# Patient Record
Sex: Male | Born: 1935
Health system: Southern US, Community
[De-identification: ages and names within clinical notes are randomized; demographics above are authoritative.]

## PROBLEM LIST (undated history)

## (undated) DIAGNOSIS — K589 Irritable bowel syndrome without diarrhea: Secondary | ICD-10-CM

## (undated) DIAGNOSIS — Z87898 Personal history of other specified conditions: Secondary | ICD-10-CM

## (undated) DIAGNOSIS — N320 Bladder-neck obstruction: Secondary | ICD-10-CM

## (undated) DIAGNOSIS — D649 Anemia, unspecified: Secondary | ICD-10-CM

## (undated) DIAGNOSIS — G629 Polyneuropathy, unspecified: Secondary | ICD-10-CM

## (undated) DIAGNOSIS — Z8719 Personal history of other diseases of the digestive system: Secondary | ICD-10-CM

## (undated) DIAGNOSIS — E538 Deficiency of other specified B group vitamins: Secondary | ICD-10-CM

## (undated) DIAGNOSIS — M8580 Other specified disorders of bone density and structure, unspecified site: Secondary | ICD-10-CM

## (undated) DIAGNOSIS — C801 Malignant (primary) neoplasm, unspecified: Secondary | ICD-10-CM

## (undated) DIAGNOSIS — J310 Chronic rhinitis: Secondary | ICD-10-CM

## (undated) DIAGNOSIS — F32A Depression, unspecified: Secondary | ICD-10-CM

## (undated) DIAGNOSIS — K219 Gastro-esophageal reflux disease without esophagitis: Secondary | ICD-10-CM

## (undated) DIAGNOSIS — I2510 Atherosclerotic heart disease of native coronary artery without angina pectoris: Secondary | ICD-10-CM

## (undated) DIAGNOSIS — E785 Hyperlipidemia, unspecified: Secondary | ICD-10-CM

## (undated) DIAGNOSIS — M1990 Unspecified osteoarthritis, unspecified site: Secondary | ICD-10-CM

## (undated) DIAGNOSIS — F329 Major depressive disorder, single episode, unspecified: Secondary | ICD-10-CM

## (undated) DIAGNOSIS — G2581 Restless legs syndrome: Secondary | ICD-10-CM

## (undated) HISTORY — DX: Atherosclerotic heart disease of native coronary artery without angina pectoris: I25.10

## (undated) HISTORY — PX: CHOLECYSTECTOMY: SHX55

## (undated) HISTORY — DX: Restless legs syndrome: G25.81

## (undated) HISTORY — DX: Polyneuropathy, unspecified: G62.9

## (undated) HISTORY — PX: CARPAL TUNNEL RELEASE: SHX101

## (undated) HISTORY — DX: Other specified disorders of bone density and structure, unspecified site: M85.80

## (undated) HISTORY — PX: TONSILLECTOMY: SUR1361

## (undated) HISTORY — DX: Irritable bowel syndrome, unspecified: K58.9

## (undated) HISTORY — DX: Chronic rhinitis: J31.0

## (undated) HISTORY — PX: APPENDECTOMY: SHX54

## (undated) HISTORY — DX: Deficiency of other specified B group vitamins: E53.8

---
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
## 2009-03-02 ENCOUNTER — Encounter: Payer: Self-pay | Admitting: Internal Medicine

## 2009-03-15 ENCOUNTER — Ambulatory Visit: Payer: Self-pay | Admitting: Internal Medicine

## 2009-03-15 ENCOUNTER — Inpatient Hospital Stay (HOSPITAL_COMMUNITY): Admission: EM | Admit: 2009-03-15 | Discharge: 2009-03-17 | Payer: Self-pay | Admitting: Emergency Medicine

## 2009-04-10 ENCOUNTER — Ambulatory Visit: Payer: Self-pay | Admitting: Internal Medicine

## 2009-04-10 DIAGNOSIS — E782 Mixed hyperlipidemia: Secondary | ICD-10-CM | POA: Insufficient documentation

## 2009-04-10 DIAGNOSIS — I10 Essential (primary) hypertension: Secondary | ICD-10-CM

## 2009-04-10 DIAGNOSIS — I2510 Atherosclerotic heart disease of native coronary artery without angina pectoris: Secondary | ICD-10-CM | POA: Insufficient documentation

## 2009-04-10 HISTORY — DX: Mixed hyperlipidemia: E78.2

## 2009-04-10 HISTORY — DX: Essential (primary) hypertension: I10

## 2009-04-27 ENCOUNTER — Ambulatory Visit: Payer: Self-pay | Admitting: Internal Medicine

## 2009-05-01 ENCOUNTER — Ambulatory Visit: Payer: Self-pay | Admitting: Internal Medicine

## 2009-05-25 LAB — CONVERTED CEMR LAB
HDL: 31.2 mg/dL — ABNORMAL LOW (ref >39.00)
LDL Cholesterol: 77 mg/dL (ref 0–99)

## 2009-07-20 ENCOUNTER — Encounter: Payer: Self-pay | Admitting: Internal Medicine

## 2009-09-21 ENCOUNTER — Encounter: Payer: Self-pay | Admitting: Internal Medicine

## 2009-10-12 ENCOUNTER — Ambulatory Visit: Payer: Self-pay | Admitting: Internal Medicine

## 2009-10-12 DIAGNOSIS — R42 Dizziness and giddiness: Secondary | ICD-10-CM | POA: Insufficient documentation

## 2010-02-02 ENCOUNTER — Encounter: Admission: RE | Admit: 2010-02-02 | Discharge: 2010-02-02 | Payer: Self-pay | Admitting: Neurology

## 2010-07-13 NOTE — Letter (Signed)
Summary: [facility] Office Note  [facility] Office Note   Imported By: Roderic Ovens 11/05/2009 15:19:46  _____________________________________________________________________  External Attachment:    Type:   Image     Comment:   External Document

## 2010-07-13 NOTE — Assessment & Plan Note (Signed)
Summary: Ryan Humphrey   Visit Type:  6 mos f/u Primary Provider:  Dr Felipa Furnace  CC:  shortness of breath.  History of Present Illness:  Patient is a 77 year old with a history of mild CAD by cath and dyslipidemia.  I recently saw him in the hospital.  He was admitted with chest pain, shoulder pain.  Cardiac catheterization was done that showed no critical lesions. Since he was seen he denies chest pain.  Does get short of breath when climbing stairs to attic.  Not at other times.  No organized exercise.  Busy with wife who has had a CVA and also has dementia. Note he had an episode of vertigo last night that was severe.  Had one other spell in past.  Seen by J. Love for this and other issues.  No other testing done.  Feels better now.    Problems Prior to Update: 1)  Hypertension, Benign  (ICD-401.1) 2)  Hyperlipidemia-mixed  (ICD-272.4) 3)  Coronary Atherosclerosis Native Coronary Artery  (ICD-414.01)  Current Medications (verified): 1)  Fosamax 70 Mg Tabs (Alendronate Sodium) .Marland Kitchen.. 1 Tab Q Weekly 2)  Aspirin 325 Mg  Tabs (Aspirin) .Marland Kitchen.. 1 Tab Once Daily 3)  Calcium Carbonate   Powd (Calcium Carbonate) .Marland Kitchen.. 1 Tab Two Times A Day 4)  Multivitamins   Tabs (Multiple Vitamin) .Marland Kitchen.. 1 Tab Once Daily 5)  Diclofenac Sodium 50 Mg Tbec (Diclofenac Sodium) .Marland Kitchen.. 1 Tab Two Times A Day 6)  Gabapentin 300 Mg Caps (Gabapentin) .... 2 Tablets Evrey Night 7)  Niaspan 1000 Mg Cr-Tabs (Niacin (Antihyperlipidemic)) .Marland Kitchen.. 1 Tab Once Daily 8)  Omeprazole 20 Mg Cpdr (Omeprazole) .Marland Kitchen.. 1 Atb Once Daily 9)  Oxybutynin Chloride 5 Mg Xr24h-Tab (Oxybutynin Chloride) .Marland Kitchen.. 1 Tab  Once Daily 10)  Simvastatin 20 Mg Tabs (Simvastatin) .Marland Kitchen.. 1 Tab Once Daily 11)  Venlafaxine Hcl 75 Mg Tabs (Venlafaxine Hcl) .... (Effexor) 1 Tab Two Times A Day 12)  Vitamin D 400 Unit  Tabs (Cholecalciferol) .... 2 Tabs Once Daily = 800iu Daily 13)  Nitrostat 0.4 Mg Subl (Nitroglycerin) .Marland Kitchen.. 1 Tablet Under Tongue At Onset of Chest Pain; You  May Repeat Every 5 Minutes For Up To 3 Doses.  Allergies: 1)  ! Pcn  Past History:  Past medical, surgical, family and social histories (including risk factors) reviewed, and no changes noted (except as noted below).  Past Medical History: CAD  Dyslipidemia Prostate cancer neuropathy vertigo  Past Surgical History: Reviewed history from 04/10/2009 and no changes required.   Cholecystectomy. 2005 Prostate surgery 1998 Carpal tunnel repair bilaterally 1991 Anal fissure surgery 1980 T & A  Family History: Reviewed history from 04/10/2009 and no changes required.  Mother died of old age 31.  Father died of angina, his   side of the family was significant for CAD.  The patient has 2 brothers who are alive and well  Social History: Reviewed history from 04/10/2009 and no changes required.  The patient is married, lives with his wife.  He is   retired.  Quit tobacco after 20 years of less than one pack per day   history.  Drinks occasionally.  No drug use.      Physical Exam  Additional Exam:  Patient is in NAD HEENT:  Normocephalic, atraumatic. EOMI, PERRLA.  Neck: JVP is normal. No thyromegaly. No bruits.  Lungs: clear to auscultation. No rales no wheezes.  Heart: Regular rate and rhythm. Normal S1, S2. No S3.   No significant murmurs. PMI  not displaced.  Abdomen:  Supple, nontender. Normal bowel sounds. No masses. No hepatomegaly.  Extremities:   Good distal pulses throughout. No lower extremity edema.  Musculoskeletal :moving all extremities.  Neuro:   alert and oriented x3.    Impression & Recommendations:  Problem # 1:  CORONARY ATHEROSCLEROSIS NATIVE CORONARY ARTERY (ICD-414.01) Doing well.  Mild disease by cath last fall.  Continue ASA and risk factor modification.  Encouraged him to stay active.  Problem # 2:  HYPERLIPIDEMIA-MIXED (ICD-272.4) Just had labs done about 4 wks ago at the [place].  will send results. His updated medication list for this problem  includes:    Niaspan 1000 Mg Cr-tabs (Niacin (antihyperlipidemic)) .Marland Kitchen... 1 tab once daily    Simvastatin 20 Mg Tabs (Simvastatin) .Marland Kitchen... 1 tab once daily  Problem # 3:  INTERMITTENT VERTIGO (ICD-780.4) will get records from J. Love.  Due to see him in August.  Patient Instructions: 1)  Will be available as needed.  Will get back withj you regarding lipids.  Appended Document: f41m/jss BP 140/ Pulse:  76.

## 2010-09-16 LAB — LIPID PANEL
Cholesterol: 148 mg/dL (ref 0–200)
HDL: 30 mg/dL — ABNORMAL LOW (ref >39)
LDL Cholesterol: 85 mg/dL (ref 0–99)

## 2010-09-16 LAB — CARDIAC PANEL(CRET KIN+CKTOT+MB+TROPI)
CK, MB: 1.3 ng/mL (ref 0.3–4.0)
Relative Index: INVALID (ref 0.0–2.5)
Total CK: 93 U/L (ref 7–232)
Troponin I: 0.01 ng/mL (ref 0.00–0.06)

## 2010-09-16 LAB — CBC
HCT: 38.5 % — ABNORMAL LOW (ref 39.0–52.0)
Hemoglobin: 13.1 g/dL (ref 13.0–17.0)
MCHC: 34 g/dL (ref 30.0–36.0)
Platelets: 142 10*3/uL — ABNORMAL LOW (ref 150–400)
RDW: 12.5 % (ref 11.5–15.5)
WBC: 6.2 10*3/uL (ref 4.0–10.5)

## 2010-09-16 LAB — BASIC METABOLIC PANEL
Calcium: 8.9 mg/dL (ref 8.4–10.5)
Chloride: 105 mEq/L (ref 96–112)
Creatinine, Ser: 1.06 mg/dL (ref 0.4–1.5)
GFR calc non Af Amer: >60 mL/min (ref >60)
Sodium: 141 mEq/L (ref 135–145)

## 2010-09-16 LAB — POCT I-STAT, CHEM 8
Calcium, Ion: 1.05 mmol/L — ABNORMAL LOW (ref 1.12–1.32)
Glucose, Bld: 82 mg/dL (ref 70–99)
Hemoglobin: 13.6 g/dL (ref 13.0–17.0)
Potassium: 4.3 mEq/L (ref 3.5–5.1)
Sodium: 140 mEq/L (ref 135–145)

## 2010-09-16 LAB — D-DIMER, QUANTITATIVE: D-Dimer, Quant: 0.31 ug/mL-FEU (ref 0.00–0.48)

## 2010-09-16 LAB — PROTIME-INR
INR: 1 (ref 0.00–1.49)
Prothrombin Time: 12.9 seconds (ref 11.6–15.2)

## 2010-09-16 LAB — CK TOTAL AND CKMB (NOT AT ARMC): Relative Index: 2 (ref 0.0–2.5)

## 2010-09-16 LAB — POCT CARDIAC MARKERS: Troponin i, poc: <0.05 ng/mL (ref 0.00–0.09)

## 2010-09-16 LAB — DIFFERENTIAL
Lymphs Abs: 1.1 10*3/uL (ref 0.7–4.0)
Monocytes Absolute: 0.8 10*3/uL (ref 0.1–1.0)
Neutro Abs: 4.1 10*3/uL (ref 1.7–7.7)

## 2010-09-16 LAB — TROPONIN I: Troponin I: 0.01 ng/mL (ref 0.00–0.06)

## 2010-09-17 ENCOUNTER — Emergency Department (HOSPITAL_COMMUNITY): Payer: Medicare Other

## 2010-09-17 ENCOUNTER — Emergency Department (HOSPITAL_COMMUNITY)
Admission: EM | Admit: 2010-09-17 | Discharge: 2010-09-17 | Disposition: A | Discharge status: Home / Self Care | Payer: Medicare Other | Attending: Emergency Medicine | Admitting: Emergency Medicine

## 2010-09-17 DIAGNOSIS — Z79899 Other long term (current) drug therapy: Secondary | ICD-10-CM | POA: Insufficient documentation

## 2010-09-17 DIAGNOSIS — Z7982 Long term (current) use of aspirin: Secondary | ICD-10-CM | POA: Insufficient documentation

## 2010-09-17 DIAGNOSIS — R42 Dizziness and giddiness: Secondary | ICD-10-CM | POA: Insufficient documentation

## 2010-09-17 DIAGNOSIS — Z8546 Personal history of malignant neoplasm of prostate: Secondary | ICD-10-CM | POA: Insufficient documentation

## 2010-09-17 LAB — BASIC METABOLIC PANEL WITH GFR
BUN: 22 mg/dL (ref 6–23)
CO2: 23 meq/L (ref 19–32)
Calcium: 8.8 mg/dL (ref 8.4–10.5)
Chloride: 105 meq/L (ref 96–112)
Creatinine, Ser: 1.02 mg/dL (ref 0.4–1.5)
GFR calc non Af Amer: >60 mL/min (ref >60)

## 2010-09-17 LAB — BASIC METABOLIC PANEL
GFR calc Af Amer: >60 mL/min (ref >60)
Glucose, Bld: 182 mg/dL — ABNORMAL HIGH (ref 70–99)
Potassium: 3.4 mEq/L — ABNORMAL LOW (ref 3.5–5.1)
Sodium: 136 mEq/L (ref 135–145)

## 2010-09-17 LAB — CBC
HCT: 35.5 % — ABNORMAL LOW (ref 39.0–52.0)
Hemoglobin: 12.2 g/dL — ABNORMAL LOW (ref 13.0–17.0)
MCH: 31.9 pg (ref 26.0–34.0)
MCHC: 34.4 g/dL (ref 30.0–36.0)
MCV: 92.9 fL (ref 78.0–100.0)
Platelets: 122 10*3/uL — ABNORMAL LOW (ref 150–400)
RBC: 3.82 MIL/uL — ABNORMAL LOW (ref 4.22–5.81)
RDW: 12.5 % (ref 11.5–15.5)
WBC: 7 K/uL (ref 4.0–10.5)

## 2010-09-17 LAB — URINALYSIS, ROUTINE W REFLEX MICROSCOPIC
Bilirubin Urine: NEGATIVE
Glucose, UA: NEGATIVE mg/dL
Hgb urine dipstick: NEGATIVE
Ketones, ur: 15 mg/dL — AB
Nitrite: NEGATIVE
Protein, ur: NEGATIVE mg/dL
Specific Gravity, Urine: 1.025 (ref 1.005–1.030)
Urobilinogen, UA: 1 mg/dL (ref 0.0–1.0)
pH: 6 (ref 5.0–8.0)

## 2010-09-17 LAB — DIFFERENTIAL
Basophils Absolute: 0 K/uL (ref 0.0–0.1)
Basophils Relative: 0 % (ref 0–1)
Eosinophils Absolute: 0 10*3/uL (ref 0.0–0.7)
Eosinophils Relative: 1 % (ref 0–5)
Lymphocytes Relative: 11 % — ABNORMAL LOW (ref 12–46)
Lymphs Abs: 0.8 10*3/uL (ref 0.7–4.0)
Monocytes Absolute: 0.3 10*3/uL (ref 0.1–1.0)
Monocytes Relative: 4 % (ref 3–12)
Neutro Abs: 5.9 K/uL (ref 1.7–7.7)
Neutrophils Relative %: 84 % — ABNORMAL HIGH (ref 43–77)

## 2010-09-17 LAB — POCT CARDIAC MARKERS
CKMB, poc: <1 ng/mL — ABNORMAL LOW (ref 1.0–8.0)
Myoglobin, poc: 55.7 ng/mL (ref 12–200)
Myoglobin, poc: 68 ng/mL (ref 12–200)
Troponin i, poc: <0.05 ng/mL (ref 0.00–0.09)

## 2011-03-28 DIAGNOSIS — N320 Bladder-neck obstruction: Secondary | ICD-10-CM

## 2011-03-28 DIAGNOSIS — C61 Malignant neoplasm of prostate: Secondary | ICD-10-CM | POA: Insufficient documentation

## 2011-03-28 HISTORY — DX: Bladder-neck obstruction: N32.0

## 2011-08-04 DIAGNOSIS — K589 Irritable bowel syndrome without diarrhea: Secondary | ICD-10-CM | POA: Diagnosis not present

## 2011-08-15 DIAGNOSIS — M48061 Spinal stenosis, lumbar region without neurogenic claudication: Secondary | ICD-10-CM | POA: Diagnosis not present

## 2011-08-15 DIAGNOSIS — M999 Biomechanical lesion, unspecified: Secondary | ICD-10-CM | POA: Diagnosis not present

## 2011-08-15 DIAGNOSIS — M47814 Spondylosis without myelopathy or radiculopathy, thoracic region: Secondary | ICD-10-CM | POA: Diagnosis not present

## 2011-08-15 DIAGNOSIS — M5430 Sciatica, unspecified side: Secondary | ICD-10-CM | POA: Diagnosis not present

## 2011-08-16 DIAGNOSIS — M47814 Spondylosis without myelopathy or radiculopathy, thoracic region: Secondary | ICD-10-CM | POA: Diagnosis not present

## 2011-08-16 DIAGNOSIS — M48061 Spinal stenosis, lumbar region without neurogenic claudication: Secondary | ICD-10-CM | POA: Diagnosis not present

## 2011-08-16 DIAGNOSIS — M999 Biomechanical lesion, unspecified: Secondary | ICD-10-CM | POA: Diagnosis not present

## 2011-08-16 DIAGNOSIS — M5430 Sciatica, unspecified side: Secondary | ICD-10-CM | POA: Diagnosis not present

## 2011-08-17 DIAGNOSIS — L719 Rosacea, unspecified: Secondary | ICD-10-CM | POA: Diagnosis not present

## 2011-08-17 DIAGNOSIS — H2510 Age-related nuclear cataract, unspecified eye: Secondary | ICD-10-CM | POA: Diagnosis not present

## 2011-08-22 DIAGNOSIS — M5430 Sciatica, unspecified side: Secondary | ICD-10-CM | POA: Diagnosis not present

## 2011-08-22 DIAGNOSIS — M48061 Spinal stenosis, lumbar region without neurogenic claudication: Secondary | ICD-10-CM | POA: Diagnosis not present

## 2011-08-22 DIAGNOSIS — M999 Biomechanical lesion, unspecified: Secondary | ICD-10-CM | POA: Diagnosis not present

## 2011-08-22 DIAGNOSIS — M47814 Spondylosis without myelopathy or radiculopathy, thoracic region: Secondary | ICD-10-CM | POA: Diagnosis not present

## 2011-08-24 DIAGNOSIS — M999 Biomechanical lesion, unspecified: Secondary | ICD-10-CM | POA: Diagnosis not present

## 2011-08-24 DIAGNOSIS — M48061 Spinal stenosis, lumbar region without neurogenic claudication: Secondary | ICD-10-CM | POA: Diagnosis not present

## 2011-08-24 DIAGNOSIS — M5430 Sciatica, unspecified side: Secondary | ICD-10-CM | POA: Diagnosis not present

## 2011-08-24 DIAGNOSIS — M47814 Spondylosis without myelopathy or radiculopathy, thoracic region: Secondary | ICD-10-CM | POA: Diagnosis not present

## 2011-08-26 DIAGNOSIS — M48061 Spinal stenosis, lumbar region without neurogenic claudication: Secondary | ICD-10-CM | POA: Diagnosis not present

## 2011-08-26 DIAGNOSIS — M47814 Spondylosis without myelopathy or radiculopathy, thoracic region: Secondary | ICD-10-CM | POA: Diagnosis not present

## 2011-08-26 DIAGNOSIS — M5430 Sciatica, unspecified side: Secondary | ICD-10-CM | POA: Diagnosis not present

## 2011-08-26 DIAGNOSIS — M999 Biomechanical lesion, unspecified: Secondary | ICD-10-CM | POA: Diagnosis not present

## 2011-08-29 DIAGNOSIS — M999 Biomechanical lesion, unspecified: Secondary | ICD-10-CM | POA: Diagnosis not present

## 2011-08-29 DIAGNOSIS — M47814 Spondylosis without myelopathy or radiculopathy, thoracic region: Secondary | ICD-10-CM | POA: Diagnosis not present

## 2011-08-29 DIAGNOSIS — M5430 Sciatica, unspecified side: Secondary | ICD-10-CM | POA: Diagnosis not present

## 2011-08-29 DIAGNOSIS — M48061 Spinal stenosis, lumbar region without neurogenic claudication: Secondary | ICD-10-CM | POA: Diagnosis not present

## 2011-08-31 DIAGNOSIS — M48061 Spinal stenosis, lumbar region without neurogenic claudication: Secondary | ICD-10-CM | POA: Diagnosis not present

## 2011-08-31 DIAGNOSIS — M47814 Spondylosis without myelopathy or radiculopathy, thoracic region: Secondary | ICD-10-CM | POA: Diagnosis not present

## 2011-08-31 DIAGNOSIS — M999 Biomechanical lesion, unspecified: Secondary | ICD-10-CM | POA: Diagnosis not present

## 2011-08-31 DIAGNOSIS — M5430 Sciatica, unspecified side: Secondary | ICD-10-CM | POA: Diagnosis not present

## 2011-09-02 DIAGNOSIS — M48061 Spinal stenosis, lumbar region without neurogenic claudication: Secondary | ICD-10-CM | POA: Diagnosis not present

## 2011-09-02 DIAGNOSIS — M5430 Sciatica, unspecified side: Secondary | ICD-10-CM | POA: Diagnosis not present

## 2011-09-02 DIAGNOSIS — M999 Biomechanical lesion, unspecified: Secondary | ICD-10-CM | POA: Diagnosis not present

## 2011-09-02 DIAGNOSIS — M47814 Spondylosis without myelopathy or radiculopathy, thoracic region: Secondary | ICD-10-CM | POA: Diagnosis not present

## 2011-09-05 DIAGNOSIS — M5430 Sciatica, unspecified side: Secondary | ICD-10-CM | POA: Diagnosis not present

## 2011-09-05 DIAGNOSIS — M47814 Spondylosis without myelopathy or radiculopathy, thoracic region: Secondary | ICD-10-CM | POA: Diagnosis not present

## 2011-09-05 DIAGNOSIS — M999 Biomechanical lesion, unspecified: Secondary | ICD-10-CM | POA: Diagnosis not present

## 2011-09-05 DIAGNOSIS — M48061 Spinal stenosis, lumbar region without neurogenic claudication: Secondary | ICD-10-CM | POA: Diagnosis not present

## 2011-09-07 DIAGNOSIS — M999 Biomechanical lesion, unspecified: Secondary | ICD-10-CM | POA: Diagnosis not present

## 2011-09-07 DIAGNOSIS — M48061 Spinal stenosis, lumbar region without neurogenic claudication: Secondary | ICD-10-CM | POA: Diagnosis not present

## 2011-09-07 DIAGNOSIS — M5430 Sciatica, unspecified side: Secondary | ICD-10-CM | POA: Diagnosis not present

## 2011-09-07 DIAGNOSIS — M47814 Spondylosis without myelopathy or radiculopathy, thoracic region: Secondary | ICD-10-CM | POA: Diagnosis not present

## 2011-09-12 DIAGNOSIS — M5430 Sciatica, unspecified side: Secondary | ICD-10-CM | POA: Diagnosis not present

## 2011-09-12 DIAGNOSIS — M47814 Spondylosis without myelopathy or radiculopathy, thoracic region: Secondary | ICD-10-CM | POA: Diagnosis not present

## 2011-09-12 DIAGNOSIS — M48061 Spinal stenosis, lumbar region without neurogenic claudication: Secondary | ICD-10-CM | POA: Diagnosis not present

## 2011-09-12 DIAGNOSIS — M999 Biomechanical lesion, unspecified: Secondary | ICD-10-CM | POA: Diagnosis not present

## 2011-09-14 DIAGNOSIS — M999 Biomechanical lesion, unspecified: Secondary | ICD-10-CM | POA: Diagnosis not present

## 2011-09-14 DIAGNOSIS — M48061 Spinal stenosis, lumbar region without neurogenic claudication: Secondary | ICD-10-CM | POA: Diagnosis not present

## 2011-09-14 DIAGNOSIS — M47814 Spondylosis without myelopathy or radiculopathy, thoracic region: Secondary | ICD-10-CM | POA: Diagnosis not present

## 2011-09-14 DIAGNOSIS — M5430 Sciatica, unspecified side: Secondary | ICD-10-CM | POA: Diagnosis not present

## 2011-09-19 DIAGNOSIS — M5430 Sciatica, unspecified side: Secondary | ICD-10-CM | POA: Diagnosis not present

## 2011-09-19 DIAGNOSIS — M48061 Spinal stenosis, lumbar region without neurogenic claudication: Secondary | ICD-10-CM | POA: Diagnosis not present

## 2011-09-19 DIAGNOSIS — M47814 Spondylosis without myelopathy or radiculopathy, thoracic region: Secondary | ICD-10-CM | POA: Diagnosis not present

## 2011-09-19 DIAGNOSIS — M999 Biomechanical lesion, unspecified: Secondary | ICD-10-CM | POA: Diagnosis not present

## 2011-09-26 DIAGNOSIS — N320 Bladder-neck obstruction: Secondary | ICD-10-CM | POA: Diagnosis not present

## 2011-09-26 DIAGNOSIS — C61 Malignant neoplasm of prostate: Secondary | ICD-10-CM | POA: Diagnosis not present

## 2011-10-03 DIAGNOSIS — M5430 Sciatica, unspecified side: Secondary | ICD-10-CM | POA: Diagnosis not present

## 2011-10-03 DIAGNOSIS — M999 Biomechanical lesion, unspecified: Secondary | ICD-10-CM | POA: Diagnosis not present

## 2011-10-03 DIAGNOSIS — M48061 Spinal stenosis, lumbar region without neurogenic claudication: Secondary | ICD-10-CM | POA: Diagnosis not present

## 2011-10-03 DIAGNOSIS — M47814 Spondylosis without myelopathy or radiculopathy, thoracic region: Secondary | ICD-10-CM | POA: Diagnosis not present

## 2011-12-23 DIAGNOSIS — M542 Cervicalgia: Secondary | ICD-10-CM | POA: Diagnosis not present

## 2011-12-23 DIAGNOSIS — G609 Hereditary and idiopathic neuropathy, unspecified: Secondary | ICD-10-CM | POA: Diagnosis not present

## 2012-01-05 DIAGNOSIS — Z79899 Other long term (current) drug therapy: Secondary | ICD-10-CM | POA: Diagnosis not present

## 2012-01-05 DIAGNOSIS — F39 Unspecified mood [affective] disorder: Secondary | ICD-10-CM | POA: Diagnosis not present

## 2012-01-05 DIAGNOSIS — Z0000 Encounter for general adult medical examination without abnormal findings: Secondary | ICD-10-CM | POA: Diagnosis not present

## 2012-01-05 DIAGNOSIS — I2510 Atherosclerotic heart disease of native coronary artery without angina pectoris: Secondary | ICD-10-CM | POA: Diagnosis not present

## 2012-02-07 DIAGNOSIS — D239 Other benign neoplasm of skin, unspecified: Secondary | ICD-10-CM | POA: Diagnosis not present

## 2012-02-07 DIAGNOSIS — L570 Actinic keratosis: Secondary | ICD-10-CM | POA: Diagnosis not present

## 2012-03-17 DIAGNOSIS — Z23 Encounter for immunization: Secondary | ICD-10-CM | POA: Diagnosis not present

## 2012-04-02 DIAGNOSIS — N320 Bladder-neck obstruction: Secondary | ICD-10-CM | POA: Diagnosis not present

## 2012-04-02 DIAGNOSIS — C61 Malignant neoplasm of prostate: Secondary | ICD-10-CM | POA: Diagnosis not present

## 2012-04-18 DIAGNOSIS — L719 Rosacea, unspecified: Secondary | ICD-10-CM | POA: Diagnosis not present

## 2012-06-25 DIAGNOSIS — M19049 Primary osteoarthritis, unspecified hand: Secondary | ICD-10-CM | POA: Diagnosis not present

## 2012-08-22 DIAGNOSIS — H2510 Age-related nuclear cataract, unspecified eye: Secondary | ICD-10-CM | POA: Diagnosis not present

## 2012-08-22 DIAGNOSIS — L719 Rosacea, unspecified: Secondary | ICD-10-CM | POA: Diagnosis not present

## 2012-10-01 DIAGNOSIS — N320 Bladder-neck obstruction: Secondary | ICD-10-CM | POA: Diagnosis not present

## 2012-10-01 DIAGNOSIS — C61 Malignant neoplasm of prostate: Secondary | ICD-10-CM | POA: Diagnosis not present

## 2012-10-04 ENCOUNTER — Encounter (HOSPITAL_COMMUNITY): Payer: Self-pay | Admitting: *Deleted

## 2012-10-04 ENCOUNTER — Other Ambulatory Visit: Payer: Self-pay

## 2012-10-04 ENCOUNTER — Emergency Department (HOSPITAL_COMMUNITY)
Admission: EM | Admit: 2012-10-04 | Discharge: 2012-10-04 | Disposition: A | Discharge status: Home / Self Care | Payer: Medicare Other | Attending: Emergency Medicine | Admitting: Emergency Medicine

## 2012-10-04 DIAGNOSIS — D649 Anemia, unspecified: Secondary | ICD-10-CM | POA: Diagnosis not present

## 2012-10-04 DIAGNOSIS — G629 Polyneuropathy, unspecified: Secondary | ICD-10-CM

## 2012-10-04 DIAGNOSIS — R209 Unspecified disturbances of skin sensation: Secondary | ICD-10-CM | POA: Diagnosis not present

## 2012-10-04 DIAGNOSIS — K922 Gastrointestinal hemorrhage, unspecified: Secondary | ICD-10-CM | POA: Diagnosis not present

## 2012-10-04 DIAGNOSIS — Z7982 Long term (current) use of aspirin: Secondary | ICD-10-CM | POA: Insufficient documentation

## 2012-10-04 DIAGNOSIS — M7989 Other specified soft tissue disorders: Secondary | ICD-10-CM | POA: Diagnosis not present

## 2012-10-04 DIAGNOSIS — E785 Hyperlipidemia, unspecified: Secondary | ICD-10-CM | POA: Diagnosis not present

## 2012-10-04 DIAGNOSIS — G589 Mononeuropathy, unspecified: Secondary | ICD-10-CM | POA: Insufficient documentation

## 2012-10-04 DIAGNOSIS — Z87891 Personal history of nicotine dependence: Secondary | ICD-10-CM | POA: Diagnosis not present

## 2012-10-04 DIAGNOSIS — M62838 Other muscle spasm: Secondary | ICD-10-CM | POA: Insufficient documentation

## 2012-10-04 DIAGNOSIS — Z79899 Other long term (current) drug therapy: Secondary | ICD-10-CM | POA: Diagnosis not present

## 2012-10-04 HISTORY — DX: Hyperlipidemia, unspecified: E78.5

## 2012-10-04 HISTORY — DX: Polyneuropathy, unspecified: G62.9

## 2012-10-04 LAB — POCT I-STAT, CHEM 8
Calcium, Ion: 1.17 mmol/L (ref 1.13–1.30)
Creatinine, Ser: 1.1 mg/dL (ref 0.50–1.35)
Glucose, Bld: 95 mg/dL (ref 70–99)
HCT: 32 % — ABNORMAL LOW (ref 39.0–52.0)
Hemoglobin: 10.9 g/dL — ABNORMAL LOW (ref 13.0–17.0)
Potassium: 4.8 mEq/L (ref 3.5–5.1)
TCO2: 27 mmol/L (ref 0–100)

## 2012-10-04 MED ORDER — DIAZEPAM 5 MG PO TABS: ORAL_TABLET | ORAL | Status: DC

## 2012-10-04 MED ORDER — DIAZEPAM 2 MG PO TABS
2.0000 mg | ORAL_TABLET | Freq: Once | ORAL | Status: AC
  Administered 2012-10-04: 2 mg via ORAL
  Filled 2012-10-04: qty 1

## 2012-10-04 NOTE — ED Provider Notes (Signed)
History     CSN: [id number]  Arrival date & time 10/04/12  0217   First MD Initiated Contact with Patient 10/04/12 [phone]      Chief Complaint  Patient presents with  . Leg Pain  . Tingling    bil leg    (Consider location/radiation/quality/duration/timing/severity/associated sxs/prior treatment) HPI Comments: Patient with history of bilateral neuropathy in feet presents with complaint of acute leg pain bilaterally with increased numbness and tingling. Patient states that the right leg had pain described as a tightness it is associated with "jerking of the leg". He states that it was in the entire leg. Pain is now much improved. Patient was able to ambulate during episode. No treatments prior to arrival. No lower back pain, urinary retention or bowel incontinence, recent fevers. Patient takes gabapentin for neuropathy. Patient reports episodes of diarrhea for the past week. Patient had chest pain in emergency department similar to previous pain from "hiatal hernia". Onset of symptoms acute. Course is improving. Nothing makes symptoms better or worse.  Patient is a 79 y.o. male presenting with leg pain. The history is provided by the patient and a relative.  Leg Pain   Past Medical History  Diagnosis Date  . Neuropathy   . Hyperlipidemia     History reviewed. No pertinent past surgical history.  No family history on file.  History  Substance Use Topics  . Smoking status: Former Games developer  . Smokeless tobacco: Not on file  . Alcohol Use: Yes      Review of Systems  All other systems reviewed and are negative.    Allergies  Penicillins  Home Medications   Current Outpatient Rx  Name  Route  Sig  Dispense  Refill  . aspirin 325 MG tablet   Oral   Take 325 mg by mouth daily.         . Boric Acid POWD   Both Eyes   Place 15 mLs into both eyes 3 (three) times a week. Mix 1 tablespoon of boric acid with a quart of water & rinse eye three times a week for granulated  eye lids.         . Calcium Carbonate-Vitamin D (CALTRATE 600+D PO)   Oral   Take 1 tablet by mouth 2 (two) times daily.         . diclofenac (VOLTAREN) 75 MG EC tablet   Oral   Take 75 mg by mouth 2 (two) times daily.         Marland Kitchen gabapentin (NEURONTIN) 300 MG capsule   Oral   Take 300 mg by mouth 2 (two) times daily.         . Multiple Vitamin (MULTIVITAMIN WITH MINERALS) TABS   Oral   Take 1 tablet by mouth daily.         . niacin (NIASPAN) 1000 MG CR tablet   Oral   Take 1,000 mg by mouth at bedtime.         Bertram Gala Glycol-Propyl Glycol (SYSTANE PRESERVATIVE FREE OP)   Ophthalmic   Apply 1 drop to eye 3 (three) times a week. For dry & granulated eyes.         Marland Kitchen sertraline (ZOLOFT) 50 MG tablet   Oral   Take 50 mg by mouth every morning.         . simvastatin (ZOCOR) 20 MG tablet   Oral   Take 20 mg by mouth every evening.         Marland Kitchen  VITAMIN D, ERGOCALCIFEROL, PO   Oral   Take 800 Units by mouth daily.           BP 138/71  Pulse 82  Temp(Src) 97.5 F (36.4 C) (Oral)  Resp 15  Wt 170 lb (77.111 kg)  BMI 23.72 kg/m2  SpO2 97%  Physical Exam  Nursing note and vitals reviewed. Constitutional: He appears well-developed and well-nourished.  HENT:  Head: Normocephalic and atraumatic.  Eyes: Conjunctivae are normal. Right eye exhibits no discharge. Left eye exhibits no discharge.  Neck: Normal range of motion. Neck supple.  Cardiovascular: Normal rate, regular rhythm and normal heart sounds.   Pulses:      Posterior tibial pulses are 2+ on the right side, and 2+ on the left side.  No calf pain or swelling.  Pulmonary/Chest: Effort normal and breath sounds normal.  Abdominal: Soft. There is no tenderness.  Musculoskeletal: He exhibits no edema and no tenderness.  Neurological: He is alert.  Skin: Skin is warm and dry.  Psychiatric: He has a normal mood and affect.    ED Course  Procedures (including critical care time)  Labs  Reviewed  POCT I-STAT, CHEM 8 - Abnormal; Notable for the following:    BUN 28 (*)    Hemoglobin 10.9 (*)    HCT 32.0 (*)    All other components within normal limits  OCCULT BLOOD, POC DEVICE - Abnormal; Notable for the following:    Fecal Occult Bld POSITIVE (*)    All other components within normal limits  MAGNESIUM   No results found.   1. Muscle spasm   2. Neuropathy   3. Anemia   4. GI bleeding     Patient seen and examined. Work-up initiated.   Vital signs reviewed and are as follows: Filed Vitals:   10/04/12 0520  BP: 138/71  Pulse: 82  Temp:   Resp: 15    Date: 10/04/2012  Rate: 58  Rhythm: normal sinus rhythm  QRS Axis: right  Intervals: normal  ST/T Wave abnormalities: normal  Conduction Disutrbances:right bundle branch block  Narrative Interpretation:   Old EKG Reviewed: unchanged from 09/2010   Patient d/w and seen by Dr. Rulon Abide. Patient had spasm while Dr. Rulon Abide in room. He ordered valium.   Given lower hgb, hemoccult performed with nurse chaperone (Joss).   Patient informed of results. Will ambulate after valium, d/c to home with Valium.   6:29 AM Hemoccult positive. Will need this followed by PCP/GI.   6:52 AM patient has ambulated well. Patient and daughter informed of all results. Instructed on precautions with use of Valium at home. Informed of fall risk.  Patient to followup with PCP regarding symptoms as well as positive Hemoccult.   MDM  Patient with muscle cramps/spasms in legs. Treated successfully in ED with Valium. Patient ambulatory, minimal side effects. Electrolytes normal.  GI bleeding. Mild anemia. This can be followed by PCP.  Chest pain likely related to reflux. This is resolved and was minimal in ED. Do not suspect ACS.       Renne Crigler, PA-C 10/04/12 [phone]

## 2012-10-04 NOTE — ED Notes (Signed)
Bonk, MD updated on pt status- ekg given for review

## 2012-10-04 NOTE — ED Notes (Signed)
Rx x 1 for valium given- pt has a ride- ambulatory without need for assist at d/c

## 2012-10-04 NOTE — ED Notes (Signed)
Bonk MD at bedside.

## 2012-10-04 NOTE — ED Notes (Signed)
EKG complete- pt states pain is now gone

## 2012-10-04 NOTE — ED Notes (Signed)
Pt has history of neuropathy  in legs, tonight increased tingling and pain in both legs

## 2012-10-04 NOTE — ED Provider Notes (Signed)
Medical screening examination/treatment/procedure(s) were conducted as a shared visit with non-physician practitioner(s) and myself.  I personally evaluated the patient during the encounter Jones Skene, M.D.  Ryan Humphrey is a 79 y.o. male history bilateral lower extremity neuropathy, presents tonight with descending numbness and tingling that has since resolved, this is moderate to severe, he did nothing for it just resolved spontaneously. Is also associated with tightness or a jerking of the lower extremities. Says this was similar to cramps in the past.  Patient had no weakness, no falls, no antecedent trauma. Patient is taking gabapentin for neuropathy. Patient has had some chronic diarrhea which stopped about a week ago. No nausea vomiting, patient had one episode of transient less than 1 minute aching chest pain in the center of his chest while in the emergency department this spontaneously resolved, he says he's had this pain before but it was related to hiatal hernia.  Patient denies any fevers or chills, changes in vision, earache, sore throat, neck pain or stiffness, chest pressure, palpitations, syncope, dyspnea, cough, wheezing,  abdominal pain, nausea, vomiting, melena, red bloody stools, frequency, dysuria, myalgias, back pain, recent trauma, rash, itching, skin lesions, easy bruising or bleeding, headache, weakness.   VITAL SIGNS:   Filed Vitals:   10/04/12 0659  BP: 141/75  Pulse: 89  Temp:   Resp: 18   CONSTITUTIONAL: Awake, oriented, appears non-toxic HENT: Atraumatic, normocephalic, oral mucosa pink and moist, airway patent. Nares patent without drainage. External ears normal. EYES: Conjunctiva clear, EOMI, PERRLA NECK: Trachea midline, non-tender, supple CARDIOVASCULAR: Normal heart rate, Normal rhythm, No murmurs, rubs, gallops PULMONARY/CHEST: Clear to auscultation, no rhonchi, wheezes, or rales. Symmetrical breath sounds. Non-tender. ABDOMINAL: Non-distended, soft,  non-tender - no rebound or guarding.  BS normal. NEUROLOGIC: WU:[id number] fields intact. PERRLA, EOMI.  Facial sensation equal to light touch bilaterally.  Good muscle bulk in the masseter muscle and good lateral movement of the jaw.  Facial expressions equal and good strength with smile/frown and puffed cheeks.  Hearing grossly intact to finger rub test.  Uvula, tongue are midline with no deviation. Symmetrical palate elevation.  Trapezius and SCM muscles are 5/5 strength bilaterally.   DTR: Brachioradialis, biceps, pectoralis, 3+ bilaterally. Patellar, Achilles tendon reflexes +. No clonus. Strength: 5/5 strength flexors and extensors in the upper and lower extremities.  Grip strength, finger adduction/abduction 5/5. Sensation: Sensation intact distally to light touch Cerebellar: No ataxia with walking or dysmetria with finger to nose, rapid alternating hand movements and heels to shin testing. Gait and Station: Normal gait.  Negative Romberg, no pronator drift EXTREMITIES: No clubbing, cyanosis, or edema SKIN: Warm, Dry, No erythema, No rash  MDM: Patient had a soleus cramp of the left lower extremity while I was examining him, this was stretched out. Will give patient 2 mg of Valium and re-ambulate. Patient has diminished reflexes in the lower extremities however given that he has some neuropathy this is consistent with a lower motor neuron problem likely his neuropathy, does not signify an upper motor neuron problem or stroke. Patient is ambulatory. Patient is back to baseline. Patient does have a neurologist whom he will followup with. Do not think he's got a cord compression syndrome at this time such as epidural abscess, slipped disc, cauda equina syndrome.   Jones Skene, MD 10/04/12 2154

## 2012-10-16 DIAGNOSIS — E538 Deficiency of other specified B group vitamins: Secondary | ICD-10-CM | POA: Diagnosis not present

## 2012-10-17 DIAGNOSIS — D510 Vitamin B12 deficiency anemia due to intrinsic factor deficiency: Secondary | ICD-10-CM | POA: Diagnosis not present

## 2012-10-18 DIAGNOSIS — D510 Vitamin B12 deficiency anemia due to intrinsic factor deficiency: Secondary | ICD-10-CM | POA: Diagnosis not present

## 2012-10-22 ENCOUNTER — Ambulatory Visit (INDEPENDENT_AMBULATORY_CARE_PROVIDER_SITE_OTHER): Payer: Medicare Other | Admitting: Diagnostic Neuroimaging

## 2012-10-22 ENCOUNTER — Encounter: Payer: Self-pay | Admitting: Diagnostic Neuroimaging

## 2012-10-22 VITALS — BP 125/70 | HR 94 | Temp 98.0°F | Ht 70.5 in | Wt 172.0 lb

## 2012-10-22 DIAGNOSIS — G589 Mononeuropathy, unspecified: Secondary | ICD-10-CM | POA: Diagnosis not present

## 2012-10-22 DIAGNOSIS — M79604 Pain in right leg: Secondary | ICD-10-CM

## 2012-10-22 DIAGNOSIS — M79605 Pain in left leg: Secondary | ICD-10-CM

## 2012-10-22 DIAGNOSIS — G629 Polyneuropathy, unspecified: Secondary | ICD-10-CM

## 2012-10-22 DIAGNOSIS — M48061 Spinal stenosis, lumbar region without neurogenic claudication: Secondary | ICD-10-CM | POA: Diagnosis not present

## 2012-10-22 DIAGNOSIS — M79609 Pain in unspecified limb: Secondary | ICD-10-CM | POA: Diagnosis not present

## 2012-10-22 DIAGNOSIS — E538 Deficiency of other specified B group vitamins: Secondary | ICD-10-CM | POA: Diagnosis not present

## 2012-10-22 NOTE — Progress Notes (Signed)
[facility]  PATIENT: Ryan Humphrey DOB: 10-Aug-1928  REFERRING CLINICIAN:  HISTORY FROM: patient REASON FOR VISIT: follow up   HISTORICAL  CHIEF COMPLAINT:  Chief Complaint  Patient presents with  . Follow-up    HISTORY OF PRESENT ILLNESS:   UPDATE 10/22/12: Since last visit patient is here for followup of after going to the emergency room with painful spasms and numbness in lower extremities. This happened one night at 1:00 in the morning while he was laying in bed. Patient stood up and walked around and symptoms slightly relief. When he laid back down symptoms returned.this happened on 10/04/12, patient went to [facility] Emergency room and evaluated. Symptoms improved with valium. Some residual lower extremity numbness.  PRIOR HPI (12/23/11, Dr. Sandria Manly): 79 year old right-handed white widowed male from [place], [place] with a five year history of nonradiating neck pain, mostly on the right side of his neck but also on the left side of his neck, not associated with numbness in his hands. He has felt a" grating feeling" around his head and neck and skull with excruciating pain at times going towards his ears. Aleve and acetaminophen were without benefit. Injections by Dr. Regino Schultze were of no benefit. Injections at the [facility] pain clinic were of no benefit. He has responded well to diclofenac 75 mg twice per day but because of potential long-term side effects I cut him to 50 mg twice per day. He developed a lumbar spine pain radiating into his left leg and underwent physical therapy for one month. This was excruciating pain  that goes down the back of  the leg to the foot. He has numbness of the toes of both feet. He has rare bladder incontinence and has had surgery for prostate cancer and external radiation and hormone therapy for 4 years.He has had diarrhea with occasional bowel incontinence.   His  diclofenac was increased to 50 mg t.i.d. with improvement in back pain He   has a history of lightheaded sensation and movement. He developes nausea and vomiting for one and a  half hours.He does not have  tinnitus or loss of hearing. He  underwent a cardiac catheterization  October 2011 and is  followed by Dr. Tenny Craw, cardiologist. He had vertigo, but none since last year. He has tinnitus and continued symptoms of irritable bowel syndrome. He has difficulty getting his toes warm during the wintertime. He does much better "warm weather" .He has cramps in his feet at night. He is using a new step machine 5 days per week for 30 minutes each session to exercise.     REVIEW OF SYSTEMS: Full 14 system review of systems performed and notable only for ringing in ears blood in stool diarrhea impotence Raynaud's cramps feeling cold anemia memory loss numbness dizziness tremor restless legs.  ALLERGIES: Allergies  Allergen Reactions  . Penicillins Other (See Comments)    Unknown reaction    HOME MEDICATIONS: Outpatient Prescriptions Prior to Visit  Medication Sig Dispense Refill  . aspirin 325 MG tablet Take 325 mg by mouth daily.      . Calcium Carbonate-Vitamin D (CALTRATE 600+D PO) Take 1 tablet by mouth 2 (two) times daily.      . diazepam (VALIUM) 5 MG tablet Take 1 tablet every 8 hours as needed for muscle spasm  10 tablet  0  . diclofenac (VOLTAREN) 75 MG EC tablet Take 75 mg by mouth 2 (two) times daily.      Marland Kitchen gabapentin (NEURONTIN) 300 MG  capsule Take 300 mg by mouth 2 (two) times daily.      . Multiple Vitamin (MULTIVITAMIN WITH MINERALS) TABS Take 1 tablet by mouth daily.      . niacin (NIASPAN) 1000 MG CR tablet Take 1,000 mg by mouth at bedtime.      Bertram Gala Glycol-Propyl Glycol (SYSTANE PRESERVATIVE FREE OP) Apply 1 drop to eye 3 (three) times a week. For dry & granulated eyes.      Marland Kitchen sertraline (ZOLOFT) 50 MG tablet Take 50 mg by mouth every morning.      . simvastatin (ZOCOR) 20 MG tablet Take 20 mg by mouth every evening.      Marland Kitchen VITAMIN D, ERGOCALCIFEROL,  PO Take 800 Units by mouth daily.      . Boric Acid POWD Place 15 mLs into both eyes 3 (three) times a week. Mix 1 tablespoon of boric acid with a quart of water & rinse eye three times a week for granulated eye lids.       No facility-administered medications prior to visit.    PAST MEDICAL HISTORY: Past Medical History  Diagnosis Date  . Neuropathy   . Hyperlipidemia     PAST SURGICAL HISTORY: Past Surgical History  Procedure Laterality Date  . Appendectomy    . Cholecystectomy    . Carpal tunnel release      both hands    FAMILY HISTORY: Family History  Problem Relation Age of Onset  . Memory loss Mother     SOCIAL HISTORY:  History   Social History  . Marital Status: Widowed    Spouse Name: N/A    Number of Children: 2  . Years of Education: College   Occupational History  . Retired    Social History Main Topics  . Smoking status: Former Smoker -- 0.50 packs/day for 30 years    Quit date: 06/14/1963  . Smokeless tobacco: Never Used  . Alcohol Use: Yes     Comment: 1-2 drinks nightly  . Drug Use: No  . Sexually Active: Not on file   Other Topics Concern  . Not on file   Social History Narrative   Pt lives at home alone. Plays the piano.   Caffeine Use: Rarely     PHYSICAL EXAM  Filed Vitals:   10/22/12 1522  BP: 125/70  Pulse: 94  Temp: 98 F (36.7 C)  TempSrc: Oral  Height: 5' 10.5" (1.791 m)  Weight: 172 lb (78.019 kg)   Body mass index is 24.32 kg/(m^2).  GENERAL EXAM: Patient is in no distress  CARDIOVASCULAR: Regular rate and rhythm, no murmurs, no carotid bruits  NEUROLOGIC: MENTAL STATUS: awake, alert, language fluent, comprehension intact, naming intact CRANIAL NERVE: pupils equal and reactive to light, visual fields full to confrontation, extraocular muscles intact, no nystagmus, facial sensation and strength symmetric, uvula midline, shoulder shrug symmetric, tongue midline. MOTOR: normal bulk and tone, full strength in  the BUE, BLE SENSORY: DECR PP IN FEET IN GRADIENT UP TO KNEES. ABSENT VIB IN RIGHT TOES. 2 SEC VIB IN LEFT TOES.  COORDINATION: finger-nose-finger, fine finger movements normal REFLEXES: BUE 2, RIGHT KNEE 2, LEFT KNEE 1, ANKLES 1. GAIT/STATION: narrow based gait; able to walk on toes, heels and tandem; romberg is negative   DIAGNOSTIC DATA (LABS, IMAGING, TESTING) - I reviewed patient records, labs, notes, testing and imaging myself where available.  Lab Results  Component Value Date   WBC 7.0 09/17/2010   HGB 10.9* 10/04/2012   HCT 32.0*  10/04/2012   MCV 92.9 09/17/2010   PLT 122* 09/17/2010      Component Value Date/Time   NA 142 10/04/2012 0516   K 4.8 10/04/2012 0516   CL 105 10/04/2012 0516   CO2 23 09/17/2010 1710   GLUCOSE 95 10/04/2012 0516   BUN 28* 10/04/2012 0516   CREATININE 1.10 10/04/2012 0516   CALCIUM 8.8 09/17/2010 1710   AST 17 05/01/2009 0851   GFRNONAA >60 09/17/2010 1710   GFRAA  Value: >60        The eGFR has been calculated using the MDRD equation. This calculation has not been validated in all clinical situations. eGFR's persistently <60 mL/min signify possible Chronic Kidney Disease. 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  10/28/10 MRI lumbar spine - mild to moderate spinal stenosis at L1-2, moderate to marked left-sided and moderate right-sided spinal stenosis at  L2-3, moderate to marked spinal stenosis and bilateral lateral recess stenosis at L3-4, mild spinal stenosis greater on the right and mild left foraminal narrowing at L4-5, broad-based disc osteophyte ridge and left mild indentation of the thecal sac at L5-S1 with mild extension to the left but no compression of the L5 nerve root.   ASSESSMENT AND PLAN  79 y.o. year old male  has a past medical history of Neuropathy and Hyperlipidemia. here with previous history of neck pain, lumbar spinal stenosis, subjective memory loss, history of prostate cancer.  He has vitamin B12 deficiency (180) recently diagnosed and now on treatment. He also has history of lumbar spinal stenosis. I suspect his recent lower extremity pain and spasm related to combination of these. I offered MRI lumbar spine testing but patient declined. If symptoms return or worsen I would order this test.  PLAN: 1. Continue diclofenac 2. Continue B12 replacement 3. If symptoms worsen I will check MRI lumbar spine and order physical therapy   Suanne Marker, MD 10/22/2012, 4:22 PM Certified in Neurology, Neurophysiology and Neuroimaging  [facility] [address] [place], [place] [address] [phone]

## 2012-10-22 NOTE — Patient Instructions (Signed)
Continue B12 replacement.  If leg symptoms worsen, then check MRI lumbar spine.

## 2012-10-24 DIAGNOSIS — D510 Vitamin B12 deficiency anemia due to intrinsic factor deficiency: Secondary | ICD-10-CM | POA: Diagnosis not present

## 2012-10-31 DIAGNOSIS — D510 Vitamin B12 deficiency anemia due to intrinsic factor deficiency: Secondary | ICD-10-CM | POA: Diagnosis not present

## 2012-11-07 DIAGNOSIS — D510 Vitamin B12 deficiency anemia due to intrinsic factor deficiency: Secondary | ICD-10-CM | POA: Diagnosis not present

## 2012-11-14 DIAGNOSIS — D510 Vitamin B12 deficiency anemia due to intrinsic factor deficiency: Secondary | ICD-10-CM | POA: Diagnosis not present

## 2012-11-15 DIAGNOSIS — R195 Other fecal abnormalities: Secondary | ICD-10-CM | POA: Diagnosis not present

## 2012-11-15 DIAGNOSIS — R197 Diarrhea, unspecified: Secondary | ICD-10-CM | POA: Diagnosis not present

## 2012-11-16 ENCOUNTER — Other Ambulatory Visit: Payer: Self-pay | Admitting: Gastroenterology

## 2012-11-26 DIAGNOSIS — Z87891 Personal history of nicotine dependence: Secondary | ICD-10-CM | POA: Diagnosis not present

## 2012-11-26 DIAGNOSIS — Z8719 Personal history of other diseases of the digestive system: Secondary | ICD-10-CM | POA: Diagnosis not present

## 2012-11-26 DIAGNOSIS — H2510 Age-related nuclear cataract, unspecified eye: Secondary | ICD-10-CM | POA: Diagnosis not present

## 2012-11-26 DIAGNOSIS — K921 Melena: Secondary | ICD-10-CM | POA: Diagnosis not present

## 2012-11-26 DIAGNOSIS — Z86010 Personal history of colonic polyps: Secondary | ICD-10-CM | POA: Diagnosis not present

## 2012-11-26 DIAGNOSIS — Z8546 Personal history of malignant neoplasm of prostate: Secondary | ICD-10-CM | POA: Diagnosis not present

## 2012-12-04 DIAGNOSIS — H2589 Other age-related cataract: Secondary | ICD-10-CM | POA: Diagnosis not present

## 2012-12-04 DIAGNOSIS — Z8546 Personal history of malignant neoplasm of prostate: Secondary | ICD-10-CM | POA: Diagnosis not present

## 2012-12-05 DIAGNOSIS — Z48810 Encounter for surgical aftercare following surgery on the sense organs: Secondary | ICD-10-CM | POA: Diagnosis not present

## 2012-12-05 DIAGNOSIS — Z961 Presence of intraocular lens: Secondary | ICD-10-CM | POA: Diagnosis not present

## 2012-12-05 DIAGNOSIS — Z9849 Cataract extraction status, unspecified eye: Secondary | ICD-10-CM | POA: Diagnosis not present

## 2012-12-06 ENCOUNTER — Encounter (HOSPITAL_COMMUNITY): Payer: Self-pay | Admitting: *Deleted

## 2012-12-06 DIAGNOSIS — Z87898 Personal history of other specified conditions: Secondary | ICD-10-CM

## 2012-12-06 DIAGNOSIS — G629 Polyneuropathy, unspecified: Secondary | ICD-10-CM

## 2012-12-06 DIAGNOSIS — C801 Malignant (primary) neoplasm, unspecified: Secondary | ICD-10-CM

## 2012-12-06 HISTORY — PX: OTHER SURGICAL HISTORY: SHX169

## 2012-12-06 HISTORY — DX: Personal history of other specified conditions: Z87.898

## 2012-12-06 HISTORY — PX: PROSTATE SURGERY: SHX751

## 2012-12-06 HISTORY — DX: Malignant (primary) neoplasm, unspecified: C80.1

## 2012-12-06 HISTORY — DX: Polyneuropathy, unspecified: G62.9

## 2012-12-07 ENCOUNTER — Encounter (HOSPITAL_COMMUNITY): Payer: Self-pay | Admitting: Pharmacy Technician

## 2012-12-17 DIAGNOSIS — D510 Vitamin B12 deficiency anemia due to intrinsic factor deficiency: Secondary | ICD-10-CM | POA: Diagnosis not present

## 2012-12-20 DIAGNOSIS — Z8546 Personal history of malignant neoplasm of prostate: Secondary | ICD-10-CM | POA: Diagnosis not present

## 2012-12-20 DIAGNOSIS — H2589 Other age-related cataract: Secondary | ICD-10-CM | POA: Diagnosis not present

## 2012-12-25 ENCOUNTER — Encounter (HOSPITAL_COMMUNITY): Payer: Self-pay | Admitting: Anesthesiology

## 2012-12-25 ENCOUNTER — Ambulatory Visit (HOSPITAL_COMMUNITY): Payer: Medicare Other | Admitting: Anesthesiology

## 2012-12-25 ENCOUNTER — Encounter (HOSPITAL_COMMUNITY): Payer: Self-pay | Admitting: *Deleted

## 2012-12-25 ENCOUNTER — Encounter (HOSPITAL_COMMUNITY)
Admission: RE | Disposition: A | Discharge status: Home / Self Care | Payer: Self-pay | Source: Ambulatory Visit | Attending: Gastroenterology

## 2012-12-25 ENCOUNTER — Ambulatory Visit (HOSPITAL_COMMUNITY)
Admission: RE | Admit: 2012-12-25 | Discharge: 2012-12-25 | Disposition: A | Discharge status: Home / Self Care | Payer: Medicare Other | Source: Ambulatory Visit | Attending: Gastroenterology | Admitting: Gastroenterology

## 2012-12-25 DIAGNOSIS — I2510 Atherosclerotic heart disease of native coronary artery without angina pectoris: Secondary | ICD-10-CM | POA: Insufficient documentation

## 2012-12-25 DIAGNOSIS — Z860100 Personal history of colon polyps, unspecified: Secondary | ICD-10-CM | POA: Insufficient documentation

## 2012-12-25 DIAGNOSIS — R197 Diarrhea, unspecified: Secondary | ICD-10-CM | POA: Insufficient documentation

## 2012-12-25 DIAGNOSIS — E7800 Pure hypercholesterolemia, unspecified: Secondary | ICD-10-CM | POA: Insufficient documentation

## 2012-12-25 DIAGNOSIS — Z8546 Personal history of malignant neoplasm of prostate: Secondary | ICD-10-CM | POA: Diagnosis not present

## 2012-12-25 DIAGNOSIS — Z79899 Other long term (current) drug therapy: Secondary | ICD-10-CM | POA: Diagnosis not present

## 2012-12-25 DIAGNOSIS — R195 Other fecal abnormalities: Secondary | ICD-10-CM | POA: Diagnosis not present

## 2012-12-25 DIAGNOSIS — D126 Benign neoplasm of colon, unspecified: Secondary | ICD-10-CM | POA: Diagnosis not present

## 2012-12-25 DIAGNOSIS — K219 Gastro-esophageal reflux disease without esophagitis: Secondary | ICD-10-CM | POA: Diagnosis not present

## 2012-12-25 DIAGNOSIS — Z7982 Long term (current) use of aspirin: Secondary | ICD-10-CM | POA: Insufficient documentation

## 2012-12-25 HISTORY — DX: Gastro-esophageal reflux disease without esophagitis: K21.9

## 2012-12-25 HISTORY — DX: Personal history of other specified conditions: Z87.898

## 2012-12-25 HISTORY — DX: Anemia, unspecified: D64.9

## 2012-12-25 HISTORY — DX: Unspecified osteoarthritis, unspecified site: M19.90

## 2012-12-25 HISTORY — DX: Major depressive disorder, single episode, unspecified: F32.9

## 2012-12-25 HISTORY — DX: Malignant (primary) neoplasm, unspecified: C80.1

## 2012-12-25 HISTORY — DX: Personal history of other diseases of the digestive system: Z87.19

## 2012-12-25 HISTORY — DX: Depression, unspecified: F32.A

## 2012-12-25 HISTORY — PX: ESOPHAGOGASTRODUODENOSCOPY (EGD) WITH PROPOFOL: SHX5813

## 2012-12-25 HISTORY — PX: COLONOSCOPY WITH PROPOFOL: SHX5780

## 2012-12-25 SURGERY — ESOPHAGOGASTRODUODENOSCOPY (EGD) WITH PROPOFOL
Anesthesia: Monitor Anesthesia Care

## 2012-12-25 MED ORDER — MIDAZOLAM HCL 5 MG/5ML IJ SOLN
INTRAMUSCULAR | Status: DC | PRN
  Administered 2012-12-25: 2 mg via INTRAVENOUS

## 2012-12-25 MED ORDER — LACTATED RINGERS IV SOLN
INTRAVENOUS | Status: DC
  Administered 2012-12-25: 1000 mL via INTRAVENOUS

## 2012-12-25 MED ORDER — PROPOFOL INFUSION 10 MG/ML OPTIME
INTRAVENOUS | Status: DC | PRN
  Administered 2012-12-25: 70 ug/kg/min via INTRAVENOUS

## 2012-12-25 MED ORDER — BUTAMBEN-TETRACAINE-BENZOCAINE 2-2-14 % EX AERO
INHALATION_SPRAY | CUTANEOUS | Status: DC | PRN
  Administered 2012-12-25: 1 via TOPICAL

## 2012-12-25 MED ORDER — SODIUM CHLORIDE 0.9 % IV SOLN
INTRAVENOUS | Status: DC
Start: 2012-12-25 — End: 2012-12-25

## 2012-12-25 SURGICAL SUPPLY — 24 items

## 2012-12-25 NOTE — Anesthesia Postprocedure Evaluation (Signed)
Anesthesia Post Note  Patient: Ryan Humphrey  Procedure(s) Performed: Procedure(s) (LRB): ESOPHAGOGASTRODUODENOSCOPY (EGD) WITH PROPOFOL (N/A) COLONOSCOPY WITH PROPOFOL (N/A)  Anesthesia type: MAC  Patient location: PACU  Post pain: Pain level controlled  Post assessment: Post-op Vital signs reviewed  Last Vitals:  Filed Vitals:   12/25/12 1500  BP: 160/79  Temp:   Resp: 18    Post vital signs: Reviewed  Level of consciousness: sedated  Complications: No apparent anesthesia complications

## 2012-12-25 NOTE — Op Note (Signed)
Problem: Hemoccult-positive stool and chronic diarrhea  Endoscopist: Danise Edge  Premedication: Propofol administered by anesthesia  Procedure: Diagnostic esophagogastroduodenoscopy The patient was placed in the left lateral decubitus position. The Pentax gastroscope was passed through the posterior hypopharynx into the proximal esophagus without difficulty. The hypopharynx, larynx, cords appeared normal.  Esophagoscopy: The proximal, mid, and lower segments of the esophageal mucosa appear normal. The squamocolumnar junction is regular and noted at 40 cm from the incisor teeth.  Gastroscopy: Retroflex view of the gastric cardia and fundus was normal. The gastric body, antrum, and pylorus appeared normal.  Duodenoscopy: The duodenal bulb, second portion of duodenum, and third portion of duodenum appeared normal.  Assessment: Normal esophagogastroduodenoscopy.  Procedure: Diagnostic colonoscopy with random colon biopsies to rule out microscopic colitis. Anal inspection and digital rectal exam were normal. The Pentax pediatric colonoscope was introduced into the rectum and advanced to the cecum. A normal-appearing ileocecal valve and appendiceal orifice were identified. Colonic preparation for the exam today was fair at best.  Rectum. Normal. Retroflex view of the distal rectum normal.  Sigmoid colon and descending colon. Normal.  Splenic flexure. Normal.  Transverse colon. Normal.  Hepatic flexure. Normal.  Ascending colon. Normal.  Cecum and ileocecal valve. Normal.  Biopsies: Random biopsies were performed from the right colon and left colon to rule out microscopic colitis.  Assessment: Normal proctocolonoscopy to the cecum. Colonic preparation for the exam today was fair at best. Random colon biopsies to rule out microscopic colitis pending.

## 2012-12-25 NOTE — Transfer of Care (Signed)
Immediate Anesthesia Transfer of Care Note  Patient: Ryan Humphrey  Procedure(s) Performed: Procedure(s): ESOPHAGOGASTRODUODENOSCOPY (EGD) WITH PROPOFOL (N/A) COLONOSCOPY WITH PROPOFOL (N/A)  Patient Location: PACU  Anesthesia Type:MAC  Level of Consciousness: sedated  Airway & Oxygen Therapy: Patient Spontanous Breathing and Patient connected to nasal cannula oxygen  Post-op Assessment: Report given to PACU RN and Post -op Vital signs reviewed and stable  Post vital signs: Reviewed and stable  Complications: No apparent anesthesia complications

## 2012-12-25 NOTE — H&P (Signed)
  Problem: Hemoccult-positive stool (on aspirin plus diclofenac) and chronic diarrhea.  History: The patient is an 79 year old male born 15-Mar-1929. He underwent a normal surveillance colonoscopy in 2008. Colon polyps were removed colonoscopically in 2004.  The patient is experiencing intermittent, nonbloody diarrhea. He submitted stool for Hemoccult testing and his stool was heme positive. He chronically takes aspirin plus diclofenac.  The patient is scheduled to undergo a diagnostic esophagogastroduodenoscopy and colonoscopy with random colon biopsies to rule out microscopic colitis and gastrointestinal bleeding.  Chronic medications: Calcium. Vitamin D. Omeprazole. Aspirin. Niaspan. Gabapentin. Simvastatin. Diclofenac. Alendronate. Diltiazem cream. Bentyl. Sertraline. Lomotil.  Past medical history: Prostate cancer surgery in 1998. Prostate cancer radiation in 2000. Restless leg syndrome. Hypercholesterolemia. Peripheral neuropathy. Coronary artery disease. Osteopenia. Vertigo. Cholecystectomy. Radical prostatectomy. Carpal tunnel release surgery. Tonsillectomy. Appendectomy.  Medication allergies: Penicillin  Habits: The patient quit smoking cigarettes in 1970. He consumes alcohol in moderation.  Exam: The patient is alert and lying comfortably on the endoscopy stretcher. Cardiac exam reveals a regular rhythm. Lungs are clear to auscultation. Abdomen is soft and nontender to palpation.  Plan: Proceed with diagnostic esophagogastroduodenoscopy and colonoscopy with random colon biopsies to evaluate Hemoccult-positive stool and chronic diarrhea.

## 2012-12-25 NOTE — Anesthesia Preprocedure Evaluation (Addendum)
Anesthesia Evaluation  Patient identified by MRN, date of birth, ID band Patient awake    Reviewed: Allergy & Precautions, H&P , NPO status , Patient's Chart, lab work & pertinent test results  Airway Mallampati: II TM Distance: >3 FB Neck ROM: Full    Dental  (+) Teeth Intact and Dental Advisory Given   Pulmonary neg pulmonary ROS, former smoker,  breath sounds clear to auscultation  Pulmonary exam normal       Cardiovascular + CAD Rhythm:Regular Rate:Normal     Neuro/Psych  Neuromuscular disease negative psych ROS   GI/Hepatic Neg liver ROS, hiatal hernia, GERD-  Medicated,  Endo/Other  negative endocrine ROS  Renal/GU negative Renal ROS  negative genitourinary   Musculoskeletal negative musculoskeletal ROS (+)   Abdominal   Peds negative pediatric ROS (+)  Hematology negative hematology ROS (+)   Anesthesia Other Findings   Reproductive/Obstetrics                          Anesthesia Physical Anesthesia Plan  ASA: II  Anesthesia Plan: MAC   Post-op Pain Management:    Induction: Intravenous  Airway Management Planned: Nasal Cannula  Additional Equipment:   Intra-op Plan:   Post-operative Plan:   Informed Consent: I have reviewed the patients History and Physical, chart, labs and discussed the procedure including the risks, benefits and alternatives for the proposed anesthesia with the patient or authorized representative who has indicated his/her understanding and acceptance.   Dental advisory given  Plan Discussed with: CRNA  Anesthesia Plan Comments:         Anesthesia Quick Evaluation

## 2012-12-26 ENCOUNTER — Encounter (HOSPITAL_COMMUNITY): Payer: Self-pay | Admitting: Gastroenterology

## 2012-12-31 DIAGNOSIS — R972 Elevated prostate specific antigen [PSA]: Secondary | ICD-10-CM | POA: Diagnosis not present

## 2013-01-09 DIAGNOSIS — Z79899 Other long term (current) drug therapy: Secondary | ICD-10-CM | POA: Diagnosis not present

## 2013-01-09 DIAGNOSIS — F39 Unspecified mood [affective] disorder: Secondary | ICD-10-CM | POA: Diagnosis not present

## 2013-01-09 DIAGNOSIS — C61 Malignant neoplasm of prostate: Secondary | ICD-10-CM | POA: Diagnosis not present

## 2013-01-09 DIAGNOSIS — I2510 Atherosclerotic heart disease of native coronary artery without angina pectoris: Secondary | ICD-10-CM | POA: Diagnosis not present

## 2013-01-09 DIAGNOSIS — G609 Hereditary and idiopathic neuropathy, unspecified: Secondary | ICD-10-CM | POA: Diagnosis not present

## 2013-01-09 DIAGNOSIS — Z0000 Encounter for general adult medical examination without abnormal findings: Secondary | ICD-10-CM | POA: Diagnosis not present

## 2013-01-18 DIAGNOSIS — D510 Vitamin B12 deficiency anemia due to intrinsic factor deficiency: Secondary | ICD-10-CM | POA: Diagnosis not present

## 2013-02-05 DIAGNOSIS — C4441 Basal cell carcinoma of skin of scalp and neck: Secondary | ICD-10-CM | POA: Diagnosis not present

## 2013-02-05 DIAGNOSIS — D485 Neoplasm of uncertain behavior of skin: Secondary | ICD-10-CM | POA: Diagnosis not present

## 2013-02-05 DIAGNOSIS — L821 Other seborrheic keratosis: Secondary | ICD-10-CM | POA: Diagnosis not present

## 2013-02-05 DIAGNOSIS — L723 Sebaceous cyst: Secondary | ICD-10-CM | POA: Diagnosis not present

## 2013-02-05 DIAGNOSIS — D234 Other benign neoplasm of skin of scalp and neck: Secondary | ICD-10-CM | POA: Diagnosis not present

## 2013-02-05 DIAGNOSIS — D239 Other benign neoplasm of skin, unspecified: Secondary | ICD-10-CM | POA: Diagnosis not present

## 2013-02-19 DIAGNOSIS — D510 Vitamin B12 deficiency anemia due to intrinsic factor deficiency: Secondary | ICD-10-CM | POA: Diagnosis not present

## 2013-02-21 ENCOUNTER — Other Ambulatory Visit: Payer: Self-pay | Admitting: Dermatology

## 2013-02-21 DIAGNOSIS — D485 Neoplasm of uncertain behavior of skin: Secondary | ICD-10-CM | POA: Diagnosis not present

## 2013-03-13 ENCOUNTER — Other Ambulatory Visit: Payer: Self-pay

## 2013-03-13 MED ORDER — DICLOFENAC SODIUM 75 MG PO TBEC: 75.0000 mg | DELAYED_RELEASE_TABLET | Freq: Two times a day (BID) | ORAL | Status: DC

## 2013-03-22 DIAGNOSIS — D510 Vitamin B12 deficiency anemia due to intrinsic factor deficiency: Secondary | ICD-10-CM | POA: Diagnosis not present

## 2013-04-06 DIAGNOSIS — Z23 Encounter for immunization: Secondary | ICD-10-CM | POA: Diagnosis not present

## 2013-04-23 DIAGNOSIS — D510 Vitamin B12 deficiency anemia due to intrinsic factor deficiency: Secondary | ICD-10-CM | POA: Diagnosis not present

## 2013-05-06 DIAGNOSIS — C61 Malignant neoplasm of prostate: Secondary | ICD-10-CM | POA: Diagnosis not present

## 2013-05-06 DIAGNOSIS — N320 Bladder-neck obstruction: Secondary | ICD-10-CM | POA: Diagnosis not present

## 2013-05-06 DIAGNOSIS — N393 Stress incontinence (female) (male): Secondary | ICD-10-CM | POA: Diagnosis not present

## 2013-05-06 DIAGNOSIS — Z961 Presence of intraocular lens: Secondary | ICD-10-CM | POA: Diagnosis not present

## 2013-05-06 DIAGNOSIS — Z87891 Personal history of nicotine dependence: Secondary | ICD-10-CM | POA: Diagnosis not present

## 2013-05-06 DIAGNOSIS — L719 Rosacea, unspecified: Secondary | ICD-10-CM | POA: Diagnosis not present

## 2013-05-24 DIAGNOSIS — D510 Vitamin B12 deficiency anemia due to intrinsic factor deficiency: Secondary | ICD-10-CM | POA: Diagnosis not present

## 2013-06-25 DIAGNOSIS — D510 Vitamin B12 deficiency anemia due to intrinsic factor deficiency: Secondary | ICD-10-CM | POA: Diagnosis not present

## 2013-07-29 DIAGNOSIS — D510 Vitamin B12 deficiency anemia due to intrinsic factor deficiency: Secondary | ICD-10-CM | POA: Diagnosis not present

## 2013-08-27 DIAGNOSIS — D510 Vitamin B12 deficiency anemia due to intrinsic factor deficiency: Secondary | ICD-10-CM | POA: Diagnosis not present

## 2013-09-02 DIAGNOSIS — R197 Diarrhea, unspecified: Secondary | ICD-10-CM | POA: Diagnosis not present

## 2013-09-04 DIAGNOSIS — R197 Diarrhea, unspecified: Secondary | ICD-10-CM | POA: Diagnosis not present

## 2013-09-30 DIAGNOSIS — D510 Vitamin B12 deficiency anemia due to intrinsic factor deficiency: Secondary | ICD-10-CM | POA: Diagnosis not present

## 2013-10-11 DIAGNOSIS — M25549 Pain in joints of unspecified hand: Secondary | ICD-10-CM | POA: Diagnosis not present

## 2013-10-11 DIAGNOSIS — M25569 Pain in unspecified knee: Secondary | ICD-10-CM | POA: Diagnosis not present

## 2013-10-22 ENCOUNTER — Encounter: Payer: Self-pay | Admitting: Diagnostic Neuroimaging

## 2013-10-22 ENCOUNTER — Encounter (INDEPENDENT_AMBULATORY_CARE_PROVIDER_SITE_OTHER): Payer: Self-pay

## 2013-10-22 ENCOUNTER — Ambulatory Visit (INDEPENDENT_AMBULATORY_CARE_PROVIDER_SITE_OTHER): Payer: Medicare Other | Admitting: Diagnostic Neuroimaging

## 2013-10-22 VITALS — BP 131/63 | HR 90 | Ht 71.0 in | Wt 177.5 lb

## 2013-10-22 DIAGNOSIS — G589 Mononeuropathy, unspecified: Secondary | ICD-10-CM

## 2013-10-22 DIAGNOSIS — G629 Polyneuropathy, unspecified: Secondary | ICD-10-CM

## 2013-10-22 DIAGNOSIS — E538 Deficiency of other specified B group vitamins: Secondary | ICD-10-CM | POA: Diagnosis not present

## 2013-10-22 DIAGNOSIS — M48061 Spinal stenosis, lumbar region without neurogenic claudication: Secondary | ICD-10-CM

## 2013-10-22 DIAGNOSIS — M79609 Pain in unspecified limb: Secondary | ICD-10-CM | POA: Diagnosis not present

## 2013-10-22 DIAGNOSIS — M79605 Pain in left leg: Principal | ICD-10-CM

## 2013-10-22 DIAGNOSIS — M79604 Pain in right leg: Secondary | ICD-10-CM

## 2013-10-22 NOTE — Progress Notes (Signed)
[facility]  PATIENT: Ryan Humphrey DOB: 1928-08-11  REFERRING CLINICIAN:  HISTORY FROM: patient REASON FOR VISIT: follow up   HISTORICAL  CHIEF COMPLAINT:  Chief Complaint  Patient presents with  . Follow-up    bilateral leg pain    HISTORY OF PRESENT ILLNESS:   UPDATE 10/22/13: Since last visit, pain is stable. Still with cramps in legs. Still with burning in feet. On gabapentin 368m TID. Not interested in MRI studies, additional testing, or additional pain meds.   UPDATE 10/22/12: Since last visit patient is here for followup of after going to the emergency room with painful spasms and numbness in lower extremities. This happened one night at 1:00 in the morning while he was laying in bed. Patient stood up and walked around and symptoms slightly relief. When he laid back down symptoms returned.this happened [facility] 10[facility]04/12, [facility]. Symptoms improved with valium. Some residual lower extremity numbness.  PRIOR HPI (12/23/11, Dr. LErling Cruz: 80year [place] right[place] of nonradiating neck pain, mostly on the right side of his neck but also on the left side of his neck, not associated with numbness in his hands. He has felt a" grating feeling" around his head and neck and skull with excruciating pain at times going towards his ears. Aleve and acetaminophen were without benefit. Injections by Dr. WMina Marblewere of no benefit. [facility]. He has responded well to diclofenac 75 mg twice per day but because of potential long-term side effects I cut him to 50 mg twice per day. He developed a lumbar spine pain radiating into his left leg and underwent physical therapy for one month. This was excruciating pain  that goes down the back of  the leg to the foot. He has numbness of the toes of both feet. He has rare bladder  incontinence and has had surgery for prostate cancer and external radiation and hormone therapy for 4 years.He has had diarrhea with occasional bowel incontinence.  His diclofenac was increased to 50 mg t.i.d. with improvement in back pain He  has a history of lightheaded sensation and movement. He developes nausea and vomiting for one and a  half hours.He does not have  tinnitus or loss of hearing. He  underwent a cardiac catheterization  October 2011 and is  followed by Dr. RHarrington Challenger cardiologist. He had vertigo, but none since last year. He has tinnitus and continued symptoms of irritable bowel syndrome. He has difficulty getting his toes warm during the wintertime. He does much better "warm weather" .He has cramps in his feet at night. He is using a new step machine 5 days per week for 30 minutes each session to exercise.    REVIEW OF SYSTEMS: Full 14 system review of systems performed and notable only for memory loss numbness ringing in ears muscle cramps diarrhea walking diff.   ALLERGIES: Allergies  Allergen Reactions  . Penicillins Other (See Comments)    "rash"    HOME MEDICATIONS: Outpatient Prescriptions Prior to Visit  Medication Sig Dispense Refill  . alendronate (FOSAMAX) 70 MG tablet Take 70 mg by mouth every Saturday. Take with a full glass of water on an empty stomach.      .Marland Kitchenaspirin 325 MG tablet Take 325 mg by mouth daily.      . Calcium Carbonate-Vitamin D (CALTRATE 600+D PO) Take 1 tablet by  mouth 2 (two) times daily.      . Cholecalciferol (VITAMIN D) 2000 UNITS tablet Take 2,000 Units by mouth daily.      . diphenoxylate-atropine (LOMOTIL) 2.5-0.025 MG per tablet Take 1 tablet by mouth 4 (four) times daily as needed for diarrhea or loose stools.      . gabapentin (NEURONTIN) 300 MG capsule Take 300 mg by mouth 3 (three) times daily.       . simvastatin (ZOCOR) 20 MG tablet Take 20 mg by mouth every evening.      . vitamin C (ASCORBIC ACID) 500 MG tablet Take 500 mg by mouth  2 (two) times daily.      . diclofenac (VOLTAREN) 75 MG EC tablet Take 1 tablet (75 mg total) by mouth 2 (two) times daily.  180 tablet  3  . niacin (NIASPAN) 1000 MG CR tablet Take 1,000 mg by mouth at bedtime.      Marland Kitchen omeprazole (PRILOSEC) 20 MG capsule Take 20 mg by mouth daily.      Vladimir Faster Glycol-Propyl Glycol (SYSTANE PRESERVATIVE FREE OP) Apply 1 drop to eye daily as needed (for dry eyes). For dry & granulated eyes.       No facility-administered medications prior to visit.    PAST MEDICAL HISTORY: Past Medical History  Diagnosis Date  . Neuropathy   . Hyperlipidemia   . Arthritis     arthritis,osteopenia,"spinal stenosis"  . GERD (gastroesophageal reflux disease)   . H/O hiatal hernia   . H/O vertigo 12-06-12    none recent  . Anemia   . Depression   . Cancer 12-06-12    Prostate cancer'98  . Neuromuscular disorder 12-06-12    peripheral neuropathy    PAST SURGICAL HISTORY: Past Surgical History  Procedure Laterality Date  . Appendectomy    . Cholecystectomy    . Carpal tunnel release      both hands  . Prostate surgery  12-06-12  . Cataract surgery Left 12-06-12    recent surgery  . Esophagogastroduodenoscopy (egd) with propofol N/A 12/25/2012    Procedure: ESOPHAGOGASTRODUODENOSCOPY (EGD) WITH PROPOFOL;  Surgeon: Garlan Fair, MD;  Location: [facility];  Service: Endoscopy;  Laterality: N/A;  . Colonoscopy with propofol N/A 12/25/2012    Procedure: COLONOSCOPY WITH PROPOFOL;  Surgeon: Garlan Fair, MD;  Location: [facility];  Service: Endoscopy;  Laterality: N/A;    FAMILY HISTORY: Family History  Problem Relation Age of Onset  . Memory loss Mother     SOCIAL HISTORY:  History   Social History  . Marital Status: Widowed    Spouse Name: N/A    Number of Children: 2  . Years of Education: College   Occupational History  . Retired    Social History Main Topics  . Smoking status: Former Smoker -- 0.50 packs/day for 30 years    Quit date:  06/14/1963  . Smokeless tobacco: Never Used  . Alcohol Use: Yes     Comment: 1-2 drinks nightly  . Drug Use: No  . Sexual Activity: Not Currently   Other Topics Concern  . Not on file   Social History Narrative   Pt lives at home alone. Plays the piano.   Caffeine Use: Rarely     PHYSICAL EXAM  Filed Vitals:   10/22/13 1418  BP: 131/63  Pulse: 90  Height: '5\' 11"'  (1.803 m)  Weight: 177 lb 8 oz (80.513 kg)   Body mass index is 24.77 kg/(m^2).  GENERAL EXAM: Patient  is in no distress  CARDIOVASCULAR: Regular rate and rhythm, no murmurs, no carotid bruits  NEUROLOGIC: MENTAL STATUS: awake, alert, language fluent, comprehension intact, naming intact CRANIAL NERVE: pupils equal and reactive to light, visual fields full to confrontation, extraocular muscles intact, no nystagmus, facial sensation and strength symmetric, uvula midline, shoulder shrug symmetric, tongue midline. MOTOR: normal bulk and tone, full strength in the BUE, BLE SENSORY: DECR PP IN FEET IN GRADIENT UP TO KNEES. ABSENT VIB IN RIGHT TOES. 2 SEC VIB IN LEFT TOES.  COORDINATION: finger-nose-finger, fine finger movements normal REFLEXES: BUE 2, RIGHT KNEE 2, LEFT KNEE 1, ANKLES 1. GAIT/STATION: narrow based gait; LIMPING ON RIGHT LEG. SLIGHTLY UNSTEADY. ROMBERG POSITIVE.    DIAGNOSTIC DATA (LABS, IMAGING, TESTING) - I reviewed patient records, labs, notes, testing and imaging myself where available.  Lab Results  Component Value Date   WBC 7.0 09/17/2010   HGB 10.9* 10/04/2012   HCT 32.0* 10/04/2012   MCV 92.9 09/17/2010   PLT 122* 09/17/2010      Component Value Date/Time   NA 142 10/04/2012 0516   K 4.8 10/04/2012 0516   CL 105 10/04/2012 0516   CO2 23 09/17/2010 1710   GLUCOSE 95 10/04/2012 0516   BUN 28* 10/04/2012 0516   CREATININE 1.10 10/04/2012 0516   CALCIUM 8.8 09/17/2010 1710   AST 17 05/01/2009 0851   GFRNONAA >60 09/17/2010 1710   GFRAA  Value: >60        The eGFR has been calculated using the MDRD  equation. This calculation has not been validated in all clinical situations. eGFR's persistently <60 mL/min signify possible Chronic Kidney Disease. 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  10/28/10 MRI lumbar spine - mild to moderate spinal stenosis at L1-2, moderate to marked left-sided and moderate right-sided spinal stenosis at L2-3, moderate to marked spinal stenosis and bilateral lateral recess stenosis at L3-4, mild spinal stenosis greater on the right and mild left foraminal narrowing at L4-5, broad-based disc osteophyte ridge and left mild indentation of the thecal sac at L5-S1 with mild extension to the left but no compression of the L5 nerve root.   ASSESSMENT AND PLAN  80 y.o. year old male  has a past medical history of Neuropathy; Hyperlipidemia; Arthritis; GERD (gastroesophageal reflux disease); H/O hiatal hernia; H/O vertigo (12-06-12); Anemia; Depression; Cancer (12-06-12); and Neuromuscular disorder (12-06-12). here with previous history of neck pain, lumbar spinal stenosis, subjective memory loss, history of prostate cancer.  He has vitamin B12 deficiency (previously 180) now on treatment. He also has history of lumbar spinal stenosis. I suspect his lower extremity pain and spasm related to combination of these. I offered MRI lumbar spine testing but patient declined, as he is  not interested in surgery or interventional pain mgmt techniques at this time.   PLAN: 1. Continue current medications  2. Consider pain mgmt clinic if symptoms worsen.  Return if symptoms worsen or fail to improve, for return to PCP.     Penni Bombard, MD 3/40/3709, 6:43 PM Certified in Neurology, Neurophysiology and Neuroimaging  [facility] [address], [place], [place] [address] [phone]

## 2013-10-31 DIAGNOSIS — D510 Vitamin B12 deficiency anemia due to intrinsic factor deficiency: Secondary | ICD-10-CM | POA: Diagnosis not present

## 2013-11-11 DIAGNOSIS — C61 Malignant neoplasm of prostate: Secondary | ICD-10-CM | POA: Diagnosis not present

## 2013-11-11 DIAGNOSIS — N320 Bladder-neck obstruction: Secondary | ICD-10-CM | POA: Diagnosis not present

## 2013-12-02 DIAGNOSIS — D510 Vitamin B12 deficiency anemia due to intrinsic factor deficiency: Secondary | ICD-10-CM | POA: Diagnosis not present

## 2013-12-27 DIAGNOSIS — M19049 Primary osteoarthritis, unspecified hand: Secondary | ICD-10-CM | POA: Diagnosis not present

## 2014-01-01 DIAGNOSIS — D510 Vitamin B12 deficiency anemia due to intrinsic factor deficiency: Secondary | ICD-10-CM | POA: Diagnosis not present

## 2014-01-03 DIAGNOSIS — Z961 Presence of intraocular lens: Secondary | ICD-10-CM | POA: Diagnosis not present

## 2014-01-14 DIAGNOSIS — C61 Malignant neoplasm of prostate: Secondary | ICD-10-CM | POA: Diagnosis not present

## 2014-01-14 DIAGNOSIS — Z1331 Encounter for screening for depression: Secondary | ICD-10-CM | POA: Diagnosis not present

## 2014-01-14 DIAGNOSIS — K219 Gastro-esophageal reflux disease without esophagitis: Secondary | ICD-10-CM | POA: Diagnosis not present

## 2014-01-14 DIAGNOSIS — F39 Unspecified mood [affective] disorder: Secondary | ICD-10-CM | POA: Diagnosis not present

## 2014-01-14 DIAGNOSIS — Z79899 Other long term (current) drug therapy: Secondary | ICD-10-CM | POA: Diagnosis not present

## 2014-01-14 DIAGNOSIS — Z0000 Encounter for general adult medical examination without abnormal findings: Secondary | ICD-10-CM | POA: Diagnosis not present

## 2014-01-14 DIAGNOSIS — I2510 Atherosclerotic heart disease of native coronary artery without angina pectoris: Secondary | ICD-10-CM | POA: Diagnosis not present

## 2014-01-14 DIAGNOSIS — G609 Hereditary and idiopathic neuropathy, unspecified: Secondary | ICD-10-CM | POA: Diagnosis not present

## 2014-01-14 DIAGNOSIS — Z23 Encounter for immunization: Secondary | ICD-10-CM | POA: Diagnosis not present

## 2014-01-27 DIAGNOSIS — M1710 Unilateral primary osteoarthritis, unspecified knee: Secondary | ICD-10-CM | POA: Diagnosis not present

## 2014-02-04 DIAGNOSIS — D1801 Hemangioma of skin and subcutaneous tissue: Secondary | ICD-10-CM | POA: Diagnosis not present

## 2014-02-04 DIAGNOSIS — D2370 Other benign neoplasm of skin of unspecified lower limb, including hip: Secondary | ICD-10-CM | POA: Diagnosis not present

## 2014-02-04 DIAGNOSIS — D239 Other benign neoplasm of skin, unspecified: Secondary | ICD-10-CM | POA: Diagnosis not present

## 2014-02-04 DIAGNOSIS — Z85828 Personal history of other malignant neoplasm of skin: Secondary | ICD-10-CM | POA: Diagnosis not present

## 2014-02-04 DIAGNOSIS — L988 Other specified disorders of the skin and subcutaneous tissue: Secondary | ICD-10-CM | POA: Diagnosis not present

## 2014-02-05 DIAGNOSIS — D510 Vitamin B12 deficiency anemia due to intrinsic factor deficiency: Secondary | ICD-10-CM | POA: Diagnosis not present

## 2014-02-10 DIAGNOSIS — C61 Malignant neoplasm of prostate: Secondary | ICD-10-CM | POA: Diagnosis not present

## 2014-02-25 ENCOUNTER — Encounter: Payer: Self-pay | Admitting: Neurology

## 2014-02-25 ENCOUNTER — Ambulatory Visit (INDEPENDENT_AMBULATORY_CARE_PROVIDER_SITE_OTHER): Payer: Medicare Other | Admitting: Neurology

## 2014-02-25 VITALS — BP 124/68 | HR 93 | Ht 70.0 in | Wt 179.4 lb

## 2014-02-25 DIAGNOSIS — R269 Unspecified abnormalities of gait and mobility: Secondary | ICD-10-CM | POA: Diagnosis not present

## 2014-02-25 DIAGNOSIS — G589 Mononeuropathy, unspecified: Secondary | ICD-10-CM | POA: Diagnosis not present

## 2014-02-25 DIAGNOSIS — M48061 Spinal stenosis, lumbar region without neurogenic claudication: Secondary | ICD-10-CM | POA: Diagnosis not present

## 2014-02-25 DIAGNOSIS — R209 Unspecified disturbances of skin sensation: Secondary | ICD-10-CM | POA: Diagnosis not present

## 2014-02-25 DIAGNOSIS — G629 Polyneuropathy, unspecified: Secondary | ICD-10-CM

## 2014-02-25 NOTE — Progress Notes (Signed)
Note faxed.

## 2014-02-25 NOTE — Patient Instructions (Addendum)
1. Check blood work 2. Continue gabapentin 300mg  three times daily 3. Start physical therapy for gait and balance training  4. Strongly recommend using cane  5. Return to clinic 48-month  [facility]  Preventing Falls in the Home   Falls are common, often dreaded events in the lives of older people. Aside from the obvious injuries and even death that may result, falls can cause wide-ranging consequences including loss of independence, mental decline, decreased activity, and mobility. Younger people are also at risk of falling, especially those with chronic illnesses and fatigue.  Ways to reduce the risk for falling:  * Examine diet and medications. Warm foods and alcohol dilate blood vessels, which can lead to dizziness when standing. Sleep aids, antidepressants, and pain medications can also increase the likelihood of a fall.  * Get a vison exam. Poor vision, cataracts, and glaucoma increase the chances of falling.  * Check foot gear. Shoes should fit snugly and have a sturdy, nonskid sole and broad, low heel.  * Participate in a physician-approved exercise program to build and maintain muscle strength and improve balance and coordination.  * Increase vitamin D intake. Vitamin D improves muscle strength and increases the amount of calcium the body is able to absorb and deposit in bones.  How to prevent falls from common hazards:  * Floors - Remove all loose wires, cords, and throw rugs. Minimize clutter. Make sure rugs are anchored and smooth. Keep furniture in its usual place.  * Chairs - Use chairs with straight backs, armrests, and firm seats. Add firm cushions to existing pieces to add height.  * Bathroom - Install grab bars and non-skid tape in the tub or shower. Use a bathtub transfer bench or a shower chair with a back support. Use an elevated toilet seat and/or safety rails to assist standing from a low surface. Do not use towel racks or bathroom tissue holders to help you stand.  *  Lighting - Make sure halls, stairways, and entrances are well-lit. Install a night light in your bathroom or hallway. Make sure there is a light switch at the top and bottom of the staircase. Turn lights on if you get up in the middle of the night. Make sure lamps or light switches are within reach of the bed if you have to get up during the night.  * Kitchen - Install non-skid rubber mats near the sink and stove. Clean spills immediately. Store frequently used utensils, pots, and pans between waist and eye level. This helps prevent reaching and bending. Sit when getting things out of the lower cupboards.  * Living room / [place] furniture with wide spaces in between, giving enough room to move around. Establish a route through the living room that gives you something to hold onto as you walk.  * Stairs - Make sure treads, rails, and rugs are secure. Install a rail on both sides of the stairs. If stairs are a threat, it might be helpful to arrange most of your activities on the lower level to reduce the number of times you must climb the stairs.  * Entrances and doorways - Install metal handles on the walls adjacent to the doorknobs of all doors to make it more secure as you travel through the doorway.  Tips for maintaining balance:  * Keep at least one hand free at all times Try using a backpack or fanny pack to hold things rather than carrying them in your hands. Never carry objects  in both hands when walking as this interferes with keeping your balance.  * Attempt to swing both arms from front to back while walking. This might require a conscious effort if Parkinson's disease has diminished your movement. It will, however, help you to maintain balance and posture, and reduce fatigue.  * Consciously lift your feet off the ground when walking. Shuffling and dragging of the feet is a common culprit in losing your balance.  * When trying to navigate turns, use a "U" technique of facing forward and  making a wide turn, rather than pivoting sharply.  * Try to stand with your feet shoulder-length apart. When your feet are close together for any length of time, you increase your risk of losing your balance and falling.  * Do one thing at a time. Do not try to walk and accomplish another task, such as reading or looking around. The decrease in your automatic reflexes complicates motor function, so the less distraction, the better.  * Do not wear rubber or gripping soled shoes, they might "catch" on the floor and cause tripping.  * Move slowly when changing positions. Use deliberate, concentrated movements and, if needed, use a grab bar or walking aid. Count fifteen (15) seconds after standing to begin walking.  * If balance is a continuous problem, you might want to consider a walking aid such as a cane, walking stick, or walker. Once you have mastered walking with help, you may be ready to try it again on your own.  This information is provided by [facility] and is not intended to replace the medical advice of your physician or other health care providers. Please consult your physician or other health care providers for advice regarding your specific medical condition.

## 2014-02-25 NOTE — Progress Notes (Signed)
[facility] Clinic Note - Initial Visit   Date: 02/25/2014  [name] MRN: [id number] DOB: 1929-01-24   Dear Dr. Maxwell Caul:  Thank you for your kind referral of Ryan Humphrey for consultation of peripheral neuropathy. Although his history is well known to you, please allow [name] to reiterate it for the purpose of our medical record. The patient was accompanied to the clinic by self.    History of Present Illness: Ryan Humphrey is a 80 y.o. right-handed Caucasian male with history of hyperlipidemia, arthritis, GERD, depression, prostate cancer, vitamin B12 deficiency, lumbar spine stenosis, and coronary artery disease presenting for evaluation of gait imbalance.    The patient had been seeing Dr. Erling Cruz since 2013 for right-sided nonradiating neck pain. He had previous injections at [facility] pain clinic and by Dr. Mina Marble with no benefit. Most improvement was seen with diclofenac 75 mg twice daily, however due to side effects it was reduced to 50 mg twice daily.  His neck pain slowly improved.  There was also note of low back pain with radiation to the left lower extremity with associated numbness of the feet. At some point she was diagnosed with impaired fasting glucose. He was started gabapentin 300mg  TID and completed PT.  He no longer has any pain, but continues to have problems with balance. His care was then transitioned to Dr. Lonni Fix and patient was last seen in May 2015 for bilateral leg cramps and burning paresthesias. He was taking gabapentin 300 mg 3 times daily.  At that time, MRI studies as well as additional testing was discussed, however patient declined.  Over the past several months, he has noticed increased problems with balance and finds himself using the furniture for assistance, especially at night time.  He also feels as if his legs are weak. He has mild tingling of the toes at night.  He has not had any falls, but does endorse stumbling at times.  He walks independently.  No similar symptoms of the hands.   Past Medical History  Diagnosis Date  . Neuropathy   . Hyperlipidemia   . Arthritis     arthritis,osteopenia,"spinal stenosis"  . GERD (gastroesophageal reflux disease)   . H/O hiatal hernia   . H/O vertigo 12-06-12    none recent  . Anemia   . Depression   . Cancer 12-06-12    Prostate cancer'98  . Peripheral neuropathy 12-06-12    peripheral neuropathy    Past Surgical History  Procedure Laterality Date  . Appendectomy    . Cholecystectomy    . Carpal tunnel release      both hands  . Prostate surgery  12-06-12  . Cataract surgery Left 12-06-12    recent surgery  . Esophagogastroduodenoscopy (egd) with propofol N/A 12/25/2012    Procedure: ESOPHAGOGASTRODUODENOSCOPY (EGD) WITH PROPOFOL;  Surgeon: Garlan Fair, MD;  Location: [facility];  Service: Endoscopy;  Laterality: N/A;  . Colonoscopy with propofol N/A 12/25/2012    Procedure: COLONOSCOPY WITH PROPOFOL;  Surgeon: Garlan Fair, MD;  Location: [facility];  Service: Endoscopy;  Laterality: N/A;     Medications:  Current Outpatient Prescriptions on File Prior to Visit  Medication Sig Dispense Refill  . alendronate (FOSAMAX) 70 MG tablet Take 70 mg by mouth every Saturday. Take with a full glass of water on an empty stomach.      Marland Kitchen aspirin 325 MG tablet Take 325 mg by mouth daily.      . Calcium Carbonate-Vitamin  D (CALTRATE 600+D PO) Take 1 tablet by mouth 2 (two) times daily.      . Cholecalciferol (VITAMIN D) 2000 UNITS tablet Take 2,000 Units by mouth daily.      . diphenoxylate-atropine (LOMOTIL) 2.5-0.025 MG per tablet Take 1 tablet by mouth 4 (four) times daily as needed for diarrhea or loose stools.      . gabapentin (NEURONTIN) 300 MG capsule Take 300 mg by mouth 3 (three) times daily.       . simvastatin (ZOCOR) 20 MG tablet Take 20 mg by mouth every evening.       No current facility-administered medications on file prior to visit.     Allergies:  Allergies  Allergen Reactions  . Penicillins Other (See Comments)    "rash"    Family History: Family History  Problem Relation Age of Onset  . CAD Father     Deceased, 49  . Dementia Mother     Deceased, 54    Social History: History   Social History  . Marital Status: Widowed    Spouse Name: N/A    Number of Children: 2  . Years of Education: College   Occupational History  . Retired    Social History Main Topics  . Smoking status: Former Smoker -- 0.50 packs/day for 30 years    Quit date: 06/14/1963  . Smokeless tobacco: Never Used  . Alcohol Use: Yes     Comment: 1-2 drinks nightly  . Drug Use: No  . Sexual Activity: Not Currently   Other Topics Concern  . Not on file   Social History Narrative   Pt lives at home alone, widowed in 2011, married for 55 years. They have two grown daugthers.   Plays the piano.   Caffeine Use: Rarely    Review of Systems:  CONSTITUTIONAL: No fevers, chills, night sweats, or weight loss.   EYES: No visual changes or eye pain ENT: No hearing changes.  No history of nose bleeds.   RESPIRATORY: No cough, wheezing and shortness of breath.   CARDIOVASCULAR: Negative for chest pain, and palpitations.   GI: Negative for abdominal discomfort, blood in stools or black stools.  No recent change in bowel habits.   GU:  No history of incontinence.   MUSCLOSKELETAL: No history of joint pain or swelling.  No myalgias.   SKIN: Negative for lesions, rash, and itching.   HEMATOLOGY/ONCOLOGY: Negative for prolonged bleeding, bruising easily, and swollen nodes.  +history of cancer.   ENDOCRINE: Negative for cold or heat intolerance, polydipsia or goiter.   PSYCH:  No depression or anxiety symptoms.   NEURO: As Above.   Vital Signs:  BP 124/68  Pulse 93  Ht 5\' 10"  (1.778 m)  Wt 179 lb 6 oz (81.364 kg)  BMI 25.74 kg/m2  SpO2 97%   General Medical Exam:   General:  Well appearing, comfortable.   Eyes/ENT: see cranial  nerve examination.   Neck: No masses appreciated.  Full range of motion without tenderness.  No carotid bruits. Respiratory:  Clear to auscultation, good air entry bilaterally.   Cardiac:  Regular rate and rhythm, no murmur.    Extremities:  No deformities, edema, or skin discoloration. Good capillary refill.   Skin:  Skin color, texture, turgor normal. No rashes or lesions.  Neurological Exam: MENTAL STATUS including orientation to time, place, person, recent and remote memory, attention span and concentration, language, and fund of knowledge is normal.  Speech is not dysarthric.  CRANIAL NERVES:  II:  No visual field defects.  Unremarkable fundi.   III-IV-VI: Pupils equal round and reactive to light.  Normal conjugate, extra-ocular eye movements in all directions of gaze.  No nystagmus.  No ptosis.   V:  Normal facial sensation.     VII:  Normal facial symmetry and movements.   VIII:  Normal hearing and vestibular function.   IX-X:  Normal palatal movement.   XI:  Normal shoulder shrug and head rotation.   XII:  Normal tongue strength and range of motion, no deviation or fasciculation.  MOTOR:  No atrophy, fasciculations or abnormal movements.  No pronator drift.  Tone is normal.    Right Upper Extremity:    Left Upper Extremity:    Deltoid  5/5   Deltoid  5/5   Biceps  5/5   Biceps  5/5   Triceps  5/5   Triceps  5/5   Wrist extensors  5/5   Wrist extensors  5/5   Wrist flexors  5/5   Wrist flexors  5/5   Finger extensors  5/5   Finger extensors  5/5   Finger flexors  5/5   Finger flexors  5/5   Dorsal interossei  5/5   Dorsal interossei  5/5   Abductor pollicis  5/5   Abductor pollicis  5/5   Tone (Ashworth scale)  0  Tone (Ashworth scale)  0   Right Lower Extremity:    Left Lower Extremity:    Hip flexors  5/5   Hip flexors  5/5   Hip extensors  5/5   Hip extensors  5/5   Knee flexors  5/5   Knee flexors  5/5   Knee extensors  5/5   Knee extensors  5/5   Dorsiflexors  5/5    Dorsiflexors  5/5   Plantarflexors  5/5   Plantarflexors  5/5   Toe extensors  5/5   Toe extensors  5/5   Toe flexors  5/5   Toe flexors  5/5   Tone (Ashworth scale)  0  Tone (Ashworth scale)  0   MSRs:  Right                                                                 Left brachioradialis 2+  brachioradialis 2+  biceps 3+  biceps 3+  triceps 2+  triceps 2+  patellar 3+  patellar 1+  ankle jerk 2+  ankle jerk 2+  Hoffman no  Hoffman no  plantar response down  plantar response down  Crossed adductors bilaterally 2-3 ankle clonus on the right  SENSORY:  Reduced vibration, temperature, and pin prick at ankles bilaterally (R >>L).  Proprioception intact. Romberg's sign mildly positive.   COORDINATION/GAIT: Normal finger-to- nose-finger and heel-to-shin.  Intact rapid alternating movements bilaterally.  Able to rise from a chair without using arms.  Gait wide-based and stable.  Unable to perform tandem or stressed gait.   Data: Labs 10/04/2012: Magnesium 2.5 sodium 142, potassium 4.8, creatinine 1.1  04/13/07 MRI cervical spine - congenital fusion at C6-7 with residual uncovertebral disease on the left causing mild to moderate left foraminal stenosis, advanced spondylosis at C3-C4 through C5-C6 with mild foraminal narrowing at each of those levels worse on the right with a disc  contacting and deforming the cord at each of those levels.   10/28/10 MRI lumbar spine - mild to moderate spinal stenosis at L1-2, moderate to marked left-sided and moderate right-sided spinal stenosis at L2-3, moderate to marked spinal stenosis and bilateral lateral recess stenosis at L3-4, mild spinal stenosis greater on the right and mild left foraminal narrowing at L4-5, broad-based disc osteophyte ridge and left mild indentation of the thecal sac at L5-S1 with mild extension to the left but no compression of the L5 nerve root.  IMPRESSION: Mr. Sloane is a delightful 80 year-old gentleman presenting for  evaluation of numbness of his feet and gait instability.  Although he does have evidence of distal large fiber sensory loss involving the feet, consistent with peripheral neuropathy, he also has evidence myelopathic features due to know cervical and lumbar stenosis.  There is slight asymmetry especially with reduced left patella jerk, which is due to foraminal stenosis at this level.    I had extensive discussion with the patient regarding the pathogenesis, etiology, management, and natural course of neuropathy. Neuropathy tends to be slowly progressive, especially if a treatable etiology is not identified.  I would like to test for treatable causes of neuropathy. I discussed that in the vast majority of cases, despite checking for reversible causes, we are unable to find the underlying etiology and management is symptomatic, which is most likely the case here.  His primary complaint is gait unsteadiness, so I will refer him to PT for gait training.  Fortunately, pain is well controlled on gabapentin 300mg  TID.   PLAN/RECOMMENDATIONS:  1. Check TSH, vitamin B12, copper, ceruloplasmin, folate, vitamin E, vitamin B1, and vitamin B6 2. Continue gabapentin 300mg  three times daily 3. Start physical therapy for gait and balance training  4. Strongly recommend using cane  5. Fall precautions discussed 6. Return to clinic in 2 months.   The duration of this appointment visit was 45 minutes of face-to-face time with the patient.  Greater than 50% of this time was spent in counseling, explanation of diagnosis, planning of further management, and coordination of care.   Thank you for allowing me to participate in patient's care.  If I can answer any additional questions, I would be pleased to do so.    Sincerely,    Donika K. Posey Pronto, DO

## 2014-02-26 ENCOUNTER — Encounter: Payer: Self-pay | Admitting: *Deleted

## 2014-02-26 LAB — VITAMIN B12: Vitamin B-12: 473 pg/mL (ref 211–911)

## 2014-02-26 LAB — TSH: TSH: 2.638 u[IU]/mL (ref 0.350–4.500)

## 2014-03-03 DIAGNOSIS — R269 Unspecified abnormalities of gait and mobility: Secondary | ICD-10-CM | POA: Diagnosis not present

## 2014-03-06 DIAGNOSIS — R269 Unspecified abnormalities of gait and mobility: Secondary | ICD-10-CM | POA: Diagnosis not present

## 2014-03-10 DIAGNOSIS — D510 Vitamin B12 deficiency anemia due to intrinsic factor deficiency: Secondary | ICD-10-CM | POA: Diagnosis not present

## 2014-03-10 DIAGNOSIS — R269 Unspecified abnormalities of gait and mobility: Secondary | ICD-10-CM | POA: Diagnosis not present

## 2014-03-13 DIAGNOSIS — M6281 Muscle weakness (generalized): Secondary | ICD-10-CM | POA: Diagnosis not present

## 2014-03-13 DIAGNOSIS — R262 Difficulty in walking, not elsewhere classified: Secondary | ICD-10-CM | POA: Diagnosis not present

## 2014-03-20 DIAGNOSIS — Z23 Encounter for immunization: Secondary | ICD-10-CM | POA: Diagnosis not present

## 2014-04-10 DIAGNOSIS — D510 Vitamin B12 deficiency anemia due to intrinsic factor deficiency: Secondary | ICD-10-CM | POA: Diagnosis not present

## 2014-04-29 ENCOUNTER — Ambulatory Visit (INDEPENDENT_AMBULATORY_CARE_PROVIDER_SITE_OTHER): Payer: Medicare Other | Admitting: Neurology

## 2014-04-29 ENCOUNTER — Encounter: Payer: Self-pay | Admitting: Neurology

## 2014-04-29 VITALS — BP 120/70 | HR 94 | Ht 71.0 in | Wt 183.2 lb

## 2014-04-29 DIAGNOSIS — G609 Hereditary and idiopathic neuropathy, unspecified: Secondary | ICD-10-CM

## 2014-04-29 DIAGNOSIS — G3184 Mild cognitive impairment, so stated: Secondary | ICD-10-CM | POA: Diagnosis not present

## 2014-04-29 DIAGNOSIS — M4806 Spinal stenosis, lumbar region: Secondary | ICD-10-CM

## 2014-04-29 DIAGNOSIS — M48061 Spinal stenosis, lumbar region without neurogenic claudication: Secondary | ICD-10-CM

## 2014-04-29 DIAGNOSIS — R269 Unspecified abnormalities of gait and mobility: Secondary | ICD-10-CM

## 2014-04-29 DIAGNOSIS — G250 Essential tremor: Secondary | ICD-10-CM

## 2014-04-29 MED ORDER — PROPRANOLOL HCL 20 MG PO TABS: 20.0000 mg | ORAL_TABLET | Freq: Two times a day (BID) | ORAL | Status: DC

## 2014-04-29 MED ORDER — DONEPEZIL HCL 5 MG PO TABS: ORAL_TABLET | ORAL | Status: DC

## 2014-04-29 NOTE — Patient Instructions (Addendum)
1.  Continue gabapentin 300mg  three times daily, if you have severe pain on occasion, you can take an extra tablet 2.  Start aricept 5mg  daily for one month, then increase to 10mg  daily (call my office when you are taking 10mg , since it comes are 10mg  tablets) 3.  Start propranolol 20mg  twice daily for your tremors 4.  Return to clinic 78-months

## 2014-04-29 NOTE — Progress Notes (Signed)
Follow-up Visit   Date: 04/29/2014  ABDULAHAD MEDEROS MRN: [id number] DOB: [birth year]   Interim History: Ryan Humphrey is a 80 y.o. right-handed Caucasian male with hyperlipidemia, arthritis, GERD, depression, prostate cancer, vitamin B12 deficiency, lumbar spine stenosis, and coronary artery disease returning to the clinic for follow-up of gait imbalance, new memory changes, and tremors.    History of present illness: The patient had been seeing Dr. Erling Cruz since 2013 for right-sided nonradiating neck pain. He had previous injections at [facility] pain clinic and by Dr. Mina Marble with no benefit. Most improvement was seen with diclofenac 75 mg twice daily, however due to side effects it was reduced to 50 mg twice daily. His neck pain slowly improved. There was also note of low back pain with radiation to the left lower extremity with associated numbness of the feet. At some point she was diagnosed with impaired fasting glucose. He was started gabapentin 300mg  TID and completed PT. He no longer has any pain, but continues to have problems with balance. His care was then transitioned to Dr. Lonni Fix and patient was last seen in May 2015 for bilateral leg cramps and burning paresthesias. He was taking gabapentin 300 mg 3 times daily. At that time, MRI studies as well as additional testing was discussed, however patient declined.  Over the past several months, he has noticed increased problems with balance and finds himself using the furniture for assistance, especially at night time. He also feels as if his legs are weak. He has mild tingling of the toes at night. He has not had any falls, but does endorse stumbling at times. He walks independently. No similar symptoms of the hands.  UPDATE 04/29/2014:  He completed therapy and report no significant change, but was taught how to use the cane better.  He had on night of severe burning pain, but otherwise symptoms are relatively stable.  Denies any  falls.  He reports to having short-term memory problems and has to try harder to recall things, especially peoples names.  He also reports to have illegible hand writing and a tremor when he is trying to do fine motor tasks.  He is still living alone and taking care of his finances as well as driving without any difficulty.   Medications:  Current Outpatient Prescriptions on File Prior to Visit  Medication Sig Dispense Refill  . alendronate (FOSAMAX) 70 MG tablet Take 70 mg by mouth every Saturday. Take with a full glass of water on an empty stomach.    . Calcium Carbonate-Vitamin D (CALTRATE 600+D PO) Take 1 tablet by mouth 2 (two) times daily.    . Cholecalciferol (VITAMIN D) 2000 UNITS tablet Take 2,000 Units by mouth daily.    . diphenoxylate-atropine (LOMOTIL) 2.5-0.025 MG per tablet Take 1 tablet by mouth 4 (four) times daily as needed for diarrhea or loose stools.    . gabapentin (NEURONTIN) 300 MG capsule Take 300 mg by mouth 3 (three) times daily.     . simvastatin (ZOCOR) 20 MG tablet Take 20 mg by mouth every evening.     No current facility-administered medications on file prior to visit.    Allergies:  Allergies  Allergen Reactions  . Penicillins Other (See Comments)    "rash"     Review of Systems:  CONSTITUTIONAL: No fevers, chills, night sweats, or weight loss.   EYES: No visual changes or eye pain ENT: No hearing changes.  No history of nose bleeds.   RESPIRATORY: No cough,  wheezing and shortness of breath.   CARDIOVASCULAR: Negative for chest pain, and palpitations.   GI: Negative for abdominal discomfort, blood in stools or black stools.  No recent change in bowel habits.   GU:  No history of incontinence.   MUSCLOSKELETAL: No history of joint pain or swelling.  No myalgias.   SKIN: Negative for lesions, rash, and itching.   ENDOCRINE: Negative for cold or heat intolerance, polydipsia or goiter.   PSYCH:  No depression or anxiety symptoms.   NEURO: As  Above.   Vital Signs:  BP 120/70 mmHg  Pulse 94  Ht 5\' 11"  (1.803 m)  Wt 183 lb 4 oz (83.122 kg)  BMI 25.57 kg/m2  SpO2 96%  Neurological Exam: MENTAL STATUS including orientation to time, place, person, recent and remote memory, attention span and concentration, language, and fund of knowledge is relatively intact.  Speech is not dysarthric.  Montreal Cognitive Assessment  04/29/2014  Visuospatial/ Executive (0/5) 5  Naming (0/3) 2  Attention: Read list of digits (0/2) 2  Attention: Read list of letters (0/1) 1  Attention: Serial 7 subtraction starting at 100 (0/3) 3  Language: Repeat phrase (0/2) 2  Language : Fluency (0/1) 1  Abstraction (0/2) 2  Delayed Recall (0/5) 3  Orientation (0/6) 6  Total 27  Adjusted Score (based on education) 27    CRANIAL NERVES:  Pupils equal round and reactive to light.  Normal conjugate, extra-ocular eye movements in all directions of gaze.  No ptosis.  Face is symmetric. Palate elevates symmetrically.  Tongue is midline.  MOTOR:  Motor strength is 5/5 in all extremities. No pronator drift.  Tone is normal. Mild postural right hand tremor, more noticeable with finger to nose testing and when writing.   SENSORY:  Reduced vibration distal to ankles bilaterally.  COORDINATION/GAIT:  Normal finger-to- nose-finger and heel-to-shin.  Intact rapid alternating movements bilaterally.  Gait is assisted with cane and appears mildly antalgic (right knee pain). Turns within 3 steps.  Data: Labs 10/04/2012: Magnesium 2.5 sodium 142, potassium 4.8, creatinine 1.1 Labs 04/29/2014:  Vitamin B12 473, TSH 2.6  04/13/07 MRI cervical spine - congenital fusion at C6-7 with residual uncovertebral disease on the left causing mild to moderate left foraminal stenosis, advanced spondylosis at C3-C4 through C5-C6 with mild foraminal narrowing at each of those levels worse on the right with a disc contacting and deforming the cord at each of those levels.   10/28/10 MRI  lumbar spine - mild to moderate spinal stenosis at L1-2, moderate to marked left-sided and moderate right-sided spinal stenosis at L2-3, moderate to marked spinal stenosis and bilateral lateral recess stenosis at L3-4, mild spinal stenosis greater on the right and mild left foraminal narrowing at L4-5, broad-based disc osteophyte ridge and left mild indentation of the thecal sac at L5-S1 with mild extension to the left but no compression of the L5 nerve root.  IMPRESSION/PLAN: 1.  Mild cognitive impairment - new problem He scored remarkably well on his MoCA (27/30) and deficits do not seem to interfere with daily activities Start aricept  5mg  daily x 1 month, then increase to 10mg  daily  2.  Benign essential tremors - new problem Start propranolol 20mg  BID, uptitrate as needed  3.  Multifactorial gait instability from idiopathic peripheral neuropathy and known cervical and lumbar stenosis with myelopathy Clinically stable Fall precautions recommended Continue gabapentin 300mg  TID, ok to take extra dose for episodes of severe pain  4.  Return to clinic 61-months  The duration of this appointment visit was 40 minutes of face-to-face time with the patient.  Greater than 50% of this time was spent in counseling, explanation of diagnosis, planning of further management, and coordination of care.   Thank you for allowing me to participate in patient's care.  If I can answer any additional questions, I would be pleased to do so.    Sincerely,    Donika K. Posey Pronto, DO

## 2014-04-29 NOTE — Progress Notes (Signed)
Note faxed.

## 2014-05-05 DIAGNOSIS — M170 Bilateral primary osteoarthritis of knee: Secondary | ICD-10-CM | POA: Diagnosis not present

## 2014-05-05 DIAGNOSIS — M545 Low back pain: Secondary | ICD-10-CM | POA: Diagnosis not present

## 2014-05-12 DIAGNOSIS — D510 Vitamin B12 deficiency anemia due to intrinsic factor deficiency: Secondary | ICD-10-CM | POA: Diagnosis not present

## 2014-05-19 DIAGNOSIS — Z923 Personal history of irradiation: Secondary | ICD-10-CM | POA: Diagnosis not present

## 2014-05-19 DIAGNOSIS — Z961 Presence of intraocular lens: Secondary | ICD-10-CM | POA: Diagnosis not present

## 2014-05-19 DIAGNOSIS — N393 Stress incontinence (female) (male): Secondary | ICD-10-CM | POA: Diagnosis not present

## 2014-05-19 DIAGNOSIS — N320 Bladder-neck obstruction: Secondary | ICD-10-CM | POA: Diagnosis not present

## 2014-05-19 DIAGNOSIS — C61 Malignant neoplasm of prostate: Secondary | ICD-10-CM | POA: Diagnosis not present

## 2014-05-19 DIAGNOSIS — Z87891 Personal history of nicotine dependence: Secondary | ICD-10-CM | POA: Diagnosis not present

## 2014-05-19 DIAGNOSIS — H10509 Unspecified blepharoconjunctivitis, unspecified eye: Secondary | ICD-10-CM | POA: Diagnosis not present

## 2014-05-20 ENCOUNTER — Encounter (HOSPITAL_COMMUNITY): Payer: Self-pay | Admitting: Emergency Medicine

## 2014-05-20 ENCOUNTER — Emergency Department (HOSPITAL_COMMUNITY)
Admission: EM | Admit: 2014-05-20 | Discharge: 2014-05-20 | Disposition: A | Discharge status: Home / Self Care | Payer: Medicare Other | Attending: Emergency Medicine | Admitting: Emergency Medicine

## 2014-05-20 ENCOUNTER — Emergency Department (HOSPITAL_COMMUNITY): Payer: Medicare Other

## 2014-05-20 DIAGNOSIS — Z8739 Personal history of other diseases of the musculoskeletal system and connective tissue: Secondary | ICD-10-CM | POA: Insufficient documentation

## 2014-05-20 DIAGNOSIS — R002 Palpitations: Secondary | ICD-10-CM | POA: Insufficient documentation

## 2014-05-20 DIAGNOSIS — E785 Hyperlipidemia, unspecified: Secondary | ICD-10-CM | POA: Insufficient documentation

## 2014-05-20 DIAGNOSIS — Z79899 Other long term (current) drug therapy: Secondary | ICD-10-CM | POA: Diagnosis not present

## 2014-05-20 DIAGNOSIS — K219 Gastro-esophageal reflux disease without esophagitis: Secondary | ICD-10-CM | POA: Insufficient documentation

## 2014-05-20 DIAGNOSIS — Z8659 Personal history of other mental and behavioral disorders: Secondary | ICD-10-CM | POA: Diagnosis not present

## 2014-05-20 DIAGNOSIS — G629 Polyneuropathy, unspecified: Secondary | ICD-10-CM | POA: Diagnosis not present

## 2014-05-20 DIAGNOSIS — Z8546 Personal history of malignant neoplasm of prostate: Secondary | ICD-10-CM | POA: Diagnosis not present

## 2014-05-20 DIAGNOSIS — R079 Chest pain, unspecified: Secondary | ICD-10-CM | POA: Diagnosis not present

## 2014-05-20 DIAGNOSIS — Z880 Allergy status to penicillin: Secondary | ICD-10-CM | POA: Insufficient documentation

## 2014-05-20 DIAGNOSIS — R072 Precordial pain: Secondary | ICD-10-CM | POA: Insufficient documentation

## 2014-05-20 DIAGNOSIS — Z87891 Personal history of nicotine dependence: Secondary | ICD-10-CM | POA: Diagnosis not present

## 2014-05-20 DIAGNOSIS — Z862 Personal history of diseases of the blood and blood-forming organs and certain disorders involving the immune mechanism: Secondary | ICD-10-CM | POA: Insufficient documentation

## 2014-05-20 LAB — CBC
HCT: 36.7 % — ABNORMAL LOW (ref 39.0–52.0)
HEMOGLOBIN: 12.3 g/dL — AB (ref 13.0–17.0)
MCH: 32 pg (ref 26.0–34.0)
MCHC: 33.5 g/dL (ref 30.0–36.0)
MCV: 95.6 fL (ref 78.0–100.0)
PLATELETS: 112 10*3/uL — AB (ref 150–400)
RBC: 3.84 MIL/uL — AB (ref 4.22–5.81)
RDW: 12.7 % (ref 11.5–15.5)
WBC: 7.2 10*3/uL (ref 4.0–10.5)

## 2014-05-20 LAB — BASIC METABOLIC PANEL
Anion gap: 11 (ref 5–15)
BUN: 20 mg/dL (ref 6–23)
CHLORIDE: 107 meq/L (ref 96–112)
CO2: 27 mEq/L (ref 19–32)
Calcium: 8.8 mg/dL (ref 8.4–10.5)
Creatinine, Ser: 1.33 mg/dL (ref 0.50–1.35)
GFR, EST AFRICAN AMERICAN: 55 mL/min — AB (ref >90)
GFR, EST NON AFRICAN AMERICAN: 48 mL/min — AB (ref >90)
Glucose, Bld: 113 mg/dL — ABNORMAL HIGH (ref 70–99)
Potassium: 4.9 mEq/L (ref 3.7–5.3)
SODIUM: 145 meq/L (ref 137–147)

## 2014-05-20 LAB — I-STAT TROPONIN, ED
TROPONIN I, POC: 0 ng/mL (ref 0.00–0.08)
TROPONIN I, POC: 0 ng/mL (ref 0.00–0.08)

## 2014-05-20 MED ORDER — ACETAMINOPHEN 325 MG PO TABS: 650.0000 mg | ORAL_TABLET | Freq: Once | ORAL | Status: DC

## 2014-05-20 MED ORDER — NITROGLYCERIN 0.4 MG SL SUBL: 0.4000 mg | SUBLINGUAL_TABLET | SUBLINGUAL | Status: DC | PRN

## 2014-05-20 NOTE — ED Notes (Signed)
PER EMS, PT REPORTS EPISODE OF LIGHTHEADEDNESS, DROVE SELF TO FIRESTATION, AND WAS TRANSPORTED TO ED.  PT REPORTS TAKING 1 HOME NITRO WITH NO CHANGE, REPORTED 2/10 CHEST PAIN FOR EMS, GIVEN 324 ASA AND 1 NITRO VIA EMS.  PT ARRIVES AWAKE, ALERT, ORIENTED, NO COMPLAINTS AT THIS TIME

## 2014-05-20 NOTE — Discharge Instructions (Signed)

## 2014-05-20 NOTE — ED Provider Notes (Signed)
CSN: [id number]     Arrival date & time 05/20/14  1447 History   First MD Initiated Contact with Patient 05/20/14 1505     Chief Complaint  Patient presents with  . Chest Pain     (Consider location/radiation/quality/duration/timing/severity/associated sxs/prior Treatment) HPI the patient is an 80 year old male with history of hyperlipidemia, hypertension, and coronary artery disease who presents complaining of chest pain. He said he had a gradual onset of left-sided chest pressure, that he said felt like palpitations, approximately an hour prior to his arrival. He said it was 2 out of 10 in severity at its worst. He went to a fire station to have his blood pressure checked, then EMS was called. He received aspirin and one dose of nitroglycerin with EMS which completely resolved his pain. He is pain-free now. He denies any diaphoresis or shortness of breath. He has had no cough or fever recently.    Past Medical History  Diagnosis Date  . Neuropathy   . Hyperlipidemia   . Arthritis     arthritis,osteopenia,"spinal stenosis"  . GERD (gastroesophageal reflux disease)   . H/O hiatal hernia   . H/O vertigo 12-06-12    none recent  . Anemia   . Depression   . Cancer 12-06-12    Prostate cancer'98  . Peripheral neuropathy 12-06-12    peripheral neuropathy   Past Surgical History  Procedure Laterality Date  . Appendectomy    . Cholecystectomy    . Carpal tunnel release      both hands  . Prostate surgery  12-06-12  . Cataract surgery Left 12-06-12    recent surgery  . Esophagogastroduodenoscopy (egd) with propofol N/A 12/25/2012    Procedure: ESOPHAGOGASTRODUODENOSCOPY (EGD) WITH PROPOFOL;  Surgeon: Garlan Fair, MD;  Location: [facility];  Service: Endoscopy;  Laterality: N/A;  . Colonoscopy with propofol N/A 12/25/2012    Procedure: COLONOSCOPY WITH PROPOFOL;  Surgeon: Garlan Fair, MD;  Location: [facility];  Service: Endoscopy;  Laterality: N/A;   Family History   Problem Relation Age of Onset  . CAD Father     Deceased, 55  . Dementia Mother     Deceased, 59   History  Substance Use Topics  . Smoking status: Former Smoker -- 0.50 packs/day for 30 years    Quit date: 06/14/1963  . Smokeless tobacco: Never Used  . Alcohol Use: Yes     Comment: 1-2 drinks nightly    Review of Systems  Constitutional: Negative for fever and fatigue.  Respiratory: Negative for shortness of breath.   Cardiovascular: Positive for chest pain and palpitations. Negative for leg swelling.  Gastrointestinal: Negative for abdominal pain and abdominal distention.  Musculoskeletal: Negative for back pain and arthralgias.  All other systems reviewed and are negative.     Allergies  Penicillins  Home Medications   Prior to Admission medications   Medication Sig Start Date End Date Taking? Authorizing Provider  alendronate (FOSAMAX) 70 MG tablet Take 70 mg by mouth every Saturday. Take with a full glass of water on an empty stomach.   Yes Historical Provider, MD  Calcium Carbonate-Vitamin D (CALTRATE 600+D PO) Take 1 tablet by mouth 2 (two) times daily.   Yes Historical Provider, MD  Cholecalciferol (VITAMIN D) 2000 UNITS tablet Take 2,000 Units by mouth daily.   Yes Historical Provider, MD  cyanocobalamin (,VITAMIN B-12,) 1000 MCG/ML injection Inject 1,000 mcg into the muscle every 30 (thirty) days. Last dose was 05-12-14   Yes Historical Provider, MD  diphenoxylate-atropine (LOMOTIL) 2.5-0.025 MG per tablet Take 1 tablet by mouth 4 (four) times daily as needed for diarrhea or loose stools.   Yes Historical Provider, MD  donepezil (ARICEPT) 5 MG tablet Take one tablet at bedtime x one month, then take two tablets at bedtime. 04/29/14  Yes Donika K Patel, DO  gabapentin (NEURONTIN) 300 MG capsule Take 300 mg by mouth 3 (three) times daily.    Yes Historical Provider, MD  propranolol (INDERAL) 20 MG tablet Take 1 tablet (20 mg total) by mouth 2 (two) times daily.  04/29/14  Yes Donika K Patel, DO  simvastatin (ZOCOR) 20 MG tablet Take 20 mg by mouth every evening.   Yes Historical Provider, MD  Cyanocobalamin (VITAMIN B-12 IJ) Inject as directed.    Historical Provider, MD  FLUZONE HIGH-DOSE 0.5 ML SUSY  03/20/14   Historical Provider, MD   BP 117/52 mmHg  Pulse 77  Resp 17  SpO2 97% Physical Exam  Constitutional: He is oriented to person, place, and time. He appears well-developed and well-nourished. No distress.  HENT:  Head: Normocephalic and atraumatic.  Eyes: Pupils are equal, round, and reactive to light.  Neck: Normal range of motion.  Cardiovascular: Normal rate, regular rhythm and normal heart sounds.   No murmur heard. Pulmonary/Chest: Effort normal and breath sounds normal. No respiratory distress. He has no wheezes.  Abdominal: Soft. There is no tenderness.  Musculoskeletal: Normal range of motion. He exhibits no edema.  Neurological: He is alert and oriented to person, place, and time.  Skin: Skin is warm and dry.  Psychiatric: He has a normal mood and affect.  Nursing note and vitals reviewed.   ED Course  Procedures (including critical care time) Labs Review Labs Reviewed  BASIC METABOLIC PANEL - Abnormal; Notable for the following:    Glucose, Bld 113 (*)    GFR calc non Af Amer 48 (*)    GFR calc Af Amer 55 (*)    All other components within normal limits  CBC - Abnormal; Notable for the following:    RBC 3.84 (*)    Hemoglobin 12.3 (*)    HCT 36.7 (*)    Platelets 112 (*)    All other components within normal limits  I-STAT TROPOININ, ED  I-STAT TROPOININ, ED    Imaging Review Dg Chest 2 View  05/20/2014   CLINICAL DATA:  Chronic chest pain for few days, history of small bowel obstruction  EXAM: CHEST  2 VIEW  COMPARISON:  03/15/2009  FINDINGS: There is bilateral mild chronic interstitial thickening. There is no focal parenchymal opacity, pleural effusion, or pneumothorax. The heart and mediastinal contours are  unremarkable.  The osseous structures are unremarkable.  IMPRESSION: No active cardiopulmonary disease.   Electronically Signed   By: Kathreen Devoid   On: 05/20/2014 17:01     EKG Interpretation   Date/Time:  Tuesday May 20 2014 15:05:36 EST Ventricular Rate:  77 PR Interval:  154 QRS Duration: 135 QT Interval:  401 QTC Calculation: 454 R Axis:   9 Text Interpretation:  Sinus rhythm Right bundle branch block similar to  previous Confirmed by ZAVITZ  MD, JOSHUA ([id number]) on 05/20/2014 5:18:37 PM      MDM   Final diagnoses:  Precordial pain   80 year old male presents with left-sided chest pain. Patient is pain-free on arrival to the emergency department, EKG reviewed, sinus rhythm with right bundle branch block, no CVA change from previous, no signs of ischemia. Pain is not reproducible  to palpation, no pleuritic component suggestive of pulmonary embolism. Labs reviewed and unremarkable. HEART score 3, 2 negative troponins, patient observed with no recurrence of pain. Discussed discharge versus observation with the patient who through sure decision-making felt outpatient follow-up was a reasonable option, and I agree. Patient does have a remote history of angina which was treated with nitroglycerin, but is not needed several years. This may be a recurrence of angina. We'll have him follow-up with his cardiologist, Dr. Harrington Challenger, within the week.    Leata Mouse, MD 05/21/14 [id number]  Mariea Clonts, MD 05/21/14 [phone]

## 2014-05-23 DIAGNOSIS — M47816 Spondylosis without myelopathy or radiculopathy, lumbar region: Secondary | ICD-10-CM | POA: Diagnosis not present

## 2014-05-26 DIAGNOSIS — M170 Bilateral primary osteoarthritis of knee: Secondary | ICD-10-CM | POA: Diagnosis not present

## 2014-05-30 DIAGNOSIS — M545 Low back pain: Secondary | ICD-10-CM | POA: Diagnosis not present

## 2014-06-12 DIAGNOSIS — D510 Vitamin B12 deficiency anemia due to intrinsic factor deficiency: Secondary | ICD-10-CM | POA: Diagnosis not present

## 2014-06-23 ENCOUNTER — Encounter (HOSPITAL_COMMUNITY): Payer: Self-pay | Admitting: Family Medicine

## 2014-06-23 ENCOUNTER — Emergency Department (HOSPITAL_COMMUNITY)
Admission: EM | Admit: 2014-06-23 | Discharge: 2014-06-23 | Disposition: A | Discharge status: Home / Self Care | Payer: Medicare Other | Attending: Emergency Medicine | Admitting: Emergency Medicine

## 2014-06-23 DIAGNOSIS — Z9089 Acquired absence of other organs: Secondary | ICD-10-CM | POA: Insufficient documentation

## 2014-06-23 DIAGNOSIS — Z880 Allergy status to penicillin: Secondary | ICD-10-CM | POA: Insufficient documentation

## 2014-06-23 DIAGNOSIS — Z87891 Personal history of nicotine dependence: Secondary | ICD-10-CM | POA: Insufficient documentation

## 2014-06-23 DIAGNOSIS — Z8719 Personal history of other diseases of the digestive system: Secondary | ICD-10-CM | POA: Diagnosis not present

## 2014-06-23 DIAGNOSIS — D649 Anemia, unspecified: Secondary | ICD-10-CM | POA: Diagnosis not present

## 2014-06-23 DIAGNOSIS — Z8546 Personal history of malignant neoplasm of prostate: Secondary | ICD-10-CM | POA: Diagnosis not present

## 2014-06-23 DIAGNOSIS — R197 Diarrhea, unspecified: Secondary | ICD-10-CM | POA: Diagnosis not present

## 2014-06-23 DIAGNOSIS — Z8659 Personal history of other mental and behavioral disorders: Secondary | ICD-10-CM | POA: Diagnosis not present

## 2014-06-23 DIAGNOSIS — Z79899 Other long term (current) drug therapy: Secondary | ICD-10-CM | POA: Diagnosis not present

## 2014-06-23 DIAGNOSIS — M1990 Unspecified osteoarthritis, unspecified site: Secondary | ICD-10-CM | POA: Insufficient documentation

## 2014-06-23 DIAGNOSIS — R109 Unspecified abdominal pain: Secondary | ICD-10-CM | POA: Diagnosis present

## 2014-06-23 DIAGNOSIS — Z8669 Personal history of other diseases of the nervous system and sense organs: Secondary | ICD-10-CM | POA: Insufficient documentation

## 2014-06-23 DIAGNOSIS — K529 Noninfective gastroenteritis and colitis, unspecified: Secondary | ICD-10-CM

## 2014-06-23 DIAGNOSIS — E785 Hyperlipidemia, unspecified: Secondary | ICD-10-CM | POA: Insufficient documentation

## 2014-06-23 LAB — CBC WITH DIFFERENTIAL/PLATELET
BASOS PCT: 0 % (ref 0–1)
Basophils Absolute: 0 10*3/uL (ref 0.0–0.1)
EOS ABS: 0.1 10*3/uL (ref 0.0–0.7)
EOS PCT: 2 % (ref 0–5)
HCT: 38.4 % — ABNORMAL LOW (ref 39.0–52.0)
Hemoglobin: 13.3 g/dL (ref 13.0–17.0)
LYMPHS ABS: 0.7 10*3/uL (ref 0.7–4.0)
Lymphocytes Relative: 13 % (ref 12–46)
MCH: 32 pg (ref 26.0–34.0)
MCHC: 34.6 g/dL (ref 30.0–36.0)
MCV: 92.5 fL (ref 78.0–100.0)
Monocytes Absolute: 0.5 10*3/uL (ref 0.1–1.0)
Monocytes Relative: 9 % (ref 3–12)
NEUTROS PCT: 76 % (ref 43–77)
Neutro Abs: 4.2 10*3/uL (ref 1.7–7.7)
Platelets: 112 10*3/uL — ABNORMAL LOW (ref 150–400)
RBC: 4.15 MIL/uL — ABNORMAL LOW (ref 4.22–5.81)
RDW: 12.6 % (ref 11.5–15.5)
WBC: 5.5 10*3/uL (ref 4.0–10.5)

## 2014-06-23 LAB — COMPREHENSIVE METABOLIC PANEL
ALT: 18 U/L (ref 0–53)
AST: 20 U/L (ref 0–37)
Albumin: 3.9 g/dL (ref 3.5–5.2)
Alkaline Phosphatase: 65 U/L (ref 39–117)
Anion gap: 5 (ref 5–15)
BUN: 13 mg/dL (ref 6–23)
CALCIUM: 8.7 mg/dL (ref 8.4–10.5)
CO2: 27 mmol/L (ref 19–32)
Chloride: 108 mEq/L (ref 96–112)
Creatinine, Ser: 1.22 mg/dL (ref 0.50–1.35)
GFR calc non Af Amer: 53 mL/min — ABNORMAL LOW (ref >90)
GFR, EST AFRICAN AMERICAN: 61 mL/min — AB (ref >90)
Glucose, Bld: 105 mg/dL — ABNORMAL HIGH (ref 70–99)
Potassium: 3.6 mmol/L (ref 3.5–5.1)
SODIUM: 140 mmol/L (ref 135–145)
TOTAL PROTEIN: 6.1 g/dL (ref 6.0–8.3)
Total Bilirubin: 0.9 mg/dL (ref 0.3–1.2)

## 2014-06-23 MED ORDER — DICYCLOMINE HCL 20 MG PO TABS: 20.0000 mg | ORAL_TABLET | Freq: Three times a day (TID) | ORAL | Status: DC

## 2014-06-23 MED ORDER — SODIUM CHLORIDE 0.9 % IV BOLUS (SEPSIS)
1000.0000 mL | Freq: Once | INTRAVENOUS | Status: AC
  Administered 2014-06-23: 1000 mL via INTRAVENOUS

## 2014-06-23 NOTE — Discharge Instructions (Signed)
Chronic Diarrhea  Diarrhea is frequent loose and watery bowel movements. It can cause you to feel weak and dehydrated. Dehydration can cause you to become tired and thirsty and to have a dry mouth, decreased urination, and dark yellow urine. Diarrhea is a sign of another problem, most often an infection that will not last long. In most cases, diarrhea lasts 2-3 days. Diarrhea that lasts longer than 4 weeks is called long-lasting (chronic) diarrhea. It is important to treat your diarrhea as directed by your health care provider to lessen or prevent future episodes of diarrhea.   CAUSES   There are many causes of chronic diarrhea. The following are some possible causes:   · Gastrointestinal infections caused by viruses, bacteria, or parasites.    · Food poisoning or food allergies.    · Certain medicines, such as antibiotics, chemotherapy, and laxatives.    · Artificial sweeteners and fructose.    · Digestive disorders, such as celiac disease and inflammatory bowel diseases.    · Irritable bowel syndrome.  · Some disorders of the pancreas.  · Disorders of the thyroid.  · Reduced blood flow to the intestines.  · Cancer.  Sometimes the cause of chronic diarrhea is unknown.  RISK FACTORS  · Having a severely weakened immune system, such as from HIV or AIDS.    · Taking certain types of cancer-fighting drugs (such as with chemotherapy) or other medicines.    · Having had a recent organ transplant.    · Having a portion of the stomach or small bowel removed.    · Traveling to countries where food and water supplies are often contaminated.    SYMPTOMS   In addition to frequent, loose stools, diarrhea may cause:   · Cramping.    · Abdominal pain.    · Nausea.    · Fever.  · Fatigue.  · Urgent need to use the bathroom.  · Loss of bowel control.  DIAGNOSIS   Your health care provider must take a careful history and perform a physical exam. Tests given are based on your symptoms and history. Tests may include:   · Blood or  stool tests. Three or more stool samples may be examined. Stool cultures may be used to test for bacteria or parasites.    · X-rays.    · A procedure in which a thin tube is inserted into the mouth or rectum (endoscopy). This allows the health care provider to look inside the intestine.    TREATMENT   · Treatment is aimed at correcting the cause of the diarrhea when possible.  · Diarrhea caused by an infection can often be treated with antibiotic medicines.  · Diarrhea not caused by an infection may require you to take long-term medicine or have surgery. Specific treatment should be discussed with your health care provider.  · If the cause cannot be determined, treatment aims to relieve symptoms and prevent dehydration. Serious health problems can occur if you do not maintain proper fluid levels. Treatment may include:  ¨ Taking an oral rehydration solution (ORS).  ¨ Not drinking beverages that contain caffeine (such as tea, coffee, and soft drinks).  ¨ Not drinking alcohol.  ¨ Maintaining well-balanced nutrition to help you recover faster.  HOME CARE INSTRUCTIONS   · Drink enough fluids to keep urine clear or pale yellow. Drink 1 cup (8 oz) of fluid for each diarrhea episode. Avoid fluids that contain simple sugars, fruit juices, whole milk products, and sodas. Hydrate with an ORS. You may purchase the ORS or prepare it at home by mixing the   following ingredients together:  ¨  - tsp (1.7-3  mL) table salt.  ¨ ¾ tsp (3 ¾ mL) baking soda.  ¨  tsp (1.7 mL) salt substitute containing potassium chloride.  ¨ 1 tbsp (20 mL) sugar.  ¨ 4.2 c (1 L) of water.    · Certain foods and beverages may increase the speed at which food moves through the gastrointestinal (GI) tract. These foods and beverages should be avoided. They include:  ¨ Caffeinated and alcoholic beverages.  ¨ High-fiber foods, such as raw fruits and vegetables, nuts, seeds, and whole grain breads and cereals.  ¨ Foods and beverages sweetened with sugar  alcohols, such as xylitol, sorbitol, and mannitol.    · Some foods may be well tolerated and may help thicken stool. These include:  ¨ Starchy foods, such as rice, toast, pasta, low-sugar cereal, oatmeal, grits, baked potatoes, crackers, and bagels.  ¨ Bananas.  ¨ Applesauce.  · Add probiotic-rich foods to help increase healthy bacteria in the GI tract. These include yogurt and fermented milk products.  · Wash your hands well after each diarrhea episode.  · Only take over-the-counter or prescription medicines as directed by your health care provider.  · Take a warm bath to relieve any burning or pain from frequent diarrhea episodes.  SEEK MEDICAL CARE IF:   · You are not urinating as often.  · Your urine is a dark color.  · You become very tired or dizzy.  · You have severe pain in the abdomen or rectum.  · Your have blood or pus in your stools.  · Your stools look black and tarry.  SEEK IMMEDIATE MEDICAL CARE IF:   · You are unable to keep fluids down.  · You have persistent vomiting.  · You have blood in your stool.  · Your stools are black and tarry.  · You do not urinate in 6-8 hours, or there is only a small amount of very dark urine.  · You have abdominal pain that increases or localizes.  · You have weakness, dizziness, confusion, or lightheadedness.  · You have a severe headache.  · Your diarrhea gets worse or does not get better.  · You have a fever or persistent symptoms for more than 2-3 days.  · You have a fever and your symptoms suddenly get worse.  MAKE SURE YOU:   · Understand these instructions.  · Will watch your condition.  · Will get help right away if you are not doing well or get worse.  Document Released: 08/20/2003 Document Revised: 06/04/2013 Document Reviewed: 11/22/2012  [employer]® Patient Information ©2015 [employer]. This information is not intended to replace advice given to you by your health care provider. Make sure you discuss any questions you have with your health care  provider.

## 2014-06-23 NOTE — ED Provider Notes (Signed)
CSN: [id number]     Arrival date & time 06/23/14  0102 History   First MD Initiated Contact with Patient 06/23/14 [phone]     Chief Complaint  Patient presents with  . Abdominal Pain  . Diarrhea      HPI Patient presents with bouts of explosive diarrhea.  This is a problem that's been going on for several years but seems to be gotten worse over the last week or 2.  Patient felt bad this morning.  Patient has an appointment see his gastroenterologist on Thursday.  He thought he might be a little dehydrated.  Patient denies any other significant abdominal complaints. Past Medical History  Diagnosis Date  . Neuropathy   . Hyperlipidemia   . Arthritis     arthritis,osteopenia,"spinal stenosis"  . GERD (gastroesophageal reflux disease)   . H/O hiatal hernia   . H/O vertigo 12-06-12    none recent  . Anemia   . Depression   . Cancer 12-06-12    Prostate cancer'98  . Peripheral neuropathy 12-06-12    peripheral neuropathy   Past Surgical History  Procedure Laterality Date  . Appendectomy    . Cholecystectomy    . Carpal tunnel release      both hands  . Prostate surgery  12-06-12  . Cataract surgery Left 12-06-12    recent surgery  . Esophagogastroduodenoscopy (egd) with propofol N/A 12/25/2012    Procedure: ESOPHAGOGASTRODUODENOSCOPY (EGD) WITH PROPOFOL;  Surgeon: Garlan Fair, MD;  Location: [facility];  Service: Endoscopy;  Laterality: N/A;  . Colonoscopy with propofol N/A 12/25/2012    Procedure: COLONOSCOPY WITH PROPOFOL;  Surgeon: Garlan Fair, MD;  Location: [facility];  Service: Endoscopy;  Laterality: N/A;   Family History  Problem Relation Age of Onset  . CAD Father     Deceased, 11  . Dementia Mother     Deceased, 46   History  Substance Use Topics  . Smoking status: Former Smoker -- 0.50 packs/day for 30 years    Quit date: 06/14/1963  . Smokeless tobacco: Never Used  . Alcohol Use: Yes     Comment: 1-2 drinks nightly    Review of Systems   Constitutional: Negative for fever, chills and unexpected weight change.  Gastrointestinal: Negative for vomiting, abdominal pain, diarrhea, blood in stool and abdominal distention.  All other systems reviewed and are negative.     Allergies  Penicillins  Home Medications   Prior to Admission medications   Medication Sig Start Date End Date Taking? Authorizing Provider  Calcium Carbonate-Vitamin D (CALTRATE 600+D PO) Take 1 tablet by mouth 2 (two) times daily.   Yes Historical Provider, MD  cyanocobalamin (,VITAMIN B-12,) 1000 MCG/ML injection Inject 1,000 mcg into the muscle every 30 (thirty) days. Last dose was 06-11-14   Yes Historical Provider, MD  diphenoxylate-atropine (LOMOTIL) 2.5-0.025 MG per tablet Take 1 tablet by mouth 4 (four) times daily as needed for diarrhea or loose stools.   Yes Historical Provider, MD  donepezil (ARICEPT) 5 MG tablet Take one tablet at bedtime x one month, then take two tablets at bedtime. Patient taking differently: Take 10 mg by mouth at bedtime.  04/29/14  Yes Donika Keith Rake, DO  FLUZONE HIGH-DOSE 0.5 ML SUSY  03/20/14  Yes Historical Provider, MD  gabapentin (NEURONTIN) 300 MG capsule Take 300 mg by mouth 3 (three) times daily.    Yes Historical Provider, MD  propranolol (INDERAL) 20 MG tablet Take 1 tablet (20 mg total) by mouth 2 (  two) times daily. 04/29/14  Yes Donika K Patel, DO  simvastatin (ZOCOR) 20 MG tablet Take 20 mg by mouth every evening.   Yes Historical Provider, MD  alendronate (FOSAMAX) 70 MG tablet Take 70 mg by mouth every Saturday. Take with a full glass of water on an empty stomach.    Historical Provider, MD  Cholecalciferol (VITAMIN D) 2000 UNITS tablet Take 2,000 Units by mouth daily.    Historical Provider, MD  Cyanocobalamin (VITAMIN B-12 IJ) Inject as directed.    Historical Provider, MD  dicyclomine (BENTYL) 20 MG tablet Take 1 tablet (20 mg total) by mouth 3 (three) times daily before meals. 06/23/14   Dot Lanes, MD    BP 144/68 mmHg  Pulse 85  Temp(Src) 97.9 F (36.6 C) (Oral)  Resp 18  SpO2 98% Physical Exam Physical Exam  Nursing note and vitals reviewed. Constitutional: He is oriented to person, place, and time. He appears well-developed and well-nourished. No distress.  HENT:  Head: Normocephalic and atraumatic.  Eyes: Pupils are equal, round, and reactive to light.  Neck: Normal range of motion.  Cardiovascular: Normal rate and intact distal pulses.   Pulmonary/Chest: No respiratory distress.  Abdominal: Normal appearance. He exhibits no distension.  hyperactive bowel sounds.  Soft to palpation with no rebound or guarding tenderness.  No signs of peritoneal irritation. Musculoskeletal: Normal range of motion.  Neurological: He is alert and oriented to person, place, and time. No cranial nerve deficit.  Skin: Skin is warm and dry. No rash noted.  Psychiatric: He has a normal mood and affect. His behavior is normal.   ED Course  Procedures (including critical care time) Labs Review Labs Reviewed  COMPREHENSIVE METABOLIC PANEL - Abnormal; Notable for the following:    Glucose, Bld 105 (*)    GFR calc non Af Amer 53 (*)    GFR calc Af Amer 61 (*)    All other components within normal limits  CBC WITH DIFFERENTIAL - Abnormal; Notable for the following:    RBC 4.15 (*)    HCT 38.4 (*)    Platelets 112 (*)    All other components within normal limits    After treatment in the ED the patient feels back to baseline and wants to go home.   MDM   Final diagnoses:  Chronic diarrhea        Dot Lanes, MD 06/23/14 1055

## 2014-06-23 NOTE — ED Notes (Signed)
Pt here for diarrhea x months. sts ongoing for 2 years. sts he feels dehydrated.

## 2014-06-24 DIAGNOSIS — M47816 Spondylosis without myelopathy or radiculopathy, lumbar region: Secondary | ICD-10-CM | POA: Diagnosis not present

## 2014-06-26 DIAGNOSIS — R197 Diarrhea, unspecified: Secondary | ICD-10-CM | POA: Diagnosis not present

## 2014-06-27 ENCOUNTER — Ambulatory Visit (INDEPENDENT_AMBULATORY_CARE_PROVIDER_SITE_OTHER): Payer: Medicare Other | Admitting: Internal Medicine

## 2014-06-27 ENCOUNTER — Encounter: Payer: Self-pay | Admitting: Internal Medicine

## 2014-06-27 VITALS — BP 136/70 | HR 64 | Ht 71.0 in | Wt 184.1 lb

## 2014-06-27 DIAGNOSIS — R079 Chest pain, unspecified: Secondary | ICD-10-CM | POA: Diagnosis not present

## 2014-06-27 DIAGNOSIS — N393 Stress incontinence (female) (male): Secondary | ICD-10-CM | POA: Diagnosis not present

## 2014-06-27 DIAGNOSIS — Z8546 Personal history of malignant neoplasm of prostate: Secondary | ICD-10-CM | POA: Diagnosis not present

## 2014-06-27 DIAGNOSIS — N320 Bladder-neck obstruction: Secondary | ICD-10-CM | POA: Diagnosis not present

## 2014-06-27 NOTE — Progress Notes (Signed)
Cardiology Office Note   Date:  06/27/2014   ID:  Ryan Humphrey, DOB 02/26/1929, MRN [id number]  PCP:  Dorian Heckle, MD  Cardiologist:   Dorris Carnes, MD   Chief Complaint  Patient presents with  . OTHER    NP      History of Present Illness: Ryan Humphrey is a 81 y.o. male who presents for ev The patient has  a history of mild CAD by cath and dyslipidemia. I last saw him in clinic in 2011 He was seen in ER on 12/8 for CP  Not felt tom be cardiac.   Since thin he has had occasional episdos of chest pressure.  Breatig at times shallow  Denies PND  No wheezing. No palpitations Is having significant problems with IBS.      Past Medical History  Diagnosis Date  . Neuropathy   . Hyperlipidemia   . Arthritis     arthritis,osteopenia,"spinal stenosis"  . GERD (gastroesophageal reflux disease)   . H/O hiatal hernia   . H/O vertigo 12-06-12    none recent  . Anemia   . Depression   . Cancer 12-06-12    Prostate cancer'98  . Peripheral neuropathy 12-06-12    peripheral neuropathy    Past Surgical History  Procedure Laterality Date  . Appendectomy    . Cholecystectomy    . Carpal tunnel release      both hands  . Prostate surgery  12-06-12  . Cataract surgery Left 12-06-12    recent surgery  . Esophagogastroduodenoscopy (egd) with propofol N/A 12/25/2012    Procedure: ESOPHAGOGASTRODUODENOSCOPY (EGD) WITH PROPOFOL;  Surgeon: Garlan Fair, MD;  Location: [facility];  Service: Endoscopy;  Laterality: N/A;  . Colonoscopy with propofol N/A 12/25/2012    Procedure: COLONOSCOPY WITH PROPOFOL;  Surgeon: Garlan Fair, MD;  Location: [facility];  Service: Endoscopy;  Laterality: N/A;     Current Outpatient Prescriptions  Medication Sig Dispense Refill  . alendronate (FOSAMAX) 70 MG tablet Take 70 mg by mouth every Saturday. Take with a full glass of water on an empty stomach.    . Calcium Carbonate-Vitamin D (CALTRATE 600+D PO) Take 1 tablet by mouth 2 (two)  times daily.    . Cholecalciferol (VITAMIN D) 2000 UNITS tablet Take 2,000 Units by mouth daily.    . cyanocobalamin (,VITAMIN B-12,) 1000 MCG/ML injection Inject 1,000 mcg into the muscle every 30 (thirty) days. Last dose was 06-11-14    . Cyanocobalamin (VITAMIN B-12 IJ) Inject as directed.    . dicyclomine (BENTYL) 20 MG tablet Take 1 tablet (20 mg total) by mouth 3 (three) times daily before meals. 20 tablet 0  . diphenoxylate-atropine (LOMOTIL) 2.5-0.025 MG per tablet Take 1 tablet by mouth 4 (four) times daily as needed for diarrhea or loose stools.    . donepezil (ARICEPT) 5 MG tablet Take one tablet at bedtime x one month, then take two tablets at bedtime. (Patient taking differently: Take 10 mg by mouth at bedtime. ) 60 tablet 3  . FLUZONE HIGH-DOSE 0.5 ML SUSY   0  . gabapentin (NEURONTIN) 300 MG capsule Take 300 mg by mouth 3 (three) times daily.     . propranolol (INDERAL) 20 MG tablet Take 1 tablet (20 mg total) by mouth 2 (two) times daily. 60 tablet 3  . simvastatin (ZOCOR) 20 MG tablet Take 20 mg by mouth every evening.     No current facility-administered medications for this visit.    Allergies:  Penicillins    Social History:  The patient  reports that he quit smoking about 51 years ago. He has never used smokeless tobacco. He reports that he drinks alcohol. He reports that he does not use illicit drugs.   Family History:  The patient's family history includes CAD in his father; Dementia in his mother.    ROS: .   All other systems are reviewed, neg to the above problem except as noted above     PHYSICAL EXAM: VS:  BP 136/70 mmHg  Pulse 64  Ht 5\' 11"  (1.803 m)  Wt 184 lb 1.9 oz (83.516 kg)  BMI 25.69 kg/m2 , BMI Body mass index is 25.69 kg/(m^2). GEN: Well nourished, well developed, in no acute distress HEENT: normal Neck: no JVD, carotid bruits, or masses Cardiac: RRR; no murmurs, rubs, or gallops,no edema  Respiratory:  clear to auscultation bilaterally,  normal work of breathing GI: soft, nontender, nondistended, + BS MS: no deformity or atrophy Skin: warm and dry, no rash Neuro:  Strength and sensation are intact Psych: euthymic mood, full affect   EKG: 05/20/14  SR  RBBB  Recent Labs: 02/25/2014: TSH 2.638 06/23/2014: ALT 18; BUN 13; Creatinine 1.22; Hemoglobin 13.3; Platelets 112*; Potassium 3.6; Sodium 140    Lipid Panel    Component Value Date/Time   CHOL 129 05/01/2009 0851   TRIG 104.0 05/01/2009 0851   HDL 31.20* 05/01/2009 0851   CHOLHDL 4 05/01/2009 0851   VLDL 20.8 05/01/2009 0851   LDLCALC 77 05/01/2009 0851      Wt Readings from Last 3 Encounters:  06/27/14 184 lb 1.9 oz (83.516 kg)  04/29/14 183 lb 4 oz (83.122 kg)  02/25/14 179 lb 6 oz (81.364 kg)     ASSESSMENT AND PLAN:  1. CP  Concerning esp with SOB  May be related to IBS but I would recomm a lexiscan myoview to r/o inducible ischemia.  2x  CAD  As above  3.  HL  Check lipids   F/U next winter        Signed, Dorris Carnes, MD  06/27/2014 10:34 AM    [facility] [place], [place], [place]  [address] Phone: [phone]; Fax: [phone]

## 2014-06-27 NOTE — Patient Instructions (Signed)
**Note De-Identified  Obfuscation** Your physician recommends that you continue on your current medications as directed. Please refer to the Current Medication list given to you today.  Your physician has requested that you have a lexiscan myoview. For further information please visit [email or web address]. Please follow instruction sheet, as given.  Your physician wants you to follow-up in: 10 months. You will receive a reminder letter in the mail two months in advance. If you don't receive a letter, please call our office to schedule the follow-up appointment.

## 2014-06-30 DIAGNOSIS — R197 Diarrhea, unspecified: Secondary | ICD-10-CM | POA: Diagnosis not present

## 2014-06-30 DIAGNOSIS — M1711 Unilateral primary osteoarthritis, right knee: Secondary | ICD-10-CM | POA: Diagnosis not present

## 2014-07-02 ENCOUNTER — Ambulatory Visit (HOSPITAL_COMMUNITY): Payer: Medicare Other | Attending: Cardiovascular Disease | Admitting: Radiology

## 2014-07-02 DIAGNOSIS — R0602 Shortness of breath: Secondary | ICD-10-CM | POA: Insufficient documentation

## 2014-07-02 DIAGNOSIS — R079 Chest pain, unspecified: Secondary | ICD-10-CM

## 2014-07-02 DIAGNOSIS — I2510 Atherosclerotic heart disease of native coronary artery without angina pectoris: Secondary | ICD-10-CM | POA: Insufficient documentation

## 2014-07-02 MED ORDER — TECHNETIUM TC 99M SESTAMIBI GENERIC - CARDIOLITE
10.0000 | Freq: Once | INTRAVENOUS | Status: AC | PRN
  Administered 2014-07-02: 10 via INTRAVENOUS

## 2014-07-02 MED ORDER — REGADENOSON 0.4 MG/5ML IV SOLN
0.4000 mg | Freq: Once | INTRAVENOUS | Status: AC
  Administered 2014-07-02: 0.4 mg via INTRAVENOUS

## 2014-07-02 MED ORDER — TECHNETIUM TC 99M SESTAMIBI GENERIC - CARDIOLITE
30.0000 | Freq: Once | INTRAVENOUS | Status: AC | PRN
  Administered 2014-07-02: 30 via INTRAVENOUS

## 2014-07-02 NOTE — Progress Notes (Signed)
[facility] SITE 3 NUCLEAR MED [address] [place], [place] [address] [phone]    Cardiology Nuclear Med Study  Ryan Humphrey is a 81 y.o. male     MRN : [id number]     DOB: 1928-12-30  Procedure Date: 07/02/2014  Nuclear Med Background Indication for Stress Test:  Evaluation for Ischemia, Follow up CAD History:  CAD, n/a Cardiac Risk Factors: N/A  Symptoms:  Chest Pain and SOB   Nuclear Pre-Procedure Caffeine/Decaff Intake:  None NPO After: 8:00pm   Lungs:  clear O2 Sat: 96% on room air. IV 0.9% NS with Angio Cath:  22g  IV Site: R Hand  IV Started by:  Crissie Figures, RN  Chest Size (in):  46 Cup Size: n/a  Height: 5\' 11"  (1.803 m)  Weight:  182 lb (82.555 kg)  BMI:  Body mass index is 25.4 kg/(m^2). Tech Comments:  N/A    Nuclear Med Study 1 or 2 day study: 1 day  Stress Test Type:  Lexiscan  Reading MD: N/A  Order Authorizing Provider:  Dorris Carnes, MD  Resting Radionuclide: Technetium 66m Sestamibi  Resting Radionuclide Dose: 11.0 mCi   Stress Radionuclide:  Technetium 49m Sestamibi  Stress Radionuclide Dose: 33.0 mCi           Stress Protocol Rest HR: 61 Stress HR: 80  Rest BP: 114/66 Stress BP: 119/65  Exercise Time (min): n/a METS: n/a   Predicted Max HR: 137 bpm % Max HR: 58.39 bpm Rate Pressure Product: 9520   Dose of Adenosine (mg):  n/a Dose of Lexiscan: 0.4 mg  Dose of Atropine (mg): n/a Dose of Dobutamine: n/a mcg/kg/min (at max HR)  Stress Test Technologist: Perrin Maltese, EMT-P  Nuclear Technologist:  Earl Many, CNMT     Rest Procedure:  Myocardial perfusion imaging was performed at rest 45 minutes following the intravenous administration of Technetium 54m Sestamibi. Rest ECG: NSR-RBBB  Stress Procedure:  The patient received IV Lexiscan 0.4 mg over 15-seconds.  Technetium 82m Sestamibi injected at 30-seconds. This patient had sob and was dizzy with the Lexiscan injection. Quantitative spect images were obtained after a 45  minute delay. Stress ECG: No significant change from baseline ECG  QPS Raw Data Images:  Normal; no motion artifact; normal heart/lung ratio. Stress Images:  medium-sized, mild basal to mid inferior perfusion defect. Rest Images:  Small, mild basal inferior perfusion defect. Subtraction (SDS):  Partially reversible medium-sized, mild basal to mid inferior perfusion defect. Transient Ischemic Dilatation (Normal <1.22):  1.00 Lung/Heart Ratio (Normal <0.45):  0.34  Quantitative Gated Spect Images QGS EDV:  81 ml QGS ESV:  31 ml  Impression Exercise Capacity:  Lexiscan with no exercise. BP Response:  Normal blood pressure response. Clinical Symptoms:  Dyspnea, dizziness ECG Impression:  No significant ST segment change suggestive of ischemia. Comparison with Prior Nuclear Study: No images to compare  Overall Impression:  Low risk stress nuclear study with a partially reversible, medium-sized but mild basal to mid inferior perfusion defect.  This may be diaphragmatic attenuation but cannot rule out ischemia given some reversibility. .  LV Ejection Fraction: 62%.  LV Wall Motion:  NL LV Function; NL Wall Motion   [name] 07/02/2014

## 2014-07-07 DIAGNOSIS — M1711 Unilateral primary osteoarthritis, right knee: Secondary | ICD-10-CM | POA: Diagnosis not present

## 2014-07-14 DIAGNOSIS — D510 Vitamin B12 deficiency anemia due to intrinsic factor deficiency: Secondary | ICD-10-CM | POA: Diagnosis not present

## 2014-07-14 DIAGNOSIS — M1711 Unilateral primary osteoarthritis, right knee: Secondary | ICD-10-CM | POA: Diagnosis not present

## 2014-07-16 ENCOUNTER — Telehealth: Payer: Self-pay | Admitting: Gastroenterology

## 2014-07-21 ENCOUNTER — Telehealth: Payer: Self-pay | Admitting: Gastroenterology

## 2014-07-21 DIAGNOSIS — M47816 Spondylosis without myelopathy or radiculopathy, lumbar region: Secondary | ICD-10-CM | POA: Diagnosis not present

## 2014-07-21 DIAGNOSIS — M1711 Unilateral primary osteoarthritis, right knee: Secondary | ICD-10-CM | POA: Diagnosis not present

## 2014-07-21 NOTE — Telephone Encounter (Signed)
Rec'd from [place] @ [facility] forward 88 pages to [place]

## 2014-07-29 ENCOUNTER — Ambulatory Visit (INDEPENDENT_AMBULATORY_CARE_PROVIDER_SITE_OTHER): Payer: Medicare Other | Admitting: Neurology

## 2014-07-29 ENCOUNTER — Encounter: Payer: Self-pay | Admitting: Neurology

## 2014-07-29 VITALS — BP 120/68 | HR 73 | Ht 71.0 in | Wt 182.4 lb

## 2014-07-29 DIAGNOSIS — G609 Hereditary and idiopathic neuropathy, unspecified: Secondary | ICD-10-CM

## 2014-07-29 DIAGNOSIS — M4806 Spinal stenosis, lumbar region: Secondary | ICD-10-CM | POA: Diagnosis not present

## 2014-07-29 DIAGNOSIS — G3184 Mild cognitive impairment, so stated: Secondary | ICD-10-CM | POA: Diagnosis not present

## 2014-07-29 DIAGNOSIS — M48061 Spinal stenosis, lumbar region without neurogenic claudication: Secondary | ICD-10-CM

## 2014-07-29 DIAGNOSIS — G250 Essential tremor: Secondary | ICD-10-CM

## 2014-07-29 NOTE — Progress Notes (Signed)
Follow-up Visit   Date: 07/29/2014  Ryan Humphrey MRN: [id number] DOB: [birth year]   Interim History: Ryan Humphrey is a 81 y.o. right-handed Caucasian male with hyperlipidemia, arthritis, GERD, depression, prostate cancer, vitamin B12 deficiency, lumbar spine stenosis, and coronary artery disease returning to the clinic for follow-up of gait imbalance, new memory changes, and tremors.    History of present illness: The patient had been seeing Dr. Erling Cruz since 2013 for right-sided nonradiating neck pain. He had previous injections at [facility] pain clinic and by Dr. Mina Marble with no benefit. Most improvement was seen with diclofenac 75 mg twice daily, however due to side effects it was reduced to 50 mg twice daily. His neck pain slowly improved. There was also note of low back pain with radiation to the left lower extremity with associated numbness of the feet. At some point she was diagnosed with impaired fasting glucose. He was started gabapentin 300mg  TID and completed PT. He no longer has any pain, but continues to have problems with balance. His care was then transitioned to Dr. Lonni Fix and patient was last seen in May 2015 for bilateral leg cramps and burning paresthesias. He was taking gabapentin 300 mg 3 times daily. At that time, MRI studies as well as additional testing was discussed, however patient declined.  Over the past several months, he has noticed increased problems with balance and finds himself using the furniture for assistance, especially at night time. He also feels as if his legs are weak. He has mild tingling of the toes at night. He has not had any falls, but does endorse stumbling at times. He walks independently. No similar symptoms of the hands.  UPDATE 04/29/2014:  He completed therapy and report no significant change, but was taught how to use the cane better.  He had on night of severe burning pain, but otherwise symptoms are relatively stable.  Denies any  falls.  He reports to having short-term memory problems and has to try harder to recall things, especially peoples names.  He also reports to have illegible hand writing and a tremor when he is trying to do fine motor tasks.  He is still living alone and taking care of his finances as well as driving without any difficulty.  UPDATE 07/29/2014:  He feels that his balance is stable without any worsening. His tremors have improved since start propranolol 20mg  daily. He has noticed vivid dreams since start Aricept, but they are not bothersome to stop it.  He continues to use his cane and has not had any falls. No new problems with driving or managing finances. He does not cook and eats most of his meals out.  He lives alone and his daughter visits daily.     Medications:  Current Outpatient Prescriptions on File Prior to Visit  Medication Sig Dispense Refill  . alendronate (FOSAMAX) 70 MG tablet Take 70 mg by mouth every Saturday. Take with a full glass of water on an empty stomach.    . Calcium Carbonate-Vitamin D (CALTRATE 600+D PO) Take 1 tablet by mouth 2 (two) times daily.    . Cholecalciferol (VITAMIN D) 2000 UNITS tablet Take 2,000 Units by mouth daily.    . cyanocobalamin (,VITAMIN B-12,) 1000 MCG/ML injection Inject 1,000 mcg into the muscle every 30 (thirty) days. Last dose was 06-11-14    . dicyclomine (BENTYL) 20 MG tablet Take 1 tablet (20 mg total) by mouth 3 (three) times daily before meals. 20 tablet 0  .  diphenoxylate-atropine (LOMOTIL) 2.5-0.025 MG per tablet Take 1 tablet by mouth 4 (four) times daily as needed for diarrhea or loose stools.    . donepezil (ARICEPT) 5 MG tablet Take one tablet at bedtime x one month, then take two tablets at bedtime. (Patient taking differently: Take 10 mg by mouth at bedtime. ) 60 tablet 3  . FLUZONE HIGH-DOSE 0.5 ML SUSY   0  . gabapentin (NEURONTIN) 300 MG capsule Take 300 mg by mouth. 1 tab in the morning and afternoon and 2 tablets at bedtime.    .  propranolol (INDERAL) 20 MG tablet Take 1 tablet (20 mg total) by mouth 2 (two) times daily. 60 tablet 3  . simvastatin (ZOCOR) 20 MG tablet Take 20 mg by mouth every evening.     No current facility-administered medications on file prior to visit.    Allergies:  Allergies  Allergen Reactions  . Penicillins Other (See Comments)    "rash"     Review of Systems:  CONSTITUTIONAL: No fevers, chills, night sweats, or weight loss.   EYES: No visual changes or eye pain ENT: No hearing changes.  No history of nose bleeds.   RESPIRATORY: No cough, wheezing and shortness of breath.   CARDIOVASCULAR: Negative for chest pain, and palpitations.   GI: Negative for abdominal discomfort, blood in stools or black stools.  No recent change in bowel habits.   GU:  No history of incontinence.   MUSCLOSKELETAL: No history of joint pain or swelling.  No myalgias.   SKIN: Negative for lesions, rash, and itching.   ENDOCRINE: Negative for cold or heat intolerance, polydipsia or goiter.   PSYCH:  No depression or anxiety symptoms.   NEURO: As Above.   Vital Signs:  BP 120/68 mmHg  Pulse 73  Ht 5\' 11"  (1.803 m)  Wt 182 lb 6 oz (82.725 kg)  BMI 25.45 kg/m2  SpO2 97%  Neurological Exam: MENTAL STATUS including orientation to time, place, person, recent and remote memory, attention span and concentration, language, and fund of knowledge is relatively intact.  Speech is not dysarthric.  Montreal Cognitive Assessment  04/29/2014  Visuospatial/ Executive (0/5) 5  Naming (0/3) 2  Attention: Read list of digits (0/2) 2  Attention: Read list of letters (0/1) 1  Attention: Serial 7 subtraction starting at 100 (0/3) 3  Language: Repeat phrase (0/2) 2  Language : Fluency (0/1) 1  Abstraction (0/2) 2  Delayed Recall (0/5) 3  Orientation (0/6) 6  Total 27  Adjusted Score (based on education) 27    CRANIAL NERVES:  Pupils equal round and reactive to light.  Normal conjugate, extra-ocular eye movements  in all directions of gaze.  No ptosis.  Face is symmetric. Palate elevates symmetrically.  Tongue is midline.  MOTOR:  Motor strength is 5/5 in all extremities. Tone is normal. Subtle postural right hand tremor, more noticeable with finger to nose testing  SENSORY:  Reduced vibration distal to ankles bilaterally.  COORDINATION/GAIT:   Intact rapid alternating movements bilaterally.  Gait is assisted and appears stable  Data: Labs 10/04/2012: Magnesium 2.5 sodium 142, potassium 4.8, creatinine 1.1 Labs 04/29/2014:  Vitamin B12 473, TSH 2.6  04/13/07 MRI cervical spine - congenital fusion at C6-7 with residual uncovertebral disease on the left causing mild to moderate left foraminal stenosis, advanced spondylosis at C3-C4 through C5-C6 with mild foraminal narrowing at each of those levels worse on the right with a disc contacting and deforming the cord at each of those levels.  10/28/10 MRI lumbar spine - mild to moderate spinal stenosis at L1-2, moderate to marked left-sided and moderate right-sided spinal stenosis at L2-3, moderate to marked spinal stenosis and bilateral lateral recess stenosis at L3-4, mild spinal stenosis greater on the right and mild left foraminal narrowing at L4-5, broad-based disc osteophyte ridge and left mild indentation of the thecal sac at L5-S1 with mild extension to the left but no compression of the L5 nerve root.  IMPRESSION/PLAN: 1.  Mild cognitive impairment, stable.  Continue Aricept 10mg  daily  2.  Benign essential tremors, stable Continue propranolol 20mg  BID  3.  Multifactorial gait instability from idiopathic peripheral neuropathy and known cervical and lumbar stenosis with myelopathy Clinically stable Increase to 300-300-600mg  for paresthesias  4.  Home safety issues discussed Information provided on [place] [employer] parking placard given  4.  Return to clinic in 1 year or sooner as needed   The duration of this appointment visit was  25 minutes of face-to-face time with the patient.  Greater than 50% of this time was spent in counseling, explanation of diagnosis, planning of further management, and coordination of care.   Thank you for allowing me to participate in patient's care.  If I can answer any additional questions, I would be pleased to do so.    Sincerely,    Zephaniah Enyeart K. Posey Pronto, DO

## 2014-07-29 NOTE — Patient Instructions (Addendum)
1.  Handicap placard given 2.  Increase gabapentin to 300mg  in the morning and afternoon and 600mg  (2 pills) at bedtime. 3.  Return to clinic in 1 year   [place] that can locate patients outside the home:  http://mobilealertsystems.com/ 631-703-2065 http://www.lifelinesys.com/content/lifeline-products/get-life-gosafe  952-162-5041 www.verizonwireless.com/sure/ (515) 543-2181  Medial Alert systems that link to smart phones:  http://www.lifelinesys.com/content/lifeline-products/response-app https://www.stanton.info/ RecycleRoad.pl.aspx  [employer] GPS Tracker:  VoiceZap.is 8101799979

## 2014-07-30 DIAGNOSIS — M1711 Unilateral primary osteoarthritis, right knee: Secondary | ICD-10-CM | POA: Diagnosis not present

## 2014-08-13 DIAGNOSIS — D510 Vitamin B12 deficiency anemia due to intrinsic factor deficiency: Secondary | ICD-10-CM | POA: Diagnosis not present

## 2014-08-29 NOTE — Telephone Encounter (Signed)
appt set

## 2014-09-01 DIAGNOSIS — M19041 Primary osteoarthritis, right hand: Secondary | ICD-10-CM | POA: Diagnosis not present

## 2014-09-01 DIAGNOSIS — M1711 Unilateral primary osteoarthritis, right knee: Secondary | ICD-10-CM | POA: Diagnosis not present

## 2014-09-03 ENCOUNTER — Encounter: Payer: Self-pay | Admitting: Gastroenterology

## 2014-09-03 ENCOUNTER — Ambulatory Visit (INDEPENDENT_AMBULATORY_CARE_PROVIDER_SITE_OTHER): Payer: Medicare Other | Admitting: Gastroenterology

## 2014-09-03 ENCOUNTER — Other Ambulatory Visit (INDEPENDENT_AMBULATORY_CARE_PROVIDER_SITE_OTHER): Payer: Medicare Other

## 2014-09-03 ENCOUNTER — Ambulatory Visit: Payer: Medicare Other | Admitting: Gastroenterology

## 2014-09-03 VITALS — BP 124/62 | HR 88 | Ht 71.0 in | Wt 185.1 lb

## 2014-09-03 DIAGNOSIS — R152 Fecal urgency: Secondary | ICD-10-CM | POA: Diagnosis not present

## 2014-09-03 DIAGNOSIS — R197 Diarrhea, unspecified: Secondary | ICD-10-CM | POA: Diagnosis not present

## 2014-09-03 LAB — SEDIMENTATION RATE: SED RATE: 21 mm/h (ref 0–22)

## 2014-09-03 LAB — IGA: IgA: 147 mg/dL (ref 68–378)

## 2014-09-03 LAB — TSH: TSH: 0.94 u[IU]/mL (ref 0.35–4.50)

## 2014-09-03 LAB — C-REACTIVE PROTEIN: CRP: 0.1 mg/dL — ABNORMAL LOW (ref 0.5–20.0)

## 2014-09-03 NOTE — Progress Notes (Signed)
History of Present Illness: This is an 81 year old male referred by Dorian Heckle, MD for the evaluation of diarrhea. I reviewed extensive records that were over 1 inch thick of papers related to his GI and primary care follow-up and evaluations. The patient relates a 2 year history of frequent urgent watery nonbloody diarrhea. He describes it occasionally as explosive. Occasionally he has fecal incontinence. In between these episodes he has normal bowel movements. He has been followed by Dr. Howell Rucks and has undergone an extensive GI evaluation for his diarrhea without a clear etiology uncovered. His working diagnosis is IBS-D. He underwent upper endoscopy in July 2014 that was normal. Colonoscopy in July 2014 was also normal with normal random biopsies. Stool cultures, lactoferrin and, fecal fat studies performed in January 2016 were negative or normal. He has tried a gluten-free diet without change in symptoms. Zoloft was temporarily discontinued and his diarrhea did not improve his dose of gabapentin was lowered and his diarrhea did not improve. At this point the patient feels quite limited unable to spend long periods of time outside the house for fear of urgency and incontinence. He only takes on a small amount of milk products but has not tried it total lactose-free diet. Denies weight loss, abdominal pain, constipation, diarrhea, change in stool caliber, melena, hematochezia, nausea, vomiting, dysphagia, reflux symptoms, chest pain.   Allergies  Allergen Reactions  . Penicillins Other (See Comments)    "rash"   Outpatient Prescriptions Prior to Visit  Medication Sig Dispense Refill  . alendronate (FOSAMAX) 70 MG tablet Take 70 mg by mouth every Saturday. Take with a full glass of water on an empty stomach.    . Calcium Carbonate-Vitamin D (CALTRATE 600+D PO) Take 1 tablet by mouth 2 (two) times daily.    . Cholecalciferol (VITAMIN D) 2000 UNITS tablet Take 2,000 Units by mouth  daily.    . cyanocobalamin (,VITAMIN B-12,) 1000 MCG/ML injection Inject 1,000 mcg into the muscle every 30 (thirty) days. Last dose was 06-11-14    . diphenoxylate-atropine (LOMOTIL) 2.5-0.025 MG per tablet Take 1 tablet by mouth 4 (four) times daily as needed for diarrhea or loose stools.    . donepezil (ARICEPT) 5 MG tablet Take one tablet at bedtime x one month, then take two tablets at bedtime. (Patient taking differently: Take 10 mg by mouth at bedtime. ) 60 tablet 3  . FLUZONE HIGH-DOSE 0.5 ML SUSY   0  . gabapentin (NEURONTIN) 300 MG capsule Take 300 mg by mouth 4 (four) times daily. 1 tab in the morning and afternoon and 2 tablets at bedtime.    . propranolol (INDERAL) 20 MG tablet Take 1 tablet (20 mg total) by mouth 2 (two) times daily. 60 tablet 3  . simvastatin (ZOCOR) 20 MG tablet Take 20 mg by mouth every evening.    . dicyclomine (BENTYL) 20 MG tablet Take 1 tablet (20 mg total) by mouth 3 (three) times daily before meals. 20 tablet 0   No facility-administered medications prior to visit.   Past Medical History  Diagnosis Date  . Neuropathy   . Hyperlipidemia   . Arthritis     arthritis,osteopenia,"spinal stenosis"  . GERD (gastroesophageal reflux disease)   . H/O hiatal hernia   . H/O vertigo 12-06-12    none recent  . Anemia   . Depression   . Cancer 12-06-12    Prostate cancer'98  . Peripheral neuropathy 12-06-12    peripheral neuropathy  . RLS (  restless legs syndrome)   . Rhinitis   . CAD (coronary artery disease)   . Osteopenia   . Vitamin B12 deficiency   . IBS (irritable bowel syndrome)    Past Surgical History  Procedure Laterality Date  . Appendectomy    . Cholecystectomy    . Carpal tunnel release      both hands  . Prostate surgery  12-06-12  . Cataract surgery Left 12-06-12    recent surgery  . Esophagogastroduodenoscopy (egd) with propofol N/A 12/25/2012    Procedure: ESOPHAGOGASTRODUODENOSCOPY (EGD) WITH PROPOFOL;  Surgeon: Garlan Fair, MD;   Location: [facility] ENDOSCOPY;  Service: Endoscopy;  Laterality: N/A;  . Colonoscopy with propofol N/A 12/25/2012    Procedure: COLONOSCOPY WITH PROPOFOL;  Surgeon: Garlan Fair, MD;  Location: [facility] ENDOSCOPY;  Service: Endoscopy;  Laterality: N/A;  . Tonsillectomy     History   Social History  . Marital Status: Widowed    Spouse Name: N/A  . Number of Children: 2  . Years of Education: College   Occupational History  . Retired    Social History Main Topics  . Smoking status: Former Smoker -- 0.50 packs/day for 30 years    Quit date: 06/14/1963  . Smokeless tobacco: Never Used  . Alcohol Use: No  . Drug Use: No  . Sexual Activity: Not Currently   Other Topics Concern  . None   Social History Narrative   Pt lives at home alone, widowed in 2011, married for 55 years. They have two grown daugthers.   Plays the piano.   Caffeine Use: Rarely   Family History  Problem Relation Age of Onset  . CAD Father     Deceased, 1  . Dementia Mother     Deceased, 90  . Colon cancer Neg Hx   . Colon polyps Neg Hx   . Kidney disease Neg Hx   . Diabetes Neg Hx   . Esophageal cancer Neg Hx   . Gallbladder disease Neg Hx       Review of Systems: Pertinent positive and negative review of systems were noted in the above HPI section. All other review of systems were otherwise negative.   Physical Exam: General: Well developed, well nourished, no acute distress Head: Normocephalic and atraumatic Eyes:  sclerae anicteric, EOMI Ears: Normal auditory acuity Mouth: No deformity or lesions Neck: Supple, no masses or thyromegaly Lungs: Clear throughout to auscultation Heart: Regular rate and rhythm; no murmurs, rubs or bruits Abdomen: Soft, non tender and non distended. No masses, hepatosplenomegaly or hernias noted. Normal Bowel sounds Musculoskeletal: Symmetrical with no gross deformities  Skin: No lesions on visible extremities Pulses:  Normal pulses noted Extremities: No clubbing,  cyanosis, edema or deformities noted Neurological: Alert oriented x 4, grossly nonfocal Cervical Nodes:  No significant cervical adenopathy Inguinal Nodes: No significant inguinal adenopathy Psychological:  Alert and cooperative. Normal mood and affect  Assessment and Recommendations:  1. Diarrhea, intermittent, urgent. Occasional fecal incontinence. Possible IBS-D. Rule out bacterial overgrowth, celiac disease, pancreatic insufficiency and other disorders. Stool for fecal elastase and several blood tests ordered. Complete lactose-free diet for 2 weeks. If his symptoms are not improved begin a FODMAP diet for 2 weeks. Consider a course of Xifaxan for IBS-D or SIBO if above dietary measures are not successful and no clear etiology is uncovered with above testing. Use Lomotil 1-2 every 4 hours as needed. Also use Lomotil 1-2 prophylactically when he needs to spend time outside the home when bathroom  access may be challenging. REV in 4-6 weeks. Over 60 minutes was spent with the patient reviewing records and coordinating care. Greater than 50% of time was spent counseling and coordinating care.   cc: Dorian Heckle, MD [address], [place] [id number]

## 2014-09-03 NOTE — Patient Instructions (Signed)
Your physician has requested that you go to the basement for lab work before leaving today. If negative results then will send in the medication Xifaxan to your pharmacy.   Stay on a Lactose free diet( No milk, milk products ex: ice cream, cheese) x 2 weeks. If no improvement in your symptoms then start Fodmap diet.   Your follow up appointment with Dr. Fuller Plan is on 10/10/14 at 9:15am.   Thank you for choosing me and [facility].  Pricilla Riffle. Dagoberto Ligas., MD., Marval Regal  cc: Dorian Heckle, MD

## 2014-09-04 ENCOUNTER — Other Ambulatory Visit: Payer: Medicare Other

## 2014-09-04 DIAGNOSIS — R197 Diarrhea, unspecified: Secondary | ICD-10-CM

## 2014-09-04 DIAGNOSIS — R152 Fecal urgency: Secondary | ICD-10-CM | POA: Diagnosis not present

## 2014-09-04 LAB — TISSUE TRANSGLUTAMINASE, IGA: TISSUE TRANSGLUTAMINASE AB, IGA: 1 U/mL (ref <4)

## 2014-09-05 LAB — GASTROINTESTINAL PATHOGEN PANEL PCR
C. DIFFICILE TOX A/B, PCR: NEGATIVE
CRYPTOSPORIDIUM, PCR: NEGATIVE
Campylobacter, PCR: NEGATIVE
E COLI (ETEC) LT/ST, PCR: NEGATIVE
E COLI (STEC) STX1/STX2, PCR: NEGATIVE
E COLI 0157, PCR: NEGATIVE
Giardia lamblia, PCR: NEGATIVE
Norovirus, PCR: NEGATIVE
Rotavirus A, PCR: NEGATIVE
SHIGELLA, PCR: NEGATIVE
Salmonella, PCR: NEGATIVE

## 2014-09-16 DIAGNOSIS — D510 Vitamin B12 deficiency anemia due to intrinsic factor deficiency: Secondary | ICD-10-CM | POA: Diagnosis not present

## 2014-09-16 LAB — PANCREATIC ELASTASE, FECAL: Pancreatic Elastase-1, Stool: >500 mcg/g

## 2014-10-10 ENCOUNTER — Encounter: Payer: Self-pay | Admitting: Gastroenterology

## 2014-10-10 ENCOUNTER — Ambulatory Visit (INDEPENDENT_AMBULATORY_CARE_PROVIDER_SITE_OTHER): Payer: Medicare Other | Admitting: Gastroenterology

## 2014-10-10 VITALS — BP 122/62 | HR 84 | Ht 71.0 in | Wt 178.1 lb

## 2014-10-10 DIAGNOSIS — K589 Irritable bowel syndrome without diarrhea: Secondary | ICD-10-CM

## 2014-10-10 DIAGNOSIS — E739 Lactose intolerance, unspecified: Secondary | ICD-10-CM

## 2014-10-10 NOTE — Patient Instructions (Signed)
Follow up with Dr. Fuller Plan as needed.  Thank you for choosing me and [facility].  Pricilla Riffle. Dagoberto Ligas., MD., Marval Regal  cc: Dorian Heckle, MD

## 2014-10-10 NOTE — Progress Notes (Signed)
    History of Present Illness: This is an 81 year old male with chronic diarrhea, presumed IBS-D. Celiac testing negative, TSH normal, GI pathogen panel negative, ESR and CRP negative. He did not try Xifaxan. His diarrhea has resolved on FODMAP and lactose free diets. He feels better than he has in 2 years. He frequently has formed stools. On one occasion when he was not able to closely follow the follow the FODMAP diet he did have diarrhea with incontinence.  Current Medications, Allergies, Past Medical History, Past Surgical History, Family History and Social History were reviewed in [employer] record.  Physical Exam: General: Well developed , well nourished, no acute distress Head: Normocephalic and atraumatic Eyes:  sclerae anicteric, EOMI Ears: Normal auditory acuity Mouth: No deformity or lesions Lungs: Clear throughout to auscultation Heart: Regular rate and rhythm; no murmurs, rubs or bruits Abdomen: Soft, non tender and non distended. No masses, hepatosplenomegaly or hernias noted. Normal Bowel sounds Musculoskeletal: Symmetrical with no gross deformities  Pulses:  Normal pulses noted Extremities: No clubbing, cyanosis, edema or deformities noted Neurological: Alert oriented x 4, grossly nonfocal Psychological:  Alert and cooperative. Normal mood and affect  Assessment and Recommendations:  1. IBS-D. Conrtinue lactose free and FODMAP diets long term. Lomotil or Imodium as needed. Follow up with PCP. Follow up with GI as needed.

## 2014-10-20 DIAGNOSIS — D510 Vitamin B12 deficiency anemia due to intrinsic factor deficiency: Secondary | ICD-10-CM | POA: Diagnosis not present

## 2014-11-07 ENCOUNTER — Other Ambulatory Visit: Payer: Self-pay | Admitting: Orthopaedic Surgery

## 2014-11-12 ENCOUNTER — Other Ambulatory Visit (HOSPITAL_COMMUNITY): Payer: Self-pay | Admitting: *Deleted

## 2014-11-12 NOTE — Pre-Procedure Instructions (Addendum)
    Ryan Humphrey  11/12/2014      Your procedure is scheduled on Tuesday, November 25, 2014 at 7:30 AM.   Report to [facility] Entrance "A" Admitting Office at 5:30 AM.   Call this number if you have problems the morning of surgery: [phone]               Any questions prior to day of surgery, please call [phone] between 8 & 4 PM.   Remember:  Do not eat food or drink liquids after midnight Monday, 11/24/14.  Take these medicines the morning of surgery with A SIP OF WATER:propranolol (INDERAL),  sertraline (ZOLOFT)   STOP HERBAL SUPPLEMENTS, ASPIRIN, NSAIDS (ADVIL, ALEVE, IBUPORFEN) ONE WEEK  PRIOR TO SURGERY   Do not wear jewelry.  Do not wear lotions, powders, or cologne.  You may wear deodorant.  Men may shave face and neck.  Do not bring valuables to the hospital.  [facility] is not responsible for any belongings or valuables.  Contacts, dentures or bridgework may not be worn into surgery.  Leave your suitcase in the car.  After surgery it may be brought to your room.  For patients admitted to the hospital, discharge time will be determined by your treatment team.  Special instructions:  See "Preparing for Surgery" Instruction sheet.   Please read over the following fact sheets that you were given. Pain Booklet, Coughing and Deep Breathing, Blood Transfusion Information, MRSA Information and Surgical Site Infection Prevention

## 2014-11-13 ENCOUNTER — Encounter (HOSPITAL_COMMUNITY)
Admission: RE | Admit: 2014-11-13 | Discharge: 2014-11-13 | Disposition: A | Discharge status: Home / Self Care | Payer: Medicare Other | Source: Ambulatory Visit | Attending: Orthopaedic Surgery | Admitting: Orthopaedic Surgery

## 2014-11-13 DIAGNOSIS — Z0183 Encounter for blood typing: Secondary | ICD-10-CM | POA: Diagnosis not present

## 2014-11-13 DIAGNOSIS — M19071 Primary osteoarthritis, right ankle and foot: Secondary | ICD-10-CM | POA: Diagnosis not present

## 2014-11-13 DIAGNOSIS — Z01818 Encounter for other preprocedural examination: Secondary | ICD-10-CM

## 2014-11-13 DIAGNOSIS — Z01812 Encounter for preprocedural laboratory examination: Secondary | ICD-10-CM | POA: Diagnosis not present

## 2014-11-13 LAB — SURGICAL PCR SCREEN
MRSA, PCR: NEGATIVE
Staphylococcus aureus: NEGATIVE

## 2014-11-13 LAB — BASIC METABOLIC PANEL
ANION GAP: 7 (ref 5–15)
BUN: 19 mg/dL (ref 6–20)
CHLORIDE: 109 mmol/L (ref 101–111)
CO2: 28 mmol/L (ref 22–32)
CREATININE: 1.33 mg/dL — AB (ref 0.61–1.24)
Calcium: 8.8 mg/dL — ABNORMAL LOW (ref 8.9–10.3)
GFR calc Af Amer: 55 mL/min — ABNORMAL LOW (ref >60)
GFR calc non Af Amer: 48 mL/min — ABNORMAL LOW (ref >60)
Glucose, Bld: 96 mg/dL (ref 65–99)
Potassium: 4.5 mmol/L (ref 3.5–5.1)
Sodium: 144 mmol/L (ref 135–145)

## 2014-11-13 LAB — URINALYSIS, ROUTINE W REFLEX MICROSCOPIC
Bilirubin Urine: NEGATIVE
Glucose, UA: NEGATIVE mg/dL
Hgb urine dipstick: NEGATIVE
Ketones, ur: NEGATIVE mg/dL
Leukocytes, UA: NEGATIVE
Nitrite: NEGATIVE
Protein, ur: NEGATIVE mg/dL
Specific Gravity, Urine: 1.023 (ref 1.005–1.030)
Urobilinogen, UA: 0.2 mg/dL (ref 0.0–1.0)
pH: 6.5 (ref 5.0–8.0)

## 2014-11-13 LAB — CBC WITH DIFFERENTIAL/PLATELET
BASOS ABS: 0 10*3/uL (ref 0.0–0.1)
Basophils Relative: 0 % (ref 0–1)
Eosinophils Absolute: 0.1 10*3/uL (ref 0.0–0.7)
Eosinophils Relative: 2 % (ref 0–5)
HEMATOCRIT: 38.1 % — AB (ref 39.0–52.0)
Hemoglobin: 12.5 g/dL — ABNORMAL LOW (ref 13.0–17.0)
Lymphs Abs: 0.8 10*3/uL (ref 0.7–4.0)
MCH: 31.2 pg (ref 26.0–34.0)
MCHC: 32.8 g/dL (ref 30.0–36.0)
MCV: 95 fL (ref 78.0–100.0)
Monocytes Absolute: 0.7 10*3/uL (ref 0.1–1.0)
NEUTROS ABS: 5 10*3/uL (ref 1.7–7.7)
PLATELETS: 111 10*3/uL — AB (ref 150–400)
RBC: 4.01 MIL/uL — AB (ref 4.22–5.81)
RDW: 13.2 % (ref 11.5–15.5)
WBC: 6.6 10*3/uL (ref 4.0–10.5)

## 2014-11-13 LAB — ABO/RH: ABO/RH(D): O POS

## 2014-11-13 LAB — APTT: aPTT: 26 seconds (ref 24–37)

## 2014-11-13 LAB — TYPE AND SCREEN
ABO/RH(D): O POS
ANTIBODY SCREEN: NEGATIVE

## 2014-11-13 LAB — PROTIME-INR
INR: 1.07 (ref 0.00–1.49)
Prothrombin Time: 14.1 seconds (ref 11.6–15.2)

## 2014-11-13 MED ORDER — CHLORHEXIDINE GLUCONATE 4 % EX LIQD: 60.0000 mL | Freq: Once | CUTANEOUS | Status: DC

## 2014-11-17 DIAGNOSIS — Z8546 Personal history of malignant neoplasm of prostate: Secondary | ICD-10-CM | POA: Diagnosis not present

## 2014-11-17 DIAGNOSIS — C61 Malignant neoplasm of prostate: Secondary | ICD-10-CM | POA: Diagnosis not present

## 2014-11-17 DIAGNOSIS — N320 Bladder-neck obstruction: Secondary | ICD-10-CM | POA: Diagnosis not present

## 2014-11-17 DIAGNOSIS — N528 Other male erectile dysfunction: Secondary | ICD-10-CM | POA: Diagnosis not present

## 2014-11-17 DIAGNOSIS — N393 Stress incontinence (female) (male): Secondary | ICD-10-CM | POA: Diagnosis not present

## 2014-11-19 DIAGNOSIS — M1711 Unilateral primary osteoarthritis, right knee: Secondary | ICD-10-CM | POA: Diagnosis not present

## 2014-11-19 DIAGNOSIS — M25561 Pain in right knee: Secondary | ICD-10-CM | POA: Diagnosis not present

## 2014-11-19 NOTE — H&P (Signed)
TOTAL KNEE ADMISSION H&P  Patient is being admitted for right total knee arthroplasty.  Subjective:  Chief Complaint:right knee pain.  HPI: Ryan Humphrey, 81 y.o. male, has a history of pain and functional disability in the right knee due to arthritis and has failed non-surgical conservative treatments for greater than 12 weeks to includeNSAID's and/or analgesics, corticosteriod injections, viscosupplementation injections, flexibility and strengthening excercises, use of assistive devices, weight reduction as appropriate and activity modification.  Onset of symptoms was gradual, starting 5 years ago with gradually worsening course since that time. The patient noted no past surgery on the right knee(s).  Patient currently rates pain in the right knee(s) at 10 out of 10 with activity. Patient has night pain, worsening of pain with activity and weight bearing, pain that interferes with activities of daily living, crepitus and joint swelling.  Patient has evidence of subchondral cysts, subchondral sclerosis, periarticular osteophytes and joint space narrowing by imaging studies. There is no active infection.  Patient Active Problem List   Diagnosis Date Noted  . INTERMITTENT VERTIGO 10/12/2009  . HYPERLIPIDEMIA-MIXED 04/10/2009  . HYPERTENSION, BENIGN 04/10/2009  . CORONARY ATHEROSCLEROSIS NATIVE CORONARY ARTERY 04/10/2009   Past Medical History  Diagnosis Date  . Neuropathy   . Hyperlipidemia   . Arthritis     arthritis,osteopenia,"spinal stenosis"  . GERD (gastroesophageal reflux disease)   . H/O hiatal hernia   . H/O vertigo 12-06-12    none recent  . Anemia   . Depression   . Cancer 12-06-12    Prostate cancer'98  . Peripheral neuropathy 12-06-12    peripheral neuropathy  . RLS (restless legs syndrome)   . Rhinitis   . CAD (coronary artery disease)   . Osteopenia   . Vitamin B12 deficiency   . IBS (irritable bowel syndrome)     Past Surgical History  Procedure Laterality Date   . Appendectomy    . Cholecystectomy    . Carpal tunnel release      both hands  . Prostate surgery  12-06-12  . Cataract surgery Left 12-06-12    recent surgery  . Esophagogastroduodenoscopy (egd) with propofol N/A 12/25/2012    Procedure: ESOPHAGOGASTRODUODENOSCOPY (EGD) WITH PROPOFOL;  Surgeon: Garlan Fair, MD;  Location: [facility];  Service: Endoscopy;  Laterality: N/A;  . Colonoscopy with propofol N/A 12/25/2012    Procedure: COLONOSCOPY WITH PROPOFOL;  Surgeon: Garlan Fair, MD;  Location: [facility];  Service: Endoscopy;  Laterality: N/A;  . Tonsillectomy      No prescriptions prior to admission   Allergies  Allergen Reactions  . Penicillins Other (See Comments)    "rash"    History  Substance Use Topics  . Smoking status: Former Smoker -- 0.50 packs/day for 30 years    Quit date: 06/14/1963  . Smokeless tobacco: Never Used  . Alcohol Use: No    Family History  Problem Relation Age of Onset  . CAD Father     Deceased, 40  . Dementia Mother     Deceased, 83  . Colon cancer Neg Hx   . Colon polyps Neg Hx   . Kidney disease Neg Hx   . Diabetes Neg Hx   . Esophageal cancer Neg Hx   . Gallbladder disease Neg Hx      Review of Systems  Musculoskeletal: Positive for joint pain.       Right knee  All other systems reviewed and are negative.   Objective:  Physical Exam  Constitutional: He is oriented  to person, place, and time. He appears well-developed and well-nourished.  HENT:  Head: Normocephalic and atraumatic.  Eyes: Pupils are equal, round, and reactive to light.  Neck: Normal range of motion.  Cardiovascular: Normal rate and regular rhythm.   Respiratory: Effort normal.  GI: Soft.  Musculoskeletal:  Right knee motion 0-1 20.  Significant crepitation at 1+ calf soft and nontender.  Stable ligaments.  Significant pain with palpation medial joint line and to a lesser degree lateral joint line.  Hip motion is excellent  Neurological: He is  alert and oriented to person, place, and time.  Skin: Skin is warm.  Psychiatric: He has a normal mood and affect. His behavior is normal. Judgment and thought content normal.    Vital signs in last 24 hours:    Labs:   Estimated body mass index is 24.85 kg/(m^2) as calculated from the following:   Height as of 09/03/14: 5\' 11"  (1.803 m).   Weight as of 10/10/14: 80.797 kg (178 lb 2 oz).   Imaging Review Plain radiographs demonstrate severe degenerative joint disease of the right knee(s). The overall alignment isneutral. The bone quality appears to be good for age and reported activity level.  Assessment/Plan:  End stage primary arthritis, right knee   The patient history, physical examination, clinical judgment of the provider and imaging studies are consistent with end stage degenerative joint disease of the right knee(s) and total knee arthroplasty is deemed medically necessary. The treatment options including medical management, injection therapy arthroscopy and arthroplasty were discussed at length. The risks and benefits of total knee arthroplasty were presented and reviewed. The risks due to aseptic loosening, infection, stiffness, patella tracking problems, thromboembolic complications and other imponderables were discussed. The patient acknowledged the explanation, agreed to proceed with the plan and consent was signed. Patient is being admitted for inpatient treatment for surgery, pain control, PT, OT, prophylactic antibiotics, VTE prophylaxis, progressive ambulation and ADL's and discharge planning. The patient is planning to be discharged home with home health services

## 2014-11-25 ENCOUNTER — Encounter (HOSPITAL_COMMUNITY)
Admission: RE | Disposition: A | Discharge status: Skilled Nursing Facility | Payer: Self-pay | Source: Ambulatory Visit | Attending: Orthopaedic Surgery

## 2014-11-25 ENCOUNTER — Inpatient Hospital Stay (HOSPITAL_COMMUNITY): Payer: Medicare Other | Admitting: Anesthesiology

## 2014-11-25 ENCOUNTER — Inpatient Hospital Stay (HOSPITAL_COMMUNITY)
Admission: RE | Admit: 2014-11-25 | Discharge: 2014-11-27 | DRG: 470 | Disposition: A | Discharge status: Skilled Nursing Facility | Payer: Medicare Other | Source: Ambulatory Visit | Attending: Orthopaedic Surgery | Admitting: Orthopaedic Surgery

## 2014-11-25 ENCOUNTER — Encounter (HOSPITAL_COMMUNITY): Payer: Self-pay | Admitting: Surgery

## 2014-11-25 DIAGNOSIS — R52 Pain, unspecified: Secondary | ICD-10-CM

## 2014-11-25 DIAGNOSIS — G2581 Restless legs syndrome: Secondary | ICD-10-CM | POA: Diagnosis present

## 2014-11-25 DIAGNOSIS — Z880 Allergy status to penicillin: Secondary | ICD-10-CM

## 2014-11-25 DIAGNOSIS — Z8546 Personal history of malignant neoplasm of prostate: Secondary | ICD-10-CM

## 2014-11-25 DIAGNOSIS — E782 Mixed hyperlipidemia: Secondary | ICD-10-CM | POA: Diagnosis present

## 2014-11-25 DIAGNOSIS — Z9842 Cataract extraction status, left eye: Secondary | ICD-10-CM

## 2014-11-25 DIAGNOSIS — K219 Gastro-esophageal reflux disease without esophagitis: Secondary | ICD-10-CM | POA: Diagnosis present

## 2014-11-25 DIAGNOSIS — M8580 Other specified disorders of bone density and structure, unspecified site: Secondary | ICD-10-CM | POA: Diagnosis present

## 2014-11-25 DIAGNOSIS — S8991XA Unspecified injury of right lower leg, initial encounter: Secondary | ICD-10-CM | POA: Diagnosis not present

## 2014-11-25 DIAGNOSIS — K589 Irritable bowel syndrome without diarrhea: Secondary | ICD-10-CM | POA: Diagnosis present

## 2014-11-25 DIAGNOSIS — G629 Polyneuropathy, unspecified: Secondary | ICD-10-CM | POA: Diagnosis present

## 2014-11-25 DIAGNOSIS — M1711 Unilateral primary osteoarthritis, right knee: Secondary | ICD-10-CM | POA: Diagnosis present

## 2014-11-25 DIAGNOSIS — G8918 Other acute postprocedural pain: Secondary | ICD-10-CM | POA: Diagnosis not present

## 2014-11-25 DIAGNOSIS — I2510 Atherosclerotic heart disease of native coronary artery without angina pectoris: Secondary | ICD-10-CM | POA: Diagnosis present

## 2014-11-25 DIAGNOSIS — Z9049 Acquired absence of other specified parts of digestive tract: Secondary | ICD-10-CM | POA: Diagnosis present

## 2014-11-25 DIAGNOSIS — M25561 Pain in right knee: Secondary | ICD-10-CM | POA: Diagnosis present

## 2014-11-25 DIAGNOSIS — F329 Major depressive disorder, single episode, unspecified: Secondary | ICD-10-CM | POA: Diagnosis present

## 2014-11-25 DIAGNOSIS — Z87891 Personal history of nicotine dependence: Secondary | ICD-10-CM

## 2014-11-25 DIAGNOSIS — S299XXA Unspecified injury of thorax, initial encounter: Secondary | ICD-10-CM | POA: Diagnosis not present

## 2014-11-25 DIAGNOSIS — M6281 Muscle weakness (generalized): Secondary | ICD-10-CM | POA: Diagnosis not present

## 2014-11-25 DIAGNOSIS — Z471 Aftercare following joint replacement surgery: Secondary | ICD-10-CM | POA: Diagnosis not present

## 2014-11-25 DIAGNOSIS — M4800 Spinal stenosis, site unspecified: Secondary | ICD-10-CM | POA: Diagnosis present

## 2014-11-25 DIAGNOSIS — R0781 Pleurodynia: Secondary | ICD-10-CM | POA: Diagnosis not present

## 2014-11-25 DIAGNOSIS — Z96651 Presence of right artificial knee joint: Secondary | ICD-10-CM | POA: Diagnosis not present

## 2014-11-25 DIAGNOSIS — M179 Osteoarthritis of knee, unspecified: Secondary | ICD-10-CM | POA: Diagnosis not present

## 2014-11-25 DIAGNOSIS — I10 Essential (primary) hypertension: Secondary | ICD-10-CM | POA: Diagnosis present

## 2014-11-25 DIAGNOSIS — R262 Difficulty in walking, not elsewhere classified: Secondary | ICD-10-CM | POA: Diagnosis not present

## 2014-11-25 DIAGNOSIS — M810 Age-related osteoporosis without current pathological fracture: Secondary | ICD-10-CM | POA: Diagnosis not present

## 2014-11-25 DIAGNOSIS — R278 Other lack of coordination: Secondary | ICD-10-CM | POA: Diagnosis not present

## 2014-11-25 DIAGNOSIS — W19XXXA Unspecified fall, initial encounter: Secondary | ICD-10-CM

## 2014-11-25 HISTORY — PX: TOTAL KNEE ARTHROPLASTY: SHX125

## 2014-11-25 SURGERY — ARTHROPLASTY, KNEE, TOTAL
Anesthesia: Regional | Site: Knee | Laterality: Right

## 2014-11-25 MED ORDER — METOCLOPRAMIDE HCL 5 MG PO TABS
5.0000 mg | ORAL_TABLET | Freq: Three times a day (TID) | ORAL | Status: DC | PRN
  Filled 2014-11-25: qty 2

## 2014-11-25 MED ORDER — SODIUM CHLORIDE 0.9 % IR SOLN
Status: DC | PRN
  Administered 2014-11-25 (×2): 1000 mL

## 2014-11-25 MED ORDER — HYDROMORPHONE HCL 1 MG/ML IJ SOLN
0.5000 mg | INTRAMUSCULAR | Status: DC | PRN
  Administered 2014-11-25: 1 mg via INTRAVENOUS
  Filled 2014-11-25: qty 1

## 2014-11-25 MED ORDER — EPHEDRINE SULFATE 50 MG/ML IJ SOLN
INTRAMUSCULAR | Status: AC
  Filled 2014-11-25: qty 1

## 2014-11-25 MED ORDER — PHENYLEPHRINE HCL 10 MG/ML IJ SOLN
INTRAMUSCULAR | Status: DC | PRN
  Administered 2014-11-25: 80 ug via INTRAVENOUS
  Administered 2014-11-25: 100 ug via INTRAVENOUS

## 2014-11-25 MED ORDER — LACTATED RINGERS IV SOLN
INTRAVENOUS | Status: DC
  Administered 2014-11-25 – 2014-11-26 (×2): via INTRAVENOUS

## 2014-11-25 MED ORDER — METOCLOPRAMIDE HCL 5 MG/ML IJ SOLN
5.0000 mg | Freq: Three times a day (TID) | INTRAMUSCULAR | Status: DC | PRN
  Filled 2014-11-25: qty 2

## 2014-11-25 MED ORDER — ALUM & MAG HYDROXIDE-SIMETH 200-200-20 MG/5ML PO SUSP: 30.0000 mL | ORAL | Status: DC | PRN

## 2014-11-25 MED ORDER — HYDROCODONE-ACETAMINOPHEN 5-325 MG PO TABS
1.0000 | ORAL_TABLET | ORAL | Status: DC | PRN
  Administered 2014-11-25 (×2): 2 via ORAL
  Administered 2014-11-26: 1 via ORAL
  Administered 2014-11-26: 2 via ORAL
  Administered 2014-11-26: 1 via ORAL
  Administered 2014-11-26: 2 via ORAL
  Administered 2014-11-27: 1 via ORAL
  Filled 2014-11-25 (×2): qty 1
  Filled 2014-11-25: qty 2
  Filled 2014-11-25: qty 1
  Filled 2014-11-25 (×3): qty 2

## 2014-11-25 MED ORDER — FENTANYL CITRATE (PF) 250 MCG/5ML IJ SOLN
INTRAMUSCULAR | Status: AC
  Filled 2014-11-25: qty 5

## 2014-11-25 MED ORDER — DIPHENOXYLATE-ATROPINE 2.5-0.025 MG PO TABS: 1.0000 | ORAL_TABLET | Freq: Four times a day (QID) | ORAL | Status: DC | PRN

## 2014-11-25 MED ORDER — OXYCODONE HCL 5 MG PO TABS: 5.0000 mg | ORAL_TABLET | Freq: Once | ORAL | Status: DC | PRN

## 2014-11-25 MED ORDER — BUPIVACAINE LIPOSOME 1.3 % IJ SUSP
INTRAMUSCULAR | Status: DC | PRN
  Administered 2014-11-25: 20 mL

## 2014-11-25 MED ORDER — BUPIVACAINE LIPOSOME 1.3 % IJ SUSP
20.0000 mL | INTRAMUSCULAR | Status: DC
  Filled 2014-11-25: qty 20

## 2014-11-25 MED ORDER — BUPIVACAINE-EPINEPHRINE (PF) 0.25% -1:200000 IJ SOLN
INTRAMUSCULAR | Status: DC | PRN
  Administered 2014-11-25: 20 mL

## 2014-11-25 MED ORDER — BUPIVACAINE-EPINEPHRINE (PF) 0.5% -1:200000 IJ SOLN
INTRAMUSCULAR | Status: DC | PRN
  Administered 2014-11-25: 25 mL via PERINEURAL

## 2014-11-25 MED ORDER — VANCOMYCIN HCL IN DEXTROSE 1-5 GM/200ML-% IV SOLN
1000.0000 mg | INTRAVENOUS | Status: AC
  Administered 2014-11-25: 1000 mg via INTRAVENOUS
  Filled 2014-11-25: qty 200

## 2014-11-25 MED ORDER — MENTHOL 3 MG MT LOZG: 1.0000 | LOZENGE | OROMUCOSAL | Status: DC | PRN

## 2014-11-25 MED ORDER — LACTATED RINGERS IV SOLN
INTRAVENOUS | Status: DC
  Administered 2014-11-25 (×2): via INTRAVENOUS

## 2014-11-25 MED ORDER — LIDOCAINE HCL (CARDIAC) 20 MG/ML IV SOLN
INTRAVENOUS | Status: AC
  Filled 2014-11-25: qty 10

## 2014-11-25 MED ORDER — PHENOL 1.4 % MT LIQD: 1.0000 | OROMUCOSAL | Status: DC | PRN

## 2014-11-25 MED ORDER — SERTRALINE HCL 100 MG PO TABS
100.0000 mg | ORAL_TABLET | Freq: Every day | ORAL | Status: DC
  Administered 2014-11-25 – 2014-11-27 (×3): 100 mg via ORAL
  Filled 2014-11-25 (×3): qty 1

## 2014-11-25 MED ORDER — HYDROMORPHONE HCL 1 MG/ML IJ SOLN: 0.2500 mg | INTRAMUSCULAR | Status: DC | PRN

## 2014-11-25 MED ORDER — ALENDRONATE SODIUM 70 MG PO TABS: 70.0000 mg | ORAL_TABLET | ORAL | Status: DC

## 2014-11-25 MED ORDER — DOCUSATE SODIUM 100 MG PO CAPS
100.0000 mg | ORAL_CAPSULE | Freq: Two times a day (BID) | ORAL | Status: DC
  Administered 2014-11-25 – 2014-11-27 (×5): 100 mg via ORAL
  Filled 2014-11-25 (×5): qty 1

## 2014-11-25 MED ORDER — DIPHENHYDRAMINE HCL 12.5 MG/5ML PO ELIX: 12.5000 mg | ORAL_SOLUTION | ORAL | Status: DC | PRN

## 2014-11-25 MED ORDER — SIMVASTATIN 20 MG PO TABS
20.0000 mg | ORAL_TABLET | Freq: Every evening | ORAL | Status: DC
  Administered 2014-11-25 – 2014-11-26 (×2): 20 mg via ORAL
  Filled 2014-11-25 (×3): qty 1

## 2014-11-25 MED ORDER — BUPIVACAINE-EPINEPHRINE (PF) 0.25% -1:200000 IJ SOLN
INTRAMUSCULAR | Status: AC
  Filled 2014-11-25: qty 30

## 2014-11-25 MED ORDER — FENTANYL CITRATE (PF) 250 MCG/5ML IJ SOLN
INTRAMUSCULAR | Status: DC | PRN
  Administered 2014-11-25 (×5): 50 ug via INTRAVENOUS

## 2014-11-25 MED ORDER — TRANEXAMIC ACID 1000 MG/10ML IV SOLN
2000.0000 mg | Freq: Once | INTRAVENOUS | Status: DC
  Filled 2014-11-25: qty 20

## 2014-11-25 MED ORDER — PROPRANOLOL HCL 20 MG PO TABS
20.0000 mg | ORAL_TABLET | Freq: Two times a day (BID) | ORAL | Status: DC
  Administered 2014-11-25 – 2014-11-27 (×5): 20 mg via ORAL
  Filled 2014-11-25 (×8): qty 1

## 2014-11-25 MED ORDER — METHOCARBAMOL 500 MG PO TABS
500.0000 mg | ORAL_TABLET | Freq: Four times a day (QID) | ORAL | Status: DC | PRN
  Administered 2014-11-25 – 2014-11-26 (×2): 500 mg via ORAL
  Filled 2014-11-25 (×2): qty 1

## 2014-11-25 MED ORDER — MEPERIDINE HCL 25 MG/ML IJ SOLN: 6.2500 mg | INTRAMUSCULAR | Status: DC | PRN

## 2014-11-25 MED ORDER — ACETAMINOPHEN 650 MG RE SUPP: 650.0000 mg | Freq: Four times a day (QID) | RECTAL | Status: DC | PRN

## 2014-11-25 MED ORDER — BISACODYL 5 MG PO TBEC: 5.0000 mg | DELAYED_RELEASE_TABLET | Freq: Every day | ORAL | Status: DC | PRN

## 2014-11-25 MED ORDER — PROPOFOL 10 MG/ML IV BOLUS
INTRAVENOUS | Status: AC
  Filled 2014-11-25: qty 20

## 2014-11-25 MED ORDER — TRANEXAMIC ACID 1000 MG/10ML IV SOLN
2000.0000 mg | INTRAVENOUS | Status: DC | PRN
  Administered 2014-11-25: 2000 mg via INTRAVENOUS

## 2014-11-25 MED ORDER — METHOCARBAMOL 1000 MG/10ML IJ SOLN
500.0000 mg | Freq: Four times a day (QID) | INTRAVENOUS | Status: DC | PRN
  Filled 2014-11-25: qty 5

## 2014-11-25 MED ORDER — GABAPENTIN 300 MG PO CAPS
300.0000 mg | ORAL_CAPSULE | Freq: Four times a day (QID) | ORAL | Status: DC
  Administered 2014-11-25 – 2014-11-27 (×9): 300 mg via ORAL
  Filled 2014-11-25 (×9): qty 1

## 2014-11-25 MED ORDER — ONDANSETRON HCL 4 MG PO TABS
4.0000 mg | ORAL_TABLET | Freq: Four times a day (QID) | ORAL | Status: DC | PRN
  Filled 2014-11-25: qty 1

## 2014-11-25 MED ORDER — LIDOCAINE HCL (CARDIAC) 20 MG/ML IV SOLN
INTRAVENOUS | Status: DC | PRN
  Administered 2014-11-25: 20 mg via INTRAVENOUS

## 2014-11-25 MED ORDER — ACETAMINOPHEN 325 MG PO TABS
650.0000 mg | ORAL_TABLET | Freq: Four times a day (QID) | ORAL | Status: DC | PRN
Start: 2014-11-25 — End: 2014-11-27

## 2014-11-25 MED ORDER — ASPIRIN EC 325 MG PO TBEC
325.0000 mg | DELAYED_RELEASE_TABLET | Freq: Two times a day (BID) | ORAL | Status: DC
  Administered 2014-11-26 – 2014-11-27 (×3): 325 mg via ORAL
  Filled 2014-11-25 (×3): qty 1

## 2014-11-25 MED ORDER — SODIUM CHLORIDE 0.9 % IJ SOLN
INTRAMUSCULAR | Status: AC
  Filled 2014-11-25: qty 10

## 2014-11-25 MED ORDER — MIDAZOLAM HCL 2 MG/2ML IJ SOLN
INTRAMUSCULAR | Status: AC
  Filled 2014-11-25: qty 2

## 2014-11-25 MED ORDER — VANCOMYCIN HCL IN DEXTROSE 1-5 GM/200ML-% IV SOLN
1000.0000 mg | Freq: Two times a day (BID) | INTRAVENOUS | Status: AC
  Administered 2014-11-25: 1000 mg via INTRAVENOUS
  Filled 2014-11-25: qty 200

## 2014-11-25 MED ORDER — ONDANSETRON HCL 4 MG/2ML IJ SOLN
4.0000 mg | Freq: Four times a day (QID) | INTRAMUSCULAR | Status: DC | PRN
  Filled 2014-11-25: qty 2

## 2014-11-25 MED ORDER — PROPOFOL 10 MG/ML IV BOLUS
INTRAVENOUS | Status: DC | PRN
  Administered 2014-11-25 (×2): 20 mg via INTRAVENOUS

## 2014-11-25 MED ORDER — PROPOFOL INFUSION 10 MG/ML OPTIME
INTRAVENOUS | Status: DC | PRN
  Administered 2014-11-25: 50 ug/kg/min via INTRAVENOUS

## 2014-11-25 MED ORDER — OXYCODONE HCL 5 MG/5ML PO SOLN: 5.0000 mg | Freq: Once | ORAL | Status: DC | PRN

## 2014-11-25 SURGICAL SUPPLY — 72 items
BAG DECANTER FOR FLEXI CONT (MISCELLANEOUS) ×3 IMPLANT
BANDAGE ELASTIC 4 VELCRO ST LF (GAUZE/BANDAGES/DRESSINGS) IMPLANT
BANDAGE ESMARK 6X9 LF (GAUZE/BANDAGES/DRESSINGS) ×1 IMPLANT
BENZOIN TINCTURE PRP APPL 2/3 (GAUZE/BANDAGES/DRESSINGS) ×3 IMPLANT
BLADE SAGITTAL 25.0X1.19X90 (BLADE) ×2 IMPLANT
BLADE SAGITTAL 25.0X1.19X90MM (BLADE) ×1
BLADE SAW SGTL 13.0X1.19X90.0M (BLADE) IMPLANT
BLADE SURG ROTATE 9660 (MISCELLANEOUS) IMPLANT
BNDG ELASTIC 6X10 VLCR STRL LF (GAUZE/BANDAGES/DRESSINGS) ×3 IMPLANT
BNDG ESMARK 6X9 LF (GAUZE/BANDAGES/DRESSINGS) ×3
BNDG GAUZE ELAST 4 BULKY (GAUZE/BANDAGES/DRESSINGS) ×3 IMPLANT
BOWL SMART MIX CTS (DISPOSABLE) ×3 IMPLANT
CAP KNEE TOTAL 3 SIGMA ×3 IMPLANT
CEMENT HV SMART SET (Cement) ×6 IMPLANT
CLOSURE WOUND 1/2 X4 (GAUZE/BANDAGES/DRESSINGS) ×1
COVER SURGICAL LIGHT HANDLE (MISCELLANEOUS) ×3 IMPLANT
CUFF TOURNIQUET SINGLE 34IN LL (TOURNIQUET CUFF) ×3 IMPLANT
CUFF TOURNIQUET SINGLE 44IN (TOURNIQUET CUFF) IMPLANT
DRAPE EXTREMITY T 121X128X90 (DRAPE) ×3 IMPLANT
DRAPE IMP U-DRAPE 54X76 (DRAPES) ×3 IMPLANT
DRAPE PROXIMA HALF (DRAPES) ×3 IMPLANT
DRAPE U-SHAPE 47X51 STRL (DRAPES) ×3 IMPLANT
DRSG ADAPTIC 3X8 NADH LF (GAUZE/BANDAGES/DRESSINGS) ×3 IMPLANT
DRSG PAD ABDOMINAL 8X10 ST (GAUZE/BANDAGES/DRESSINGS) ×3 IMPLANT
DURAPREP 26ML APPLICATOR (WOUND CARE) ×3 IMPLANT
ELECT REM PT RETURN 9FT ADLT (ELECTROSURGICAL) ×3
ELECTRODE REM PT RTRN 9FT ADLT (ELECTROSURGICAL) ×1 IMPLANT
GAUZE SPONGE 4X4 12PLY STRL (GAUZE/BANDAGES/DRESSINGS) IMPLANT
GLOVE BIO SURGEON STRL SZ7.5 (GLOVE) ×3 IMPLANT
GLOVE BIO SURGEON STRL SZ8 (GLOVE) ×6 IMPLANT
GLOVE BIOGEL PI IND STRL 6.5 (GLOVE) ×3 IMPLANT
GLOVE BIOGEL PI IND STRL 7.0 (GLOVE) ×1 IMPLANT
GLOVE BIOGEL PI IND STRL 8 (GLOVE) ×2 IMPLANT
GLOVE BIOGEL PI INDICATOR 6.5 (GLOVE) ×6
GLOVE BIOGEL PI INDICATOR 7.0 (GLOVE) ×2
GLOVE BIOGEL PI INDICATOR 8 (GLOVE) ×4
GLOVE SURG SS PI 6.5 STRL IVOR (GLOVE) ×3 IMPLANT
GOWN STRL REUS W/ TWL LRG LVL3 (GOWN DISPOSABLE) ×2 IMPLANT
GOWN STRL REUS W/ TWL XL LVL3 (GOWN DISPOSABLE) ×2 IMPLANT
GOWN STRL REUS W/TWL LRG LVL3 (GOWN DISPOSABLE) ×4
GOWN STRL REUS W/TWL XL LVL3 (GOWN DISPOSABLE) ×4
HANDPIECE INTERPULSE COAX TIP (DISPOSABLE) ×2
HOOD PEEL AWAY FACE SHEILD DIS (HOOD) ×6 IMPLANT
IMMOBILIZER KNEE 20 (SOFTGOODS) IMPLANT
IMMOBILIZER KNEE 22 UNIV (SOFTGOODS) ×3 IMPLANT
IMMOBILIZER KNEE 24 THIGH 36 (MISCELLANEOUS) IMPLANT
IMMOBILIZER KNEE 24 UNIV (MISCELLANEOUS)
KIT BASIN OR (CUSTOM PROCEDURE TRAY) ×3 IMPLANT
KIT ROOM TURNOVER OR (KITS) ×3 IMPLANT
MANIFOLD NEPTUNE II (INSTRUMENTS) ×3 IMPLANT
NEEDLE 22X1 1/2 (OR ONLY) (NEEDLE) ×3 IMPLANT
NEEDLE HYPO 21X1 ECLIPSE (NEEDLE) ×3 IMPLANT
NS IRRIG 1000ML POUR BTL (IV SOLUTION) ×3 IMPLANT
PACK TOTAL JOINT (CUSTOM PROCEDURE TRAY) ×3 IMPLANT
PACK UNIVERSAL I (CUSTOM PROCEDURE TRAY) ×3 IMPLANT
PAD ABD 8X10 STRL (GAUZE/BANDAGES/DRESSINGS) ×3 IMPLANT
PAD ARMBOARD 7.5X6 YLW CONV (MISCELLANEOUS) ×6 IMPLANT
SET HNDPC FAN SPRY TIP SCT (DISPOSABLE) ×1 IMPLANT
STAPLER VISISTAT 35W (STAPLE) IMPLANT
STRIP CLOSURE SKIN 1/2X4 (GAUZE/BANDAGES/DRESSINGS) ×2 IMPLANT
SUCTION FRAZIER TIP 10 FR DISP (SUCTIONS) ×3 IMPLANT
SUT MNCRL AB 3-0 PS2 18 (SUTURE) ×3 IMPLANT
SUT VIC AB 0 CT1 27 (SUTURE) ×4
SUT VIC AB 0 CT1 27XBRD ANBCTR (SUTURE) ×2 IMPLANT
SUT VIC AB 2-0 CT1 27 (SUTURE) ×4
SUT VIC AB 2-0 CT1 TAPERPNT 27 (SUTURE) ×2 IMPLANT
SUT VLOC 180 0 24IN GS25 (SUTURE) ×3 IMPLANT
SYR 50ML LL SCALE MARK (SYRINGE) ×3 IMPLANT
TOWEL OR 17X24 6PK STRL BLUE (TOWEL DISPOSABLE) ×3 IMPLANT
TOWEL OR 17X26 10 PK STRL BLUE (TOWEL DISPOSABLE) ×3 IMPLANT
TRAY FOLEY CATH 14FR (SET/KITS/TRAYS/PACK) IMPLANT
WATER STERILE IRR 1000ML POUR (IV SOLUTION) ×3 IMPLANT

## 2014-11-25 NOTE — Anesthesia Postprocedure Evaluation (Signed)
  Anesthesia Post-op Note  Patient: Ryan Humphrey  Procedure(s) Performed: Procedure(s): RIGHT TOTAL KNEE ARTHROPLASTY (Right)  Patient Location: PACU  Anesthesia Type: Spinal, Regional   Level of Consciousness: awake, alert  and oriented  Airway and Oxygen Therapy: Patient Spontanous Breathing  Post-op Pain: none  Post-op Assessment: Post-op Vital signs reviewed  Post-op Vital Signs: Reviewed  Last Vitals:  Filed Vitals:   11/25/14 0947  BP: 124/68  Pulse: 83  Temp:   Resp: 22    Complications: No apparent anesthesia complications

## 2014-11-25 NOTE — Progress Notes (Signed)
Orthopedic Tech Progress Note Patient Details:  Ryan Humphrey 27-Nov-1928 [id number] CPM applied to RLE with appropriate settings. OHF applied to bed.  CPM Right Knee CPM Right Knee: On Right Knee Flexion (Degrees): 90 Right Knee Extension (Degrees): 0   Asia R Thompson 11/25/2014, 10:59 AM

## 2014-11-25 NOTE — Care Management (Signed)
Utilization review completed by Haston Casebolt N. Flavio Lindroth, RN BSN 

## 2014-11-25 NOTE — Op Note (Signed)
PREOP DIAGNOSIS: DJD RIGHT KNEE POSTOP DIAGNOSIS: same PROCEDURE: RIGHT TKR ANESTHESIA: Spinal and block ATTENDING SURGEON: Season Astacio G ASSISTANT: Loni Dolly PA  INDICATIONS FOR PROCEDURE: Ryan Humphrey is a 81 y.o. male who has struggled for a long time with pain due to degenerative arthritis of the right knee.  The patient has failed many conservative non-operative measures and at this point has pain which limits the ability to sleep and walk.  The patient is offered total knee replacement.  Informed operative consent was obtained after discussion of possible risks of anesthesia, infection, neurovascular injury, DVT, and death.  The importance of the post-operative rehabilitation protocol to optimize result was stressed extensively with the patient.  SUMMARY OF FINDINGS AND PROCEDURE:  Ryan Humphrey was taken to the operative suite where under the above anesthesia a right knee replacement was performed.  There were advanced degenerative changes and the bone quality was excellent.  We used the DePuy LCS system and placed size large femur, 6 tibia, 38 mm all polyethylene patella, and a size 10 mm spacer.  Loni Dolly PA-C assisted throughout and was invaluable to the completion of the case in that he helped retract and maintain exposure while I placed components.  He also helped close thereby minimizing OR time.  The patient was admitted for appropriate post-op care to include perioperative antibiotics and mechanical and pharmacologic measures for DVT prophylaxis.  DESCRIPTION OF PROCEDURE:  Ryan Humphrey was taken to the operative suite where the above anesthesia was applied.  The patient was positioned supine and prepped and draped in normal sterile fashion.  An appropriate time out was performed.  After the administration of vancomycin pre-op antibiotic the leg was elevated and exsanguinated and a tourniquet inflated. A standard longitudinal incision was made on the anterior knee.  Dissection  was carried down to the extensor mechanism.  All appropriate anti-infective measures were used including the pre-operative antibiotic, betadine impregnated drape, and closed hooded exhaust systems for each member of the surgical team.  A medial parapatellar incision was made in the extensor mechanism and the knee cap flipped and the knee flexed.  Some residual meniscal tissues were removed along with any remaining ACL/PCL tissue.  A guide was placed on the tibia and a flat cut was made on it's superior surface.  An intramedullary guide was placed in the femur and was utilized to make anterior and posterior cuts creating an appropriate flexion gap.  A second intramedullary guide was placed in the femur to make a distal cut properly balancing the knee with an extension gap equal to the flexion gap.  The three bones sized to the above mentioned sizes and the appropriate guides were placed and utilized.  A trial reduction was done and the knee easily came to full extension and the patella tracked well on flexion.  The trial components were removed and all bones were cleaned with pulsatile lavage and then dried thoroughly.  Cement was mixed and was pressurized onto the bones followed by placement of the aforementioned components.  Excess cement was trimmed and pressure was held on the components until the cement had hardened.  The tourniquet was deflated and a small amount of bleeding was controlled with cautery and pressure.  The knee was irrigated thoroughly.  The extensor mechanism was re-approximated with V-loc suture in running fashion.  The knee was flexed and the repair was solid.  The subcutaneous tissues were re-approximated with #0 and #2-0 vicryl and the skin closed with a  subcuticular stitch and steristrips.  A sterile dressing was applied.  Intraoperative fluids, EBL, and tourniquet time can be obtained from anesthesia records.  DISPOSITION:  The patient was taken to recovery room in stable condition and  admitted for appropriate post-op care to include peri-operative antibiotic and DVT prophylaxis with mechanical and pharmacologic measures.  Reuel Lamadrid G 11/25/2014, 9:15 AM

## 2014-11-25 NOTE — Addendum Note (Signed)
Addendum  created 11/25/14 1112 by Lorrene Reid, MD   Modules edited: Anesthesia Blocks and Procedures, Clinical Notes   Clinical Notes:  File: [id number]; File: [id number]

## 2014-11-25 NOTE — Interval H&P Note (Signed)
OK for surgery PD 

## 2014-11-25 NOTE — Anesthesia Procedure Notes (Addendum)
Spinal Patient location during procedure: OR Start time: 11/25/2014 7:34 AM End time: 11/25/2014 7:38 AM Staffing Performed by: anesthesiologist  Preanesthetic Checklist Completed: patient identified, site marked, surgical consent, pre-op evaluation, timeout performed, IV checked, risks and benefits discussed and monitors and equipment checked Spinal Block Patient position: sitting Prep: DuraPrep Patient monitoring: cardiac monitor, continuous pulse ox and blood pressure Approach: midline Location: L3-4 Injection technique: single-shot Needle Needle type: Pencan  Needle gauge: 25 G Needle length: 5 cm Assessment Sensory level: T10 Additional Notes Pt tolerated well   Anesthesia Regional Block:  Adductor canal block  Pre-Anesthetic Checklist: ,, timeout performed, Correct Patient, Correct Site, Correct Laterality, Correct Procedure, Correct Position, site marked, Risks and benefits discussed,  Surgical consent,  Pre-op evaluation,  At surgeon's request and post-op pain management  Laterality: Right and Lower  Prep: chloraprep       Needles:  Injection technique: Single-shot  Needle Type: Echogenic Needle     Needle Length: 9cm 9 cm Needle Gauge: 21 and 21 G    Additional Needles:  Procedures: ultrasound guided (picture in chart) Adductor canal block Narrative:  Start time: 11/25/2014 7:10 AM End time: 11/25/2014 7:15 AM Injection made incrementally with aspirations every 5 mL.  Performed by: Personally  Anesthesiologist: Jylan Loeza   Spinal Patient location during procedure: OR Start time: 11/25/2014 7:34 AM End time: 11/25/2014 7:39 AM Staffing Anesthesiologist: Abdurahman Rugg Performed by: anesthesiologist  Preanesthetic Checklist Completed: patient identified, site marked, surgical consent, pre-op evaluation, timeout performed, IV checked, risks and benefits discussed and monitors and equipment checked Spinal Block Patient position: sitting Prep:  DuraPrep Patient monitoring: cardiac monitor, continuous pulse ox and blood pressure Approach: midline Location: L3-4 Injection technique: single-shot Needle Needle type: Pencan  Needle gauge: 25 G Needle length: 5 cm Needle insertion depth: 3 cm Assessment Sensory level: T10 Additional Notes CSF was obtained in atraumatic fashion with good return in 4 quadrants. Spinal Marcaine, 1.8 ml of a 0.75% solution was given.  In the supine position, then, the pt obtained an appropriate level for the surgery.  Pt tolerated the procedure well.    [name] image for Right Adductor Canal block.

## 2014-11-25 NOTE — Evaluation (Signed)
Physical Therapy Evaluation Patient Details Name: Ryan Humphrey MRN: [id number] DOB: Sep 28, 1928 Today's Date: 11/25/2014   History of Present Illness  Pt is a 81 y/o M s/p R TKA.  Pt's PMH includes vertigo, HTN, neuropathy, anemia, depression, restless leg syndrome, osteopenia.  Clinical Impression  Pt is s/p R TKA resulting in the deficits listed below (see PT Problem List). Pt limited by lethargic state this session and required multiple reminders to keep his eyes open during exercises and ambulation.  Pt ambulated 20 ft in room w/ close chair follow and min assist to maintain balance.  Unless pt's functional independence improves prior to d/c pt will need to go to SNF.  Pt only has 1 person assist available at a time at home and requires +2 assist currently.  Pt will benefit from skilled PT to increase their independence and safety with mobility to allow discharge to the venue listed below.     Follow Up Recommendations SNF;Supervision for mobility/OOB    Equipment Recommendations  3in1 (PT)    Recommendations for Other Services       Precautions / Restrictions Precautions Precautions: Fall;Knee Precaution Booklet Issued: Yes (comment) Precaution Comments: Reviewed no pillow under knee Required Braces or Orthoses: Knee Immobilizer - Right Knee Immobilizer - Right: Other (comment) (not specified in order) Restrictions Weight Bearing Restrictions: Yes RLE Weight Bearing: Weight bearing as tolerated      Mobility  Bed Mobility Overal bed mobility: Needs Assistance;+2 for physical assistance Bed Mobility: Supine to Sit     Supine to sit: Min assist;+2 for physical assistance     General bed mobility comments: VCs for proper technique and min assist w/ RLE and trunk posteriorly.  Increased time.  Transfers Overall transfer level: Needs assistance Equipment used: Rolling walker (2 wheeled) Transfers: Sit to/from Stand Sit to Stand: From elevated surface;+2 physical  assistance;Min assist         General transfer comment: Min assist +2 to power up to standing.  Cues for hand placement.  Ambulation/Gait Ambulation/Gait assistance: Min assist;+2 safety/equipment Ambulation Distance (Feet): 20 Feet Assistive device: Rolling walker (2 wheeled) Gait Pattern/deviations: Step-to pattern;Decreased stride length;Decreased weight shift to right;Shuffle;Antalgic;Trunk flexed   Gait velocity interpretation: Below normal speed for age/gender General Gait Details: Min assist to maintain balance during ambulation.  Cues to ask pt to keep his eyes open.    Stairs            Wheelchair Mobility    Modified Rankin (Stroke Patients Only)       Balance Overall balance assessment: Needs assistance Sitting-balance support: Bilateral upper extremity supported;Feet supported Sitting balance-Leahy Scale: Poor     Standing balance support: Bilateral upper extremity supported;During functional activity Standing balance-Leahy Scale: Poor                               Pertinent Vitals/Pain Pain Assessment: 0-10 Pain Score: 10-Worst pain ever ("100/10") Pain Location: R knee Pain Descriptors / Indicators: Pounding;Guarding;Grimacing;Moaning Pain Intervention(s): Limited activity within patient's tolerance;Monitored during session;Repositioned    Home Living Family/patient expects to be discharged to:: Private residence Living Arrangements: Alone Available Help at Discharge: Family;Available 24 hours/day (daughter and grandson) Type of Home: House (Townhouse) Home Access: Level entry     Home Layout: One level Home Equipment: [place] - single point;Walker - 2 wheels      Prior Function Level of Independence: Independent with assistive device(s)  Comments: Was using SPC "when he remembered it".  Daughter said he used it when not at home.     Hand Dominance        Extremity/Trunk Assessment               Lower  Extremity Assessment: RLE deficits/detail RLE Deficits / Details: weakness and limited ROM as expected s/p R TKA    Cervical / Trunk Assessment: Kyphotic  Communication   Communication: No difficulties  Cognition Arousal/Alertness: Lethargic;Suspect due to medications Behavior During Therapy: Flat affect Overall Cognitive Status: Within Functional Limits for tasks assessed                      General Comments Unless pt's functional independence improves prior to d/c pt will need to go to SNF.  Pt only has 1 person assist available at a time and requires +2 for safety currently.    Exercises Total Joint Exercises Ankle Circles/Pumps: AROM;Both;10 reps;Supine Quad Sets: AROM;Both;5 reps;Supine Heel Slides: AAROM;Right;5 reps;Supine      Assessment/Plan    PT Assessment Patient needs continued PT services  PT Diagnosis Difficulty walking;Abnormality of gait;Acute pain;Generalized weakness   PT Problem List Decreased strength;Decreased range of motion;Decreased activity tolerance;Decreased balance;Decreased mobility;Decreased coordination;Decreased knowledge of use of DME;Decreased safety awareness;Decreased knowledge of precautions;Decreased skin integrity;Pain;Impaired sensation  PT Treatment Interventions DME instruction;Gait training;Stair training;Therapeutic activities;Functional mobility training;Therapeutic exercise;Balance training;Neuromuscular re-education;Patient/family education;Modalities   PT Goals (Current goals can be found in the Care Plan section) Acute Rehab PT Goals Patient Stated Goal: to feel better PT Goal Formulation: With patient/family Time For Goal Achievement: 12/02/14 Potential to Achieve Goals: Good    Frequency 7X/week   Barriers to discharge        Co-evaluation               End of Session Equipment Utilized During Treatment: Gait belt;Right knee immobilizer Activity Tolerance: Patient limited by lethargy Patient left: in  chair;with call bell/phone within reach;with family/visitor present Nurse Communication: Mobility status;Precautions;Weight bearing status         Time: 6945-0388 PT Time Calculation (min) (ACUTE ONLY): 34 min   Charges:   PT Evaluation $Initial PT Evaluation Tier I: 1 Procedure PT Treatments $Gait Training: 8-22 mins   PT G CodesJoslyn Hy PT, DPT [phone] Pager: [phone] 11/25/2014, 3:35 PM

## 2014-11-25 NOTE — Progress Notes (Signed)
Nurse informed Dr. Al Corpus that patient did not take Propranolol this morning. Dr. Al Corpus stated that Nurse did not need to give propranolol before surgery because patient takes it for tremors and not due to cardiac reasons.

## 2014-11-25 NOTE — Progress Notes (Signed)
PHARMACIST - PHYSICIAN COMMUNICATION  CONCERNING: P&T Medication Policy Regarding Oral Bisphosphonates  RECOMMENDATION: Your order for alendronate (Fosamax), ibandronate (Boniva), or risedronate (Actonel) has been discontinued at this time.  If the patient's post-hospital medical condition warrants safe use of this class of drugs, please resume the pre-hospital regimen upon discharge.  DESCRIPTION:  Alendronate (Fosamax), ibandronate (Boniva), and risedronate (Actonel) can cause severe esophageal erosions in patients who are unable to remain upright at least 30 minutes after taking this medication.   Since brief interruptions in therapy are thought to have minimal impact on bone mineral density, the [facility] has established that bisphosphonate orders should be routinely discontinued during hospitalization.   To override this safety policy and permit administration of Boniva, Fosamax, or Actonel in the hospital, prescribers must write "DO NOT HOLD" in the comments section when placing the order for this class of medications.   Salome Arnt, PharmD, BCPS Pager # [phone] 11/25/2014 11:21 AM

## 2014-11-25 NOTE — Anesthesia Preprocedure Evaluation (Addendum)
Anesthesia Evaluation  Patient identified by MRN, date of birth, ID band Patient awake    Reviewed: Allergy & Precautions, NPO status , Patient's Chart, lab work & pertinent test results  Airway Mallampati: I  TM Distance: >3 FB Neck ROM: Full    Dental  (+) Teeth Intact, Dental Advisory Given   Pulmonary former smoker,  breath sounds clear to auscultation        Cardiovascular + CAD Rhythm:Regular Rate:Normal     Neuro/Psych    GI/Hepatic hiatal hernia, GERD-  Controlled and Medicated,  Endo/Other    Renal/GU      Musculoskeletal   Abdominal   Peds  Hematology   Anesthesia Other Findings Non obstructive CAD  Reproductive/Obstetrics                            Anesthesia Physical Anesthesia Plan  ASA: III  Anesthesia Plan: Spinal and Regional   Post-op Pain Management:    Induction: Intravenous  Airway Management Planned: Simple Face Mask  Additional Equipment:   Intra-op Plan:   Post-operative Plan:   Informed Consent: I have reviewed the patients History and Physical, chart, labs and discussed the procedure including the risks, benefits and alternatives for the proposed anesthesia with the patient or authorized representative who has indicated his/her understanding and acceptance.   Dental advisory given  Plan Discussed with: CRNA, Anesthesiologist and Surgeon  Anesthesia Plan Comments:         Anesthesia Quick Evaluation

## 2014-11-25 NOTE — Transfer of Care (Signed)
Immediate Anesthesia Transfer of Care Note  Patient: Ryan Humphrey  Procedure(s) Performed: Procedure(s): RIGHT TOTAL KNEE ARTHROPLASTY (Right)  Patient Location: PACU  Anesthesia Type:Regional and Spinal  Level of Consciousness: awake, alert  and oriented  Airway & Oxygen Therapy: Patient Spontanous Breathing and Patient connected to nasal cannula oxygen  Post-op Assessment: Report given to RN and Post -op Vital signs reviewed and stable  Post vital signs: Reviewed and stable  Last Vitals:  Filed Vitals:   11/25/14 0635  BP: 168/74  Pulse: 72  Temp: 36.6 C  Resp: 20    Complications: No apparent anesthesia complications

## 2014-11-26 ENCOUNTER — Encounter (HOSPITAL_COMMUNITY): Payer: Self-pay | Admitting: Orthopaedic Surgery

## 2014-11-26 LAB — CBC
HEMATOCRIT: 29.4 % — AB (ref 39.0–52.0)
HEMOGLOBIN: 9.8 g/dL — AB (ref 13.0–17.0)
MCH: 31.6 pg (ref 26.0–34.0)
MCHC: 33.3 g/dL (ref 30.0–36.0)
MCV: 94.8 fL (ref 78.0–100.0)
Platelets: 117 10*3/uL — ABNORMAL LOW (ref 150–400)
RBC: 3.1 MIL/uL — ABNORMAL LOW (ref 4.22–5.81)
RDW: 13.7 % (ref 11.5–15.5)
WBC: 9.5 10*3/uL (ref 4.0–10.5)

## 2014-11-26 LAB — BASIC METABOLIC PANEL
ANION GAP: 6 (ref 5–15)
BUN: 17 mg/dL (ref 6–20)
CHLORIDE: 104 mmol/L (ref 101–111)
CO2: 28 mmol/L (ref 22–32)
Calcium: 8 mg/dL — ABNORMAL LOW (ref 8.9–10.3)
Creatinine, Ser: 1.4 mg/dL — ABNORMAL HIGH (ref 0.61–1.24)
GFR calc Af Amer: 52 mL/min — ABNORMAL LOW (ref >60)
GFR calc non Af Amer: 45 mL/min — ABNORMAL LOW (ref >60)
Glucose, Bld: 146 mg/dL — ABNORMAL HIGH (ref 65–99)
POTASSIUM: 4.2 mmol/L (ref 3.5–5.1)
Sodium: 138 mmol/L (ref 135–145)

## 2014-11-26 NOTE — Progress Notes (Signed)
Physical Therapy Treatment Patient Details Name: Ryan Humphrey MRN: [id number] DOB: [birth year] Today's Date: 11/26/2014    History of Present Illness Pt is a 81 y/o M s/p R TKA.  Pt's PMH includes vertigo, HTN, neuropathy, anemia, depression, restless leg syndrome, osteopenia.    PT Comments    Pt making modest progress w/ PT and ambulated 60 ft this session w/ close chair follow.  Pt continues to require +2 assist for mobility and will need d/c to SNF.  Discussed this plan w/ pt and pt's daughter who both agree w/ pt's d/c plan for SNF.  Pt will benefit from continued skilled PT services to increase functional independence and safety.   Follow Up Recommendations  Supervision for mobility/OOB;SNF     Equipment Recommendations  3in1 (PT)    Recommendations for Other Services       Precautions / Restrictions Precautions Precautions: Fall;Knee Precaution Comments: reviewed  Required Braces or Orthoses: Knee Immobilizer - Right Knee Immobilizer - Right: Other (comment) (not specified in order) Restrictions Weight Bearing Restrictions: Yes RLE Weight Bearing: Weight bearing as tolerated    Mobility  Bed Mobility               General bed mobility comments: in recliner  Transfers Overall transfer level: Needs assistance Equipment used: Rolling walker (2 wheeled) Transfers: Sit to/from Stand Sit to Stand: Mod assist         General transfer comment: Mod assist for balance and VCs and TCs for hand placement during sit<>stand  Ambulation/Gait Ambulation/Gait assistance: Min guard;+2 safety/equipment Ambulation Distance (Feet): 60 Feet Assistive device: Rolling walker (2 wheeled) Gait Pattern/deviations: Step-to pattern;Antalgic;Trunk flexed;Decreased stride length   Gait velocity interpretation: Below normal speed for age/gender General Gait Details: Cues to stand upright and to relax shoulders which pt was able to demonstrate but not maintain.  Pt w/ severe  FHP.     Stairs            Wheelchair Mobility    Modified Rankin (Stroke Patients Only)       Balance Overall balance assessment: Needs assistance Sitting-balance support: Bilateral upper extremity supported;Feet supported Sitting balance-Leahy Scale: Fair Sitting balance - Comments: BUE while sitting   Standing balance support: Bilateral upper extremity supported;During functional activity Standing balance-Leahy Scale: Poor Standing balance comment: rw and initial therapist assist for static standing balance                    Cognition Arousal/Alertness: Awake/alert Behavior During Therapy: WFL for tasks assessed/performed Overall Cognitive Status: History of cognitive impairments - at baseline (Pt's daughter reports baseline memory deficits)       Memory: Decreased short-term memory              Exercises Total Joint Exercises Quad Sets: AROM;5 reps;Seated;Both Short Arc Quad: AROM;Right;5 reps;Seated Long Arc Quad: AROM;Right;5 reps;Seated Knee Flexion: AROM;Right;5 reps;Seated Goniometric ROM: 16-88    General Comments General comments (skin integrity, edema, etc.): Spoke w/ pt and pt's daughter about d/c plan to SNF who both agreed on this d/c plan.      Pertinent Vitals/Pain Pain Assessment: Faces Pain Score: 10-Worst pain ever Faces Pain Scale: Hurts even more Pain Location: R knee Pain Descriptors / Indicators: Grimacing;Guarding;Moaning Pain Intervention(s): Limited activity within patient's tolerance;Monitored during session;Repositioned    Home Living Family/patient expects to be discharged to:: Skilled nursing facility Living Arrangements: Alone Available Help at Discharge: Family;Available 24 hours/day  Additional Comments: Pt lives alone and is currently +2 for functional mobility.    Prior Function Level of Independence: Independent with assistive device(s)      Comments: Was using SPC "when he remembered  it".  Daughter said he used it when not at home.   PT Goals (current goals can now be found in the care plan section) Acute Rehab PT Goals Patient Stated Goal: to feel better PT Goal Formulation: With patient/family Time For Goal Achievement: 12/02/14 Potential to Achieve Goals: Good Progress towards PT goals: Progressing toward goals    Frequency  7X/week    PT Plan Current plan remains appropriate    Co-evaluation             End of Session Equipment Utilized During Treatment: Gait belt;Right knee immobilizer Activity Tolerance: Patient limited by fatigue Patient left: in chair;with call bell/phone within reach;with family/visitor present     Time: 4235-3614 PT Time Calculation (min) (ACUTE ONLY): 34 min  Charges:  $Gait Training: 8-22 mins $Therapeutic Exercise: 8-22 mins                    G Codes:      Ryan Humphrey PT, DPT [phone] Pager: [phone] 11/26/2014, 3:32 PM

## 2014-11-26 NOTE — Progress Notes (Signed)
Subjective: 1 Day Post-Op Procedure(s) (LRB): RIGHT TOTAL KNEE ARTHROPLASTY (Right)   Patient resting comfortably in bed eating breakfast this morning. He states that he feels well and has no complaints. He has urinated himself twice but other than that he is doing great. He states that he has a history of prostate cancer and that he has had problems with controlling his urination in the past.  Activity level:  WBAT Diet tolerance:  ok Voiding:  ok Patient reports pain as mild.    Objective: Vital signs in last 24 hours: Temp:  [97 F (36.1 C)-98.2 F (36.8 C)] 97.9 F (36.6 C) (06/15 0540) Pulse Rate:  [64-86] 82 (06/15 0540) Resp:  [6-22] 14 (06/15 0540) BP: (100-143)/(54-76) 116/64 mmHg (06/15 0540) SpO2:  [92 %-100 %] 92 % (06/15 0540)  Labs:  Recent Labs  11/26/14 0554  HGB 9.8*    Recent Labs  11/26/14 0554  WBC 9.5  RBC 3.10*  HCT 29.4*  PLT 117*    Recent Labs  11/26/14 0554  NA 138  K 4.2  CL 104  CO2 28  BUN 17  CREATININE 1.40*  GLUCOSE 146*  CALCIUM 8.0*   No results for input(s): LABPT, INR in the last 72 hours.  Physical Exam:  Neurologically intact ABD soft Neurovascular intact Sensation intact distally Intact pulses distally Dorsiflexion/Plantar flexion intact Incision: dressing C/D/I and no drainage No cellulitis present Compartment soft  Assessment/Plan:  1 Day Post-Op Procedure(s) (LRB): RIGHT TOTAL KNEE ARTHROPLASTY (Right) Advance diet Up with therapy D/C IV fluids Plan for discharge tomorrow Discharge home with home health if doing well and cleared by PT. Continue on ASA 325mg  BID x 2 weeks post op. Follow up in office 2 weeks post op. Will change dressing to aquacel tomorrow prior to discharge.   Ryan Humphrey, [place]ance Sachs 11/26/2014, 7:46 AM

## 2014-11-26 NOTE — Clinical Social Work Note (Signed)
CSW received consult for possible SNF placement at time of discharge. Per MD office RNCM, patient is to discharge home with home health once medically clear.  CSW signing off. Please re consult if appropriate.  Lubertha Sayres, [place] Clinical Social Work Department Orthopedics [phone]) and Surgical [phone])

## 2014-11-26 NOTE — Plan of Care (Signed)
Problem: Consults Goal: Diagnosis- Total Joint Replacement Primary Total Knee Right     

## 2014-11-26 NOTE — Progress Notes (Addendum)
Physical Therapy Treatment Patient Details Name: Ryan Humphrey MRN: [id number] DOB: January 21, 1929 Today's Date: 11/26/2014    History of Present Illness Pt is a 81 y/o M s/p R TKA.  Pt's PMH includes vertigo, HTN, neuropathy, anemia, depression, restless leg syndrome, osteopenia.    PT Comments    Pt w/ improved mobility this session and demonstrated ability to ambulate 45 ft this session.  Pt will benefit from continued skilled PT services to increase functional independence and safety.  Pt requiring +2 assist and at this time still appropriate for d/c to SNF.   Follow Up Recommendations  Supervision for mobility/OOB;SNF     Equipment Recommendations  3in1 (PT)    Recommendations for Other Services       Precautions / Restrictions Precautions Precautions: Fall;Knee Precaution Comments: reviewed  Required Braces or Orthoses: Knee Immobilizer - Right Knee Immobilizer - Right: Other (comment) (not specified in order) Restrictions Weight Bearing Restrictions: Yes RLE Weight Bearing: Weight bearing as tolerated    Mobility  Bed Mobility               General bed mobility comments: in recliner  Transfers Overall transfer level: Needs assistance Equipment used: Rolling walker (2 wheeled) Transfers: Sit to/from Stand Sit to Stand: Mod assist         General transfer comment: LOB upon standing requiring mod assist to stabilize.  Cues for hand placement and to bring RLE under his body upon standing.  Ambulation/Gait Ambulation/Gait assistance: Min guard;+2 safety/equipment Ambulation Distance (Feet): 45 Feet Assistive device: Rolling walker (2 wheeled) Gait Pattern/deviations: Step-to pattern;Decreased stance time - right;Antalgic;Trunk flexed;Decreased weight shift to right   Gait velocity interpretation: Below normal speed for age/gender General Gait Details: Cues to stand upright and to relax shoulders which pt was able to demonstrate but not maintain.  Pt w/  severe FHP.     Stairs            Wheelchair Mobility    Modified Rankin (Stroke Patients Only)       Balance Overall balance assessment: Needs assistance Sitting-balance support: Bilateral upper extremity supported;Feet supported Sitting balance-Leahy Scale: Poor     Standing balance support: Bilateral upper extremity supported;During functional activity Standing balance-Leahy Scale: Poor                      Cognition Arousal/Alertness: Awake/alert Behavior During Therapy: WFL for tasks assessed/performed Overall Cognitive Status: No family/caregiver present to determine baseline cognitive functioning       Memory: Decreased short-term memory              Exercises Total Joint Exercises Quad Sets: AROM;5 reps;Seated;Both Short Arc Quad: AROM;Right;5 reps;Seated    General Comments        Pertinent Vitals/Pain Pain Assessment: 0-10 Pain Score: 10-Worst pain ever ("30/10") Pain Location: R knee Pain Descriptors / Indicators: Pounding;Aching;Grimacing Pain Intervention(s): Limited activity within patient's tolerance;Monitored during session;Repositioned    Home Living Family/patient expects to be discharged to:: Skilled nursing facility Living Arrangements: Alone Available Help at Discharge: Family;Available 24 hours/day                Prior Function            PT Goals (current goals can now be found in the care plan section) Acute Rehab PT Goals Patient Stated Goal: to feel better PT Goal Formulation: With patient Time For Goal Achievement: 12/02/14 Potential to Achieve Goals: Good Progress towards PT goals: Progressing toward goals  Frequency  7X/week    PT Plan Current plan remains appropriate    Co-evaluation             End of Session Equipment Utilized During Treatment: Gait belt;Right knee immobilizer Activity Tolerance: Patient limited by fatigue Patient left: in chair;with call bell/phone within  reach;Other (comment) (w/ OT in room)     Time: 1624-4695 PT Time Calculation (min) (ACUTE ONLY): 18 min  Charges:  $Gait Training: 8-22 mins                    G Codes:      Joslyn Hy PT, [place] [phone] Pager: [phone] 11/26/2014, 12:09 PM

## 2014-11-26 NOTE — Progress Notes (Signed)
Orthopedic Tech Progress Note Patient Details:  Ryan Humphrey April 16, 1929 [id number] On cpm at 7:15 pm Patient ID: Ryan Humphrey, male   DOB: 18-Dec-1928, 81 y.o.   MRN: [id number]   Braulio Bosch 11/26/2014, 7:16 PM

## 2014-11-26 NOTE — Progress Notes (Signed)
Occupational Therapy Evaluation Patient Details Name: Ryan Humphrey MRN: [id number] DOB: 1928/12/18 Today's Date: 11/26/2014    History of Present Illness Pt is a 81 y/o M s/p R TKA.  Pt's PMH includes vertigo, HTN, neuropathy, anemia, depression, restless leg syndrome, osteopenia.   Clinical Impression   Pt admitted with the above diagnoses and presents with below problem list. Pt will benefit from continued OT to address the below listed deficits and maximize independence with BADLs prior to d/c to venue below. PTA pt was mod I with ADLs. Pt is currently mod A +2 for safety/equipment for functional mobility. Pt had one LOB ambulating to bathroom requiring rw and therapist assist to control balance. Recommend SNF at d/c to help facilitate pt's return to a level of assist that family can manage at home. OT to continue to follow acutely.      Follow Up Recommendations  SNF    Equipment Recommendations  Other (comment) (defer to next venue, will likely need 3n1)    Recommendations for Other Services       Precautions / Restrictions Precautions Precautions: Fall;Knee Precaution Comments: reviewed  Required Braces or Orthoses: Knee Immobilizer - Right Knee Immobilizer - Right: Other (comment) (not specified in order) Restrictions Weight Bearing Restrictions: Yes RLE Weight Bearing: Weight bearing as tolerated      Mobility Bed Mobility               General bed mobility comments: in recliner  Transfers Overall transfer level: Needs assistance Equipment used: Rolling walker (2 wheeled) Transfers: Sit to/from Stand Sit to Stand: Mod assist         General transfer comment: From recliner and 3n1. Assist for balance during transitional movements. Cues for technique with rw.     Balance Overall balance assessment: Needs assistance Sitting-balance support: Bilateral upper extremity supported;Feet supported Sitting balance-Leahy Scale: Poor Sitting balance -  Comments: BUE while sitting   Standing balance support: Bilateral upper extremity supported;During functional activity Standing balance-Leahy Scale: Poor Standing balance comment: rw and initial therapist assist for static standing balance                            ADL Overall ADL's : Needs assistance/impaired Eating/Feeding: Set up;Sitting   Grooming: Set up;Sitting   Upper Body Bathing: Set up;Sitting   Lower Body Bathing: +2 for safety/equipment;Sit to/from stand;Moderate assistance   Upper Body Dressing : Set up;Sitting   Lower Body Dressing: Moderate assistance;+2 for safety/equipment;Sit to/from stand   Toilet Transfer: +2 for safety/equipment;Moderate assistance;Ambulation;BSC;RW   Toileting- Clothing Manipulation and Hygiene: Moderate assistance;+2 for safety/equipment;Sit to/from stand   Tub/ Banker: Moderate assistance;+2 for safety/equipment;3 in 1;Rolling walker;Ambulation   Functional mobility during ADLs: Moderate assistance;Rolling walker General ADL Comments: Pt ambulated to bathroom with close chair follow at mod A level due to one LOB posteriorly requiring rw and therapist assist to regain balance. Pt with decreased dynamic standing balance impacting level of assist with LB ADLs, functional mobility, and transfers.      Vision     Perception     Praxis      Pertinent Vitals/Pain Pain Assessment: 0-10 Pain Score: 10-Worst pain ever Pain Location: Rt knee Pain Descriptors / Indicators: Aching;Grimacing;Pounding Pain Intervention(s): Limited activity within patient's tolerance;Monitored during session;Repositioned     Hand Dominance     Extremity/Trunk Assessment Upper Extremity Assessment Upper Extremity Assessment: Overall WFL for tasks assessed   Lower Extremity Assessment Lower Extremity  Assessment: Defer to PT evaluation   Cervical / Trunk Assessment Cervical / Trunk Assessment: Kyphotic   Communication  Communication Communication: No difficulties   Cognition Arousal/Alertness: Awake/alert Behavior During Therapy: WFL for tasks assessed/performed Overall Cognitive Status: No family/caregiver present to determine baseline cognitive functioning       Memory: Decreased short-term memory             General Comments       Exercises     Shoulder Instructions      Home Living Family/patient expects to be discharged to:: Skilled nursing facility Living Arrangements: Alone Available Help at Discharge: Family;Available 24 hours/day                             Additional Comments: Pt lives alone and is currently +2 for functional mobility.      Prior Functioning/Environment Level of Independence: Independent with assistive device(s)        Comments: Was using SPC "when he remembered it".  Daughter said he used it when not at home.    OT Diagnosis: Acute pain   OT Problem List: Impaired balance (sitting and/or standing);Decreased knowledge of use of DME or AE;Decreased knowledge of precautions;Pain   OT Treatment/Interventions: Self-care/ADL training;Energy conservation;DME and/or AE instruction;Therapeutic activities;Patient/family education;Balance training    OT Goals(Current goals can be found in the care plan section) Acute Rehab OT Goals Patient Stated Goal: to feel better OT Goal Formulation: With patient Time For Goal Achievement: 12/03/14 Potential to Achieve Goals: Good ADL Goals Pt Will Perform Lower Body Bathing: with min guard assist;with adaptive equipment;sit to/from stand Pt Will Perform Lower Body Dressing: with min guard assist;with adaptive equipment;sit to/from stand Pt Will Transfer to Toilet: with min assist;ambulating (3n1 over toilet, +1 assist) Pt Will Perform Toileting - Clothing Manipulation and hygiene: with min assist;sit to/from stand Pt Will Perform Tub/Shower Transfer: with min assist;ambulating;3 in 1;rolling walker  OT  Frequency: Min 2X/week   Barriers to D/C: Decreased caregiver support  currently at +2 assist level for functional mobility       Co-evaluation              End of Session Equipment Utilized During Treatment: Gait belt;Rolling walker;Right knee immobilizer CPM Right Knee Additional Comments: rolled towel under rt heel  Activity Tolerance: Patient limited by pain;Patient tolerated treatment well Patient left: in chair;with call bell/phone within reach;with nursing/sitter in room   Time: 1141-1202 OT Time Calculation (min): 21 min Charges:  OT General Charges $OT Visit: 1 Procedure OT Evaluation $Initial OT Evaluation Tier I: 1 Procedure G-Codes:    Hortencia Pilar 17-Dec-2014, 12:23 PM

## 2014-11-27 ENCOUNTER — Inpatient Hospital Stay (HOSPITAL_COMMUNITY): Payer: Medicare Other

## 2014-11-27 DIAGNOSIS — R278 Other lack of coordination: Secondary | ICD-10-CM | POA: Diagnosis not present

## 2014-11-27 DIAGNOSIS — D62 Acute posthemorrhagic anemia: Secondary | ICD-10-CM | POA: Diagnosis not present

## 2014-11-27 DIAGNOSIS — S299XXA Unspecified injury of thorax, initial encounter: Secondary | ICD-10-CM | POA: Diagnosis not present

## 2014-11-27 DIAGNOSIS — R0781 Pleurodynia: Secondary | ICD-10-CM | POA: Diagnosis not present

## 2014-11-27 DIAGNOSIS — I10 Essential (primary) hypertension: Secondary | ICD-10-CM | POA: Diagnosis not present

## 2014-11-27 DIAGNOSIS — R441 Visual hallucinations: Secondary | ICD-10-CM | POA: Diagnosis not present

## 2014-11-27 DIAGNOSIS — S80821S Blister (nonthermal), right lower leg, sequela: Secondary | ICD-10-CM | POA: Diagnosis not present

## 2014-11-27 DIAGNOSIS — R262 Difficulty in walking, not elsewhere classified: Secondary | ICD-10-CM | POA: Diagnosis not present

## 2014-11-27 DIAGNOSIS — S8991XA Unspecified injury of right lower leg, initial encounter: Secondary | ICD-10-CM | POA: Diagnosis not present

## 2014-11-27 DIAGNOSIS — D519 Vitamin B12 deficiency anemia, unspecified: Secondary | ICD-10-CM | POA: Diagnosis not present

## 2014-11-27 DIAGNOSIS — M810 Age-related osteoporosis without current pathological fracture: Secondary | ICD-10-CM | POA: Diagnosis not present

## 2014-11-27 DIAGNOSIS — F329 Major depressive disorder, single episode, unspecified: Secondary | ICD-10-CM | POA: Diagnosis not present

## 2014-11-27 DIAGNOSIS — N179 Acute kidney failure, unspecified: Secondary | ICD-10-CM | POA: Diagnosis not present

## 2014-11-27 DIAGNOSIS — G629 Polyneuropathy, unspecified: Secondary | ICD-10-CM | POA: Diagnosis not present

## 2014-11-27 DIAGNOSIS — M6281 Muscle weakness (generalized): Secondary | ICD-10-CM | POA: Diagnosis not present

## 2014-11-27 DIAGNOSIS — M1711 Unilateral primary osteoarthritis, right knee: Secondary | ICD-10-CM | POA: Diagnosis not present

## 2014-11-27 DIAGNOSIS — N3000 Acute cystitis without hematuria: Secondary | ICD-10-CM | POA: Diagnosis not present

## 2014-11-27 DIAGNOSIS — I2510 Atherosclerotic heart disease of native coronary artery without angina pectoris: Secondary | ICD-10-CM | POA: Diagnosis not present

## 2014-11-27 DIAGNOSIS — Z96651 Presence of right artificial knee joint: Secondary | ICD-10-CM | POA: Diagnosis not present

## 2014-11-27 DIAGNOSIS — M25561 Pain in right knee: Secondary | ICD-10-CM | POA: Diagnosis not present

## 2014-11-27 DIAGNOSIS — E785 Hyperlipidemia, unspecified: Secondary | ICD-10-CM | POA: Diagnosis not present

## 2014-11-27 DIAGNOSIS — Z471 Aftercare following joint replacement surgery: Secondary | ICD-10-CM | POA: Diagnosis not present

## 2014-11-27 DIAGNOSIS — K5900 Constipation, unspecified: Secondary | ICD-10-CM | POA: Diagnosis not present

## 2014-11-27 LAB — CBC
HEMATOCRIT: 24.1 % — AB (ref 39.0–52.0)
HEMOGLOBIN: 8.1 g/dL — AB (ref 13.0–17.0)
MCH: 31.9 pg (ref 26.0–34.0)
MCHC: 33.6 g/dL (ref 30.0–36.0)
MCV: 94.9 fL (ref 78.0–100.0)
PLATELETS: 95 10*3/uL — AB (ref 150–400)
RBC: 2.54 MIL/uL — AB (ref 4.22–5.81)
RDW: 13.5 % (ref 11.5–15.5)
WBC: 8.6 10*3/uL (ref 4.0–10.5)

## 2014-11-27 LAB — URINALYSIS, ROUTINE W REFLEX MICROSCOPIC
Bilirubin Urine: NEGATIVE
Glucose, UA: NEGATIVE mg/dL
Hgb urine dipstick: NEGATIVE
Ketones, ur: NEGATIVE mg/dL
Leukocytes, UA: NEGATIVE
Nitrite: NEGATIVE
PROTEIN: NEGATIVE mg/dL
Specific Gravity, Urine: 1.014 (ref 1.005–1.030)
UROBILINOGEN UA: 1 mg/dL (ref 0.0–1.0)
pH: 5.5 (ref 5.0–8.0)

## 2014-11-27 MED ORDER — METHOCARBAMOL 500 MG PO TABS: 500.0000 mg | ORAL_TABLET | Freq: Four times a day (QID) | ORAL | Status: DC | PRN

## 2014-11-27 MED ORDER — HYDROCODONE-ACETAMINOPHEN 5-325 MG PO TABS: 1.0000 | ORAL_TABLET | ORAL | Status: DC | PRN

## 2014-11-27 MED ORDER — ASPIRIN 325 MG PO TBEC: 325.0000 mg | DELAYED_RELEASE_TABLET | Freq: Two times a day (BID) | ORAL | Status: DC

## 2014-11-27 NOTE — Clinical Social Work Note (Signed)
Clinical Social Work Assessment  Patient Details  Name: Ryan Humphrey MRN: [id number] Date of Birth: 1928-10-14  Date of referral:  11/27/14               Reason for consult:  Facility Placement, Discharge Planning                Permission sought to share information with:  Facility Art therapist granted to share information::  Yes, Verbal Permission Granted  Name::     n/a  Agency::  [facility]  Relationship::  n/a  Contact Information:  [phone]  Housing/Transportation Living arrangements for the past 2 months:  [place] of Information:  Patient Patient Interpreter Needed:  None Criminal Activity/Legal Involvement Pertinent to Current Situation/Hospitalization:  No - Comment as needed Significant Relationships:  Adult Children Lives with:  Self Do you feel safe going back to the place where you live?  No (High fall risk.) Need for family participation in patient care:  Yes (Comment) (Patient's daughter active in patient's care.)  Care giving concerns:  Patient nor patient's daughter expressed concerns at this time.   Social Worker assessment / plan:  Bundle patient. CSW received consult from MD office RNCM, patient to be [facility] once medically stable for discharge. CSW met with patient and patient's daughter to confirm discharge disposition. Patient to be discharged via EMS.  Employment status:  Retired Forensic scientist:  Medicare PT Recommendations:  [place] / Referral to community resources:  [place]  Patient/Family's Response to care:  Patient understanding and agreeable to CSW plan of care.  Patient/Family's Understanding of and Emotional Response to Diagnosis, Current Treatment, and Prognosis:  Patient understanding and agreeable to CSW plan of care.  Emotional Assessment Appearance:  Appears younger than stated age Attitude/Demeanor/Rapport:  Other  (Pleasant.) Affect (typically observed):  Pleasant, Appropriate, Accepting Orientation:  Oriented to Self, Oriented to Place, Oriented to  Time, Oriented to Situation Alcohol / Substance use:  Not Applicable Psych involvement (Current and /or in the community):  No (Comment) (Not appropriate on this admission.)  Discharge Needs  Concerns to be addressed:  No discharge needs identified Readmission within the last 30 days:  No Current discharge risk:  None Barriers to Discharge:  No Barriers Identified   Caroline Sauger, LCSW 11/27/2014, 2:12 PM [phone]

## 2014-11-27 NOTE — Progress Notes (Signed)
Report called to [facility] 

## 2014-11-27 NOTE — Progress Notes (Signed)
Physical Therapy Treatment Patient Details Name: Ryan Humphrey MRN: [id number] DOB: May 27, 1929 Today's Date: 11/27/2014    History of Present Illness Pt is a 81 y/o M s/p R TKA.  Pt's PMH includes vertigo, HTN, neuropathy, anemia, depression, restless leg syndrome, osteopenia.    PT Comments    Pt limited by pain this session w/ concern for possible R rib fx 2/2 fall last night when pt got OOB w/o assist.  Required +2 assist for bed mobility and performed exercises sitting EOB this session.  Pt will benefit from continued skilled PT services to increase functional independence and safety.   Follow Up Recommendations  Supervision for mobility/OOB;SNF      Equipment Recommendations  3in1 (PT)    Recommendations for Other Services       Precautions / Restrictions Precautions Precautions: Fall;Knee Precaution Comments: reviewed  Required Braces or Orthoses: Knee Immobilizer - Right Knee Immobilizer - Right: Other (comment) (not specified in order) Restrictions Weight Bearing Restrictions: Yes RLE Weight Bearing: Weight bearing as tolerated    Mobility  Bed Mobility Overal bed mobility: Needs Assistance;+2 for physical assistance Bed Mobility: Supine to Sit;Sit to Supine     Supine to sit: Min assist;+2 for physical assistance Sit to supine: Min assist;+2 for physical assistance   General bed mobility comments: +2 in assist during supine<>sit w/ trunk posteriorly and BLEs  Transfers                 General transfer comment: Did not attempt sit<>stand or ambulation this session 2/2 concern for possible rib fx  Ambulation/Gait                 Stairs            Wheelchair Mobility    Modified Rankin (Stroke Patients Only)       Balance Overall balance assessment: Needs assistance Sitting-balance support: Bilateral upper extremity supported;Feet supported Sitting balance-Leahy Scale: Fair                              Cognition  Arousal/Alertness: Awake/alert Behavior During Therapy: WFL for tasks assessed/performed Overall Cognitive Status: History of cognitive impairments - at baseline       Memory: Decreased short-term memory              Exercises Total Joint Exercises Ankle Circles/Pumps: AROM;Both;10 reps;Supine Long Arc Quad: AROM;Right;5 reps;Seated Knee Flexion: AROM;Right;5 reps;Seated Goniometric ROM: 89 deg R knee flexion    General Comments General comments (skin integrity, edema, etc.): Pt had fall last night in room after getting OOB w/o assist.      Pertinent Vitals/Pain Pain Assessment: Faces Faces Pain Scale: Hurts even more Pain Location: RLQ and R knee Pain Descriptors / Indicators: Aching;Sharp Pain Intervention(s): Limited activity within patient's tolerance;Monitored during session;Repositioned    Home Living                      Prior Function            PT Goals (current goals can now be found in the care plan section) Acute Rehab PT Goals Patient Stated Goal: to feel better PT Goal Formulation: With patient/family Time For Goal Achievement: 12/02/14 Potential to Achieve Goals: Good Progress towards PT goals: Progressing toward goals    Frequency  7X/week    PT Plan Current plan remains appropriate    Co-evaluation  End of Session   Activity Tolerance: Patient limited by pain Patient left: in bed;with call bell/phone within reach;with family/visitor present;with bed alarm set     Time: 1426-1440 PT Time Calculation (min) (ACUTE ONLY): 14 min  Charges:  $Therapeutic Exercise: 8-22 mins                    G Codes:      Joslyn Hy PT, [place] [phone] Pager: [phone] 11/27/2014, 4:49 PM

## 2014-11-27 NOTE — Discharge Summary (Signed)
Patient ID: Ryan Humphrey MRN: [id number] DOB/AGE: 1929/04/18 81 y.o.  Admit date: 11/25/2014 Discharge date: 11/27/2014  Admission Diagnoses:  Principal Problem:   Primary osteoarthritis of right knee   Discharge Diagnoses:  Same  Past Medical History  Diagnosis Date  . Neuropathy   . Hyperlipidemia   . Arthritis     arthritis,osteopenia,"spinal stenosis"  . GERD (gastroesophageal reflux disease)   . H/O hiatal hernia   . H/O vertigo 12-06-12    none recent  . Anemia   . Depression   . Cancer 12-06-12    Prostate cancer'98  . Peripheral neuropathy 12-06-12    peripheral neuropathy  . RLS (restless legs syndrome)   . Rhinitis   . CAD (coronary artery disease)   . Osteopenia   . Vitamin B12 deficiency   . IBS (irritable bowel syndrome)     Surgeries: Procedure(s): RIGHT TOTAL KNEE ARTHROPLASTY on 11/25/2014   Consultants:    Discharged Condition: Improved  Hospital Course: Ryan Humphrey is an 80 y.o. male who was admitted 11/25/2014 for operative treatment ofPrimary osteoarthritis of right knee. Patient has severe unremitting pain that affects sleep, daily activities, and work/hobbies. After pre-op clearance the patient was taken to the operating room on 11/25/2014 and underwent  Procedure(s): RIGHT TOTAL KNEE ARTHROPLASTY.    Patient was given perioperative antibiotics: Anti-infectives    Start     Dose/Rate Route Frequency Ordered Stop   11/25/14 1930  vancomycin (VANCOCIN) IVPB 1000 mg/200 mL premix     1,000 mg 200 mL/hr over 60 Minutes Intravenous Every 12 hours 11/25/14 1118 11/25/14 2141   11/25/14 0623  vancomycin (VANCOCIN) IVPB 1000 mg/200 mL premix     1,000 mg 200 mL/hr over 60 Minutes Intravenous On call to O.R. 11/25/14 4098 11/25/14 1191       Patient was given sequential compression devices, early ambulation, and chemoprophylaxis to prevent DVT.  Patient benefited maximally from hospital stay and there were no complications.    Recent vital  signs: Patient Vitals for the past 24 hrs:  BP Temp Temp src Pulse Resp SpO2 Height Weight  11/27/14 0448 (!) 97/39 mmHg 97.7 F (36.5 C) - 73 18 99 % - -  11/27/14 0245 - - - - - 100 % - -  11/27/14 0242 92/65 mmHg 98 F (36.7 C) - 100 20 (!) 83 % - -  11/26/14 2326 - - - - - - 5\' 11"  (1.803 m) 81.647 kg (180 lb)  11/26/14 1959 (!) 110/47 mmHg 99.1 F (37.3 C) - 85 18 95 % - -  11/26/14 1300 (!) 117/48 mmHg 98.1 F (36.7 C) Oral 82 18 99 % - -  11/26/14 1100 (!) 131/51 mmHg - - 82 - 100 % - -     Recent laboratory studies:  Recent Labs  11/26/14 0554 11/27/14 0633  WBC 9.5 8.6  HGB 9.8* 8.1*  HCT 29.4* 24.1*  PLT 117* 95*  NA 138  --   K 4.2  --   CL 104  --   CO2 28  --   BUN 17  --   CREATININE 1.40*  --   GLUCOSE 146*  --   CALCIUM 8.0*  --      Discharge Medications:     Medication List    TAKE these medications        alendronate 70 MG tablet  Commonly known as:  FOSAMAX  Take 70 mg by mouth every Saturday. Take with a full glass  of water on an empty stomach.     aspirin 325 MG EC tablet  Take 1 tablet (325 mg total) by mouth 2 (two) times daily after a meal.     CALTRATE 600+D PO  Take 1 tablet by mouth 2 (two) times daily.     cyanocobalamin 1000 MCG/ML injection  Commonly known as:  (VITAMIN B-12)  Inject 1,000 mcg into the muscle every 30 (thirty) days.     diphenoxylate-atropine 2.5-0.025 MG per tablet  Commonly known as:  LOMOTIL  Take 1 tablet by mouth 4 (four) times daily as needed for diarrhea or loose stools.     gabapentin 300 MG capsule  Commonly known as:  NEURONTIN  Take 300 mg by mouth 4 (four) times daily.     HYDROcodone-acetaminophen 5-325 MG per tablet  Commonly known as:  NORCO/VICODIN  Take 1-2 tablets by mouth every 4 (four) hours as needed (breakthrough pain).     methocarbamol 500 MG tablet  Commonly known as:  ROBAXIN  Take 1 tablet (500 mg total) by mouth every 6 (six) hours as needed for muscle spasms.      propranolol 20 MG tablet  Commonly known as:  INDERAL  Take 1 tablet (20 mg total) by mouth 2 (two) times daily.     sertraline 100 MG tablet  Commonly known as:  ZOLOFT  Take 100 mg by mouth daily.     simvastatin 20 MG tablet  Commonly known as:  ZOCOR  Take 20 mg by mouth every evening.     Vitamin D 2000 UNITS tablet  Take 2,000 Units by mouth daily.     VITAMIN E PO  Take 1 tablet by mouth daily.        Diagnostic Studies: Dg Chest 2 View  11/13/2014   CLINICAL DATA:  81 year old male preoperative study for knee surgery. Initial encounter.  EXAM: CHEST  2 VIEW  COMPARISON:  05/20/2014 and earlier.  FINDINGS: Improved lung volumes compared to 2015. Stable cardiac and mediastinal contours with mild chronic tortuosity of the descending thoracic aorta. Stable mild increased interstitial markings since 2010. No pneumothorax, pulmonary edema, pleural effusion or acute pulmonary opacity. Chronic degenerative endplate changes in the thoracic spine. No acute osseous abnormality identified.  IMPRESSION: No acute cardiopulmonary abnormality.   Electronically Signed   By: Genevie Ann M.D.   On: 11/13/2014 15:19   Dg Knee Right Port  11/27/2014   CLINICAL DATA:  Right knee replacement, fell last night  EXAM: PORTABLE RIGHT KNEE - 1-2 VIEW  COMPARISON:  None.  FINDINGS: The femoral and tibial components of the right total knee replacement appear to be in good position and alignment. No acute fracture is seen. No definite joint fluid is noted.  IMPRESSION: No acute abnormality. Right total knee replacement components in good position.   Electronically Signed   By: Ivar Drape M.D.   On: 11/27/2014 08:21    Disposition: 01-Home or Self Care      Discharge Instructions    Call MD / Call 911    Complete by:  As directed   If you experience chest pain or shortness of breath, CALL 911 and be transported to the hospital emergency room.  If you develope a fever above 101 F, pus (white drainage) or  increased drainage or redness at the wound, or calf pain, call your surgeon's office.     Constipation Prevention    Complete by:  As directed   Drink plenty of fluids.  Prune juice may be helpful.  You may use a stool softener, such as Colace (over the counter) 100 mg twice a day.  Use MiraLax (over the counter) for constipation as needed.     Diet - low sodium heart healthy    Complete by:  As directed      Discharge instructions    Complete by:  As directed   INSTRUCTIONS AFTER JOINT REPLACEMENT   Remove items at home which could result in a fall. This includes throw rugs or furniture in walking pathways ICE to the affected joint every three hours while awake for 30 minutes at a time, for at least the first 3-5 days, and then as needed for pain and swelling.  Continue to use ice for pain and swelling. You may notice swelling that will progress down to the foot and ankle.  This is normal after surgery.  Elevate your leg when you are not up walking on it.   Continue to use the breathing machine you got in the hospital (incentive spirometer) which will help keep your temperature down.  It is common for your temperature to cycle up and down following surgery, especially at night when you are not up moving around and exerting yourself.  The breathing machine keeps your lungs expanded and your temperature down.   DIET:  As you were doing prior to hospitalization, we recommend a well-balanced diet.  DRESSING / WOUND CARE / SHOWERING  You may shower 3 days after surgery, but keep the wounds dry during showering.  You may use an occlusive plastic wrap (Press'n Seal for example), NO SOAKING/SUBMERGING IN THE BATHTUB.  If the bandage gets wet, change with a clean dry gauze.  If the incision gets wet, pat the wound dry with a clean towel.  ACTIVITY  Increase activity slowly as tolerated, but follow the weight bearing instructions below.   No driving for 6 weeks or until further direction given by your  physician.  You cannot drive while taking narcotics.  No lifting or carrying greater than 10 lbs. until further directed by your surgeon. Avoid periods of inactivity such as sitting longer than an hour when not asleep. This helps prevent blood clots.  You may return to work once you are authorized by your doctor.     WEIGHT BEARING   Weight bearing as tolerated with assist device (walker, cane, etc) as directed, use it as long as suggested by your surgeon or therapist, typically at least 4-6 weeks.   EXERCISES  Results after joint replacement surgery are often greatly improved when you follow the exercise, range of motion and muscle strengthening exercises prescribed by your doctor. Safety measures are also important to protect the joint from further injury. Any time any of these exercises cause you to have increased pain or swelling, decrease what you are doing until you are comfortable again and then slowly increase them. If you have problems or questions, call your caregiver or physical therapist for advice.   Rehabilitation is important following a joint replacement. After just a few days of immobilization, the muscles of the leg can become weakened and shrink (atrophy).  These exercises are designed to build up the tone and strength of the thigh and leg muscles and to improve motion. Often times heat used for twenty to thirty minutes before working out will loosen up your tissues and help with improving the range of motion but do not use heat for the first two weeks following surgery (sometimes heat can  increase post-operative swelling).   These exercises can be done on a training (exercise) mat, on the floor, on a table or on a bed. Use whatever works the best and is most comfortable for you.    Use music or television while you are exercising so that the exercises are a pleasant break in your day. This will make your life better with the exercises acting as a break in your routine that you  can look forward to.   Perform all exercises about fifteen times, three times per day or as directed.  You should exercise both the operative leg and the other leg as well.   Exercises include:   Quad Sets - Tighten up the muscle on the front of the thigh (Quad) and hold for 5-10 seconds.   Straight Leg Raises - With your knee straight (if you were given a brace, keep it on), lift the leg to 60 degrees, hold for 3 seconds, and slowly lower the leg.  Perform this exercise against resistance later as your leg gets stronger.  Leg Slides: Lying on your back, slowly slide your foot toward your buttocks, bending your knee up off the floor (only go as far as is comfortable). Then slowly slide your foot back down until your leg is flat on the floor again.  Angel Wings: Lying on your back spread your legs to the side as far apart as you can without causing discomfort.  Hamstring Strength:  Lying on your back, push your heel against the floor with your leg straight by tightening up the muscles of your buttocks.  Repeat, but this time bend your knee to a comfortable angle, and push your heel against the floor.  You may put a pillow under the heel to make it more comfortable if necessary.   A rehabilitation program following joint replacement surgery can speed recovery and prevent re-injury in the future due to weakened muscles. Contact your doctor or a physical therapist for more information on knee rehabilitation.    CONSTIPATION  Constipation is defined medically as fewer than three stools per week and severe constipation as less than one stool per week.  Even if you have a regular bowel pattern at home, your normal regimen is likely to be disrupted due to multiple reasons following surgery.  Combination of anesthesia, postoperative narcotics, change in appetite and fluid intake all can affect your bowels.   YOU MUST use at least one of the following options; they are listed in order of increasing strength  to get the job done.  They are all available over the counter, and you may need to use some, POSSIBLY even all of these options:    Drink plenty of fluids (prune juice may be helpful) and high fiber foods Colace 100 mg by mouth twice a day  Senokot for constipation as directed and as needed Dulcolax (bisacodyl), take with full glass of water  Miralax (polyethylene glycol) once or twice a day as needed.  If you have tried all these things and are unable to have a bowel movement in the first 3-4 days after surgery call either your surgeon or your primary doctor.    If you experience loose stools or diarrhea, hold the medications until you stool forms back up.  If your symptoms do not get better within 1 week or if they get worse, check with your doctor.  If you experience "the worst abdominal pain ever" or develop nausea or vomiting, please contact the office immediately for  further recommendations for treatment.   ITCHING:  If you experience itching with your medications, try taking only a single pain pill, or even half a pain pill at a time.  You can also use Benadryl over the counter for itching or also to help with sleep.   TED HOSE STOCKINGS:  Use stockings on both legs until for at least 2 weeks or as directed by physician office. They may be removed at night for sleeping.  MEDICATIONS:  See your medication summary on the "After Visit Summary" that nursing will review with you.  You may have some home medications which will be placed on hold until you complete the course of blood thinner medication.  It is important for you to complete the blood thinner medication as prescribed.  PRECAUTIONS:  If you experience chest pain or shortness of breath - call 911 immediately for transfer to the hospital emergency department.   If you develop a fever greater that 101 F, purulent drainage from wound, increased redness or drainage from wound, foul odor from the wound/dressing, or calf pain - CONTACT  YOUR SURGEON.                                                   FOLLOW-UP APPOINTMENTS:  If you do not already have a post-op appointment, please call the office for an appointment to be seen by your surgeon.  Guidelines for how soon to be seen are listed in your "After Visit Summary", but are typically between 1-4 weeks after surgery.  OTHER INSTRUCTIONS:   Knee Replacement:  Do not place pillow under knee, focus on keeping the knee straight while resting. CPM instructions: 0-90 degrees, 2 hours in the morning, 2 hours in the afternoon, and 2 hours in the evening. Place foam block, curve side up under heel at all times except when in CPM or when walking.  DO NOT modify, tear, cut, or change the foam block in any way.  MAKE SURE YOU:  Understand these instructions.  Get help right away if you are not doing well or get worse.    Thank you for letting us be a part of your medical care team.  It is a privilege we respect greatly.  We hope these instructions will help you stay on track for a fast and full recovery!     Increase activity slowly as tolerated    Complete by:  As directed            Follow-up Information    Follow up with Hessie Dibble, MD. Schedule an appointment as soon as possible for a visit in 2 weeks.   Specialty:  Orthopedic Surgery   Contact information:   [place] [address] [phone]        Signed: Rich Fuchs 11/27/2014, 9:50 AM

## 2014-11-27 NOTE — Progress Notes (Signed)
   11/27/14 0242  What Happened  Was fall witnessed? No  Was patient injured? Yes  Patient found on floor  Found by Staff-comment Leia Alf RN)  Stated prior activity bathroom-unassisted  Follow Up  MD notified PA Loni Dolly  Time MD notified [phone]  Family notified Yes-comment (daughter at bedside)  Time family notified 71  Additional tests Yes-comment (2 view portable xray of R knee per Loni Dolly PA)  Simple treatment Dressing (R lateral back abrasion; cleansed & pink foam x2 applied)  Adult Fall Risk Assessment  Risk Factor Category (scoring not indicated) Fall has occurred during this admission (document High fall risk)  Patient's Fall Risk High Fall Risk (>13 points)  Adult Fall Risk Interventions  Required Bundle Interventions *See Row Information* High fall risk - low, moderate, and high requirements implemented  Additional Interventions Fall risk signage;Individualized elimination schedule;Pharmacy review of medications;PT/OT need assessed if change in mobility from baseline;Secure all tubes/drains;Use of appropriate toileting equipment (bedpan, BSC, etc.);Other (Comment) (per daughter request; all four side rails are up)  Fall with Injury Screening  Risk For Fall Injury- See Row Information  Nurse judgement;E  Intervention(s) for 2 or more risk criteria identified Floor Mat  Vitals  Temp 98 F (36.7 C)  BP 92/65 mmHg  BP Location Left Arm  BP Method Automatic  Patient Position (if appropriate) Sitting  Pulse Rate 100  Resp 20  Oxygen Therapy  SpO2 (!) 83 % (2L o2 [place] applied, pts o2 sats now 100%)  O2 Device Room Air  Pain Assessment  Pain Assessment 0-10  Pain Score 0  PCA/Epidural/Spinal Assessment  Respiratory Pattern Regular;Unlabored  Neurological  Neuro (WDL) WDL (pt able to stand and transfer to bed without dizziness)  Level of Consciousness Alert  Orientation Level Oriented X4  Cognition Appropriate at baseline;Follows commands;Other  (Comment) (confused at times; particularly upon awakening)  Speech Clear  Pupil Assessment  Yes  R Pupil Size (mm) 2  R Pupil Shape Round  R Pupil Reaction Brisk  L Pupil Size (mm) 2  L Pupil Shape Round  L Pupil Reaction Brisk  R Hand Grip Moderate;Present  L Hand Grip Moderate ;Present  RUE Motor Response Purposeful movement;Responds to commands;Other (Comment) (full ROM )  RUE Sensation Full sensation  LUE Motor Response Purposeful movement;Responds to commands;Other (Comment) (full ROM)  LUE Sensation Full sensation  RLE Motor Response Purposeful movement;Responds to commands (limited ROM d/t R TKA)  RLE Sensation Full sensation  RLE Motor Strength 4  LLE Motor Response Purposeful movement;Responds to commands;Other (Comment) (full ROM)  LLE Sensation Full sensation;No numbness;No tingling  LLE Motor Strength 5  Neuro Symptoms None  Musculoskeletal  Musculoskeletal (WDL) X (no new onset of pain)  Assistive Device Front wheel walker;Overhead trapeze  Generalized Weakness Yes  Weight Bearing Restrictions Yes  RLE Weight Bearing WBAT  Musculoskeletal Details  Right Knee Limited movement;Ortho/Supportive Device;Weakness;Surgery  Right Knee Ortho/Supportive Device Ace wrap;CPM;Knee Immobilizer  Integumentary  Integumentary (WDL) X  Skin Color Appropriate for ethnicity  Skin Condition Dry  Skin Integrity Surgical Incision (see LDA);Abrasion  Abrasion Location Back  Abrasion Location Orientation Right;Lateral;Mid  Abrasion Intervention Cleansed;Foam;Other (Comment) (pink foam x2)  Skin Turgor Non-tenting  Pain Assessment  Result of Injury No

## 2014-11-27 NOTE — Discharge Planning (Signed)
Patient to be discharged to [facility]. Patient and patient's daughter updated at bedside.  Facility: [facility] RN report number: [phone] Transportation: EMS Corey Harold)  *If patient medically stable after 4:30pm, RN please call [phone] [place] [phone]) and Surgical [phone])

## 2014-11-27 NOTE — Clinical Social Work Placement (Signed)
   CLINICAL SOCIAL WORK PLACEMENT  NOTE  Date:  11/27/2014  Patient Details  Name: Ryan Humphrey MRN: [id number] Date of Birth: 02-25-1929  Clinical Social Work is seeking post-discharge placement for this patient at the [place] level of care (*CSW will initial, date and re-position this form in  chart as items are completed):  Yes   Patient/family provided with [place] Work Department's list of facilities offering this level of care within the geographic area requested by the patient (or if unable, by the patient's family).  Yes   Patient/family informed of their freedom to choose among providers that offer the needed level of care, that participate in Medicare, Medicaid or managed care program needed by the patient, have an available bed and are willing to accept the patient.  Yes   Patient/family informed of [facility]'s ownership interest in [facility] and [facility], as well as of the fact that they are under no obligation to receive care at these facilities.  PASRR submitted to EDS on 11/27/14     PASRR number received on 11/27/14     Existing PASRR number confirmed on  (n/a)     FL2 transmitted to all facilities in geographic area requested by pt/family on 11/27/14     FL2 transmitted to all facilities within larger geographic area on  (n/a)     Patient informed that his/her managed care company has contracts with or will negotiate with certain facilities, including the following:   (yes, [place].)     Yes   Patient/family informed of bed offers received.  Patient chooses bed at [facility]     Physician recommends and patient chooses bed at [facility]    Patient to be transferred to [facility] on 11/27/14.  Patient to be transferred to facility by [facility]     Patient family notified on 11/27/14 of transfer.  Name of family member notified:  Patient and daughter updated at bedside.     PHYSICIAN        Additional Comment:    _______________________________________________ Caroline Sauger, LCSW 11/27/2014, 2:15 PM [phone]

## 2014-11-27 NOTE — Progress Notes (Signed)
Subjective: 2 Days Post-Op Procedure(s) (LRB): RIGHT TOTAL KNEE ARTHROPLASTY (Right)   Patient got out of bed on his own lastnight and had a fall. He scrapped up his back but was not having any increase in knee pain. He had x-rays of his knee done to make sure everything was ok. He also believes that he may be getting a UTI and is asking for a UA to be done. He is seen today with Joseph Art our RN case Freight forwarder. She states that they have a private bed waiting for him at [facility] place and she will be able to follow his UA results closely.   Activity level:  wbat Diet tolerance:  ok Voiding:  Possible UTI with scant urination Patient reports pain as mild and moderate.    Objective: Vital signs in last 24 hours: Temp:  [97.7 F (36.5 C)-99.1 F (37.3 C)] 97.7 F (36.5 C) (06/16 0448) Pulse Rate:  [73-100] 73 (06/16 0448) Resp:  [18-20] 18 (06/16 0448) BP: (92-131)/(39-65) 97/39 mmHg (06/16 0448) SpO2:  [83 %-100 %] 99 % (06/16 0448) Weight:  [81.647 kg (180 lb)] 81.647 kg (180 lb) (06/15 2326)  Labs:  Recent Labs  11/26/14 0554 11/27/14 0633  HGB 9.8* 8.1*    Recent Labs  11/26/14 0554 11/27/14 0633  WBC 9.5 8.6  RBC 3.10* 2.54*  HCT 29.4* 24.1*  PLT 117* 95*    Recent Labs  11/26/14 0554  NA 138  K 4.2  CL 104  CO2 28  BUN 17  CREATININE 1.40*  GLUCOSE 146*  CALCIUM 8.0*   No results for input(s): LABPT, INR in the last 72 hours.  Physical Exam:  Neurologically intact ABD soft Neurovascular intact Sensation intact distally Intact pulses distally Dorsiflexion/Plantar flexion intact Incision: dressing C/D/I and scant drainage No cellulitis present Compartment soft   There were what appear to be fracture blisters around the knee. No signs of infection noted. There are also some abraisions on the right side of his thoracic spine from his fall with no signs of infection.   Assessment/Plan:  2 Days Post-Op Procedure(s) (LRB): RIGHT TOTAL KNEE ARTHROPLASTY  (Right) Advance diet Up with therapy Discharge to [facility] today Dressing changed today. We will keep a close eye on the fracture blisters. We will do an in and out cath today and send it for urinalysis. This will be followed up closely at [facility] place  Continue on ASA 325mg  BID x 2 weeks post op. They will also keep a close eye on his back abraisions.  Tonianne Fine, Larwance Sachs 11/27/2014, 9:43 AM

## 2014-12-01 ENCOUNTER — Non-Acute Institutional Stay (SKILLED_NURSING_FACILITY): Payer: Medicare Other | Admitting: Internal Medicine

## 2014-12-01 ENCOUNTER — Other Ambulatory Visit: Payer: Self-pay | Admitting: *Deleted

## 2014-12-01 DIAGNOSIS — F32A Depression, unspecified: Secondary | ICD-10-CM

## 2014-12-01 DIAGNOSIS — K5900 Constipation, unspecified: Secondary | ICD-10-CM

## 2014-12-01 DIAGNOSIS — E785 Hyperlipidemia, unspecified: Secondary | ICD-10-CM | POA: Diagnosis not present

## 2014-12-01 DIAGNOSIS — I10 Essential (primary) hypertension: Secondary | ICD-10-CM

## 2014-12-01 DIAGNOSIS — G629 Polyneuropathy, unspecified: Secondary | ICD-10-CM

## 2014-12-01 DIAGNOSIS — D62 Acute posthemorrhagic anemia: Secondary | ICD-10-CM

## 2014-12-01 DIAGNOSIS — F329 Major depressive disorder, single episode, unspecified: Secondary | ICD-10-CM

## 2014-12-01 DIAGNOSIS — I2510 Atherosclerotic heart disease of native coronary artery without angina pectoris: Secondary | ICD-10-CM

## 2014-12-01 DIAGNOSIS — M810 Age-related osteoporosis without current pathological fracture: Secondary | ICD-10-CM | POA: Diagnosis not present

## 2014-12-01 DIAGNOSIS — N3000 Acute cystitis without hematuria: Secondary | ICD-10-CM

## 2014-12-01 DIAGNOSIS — N179 Acute kidney failure, unspecified: Secondary | ICD-10-CM | POA: Diagnosis not present

## 2014-12-01 DIAGNOSIS — R441 Visual hallucinations: Secondary | ICD-10-CM | POA: Diagnosis not present

## 2014-12-01 DIAGNOSIS — S80821S Blister (nonthermal), right lower leg, sequela: Secondary | ICD-10-CM

## 2014-12-01 DIAGNOSIS — M1711 Unilateral primary osteoarthritis, right knee: Secondary | ICD-10-CM

## 2014-12-01 MED ORDER — TRAMADOL HCL 50 MG PO TABS: ORAL_TABLET | ORAL | Status: DC

## 2014-12-01 NOTE — Telephone Encounter (Signed)
[place]

## 2014-12-01 NOTE — Progress Notes (Signed)
Patient ID: Ryan Humphrey, male   DOB: 07-Sep-1928, 81 y.o.   MRN: [id number]     Facility: [facility]    PCP: Dorian Heckle, MD  Code Status: DNR  Allergies  Allergen Reactions  . Penicillins Other (See Comments)    "rash"    Chief Complaint  Patient presents with  . New Admit To SNF     HPI:  81 year old patient is here for short term rehabilitation post hospital admission from 11/25/14-11/27/14 with primary OA of right knee. He underwent right total knee arthroplasty. He is seen in his room today. His pain is under control with current regimen. He has burning and pain with urination and increased urinary frequency. He denies any other concerns. His daughter mentions him to be seeing things in the room that is not there and reaching for them. This is new for him.  Of note, he had a fall in the hospital and has bruise on his back and legs along with blisters in the legs. He is being followed by wound care team. No new concern from staff. No falls in the facility. He has PMH of HLD, arthritis, neuropathy, gerd, prostate cancer among others.   Review of Systems:  Constitutional: Negative for fever, chills, diaphoresis.  HENT: Negative for headache, congestion, nasal discharge Eyes: Negative for eye pain, blurred vision, double vision and discharge.  Respiratory: Negative for cough, shortness of breath and wheezing.   Cardiovascular: Negative for chest pain, palpitations, leg swelling.  Gastrointestinal: Negative for heartburn, nausea, vomiting, abdominal pain. No bowel movement since 11/26/14 Musculoskeletal: Negative for back pain, falls Skin: Negative for itching, rash.  Neurological: Negative for dizziness, tingling, focal weakness Psychiatric/Behavioral: Negative for depression   Past Medical History  Diagnosis Date  . Neuropathy   . Hyperlipidemia   . Arthritis     arthritis,osteopenia,"spinal stenosis"  . GERD (gastroesophageal reflux  disease)   . H/O hiatal hernia   . H/O vertigo 12-06-12    none recent  . Anemia   . Depression   . Cancer 12-06-12    Prostate cancer'98  . Peripheral neuropathy 12-06-12    peripheral neuropathy  . RLS (restless legs syndrome)   . Rhinitis   . CAD (coronary artery disease)   . Osteopenia   . Vitamin B12 deficiency   . IBS (irritable bowel syndrome)    Past Surgical History  Procedure Laterality Date  . Appendectomy    . Cholecystectomy    . Carpal tunnel release      both hands  . Prostate surgery  12-06-12  . Cataract surgery Left 12-06-12    recent surgery  . Esophagogastroduodenoscopy (egd) with propofol N/A 12/25/2012    Procedure: ESOPHAGOGASTRODUODENOSCOPY (EGD) WITH PROPOFOL;  Surgeon: Garlan Fair, MD;  Location: [facility];  Service: Endoscopy;  Laterality: N/A;  . Colonoscopy with propofol N/A 12/25/2012    Procedure: COLONOSCOPY WITH PROPOFOL;  Surgeon: Garlan Fair, MD;  Location: [facility];  Service: Endoscopy;  Laterality: N/A;  . Tonsillectomy    . Total knee arthroplasty Right 11/25/2014    Procedure: RIGHT TOTAL KNEE ARTHROPLASTY;  Surgeon: Melrose Nakayama, MD;  Location: [place];  Service: Orthopedics;  Laterality: Right;   Social History:   reports that he quit smoking about 51 years ago. He has never used smokeless tobacco. He reports that he does not drink alcohol or use illicit drugs.  Family History  Problem Relation Age of Onset  . CAD Father  Deceased, 16  . Dementia Mother     Deceased, 57  . Colon cancer Neg Hx   . Colon polyps Neg Hx   . Kidney disease Neg Hx   . Diabetes Neg Hx   . Esophageal cancer Neg Hx   . Gallbladder disease Neg Hx     Medications: Patient's Medications  New Prescriptions   No medications on file  Previous Medications   ALENDRONATE (FOSAMAX) 70 MG TABLET    Take 70 mg by mouth every Saturday. Take with a full glass of water on an empty stomach.   ASPIRIN EC 325 MG EC TABLET    Take 1 tablet (325 mg  total) by mouth 2 (two) times daily after a meal.   CALCIUM CARBONATE-VITAMIN D (CALTRATE 600+D PO)    Take 1 tablet by mouth 2 (two) times daily.   CHOLECALCIFEROL (VITAMIN D) 2000 UNITS TABLET    Take 2,000 Units by mouth daily.   CYANOCOBALAMIN (,VITAMIN B-12,) 1000 MCG/ML INJECTION    Inject 1,000 mcg into the muscle every 30 (thirty) days.    DIPHENOXYLATE-ATROPINE (LOMOTIL) 2.5-0.025 MG PER TABLET    Take 1 tablet by mouth 4 (four) times daily as needed for diarrhea or loose stools.   GABAPENTIN (NEURONTIN) 300 MG CAPSULE    Take 300 mg by mouth 4 (four) times daily.    HYDROCODONE-ACETAMINOPHEN (NORCO/VICODIN) 5-325 MG PER TABLET    Take 1-2 tablets by mouth every 4 (four) hours as needed (breakthrough pain).   METHOCARBAMOL (ROBAXIN) 500 MG TABLET    Take 1 tablet (500 mg total) by mouth every 6 (six) hours as needed for muscle spasms.   PROPRANOLOL (INDERAL) 20 MG TABLET    Take 1 tablet (20 mg total) by mouth 2 (two) times daily.   SERTRALINE (ZOLOFT) 100 MG TABLET    Take 100 mg by mouth daily.   SIMVASTATIN (ZOCOR) 20 MG TABLET    Take 20 mg by mouth every evening.   VITAMIN E PO    Take 1 tablet by mouth daily.  Modified Medications   No medications on file  Discontinued Medications   No medications on file     Physical Exam: Filed Vitals:   12/01/14 1350  BP: 148/75  Pulse: 84  Temp: 96.4 F (35.8 C)  Resp: 18  SpO2: 95%    General- elderly male, well built, in no acute distress Head- normocephalic, atraumatic Throat- moist mucus membrane Neck- no cervical lymphadenopathy Cardiovascular- normal s1,s2, no murmurs, palpable dorsalis pedis and radial pulses, trace right leg edema Respiratory- bilateral clear to auscultation, no wheeze, no rhonchi, no crackles, no use of accessory muscles Abdomen- bowel sounds present, soft, non tender Musculoskeletal- able to move all 4 extremities, right leg ROM limited at knee Neurological- no focal deficit Skin- warm and dry,  right knee surgical incision with steri strips, small blister on few of the steri strip area noted. right posterior chest area and back has bruising. right thigh and leg bruise noted. Right leg has 3 fluid filled blisters- likely blood and 1 open blister, no active drainage.  Psychiatry- alert and oriented to person, place and time, normal mood and affect    Labs reviewed: Basic Metabolic Panel:  Recent Labs  06/23/14 0925 11/13/14 1133 11/26/14 0554  NA 140 144 138  K 3.6 4.5 4.2  CL 108 109 104  CO2 27 28 28   GLUCOSE 105* 96 146*  BUN 13 19 17   CREATININE 1.22 1.33* 1.40*  CALCIUM 8.7  8.8* 8.0*   Liver Function Tests:  Recent Labs  06/23/14 0925  AST 20  ALT 18  ALKPHOS 65  BILITOT 0.9  PROT 6.1  ALBUMIN 3.9   No results for input(s): LIPASE, AMYLASE in the last 8760 hours. No results for input(s): AMMONIA in the last 8760 hours. CBC:  Recent Labs  06/23/14 0925 11/13/14 1133 11/26/14 0554 11/27/14 0633  WBC 5.5 6.6 9.5 8.6  NEUTROABS 4.2 5.0  --   --   HGB 13.3 12.5* 9.8* 8.1*  HCT 38.4* 38.1* 29.4* 24.1*  MCV 92.5 95.0 94.8 94.9  PLT 112* 111* 117* 95*    Assessment/Plan  Right knee OA S/p right TKA. Has follow up with orthopedics. Continue aspirin 325 mg bid for dvt prophylaxis. on norco 5-325 1-2 tab q4h prn pain and robaxin 500 mg q6h prn muscle spasm. With his hallucination, d/c norco and have him on tramadol 100 mg q4h prn for pain and reassess. Will have him work with physical therapy and occupational therapy team to help with gait training and muscle strengthening exercises.fall precautions. Skin care. Encourage to be out of bed. Continue vitamin d supplement  Blood loss anemia Post op, monitor h&h  Acute renal failure Good urine output, no symptom. Monitor renal function, hydration encouraged  Constipation Likely from limited mobility and pain medication. Start senna s 2 tab daily and miralax daily x 3 days, then daily prn and monitor.  Hydration  Acute cystitis Send u/a with culture. Reviewed u/a from hospital 11/27/14 normal. With dysuria and increased frequency, start empiric treatment with bactrim d s 1 tab bid x 5 days and review culture report. Hydration encouraged  Hallucination Possible from infection from uti vs norco. D/c norco and have him on tramadol 100 mg q4 hour as needed and reassess  Blister right leg Multiple blisters, monitor and continue skin care, no signs of infection at present.  HTN Elevated SBP reading but given his age will have goal of < 150/90, monitor bp q shoft for now and reassess, continue inderal 20 mg bid and monitor bp  Osteoporosis Continue weekly fosamax, vitamin d and caltrate. Fall precautions  CAD Remains chest pain free. Continue inderal and zocor  Neuropathy Continue neurontin 300 mg qid, monitor  Depression Stable mood, continue home regimen zoloft 100 mg daily  HLD Stable, continue zocor 20 mg daily   Goals of care: short term rehabilitation   Labs/tests ordered: cbc, cmp  Family/ staff Communication: reviewed care plan with patient and nursing supervisor    Blanchie Serve, MD  [facility] [phone] (Monday-Friday 8 am - 5 pm) [phone] (afterhours)

## 2014-12-03 ENCOUNTER — Non-Acute Institutional Stay (SKILLED_NURSING_FACILITY): Payer: Medicare Other | Admitting: Nurse Practitioner

## 2014-12-03 DIAGNOSIS — S80821S Blister (nonthermal), right lower leg, sequela: Secondary | ICD-10-CM

## 2014-12-03 DIAGNOSIS — G629 Polyneuropathy, unspecified: Secondary | ICD-10-CM

## 2014-12-03 DIAGNOSIS — N3000 Acute cystitis without hematuria: Secondary | ICD-10-CM

## 2014-12-03 DIAGNOSIS — M1711 Unilateral primary osteoarthritis, right knee: Secondary | ICD-10-CM | POA: Diagnosis not present

## 2014-12-03 DIAGNOSIS — I10 Essential (primary) hypertension: Secondary | ICD-10-CM | POA: Diagnosis not present

## 2014-12-03 DIAGNOSIS — D62 Acute posthemorrhagic anemia: Secondary | ICD-10-CM

## 2014-12-03 DIAGNOSIS — R441 Visual hallucinations: Secondary | ICD-10-CM | POA: Diagnosis not present

## 2014-12-03 DIAGNOSIS — K5900 Constipation, unspecified: Secondary | ICD-10-CM

## 2014-12-03 DIAGNOSIS — E785 Hyperlipidemia, unspecified: Secondary | ICD-10-CM | POA: Diagnosis not present

## 2014-12-03 NOTE — Progress Notes (Signed)
Patient ID: Ryan Humphrey, male   DOB: [birth year], 81 y.o.   MRN: [id number]    Nursing Home Location:  [place] of Service: SNF (31)  PCP: Dorian Heckle, MD  Allergies  Allergen Reactions  . Penicillins Other (See Comments)    "rash"    Chief Complaint  Patient presents with  . Discharge Note    HPI:  Patient is a 81 y.o. male seen today at [facility] for discharge home. pt with a PMH of HLD, arthritis, neuropathy, gerd, prostate cancer. He is at [facility] place for for short term rehabilitation post hospital admission from 11/25/14-11/27/14 with primary OA of right knee. He underwent right total knee arthroplasty. Pt with blood filled blisters to right knee being followed by wound care. No signs of infection to knee, some blisters are intact others have opened,  dressing being changed daily and being followed by treatment nurse while at [facility]. Daughter at bedside during visit.  Patient currently doing well with therapy, now stable to discharge home with home health.   Review of Systems:  Review of Systems  Constitutional: Negative for activity change, appetite change, fatigue and unexpected weight change.  HENT: Negative for congestion and hearing loss.   Eyes: Negative.   Respiratory: Negative for cough and shortness of breath.   Cardiovascular: Negative for chest pain, palpitations and leg swelling.  Gastrointestinal: Negative for abdominal pain, diarrhea and constipation.       Pt without recent bowel movement, however pt reports his of IBS, diarrhea mostly and does not have any complaints of constipation, abdominal pain or cramping despite no recent BM  Genitourinary: Negative for dysuria and difficulty urinating.  Musculoskeletal: Positive for arthralgias. Negative for myalgias.       To knee, pain controlled  Skin: Negative for color change and wound.  Neurological: Negative for dizziness and weakness.    Psychiatric/Behavioral: Negative for behavioral problems, confusion and agitation.    Past Medical History  Diagnosis Date  . Neuropathy   . Hyperlipidemia   . Arthritis     arthritis,osteopenia,"spinal stenosis"  . GERD (gastroesophageal reflux disease)   . H/O hiatal hernia   . H/O vertigo 12-06-12    none recent  . Anemia   . Depression   . Cancer 12-06-12    Prostate cancer'98  . Peripheral neuropathy 12-06-12    peripheral neuropathy  . RLS (restless legs syndrome)   . Rhinitis   . CAD (coronary artery disease)   . Osteopenia   . Vitamin B12 deficiency   . IBS (irritable bowel syndrome)    Past Surgical History  Procedure Laterality Date  . Appendectomy    . Cholecystectomy    . Carpal tunnel release      both hands  . Prostate surgery  12-06-12  . Cataract surgery Left 12-06-12    recent surgery  . Esophagogastroduodenoscopy (egd) with propofol N/A 12/25/2012    Procedure: ESOPHAGOGASTRODUODENOSCOPY (EGD) WITH PROPOFOL;  Surgeon: Garlan Fair, MD;  Location: [facility];  Service: Endoscopy;  Laterality: N/A;  . Colonoscopy with propofol N/A 12/25/2012    Procedure: COLONOSCOPY WITH PROPOFOL;  Surgeon: Garlan Fair, MD;  Location: [facility];  Service: Endoscopy;  Laterality: N/A;  . Tonsillectomy    . Total knee arthroplasty Right 11/25/2014    Procedure: RIGHT TOTAL KNEE ARTHROPLASTY;  Surgeon: Melrose Nakayama, MD;  Location: [place];  Service: Orthopedics;  Laterality: Right;   Social History:  reports that he quit smoking about 51 years ago. He has never used smokeless tobacco. He reports that he does not drink alcohol or use illicit drugs.  Family History  Problem Relation Age of Onset  . CAD Father     Deceased, 17  . Dementia Mother     Deceased, 44  . Colon cancer Neg Hx   . Colon polyps Neg Hx   . Kidney disease Neg Hx   . Diabetes Neg Hx   . Esophageal cancer Neg Hx   . Gallbladder disease Neg Hx     Medications: Patient's Medications   New Prescriptions   No medications on file  Previous Medications   ALENDRONATE (FOSAMAX) 70 MG TABLET    Take 70 mg by mouth every Saturday. Take with a full glass of water on an empty stomach.   ASPIRIN EC 325 MG EC TABLET    Take 1 tablet (325 mg total) by mouth 2 (two) times daily after a meal.   CALCIUM CARBONATE-VITAMIN D (CALTRATE 600+D PO)    Take 1 tablet by mouth 2 (two) times daily.   CHOLECALCIFEROL (VITAMIN D) 2000 UNITS TABLET    Take 2,000 Units by mouth daily.   CYANOCOBALAMIN (,VITAMIN B-12,) 1000 MCG/ML INJECTION    Inject 1,000 mcg into the muscle every 30 (thirty) days.    DIPHENOXYLATE-ATROPINE (LOMOTIL) 2.5-0.025 MG PER TABLET    Take 1 tablet by mouth 4 (four) times daily as needed for diarrhea or loose stools.   GABAPENTIN (NEURONTIN) 300 MG CAPSULE    Take 300 mg by mouth 4 (four) times daily.    HYDROCODONE-ACETAMINOPHEN (NORCO/VICODIN) 5-325 MG PER TABLET    Take 1-2 tablets by mouth every 4 (four) hours as needed (breakthrough pain).   METHOCARBAMOL (ROBAXIN) 500 MG TABLET    Take 1 tablet (500 mg total) by mouth every 6 (six) hours as needed for muscle spasms.   PROPRANOLOL (INDERAL) 20 MG TABLET    Take 1 tablet (20 mg total) by mouth 2 (two) times daily.   SERTRALINE (ZOLOFT) 100 MG TABLET    Take 100 mg by mouth daily.   SIMVASTATIN (ZOCOR) 20 MG TABLET    Take 20 mg by mouth every evening.   TRAMADOL (ULTRAM) 50 MG TABLET    Take two tablets by mouth every 4 hours as needed for pain. Hold for sedation   VITAMIN E PO    Take 1 tablet by mouth daily.  Modified Medications   No medications on file  Discontinued Medications   No medications on file     Physical Exam: Filed Vitals:   12/07/14 1021  BP: 124/47  Pulse: 89  Temp: 97.8 F (36.6 C)  Resp: 20    Physical Exam  Constitutional: He is oriented to person, place, and time. He appears well-developed and well-nourished. No distress.  HENT:  Head: Normocephalic and atraumatic.  Mouth/Throat:  Oropharynx is clear and moist. No oropharyngeal exudate.  Eyes: Conjunctivae and EOM are normal. Pupils are equal, round, and reactive to light.  Neck: Normal range of motion. Neck supple.  Cardiovascular: Normal rate, regular rhythm and normal heart sounds.   Pulmonary/Chest: Effort normal and breath sounds normal.  Abdominal: Soft. Bowel sounds are normal.  Musculoskeletal: He exhibits no edema or tenderness.  right leg ROM limited at knee  Neurological: He is alert and oriented to person, place, and time.  Skin: Skin is warm and dry. He is not diaphoretic.  right knee surgical incision with steri  strips, right thigh and leg bruise noted (pt reports this has improved) Right leg has multiple fluid filled blisters with one opened blister   Psychiatric: He has a normal mood and affect.    Labs reviewed: Basic Metabolic Panel:  Recent Labs  06/23/14 0925 11/13/14 1133 11/26/14 0554  NA 140 144 138  K 3.6 4.5 4.2  CL 108 109 104  CO2 27 28 28   GLUCOSE 105* 96 146*  BUN 13 19 17   CREATININE 1.22 1.33* 1.40*  CALCIUM 8.7 8.8* 8.0*   Liver Function Tests:  Recent Labs  06/23/14 0925  AST 20  ALT 18  ALKPHOS 65  BILITOT 0.9  PROT 6.1  ALBUMIN 3.9   No results for input(s): LIPASE, AMYLASE in the last 8760 hours. No results for input(s): AMMONIA in the last 8760 hours. CBC:  Recent Labs  06/23/14 0925 11/13/14 1133 11/26/14 0554 11/27/14 0633  WBC 5.5 6.6 9.5 8.6  NEUTROABS 4.2 5.0  --   --   HGB 13.3 12.5* 9.8* 8.1*  HCT 38.4* 38.1* 29.4* 24.1*  MCV 92.5 95.0 94.8 94.9  PLT 112* 111* 117* 95*   TSH:  Recent Labs  02/25/14 1047 09/03/14 1054  TSH 2.638 0.94   A1C: No results found for: HGBA1C Lipid Panel: No results for input(s): CHOL, HDL, LDLCALC, TRIG, CHOLHDL, LDLDIRECT in the last 8760 hours.    WBC  7.4  4.0-10.5  K/uL    SLN  RBC  2.36  4.22-5.81  MIL/uL  L    Hemoglobin  7.4  13.0-17.0  g/dL  L    Hematocrit  22.1  39.0-52.0  %  L    MCV    93.6  78.0-100.0  fL      MCH  31.4  26.0-34.0  pg      MCHC  33.5  30.0-36.0  g/dL      RDW  14.1  11.5-15.5  %      Platelet Count  210  150-400  K/uL      MPV  9.6  8.6-12.4  fL      Granulocyte %  76  43-77  %      Absolute Gran  5.6  1.7-7.7  K/uL      Lymph %  11  12-46  %  L    Absolute Lymph  0.8  0.7-4.0  K/uL      Mono %  12  3-12  %      Absolute Mono  0.9  0.1-1.0  K/uL      Eos %  1  0-5  %      Absolute Eos  0.1  0.0-0.7  K/uL      Baso %  0  0-1  %      Absolute Baso  0.0  0.0-0.1  K/uL      Smear Review             Polychromasia present (1-2/hpf) WBC and Platelet morphology unremarkable.  Comprehensive Metabolic Panel  Status: Final Out of Range  Result Date: 12/02/14 12:18 PM      Analyte   Result Value   Ref. Range    Units   Out of Range   Lab  Sodium  142  135-145  mEq/L    SLN  Potassium  4.5  3.5-5.3  mEq/L      Chloride  105  96-112  mEq/L      CO2  25  19-32  mEq/L  Glucose  107  70-99  mg/dL  H    BUN  32  6-23  mg/dL  H    Creatinine  1.32  0.50-1.35  mg/dL      Bilirubin, Total  1.0  0.2-1.2  mg/dL      Alkaline Phosphatase  44  39-117  U/L      AST/SGOT  21  0-37  U/L      ALT/SGPT  19  0-53  U/L      Total Protein  5.2  6.0-8.3  g/dL  L    Albumin  3.1  3.5-5.2  g/dL  L    Calcium  8.9  8.4-10.5  mg/dL  Assessment/Plan 1. HYPERTENSION, BENIGN -controlled on current regimen at this time  2. Primary osteoarthritis of right knee s/p right TKA Continue aspirin 325 mg bid for dvt prophylaxis.  Continue on robaxin 500 mg q6h prn muscle spasm, tramadol 100 mg q4h prn for pain (max of 6 tablets in 24 hours)  -has follow up with orthopedic scheduled   3. Acute cystitis without hematuria Improved, to complete bactrim d s 1 tab bid for a total of 5 days   4. Constipation, unspecified constipation type Post-op, pt does not feel like he has constipation however has not had BM, has good bowel sounds, abdomen soft non tender not distended, reports  he has IBS-D and is able to well manage bowels at home. Will increase fluid and prune intake at this time and if needed will take PRN  5. Hyperlipidemia -conts zocor  6. Hallucination, visual -no ongoing hallunicinations  7. Acute blood loss anemia -hgb down from admission, will add iron 325 mg BID  -will need ongoing monitoring of hemoglobin as outpt  8. Neuropathy -continues neurontin 300 mg QID   9. Blister of leg without infection, right, sequela -without signs of infections, treatment nurse reports improvement since admission, daughter at bedside concerned over care once they return home. Will have home health nursing for ongoing evaluation and treatment -staff to provide ongoing teaching for home care  pt is stable for discharge-plans for outpt therapy in place,  Rx written.  will need home health nursing for wound care to right knee due to open areas and blisters -to follow up with PCP within 2 weeks.      Carlos American. Harle Battiest  [facility] [phone] 8 am - 5 pm) [phone] (after hours)

## 2014-12-05 DIAGNOSIS — Z96651 Presence of right artificial knee joint: Secondary | ICD-10-CM | POA: Diagnosis not present

## 2014-12-05 DIAGNOSIS — D72829 Elevated white blood cell count, unspecified: Secondary | ICD-10-CM | POA: Diagnosis not present

## 2014-12-05 DIAGNOSIS — R262 Difficulty in walking, not elsewhere classified: Secondary | ICD-10-CM | POA: Diagnosis not present

## 2014-12-05 DIAGNOSIS — M25561 Pain in right knee: Secondary | ICD-10-CM | POA: Diagnosis not present

## 2014-12-05 DIAGNOSIS — D649 Anemia, unspecified: Secondary | ICD-10-CM | POA: Diagnosis not present

## 2014-12-05 DIAGNOSIS — M25661 Stiffness of right knee, not elsewhere classified: Secondary | ICD-10-CM | POA: Diagnosis not present

## 2014-12-05 DIAGNOSIS — D510 Vitamin B12 deficiency anemia due to intrinsic factor deficiency: Secondary | ICD-10-CM | POA: Diagnosis not present

## 2014-12-08 DIAGNOSIS — Z471 Aftercare following joint replacement surgery: Secondary | ICD-10-CM | POA: Diagnosis not present

## 2014-12-08 DIAGNOSIS — Z96651 Presence of right artificial knee joint: Secondary | ICD-10-CM | POA: Diagnosis not present

## 2014-12-09 DIAGNOSIS — M25561 Pain in right knee: Secondary | ICD-10-CM | POA: Diagnosis not present

## 2014-12-09 DIAGNOSIS — Z96651 Presence of right artificial knee joint: Secondary | ICD-10-CM | POA: Diagnosis not present

## 2014-12-09 DIAGNOSIS — R262 Difficulty in walking, not elsewhere classified: Secondary | ICD-10-CM | POA: Diagnosis not present

## 2014-12-09 DIAGNOSIS — M25661 Stiffness of right knee, not elsewhere classified: Secondary | ICD-10-CM | POA: Diagnosis not present

## 2014-12-11 DIAGNOSIS — M25561 Pain in right knee: Secondary | ICD-10-CM | POA: Diagnosis not present

## 2014-12-11 DIAGNOSIS — Z96651 Presence of right artificial knee joint: Secondary | ICD-10-CM | POA: Diagnosis not present

## 2014-12-11 DIAGNOSIS — R262 Difficulty in walking, not elsewhere classified: Secondary | ICD-10-CM | POA: Diagnosis not present

## 2014-12-11 DIAGNOSIS — M25661 Stiffness of right knee, not elsewhere classified: Secondary | ICD-10-CM | POA: Diagnosis not present

## 2014-12-16 DIAGNOSIS — Z96651 Presence of right artificial knee joint: Secondary | ICD-10-CM | POA: Diagnosis not present

## 2014-12-16 DIAGNOSIS — R262 Difficulty in walking, not elsewhere classified: Secondary | ICD-10-CM | POA: Diagnosis not present

## 2014-12-16 DIAGNOSIS — M25561 Pain in right knee: Secondary | ICD-10-CM | POA: Diagnosis not present

## 2014-12-16 DIAGNOSIS — M25661 Stiffness of right knee, not elsewhere classified: Secondary | ICD-10-CM | POA: Diagnosis not present

## 2014-12-18 DIAGNOSIS — R262 Difficulty in walking, not elsewhere classified: Secondary | ICD-10-CM | POA: Diagnosis not present

## 2014-12-18 DIAGNOSIS — M25661 Stiffness of right knee, not elsewhere classified: Secondary | ICD-10-CM | POA: Diagnosis not present

## 2014-12-18 DIAGNOSIS — M25561 Pain in right knee: Secondary | ICD-10-CM | POA: Diagnosis not present

## 2014-12-18 DIAGNOSIS — Z96651 Presence of right artificial knee joint: Secondary | ICD-10-CM | POA: Diagnosis not present

## 2014-12-22 DIAGNOSIS — R262 Difficulty in walking, not elsewhere classified: Secondary | ICD-10-CM | POA: Diagnosis not present

## 2014-12-22 DIAGNOSIS — M25561 Pain in right knee: Secondary | ICD-10-CM | POA: Diagnosis not present

## 2014-12-22 DIAGNOSIS — M25661 Stiffness of right knee, not elsewhere classified: Secondary | ICD-10-CM | POA: Diagnosis not present

## 2014-12-22 DIAGNOSIS — Z96651 Presence of right artificial knee joint: Secondary | ICD-10-CM | POA: Diagnosis not present

## 2014-12-24 DIAGNOSIS — M25561 Pain in right knee: Secondary | ICD-10-CM | POA: Diagnosis not present

## 2014-12-25 DIAGNOSIS — Z96651 Presence of right artificial knee joint: Secondary | ICD-10-CM | POA: Diagnosis not present

## 2014-12-25 DIAGNOSIS — R262 Difficulty in walking, not elsewhere classified: Secondary | ICD-10-CM | POA: Diagnosis not present

## 2014-12-25 DIAGNOSIS — M25661 Stiffness of right knee, not elsewhere classified: Secondary | ICD-10-CM | POA: Diagnosis not present

## 2014-12-25 DIAGNOSIS — M25561 Pain in right knee: Secondary | ICD-10-CM | POA: Diagnosis not present

## 2014-12-29 DIAGNOSIS — M25661 Stiffness of right knee, not elsewhere classified: Secondary | ICD-10-CM | POA: Diagnosis not present

## 2014-12-29 DIAGNOSIS — R262 Difficulty in walking, not elsewhere classified: Secondary | ICD-10-CM | POA: Diagnosis not present

## 2014-12-29 DIAGNOSIS — Z96651 Presence of right artificial knee joint: Secondary | ICD-10-CM | POA: Diagnosis not present

## 2014-12-29 DIAGNOSIS — M25561 Pain in right knee: Secondary | ICD-10-CM | POA: Diagnosis not present

## 2014-12-31 DIAGNOSIS — M25561 Pain in right knee: Secondary | ICD-10-CM | POA: Diagnosis not present

## 2015-01-01 DIAGNOSIS — M25561 Pain in right knee: Secondary | ICD-10-CM | POA: Diagnosis not present

## 2015-01-01 DIAGNOSIS — Z96651 Presence of right artificial knee joint: Secondary | ICD-10-CM | POA: Diagnosis not present

## 2015-01-01 DIAGNOSIS — M25661 Stiffness of right knee, not elsewhere classified: Secondary | ICD-10-CM | POA: Diagnosis not present

## 2015-01-01 DIAGNOSIS — R262 Difficulty in walking, not elsewhere classified: Secondary | ICD-10-CM | POA: Diagnosis not present

## 2015-01-03 ENCOUNTER — Inpatient Hospital Stay (HOSPITAL_COMMUNITY)
Admission: EM | Admit: 2015-01-03 | Discharge: 2015-01-04 | DRG: 312 | Disposition: A | Discharge status: Home / Self Care | Payer: Medicare Other | Attending: Internal Medicine | Admitting: Internal Medicine

## 2015-01-03 ENCOUNTER — Emergency Department (HOSPITAL_COMMUNITY): Payer: Medicare Other

## 2015-01-03 ENCOUNTER — Encounter (HOSPITAL_COMMUNITY): Payer: Self-pay | Admitting: *Deleted

## 2015-01-03 DIAGNOSIS — E860 Dehydration: Secondary | ICD-10-CM | POA: Diagnosis present

## 2015-01-03 DIAGNOSIS — Z79899 Other long term (current) drug therapy: Secondary | ICD-10-CM

## 2015-01-03 DIAGNOSIS — I2510 Atherosclerotic heart disease of native coronary artery without angina pectoris: Secondary | ICD-10-CM | POA: Diagnosis present

## 2015-01-03 DIAGNOSIS — F329 Major depressive disorder, single episode, unspecified: Secondary | ICD-10-CM | POA: Diagnosis present

## 2015-01-03 DIAGNOSIS — Z9842 Cataract extraction status, left eye: Secondary | ICD-10-CM

## 2015-01-03 DIAGNOSIS — M1711 Unilateral primary osteoarthritis, right knee: Secondary | ICD-10-CM | POA: Diagnosis not present

## 2015-01-03 DIAGNOSIS — Z885 Allergy status to narcotic agent status: Secondary | ICD-10-CM

## 2015-01-03 DIAGNOSIS — Z9181 History of falling: Secondary | ICD-10-CM

## 2015-01-03 DIAGNOSIS — Z96651 Presence of right artificial knee joint: Secondary | ICD-10-CM | POA: Diagnosis present

## 2015-01-03 DIAGNOSIS — K219 Gastro-esophageal reflux disease without esophagitis: Secondary | ICD-10-CM | POA: Diagnosis present

## 2015-01-03 DIAGNOSIS — K589 Irritable bowel syndrome without diarrhea: Secondary | ICD-10-CM | POA: Diagnosis present

## 2015-01-03 DIAGNOSIS — Z880 Allergy status to penicillin: Secondary | ICD-10-CM

## 2015-01-03 DIAGNOSIS — Z87891 Personal history of nicotine dependence: Secondary | ICD-10-CM | POA: Diagnosis not present

## 2015-01-03 DIAGNOSIS — G2581 Restless legs syndrome: Secondary | ICD-10-CM | POA: Diagnosis present

## 2015-01-03 DIAGNOSIS — E538 Deficiency of other specified B group vitamins: Secondary | ICD-10-CM | POA: Diagnosis present

## 2015-01-03 DIAGNOSIS — R200 Anesthesia of skin: Secondary | ICD-10-CM

## 2015-01-03 DIAGNOSIS — R42 Dizziness and giddiness: Secondary | ICD-10-CM

## 2015-01-03 DIAGNOSIS — I951 Orthostatic hypotension: Secondary | ICD-10-CM | POA: Diagnosis not present

## 2015-01-03 DIAGNOSIS — M8580 Other specified disorders of bone density and structure, unspecified site: Secondary | ICD-10-CM | POA: Diagnosis present

## 2015-01-03 DIAGNOSIS — Z8249 Family history of ischemic heart disease and other diseases of the circulatory system: Secondary | ICD-10-CM | POA: Diagnosis not present

## 2015-01-03 DIAGNOSIS — M47816 Spondylosis without myelopathy or radiculopathy, lumbar region: Secondary | ICD-10-CM | POA: Diagnosis not present

## 2015-01-03 DIAGNOSIS — M5136 Other intervertebral disc degeneration, lumbar region: Secondary | ICD-10-CM | POA: Diagnosis not present

## 2015-01-03 DIAGNOSIS — K5900 Constipation, unspecified: Secondary | ICD-10-CM | POA: Diagnosis not present

## 2015-01-03 DIAGNOSIS — I10 Essential (primary) hypertension: Secondary | ICD-10-CM | POA: Diagnosis not present

## 2015-01-03 DIAGNOSIS — Z8546 Personal history of malignant neoplasm of prostate: Secondary | ICD-10-CM

## 2015-01-03 DIAGNOSIS — E785 Hyperlipidemia, unspecified: Secondary | ICD-10-CM | POA: Diagnosis present

## 2015-01-03 DIAGNOSIS — G629 Polyneuropathy, unspecified: Secondary | ICD-10-CM | POA: Diagnosis present

## 2015-01-03 DIAGNOSIS — Z79891 Long term (current) use of opiate analgesic: Secondary | ICD-10-CM

## 2015-01-03 DIAGNOSIS — R404 Transient alteration of awareness: Secondary | ICD-10-CM | POA: Diagnosis not present

## 2015-01-03 DIAGNOSIS — R270 Ataxia, unspecified: Secondary | ICD-10-CM | POA: Diagnosis not present

## 2015-01-03 LAB — COMPREHENSIVE METABOLIC PANEL
ALT: 11 U/L — ABNORMAL LOW (ref 17–63)
AST: 15 U/L (ref 15–41)
Albumin: 3.4 g/dL — ABNORMAL LOW (ref 3.5–5.0)
Alkaline Phosphatase: 68 U/L (ref 38–126)
Anion gap: 7 (ref 5–15)
BUN: 17 mg/dL (ref 6–20)
CALCIUM: 8.6 mg/dL — AB (ref 8.9–10.3)
CO2: 27 mmol/L (ref 22–32)
CREATININE: 1.12 mg/dL (ref 0.61–1.24)
Chloride: 107 mmol/L (ref 101–111)
GFR calc Af Amer: >60 mL/min (ref >60)
GFR, EST NON AFRICAN AMERICAN: 59 mL/min — AB (ref >60)
Glucose, Bld: 104 mg/dL — ABNORMAL HIGH (ref 65–99)
Potassium: 3.6 mmol/L (ref 3.5–5.1)
Sodium: 141 mmol/L (ref 135–145)
TOTAL PROTEIN: 6.1 g/dL — AB (ref 6.5–8.1)
Total Bilirubin: 0.7 mg/dL (ref 0.3–1.2)

## 2015-01-03 LAB — CBC WITH DIFFERENTIAL/PLATELET
BASOS ABS: 0 10*3/uL (ref 0.0–0.1)
BASOS PCT: 0 % (ref 0–1)
EOS PCT: 2 % (ref 0–5)
Eosinophils Absolute: 0.1 10*3/uL (ref 0.0–0.7)
HEMATOCRIT: 30.7 % — AB (ref 39.0–52.0)
HEMOGLOBIN: 10 g/dL — AB (ref 13.0–17.0)
Lymphocytes Relative: 15 % (ref 12–46)
Lymphs Abs: 0.8 10*3/uL (ref 0.7–4.0)
MCH: 30.5 pg (ref 26.0–34.0)
MCHC: 32.6 g/dL (ref 30.0–36.0)
MCV: 93.6 fL (ref 78.0–100.0)
MONO ABS: 0.4 10*3/uL (ref 0.1–1.0)
MONOS PCT: 7 % (ref 3–12)
NEUTROS ABS: 4.4 10*3/uL (ref 1.7–7.7)
Neutrophils Relative %: 76 % (ref 43–77)
Platelets: 142 10*3/uL — ABNORMAL LOW (ref 150–400)
RBC: 3.28 MIL/uL — ABNORMAL LOW (ref 4.22–5.81)
RDW: 12.9 % (ref 11.5–15.5)
WBC: 5.7 10*3/uL (ref 4.0–10.5)

## 2015-01-03 LAB — CBC
HCT: 30 % — ABNORMAL LOW (ref 39.0–52.0)
Hemoglobin: 9.7 g/dL — ABNORMAL LOW (ref 13.0–17.0)
MCH: 30.1 pg (ref 26.0–34.0)
MCHC: 32.3 g/dL (ref 30.0–36.0)
MCV: 93.2 fL (ref 78.0–100.0)
PLATELETS: 144 10*3/uL — AB (ref 150–400)
RBC: 3.22 MIL/uL — AB (ref 4.22–5.81)
RDW: 12.9 % (ref 11.5–15.5)
WBC: 4.9 10*3/uL (ref 4.0–10.5)

## 2015-01-03 LAB — URINALYSIS, ROUTINE W REFLEX MICROSCOPIC
Bilirubin Urine: NEGATIVE
GLUCOSE, UA: NEGATIVE mg/dL
Hgb urine dipstick: NEGATIVE
Ketones, ur: NEGATIVE mg/dL
Leukocytes, UA: NEGATIVE
NITRITE: NEGATIVE
Protein, ur: NEGATIVE mg/dL
Specific Gravity, Urine: 1.014 (ref 1.005–1.030)
Urobilinogen, UA: 0.2 mg/dL (ref 0.0–1.0)
pH: 5.5 (ref 5.0–8.0)

## 2015-01-03 LAB — CREATININE, SERUM
Creatinine, Ser: 1.11 mg/dL (ref 0.61–1.24)
GFR calc Af Amer: >60 mL/min (ref >60)
GFR calc non Af Amer: 59 mL/min — ABNORMAL LOW (ref >60)

## 2015-01-03 LAB — I-STAT TROPONIN, ED: TROPONIN I, POC: 0.01 ng/mL (ref 0.00–0.08)

## 2015-01-03 LAB — TROPONIN I: Troponin I: <0.03 ng/mL (ref <0.031)

## 2015-01-03 MED ORDER — SORBITOL 70 % SOLN
30.0000 mL | Freq: Every day | Status: DC | PRN
  Filled 2015-01-03: qty 30

## 2015-01-03 MED ORDER — ALENDRONATE SODIUM 70 MG PO TABS: 70.0000 mg | ORAL_TABLET | ORAL | Status: DC

## 2015-01-03 MED ORDER — MORPHINE SULFATE 2 MG/ML IJ SOLN: 2.0000 mg | INTRAMUSCULAR | Status: DC | PRN

## 2015-01-03 MED ORDER — ONDANSETRON HCL 4 MG PO TABS: 4.0000 mg | ORAL_TABLET | Freq: Four times a day (QID) | ORAL | Status: DC | PRN

## 2015-01-03 MED ORDER — SODIUM CHLORIDE 0.9 % IV BOLUS (SEPSIS)
1000.0000 mL | Freq: Once | INTRAVENOUS | Status: AC
  Administered 2015-01-03: 1000 mL via INTRAVENOUS

## 2015-01-03 MED ORDER — ONDANSETRON HCL 4 MG/2ML IJ SOLN: 4.0000 mg | Freq: Four times a day (QID) | INTRAMUSCULAR | Status: DC | PRN

## 2015-01-03 MED ORDER — SODIUM CHLORIDE 0.9 % IV SOLN
Freq: Once | INTRAVENOUS | Status: AC
  Administered 2015-01-03: 13:00:00 via INTRAVENOUS

## 2015-01-03 MED ORDER — HEPARIN SODIUM (PORCINE) 5000 UNIT/ML IJ SOLN
5000.0000 [IU] | Freq: Three times a day (TID) | INTRAMUSCULAR | Status: DC
  Administered 2015-01-03 – 2015-01-04 (×3): 5000 [IU] via SUBCUTANEOUS
  Filled 2015-01-03 (×5): qty 1

## 2015-01-03 MED ORDER — SODIUM CHLORIDE 0.9 % IV SOLN
INTRAVENOUS | Status: DC
  Administered 2015-01-03: 100 mL/h via INTRAVENOUS
  Administered 2015-01-04: 05:00:00 via INTRAVENOUS

## 2015-01-03 MED ORDER — SODIUM CHLORIDE 0.9 % IJ SOLN: 3.0000 mL | Freq: Two times a day (BID) | INTRAMUSCULAR | Status: DC

## 2015-01-03 MED ORDER — SODIUM CHLORIDE 0.9 % IV SOLN: INTRAVENOUS | Status: DC

## 2015-01-03 MED ORDER — ACETAMINOPHEN 650 MG RE SUPP: 650.0000 mg | Freq: Four times a day (QID) | RECTAL | Status: DC | PRN

## 2015-01-03 MED ORDER — ASPIRIN EC 325 MG PO TBEC
325.0000 mg | DELAYED_RELEASE_TABLET | Freq: Two times a day (BID) | ORAL | Status: DC
  Administered 2015-01-03 – 2015-01-04 (×2): 325 mg via ORAL
  Filled 2015-01-03 (×4): qty 1

## 2015-01-03 MED ORDER — METHOCARBAMOL 500 MG PO TABS: 500.0000 mg | ORAL_TABLET | Freq: Four times a day (QID) | ORAL | Status: DC | PRN

## 2015-01-03 MED ORDER — HYDROCODONE-ACETAMINOPHEN 5-325 MG PO TABS
1.0000 | ORAL_TABLET | ORAL | Status: DC | PRN
  Administered 2015-01-03 – 2015-01-04 (×2): 2 via ORAL
  Filled 2015-01-03 (×2): qty 2

## 2015-01-03 MED ORDER — PROPRANOLOL HCL 10 MG PO TABS
20.0000 mg | ORAL_TABLET | Freq: Two times a day (BID) | ORAL | Status: DC
  Administered 2015-01-03 – 2015-01-04 (×2): 20 mg via ORAL
  Filled 2015-01-03: qty 1
  Filled 2015-01-03 (×2): qty 2

## 2015-01-03 MED ORDER — GABAPENTIN 300 MG PO CAPS
300.0000 mg | ORAL_CAPSULE | Freq: Two times a day (BID) | ORAL | Status: DC
  Administered 2015-01-03 – 2015-01-04 (×2): 300 mg via ORAL
  Filled 2015-01-03 (×2): qty 1

## 2015-01-03 MED ORDER — ACETAMINOPHEN 325 MG PO TABS: 650.0000 mg | ORAL_TABLET | Freq: Four times a day (QID) | ORAL | Status: DC | PRN

## 2015-01-03 MED ORDER — ONDANSETRON HCL 4 MG/2ML IJ SOLN: 4.0000 mg | Freq: Three times a day (TID) | INTRAMUSCULAR | Status: DC | PRN

## 2015-01-03 MED ORDER — SIMVASTATIN 20 MG PO TABS
20.0000 mg | ORAL_TABLET | Freq: Every evening | ORAL | Status: DC
  Administered 2015-01-03: 20 mg via ORAL
  Filled 2015-01-03 (×2): qty 1

## 2015-01-03 NOTE — ED Notes (Signed)
Patient unable to void 

## 2015-01-03 NOTE — ED Notes (Signed)
Delay in lab draw,  edp examining pt. 

## 2015-01-03 NOTE — ED Notes (Signed)
Ambulated patient and he did not do well, swayed three times before entering hall way

## 2015-01-03 NOTE — ED Notes (Signed)
Unable to void

## 2015-01-03 NOTE — ED Notes (Addendum)
Pt in from home. Felt dizzy after taking shower. R TKR one month ago. Pt did not lose consciousness or fall. Pt felt slightly better after sitting down but symptoms remain. Additionally, toes on both feet tingling since this am. EMS IV 20g R lat wrist. 12 lead by ems unremarkable.

## 2015-01-03 NOTE — Progress Notes (Signed)
Utilization Review completed. Kydan Shanholtzer RN BSN CM 

## 2015-01-03 NOTE — ED Notes (Signed)
Pt returned from CT °

## 2015-01-03 NOTE — ED Notes (Addendum)
Pt reports getting up, taking shower, getting dressed then beginning to feel "terrible." C/o dizziness at first but then sts this isn't the right word and sts he is lightheaded. Unsure if he feels worse with position changes, NT has performed orthostatic VS, but pt sts he cannot recall if he felt worse or not. Pt sts he feels mostly back to normal except in his head, "just feels bad" unable to elaborate. Pt also reported to ems tingling in his toes this am. Pedal pulses present, RN questioned pt about this and he stated they felt back to normal but then directly after said his toes did not feel normal "like they didn't belong to me this morning, feels there is something inside all of them, they feel very different, I don't know what the difference is." Again, unable to elaborate on this description. Hx of peripheral neuropathy, sts this feels different. Pt denies pain, HA. Stroke screen neg. Has a Hx of vertig, sts this does not feel like vertigo.

## 2015-01-03 NOTE — H&P (Signed)
[facility] History and Physical  Ryan Humphrey TLX:[id number] DOB: 12-16-1928 DOA: 01/03/2015  Referring physician: Emergency Department PCP: Dorian Heckle, MD  Specialists:   Chief Complaint: dizziness  HPI: Ryan Humphrey is a 81 y.o. male  With a hx of knee replacement, osteoarthritis, neuropathy who presents from home with dizziness and falls. Pt reports decreased PO intake prior to admission. In the ed pt noted to be orthostatic and was started on IVF. Pt also noted B toe pains lumbar xray with findings of DJD. Hospitalist consulted for admission  Review of Systems:  Review of Systems  Constitutional: Negative for fever and chills.  HENT: Negative for ear discharge and ear pain.   Eyes: Negative for pain and discharge.  Respiratory: Negative for sputum production and shortness of breath.   Cardiovascular: Negative for chest pain and palpitations.  Gastrointestinal: Negative for nausea, vomiting and abdominal pain.  Genitourinary: Negative for frequency.  Musculoskeletal: Positive for falls. Negative for back pain and neck pain.  Neurological: Positive for dizziness. Negative for tingling, tremors, seizures and loss of consciousness.  Psychiatric/Behavioral: Negative for memory loss. The patient is not nervous/anxious.      Past Medical History  Diagnosis Date  . Neuropathy   . Hyperlipidemia   . Arthritis     arthritis,osteopenia,"spinal stenosis"  . GERD (gastroesophageal reflux disease)   . H/O hiatal hernia   . H/O vertigo 12-06-12    none recent  . Anemia   . Depression   . Cancer 12-06-12    Prostate cancer'98  . Peripheral neuropathy 12-06-12    peripheral neuropathy  . RLS (restless legs syndrome)   . Rhinitis   . CAD (coronary artery disease)   . Osteopenia   . Vitamin B12 deficiency   . IBS (irritable bowel syndrome)    Past Surgical History  Procedure Laterality Date  . Appendectomy    . Cholecystectomy    . Carpal tunnel release      both  hands  . Prostate surgery  12-06-12  . Cataract surgery Left 12-06-12    recent surgery  . Esophagogastroduodenoscopy (egd) with propofol N/A 12/25/2012    Procedure: ESOPHAGOGASTRODUODENOSCOPY (EGD) WITH PROPOFOL;  Surgeon: Garlan Fair, MD;  Location: [facility];  Service: Endoscopy;  Laterality: N/A;  . Colonoscopy with propofol N/A 12/25/2012    Procedure: COLONOSCOPY WITH PROPOFOL;  Surgeon: Garlan Fair, MD;  Location: [facility];  Service: Endoscopy;  Laterality: N/A;  . Tonsillectomy    . Total knee arthroplasty Right 11/25/2014    Procedure: RIGHT TOTAL KNEE ARTHROPLASTY;  Surgeon: Melrose Nakayama, MD;  Location: [place];  Service: Orthopedics;  Laterality: Right;   Social History:  reports that he quit smoking about 51 years ago. He has never used smokeless tobacco. He reports that he does not drink alcohol or use illicit drugs.  where does patient live--home, ALF, SNF? and with whom if at home?  Can patient participate in ADLs?  Allergies  Allergen Reactions  . Penicillins Other (See Comments)    "rash"  . Tramadol Itching    Family History  Problem Relation Age of Onset  . CAD Father     Deceased, 1  . Dementia Mother     Deceased, 79  . Colon cancer Neg Hx   . Colon polyps Neg Hx   . Kidney disease Neg Hx   . Diabetes Neg Hx   . Esophageal cancer Neg Hx   . Gallbladder disease Neg Hx     (  be sure to complete)  Prior to Admission medications   Medication Sig Start Date End Date Taking? Authorizing Provider  alendronate (FOSAMAX) 70 MG tablet Take 70 mg by mouth every Saturday. Take with a full glass of water on an empty stomach.   Yes Historical Provider, MD  Calcium Carbonate-Vitamin D (CALTRATE 600+D PO) Take 1 tablet by mouth daily.    Yes Historical Provider, MD  Cholecalciferol (VITAMIN D) 2000 UNITS tablet Take 2,000 Units by mouth daily.   Yes Historical Provider, MD  cyanocobalamin (,VITAMIN B-12,) 1000 MCG/ML injection Inject 1,000 mcg into the  muscle every 30 (thirty) days.    Yes Historical Provider, MD  diphenoxylate-atropine (LOMOTIL) 2.5-0.025 MG per tablet Take 1 tablet by mouth 4 (four) times daily as needed for diarrhea or loose stools.   Yes Historical Provider, MD  gabapentin (NEURONTIN) 300 MG capsule Take 300 mg by mouth 2 (two) times daily.    Yes Historical Provider, MD  HYDROcodone-acetaminophen (NORCO/VICODIN) 5-325 MG per tablet Take 1-2 tablets by mouth every 4 (four) hours as needed (breakthrough pain). 11/27/14  Yes Loni Dolly, PA-C  methocarbamol (ROBAXIN) 500 MG tablet Take 1 tablet (500 mg total) by mouth every 6 (six) hours as needed for muscle spasms. 11/27/14  Yes Loni Dolly, PA-C  propranolol (INDERAL) 20 MG tablet Take 1 tablet (20 mg total) by mouth 2 (two) times daily. 04/29/14  Yes Donika K Patel, DO  simvastatin (ZOCOR) 20 MG tablet Take 20 mg by mouth every evening.   Yes Historical Provider, MD  VITAMIN E PO Take 1 tablet by mouth daily.   Yes Historical Provider, MD  aspirin EC 325 MG EC tablet Take 1 tablet (325 mg total) by mouth 2 (two) times daily after a meal. Patient not taking: Reported on 01/03/2015 11/27/14   Loni Dolly, PA-C  sertraline (ZOLOFT) 100 MG tablet Take 100 mg by mouth daily.    Historical Provider, MD  traMADol (ULTRAM) 50 MG tablet Take two tablets by mouth every 4 hours as needed for pain. Hold for sedation Patient not taking: Reported on 01/03/2015 12/01/14   Gayland Curry, DO   Physical Exam: Filed Vitals:   01/03/15 1100 01/03/15 1300 01/03/15 1400 01/03/15 1630  BP:    165/64  Pulse: 89 90 89 84  Temp:      TempSrc:      Resp: 20 20 12 14   Height:      Weight:      SpO2: 100% 100% 100% 100%     General:  Awake, in nad  Eyes: PERRL B  ENT: membranes dry, dentition fair  Neck: trachea midline, neck supple  Cardiovascular: regular, s1, s2  Respiratory: normal resp effort, no wheezing  Abdomen: soft,nondistended  Skin: decreased skin turgor, no abnormal skin  lesions seen  Musculoskeletal: perfused, no clubbing  Psychiatric: mood/affect normal// no auditory/visual hallucinations  Neurologic: cn2-12 grossly intact, strength/sensation intact  Labs on Admission:  Basic Metabolic Panel:  Recent Labs Lab 01/03/15 1207  NA 141  K 3.6  CL 107  CO2 27  GLUCOSE 104*  BUN 17  CREATININE 1.12  CALCIUM 8.6*   Liver Function Tests:  Recent Labs Lab 01/03/15 1207  AST 15  ALT 11*  ALKPHOS 68  BILITOT 0.7  PROT 6.1*  ALBUMIN 3.4*   No results for input(s): LIPASE, AMYLASE in the last 168 hours. No results for input(s): AMMONIA in the last 168 hours. CBC:  Recent Labs Lab 01/03/15 1207  WBC 5.7  NEUTROABS 4.4  HGB 10.0*  HCT 30.7*  MCV 93.6  PLT 142*   Cardiac Enzymes: No results for input(s): CKTOTAL, CKMB, CKMBINDEX, TROPONINI in the last 168 hours.  BNP (last 3 results) No results for input(s): BNP in the last 8760 hours.  ProBNP (last 3 results) No results for input(s): PROBNP in the last 8760 hours.  CBG: No results for input(s): GLUCAP in the last 168 hours.  Radiological Exams on Admission: Dg Lumbar Spine Complete  01/03/2015   CLINICAL DATA:  Patient fell approximately 1 month ago and injured the low back with bruising. Patient states the pain has subsided, but there is persistent numbness in both feet. Personal history of prostate cancer. Initial encounter.  EXAM: LUMBAR SPINE - COMPLETE 4+ VIEW  COMPARISON:  None.  FINDINGS: Five non rib-bearing lumbar vertebrae with anatomic alignment. No fractures. Disc space narrowing and endplate hypertrophic changes at every lumbar level, worst at L1- 2 and L4-5. Facet degenerative changes at every level. Visualized sacroiliac joints intact. No evidence of osseous metastatic disease. Aortoiliac atherosclerosis without aneurysm.  IMPRESSION: 1. No acute or subacute osseous abnormality. 2. Multilevel degenerative disc disease and spondylosis, worst at L1- 2 and L4-5. Multilevel  facet degenerative changes. 3. No evidence of osseous metastatic disease.   Electronically Signed   By: Evangeline Dakin M.D.   On: 01/03/2015 13:17   Ct Head Wo Contrast  01/03/2015   CLINICAL DATA:  Dizziness and ataxia. Tingling involving the toes of both feet since this morning.  EXAM: CT HEAD WITHOUT CONTRAST  TECHNIQUE: Contiguous axial images were obtained from the base of the skull through the vertex without intravenous contrast.  COMPARISON:  09/17/2010  FINDINGS: Moderate cerebral atrophy is unchanged. Periventricular white-matter hypodensities are similar to the prior study and nonspecific but compatible with mild chronic small vessel ischemic disease.  Prior bilateral cataract extraction is noted. Mild posterior right ethmoid air cell mucosal thickening is noted. Mastoid air cells are clear. 10 mm densely sclerotic focus at the superior aspect of the left occipital condyles unchanged, likely a bone island.  IMPRESSION: No evidence of acute intracranial abnormality.   Electronically Signed   By: Logan Bores   On: 01/03/2015 16:37    Assessment/Plan Principal Problem:   Orthostatic hypotension Active Problems:   HYPERTENSION, BENIGN   CORONARY ATHEROSCLEROSIS NATIVE CORONARY ARTERY   Primary osteoarthritis of right knee   HLD (hyperlipidemia)   Dizziness   1. Orthostatic hypotension 1. Greater than 34HD drop in systolic bp from laying to sitting 2. Mucus membranes very dry with decreased skin turgor 3. Suspect secondary to marked dehydration from poor PO intake 4. Admit to med-tele 5. Cont with IVF as tolerated 2. HTN 1. BP stable for now 2. Cont monitor 3. CAD 1. No chest pain currently 2. Cont home meds 4. Osteoarthritis, s/p knee replacement 1. Stable 2. Will consult PT/OT 5. HLD 1. Cont statin 6. Dizziness 1. Secondary to dehydration per above 2. PT/OT 7. DVT prophylaxis 1. Heparin subQ  Code Status: Full (must indicate code status--if unknown or must be  presumed, indicate so) Family Communication: Pt in room, family at bedside (indicate person spoken with, if applicable, with phone number if by telephone) Disposition Plan: admit med-tele (indicate anticipated LOS)   CHIU, STEPHEN K [facility] Pager [phone]  If 7PM-7AM, please contact night-coverage [email or web address] Password [facility] 01/03/2015, 4:47 PM

## 2015-01-03 NOTE — ED Notes (Signed)
Bed: VZ48 Expected date: 01/03/15 Expected time: 9:51 AM Means of arrival: Ambulance Comments: Weakness after shower

## 2015-01-03 NOTE — Progress Notes (Signed)
PHARMACIST - PHYSICIAN COMMUNICATION  CONCERNING: P&T Medication Policy Regarding Oral Bisphosphonates  RECOMMENDATION: Your order for alendronate (Fosamax), ibandronate (Boniva), or risedronate (Actonel) has been discontinued at this time.  If the patient's post-hospital medical condition warrants safe use of this class of drugs, please resume the pre-hospital regimen upon discharge.  DESCRIPTION:  Alendronate (Fosamax), ibandronate (Boniva), and risedronate (Actonel) can cause severe esophageal erosions in patients who are unable to remain upright at least 30 minutes after taking this medication.   Since brief interruptions in therapy are thought to have minimal impact on bone mineral density, the [place] has established that bisphosphonate orders should be routinely discontinued during hospitalization.   To override this safety policy and permit administration of Boniva, Fosamax, or Actonel in the hospital, prescribers must write "DO NOT HOLD" in the comments section when placing the order for this class of medications.   Gretta Arab PharmD, BCPS Pager [phone] 01/03/2015 4:41 PM

## 2015-01-03 NOTE — ED Provider Notes (Signed)
CSN: [id number]     Arrival date & time 01/03/15  9702 History   First MD Initiated Contact with Patient 01/03/15 1030     Chief Complaint  Patient presents with  . Dizziness     (Consider location/radiation/quality/duration/timing/severity/associated sxs/prior Treatment) HPI Ryan Humphrey is a 81 y.o. male with hx of neuropathy, hyperlipidemia, GERD, vertigo, CAD, presents to ED with complaint of weakness. Patient states he was in the shower, states started having bilateral toe numbness. States "it felt like there is no not my toes." He states once he got out of the shower he started having generalized weakness and lightheadedness. He states "I just felt bad". Patient called his daughter who states that he told her that his legs were going to give out. She instructed him to sit down and wait for her to come. Patient was taken to local fire department in EMS was called to transfer patient over here. Patient had recent right knee replacement on 11/25/14. Since then he has been doing well, ambulating with no difficulty, doing physical therapy. He continues to have some pain in the knee, and some swelling. He states his last physical therapy session was 2 days ago and states he was more tired after the normal. Patient denies any pain at this time. He denies any stress pain or shortness of breath. He denies headache. He denies any dizziness lying down. He is unable to recall if standing up makes his dizziness worse. He took his medicine last night, but has not taken his morning doses here. He admits to poor appetite since his surgery and 20 pound weight loss in the last month.  Past Medical History  Diagnosis Date  . Neuropathy   . Hyperlipidemia   . Arthritis     arthritis,osteopenia,"spinal stenosis"  . GERD (gastroesophageal reflux disease)   . H/O hiatal hernia   . H/O vertigo 12-06-12    none recent  . Anemia   . Depression   . Cancer 12-06-12    Prostate cancer'98  . Peripheral neuropathy  12-06-12    peripheral neuropathy  . RLS (restless legs syndrome)   . Rhinitis   . CAD (coronary artery disease)   . Osteopenia   . Vitamin B12 deficiency   . IBS (irritable bowel syndrome)    Past Surgical History  Procedure Laterality Date  . Appendectomy    . Cholecystectomy    . Carpal tunnel release      both hands  . Prostate surgery  12-06-12  . Cataract surgery Left 12-06-12    recent surgery  . Esophagogastroduodenoscopy (egd) with propofol N/A 12/25/2012    Procedure: ESOPHAGOGASTRODUODENOSCOPY (EGD) WITH PROPOFOL;  Surgeon: Garlan Fair, MD;  Location: [facility];  Service: Endoscopy;  Laterality: N/A;  . Colonoscopy with propofol N/A 12/25/2012    Procedure: COLONOSCOPY WITH PROPOFOL;  Surgeon: Garlan Fair, MD;  Location: [facility];  Service: Endoscopy;  Laterality: N/A;  . Tonsillectomy    . Total knee arthroplasty Right 11/25/2014    Procedure: RIGHT TOTAL KNEE ARTHROPLASTY;  Surgeon: Melrose Nakayama, MD;  Location: [place];  Service: Orthopedics;  Laterality: Right;   Family History  Problem Relation Age of Onset  . CAD Father     Deceased, 54  . Dementia Mother     Deceased, 40  . Colon cancer Neg Hx   . Colon polyps Neg Hx   . Kidney disease Neg Hx   . Diabetes Neg Hx   . Esophageal cancer Neg Hx   .  Gallbladder disease Neg Hx    History  Substance Use Topics  . Smoking status: Former Smoker -- 0.50 packs/day for 30 years    Quit date: 06/14/1963  . Smokeless tobacco: Never Used  . Alcohol Use: No    Review of Systems  Constitutional: Positive for fatigue. Negative for fever and chills.  Respiratory: Negative for cough, chest tightness and shortness of breath.   Cardiovascular: Negative for chest pain, palpitations and leg swelling.  Gastrointestinal: Negative for nausea, vomiting, abdominal pain, diarrhea and abdominal distention.  Genitourinary: Negative for dysuria, urgency, frequency and hematuria.  Musculoskeletal: Negative for myalgias,  arthralgias, neck pain and neck stiffness.  Skin: Negative for rash.  Allergic/Immunologic: Negative for immunocompromised state.  Neurological: Positive for weakness and numbness. Negative for dizziness, light-headedness and headaches.  All other systems reviewed and are negative.     Allergies  Penicillins and Tramadol  Home Medications   Prior to Admission medications   Medication Sig Start Date End Date Taking? Authorizing Provider  alendronate (FOSAMAX) 70 MG tablet Take 70 mg by mouth every Saturday. Take with a full glass of water on an empty stomach.   Yes Historical Provider, MD  Calcium Carbonate-Vitamin D (CALTRATE 600+D PO) Take 1 tablet by mouth daily.    Yes Historical Provider, MD  Cholecalciferol (VITAMIN D) 2000 UNITS tablet Take 2,000 Units by mouth daily.   Yes Historical Provider, MD  cyanocobalamin (,VITAMIN B-12,) 1000 MCG/ML injection Inject 1,000 mcg into the muscle every 30 (thirty) days.    Yes Historical Provider, MD  diphenoxylate-atropine (LOMOTIL) 2.5-0.025 MG per tablet Take 1 tablet by mouth 4 (four) times daily as needed for diarrhea or loose stools.   Yes Historical Provider, MD  gabapentin (NEURONTIN) 300 MG capsule Take 300 mg by mouth 2 (two) times daily.    Yes Historical Provider, MD  HYDROcodone-acetaminophen (NORCO/VICODIN) 5-325 MG per tablet Take 1-2 tablets by mouth every 4 (four) hours as needed (breakthrough pain). 11/27/14  Yes Loni Dolly, PA-C  methocarbamol (ROBAXIN) 500 MG tablet Take 1 tablet (500 mg total) by mouth every 6 (six) hours as needed for muscle spasms. 11/27/14  Yes Loni Dolly, PA-C  propranolol (INDERAL) 20 MG tablet Take 1 tablet (20 mg total) by mouth 2 (two) times daily. 04/29/14  Yes Donika K Patel, DO  simvastatin (ZOCOR) 20 MG tablet Take 20 mg by mouth every evening.   Yes Historical Provider, MD  VITAMIN E PO Take 1 tablet by mouth daily.   Yes Historical Provider, MD  aspirin EC 325 MG EC tablet Take 1 tablet (325 mg  total) by mouth 2 (two) times daily after a meal. Patient not taking: Reported on 01/03/2015 11/27/14   Loni Dolly, PA-C  sertraline (ZOLOFT) 100 MG tablet Take 100 mg by mouth daily.    Historical Provider, MD  traMADol (ULTRAM) 50 MG tablet Take two tablets by mouth every 4 hours as needed for pain. Hold for sedation Patient not taking: Reported on 01/03/2015 12/01/14   Tiffany L Reed, DO   BP 149/68 mmHg  Pulse 88  Temp(Src) 98.7 F (37.1 C) (Oral)  Resp 16  Ht 5\' 11"  (1.803 m)  Wt 160 lb (72.576 kg)  BMI 22.33 kg/m2  SpO2 100% Physical Exam  Constitutional: He is oriented to person, place, and time. He appears well-developed and well-nourished. No distress.  HENT:  Head: Normocephalic and atraumatic.  Oral mucosa dry  Eyes: Conjunctivae and EOM are normal. Pupils are equal, round, and reactive to  light.  Neck: Neck supple.  Cardiovascular: Normal rate, regular rhythm and normal heart sounds.   Pulmonary/Chest: Effort normal. No respiratory distress. He has no wheezes. He has no rales.  Abdominal: Soft. Bowel sounds are normal. He exhibits no distension. There is no tenderness. There is no rebound.  Musculoskeletal: He exhibits no edema.  Feet are pink and warm bilaterally. cap refill <2 sec. DP pulses intact. Right knee swelling present. Diffusely tender. Old bruise noted to the right flank. No midline lumbar spine tenderness  Neurological: He is alert and oriented to person, place, and time. No cranial nerve deficit. Coordination normal.  Sensation in toes intact. 5/5 and equal upper and lower extremity strength bilaterally. Equal grip strength bilaterally. Normal finger to nose and heel to shin. No pronator drift. Patellar reflexes 2+   Skin: Skin is warm and dry.  Psychiatric: He has a normal mood and affect. His behavior is normal.  Nursing note and vitals reviewed.   ED Course  Procedures (including critical care time) Labs Review Labs Reviewed  CBC WITH  DIFFERENTIAL/PLATELET  COMPREHENSIVE METABOLIC PANEL  URINALYSIS, ROUTINE W REFLEX MICROSCOPIC (NOT AT [facility])  I-STAT TROPOININ, ED    Imaging Review No results found.   EKG Interpretation   Date/Time:  Saturday January 03 2015 10:03:26 EDT Ventricular Rate:  87 PR Interval:  160 QRS Duration: 141 QT Interval:  426 QTC Calculation: 512 R Axis:   34 Text Interpretation:  Sinus rhythm Atrial premature complex Right bundle  branch block No significant change was found Confirmed by Wyvonnia Dusky  MD,  STEPHEN ([id number]) on 01/03/2015 10:15:13 AM      MDM   Final diagnoses:  Dizziness  Numbness of toes  Dehydration    patient emergency department with dizziness and numbness to bilateral toes. He denies sensation of room spinning, states that he feels like he is just going to fall and pass out. Will get labs, at this time he is neurovascularly intact, no focal deficits.   Orthostatic vs showed BP drop greater than 20 systolic upon standing. IV fluids started. Question dehydration. No significant lab abnormalities. UA pending. Pt unable to void while in ED. VS normal at this time.   Pt hydrated, continues to have toe numbness. Tried to ambulate, unable to walk on his own. Leaning to the side and grabs to the surroundings. Denies vertigo symptoms. Will need admission for further hydration and testing.   Discussed with hospitalist who will admit pt.   Filed Vitals:   01/03/15 1630 01/03/15 1743 01/03/15 2135 01/04/15 0505  BP: 165/64 137/64 125/57 108/51  Pulse: 84 89 88 69  Temp:  98.6 F (37 C) 98.1 F (36.7 C) 97.5 F (36.4 C)  TempSrc:  Oral Oral Oral  Resp: 14  12 12   Height:      Weight:      SpO2: 100% 99% 98% 97%      Jeannett Senior, PA-C 01/04/15 0715  Malvin Johns, MD 01/04/15 1534

## 2015-01-04 ENCOUNTER — Inpatient Hospital Stay (HOSPITAL_COMMUNITY): Payer: Medicare Other

## 2015-01-04 LAB — CBC
HCT: 28.3 % — ABNORMAL LOW (ref 39.0–52.0)
Hemoglobin: 9 g/dL — ABNORMAL LOW (ref 13.0–17.0)
MCH: 30 pg (ref 26.0–34.0)
MCHC: 31.8 g/dL (ref 30.0–36.0)
MCV: 94.3 fL (ref 78.0–100.0)
Platelets: 131 10*3/uL — ABNORMAL LOW (ref 150–400)
RBC: 3 MIL/uL — ABNORMAL LOW (ref 4.22–5.81)
RDW: 13 % (ref 11.5–15.5)
WBC: 4.9 10*3/uL (ref 4.0–10.5)

## 2015-01-04 LAB — COMPREHENSIVE METABOLIC PANEL
ALBUMIN: 2.9 g/dL — AB (ref 3.5–5.0)
ALK PHOS: 61 U/L (ref 38–126)
ALT: 10 U/L — ABNORMAL LOW (ref 17–63)
ANION GAP: 6 (ref 5–15)
AST: 12 U/L — ABNORMAL LOW (ref 15–41)
BUN: 13 mg/dL (ref 6–20)
CO2: 26 mmol/L (ref 22–32)
CREATININE: 1.04 mg/dL (ref 0.61–1.24)
Calcium: 8.1 mg/dL — ABNORMAL LOW (ref 8.9–10.3)
Chloride: 110 mmol/L (ref 101–111)
GFR calc Af Amer: >60 mL/min (ref >60)
GFR calc non Af Amer: >60 mL/min (ref >60)
Glucose, Bld: 96 mg/dL (ref 65–99)
POTASSIUM: 3.7 mmol/L (ref 3.5–5.1)
Sodium: 142 mmol/L (ref 135–145)
TOTAL PROTEIN: 5.3 g/dL — AB (ref 6.5–8.1)
Total Bilirubin: 0.7 mg/dL (ref 0.3–1.2)

## 2015-01-04 MED ORDER — PROPRANOLOL HCL 10 MG PO TABS: 20.0000 mg | ORAL_TABLET | Freq: Two times a day (BID) | ORAL | Status: DC

## 2015-01-04 NOTE — Evaluation (Signed)
Physical Therapy Evaluation Patient Details Name: Ryan Humphrey MRN: [id number] DOB: May 11, 1929 Today's Date: 01/04/2015   History of Present Illness  Pt is a 81 y/o M s/p R TKA.  Pt's PMH includes vertigo, HTN, neuropathy, anemia, depression, restless leg syndrome, osteopenia.admitted 7/23 with orthostatic hypotension, dehydration  Clinical Impression  Patient is mobilizing with single point cane without difficulty. No further  Acute needs. Plans continue OP PT.    Follow Up Recommendations Outpatient PT    Equipment Recommendations  None recommended by PT    Recommendations for Other Services       Precautions / Restrictions Precautions Precautions: Fall;Knee      Mobility  Bed Mobility                  Transfers Overall transfer level: Modified independent Equipment used: Straight cane                Ambulation/Gait Ambulation/Gait assistance: Supervision Ambulation Distance (Feet): 400 Feet Assistive device: Straight cane Gait Pattern/deviations: Step-through pattern     General Gait Details: cues for safety, able to turn and look, no loss of balance  Stairs            Wheelchair Mobility    Modified Rankin (Stroke Patients Only)       Balance                                             Pertinent Vitals/Pain Pain Assessment: No/denies pain    Home Living Family/patient expects to be discharged to:: Private residence Living Arrangements: Alone Available Help at Discharge: Family;Available 24 hours/day Type of Home: House Home Access: Level entry     Home Layout: One level Home Equipment: Cane - single point;Walker - 2 wheels      Prior Function Level of Independence: Independent with assistive device(s)               Hand Dominance        Extremity/Trunk Assessment   Upper Extremity Assessment: Overall WFL for tasks assessed           Lower Extremity Assessment: Overall WFL for tasks  assessed      Cervical / Trunk Assessment: Normal  Communication   Communication: No difficulties  Cognition Arousal/Alertness: Awake/alert Behavior During Therapy: WFL for tasks assessed/performed Overall Cognitive Status: Within Functional Limits for tasks assessed                      General Comments      Exercises        Assessment/Plan    PT Assessment All further PT needs can be met in the next venue of care (OPPT for knee rehab)  PT Diagnosis Difficulty walking   PT Problem List Decreased activity tolerance  PT Treatment Interventions     PT Goals (Current goals can be found in the Care Plan section) Acute Rehab PT Goals Patient Stated Goal: to go home PT Goal Formulation: All assessment and education complete, DC therapy    Frequency     Barriers to discharge        Co-evaluation               End of Session Equipment Utilized During Treatment: Gait belt Activity Tolerance: Patient tolerated treatment well Patient left: in chair;with call bell/phone within reach;with family/visitor present Nurse  Communication: Mobility status         Time: 1200-1217 PT Time Calculation (min) (ACUTE ONLY): 17 min   Charges:   PT Evaluation $Initial PT Evaluation Tier I: 1 Procedure     PT G Codes:        Claretha Cooper 01/04/2015, 1:00 PM Tresa Endo PT [phone]

## 2015-01-04 NOTE — Discharge Summary (Signed)
Physician Discharge Summary  Ryan Humphrey ONG:[id number] DOB: [birth year] DOA: 01/03/2015  PCP: Dorian Heckle, MD  Admit date: 01/03/2015 Discharge date: 01/04/2015  Time spent: 20 minutes  Recommendations for Outpatient Follow-up:  1. Follow up with PCP in 1-2 weeks 2. Patient reminded to stay well hydrated  Discharge Diagnoses:  Principal Problem:   Orthostatic hypotension Active Problems:   HYPERTENSION, BENIGN   CORONARY ATHEROSCLEROSIS NATIVE CORONARY ARTERY   Primary osteoarthritis of right knee   HLD (hyperlipidemia)   Dizziness   Discharge Condition: Improved  Diet recommendation: Heart healthy  Filed Weights   01/03/15 0959  Weight: 72.576 kg (160 lb)    History of present illness:  Please see admit h and p from 7/23 for details. Briefly, pt presented with complaints of dizziness in the setting of poor PO intake. Pt was found to be orthostatic. Pt was admitted for further work up.  Hospital Course:  1. Orthostatic hypotension 1. Greater than 44WN drop in systolic bp from laying to sitting 2. On admit, mucus membranes were very dry with decreased skin turgor 3. Suspect secondary to marked dehydration from poor PO intake 4. Patient was admitted to med-tele 5. Patient was continued with IVF and was no longer orthostatic by the following day. 6. Clinically, the patient reported feeling much improved 2. HTN 1. BP remained stable 2. Cont monitor 3. CAD 1. No chest pains  2. Cont home meds 4. Osteoarthritis, s/p knee replacement 1. Stable 2. PT recommended outpatient PT, which pt is already doing prior to admit 5. HLD 1. Cont statin 6. Dizziness 1. Secondary to dehydration per above 7. DVT prophylaxis 1. Heparin subQ while inpatient  Discharge Exam: Filed Vitals:   01/03/15 1743 01/03/15 2135 01/04/15 0505 01/04/15 1314  BP: 137/64 125/57 108/51 104/48  Pulse: 89 88 69 65  Temp: 98.6 F (37 C) 98.1 F (36.7 C) 97.5 F (36.4 C) 97.5 F (36.4 C)   TempSrc: Oral Oral Oral Oral  Resp:  12 12 18   Height:      Weight:      SpO2: 99% 98% 97% 100%    General: Awake, in nad Cardiovascular: regular, s1, s2 Respiratory: normal resp effort, no wheezing  Discharge Instructions     Medication List    STOP taking these medications        traMADol 50 MG tablet  Commonly known as:  ULTRAM      TAKE these medications        alendronate 70 MG tablet  Commonly known as:  FOSAMAX  Take 70 mg by mouth every Saturday. Take with a full glass of water on an empty stomach.     aspirin 325 MG EC tablet  Take 1 tablet (325 mg total) by mouth 2 (two) times daily after a meal.     CALTRATE 600+D PO  Take 1 tablet by mouth daily.     cyanocobalamin 1000 MCG/ML injection  Commonly known as:  (VITAMIN B-12)  Inject 1,000 mcg into the muscle every 30 (thirty) days.     diphenoxylate-atropine 2.5-0.025 MG per tablet  Commonly known as:  LOMOTIL  Take 1 tablet by mouth 4 (four) times daily as needed for diarrhea or loose stools.     gabapentin 300 MG capsule  Commonly known as:  NEURONTIN  Take 300 mg by mouth 2 (two) times daily.     HYDROcodone-acetaminophen 5-325 MG per tablet  Commonly known as:  NORCO/VICODIN  Take 1-2 tablets by mouth every 4 (  four) hours as needed (breakthrough pain).     methocarbamol 500 MG tablet  Commonly known as:  ROBAXIN  Take 1 tablet (500 mg total) by mouth every 6 (six) hours as needed for muscle spasms.     propranolol 20 MG tablet  Commonly known as:  INDERAL  Take 1 tablet (20 mg total) by mouth 2 (two) times daily.     sertraline 100 MG tablet  Commonly known as:  ZOLOFT  Take 100 mg by mouth daily.     simvastatin 20 MG tablet  Commonly known as:  ZOCOR  Take 20 mg by mouth every evening.     Vitamin D 2000 UNITS tablet  Take 2,000 Units by mouth daily.     VITAMIN E PO  Take 1 tablet by mouth daily.       Allergies  Allergen Reactions  . Penicillins Other (See Comments)     "rash"  . Tramadol Itching   Follow-up Information    Follow up with Dorian Heckle, MD. Schedule an appointment as soon as possible for a visit in 1 week.   Specialty:  Internal Medicine   Why:  Hospital follow up   Contact information:   [address] [place] [id number] [phone]        The results of significant diagnostics from this hospitalization (including imaging, microbiology, ancillary and laboratory) are listed below for reference.    Significant Diagnostic Studies: Dg Lumbar Spine Complete  01/03/2015   CLINICAL DATA:  Patient fell approximately 1 month ago and injured the low back with bruising. Patient states the pain has subsided, but there is persistent numbness in both feet. Personal history of prostate cancer. Initial encounter.  EXAM: LUMBAR SPINE - COMPLETE 4+ VIEW  COMPARISON:  None.  FINDINGS: Five non rib-bearing lumbar vertebrae with anatomic alignment. No fractures. Disc space narrowing and endplate hypertrophic changes at every lumbar level, worst at L1- 2 and L4-5. Facet degenerative changes at every level. Visualized sacroiliac joints intact. No evidence of osseous metastatic disease. Aortoiliac atherosclerosis without aneurysm.  IMPRESSION: 1. No acute or subacute osseous abnormality. 2. Multilevel degenerative disc disease and spondylosis, worst at L1- 2 and L4-5. Multilevel facet degenerative changes. 3. No evidence of osseous metastatic disease.   Electronically Signed   By: Evangeline Dakin M.D.   On: 01/03/2015 13:17   Ct Head Wo Contrast  01/03/2015   CLINICAL DATA:  Dizziness and ataxia. Tingling involving the toes of both feet since this morning.  EXAM: CT HEAD WITHOUT CONTRAST  TECHNIQUE: Contiguous axial images were obtained from the base of the skull through the vertex without intravenous contrast.  COMPARISON:  09/17/2010  FINDINGS: Moderate cerebral atrophy is unchanged. Periventricular white-matter hypodensities are similar to the  prior study and nonspecific but compatible with mild chronic small vessel ischemic disease.  Prior bilateral cataract extraction is noted. Mild posterior right ethmoid air cell mucosal thickening is noted. Mastoid air cells are clear. 10 mm densely sclerotic focus at the superior aspect of the left occipital condyles unchanged, likely a bone island.  IMPRESSION: No evidence of acute intracranial abnormality.   Electronically Signed   By: Logan Bores   On: 01/03/2015 16:37   Dg Abd Portable 1v  01/04/2015   CLINICAL DATA:  Three day history of constipation. Patient was admitted yesterday for orthostatics hypotension which has resulted in multiple recent falls.  EXAM: PORTABLE ABDOMEN - 1 VIEW  COMPARISON:  None.  FINDINGS: Bowel gas pattern  nonobstructive. Gas within upper normal caliber loops of small bowel in the left upper quadrant. Gas and expected amount of stool throughout the normal caliber colon from cecum to rectum. No suggestion of free air on the supine film. Surgical clips in the pelvis from prior prostatectomy and node dissection. Surgical clips in right upper quadrant from prior cholecystectomy. Calcification adjacent to the right transverse process of L3 has a lucent center and therefore represents a phlebolith. No visible opaque urinary tract calculi.  IMPRESSION: No acute abdominal abnormality.   Electronically Signed   By: Evangeline Dakin M.D.   On: 01/04/2015 13:32    Microbiology: No results found for this or any previous visit (from the past 240 hour(s)).   Labs: Basic Metabolic Panel:  Recent Labs Lab 01/03/15 1207 01/03/15 1643 01/04/15 0434  NA 141  --  142  K 3.6  --  3.7  CL 107  --  110  CO2 27  --  26  GLUCOSE 104*  --  96  BUN 17  --  13  CREATININE 1.12 1.11 1.04  CALCIUM 8.6*  --  8.1*   Liver Function Tests:  Recent Labs Lab 01/03/15 1207 01/04/15 0434  AST 15 12*  ALT 11* 10*  ALKPHOS 68 61  BILITOT 0.7 0.7  PROT 6.1* 5.3*  ALBUMIN 3.4* 2.9*    No results for input(s): LIPASE, AMYLASE in the last 168 hours. No results for input(s): AMMONIA in the last 168 hours. CBC:  Recent Labs Lab 01/03/15 1207 01/03/15 1643 01/04/15 0434  WBC 5.7 4.9 4.9  NEUTROABS 4.4  --   --   HGB 10.0* 9.7* 9.0*  HCT 30.7* 30.0* 28.3*  MCV 93.6 93.2 94.3  PLT 142* 144* 131*   Cardiac Enzymes:  Recent Labs Lab 01/03/15 1643  TROPONINI <0.03   BNP: BNP (last 3 results) No results for input(s): BNP in the last 8760 hours.  ProBNP (last 3 results) No results for input(s): PROBNP in the last 8760 hours.  CBG: No results for input(s): GLUCAP in the last 168 hours.   Signed:  Doc Mandala, Orpah Melter  [facility] 01/04/2015, 2:43 PM

## 2015-01-04 NOTE — Discharge Instructions (Signed)
Please stay well hydrated 

## 2015-01-05 DIAGNOSIS — M25661 Stiffness of right knee, not elsewhere classified: Secondary | ICD-10-CM | POA: Diagnosis not present

## 2015-01-05 DIAGNOSIS — M25561 Pain in right knee: Secondary | ICD-10-CM | POA: Diagnosis not present

## 2015-01-05 DIAGNOSIS — Z96651 Presence of right artificial knee joint: Secondary | ICD-10-CM | POA: Diagnosis not present

## 2015-01-05 DIAGNOSIS — R262 Difficulty in walking, not elsewhere classified: Secondary | ICD-10-CM | POA: Diagnosis not present

## 2015-01-06 ENCOUNTER — Ambulatory Visit (INDEPENDENT_AMBULATORY_CARE_PROVIDER_SITE_OTHER): Payer: Medicare Other | Admitting: Internal Medicine

## 2015-01-06 ENCOUNTER — Encounter: Payer: Self-pay | Admitting: Internal Medicine

## 2015-01-06 VITALS — BP 120/76 | HR 80 | Temp 97.7°F | Resp 16 | Ht 70.25 in | Wt 167.4 lb

## 2015-01-06 DIAGNOSIS — R7309 Other abnormal glucose: Secondary | ICD-10-CM | POA: Diagnosis not present

## 2015-01-06 DIAGNOSIS — R972 Elevated prostate specific antigen [PSA]: Secondary | ICD-10-CM | POA: Diagnosis not present

## 2015-01-06 DIAGNOSIS — E559 Vitamin D deficiency, unspecified: Secondary | ICD-10-CM | POA: Insufficient documentation

## 2015-01-06 DIAGNOSIS — E782 Mixed hyperlipidemia: Secondary | ICD-10-CM

## 2015-01-06 DIAGNOSIS — E785 Hyperlipidemia, unspecified: Secondary | ICD-10-CM

## 2015-01-06 DIAGNOSIS — R7303 Prediabetes: Secondary | ICD-10-CM

## 2015-01-06 DIAGNOSIS — I10 Essential (primary) hypertension: Secondary | ICD-10-CM | POA: Diagnosis not present

## 2015-01-06 DIAGNOSIS — Z79899 Other long term (current) drug therapy: Secondary | ICD-10-CM | POA: Insufficient documentation

## 2015-01-06 DIAGNOSIS — I2510 Atherosclerotic heart disease of native coronary artery without angina pectoris: Secondary | ICD-10-CM

## 2015-01-06 DIAGNOSIS — Z9181 History of falling: Secondary | ICD-10-CM

## 2015-01-06 DIAGNOSIS — Z1212 Encounter for screening for malignant neoplasm of rectum: Secondary | ICD-10-CM

## 2015-01-06 DIAGNOSIS — Z125 Encounter for screening for malignant neoplasm of prostate: Secondary | ICD-10-CM | POA: Diagnosis not present

## 2015-01-06 NOTE — Patient Instructions (Signed)

## 2015-01-07 LAB — CBC WITH DIFFERENTIAL/PLATELET
BASOS ABS: 0 10*3/uL (ref 0.0–0.1)
Basophils Relative: 0 % (ref 0–1)
Eosinophils Absolute: 0.1 10*3/uL (ref 0.0–0.7)
Eosinophils Relative: 2 % (ref 0–5)
HEMATOCRIT: 36.4 % — AB (ref 39.0–52.0)
Hemoglobin: 11.8 g/dL — ABNORMAL LOW (ref 13.0–17.0)
Lymphocytes Relative: 14 % (ref 12–46)
Lymphs Abs: 1 10*3/uL (ref 0.7–4.0)
MCH: 30.9 pg (ref 26.0–34.0)
MCHC: 32.4 g/dL (ref 30.0–36.0)
MCV: 95.3 fL (ref 78.0–100.0)
MONO ABS: 0.7 10*3/uL (ref 0.1–1.0)
MPV: 10.2 fL (ref 8.6–12.4)
Monocytes Relative: 9 % (ref 3–12)
Neutro Abs: 5.6 10*3/uL (ref 1.7–7.7)
Neutrophils Relative %: 75 % (ref 43–77)
PLATELETS: 207 10*3/uL (ref 150–400)
RBC: 3.82 MIL/uL — ABNORMAL LOW (ref 4.22–5.81)
RDW: 13.8 % (ref 11.5–15.5)
WBC: 7.4 10*3/uL (ref 4.0–10.5)

## 2015-01-07 LAB — BASIC METABOLIC PANEL WITH GFR
BUN: 14 mg/dL (ref 7–25)
CALCIUM: 9.8 mg/dL (ref 8.6–10.3)
CHLORIDE: 108 meq/L (ref 98–110)
CO2: 27 meq/L (ref 20–31)
Creat: 1.17 mg/dL — ABNORMAL HIGH (ref 0.70–1.11)
GFR, EST AFRICAN AMERICAN: 66 mL/min (ref >=60)
GFR, EST NON AFRICAN AMERICAN: 57 mL/min — AB (ref >=60)
Glucose, Bld: 113 mg/dL — ABNORMAL HIGH (ref 65–99)
POTASSIUM: 4.6 meq/L (ref 3.5–5.3)
Sodium: 145 mEq/L (ref 135–146)

## 2015-01-07 LAB — URINALYSIS, MICROSCOPIC ONLY
Bacteria, UA: NONE SEEN [HPF]
CASTS: NONE SEEN [LPF]
CRYSTALS: NONE SEEN [HPF]
RBC / HPF: NONE SEEN RBC/HPF (ref <=2)
SQUAMOUS EPITHELIAL / LPF: NONE SEEN [HPF] (ref <=5)
WBC UA: NONE SEEN WBC/HPF (ref <=5)
YEAST: NONE SEEN [HPF]

## 2015-01-07 LAB — HEPATIC FUNCTION PANEL
ALBUMIN: 4.2 g/dL (ref 3.6–5.1)
ALT: 9 U/L (ref 9–46)
AST: 16 U/L (ref 10–35)
Alkaline Phosphatase: 78 U/L (ref 40–115)
BILIRUBIN DIRECT: 0.1 mg/dL (ref <=0.2)
Indirect Bilirubin: 0.4 mg/dL (ref 0.2–1.2)
TOTAL PROTEIN: 6.7 g/dL (ref 6.1–8.1)
Total Bilirubin: 0.5 mg/dL (ref 0.2–1.2)

## 2015-01-07 LAB — HEMOGLOBIN A1C
Hgb A1c MFr Bld: 5.3 % (ref <5.7)
Mean Plasma Glucose: 105 mg/dL (ref <117)

## 2015-01-07 LAB — INSULIN, RANDOM: INSULIN: 7.8 u[IU]/mL (ref 2.0–19.6)

## 2015-01-07 LAB — LIPID PANEL
Cholesterol: 145 mg/dL (ref 125–200)
HDL: 36 mg/dL — ABNORMAL LOW (ref >=40)
LDL Cholesterol: 82 mg/dL (ref <130)
Total CHOL/HDL Ratio: 4 Ratio (ref <=5.0)
Triglycerides: 133 mg/dL (ref <150)
VLDL: 27 mg/dL (ref <30)

## 2015-01-07 LAB — TSH: TSH: 2.56 u[IU]/mL (ref 0.350–4.500)

## 2015-01-07 LAB — MICROALBUMIN / CREATININE URINE RATIO
Creatinine, Urine: 190 mg/dL
MICROALB/CREAT RATIO: 4.2 mg/g (ref 0.0–30.0)
Microalb, Ur: 0.8 mg/dL (ref <2.0)

## 2015-01-07 LAB — VITAMIN D 25 HYDROXY (VIT D DEFICIENCY, FRACTURES): VIT D 25 HYDROXY: 42 ng/mL (ref 30–100)

## 2015-01-07 LAB — MAGNESIUM: Magnesium: 2.1 mg/dL (ref 1.5–2.5)

## 2015-01-08 ENCOUNTER — Other Ambulatory Visit: Payer: Self-pay | Admitting: Internal Medicine

## 2015-01-08 ENCOUNTER — Encounter: Payer: Self-pay | Admitting: Internal Medicine

## 2015-01-08 DIAGNOSIS — M25661 Stiffness of right knee, not elsewhere classified: Secondary | ICD-10-CM | POA: Diagnosis not present

## 2015-01-08 DIAGNOSIS — R262 Difficulty in walking, not elsewhere classified: Secondary | ICD-10-CM | POA: Diagnosis not present

## 2015-01-08 DIAGNOSIS — M25561 Pain in right knee: Secondary | ICD-10-CM | POA: Diagnosis not present

## 2015-01-08 DIAGNOSIS — Z96651 Presence of right artificial knee joint: Secondary | ICD-10-CM | POA: Diagnosis not present

## 2015-01-08 NOTE — Progress Notes (Signed)
Patient ID: Ryan Humphrey, male   DOB: [birth year], 81 y.o.   MRN: [id number]    Comprehensive Examination  This very nice 81 y.o. Ryan Humphrey presents as a new patient for comprehensive exam.  Patient has been followed for labile  HTN, hx/o elevated blood sugars, Hyperlipidemia, and Vitamin D Deficiency.In 1998 he underwent TURP and radiation treatment for cancer of the prostate and PSA's have remained less than 1 over the last 4-5 years.    Patient has hx/o labile HTN for a number of years circa 1990's and has been followed expectantly.  Patient's BP's most recently have been controlled.Today's BP: 120/76 mmHg. In Jan this year he had a negative Myocardial stress test by Dr Dorris Carnes. Patient denies any cardiac symptoms as chest pain, palpitations, shortness of breath, dizziness or ankle swelling.   Patient has hx/o  hyperlipidemia & allegedly has been controlled on his current meds with diet , but records of labs not yet available. Patient denies myalgias or other medication SE's.   Patient has hx/o elevated glucoses in the past and full records for review have not yet been received for review.     Patient denies reactive hypoglycemic symptoms, visual blurring, diabetic polys , but he has been evaluated by Neurology & dx'd with a peripheral sensory neuropathy with tingling and burning  Paresthesias of his feet not improved on his current dose of Gabapentin.   Finally, patient has history of Vitamin D Deficiency and is on supplements . Patient is on Fosamax x 3 years for Osteoporosis and has no hx/o fractures. He did undergo Rt TKR in June by Dr Latanya Maudlin.   Medication Sig  . alendronate (FOSAMAX) 70 MG tablet Take 70 mg by mouth every Saturday. Take with a full glass of water on an empty stomach.  Marland Kitchen aspirin EC 325 MG EC tablet Take 1 tablet (325 mg total) by mouth 2 (two) times daily after a meal.  . Calcium Carbonate-Vitamin D (CALTRATE 600+D PO) Take 1 tablet by mouth daily.   . Cholecalciferol  (VITAMIN D) 2000 UNITS tablet Take 2,000 Units by mouth daily.  . cyanocobalamin (,VITAMIN B-12,) 1000 MCG/ML injection Inject 1,000 mcg into the muscle every 30 (thirty) days.   . diphenoxylate-atropine (LOMOTIL) 2.5-0.025 MG per tablet Take 1 tablet by mouth 4 (four) times daily as needed for diarrhea or loose stools.  . gabapentin (NEURONTIN) 300 MG capsule Take 300 mg by mouth 2 (two) times daily.   Marland Kitchen HYDROcodone-acetaminophen (NORCO/VICODIN) 5-325 MG per tablet Take 1-2 tablets by mouth every 4 (four) hours as needed (breakthrough pain).  . propranolol (INDERAL) 20 MG tablet Take 1 tablet (20 mg total) by mouth 2 (two) times daily.  . sertraline (ZOLOFT) 100 MG tablet Take 100 mg by mouth daily.  . simvastatin (ZOCOR) 20 MG tablet Take 20 mg by mouth every evening.  Marland Kitchen VITAMIN E PO Take 1 tablet by mouth daily.  . methocarbamol (ROBAXIN) 500 MG tablet Take 1 tablet (500 mg total) by mouth every 6 (six) hours as needed for muscle spasms.   Allergies  Allergen Reactions  . Penicillins Other (See Comments)    "rash"  . Tramadol Itching   Past Medical History  Diagnosis Date  . Neuropathy   . Hyperlipidemia   . Arthritis     arthritis,osteopenia,"spinal stenosis"  . GERD (gastroesophageal reflux disease)   . H/O hiatal hernia   . H/O vertigo 12-06-12    none recent  . Anemia   . Depression   .  Cancer 12-06-12    Prostate cancer'98  . Peripheral neuropathy 12-06-12    peripheral neuropathy  . RLS (restless legs syndrome)   . Rhinitis   . CAD (coronary artery disease)   . Osteopenia   . Vitamin B12 deficiency   . IBS (irritable bowel syndrome)    Health Maintenance  Topic Date Due  . TETANUS/TDAP  03/26/1950  . ZOSTAVAX  03/27/1991  . PNA vac Low Risk Adult (1 of 2 - PCV13) 03/26/1996  . INFLUENZA VACCINE  01/12/2015  . COLONOSCOPY  12/26/2022   Past Surgical History  Procedure Laterality Date  . Appendectomy    . Cholecystectomy    . Carpal tunnel release      both  hands  . Prostate surgery  12-06-12  . Cataract surgery Left 12-06-12    recent surgery  . Esophagogastroduodenoscopy (egd) with propofol N/A 12/25/2012    Procedure: ESOPHAGOGASTRODUODENOSCOPY (EGD) WITH PROPOFOL;  Surgeon: Garlan Fair, MD;  Location: [facility];  Service: Endoscopy;  Laterality: N/A;  . Colonoscopy with propofol N/A 12/25/2012    Procedure: COLONOSCOPY WITH PROPOFOL;  Surgeon: Garlan Fair, MD;  Location: [facility];  Service: Endoscopy;  Laterality: N/A;  . Tonsillectomy    . Total knee arthroplasty Right 11/25/2014    Procedure: RIGHT TOTAL KNEE ARTHROPLASTY;  Surgeon: Melrose Nakayama, MD;  Location: [facility];  Service: Orthopedics;  Laterality: Right;   Family History  Problem Relation Age of Onset  . CAD Father     Deceased, 32  . Dementia Mother     Deceased, 70  . Colon cancer Neg Hx   . Colon polyps Neg Hx   . Kidney disease Neg Hx   . Esophageal cancer Neg Hx   . Gallbladder disease Neg Hx   . Diabetes Brother   . Hyperlipidemia Brother   . Hypertension Brother   . Dementia Brother    History   Social History  . Marital Status: Widowed    Spouse Name: N/A  . Number of Children: 2  . Years of Education: College   Occupational History  . Retired    Social History Main Topics  . Smoking status: Former Smoker -- 0.50 packs/day for 30 years    Quit date: 06/14/1963  . Smokeless tobacco: Never Used  . Alcohol Use: No  . Drug Use: No  . Sexual Activity: Not Currently   Social History Narrative   Pt lives at home alone, widowed in 2011, married for 55 years. They have two grown daugthers.   Plays the piano.   Caffeine Use: Rarely    ROS Constitutional: Denies fever, chills, weight loss/gain, headaches, insomnia,  night sweats or change in appetite. Does c/o fatigue. Eyes: Denies redness, blurred vision, diplopia, discharge, itchy or watery eyes.  ENT: Denies discharge, congestion, post nasal drip, epistaxis, sore throat, earache, hearing  loss, dental pain, Tinnitus, Vertigo, Sinus pain or snoring.  Cardio: Denies chest pain, palpitations, irregular heartbeat, syncope, dyspnea, diaphoresis, orthopnea, PND, claudication or edema Respiratory: denies cough, dyspnea, DOE, pleurisy, hoarseness, laryngitis or wheezing.  Gastrointestinal: Denies dysphagia, heartburn, reflux, water brash, pain, cramps, nausea, vomiting, bloating, diarrhea, constipation, hematemesis, melena, hematochezia, jaundice or hemorrhoids Genitourinary: Denies dysuria, frequency, urgency, nocturia, hesitancy, discharge, hematuria or flank pain Musculoskeletal: Denies arthralgia, myalgia, stiffness, Jt. Swelling, pain, limp or strain/sprain. Denies Falls. Skin: Denies puritis, rash, hives, warts, acne, eczema or change in skin lesion Neuro: No weakness, tremor, incoordination, spasms, paresthesia or pain Psychiatric: Denies confusion, memory loss or sensory  loss. Denies Depression. Endocrine: Denies change in weight, skin, hair change, nocturia, and paresthesia, diabetic polys, visual blurring or hyper / hypo glycemic episodes.  Heme/Lymph: No excessive bleeding, bruising or enlarged lymph nodes.  Physical Exam  BP 120/76 mmHg  Pulse 80  Temp(Src) 97.7 F (36.5 C)  Resp 16  Ht 5' 10.25" (1.784 m)  Wt 167 lb 6.4 oz (75.932 kg)  BMI 23.86 kg/m2  General Appearance: Well nourished, in no apparent distress.  Eyes: PERRLA, EOMs, conjunctiva no swelling or erythema, normal fundi and vessels. Sinuses: No frontal/maxillary tenderness ENT/Mouth: EACs patent / TMs  nl. Nares clear without erythema, swelling, mucoid exudates. Oral hygiene is good. No erythema, swelling, or exudate. Tongue normal, non-obstructing. Tonsils not swollen or erythematous. Hearing normal.  Neck: Supple, thyroid normal. No bruits, nodes or JVD. Respiratory: Respiratory effort normal.  BS equal and clear bilateral without rales, rhonci, wheezing or stridor. Cardio: Heart sounds are normal with  regular rate and rhythm and no murmurs, rubs or gallops. Peripheral pulses are normal and equal bilaterally without edema. No aortic or femoral bruits. Chest: symmetric with normal excursions and percussion.  Abdomen: Flat, soft, with bowel sounds. Nontender, no guarding, rebound, hernias, masses, or organomegaly.  Lymphatics: Non tender without lymphadenopathy.  Genitourinary: Deferred to Dr Lawerance Bach. Musculoskeletal: Full ROM all peripheral extremities, joint stability, 5/5 strength, and normal gait. Skin: Warm and dry without rashes, lesions, cyanosis, clubbing or  ecchymosis.  Neuro: Cranial nerves intact, DTR's flat throughout. Decreased  Muscle power, tone & bulk consistent with age and deconditioning. No cerebellar symptoms. Sensation decreased in a stocking distribution.  Pysch: Awake and oriented X 3 with normal affect, insight and judgment appropriate.   Assessment and Plan  1. Atherosclerosis of native coronary artery of native heart without angina pectoris   2. Mixed hyperlipidemia   3. HYPERTENSION, BENIGN  - Microalbumin / creatinine urine ratio - TSH  4. HLD (hyperlipidemia)  - Lipid panel  5. Vitamin D deficiency  - Vit D  25 hydroxy   6. Prediabetes  - Hemoglobin A1c - Insulin, random  7. Medication management  - Urine Microscopic - CBC with Differential/Platelet - BASIC METABOLIC PANEL WITH GFR - Hepatic function panel - Magnesium  8. Screening for rectal cancer   9. At low risk for fall   10. Elevated PSA - hx/o prostate Ca in 1998 s/p TURP and radiotherapy    Continue prudent diet as discussed, weight control, BP monitoring, regular exercise, and medications as discussed.  Discussed med effects and SE's. Routine screening labs and tests as requested with regular follow-up as recommended.  Over 40 minutes of exam, counseling &  chart review was performed

## 2015-01-12 DIAGNOSIS — M25561 Pain in right knee: Secondary | ICD-10-CM | POA: Diagnosis not present

## 2015-01-12 DIAGNOSIS — M25661 Stiffness of right knee, not elsewhere classified: Secondary | ICD-10-CM | POA: Diagnosis not present

## 2015-01-12 DIAGNOSIS — R262 Difficulty in walking, not elsewhere classified: Secondary | ICD-10-CM | POA: Diagnosis not present

## 2015-01-12 DIAGNOSIS — Z96651 Presence of right artificial knee joint: Secondary | ICD-10-CM | POA: Diagnosis not present

## 2015-01-15 DIAGNOSIS — R262 Difficulty in walking, not elsewhere classified: Secondary | ICD-10-CM | POA: Diagnosis not present

## 2015-01-15 DIAGNOSIS — M25561 Pain in right knee: Secondary | ICD-10-CM | POA: Diagnosis not present

## 2015-01-15 DIAGNOSIS — Z96651 Presence of right artificial knee joint: Secondary | ICD-10-CM | POA: Diagnosis not present

## 2015-01-15 DIAGNOSIS — M25661 Stiffness of right knee, not elsewhere classified: Secondary | ICD-10-CM | POA: Diagnosis not present

## 2015-01-19 ENCOUNTER — Telehealth: Payer: Self-pay | Admitting: *Deleted

## 2015-01-19 DIAGNOSIS — R262 Difficulty in walking, not elsewhere classified: Secondary | ICD-10-CM | POA: Diagnosis not present

## 2015-01-19 DIAGNOSIS — M25561 Pain in right knee: Secondary | ICD-10-CM | POA: Diagnosis not present

## 2015-01-19 DIAGNOSIS — Z96651 Presence of right artificial knee joint: Secondary | ICD-10-CM | POA: Diagnosis not present

## 2015-01-19 DIAGNOSIS — M25661 Stiffness of right knee, not elsewhere classified: Secondary | ICD-10-CM | POA: Diagnosis not present

## 2015-01-19 NOTE — Telephone Encounter (Signed)
Called patient and informed him that his last labs were available to review on his MyChart.  Left message he can call me if he has questions.

## 2015-01-21 DIAGNOSIS — Z471 Aftercare following joint replacement surgery: Secondary | ICD-10-CM | POA: Diagnosis not present

## 2015-01-21 DIAGNOSIS — R262 Difficulty in walking, not elsewhere classified: Secondary | ICD-10-CM | POA: Diagnosis not present

## 2015-01-21 DIAGNOSIS — M25661 Stiffness of right knee, not elsewhere classified: Secondary | ICD-10-CM | POA: Diagnosis not present

## 2015-01-21 DIAGNOSIS — Z96651 Presence of right artificial knee joint: Secondary | ICD-10-CM | POA: Diagnosis not present

## 2015-01-21 DIAGNOSIS — M25561 Pain in right knee: Secondary | ICD-10-CM | POA: Diagnosis not present

## 2015-01-27 DIAGNOSIS — Z96651 Presence of right artificial knee joint: Secondary | ICD-10-CM | POA: Diagnosis not present

## 2015-01-27 DIAGNOSIS — M25661 Stiffness of right knee, not elsewhere classified: Secondary | ICD-10-CM | POA: Diagnosis not present

## 2015-01-27 DIAGNOSIS — M25561 Pain in right knee: Secondary | ICD-10-CM | POA: Diagnosis not present

## 2015-01-27 DIAGNOSIS — R262 Difficulty in walking, not elsewhere classified: Secondary | ICD-10-CM | POA: Diagnosis not present

## 2015-01-29 DIAGNOSIS — Z96651 Presence of right artificial knee joint: Secondary | ICD-10-CM | POA: Diagnosis not present

## 2015-01-29 DIAGNOSIS — M25661 Stiffness of right knee, not elsewhere classified: Secondary | ICD-10-CM | POA: Diagnosis not present

## 2015-01-29 DIAGNOSIS — M25561 Pain in right knee: Secondary | ICD-10-CM | POA: Diagnosis not present

## 2015-01-29 DIAGNOSIS — R262 Difficulty in walking, not elsewhere classified: Secondary | ICD-10-CM | POA: Diagnosis not present

## 2015-02-04 DIAGNOSIS — M25661 Stiffness of right knee, not elsewhere classified: Secondary | ICD-10-CM | POA: Diagnosis not present

## 2015-02-04 DIAGNOSIS — Z96651 Presence of right artificial knee joint: Secondary | ICD-10-CM | POA: Diagnosis not present

## 2015-02-04 DIAGNOSIS — M25561 Pain in right knee: Secondary | ICD-10-CM | POA: Diagnosis not present

## 2015-02-04 DIAGNOSIS — R262 Difficulty in walking, not elsewhere classified: Secondary | ICD-10-CM | POA: Diagnosis not present

## 2015-02-05 DIAGNOSIS — D692 Other nonthrombocytopenic purpura: Secondary | ICD-10-CM | POA: Diagnosis not present

## 2015-02-05 DIAGNOSIS — D1801 Hemangioma of skin and subcutaneous tissue: Secondary | ICD-10-CM | POA: Diagnosis not present

## 2015-02-05 DIAGNOSIS — Z85828 Personal history of other malignant neoplasm of skin: Secondary | ICD-10-CM | POA: Diagnosis not present

## 2015-02-05 DIAGNOSIS — L570 Actinic keratosis: Secondary | ICD-10-CM | POA: Diagnosis not present

## 2015-02-05 DIAGNOSIS — D225 Melanocytic nevi of trunk: Secondary | ICD-10-CM | POA: Diagnosis not present

## 2015-02-18 DIAGNOSIS — Z471 Aftercare following joint replacement surgery: Secondary | ICD-10-CM | POA: Diagnosis not present

## 2015-02-18 DIAGNOSIS — Z96651 Presence of right artificial knee joint: Secondary | ICD-10-CM | POA: Diagnosis not present

## 2015-03-05 ENCOUNTER — Encounter: Payer: Self-pay | Admitting: Internal Medicine

## 2015-03-05 ENCOUNTER — Ambulatory Visit (INDEPENDENT_AMBULATORY_CARE_PROVIDER_SITE_OTHER): Payer: Medicare Other | Admitting: Internal Medicine

## 2015-03-05 VITALS — BP 110/62 | HR 88 | Temp 97.5°F | Resp 16 | Ht 70.5 in | Wt 166.4 lb

## 2015-03-05 DIAGNOSIS — D519 Vitamin B12 deficiency anemia, unspecified: Secondary | ICD-10-CM

## 2015-03-05 DIAGNOSIS — I10 Essential (primary) hypertension: Secondary | ICD-10-CM | POA: Diagnosis not present

## 2015-03-05 DIAGNOSIS — E782 Mixed hyperlipidemia: Secondary | ICD-10-CM

## 2015-03-05 DIAGNOSIS — Z79899 Other long term (current) drug therapy: Secondary | ICD-10-CM

## 2015-03-05 DIAGNOSIS — F4329 Adjustment disorder with other symptoms: Secondary | ICD-10-CM | POA: Diagnosis not present

## 2015-03-05 DIAGNOSIS — R7309 Other abnormal glucose: Secondary | ICD-10-CM

## 2015-03-05 DIAGNOSIS — Z23 Encounter for immunization: Secondary | ICD-10-CM | POA: Diagnosis not present

## 2015-03-05 DIAGNOSIS — E559 Vitamin D deficiency, unspecified: Secondary | ICD-10-CM | POA: Diagnosis not present

## 2015-03-05 DIAGNOSIS — R7303 Prediabetes: Secondary | ICD-10-CM

## 2015-03-05 DIAGNOSIS — I2510 Atherosclerotic heart disease of native coronary artery without angina pectoris: Secondary | ICD-10-CM

## 2015-03-05 LAB — CBC WITH DIFFERENTIAL/PLATELET
BASOS ABS: 0 10*3/uL (ref 0.0–0.1)
Basophils Relative: 0 % (ref 0–1)
EOS PCT: 1 % (ref 0–5)
Eosinophils Absolute: 0.1 10*3/uL (ref 0.0–0.7)
HEMATOCRIT: 38 % — AB (ref 39.0–52.0)
Hemoglobin: 12.6 g/dL — ABNORMAL LOW (ref 13.0–17.0)
LYMPHS ABS: 1 10*3/uL (ref 0.7–4.0)
LYMPHS PCT: 14 % (ref 12–46)
MCH: 30.7 pg (ref 26.0–34.0)
MCHC: 33.2 g/dL (ref 30.0–36.0)
MCV: 92.5 fL (ref 78.0–100.0)
MPV: 11.1 fL (ref 8.6–12.4)
Monocytes Absolute: 0.7 10*3/uL (ref 0.1–1.0)
Monocytes Relative: 9 % (ref 3–12)
Neutro Abs: 5.6 10*3/uL (ref 1.7–7.7)
Neutrophils Relative %: 76 % (ref 43–77)
Platelets: 156 10*3/uL (ref 150–400)
RBC: 4.11 MIL/uL — ABNORMAL LOW (ref 4.22–5.81)
RDW: 14.3 % (ref 11.5–15.5)
WBC: 7.4 10*3/uL (ref 4.0–10.5)

## 2015-03-05 MED ORDER — SERTRALINE HCL 100 MG PO TABS: 100.0000 mg | ORAL_TABLET | Freq: Every day | ORAL | Status: DC

## 2015-03-05 NOTE — Patient Instructions (Signed)

## 2015-03-05 NOTE — Progress Notes (Signed)
Patient ID: Ryan Humphrey, male   DOB: 17-Nov-1928, 81 y.o.   MRN: [id number]   This very nice 81 y.o. Ryan Humphrey presents for 1st follow up with HTN, Hyperlipidemia, Pre-Diabetes and Vitamin D Deficiency. Patient is still recovering from a Rt TKR in June 2016 by Dr Ryan Humphrey.    Patient is treated for HTN & BP has been controlled and today's BP: 110/62 mmHg. Patient has had no complaints of any cardiac type chest pain, palpitations, dyspnea/orthopnea/PND, dizziness, claudication, or dependent edema.   Hyperlipidemia is controlled with diet & meds. Patient denies myalgias or other med SE's. Last Lipids were at goal - Cholesterol 145; HDL 36*; LDL 82; Triglycerides 133 on 01/06/2015.    Also, the patient is monitored expectantly for Prediabetes and has had no symptoms of reactive hypoglycemia, diabetic polys, paresthesias or visual blurring.  Last A1c was 5.3% on 01/06/2015.   Further, the patient also has history of Vitamin D Deficiency and supplements vitamin D without any suspected side-effects. Last vitamin D was low at 42 on 01/06/2015.  Medication Sig  . alendronate (FOSAMAX) 70 MG tablet Take 70 mg by mouth every Saturday. Take with a full glass of water on an empty stomach.  Marland Kitchen aspirin EC 325 MG EC tablet Take 1 tablet (325 mg total) by mouth 2 (two) times daily after a meal.  . CALTRATE 600+D  Take 1 tablet by mouth daily.   Marland Kitchen VITAMIN D 2000 UNITS  Take 2,000 Units by mouth daily.  Marland Kitchen VITAMIN B-12 1000 MCG/ML inj  Inject 1,000 mcg into the muscle every 30 (thirty) days. - OFF x 3-4 months  . diphenoxylate-atropine (LOMOTIL) 2.5-0.025 MG  Take 1 tablet by mouth 4 (four) times daily as needed for diarrhea or loose stools.  . gabapentin (NEURONTIN) 300 MG capsule Take 300 mg by mouth 2 (two) times daily.   . propranolol (INDERAL) 20 MG tablet Take 1 tablet (20 mg total) by mouth 2 (two) times daily.  . sertraline (ZOLOFT) 100 MG tablet Take 100 mg by mouth daily.  . simvastatin (20 MG tablet Take 20 mg  by mouth every evening.  . NORCO 5-325 MG  Take 1-2 tablets by mouth every 4 (four) hours as needed (breakthrough pain).     Allergies  Allergen Reactions  . Penicillins Other (See Comments)    "rash"  . Tramadol Itching    PMHx:   Past Medical History  Diagnosis Date  . Neuropathy   . Hyperlipidemia   . Arthritis     arthritis,osteopenia,"spinal stenosis"  . GERD (gastroesophageal reflux disease)   . H/O hiatal hernia   . H/O vertigo 12-06-12    none recent  . Anemia   . Depression   . Cancer 12-06-12    Prostate cancer'98  . Peripheral neuropathy 12-06-12    peripheral neuropathy  . RLS (restless legs syndrome)   . Rhinitis   . CAD (coronary artery disease)   . Osteopenia   . Vitamin B12 deficiency   . IBS (irritable bowel syndrome)     There is no immunization history on file for this patient. Past Surgical History  Procedure Laterality Date  . Appendectomy    . Cholecystectomy    . Carpal tunnel release      both hands  . Prostate surgery  12-06-12  . Cataract surgery Left 12-06-12    recent surgery  . Esophagogastroduodenoscopy (egd) with propofol N/A 12/25/2012    Procedure: ESOPHAGOGASTRODUODENOSCOPY (EGD) WITH PROPOFOL;  Surgeon: Garlan Fair,  MD;  Location: [facility];  Service: Endoscopy;  Laterality: N/A;  . Colonoscopy with propofol N/A 12/25/2012    Procedure: COLONOSCOPY WITH PROPOFOL;  Surgeon: Garlan Fair, MD;  Location: [facility];  Service: Endoscopy;  Laterality: N/A;  . Tonsillectomy    . Total knee arthroplasty Right 11/25/2014    Procedure: RIGHT TOTAL KNEE ARTHROPLASTY;  Surgeon: Melrose Nakayama, MD;  Location: [place];  Service: Orthopedics;  Laterality: Right;   FHx:    Reviewed / unchanged  SHx:    Reviewed / unchanged  Systems Review:  Constitutional: Denies fever, chills, wt changes, headaches, insomnia, fatigue, night sweats, change in appetite. Eyes: Denies redness, blurred vision, diplopia, discharge, itchy, watery eyes.   ENT: Denies discharge, congestion, post nasal drip, epistaxis, sore throat, earache, hearing loss, dental pain, tinnitus, vertigo, sinus pain, snoring.  CV: Denies chest pain, palpitations, irregular heartbeat, syncope, dyspnea, diaphoresis, orthopnea, PND, claudication or edema. Respiratory: denies cough, dyspnea, DOE, pleurisy, hoarseness, laryngitis, wheezing.  Gastrointestinal: Denies dysphagia, odynophagia, heartburn, reflux, water brash, abdominal pain or cramps, nausea, vomiting, bloating, diarrhea, constipation, hematemesis, melena, hematochezia  or hemorrhoids. Genitourinary: Denies dysuria, frequency, urgency, nocturia, hesitancy, discharge, hematuria or flank pain. Musculoskeletal: Denies arthralgias, myalgias, stiffness, jt. swelling, pain, limping or strain/sprain.  Skin: Denies pruritus, rash, hives, warts, acne, eczema or change in skin lesion(s). Neuro: No weakness, tremor, incoordination, spasms, paresthesia or pain. Psychiatric: Denies confusion, memory loss or sensory loss. Endo: Denies change in weight, skin or hair change.  Heme/Lymph: No excessive bleeding, bruising or enlarged lymph nodes.  Physical Exam  BP 110/62 mmHg  Pulse 88  Temp(Src) 97.5 F (36.4 C)  Resp 16  Ht 5' 10.5" (1.791 m)  Wt 166 lb 6.4 oz (75.479 kg)  BMI 23.53 kg/m2  Appears well nourished and in no distress. Eyes: PERRLA, EOMs, conjunctiva no swelling or erythema. Sinuses: No frontal/maxillary tenderness ENT/Mouth: EAC's clear, TM's nl w/o erythema, bulging. Nares clear w/o erythema, swelling, exudates. Oropharynx clear without erythema or exudates. Oral hygiene is good. Tongue normal, non obstructing. Hearing intact.  Neck: Supple. Thyroid nl. Car 2+/2+ without bruits, nodes or JVD. Chest: Respirations nl with BS clear & equal w/o rales, rhonchi, wheezing or stridor.  Cor: Heart sounds normal w/ regular rate and rhythm without sig. murmurs, gallops, clicks, or rubs. Peripheral pulses normal  and equal  without edema.  Abdomen: Soft & bowel sounds normal. Non-tender w/o guarding, rebound, hernias, masses, or organomegaly.  Lymphatics: Unremarkable.  Musculoskeletal: Full ROM all peripheral extremities, joint stability, 5/5 strength, and normal gait.  Skin: Warm, dry without exposed rashes, lesions or ecchymosis apparent.  Neuro: Cranial nerves intact, reflexes equal bilaterally. Sensory-motor testing grossly intact. Tendon reflexes grossly intact.  Pysch: Alert & oriented x 3.  Insight and judgement nl & appropriate. No ideations.  Assessment and Plan:  1. HYPERTENSION, BENIGN   2. Mixed hyperlipidemia   3. Prediabetes   4. Vitamin D deficiency  - to increase dose to 8,000 u/da   5. Medication management  - BASIC METABOLIC PANEL WITH GFR - CBC with Differential/Platelet   Recommended regular exercise, BP monitoring, weight control, and discussed med and SE's. Recommended labs to assess and monitor clinical status. Further disposition pending results of labs. Over 30 minutes of exam, counseling, chart review was performed To Taper ASA to 81 mg/da.

## 2015-03-06 LAB — VITAMIN B12: Vitamin B-12: 462 pg/mL (ref 211–911)

## 2015-03-06 LAB — BASIC METABOLIC PANEL WITH GFR
BUN: 22 mg/dL (ref 7–25)
CHLORIDE: 108 mmol/L (ref 98–110)
CO2: 28 mmol/L (ref 20–31)
CREATININE: 1.11 mg/dL (ref 0.70–1.11)
Calcium: 9.6 mg/dL (ref 8.6–10.3)
GFR, Est African American: 71 mL/min (ref >=60)
GFR, Est Non African American: 61 mL/min (ref >=60)
GLUCOSE: 91 mg/dL (ref 65–99)
Potassium: 5.2 mmol/L (ref 3.5–5.3)
Sodium: 148 mmol/L — ABNORMAL HIGH (ref 135–146)

## 2015-05-18 DIAGNOSIS — N320 Bladder-neck obstruction: Secondary | ICD-10-CM | POA: Diagnosis not present

## 2015-05-18 DIAGNOSIS — N393 Stress incontinence (female) (male): Secondary | ICD-10-CM | POA: Diagnosis not present

## 2015-05-18 DIAGNOSIS — C61 Malignant neoplasm of prostate: Secondary | ICD-10-CM | POA: Diagnosis not present

## 2015-05-18 DIAGNOSIS — Z8546 Personal history of malignant neoplasm of prostate: Secondary | ICD-10-CM | POA: Diagnosis not present

## 2015-05-18 DIAGNOSIS — N529 Male erectile dysfunction, unspecified: Secondary | ICD-10-CM | POA: Diagnosis not present

## 2015-05-20 DIAGNOSIS — Z96651 Presence of right artificial knee joint: Secondary | ICD-10-CM | POA: Diagnosis not present

## 2015-06-12 ENCOUNTER — Encounter: Payer: Self-pay | Admitting: *Deleted

## 2015-06-30 ENCOUNTER — Ambulatory Visit (INDEPENDENT_AMBULATORY_CARE_PROVIDER_SITE_OTHER): Payer: Medicare Other | Admitting: Internal Medicine

## 2015-06-30 ENCOUNTER — Encounter: Payer: Self-pay | Admitting: Internal Medicine

## 2015-06-30 VITALS — BP 118/72 | HR 76 | Temp 97.7°F | Resp 16 | Ht 70.25 in | Wt 172.2 lb

## 2015-06-30 DIAGNOSIS — Z79899 Other long term (current) drug therapy: Secondary | ICD-10-CM | POA: Diagnosis not present

## 2015-06-30 DIAGNOSIS — D519 Vitamin B12 deficiency anemia, unspecified: Secondary | ICD-10-CM

## 2015-06-30 DIAGNOSIS — I10 Essential (primary) hypertension: Secondary | ICD-10-CM | POA: Diagnosis not present

## 2015-06-30 DIAGNOSIS — R7303 Prediabetes: Secondary | ICD-10-CM | POA: Diagnosis not present

## 2015-06-30 DIAGNOSIS — E782 Mixed hyperlipidemia: Secondary | ICD-10-CM | POA: Diagnosis not present

## 2015-06-30 DIAGNOSIS — E559 Vitamin D deficiency, unspecified: Secondary | ICD-10-CM | POA: Diagnosis not present

## 2015-06-30 DIAGNOSIS — R7309 Other abnormal glucose: Secondary | ICD-10-CM

## 2015-06-30 DIAGNOSIS — D518 Other vitamin B12 deficiency anemias: Secondary | ICD-10-CM | POA: Diagnosis not present

## 2015-06-30 LAB — CBC WITH DIFFERENTIAL/PLATELET
BASOS ABS: 0 10*3/uL (ref 0.0–0.1)
Basophils Relative: 0 % (ref 0–1)
EOS ABS: 0.1 10*3/uL (ref 0.0–0.7)
EOS PCT: 2 % (ref 0–5)
HEMATOCRIT: 38.6 % — AB (ref 39.0–52.0)
Hemoglobin: 12.9 g/dL — ABNORMAL LOW (ref 13.0–17.0)
LYMPHS PCT: 15 % (ref 12–46)
Lymphs Abs: 1 10*3/uL (ref 0.7–4.0)
MCH: 32 pg (ref 26.0–34.0)
MCHC: 33.4 g/dL (ref 30.0–36.0)
MCV: 95.8 fL (ref 78.0–100.0)
MONO ABS: 0.6 10*3/uL (ref 0.1–1.0)
MONOS PCT: 9 % (ref 3–12)
MPV: 11.3 fL (ref 8.6–12.4)
Neutro Abs: 5 10*3/uL (ref 1.7–7.7)
Neutrophils Relative %: 74 % (ref 43–77)
PLATELETS: 151 10*3/uL (ref 150–400)
RBC: 4.03 MIL/uL — ABNORMAL LOW (ref 4.22–5.81)
RDW: 13.3 % (ref 11.5–15.5)
WBC: 6.8 10*3/uL (ref 4.0–10.5)

## 2015-06-30 NOTE — Progress Notes (Signed)
Patient ID: Ryan Humphrey, male   DOB: Nov 02, 1928, 82 y.o.   MRN: [id number]   This very nice 82 y.o. [place] presents for follow up with Hypertension, Hyperlipidemia, Pre-Diabetes and Vitamin D Deficiency. Patient also has remote hx/o Prostate Ca since 1998 - treated with TURP & Radiation and had a 2sd TURP in 2006. Patient also is on Gabapentin for a peripheral sensory neuropathy .    Patient is treated for HTN & BP has been controlled at home. Today's BP: 118/72 mmHg. Patient has had no complaints of any cardiac type chest pain, palpitations, dyspnea/orthopnea/PND, dizziness, claudication, or dependent edema.   Hyperlipidemia is controlled with diet & meds. Patient denies myalgias or other med SE's. Last Lipids were at goal with T Chol 145, HDL 36, Trig 133 & LDL 82 In July 2016.   Also, the patient has history of PreDiabetes and has had no symptoms of reactive hypoglycemia, diabetic polys, paresthesias or visual blurring.  Last A1c was 5.3% on 01/06/2015.   Further, the patient also has history of Vitamin D Deficiency and supplements vitamin D without any suspected side-effects. Last vitamin D was 42 on 01/06/2015.     Medication Sig  . alendronate (FOSAMAX) 70 MG tablet Take 70 mg by mouth every Saturday. Take with a full glass of water on an empty stomach.  Marland Kitchen aspirin EC 325 MG EC tablet Take 1 tablet (325 mg total) by mouth 2 (two) times daily after a meal.  . CALTRATE 600+D  Take 1 tablet by mouth daily.   Marland Kitchen VITAMIN D 2000 UNITS tablet Take 8,000 Units by mouth daily.  Marland Kitchen VITAMIN B-12 1000 MCG/ML inj Inject 1,000 mcg into the muscle every 30 (thirty) days.   . LOMOTIL 2.5-0.025  Take 1 tablet by mouth 4 (four) times daily as needed for diarrhea or loose stools.  . gabapentin  300 MG capsule Take 300 mg by mouth 2 (two) times daily.   . propranolol  20 MG tablet Take 1 tablet (20 mg total) by mouth 2 (two) times daily.  . sertraline  100 MG tablet Take 1 tablet (100 mg total) by mouth daily.  .  simvastatin  20 MG tablet Take 20 mg by mouth every evening.   Allergies  Allergen Reactions  . Penicillins Other (See Comments)    "rash"  . Tramadol Itching   PMHx:   Past Medical History  Diagnosis Date  . Neuropathy   . Hyperlipidemia   . Arthritis     arthritis,osteopenia,"spinal stenosis"  . GERD (gastroesophageal reflux disease)   . H/O hiatal hernia   . H/O vertigo 12-06-12    none recent  . Anemia   . Depression   . Cancer 12-06-12    Prostate cancer'98  . Peripheral neuropathy 12-06-12    peripheral neuropathy  . RLS (restless legs syndrome)   . Rhinitis   . CAD (coronary artery disease)   . Osteopenia   . Vitamin B12 deficiency   . IBS (irritable bowel syndrome)    Immunization History  Administered Date(s) Administered  . Influenza, High Dose Seasonal PF 03/05/2015   Past Surgical History  Procedure Laterality Date  . Appendectomy    . Cholecystectomy    . Carpal tunnel release      both hands  . Prostate surgery  12-06-12  . Cataract surgery Left 12-06-12    recent surgery  . Esophagogastroduodenoscopy (egd) with propofol N/A 12/25/2012    Procedure: ESOPHAGOGASTRODUODENOSCOPY (EGD) WITH PROPOFOL;  Surgeon: Ursula Alert  Wynetta Emery, MD;  Location: [facility];  Service: Endoscopy;  Laterality: N/A;  . Colonoscopy with propofol N/A 12/25/2012    Procedure: COLONOSCOPY WITH PROPOFOL;  Surgeon: Garlan Fair, MD;  Location: [facility];  Service: Endoscopy;  Laterality: N/A;  . Tonsillectomy    . Total knee arthroplasty Right 11/25/2014    Procedure: RIGHT TOTAL KNEE ARTHROPLASTY;  Surgeon: Melrose Nakayama, MD;  Location: [place];  Service: Orthopedics;  Laterality: Right;   FHx:    Reviewed / unchanged  SHx:    Reviewed / unchanged  Systems Review:  Constitutional: Denies fever, chills, wt changes, headaches, insomnia, fatigue, night sweats, change in appetite. Eyes: Denies redness, blurred vision, diplopia, discharge, itchy, watery eyes.  ENT: Denies  discharge, congestion, post nasal drip, epistaxis, sore throat, earache, hearing loss, dental pain, tinnitus, vertigo, sinus pain, snoring.  CV: Denies chest pain, palpitations, irregular heartbeat, syncope, dyspnea, diaphoresis, orthopnea, PND, claudication or edema. Respiratory: denies cough, dyspnea, DOE, pleurisy, hoarseness, laryngitis, wheezing.  Gastrointestinal: Denies dysphagia, odynophagia, heartburn, reflux, water brash, abdominal pain or cramps, nausea, vomiting, bloating, diarrhea, constipation, hematemesis, melena, hematochezia  or hemorrhoids. Genitourinary: Denies dysuria, frequency, urgency, nocturia, hesitancy, discharge, hematuria or flank pain. Musculoskeletal: Denies arthralgias, myalgias, stiffness, jt. swelling, pain, limping or strain/sprain.  Skin: Denies pruritus, rash, hives, warts, acne, eczema or change in skin lesion(s). Neuro: No weakness, tremor, incoordination, spasms, paresthesia or pain. Psychiatric: Denies confusion, memory loss or sensory loss. Endo: Denies change in weight, skin or hair change.  Heme/Lymph: No excessive bleeding, bruising or enlarged lymph nodes.  Physical Exam  BP 118/72 mmHg  Pulse 76  Temp(Src) 97.7 F (36.5 C)  Resp 16  Ht 5' 10.25" (1.784 m)  Wt 172 lb 3.2 oz (78.109 kg)  BMI 24.54 kg/m2  Appears well nourished and in no distress. Eyes: PERRLA, EOMs, conjunctiva no swelling or erythema. Sinuses: No frontal/maxillary tenderness ENT/Mouth: EAC's clear, TM's nl w/o erythema, bulging. Nares clear w/o erythema, swelling, exudates. Oropharynx clear without erythema or exudates. Oral hygiene is good. Tongue normal, non obstructing. Hearing intact.  Neck: Supple. Thyroid nl. Car 2+/2+ without bruits, nodes or JVD. Chest: Respirations nl with BS clear & equal w/o rales, rhonchi, wheezing or stridor.  Cor: Heart sounds normal w/ regular rate and rhythm without sig. murmurs, gallops, clicks, or rubs. Peripheral pulses normal and equal   without edema.  Abdomen: Soft & bowel sounds normal. Non-tender w/o guarding, rebound, hernias, masses, or organomegaly.  Lymphatics: Unremarkable.  Musculoskeletal: Full ROM all peripheral extremities, joint stability, 5/5 strength, and normal gait.  Skin: Warm, dry without exposed rashes, lesions or ecchymosis apparent.  Neuro: Cranial nerves intact, reflexes equal bilaterally. Sensory-motor testing grossly intact. Tendon reflexes grossly intact.  Pysch: Alert & oriented x 3.  Insight and judgement nl & appropriate. No ideations.  Assessment and Plan:  1. HYPERTENSION, BENIGN  - TSH  2. Mixed hyperlipidemia  - Lipid panel - TSH  3. Prediabetes  - Insulin, random - Hemoglobin A1c  4. Vitamin D deficiency  - VITAMIN D 25 Hydroxy   5. Other abnormal glucose  - Insulin, random - Hemoglobin A1c  6. B12 deficiency anemia  - Vitamin B12  7. Medication management  - CBC with Differential/Platelet - BASIC METABOLIC PANEL WITH GFR - Hepatic function panel - Magnesium   Recommended regular exercise, BP monitoring, weight control, and discussed med and SE's. Recommended labs to assess and monitor clinical status. Further disposition pending results of labs. Over 30 minutes of exam, counseling, chart  review was performed

## 2015-06-30 NOTE — Patient Instructions (Signed)

## 2015-07-01 DIAGNOSIS — M1811 Unilateral primary osteoarthritis of first carpometacarpal joint, right hand: Secondary | ICD-10-CM | POA: Diagnosis not present

## 2015-07-01 DIAGNOSIS — M79644 Pain in right finger(s): Secondary | ICD-10-CM | POA: Diagnosis not present

## 2015-07-01 LAB — BASIC METABOLIC PANEL WITH GFR
BUN: 27 mg/dL — AB (ref 7–25)
CO2: 28 mmol/L (ref 20–31)
CREATININE: 1.39 mg/dL — AB (ref 0.70–1.11)
Calcium: 9.7 mg/dL (ref 8.6–10.3)
Chloride: 104 mmol/L (ref 98–110)
GFR, Est African American: 53 mL/min — ABNORMAL LOW (ref >=60)
GFR, Est Non African American: 46 mL/min — ABNORMAL LOW (ref >=60)
GLUCOSE: 98 mg/dL (ref 65–99)
POTASSIUM: 4.4 mmol/L (ref 3.5–5.3)
Sodium: 142 mmol/L (ref 135–146)

## 2015-07-01 LAB — HEPATIC FUNCTION PANEL
ALK PHOS: 61 U/L (ref 40–115)
ALT: 11 U/L (ref 9–46)
AST: 17 U/L (ref 10–35)
Albumin: 4.4 g/dL (ref 3.6–5.1)
Bilirubin, Direct: 0.1 mg/dL (ref <=0.2)
Indirect Bilirubin: 0.5 mg/dL (ref 0.2–1.2)
TOTAL PROTEIN: 6.7 g/dL (ref 6.1–8.1)
Total Bilirubin: 0.6 mg/dL (ref 0.2–1.2)

## 2015-07-01 LAB — TSH: TSH: 2.383 u[IU]/mL (ref 0.350–4.500)

## 2015-07-01 LAB — INSULIN, RANDOM: Insulin: 6.1 u[IU]/mL (ref 2.0–19.6)

## 2015-07-01 LAB — VITAMIN D 25 HYDROXY (VIT D DEFICIENCY, FRACTURES): VIT D 25 HYDROXY: 74 ng/mL (ref 30–100)

## 2015-07-01 LAB — LIPID PANEL
Cholesterol: 157 mg/dL (ref 125–200)
HDL: 30 mg/dL — ABNORMAL LOW (ref >=40)
LDL CALC: 89 mg/dL (ref <130)
Total CHOL/HDL Ratio: 5.2 Ratio — ABNORMAL HIGH (ref <=5.0)
Triglycerides: 192 mg/dL — ABNORMAL HIGH (ref <150)
VLDL: 38 mg/dL — ABNORMAL HIGH (ref <30)

## 2015-07-01 LAB — HEMOGLOBIN A1C
HEMOGLOBIN A1C: 5.4 % (ref <5.7)
MEAN PLASMA GLUCOSE: 108 mg/dL (ref <117)

## 2015-07-01 LAB — VITAMIN B12: Vitamin B-12: 419 pg/mL (ref 211–911)

## 2015-07-01 LAB — MAGNESIUM: MAGNESIUM: 2.1 mg/dL (ref 1.5–2.5)

## 2015-08-20 ENCOUNTER — Encounter: Payer: Self-pay | Admitting: Neurology

## 2015-08-20 ENCOUNTER — Ambulatory Visit (INDEPENDENT_AMBULATORY_CARE_PROVIDER_SITE_OTHER): Payer: Medicare Other | Admitting: Neurology

## 2015-08-20 VITALS — BP 110/70 | HR 86 | Ht 70.0 in | Wt 176.1 lb

## 2015-08-20 DIAGNOSIS — G3184 Mild cognitive impairment, so stated: Secondary | ICD-10-CM | POA: Diagnosis not present

## 2015-08-20 DIAGNOSIS — G250 Essential tremor: Secondary | ICD-10-CM | POA: Diagnosis not present

## 2015-08-20 DIAGNOSIS — F0390 Unspecified dementia without behavioral disturbance: Secondary | ICD-10-CM | POA: Insufficient documentation

## 2015-08-20 DIAGNOSIS — G609 Hereditary and idiopathic neuropathy, unspecified: Secondary | ICD-10-CM | POA: Diagnosis not present

## 2015-08-20 MED ORDER — RIVASTIGMINE 4.6 MG/24HR TD PT24: 4.6000 mg | MEDICATED_PATCH | Freq: Every day | TRANSDERMAL | Status: DC

## 2015-08-20 NOTE — Progress Notes (Signed)
Follow-up Visit   Date: 08/20/2015  Ryan Humphrey MRN: [id number] DOB: 02-28-1929   Interim History: Ryan Humphrey is a 82 y.o. right-handed Caucasian male with hyperlipidemia, arthritis, GERD, depression, prostate cancer, vitamin B12 deficiency, lumbar spine stenosis, and coronary artery disease returning to the clinic for follow-up of gait imbalance, new memory changes, and tremors.    History of present illness: The patient had been seeing Dr. Erling Cruz since 2013 for right-sided nonradiating neck pain. He had previous injections at [facility] pain clinic and by Dr. Mina Marble with no benefit. Most improvement was seen with diclofenac 75 mg twice daily, however due to side effects it was reduced to 50 mg twice daily. His neck pain slowly improved. There was also note of low back pain with radiation to the left lower extremity with associated numbness of the feet. At some point she was diagnosed with impaired fasting glucose. He was started gabapentin 300mg  TID and completed PT. He no longer has any pain, but continues to have problems with balance. His care was then transitioned to Dr. Lonni Fix and patient was last seen in May 2015 for bilateral leg cramps and burning paresthesias. He was taking gabapentin 300 mg 3 times daily. At that time, MRI studies as well as additional testing was discussed, however patient declined.  He has noticed increased problems with balance and finds himself using the furniture for assistance, especially at night time. He also feels as if his legs are weak. He has mild tingling of the toes at night. He has not had any falls, but does endorse stumbling at times. He walks independently. No similar symptoms of the hands.  UPDATE 04/29/2014:  He completed therapy and report no significant change, but was taught how to use the cane better.  He had on night of severe burning pain, but otherwise symptoms are relatively stable.  Denies any falls.  He reports to having  short-term memory problems and has to try harder to recall things, especially peoples names.  He also reports to have illegible hand writing and a tremor when he is trying to do fine motor tasks.  He is still living alone and taking care of his finances as well as driving without any difficulty.  UPDATE 07/29/2014:  He feels that his balance is stable without any worsening. His tremors have improved since start propranolol 20mg  daily. He has noticed vivid dreams since start Aricept, but they are not bothersome to stop it.  He continues to use his cane and has not had any falls. No new problems with driving or managing finances. He does not cook and eats most of his meals out.  He lives alone and his daughter visits daily.    UPDATE 08/20/2015:  He is here for one-year appointment.  He has no new complaints.  He is still living independently and manages all his day-to-day activities.  He does not cook very much and eats most of his meals out. He drives locally to familiar places and has not been involved in any MVAs.    He enjoys playing the piano for 3 hours per day and going to concerts.  He spends his time with his daughter, visiting his brother at memory unit, goes to church, and walks. Mood is good.   He developed vivid nightmares with Aricept, so stopped it.  He has one fall in the bathroom in 2016 and was eventually able to get up by himself.     Medications:  Current Outpatient Prescriptions  on File Prior to Visit  Medication Sig Dispense Refill  . alendronate (FOSAMAX) 70 MG tablet Take 70 mg by mouth every Saturday. Take with a full glass of water on an empty stomach.    Marland Kitchen aspirin EC 325 MG EC tablet Take 1 tablet (325 mg total) by mouth 2 (two) times daily after a meal. 30 tablet 0  . Calcium Carbonate-Vitamin D (CALTRATE 600+D PO) Take 1 tablet by mouth daily.     . Cholecalciferol (VITAMIN D) 2000 UNITS tablet Take 8,000 Units by mouth daily.    . diphenoxylate-atropine (LOMOTIL) 2.5-0.025 MG  per tablet Take 1 tablet by mouth 4 (four) times daily as needed for diarrhea or loose stools.    . gabapentin (NEURONTIN) 300 MG capsule Take 300 mg by mouth 2 (two) times daily.     . propranolol (INDERAL) 20 MG tablet Take 1 tablet (20 mg total) by mouth 2 (two) times daily. 60 tablet 3  . sertraline (ZOLOFT) 100 MG tablet Take 1 tablet (100 mg total) by mouth daily. 90 tablet 3  . simvastatin (ZOCOR) 20 MG tablet Take 20 mg by mouth every evening.     No current facility-administered medications on file prior to visit.    Allergies:  Allergies  Allergen Reactions  . Niacin Itching  . Penicillins Other (See Comments)    "rash"  . Tramadol Itching     Review of Systems:  CONSTITUTIONAL: No fevers, chills, night sweats, or weight loss.   EYES: No visual changes or eye pain ENT: No hearing changes.  No history of nose bleeds.   RESPIRATORY: No cough, wheezing and shortness of breath.   CARDIOVASCULAR: Negative for chest pain, and palpitations.   GI: Negative for abdominal discomfort, blood in stools or black stools.  No recent change in bowel habits.   GU:  No history of incontinence.   MUSCLOSKELETAL: No history of joint pain or swelling.  No myalgias.   SKIN: Negative for lesions, rash, and itching.   ENDOCRINE: Negative for cold or heat intolerance, polydipsia or goiter.   PSYCH:  No depression or anxiety symptoms.   NEURO: As Above.   Vital Signs:  BP 110/70 mmHg  Pulse 86  Ht 5\' 10"  (1.778 m)  Wt 176 lb 1 oz (79.861 kg)  BMI 25.26 kg/m2  SpO2 97%  Neurological Exam: MENTAL STATUS including orientation to time, place, person, recent and remote memory, attention span and concentration, language, and fund of knowledge is relatively intact.  Speech is not dysarthric.  Montreal Cognitive Assessment  08/20/2015 04/29/2014  Visuospatial/ Executive (0/5) 4 5  Naming (0/3) 2 2  Attention: Read list of digits (0/2) 2 2  Attention: Read list of letters (0/1) 1 1  Attention:  Serial 7 subtraction starting at 100 (0/3) 3 3  Language: Repeat phrase (0/2) 2 2  Language : Fluency (0/1) 1 1  Abstraction (0/2) 2 2  Delayed Recall (0/5) 0 3  Orientation (0/6) 6 6  Total 23 27  Adjusted Score (based on education) - 27    CRANIAL NERVES:  Pupils equal round and reactive to light.  Normal conjugate, extra-ocular eye movements in all directions of gaze.  No ptosis.  Face is symmetric. Palate elevates symmetrically.  Tongue is midline.  MOTOR:  Motor strength is 5/5 in all extremities. Tone is normal.  No postural or intention tremor.  SENSORY:  Reduced vibration distal to ankles bilaterally.  COORDINATION/GAIT:   Intact rapid alternating movements bilaterally.  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   10/28/10 MRI lumbar spine - mild to moderate spinal stenosis at L1-2, moderate to marked left-sided and moderate right-sided spinal stenosis at L2-3, moderate to marked spinal stenosis and bilateral lateral recess stenosis at L3-4, mild spinal stenosis greater on the right and mild left foraminal narrowing at L4-5, broad-based disc osteophyte ridge and left mild indentation of the thecal sac at L5-S1 with mild extension to the left but no compression of the L5 nerve root.  IMPRESSION/PLAN: 1.  Mild cognitive impairment, functionally stable and independent, but there has been a drop in his [place] from 2016 (27 > 23).  Formal psychological testing  declined at his visit.  Currently there are no home safety issues.  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  He should restrict driving to daytime, avoid heavy traffic times, and local distances.  Advanced Directive discussed. Information provided for [place] because he does live alone. He is doing remarkably well staying physically, mentally, and socially active which I encouraged.  For pharmacological management, we can start exelon 4.6mg  patch, since aricept was discontinued due to vivid nightmares.   2.  Benign essential tremors, stable.  Continue propranolol 20mg  BID  3.  Multifactorial gait instability from idiopathic peripheral neuropathy and known cervical and lumbar stenosis with myelopathy.  Continue to use cane, gait training declined.  Since his paresthesias are not very bothersome, he may try to reduce the gabapentin to 300mg  three times daily, if symptoms worsen, go back to taking 300-300-600   Return to clinic in 1 year or sooner as needed   The duration of this appointment visit was 25 minutes of face-to-face time with the patient.  Greater than 50% of this time was spent in counseling, explanation of diagnosis, planning of further management, and coordination of care.   Thank you for allowing me to participate in patient's care.  If I can answer any additional questions, I would be pleased to do so.    Sincerely,    Mauri Temkin K. Posey Pronto, DO

## 2015-08-20 NOTE — Patient Instructions (Addendum)
1.  Start exelon patch 2.  Continue propranolol 20mg  twice daily 3.  Try to reduce gabapentin 300mg  three times daily, if your tingling worsens, go back to taking 600mg  at bedtime 4.  Avoid driving during nighttime, heavy traffic times, and stay on familiar roads 5.  Return to clinic in 1 year   [place] that can locate patients outside the home:  http://mobilealertsystems.com/ 774-372-7312 http://www.lifelinesys.com/content/lifeline-products/get-life-[place]osafe  4102375317 www.verizonwireless.com/sure/ (747)530-3515  Medial Alert systems that link to smart phones:  http://www.lifelinesys.com/content/lifeline-products/response-app https://www.stanton.info/ RecycleRoad.pl.aspx  [employer] GPS Tracker:  VoiceZap.is 856-634-0550

## 2015-08-24 ENCOUNTER — Encounter: Payer: Self-pay | Admitting: *Deleted

## 2015-09-09 ENCOUNTER — Other Ambulatory Visit: Payer: Self-pay | Admitting: *Deleted

## 2015-09-09 MED ORDER — DIPHENOXYLATE-ATROPINE 2.5-0.025 MG PO TABS: 1.0000 | ORAL_TABLET | Freq: Four times a day (QID) | ORAL | Status: DC | PRN

## 2015-10-14 ENCOUNTER — Encounter: Payer: Self-pay | Admitting: Internal Medicine

## 2015-10-14 ENCOUNTER — Other Ambulatory Visit: Payer: Self-pay | Admitting: *Deleted

## 2015-10-14 ENCOUNTER — Ambulatory Visit (INDEPENDENT_AMBULATORY_CARE_PROVIDER_SITE_OTHER): Payer: Medicare Other | Admitting: Internal Medicine

## 2015-10-14 VITALS — BP 112/56 | HR 80 | Temp 97.7°F | Resp 16 | Ht 70.25 in | Wt 178.2 lb

## 2015-10-14 DIAGNOSIS — E559 Vitamin D deficiency, unspecified: Secondary | ICD-10-CM

## 2015-10-14 DIAGNOSIS — R7303 Prediabetes: Secondary | ICD-10-CM

## 2015-10-14 DIAGNOSIS — I10 Essential (primary) hypertension: Secondary | ICD-10-CM | POA: Diagnosis not present

## 2015-10-14 DIAGNOSIS — E782 Mixed hyperlipidemia: Secondary | ICD-10-CM

## 2015-10-14 DIAGNOSIS — Z79899 Other long term (current) drug therapy: Secondary | ICD-10-CM

## 2015-10-14 DIAGNOSIS — F4329 Adjustment disorder with other symptoms: Secondary | ICD-10-CM

## 2015-10-14 MED ORDER — SIMVASTATIN 20 MG PO TABS: 20.0000 mg | ORAL_TABLET | Freq: Every evening | ORAL | Status: DC

## 2015-10-14 MED ORDER — PROPRANOLOL HCL 20 MG PO TABS: 20.0000 mg | ORAL_TABLET | Freq: Two times a day (BID) | ORAL | Status: DC

## 2015-10-14 MED ORDER — ALENDRONATE SODIUM 70 MG PO TABS: 70.0000 mg | ORAL_TABLET | ORAL | Status: DC

## 2015-10-14 MED ORDER — SERTRALINE HCL 100 MG PO TABS: 100.0000 mg | ORAL_TABLET | Freq: Every day | ORAL | Status: DC

## 2015-10-14 MED ORDER — GABAPENTIN 300 MG PO CAPS: 300.0000 mg | ORAL_CAPSULE | Freq: Two times a day (BID) | ORAL | Status: DC

## 2015-10-14 MED ORDER — RIVASTIGMINE 4.6 MG/24HR TD PT24: 4.6000 mg | MEDICATED_PATCH | Freq: Every day | TRANSDERMAL | Status: DC

## 2015-10-14 MED ORDER — DIPHENOXYLATE-ATROPINE 2.5-0.025 MG PO TABS: 1.0000 | ORAL_TABLET | Freq: Four times a day (QID) | ORAL | Status: DC | PRN

## 2015-10-14 NOTE — Patient Instructions (Signed)

## 2015-10-14 NOTE — Progress Notes (Signed)
Patient ID: Ryan Humphrey, male   DOB: 03-03-1929, 82 y.o.   MRN: [id number] [facility]                    Unk Pinto, M.D.    Uvaldo Bristle. Silverio Lay, P.A.-C      Starlyn Skeans, P.A.-C   [facility]                [address] [place], [place] [id number] Telephone [phone] Telefax [phone] _________________________________   This very nice 82 y.o. WWM presents for  follow up with Hypertension, Hyperlipidemia, Pre-Diabetes and Vitamin D Deficiency.    Patient is monitored expectantly for labile HTN & BP has been controlled at home. Today's BP is 112/56. Patient has had no complaints of any cardiac type chest pain, palpitations, dyspnea/orthopnea/PND, dizziness, claudication, or dependent edema.   Hyperlipidemia is controlled with diet and Medications. Patient denies myalgias or other med SE's. Last Lipids were  Cholesterol 157; HDL 30*; LDL 89; but elevatedTriglycerides 192 on 06/30/2015.   Also, the patient has history of PreDiabetes and has had no symptoms of reactive hypoglycemia, diabetic polys, paresthesias or visual blurring.  Last A1c was  5.4% on 06/30/2015. Patient does have a peripheral neuropathy and a mild essential tremor. He also has been seen by Dr Posey Pronto for Dementia.    Further, the patient also has history of Vitamin D Deficiency and supplements vitamin D without any suspected side-effects. Last vitamin D was at goal with level of 74 on 06/30/2015.  Medication Sig  . aspirin EC 325 MG EC  Take 1 tablet (325 mg total) by mouth 2 (two) times daily after a meal.  . CALTRATE 600+D  Take 1 tablet by mouth daily.   Marland Kitchen VITAMIN D 2000 UNITS  Take 8,000 Units by mouth daily.  Marland Kitchen alendronate  70 MG Take 70 mg by mouth every Saturday. Take with a full glass of water on an empty stomach.  . LOMOTIL Take 1 tablet by mouth 4 (four) times daily as needed for diarrhea or loose stools.  . gabapentin   300 MG  Take 300 mg by mouth 2 (two) times daily.   . propranolol  20 MG Take 1 tablet (20 mg total) by mouth 2 (two) times daily.  . rivastigmine (EXELON) 4.6 mg/24hr Place 1 patch (4.6 mg total) onto the skin daily.  . Sertraline 100 MG tablet Take 1 tablet (100 mg total) by mouth daily.  . Simvastatin  20 MG tablet Take 20 mg by mouth every evening.   Allergies  Allergen Reactions  . Niacin Itching  . Penicillins Other (See Comments)    "rash"  . Tramadol Itching   PMHx:   Past Medical History  Diagnosis Date  . Neuropathy ([place])   . Hyperlipidemia   . Arthritis     arthritis,osteopenia,"spinal stenosis"  . GERD (gastroesophageal reflux disease)   . H/O hiatal hernia   . H/O vertigo 12-06-12    none recent  . Anemia   . Depression   . Cancer [facility]) 12-06-12    Prostate cancer'98  . Peripheral neuropathy ([place]) 12-06-12    peripheral neuropathy  . RLS (restless legs syndrome)   . Rhinitis   . CAD (coronary artery disease)   . Osteopenia   . Vitamin B12 deficiency   . IBS (irritable bowel syndrome)  Immunization History  Administered Date(s) Administered  . Influenza, High Dose Seasonal PF 03/05/2015   Past Surgical History  Procedure Laterality Date  . Appendectomy    . Cholecystectomy    . Carpal tunnel release      both hands  . Prostate surgery  12-06-12  . Cataract surgery Left 12-06-12    recent surgery  . Esophagogastroduodenoscopy (egd) with propofol N/A 12/25/2012    Procedure: ESOPHAGOGASTRODUODENOSCOPY (EGD) WITH PROPOFOL;  Surgeon: Garlan Fair, MD  . Colonoscopy with propofol N/A 12/25/2012    Procedure: COLONOSCOPY WITH PROPOFOL;  Surgeon: Garlan Fair, MD  . Tonsillectomy    . Total knee arthroplasty Right 11/25/2014     RIGHT TOTAL KNEE ARTHROPLASTY;  Surgeon: Melrose Nakayama, MD   FHx:    Reviewed / unchanged  SHx:    Reviewed / unchanged  Systems Review:  Constitutional: Denies fever, chills, wt changes, headaches, insomnia, fatigue,  night sweats, change in appetite. Eyes: Denies redness, blurred vision, diplopia, discharge, itchy, watery eyes.  ENT: Denies discharge, congestion, post nasal drip, epistaxis, sore throat, earache, hearing loss, dental pain, tinnitus, vertigo, sinus pain, snoring.  CV: Denies chest pain, palpitations, irregular heartbeat, syncope, dyspnea, diaphoresis, orthopnea, PND, claudication or edema. Respiratory: denies cough, dyspnea, DOE, pleurisy, hoarseness, laryngitis, wheezing.  Gastrointestinal: Denies dysphagia, odynophagia, heartburn, reflux, water brash, abdominal pain or cramps, nausea, vomiting, bloating, diarrhea, constipation, hematemesis, melena, hematochezia  or hemorrhoids. Genitourinary: Denies dysuria, frequency, urgency, nocturia, hesitancy, discharge, hematuria or flank pain. Musculoskeletal: Denies arthralgias, myalgias, stiffness, jt. swelling, pain, limping or strain/sprain.  Skin: Denies pruritus, rash, hives, warts, acne, eczema or change in skin lesion(s). Neuro: No weakness, tremor, incoordination, spasms, paresthesia or pain. Psychiatric: Denies confusion, memory loss or sensory loss. Endo: Denies change in weight, skin or hair change.  Heme/Lymph: No excessive bleeding, bruising or enlarged lymph nodes.  Physical Exam  BP 112/56 mmHg  Pulse 80  Temp(Src) 97.7 F (36.5 C)  Resp 16  Ht 5' 10.25" (1.784 m)  Wt 178 lb 3.2 oz (80.831 kg)  BMI 25.40 kg/m2  Appears well nourished and in no distress. Eyes: PERRLA, EOMs, conjunctiva no swelling or erythema. Sinuses: No frontal/maxillary tenderness ENT/Mouth: EAC's clear, TM's nl w/o erythema, bulging. Nares clear w/o erythema, swelling, exudates. Oropharynx clear without erythema or exudates. Oral hygiene is good. Tongue normal, non obstructing. Hearing intact.  Neck: Supple. Thyroid nl. Car 2+/2+ without bruits, nodes or JVD. Chest: Respirations nl with BS clear & equal w/o rales, rhonchi, wheezing or stridor.  Cor: Heart  sounds normal w/ regular rate and rhythm without sig. murmurs, gallops, clicks, or rubs. Peripheral pulses normal and equal  without edema.  Abdomen: Soft & bowel sounds normal. Non-tender w/o guarding, rebound, hernias, masses, or organomegaly.  Lymphatics: Unremarkable.  Musculoskeletal: Full ROM all peripheral extremities, joint stability, 5/5 strength, and normal gait.  Skin: Warm, dry without exposed rashes, lesions or ecchymosis apparent.  Neuro: Cranial nerves intact, reflexes equal bilaterally. Sensory-motor testing grossly intact. Tendon reflexes grossly intact. Mild tremor of hands. Pysch: Alert & oriented x 3.  Insight and judgement nl & appropriate. No ideations.  Assessment and Plan:  1. HYPERTENSION, BENIGN  - TSH  2. Mixed hyperlipidemia  - Lipid panel - TSH  3. Prediabetes  - Hemoglobin A1c - Insulin, random  4. Vitamin D deficiency  - VITAMIN D 25 Hydroxy   5. Medication management  - CBC with Differential/Platelet - BASIC METABOLIC PANEL WITH GFR - Hepatic function panel - Magnesium  Recommended regular exercise, BP monitoring, weight control, and discussed med and SE's. Recommended labs to assess and monitor clinical status. Further disposition pending results of labs. Over 30 minutes of exam, counseling, chart review was performed

## 2015-10-21 DIAGNOSIS — R7303 Prediabetes: Secondary | ICD-10-CM | POA: Diagnosis not present

## 2015-10-21 DIAGNOSIS — R7309 Other abnormal glucose: Secondary | ICD-10-CM | POA: Diagnosis not present

## 2015-10-21 DIAGNOSIS — E559 Vitamin D deficiency, unspecified: Secondary | ICD-10-CM | POA: Diagnosis not present

## 2015-10-21 DIAGNOSIS — Z79899 Other long term (current) drug therapy: Secondary | ICD-10-CM | POA: Diagnosis not present

## 2015-10-21 DIAGNOSIS — E782 Mixed hyperlipidemia: Secondary | ICD-10-CM | POA: Diagnosis not present

## 2015-10-21 DIAGNOSIS — I10 Essential (primary) hypertension: Secondary | ICD-10-CM | POA: Diagnosis not present

## 2015-10-22 LAB — CBC WITH DIFFERENTIAL/PLATELET
BASOS ABS: 0 {cells}/uL (ref 0–200)
Basophils Relative: 0 %
Eosinophils Absolute: 128 cells/uL (ref 15–500)
Eosinophils Relative: 2 %
HEMATOCRIT: 39.2 % (ref 38.5–50.0)
Hemoglobin: 13.1 g/dL — ABNORMAL LOW (ref 13.2–17.1)
LYMPHS PCT: 16 %
Lymphs Abs: 1024 cells/uL (ref 850–3900)
MCH: 31.9 pg (ref 27.0–33.0)
MCHC: 33.4 g/dL (ref 32.0–36.0)
MCV: 95.4 fL (ref 80.0–100.0)
MONO ABS: 512 {cells}/uL (ref 200–950)
MPV: 11.2 fL (ref 7.5–12.5)
Monocytes Relative: 8 %
Neutro Abs: 4736 cells/uL (ref 1500–7800)
Neutrophils Relative %: 74 %
Platelets: 137 10*3/uL — ABNORMAL LOW (ref 140–400)
RBC: 4.11 MIL/uL — AB (ref 4.20–5.80)
RDW: 13.1 % (ref 11.0–15.0)
WBC: 6.4 10*3/uL (ref 3.8–10.8)

## 2015-10-22 LAB — TSH: TSH: 2.21 mIU/L (ref 0.40–4.50)

## 2015-10-22 LAB — HEPATIC FUNCTION PANEL
ALT: 8 U/L — AB (ref 9–46)
AST: 14 U/L (ref 10–35)
Albumin: 4.4 g/dL (ref 3.6–5.1)
Alkaline Phosphatase: 57 U/L (ref 40–115)
Bilirubin, Direct: 0.1 mg/dL (ref <=0.2)
Indirect Bilirubin: 0.5 mg/dL (ref 0.2–1.2)
TOTAL PROTEIN: 6.3 g/dL (ref 6.1–8.1)
Total Bilirubin: 0.6 mg/dL (ref 0.2–1.2)

## 2015-10-22 LAB — LIPID PANEL
CHOLESTEROL: 128 mg/dL (ref 125–200)
HDL: 27 mg/dL — ABNORMAL LOW (ref >=40)
LDL Cholesterol: 70 mg/dL (ref <130)
TRIGLYCERIDES: 156 mg/dL — AB (ref <150)
Total CHOL/HDL Ratio: 4.7 Ratio (ref <=5.0)
VLDL: 31 mg/dL — ABNORMAL HIGH (ref <30)

## 2015-10-22 LAB — BASIC METABOLIC PANEL WITH GFR
BUN: 24 mg/dL (ref 7–25)
CALCIUM: 9.4 mg/dL (ref 8.6–10.3)
CO2: 27 mmol/L (ref 20–31)
Chloride: 108 mmol/L (ref 98–110)
Creat: 1.5 mg/dL — ABNORMAL HIGH (ref 0.70–1.11)
GFR, EST NON AFRICAN AMERICAN: 42 mL/min — AB (ref >=60)
GFR, Est African American: 49 mL/min — ABNORMAL LOW (ref >=60)
GLUCOSE: 108 mg/dL — AB (ref 65–99)
Potassium: 4.4 mmol/L (ref 3.5–5.3)
Sodium: 144 mmol/L (ref 135–146)

## 2015-10-22 LAB — MAGNESIUM: Magnesium: 1.9 mg/dL (ref 1.5–2.5)

## 2015-10-22 LAB — INSULIN, RANDOM: Insulin: 31.6 u[IU]/mL — ABNORMAL HIGH (ref 2.0–19.6)

## 2015-10-22 LAB — VITAMIN D 25 HYDROXY (VIT D DEFICIENCY, FRACTURES): Vit D, 25-Hydroxy: 67 ng/mL (ref 30–100)

## 2015-10-22 LAB — HEMOGLOBIN A1C
HEMOGLOBIN A1C: 5.5 % (ref <5.7)
MEAN PLASMA GLUCOSE: 111 mg/dL

## 2015-11-20 DIAGNOSIS — Z96651 Presence of right artificial knee joint: Secondary | ICD-10-CM | POA: Diagnosis not present

## 2015-11-30 DIAGNOSIS — N5231 Erectile dysfunction following radical prostatectomy: Secondary | ICD-10-CM | POA: Diagnosis not present

## 2015-11-30 DIAGNOSIS — Z8546 Personal history of malignant neoplasm of prostate: Secondary | ICD-10-CM | POA: Diagnosis not present

## 2015-11-30 DIAGNOSIS — C61 Malignant neoplasm of prostate: Secondary | ICD-10-CM | POA: Diagnosis not present

## 2015-11-30 DIAGNOSIS — N320 Bladder-neck obstruction: Secondary | ICD-10-CM | POA: Diagnosis not present

## 2015-11-30 DIAGNOSIS — N393 Stress incontinence (female) (male): Secondary | ICD-10-CM | POA: Diagnosis not present

## 2016-01-20 ENCOUNTER — Encounter: Payer: Self-pay | Admitting: Internal Medicine

## 2016-01-20 ENCOUNTER — Ambulatory Visit (INDEPENDENT_AMBULATORY_CARE_PROVIDER_SITE_OTHER): Payer: Medicare Other | Admitting: Internal Medicine

## 2016-01-20 VITALS — BP 126/60 | HR 66 | Temp 98.0°F | Resp 16 | Ht 70.25 in | Wt 180.0 lb

## 2016-01-20 DIAGNOSIS — G250 Essential tremor: Secondary | ICD-10-CM | POA: Diagnosis not present

## 2016-01-20 DIAGNOSIS — Z23 Encounter for immunization: Secondary | ICD-10-CM | POA: Diagnosis not present

## 2016-01-20 DIAGNOSIS — C61 Malignant neoplasm of prostate: Secondary | ICD-10-CM | POA: Diagnosis not present

## 2016-01-20 DIAGNOSIS — R7303 Prediabetes: Secondary | ICD-10-CM

## 2016-01-20 DIAGNOSIS — R42 Dizziness and giddiness: Secondary | ICD-10-CM | POA: Diagnosis not present

## 2016-01-20 DIAGNOSIS — N320 Bladder-neck obstruction: Secondary | ICD-10-CM

## 2016-01-20 DIAGNOSIS — E559 Vitamin D deficiency, unspecified: Secondary | ICD-10-CM | POA: Diagnosis not present

## 2016-01-20 DIAGNOSIS — E782 Mixed hyperlipidemia: Secondary | ICD-10-CM | POA: Diagnosis not present

## 2016-01-20 DIAGNOSIS — M1711 Unilateral primary osteoarthritis, right knee: Secondary | ICD-10-CM | POA: Diagnosis not present

## 2016-01-20 DIAGNOSIS — G3184 Mild cognitive impairment, so stated: Secondary | ICD-10-CM | POA: Diagnosis not present

## 2016-01-20 DIAGNOSIS — I2510 Atherosclerotic heart disease of native coronary artery without angina pectoris: Secondary | ICD-10-CM

## 2016-01-20 DIAGNOSIS — Z79899 Other long term (current) drug therapy: Secondary | ICD-10-CM | POA: Diagnosis not present

## 2016-01-20 DIAGNOSIS — R6889 Other general symptoms and signs: Secondary | ICD-10-CM | POA: Diagnosis not present

## 2016-01-20 DIAGNOSIS — Z0000 Encounter for general adult medical examination without abnormal findings: Secondary | ICD-10-CM

## 2016-01-20 DIAGNOSIS — Z0001 Encounter for general adult medical examination with abnormal findings: Secondary | ICD-10-CM | POA: Diagnosis not present

## 2016-01-20 DIAGNOSIS — I10 Essential (primary) hypertension: Secondary | ICD-10-CM | POA: Diagnosis not present

## 2016-01-20 DIAGNOSIS — G609 Hereditary and idiopathic neuropathy, unspecified: Secondary | ICD-10-CM | POA: Diagnosis not present

## 2016-01-20 LAB — TSH: TSH: 2.13 m[IU]/L (ref 0.40–4.50)

## 2016-01-20 LAB — CBC WITH DIFFERENTIAL/PLATELET
BASOS ABS: 0 {cells}/uL (ref 0–200)
Basophils Relative: 0 %
EOS ABS: 124 {cells}/uL (ref 15–500)
EOS PCT: 2 %
HCT: 39.2 % (ref 38.5–50.0)
HEMOGLOBIN: 13 g/dL — AB (ref 13.2–17.1)
LYMPHS ABS: 992 {cells}/uL (ref 850–3900)
Lymphocytes Relative: 16 %
MCH: 31.9 pg (ref 27.0–33.0)
MCHC: 33.2 g/dL (ref 32.0–36.0)
MCV: 96.3 fL (ref 80.0–100.0)
MPV: 10.8 fL (ref 7.5–12.5)
Monocytes Absolute: 434 cells/uL (ref 200–950)
Monocytes Relative: 7 %
NEUTROS ABS: 4650 {cells}/uL (ref 1500–7800)
Neutrophils Relative %: 75 %
Platelets: 138 10*3/uL — ABNORMAL LOW (ref 140–400)
RBC: 4.07 MIL/uL — ABNORMAL LOW (ref 4.20–5.80)
RDW: 13.9 % (ref 11.0–15.0)
WBC: 6.2 10*3/uL (ref 3.8–10.8)

## 2016-01-20 MED ORDER — DIPHENOXYLATE-ATROPINE 2.5-0.025 MG PO TABS: 1.0000 | ORAL_TABLET | Freq: Four times a day (QID) | ORAL | 1 refills | Status: DC

## 2016-01-20 MED ORDER — HYOSCYAMINE SULFATE 0.125 MG SL SUBL: 0.1250 mg | SUBLINGUAL_TABLET | SUBLINGUAL | 0 refills | Status: DC | PRN

## 2016-01-20 NOTE — Progress Notes (Signed)
MEDICARE ANNUAL WELLNESS VISIT AND FOLLOW UP Assessment:    1. Essential hypertension, benign -well controlled -monitor at home -dash diet -exercise as tolerated - TSH  2. Atherosclerosis of native coronary artery of native heart without angina pectoris -cont dash diet -d/c statin due to age  80. MCI (mild cognitive impairment) -cont exelon -followed by neurology  4. Hereditary and idiopathic peripheral neuropathy -followed by neurology  5. Benign essential tremor -cont propranolol  6. Primary osteoarthritis of right knee -weight control -tylenol prn  7. Bladder neck obstruction -followed by urology  8. Malignant neoplasm of prostate ([place]) -followed by urology  9. Mixed hyperlipidemia D/c statin - Lipid panel  10. INTERMITTENT VERTIGO -cont meds prn  11. Vitamin D deficiency -cont Vit D  12. Prediabetes  - Hemoglobin A1c  13. Medication management  - CBC with Differential/Platelet - BASIC METABOLIC PANEL WITH GFR - Hepatic function panel  14. Need for prophylactic vaccination against Streptococcus pneumoniae (pneumococcus)  - Pneumococcal polysaccharide vaccine 23-valent greater than or equal to 2yo subcutaneous/IM    Over 30 minutes of exam, counseling, chart review, and critical decision making was performed  Future Appointments Date Time Provider [place]  04/22/2016 11:15 AM Unk Pinto, MD [facility] None  08/19/2016 9:30 AM Arvin Collard Keith Rake, DO [facility] None     Plan:   During the course of the visit the patient was educated and counseled about appropriate screening and preventive services including:    Pneumococcal vaccine   Influenza vaccine  Prevnar 13  Td vaccine  Screening electrocardiogram  Colorectal cancer screening  Diabetes screening  Glaucoma screening  Nutrition counseling    Subjective:  Ryan Humphrey is a 82 y.o. male who presents for Medicare Annual Wellness Visit and 3 month follow up for  HTN, hyperlipidemia, prediabetes, and vitamin D Def.   His blood pressure has been controlled at home, today their BP is BP: 126/60 He does not workout. He denies chest pain, shortness of breath, dizziness.   He is on cholesterol medication and denies myalgias. His cholesterol is at goal. The cholesterol last visit was:   Lab Results  Component Value Date   CHOL 128 10/21/2015   HDL 27 (L) 10/21/2015   LDLCALC 70 10/21/2015   TRIG 156 (H) 10/21/2015   CHOLHDL 4.7 10/21/2015   Patient does not have a history of diabetes or prediabetes.  He continue to eat a healthy diet.   Lab Results  Component Value Date   HGBA1C 5.5 10/21/2015   Last GFR Lab Results  Component Value Date   GFRNONAA 42 (L) 10/21/2015     Lab Results  Component Value Date   GFRAA 49 (L) 10/21/2015   Patient is on Vitamin D supplement.   Lab Results  Component Value Date   VD25OH 70 10/21/2015     He reports that he has been having some issues with explosive bowel movements.  He reports that sometimes he can barely make it to the bathroom.  The Lomotil does help.  He reports that does help.  He reports that this can be difficult to travel.    Medication Review: Current Outpatient Prescriptions on File Prior to Visit  Medication Sig Dispense Refill  . alendronate (FOSAMAX) 70 MG tablet Take 1 tablet (70 mg total) by mouth every Saturday. Take with a full glass of water on an empty stomach. 12 tablet 3  . aspirin EC 325 MG EC tablet Take 1 tablet (325 mg total) by mouth 2 (two)  times daily after a meal. 30 tablet 0  . Calcium Carbonate-Vitamin D (CALTRATE 600+D PO) Take 1 tablet by mouth daily.     . Cholecalciferol (VITAMIN D) 2000 UNITS tablet Take 8,000 Units by mouth daily.    . propranolol (INDERAL) 20 MG tablet Take 1 tablet (20 mg total) by mouth 2 (two) times daily. 180 tablet 3  . rivastigmine (EXELON) 4.6 mg/24hr Place 1 patch (4.6 mg total) onto the skin daily. 90 patch 3  . sertraline (ZOLOFT)  100 MG tablet Take 1 tablet (100 mg total) by mouth daily. 90 tablet 3  . simvastatin (ZOCOR) 20 MG tablet Take 1 tablet (20 mg total) by mouth every evening. 90 tablet 3   No current facility-administered medications on file prior to visit.     Allergies: Allergies  Allergen Reactions  . Niacin Itching  . Penicillins Other (See Comments)    "rash"  . Tramadol Itching    Current Problems (verified) has Mixed hyperlipidemia; Essential hypertension, benign; CORONARY ATHEROSCLEROSIS NATIVE CORONARY ARTERY; INTERMITTENT VERTIGO; Primary osteoarthritis of right knee; Vitamin D deficiency; Prediabetes; Medication management; MCI (mild cognitive impairment); Hereditary and idiopathic peripheral neuropathy; Benign essential tremor; Bladder neck obstruction; and Malignant neoplasm of prostate ([place]) on his problem list.  Screening Tests Immunization History  Administered Date(s) Administered  . Influenza, High Dose Seasonal PF 03/05/2015  . Pneumococcal Polysaccharide-23 01/20/2016    Preventative care: Last colonoscopy: 2014  Prior vaccinations: TD or Tdap: Declined Due to cost Influenza: Due in September  Pneumococcal: 2016 Prevnar13: Today Shingles/Zostavax: Delcined  Names of Other Physician/Practitioners you currently use: 1. [facility] here for primary care 2. [facility], eye doctor, last visit 2016 3. [facility], dentist, last visit 2017 Patient Care Team: Unk Pinto, MD as PCP - General (Internal Medicine)  Surgical: He  has a past surgical history that includes Appendectomy; Cholecystectomy; Carpal tunnel release; ostate surgery (12-06-12); cataract surgery (Left, 12-06-12); Esophagogastroduodenoscopy (egd) with propofol (N/A, 12/25/2012); Colonoscopy with propofol (N/A, 12/25/2012); Tonsillectomy; and Total knee arthroplasty (Right, 11/25/2014). Family His family history includes CAD in his father; Dementia in  his brother and mother; Diabetes in his brother; Hyperlipidemia in his brother; Hypertension in his brother. Social history  He reports that he quit smoking about 52 years ago. He has a 15.00 pack-year smoking history. He has never used smokeless tobacco. He reports that he does not drink alcohol or use drugs.  MEDICARE WELLNESS OBJECTIVES: Physical activity: Current Exercise Habits: Home exercise routine, Type of exercise: walking, Time (Minutes): 20, Frequency (Times/Week): 5, Weekly Exercise (Minutes/Week): 100, Intensity: Moderate Cardiac risk factors: Cardiac Risk Factors include: advanced age (>32men, >74 women);dyslipidemia;male gender;family history of premature cardiovascular disease Depression/mood screen:   Depression screen [facility] 2/9 01/20/2016  Decreased Interest 0  Down, Depressed, Hopeless 0  PHQ - 2 Score 0    ADLs:  In your present state of health, do you have any difficulty performing the following activities: 01/20/2016 10/14/2015  Hearing? N N  Vision? N N  Difficulty concentrating or making decisions? Y N  Walking or climbing stairs? N N  Dressing or bathing? N N  Doing errands, shopping? N N  Preparing Food and eating ? N -  Using the Toilet? N -  In the past six months, have you accidently leaked urine? N -  Do you have problems with loss of bowel control? N -  Managing your Medications? N -  Managing your Finances? N -  Housekeeping  or managing your Housekeeping? N -  Some recent data might be hidden     Cognitive Testing  Alert? Yes  Normal Appearance?Yes  Oriented to person? Yes  Place? Yes   Time? Yes  Recall of three objects?  Yes  Can perform simple calculations? Yes  Displays appropriate judgment?Yes  Can read the correct time from a watch face?Yes  EOL planning: Does patient have an advance directive?: Yes Type of Advance Directive: Living will, Healthcare Power of Attorney Does patient want to make changes to advanced directive?: No - Patient  declined Copy of advanced directive(s) in chart?: No - copy requested   Objective:   Today's Vitals   01/20/16 1421  BP: 126/60  Pulse: 66  Resp: 16  Temp: 98 F (36.7 C)  TempSrc: Temporal  Weight: 180 lb (81.6 kg)  Height: 5' 10.25" (1.784 m)   Body mass index is 25.64 kg/m.  General appearance: alert, no distress, WD/WN, male HEENT: normocephalic, sclerae anicteric, TMs pearly, nares patent, no discharge or erythema, pharynx normal Oral cavity: MMM, no lesions Neck: supple, no lymphadenopathy, no thyromegaly, no masses Heart: RRR, normal S1, S2, no murmurs Lungs: CTA bilaterally, no wheezes, rhonchi, or rales Abdomen: +bs, soft, non tender, non distended, no masses, no hepatomegaly, no splenomegaly Musculoskeletal: nontender, no swelling, no obvious deformity Extremities: no edema, no cyanosis, no clubbing Pulses: 2+ symmetric, upper and lower extremities, normal cap refill Neurological: alert, oriented x 3, CN2-12 intact, strength normal upper extremities and lower extremities, sensation normal throughout, DTRs 2+ throughout, no cerebellar signs, gait normal Psychiatric: normal affect, behavior normal, pleasant   Medicare Attestation I have personally reviewed: The patient's medical and social history Their use of alcohol, tobacco or illicit drugs Their current medications and supplements The patient's functional ability including ADLs,fall risks, home safety risks, cognitive, and hearing and visual impairment Diet and physical activities Evidence for depression or mood disorders  The patient's weight, height, BMI, and visual acuity have been recorded in the chart.  I have made referrals, counseling, and provided education to the patient based on review of the above and I have provided the patient with a written personalized care plan for preventive services.     Starlyn Skeans, PA-C   01/20/2016

## 2016-01-21 LAB — HEPATIC FUNCTION PANEL
ALT: 8 U/L — AB (ref 9–46)
AST: 14 U/L (ref 10–35)
Albumin: 4.1 g/dL (ref 3.6–5.1)
Alkaline Phosphatase: 62 U/L (ref 40–115)
BILIRUBIN DIRECT: 0.1 mg/dL (ref <=0.2)
Indirect Bilirubin: 0.4 mg/dL (ref 0.2–1.2)
Total Bilirubin: 0.5 mg/dL (ref 0.2–1.2)
Total Protein: 6.1 g/dL (ref 6.1–8.1)

## 2016-01-21 LAB — BASIC METABOLIC PANEL WITH GFR
BUN: 21 mg/dL (ref 7–25)
CHLORIDE: 105 mmol/L (ref 98–110)
CO2: 27 mmol/L (ref 20–31)
CREATININE: 1.45 mg/dL — AB (ref 0.70–1.11)
Calcium: 9.2 mg/dL (ref 8.6–10.3)
GFR, Est African American: 51 mL/min — ABNORMAL LOW (ref >=60)
GFR, Est Non African American: 44 mL/min — ABNORMAL LOW (ref >=60)
Glucose, Bld: 110 mg/dL — ABNORMAL HIGH (ref 65–99)
Potassium: 3.9 mmol/L (ref 3.5–5.3)
SODIUM: 142 mmol/L (ref 135–146)

## 2016-01-21 LAB — LIPID PANEL
CHOLESTEROL: 187 mg/dL (ref 125–200)
HDL: 33 mg/dL — ABNORMAL LOW (ref >=40)
LDL CALC: 98 mg/dL (ref <130)
Total CHOL/HDL Ratio: 5.7 Ratio — ABNORMAL HIGH (ref <=5.0)
Triglycerides: 281 mg/dL — ABNORMAL HIGH (ref <150)
VLDL: 56 mg/dL — AB (ref <30)

## 2016-01-21 LAB — HEMOGLOBIN A1C
HEMOGLOBIN A1C: 5.2 % (ref <5.7)
MEAN PLASMA GLUCOSE: 103 mg/dL

## 2016-02-18 DIAGNOSIS — L570 Actinic keratosis: Secondary | ICD-10-CM | POA: Diagnosis not present

## 2016-02-18 DIAGNOSIS — L821 Other seborrheic keratosis: Secondary | ICD-10-CM | POA: Diagnosis not present

## 2016-02-18 DIAGNOSIS — Z85828 Personal history of other malignant neoplasm of skin: Secondary | ICD-10-CM | POA: Diagnosis not present

## 2016-02-18 DIAGNOSIS — D225 Melanocytic nevi of trunk: Secondary | ICD-10-CM | POA: Diagnosis not present

## 2016-02-18 DIAGNOSIS — D1801 Hemangioma of skin and subcutaneous tissue: Secondary | ICD-10-CM | POA: Diagnosis not present

## 2016-03-01 DIAGNOSIS — C61 Malignant neoplasm of prostate: Secondary | ICD-10-CM | POA: Diagnosis not present

## 2016-04-16 ENCOUNTER — Other Ambulatory Visit: Payer: Self-pay | Admitting: Internal Medicine

## 2016-04-22 ENCOUNTER — Ambulatory Visit (INDEPENDENT_AMBULATORY_CARE_PROVIDER_SITE_OTHER): Payer: Medicare Other | Admitting: Internal Medicine

## 2016-04-22 ENCOUNTER — Encounter: Payer: Self-pay | Admitting: Internal Medicine

## 2016-04-22 VITALS — BP 126/70 | HR 80 | Temp 97.3°F | Resp 16 | Ht 70.0 in | Wt 181.0 lb

## 2016-04-22 DIAGNOSIS — Z136 Encounter for screening for cardiovascular disorders: Secondary | ICD-10-CM

## 2016-04-22 DIAGNOSIS — R7309 Other abnormal glucose: Secondary | ICD-10-CM

## 2016-04-22 DIAGNOSIS — Z1212 Encounter for screening for malignant neoplasm of rectum: Secondary | ICD-10-CM

## 2016-04-22 DIAGNOSIS — Z79899 Other long term (current) drug therapy: Secondary | ICD-10-CM | POA: Diagnosis not present

## 2016-04-22 DIAGNOSIS — I2510 Atherosclerotic heart disease of native coronary artery without angina pectoris: Secondary | ICD-10-CM

## 2016-04-22 DIAGNOSIS — E782 Mixed hyperlipidemia: Secondary | ICD-10-CM

## 2016-04-22 DIAGNOSIS — Z125 Encounter for screening for malignant neoplasm of prostate: Secondary | ICD-10-CM

## 2016-04-22 DIAGNOSIS — R7303 Prediabetes: Secondary | ICD-10-CM | POA: Diagnosis not present

## 2016-04-22 DIAGNOSIS — I10 Essential (primary) hypertension: Secondary | ICD-10-CM

## 2016-04-22 DIAGNOSIS — N320 Bladder-neck obstruction: Secondary | ICD-10-CM | POA: Diagnosis not present

## 2016-04-22 DIAGNOSIS — E559 Vitamin D deficiency, unspecified: Secondary | ICD-10-CM

## 2016-04-22 DIAGNOSIS — Z23 Encounter for immunization: Secondary | ICD-10-CM | POA: Diagnosis not present

## 2016-04-22 LAB — CBC WITH DIFFERENTIAL/PLATELET
BASOS ABS: 0 {cells}/uL (ref 0–200)
Basophils Relative: 0 %
EOS ABS: 177 {cells}/uL (ref 15–500)
Eosinophils Relative: 3 %
HEMATOCRIT: 40.3 % (ref 38.5–50.0)
HEMOGLOBIN: 13.4 g/dL (ref 13.2–17.1)
LYMPHS ABS: 1003 {cells}/uL (ref 850–3900)
Lymphocytes Relative: 17 %
MCH: 31.9 pg (ref 27.0–33.0)
MCHC: 33.3 g/dL (ref 32.0–36.0)
MCV: 96 fL (ref 80.0–100.0)
MONOS PCT: 6 %
MPV: 10.9 fL (ref 7.5–12.5)
Monocytes Absolute: 354 cells/uL (ref 200–950)
NEUTROS ABS: 4366 {cells}/uL (ref 1500–7800)
Neutrophils Relative %: 74 %
Platelets: 141 10*3/uL (ref 140–400)
RBC: 4.2 MIL/uL (ref 4.20–5.80)
RDW: 13.3 % (ref 11.0–15.0)
WBC: 5.9 10*3/uL (ref 3.8–10.8)

## 2016-04-22 LAB — TSH: TSH: 2.99 m[IU]/L (ref 0.40–4.50)

## 2016-04-22 NOTE — Patient Instructions (Signed)

## 2016-04-22 NOTE — Progress Notes (Signed)
[facility]   Unk Pinto, M.D.    Uvaldo Bristle. Silverio Lay, P.A.-C      Starlyn Skeans, P.A.-C  [address] [id number] Telephone [phone] Comprehensive Evaluation & Examination     This very nice 82 y.o. [facility] presents for a comprehensive evaluation and management of multiple medical co-morbidities.  Patient has been followed for HTN, Prediabetes, Hyperlipidemia and Vitamin D Deficiency. Other problems include a remote hx/o prostate cancer in 1998  s/p TURP and post-op Radiation. Patient had a 2sd TURP in 2006.      Patient also has hx/ peripheral sensory neuropathy and mild familial tremor and also mild Dementia for which he's been followed by Dr Posey Pronto and patient had been rx'd Exelon patch that he is not taking.      Patient has hx/o labile HTN circa 1990's and is followed expectantly.  Patient had a negative and Normal Stress Myoview in Jan 2016 by Dr Dorris Carnes. Patient's BP has been controlled at home. Today's BP 126/70. Patient denies any cardiac symptoms as chest pain, palpitations, shortness of breath, dizziness or ankle swelling.     Patient's hyperlipidemia is controlled with diet and medications. Patient denies myalgias or other medication SE's. Last lipids were at goal albeit elevated Trig's:  Lab Results  Component Value Date   CHOL 187 01/20/2016   HDL 33 (L) 01/20/2016   LDLCALC 98 01/20/2016   TRIG 281 (H) 01/20/2016   CHOLHDL 5.7 (H) 01/20/2016      Patient is screened proactively for prediabetes and patient denies reactive hypoglycemic symptoms, visual blurring, diabetic polys or paresthesias. Last A1c was at goal: Lab Results  Component Value Date   HGBA1C 5.2 01/20/2016       Finally, patient has history of Vitamin D Deficiency. He doe s have hx/o Osterporosis and is on Fosamax for 3 years ( circa 2013) and note that had a TKR in June 2016  for severe in   In July 2016 of "42" on treatment and last vitamin D was at goal: Lab Results  Component Value Date   VD25OH 67 10/21/2015   Current Outpatient Prescriptions on File Prior to Visit  Medication Sig  . alendronate 70 MGt Take 1 tab every week  . aspirin EC 325 MG EC Take 1 tab  daily after a meal.  . CALTRATE 600+D  Take 1 tablet by mouth daily.   Marland Kitchen VITAMIN D  Take 8,000 Units by mouth daily.  . LOMOTIL TAKE 1-2 tab 4 x/da if needed for Diarrhea   . gabapentin  400 MG  Take  3  times daily.  . propranolol  20 MG Take 1 tab 2 times daily.  . sertraline  100 MG  Take 1 tab daily.  . simvastatin 20 MG  Take 1 tab every evening.   Allergies  Allergen Reactions  . Niacin Itching  . Penicillins Other (See Comments)    "rash"  . Tramadol Itching   Past Medical History:  Diagnosis Date  . Anemia   . Arthritis    arthritis,osteopenia,"spinal stenosis"  . CAD (coronary artery disease)   . Cancer [facility]) 12-06-12   Prostate cancer'98  . Depression   . GERD (gastroesophageal reflux disease)   . H/O hiatal hernia   .  H/O vertigo 12-06-12   none recent  . Hyperlipidemia   . IBS (irritable bowel syndrome)   . Neuropathy ([place])   . Osteopenia   . Peripheral neuropathy ([place]) 12-06-12   peripheral neuropathy  . Rhinitis   . RLS (restless legs syndrome)   . Vitamin B12 deficiency    Health Maintenance  Topic Date Due  . TETANUS/TDAP  03/26/1950  . ZOSTAVAX  03/27/1991  . INFLUENZA VACCINE  01/12/2016  . PNA vac Low Risk Adult (2 of 2 - PCV13) 01/19/2017   Immunization History  Administered Date(s) Administered  . Influenza, High Dose Seasonal PF 03/05/2015  . Pneumococcal Polysaccharide-23 01/20/2016   Past Surgical History:  Procedure Laterality Date  . APPENDECTOMY    . CARPAL TUNNEL RELEASE     both hands  . cataract surgery Left 12-06-12   recent surgery  . CHOLECYSTECTOMY    . COLONOSCOPY WITH PROPOFOL N/A 12/25/2012   Procedure: COLONOSCOPY WITH PROPOFOL;   Surgeon: Garlan Fair, MD;  Location: [facility];  Service: Endoscopy;  Laterality: N/A;  . ESOPHAGOGASTRODUODENOSCOPY (EGD) WITH PROPOFOL N/A 12/25/2012   Procedure: ESOPHAGOGASTRODUODENOSCOPY (EGD) WITH PROPOFOL;  Surgeon: Garlan Fair, MD;  Location: [facility];  Service: Endoscopy;  Laterality: N/A;  . PROSTATE SURGERY  12-06-12  . TONSILLECTOMY    . TOTAL KNEE ARTHROPLASTY Right 11/25/2014   Procedure: RIGHT TOTAL KNEE ARTHROPLASTY;  Surgeon: Melrose Nakayama, MD;  Location: [facility];  Service: Orthopedics;  Laterality: Right;   Family History  Problem Relation Age of Onset  . CAD Father     Deceased, 59  . Dementia Mother     Deceased, 60  . Colon cancer Neg Hx   . Colon polyps Neg Hx   . Kidney disease Neg Hx   . Esophageal cancer Neg Hx   . Gallbladder disease Neg Hx   . Diabetes Brother   . Hyperlipidemia Brother   . Hypertension Brother   . Dementia Brother    Social History   Social History  . Marital status: Widowed    Spouse name: N/A  . Number of children: 2  . Years of education: College   Occupational History  . Retired    Social History Main Topics  . Smoking status: Former Smoker    Packs/day: 0.50    Years: 30.00    Quit date: 06/14/1963  . Smokeless tobacco: Never Used  . Alcohol use No  . Drug use: No  . Sexual activity: No    Social History Narrative   Pt lives at home alone, widowed in 2011, married for 55 years. They have two grown daugthers.   Plays the piano.   Caffeine Use: Rarely    ROS Constitutional: Denies fever, chills, weight loss/gain, headaches, insomnia,  night sweats or change in appetite. Does c/o fatigue. Eyes: Denies redness, blurred vision, diplopia, discharge, itchy or watery eyes.  ENT: Denies discharge, congestion, post nasal drip, epistaxis, sore throat, earache, hearing loss, dental pain, Tinnitus, Vertigo, Sinus pain or snoring.  Cardio: Denies chest pain, palpitations, irregular heartbeat, syncope, dyspnea,  diaphoresis, orthopnea, PND, claudication or edema Respiratory: denies cough, dyspnea, DOE, pleurisy, hoarseness, laryngitis or wheezing.  Gastrointestinal: Denies dysphagia, heartburn, reflux, water brash, pain, cramps, nausea, vomiting, bloating, diarrhea, constipation, hematemesis, melena, hematochezia, jaundice or hemorrhoids Genitourinary: Denies dysuria, frequency, urgency, nocturia, hesitancy, discharge, hematuria or flank pain Musculoskeletal: Denies arthralgia, myalgia, stiffness, Jt. Swelling, pain, limp or strain/sprain. Denies Falls. Skin: Denies puritis, rash, hives, warts, acne, eczema or change in  skin lesion Neuro: No weakness, tremor, incoordination, spasms, paresthesia or pain Psychiatric: Denies confusion, memory loss or sensory loss. Denies Depression. Endocrine: Denies change in weight, skin, hair change, nocturia, and paresthesia, diabetic polys, visual blurring or hyper / hypo glycemic episodes.  Heme/Lymph: No excessive bleeding, bruising or enlarged lymph nodes.  Physical Exam  BP 126/70   Pulse 80   Temp 97.3 F (36.3 C)   Resp 16   Ht 5\' 10"  (1.778 m)   Wt 181 lb (82.1 kg)   BMI 25.97 kg/m   General Appearance: Well nourished, in no apparent distress.  Eyes: PERRLA, EOMs, conjunctiva no swelling or erythema, normal fundi and vessels. Sinuses: No frontal/maxillary tenderness ENT/Mouth: EACs patent / TMs  nl. Nares clear without erythema, swelling, mucoid exudates. Oral hygiene is good. No erythema, swelling, or exudate. Tongue normal, non-obstructing. Tonsils not swollen or erythematous. Hearing normal.  Neck: Supple, thyroid normal. No bruits, nodes or JVD. Respiratory: Respiratory effort normal.  BS equal and clear bilateral without rales, rhonci, wheezing or stridor. Cardio: Heart sounds are normal with regular rate and rhythm and no murmurs, rubs or gallops. Peripheral pulses are normal and equal bilaterally without edema. No aortic or femoral  bruits. Chest: symmetric with normal excursions and percussion.  Abdomen: Soft, with Nl bowel sounds. Nontender, no guarding, rebound, hernias, masses, or organomegaly.  Lymphatics: Non tender without lymphadenopathy.  Genitourinary: No hernias.Testes nl. DRE - prostate nl for age - smooth & firm w/o nodules. Musculoskeletal: Full ROM all peripheral extremities, joint stability, 5/5 strength, and normal gait. Skin: Warm and dry without rashes, lesions, cyanosis, clubbing or  ecchymosis.  Neuro: Cranial nerves intact, reflexes equal bilaterally. Normal muscle tone, no cerebellar symptoms. Sensation intact.  Pysch: Alert and oriented X 3 with normal affect, insight and judgment appropriate.   Assessment and Plan  1. Essential hypertension, benign  - Microalbumin / creatinine urine ratio - EKG 12-Lead - [name], RETROPERITNL ABD,  LTD - TSH  2. Mixed hyperlipidemia  - EKG 12-Lead - [name], RETROPERITNL ABD,  LTD - Lipid panel - TSH  3. Prediabetes  - EKG 12-Lead - [name], RETROPERITNL ABD,  LTD - Hemoglobin A1c - Insulin, random  4. Vitamin D deficiency  - VITAMIN D 25 Hydroxy   5. Other abnormal glucose  - Hemoglobin A1c - Insulin, random  6. ASCAD screening  - EKG 12-Lead  7. Screening for rectal cancer  - POC Hemoccult Bld/Stl  8. Prostate cancer screening  - PSA  9. Medication management  - Urinalysis, Routine w reflex microscopic  - CBC with Differential/Platelet - BASIC METABOLIC PANEL WITH GFR - Hepatic function panel - Magnesium  10. Screening for ischemic heart disease  - EKG 12-Lead  11. Screening for AAA (aortic abdominal aneurysm)  - [name], RETROPERITNL ABD,  LTD  12. Bladder neck obstruction  - PSA      Continue prudent diet as discussed, weight control, BP monitoring, regular exercise, and medications as discussed.  Discussed med effects and SE's. Routine screening labs and tests as requested with regular follow-up as recommended. Over 40 minutes of  exam, counseling, chart review and high complex critical decision making was performed

## 2016-04-23 LAB — MICROALBUMIN / CREATININE URINE RATIO
Creatinine, Urine: 194 mg/dL (ref 20–370)
MICROALB UR: 0.6 mg/dL
MICROALB/CREAT RATIO: 3 ug/mg{creat} (ref <30)

## 2016-04-23 LAB — URINALYSIS, ROUTINE W REFLEX MICROSCOPIC
BILIRUBIN URINE: NEGATIVE
GLUCOSE, UA: NEGATIVE
Hgb urine dipstick: NEGATIVE
Ketones, ur: NEGATIVE
Leukocytes, UA: NEGATIVE
Nitrite: NEGATIVE
PROTEIN: NEGATIVE
Specific Gravity, Urine: 1.021 (ref 1.001–1.035)
pH: 5 (ref 5.0–8.0)

## 2016-04-23 LAB — HEMOGLOBIN A1C
Hgb A1c MFr Bld: 5.3 % (ref <5.7)
MEAN PLASMA GLUCOSE: 105 mg/dL

## 2016-04-23 LAB — LIPID PANEL
CHOLESTEROL: 151 mg/dL (ref <200)
HDL: 31 mg/dL — ABNORMAL LOW (ref >40)
LDL CALC: 92 mg/dL (ref <100)
Total CHOL/HDL Ratio: 4.9 Ratio (ref <5.0)
Triglycerides: 142 mg/dL (ref <150)
VLDL: 28 mg/dL (ref <30)

## 2016-04-23 LAB — BASIC METABOLIC PANEL WITH GFR
BUN: 23 mg/dL (ref 7–25)
CHLORIDE: 107 mmol/L (ref 98–110)
CO2: 23 mmol/L (ref 20–31)
CREATININE: 1.55 mg/dL — AB (ref 0.70–1.11)
Calcium: 9.4 mg/dL (ref 8.6–10.3)
GFR, Est African American: 46 mL/min — ABNORMAL LOW (ref >=60)
GFR, Est Non African American: 40 mL/min — ABNORMAL LOW (ref >=60)
GLUCOSE: 90 mg/dL (ref 65–99)
Potassium: 3.8 mmol/L (ref 3.5–5.3)
Sodium: 144 mmol/L (ref 135–146)

## 2016-04-23 LAB — HEPATIC FUNCTION PANEL
ALT: 12 U/L (ref 9–46)
AST: 15 U/L (ref 10–35)
Albumin: 4.3 g/dL (ref 3.6–5.1)
Alkaline Phosphatase: 59 U/L (ref 40–115)
BILIRUBIN DIRECT: 0.1 mg/dL (ref <=0.2)
BILIRUBIN INDIRECT: 0.5 mg/dL (ref 0.2–1.2)
Total Bilirubin: 0.6 mg/dL (ref 0.2–1.2)
Total Protein: 6.6 g/dL (ref 6.1–8.1)

## 2016-04-23 LAB — VITAMIN D 25 HYDROXY (VIT D DEFICIENCY, FRACTURES): VIT D 25 HYDROXY: 44 ng/mL (ref 30–100)

## 2016-04-23 LAB — MAGNESIUM: Magnesium: 2 mg/dL (ref 1.5–2.5)

## 2016-04-23 LAB — PSA: PSA: 1 ng/mL (ref <=4.0)

## 2016-04-23 LAB — INSULIN, RANDOM: INSULIN: 94.5 u[IU]/mL — AB (ref 2.0–19.6)

## 2016-07-15 DIAGNOSIS — N320 Bladder-neck obstruction: Secondary | ICD-10-CM | POA: Diagnosis not present

## 2016-07-15 DIAGNOSIS — N529 Male erectile dysfunction, unspecified: Secondary | ICD-10-CM | POA: Diagnosis not present

## 2016-07-15 DIAGNOSIS — N393 Stress incontinence (female) (male): Secondary | ICD-10-CM | POA: Diagnosis not present

## 2016-07-15 DIAGNOSIS — C61 Malignant neoplasm of prostate: Secondary | ICD-10-CM | POA: Diagnosis not present

## 2016-08-04 ENCOUNTER — Ambulatory Visit (INDEPENDENT_AMBULATORY_CARE_PROVIDER_SITE_OTHER): Payer: Medicare Other | Admitting: Internal Medicine

## 2016-08-04 ENCOUNTER — Encounter: Payer: Self-pay | Admitting: Internal Medicine

## 2016-08-04 VITALS — BP 124/60 | HR 89 | Temp 97.7°F | Resp 14 | Ht 70.0 in | Wt 179.8 lb

## 2016-08-04 DIAGNOSIS — E559 Vitamin D deficiency, unspecified: Secondary | ICD-10-CM

## 2016-08-04 DIAGNOSIS — E782 Mixed hyperlipidemia: Secondary | ICD-10-CM | POA: Diagnosis not present

## 2016-08-04 DIAGNOSIS — G3184 Mild cognitive impairment, so stated: Secondary | ICD-10-CM

## 2016-08-04 DIAGNOSIS — Z79899 Other long term (current) drug therapy: Secondary | ICD-10-CM

## 2016-08-04 DIAGNOSIS — G609 Hereditary and idiopathic neuropathy, unspecified: Secondary | ICD-10-CM

## 2016-08-04 DIAGNOSIS — R7303 Prediabetes: Secondary | ICD-10-CM

## 2016-08-04 DIAGNOSIS — I10 Essential (primary) hypertension: Secondary | ICD-10-CM

## 2016-08-04 NOTE — Progress Notes (Signed)
Assessment and Plan:  Hypertension:  -Continue medication,  -monitor blood pressure at home.  -Continue DASH diet.   -Reminder to go to the ER if any CP, SOB, nausea, dizziness, severe HA, changes vision/speech, left arm numbness and tingling, and jaw pain.  Cholesterol: -Continue diet and exercise.    Pre-diabetes: -Continue diet and exercise.   Vitamin D Def: -continue medications.   Mild Cognitive impairment -still able to handle finances and also to manage medications -does not want to start medications for this  Peripheral neuropathy -gait mildly unsteady -sees neurology in 3 months  -recommended using walker first thing in morning and at nighttime if needs to use bathroom  Continue diet and meds as discussed. Further disposition pending results of labs.  HPI 83 y.o. male  presents for 3 month follow up with hypertension, hyperlipidemia, prediabetes and vitamin D.   His blood pressure has been controlled at home, today their BP is BP: 124/60.   He does not workout. He denies chest pain, shortness of breath, dizziness.   He is on cholesterol medication and denies myalgias. His cholesterol is at goal. The cholesterol last visit was:   Lab Results  Component Value Date   CHOL 151 04/22/2016   HDL 31 (L) 04/22/2016   LDLCALC 92 04/22/2016   TRIG 142 04/22/2016   CHOLHDL 4.9 04/22/2016     He has been working on diet and exercise for prediabetes, and denies foot ulcerations, hyperglycemia, hypoglycemia , increased appetite, nausea, paresthesia of the feet, polydipsia, polyuria, visual disturbances, vomiting and weight loss. Last A1C in the office was:  Lab Results  Component Value Date   HGBA1C 5.3 04/22/2016    Patient is on Vitamin D supplement.  Lab Results  Component Value Date   VD25OH 44 04/22/2016      Patient reports that he has been having some worsening unsteadiness of his right knee which has been replaced.  He reports that he has not seen the  orthopedists, but does report that he has appointment with ortho in June.  He feels like the right leg is weaker.  He reports that this happened after he had the surgery.  He has had pain since the fall.  He does have a cane he uses.  He has an appointment with a neurologist soon for his neuropathy.  He is going to see her next month. He is going to talk to them about balance.     Current Medications:  Current Outpatient Prescriptions on File Prior to Visit  Medication Sig Dispense Refill  . alendronate (FOSAMAX) 70 MG tablet Take 1 tablet (70 mg total) by mouth every Saturday. Take with a full glass of water on an empty stomach. 12 tablet 3  . aspirin EC 325 MG EC tablet Take 1 tablet (325 mg total) by mouth 2 (two) times daily after a meal. 30 tablet 0  . Calcium Carbonate-Vitamin D (CALTRATE 600+D PO) Take 1 tablet by mouth daily.     . Cholecalciferol (VITAMIN D) 2000 UNITS tablet Take 8,000 Units by mouth daily.    . diphenoxylate-atropine (LOMOTIL) 2.5-0.025 MG tablet TAKE ONE TABLET BY MOUTH 4 TIMES DAILY AS NEEDED FOR DIARRHEA OR  LOOSE  STOOLS 90 tablet 1  . gabapentin (NEURONTIN) 400 MG capsule Take 400 mg by mouth 3 (three) times daily.    . hyoscyamine (LEVSIN/SL) 0.125 MG SL tablet Place 1 tablet (0.125 mg total) under the tongue every 4 (four) hours as needed. 30 tablet 0  .  propranolol (INDERAL) 20 MG tablet Take 1 tablet (20 mg total) by mouth 2 (two) times daily. 180 tablet 3  . sertraline (ZOLOFT) 100 MG tablet Take 1 tablet (100 mg total) by mouth daily. 90 tablet 3  . simvastatin (ZOCOR) 20 MG tablet Take 1 tablet (20 mg total) by mouth every evening. 90 tablet 3   No current facility-administered medications on file prior to visit.     Medical History:  Past Medical History:  Diagnosis Date  . Anemia   . Arthritis    arthritis,osteopenia,"spinal stenosis"  . CAD (coronary artery disease)   . Cancer [facility]) 12-06-12   Prostate cancer'98  . Depression   . GERD  (gastroesophageal reflux disease)   . H/O hiatal hernia   . H/O vertigo 12-06-12   none recent  . Hyperlipidemia   . IBS (irritable bowel syndrome)   . Neuropathy ([place])   . Osteopenia   . Peripheral neuropathy ([place]) 12-06-12   peripheral neuropathy  . Rhinitis   . RLS (restless legs syndrome)   . Vitamin B12 deficiency     Allergies:  Allergies  Allergen Reactions  . Niacin Itching  . Penicillins Other (See Comments)    "rash"  . Tramadol Itching     Review of Systems:  Review of Systems  Constitutional: Negative for chills, fever and malaise/fatigue.  HENT: Negative for congestion, ear pain and sore throat.   Eyes: Negative.   Respiratory: Negative for cough, shortness of breath and wheezing.   Cardiovascular: Negative for chest pain, palpitations and leg swelling.  Gastrointestinal: Negative for abdominal pain, blood in stool, constipation, diarrhea, heartburn and melena.  Genitourinary: Negative.   Musculoskeletal: Negative for falls, joint pain and myalgias.  Skin: Negative.   Neurological: Positive for sensory change. Negative for dizziness, loss of consciousness and headaches.  Psychiatric/Behavioral: Negative for depression. The patient is not nervous/anxious and does not have insomnia.     Family history- Review and unchanged  Social history- Review and unchanged  Physical Exam: BP 124/60   Pulse 89   Temp 97.7 F (36.5 C)   Resp 14   Ht 5\' 10"  (1.778 m)   Wt 179 lb 12.8 oz (81.6 kg)   SpO2 97%   BMI 25.80 kg/m  Wt Readings from Last 3 Encounters:  08/04/16 179 lb 12.8 oz (81.6 kg)  04/22/16 181 lb (82.1 kg)  01/20/16 180 lb (81.6 kg)    General Appearance: Well nourished well developed, in no apparent distress. Eyes: PERRLA, EOMs, conjunctiva no swelling or erythema ENT/Mouth: Ear canals normal without obstruction, swelling, erythma, discharge.  TMs normal bilaterally.  Oropharynx moist, clear, without exudate, or postoropharyngeal swelling. Neck:  Supple, thyroid normal,no cervical adenopathy  Respiratory: Respiratory effort normal, Breath sounds clear A&P without rhonchi, wheeze, or rale.  No retractions, no accessory usage. Cardio: RRR with no MRGs. Brisk peripheral pulses without edema.  Abdomen: Soft, + BS,  Non tender, no guarding, rebound, hernias, masses. Musculoskeletal: Full ROM, 5/5 strength, Gait with some slalloming Skin: Warm, dry without rashes, lesions, ecchymosis.  Neuro: Awake and oriented X 3, Cranial nerves intact. Normal muscle tone, no cerebellar symptoms. Psych: Normal affect, Insight and Judgment appropriate.    Starlyn Skeans, PA-C 2:54 PM [facility]

## 2016-08-19 ENCOUNTER — Ambulatory Visit (INDEPENDENT_AMBULATORY_CARE_PROVIDER_SITE_OTHER): Payer: Medicare Other | Admitting: Neurology

## 2016-08-19 ENCOUNTER — Encounter: Payer: Self-pay | Admitting: Neurology

## 2016-08-19 VITALS — BP 110/64 | HR 64 | Ht 70.0 in | Wt 178.4 lb

## 2016-08-19 DIAGNOSIS — G3184 Mild cognitive impairment, so stated: Secondary | ICD-10-CM | POA: Diagnosis not present

## 2016-08-19 NOTE — Patient Instructions (Addendum)
You look great!    1.  Continue gabapentin 400mg  three times daily 2.  Continue propranolol 20mg  twice daily  Return to clinic in 1 year

## 2016-08-19 NOTE — Progress Notes (Signed)
Follow-up Visit   Date: 08/19/16  Ryan Humphrey MRN: [id number] DOB: September 07, 1928   Interim History: Ryan Humphrey is a 83 y.o. right-handed Caucasian male with hyperlipidemia, arthritis, GERD, depression, prostate cancer, vitamin B12 deficiency, lumbar spine stenosis, and coronary artery disease returning to the clinic for follow-up of gait imbalance, new memory changes, and tremors.    History of present illness: The patient had been seeing Dr. Erling Cruz since 2013 for right-sided nonradiating neck pain. He had previous injections at [facility] pain clinic and by Dr. Mina Marble with no benefit. Most improvement was seen with diclofenac 75 mg twice daily, however due to side effects it was reduced to 50 mg twice daily. His neck pain slowly improved. There was also note of low back pain with radiation to the left lower extremity with associated numbness of the feet. At some point she was diagnosed with impaired fasting glucose. He was started gabapentin 300mg  TID and completed PT. He no longer has any pain, but continues to have problems with balance. His care was then transitioned to Dr. Lonni Fix and patient was last seen in May 2015 for bilateral leg cramps and burning paresthesias. He was taking gabapentin 300 mg 3 times daily. At that time, MRI studies as well as additional testing was discussed, however patient declined.  He has noticed increased problems with balance and finds himself using the furniture for assistance, especially at night time. He also feels as if his legs are weak. He has mild tingling of the toes at night. He has not had any falls, but does endorse stumbling at times. He walks independently. No similar symptoms of the hands.  UPDATE 04/29/2014:  He completed therapy and report no significant change, but was taught how to use the cane better.  He had on night of severe burning pain, but otherwise symptoms are relatively stable.  Denies any falls.  He reports to having  short-term memory problems and has to try harder to recall things, especially peoples names.  He also reports to have illegible hand writing and a tremor when he is trying to do fine motor tasks.  He is still living alone and taking care of his finances as well as driving without any difficulty.  UPDATE 07/29/2014:  He feels that his balance is stable without any worsening. His tremors have improved since start propranolol 20mg  daily. He has noticed vivid dreams since start Aricept, but they are not bothersome to stop it.  He continues to use his cane and has not had any falls. No new problems with driving or managing finances. He does not cook and eats most of his meals out.  He lives alone and his daughter visits daily.    UPDATE 08/20/2015:  He is here for one-year appointment.  He has no new complaints.  He is still living independently and manages all his day-to-day activities.  He does not cook very much and eats most of his meals out. He drives locally to familiar places and has not been involved in any MVAs.    He enjoys playing the piano for 3 hours per day and going to concerts.  He spends his time with his daughter, visiting his brother at memory unit, goes to church, and walks. Mood is good.   He developed vivid nightmares with Aricept, so stopped it.  He has one fall in the bathroom in 2016 and was eventually able to get up by himself.    UPDATE 08/19/2016:  Here is here  for one year appointment.  He has noticed that he is more forgetful with short-term memory, but nothing that has been a drastic change.  He still keeps up with all his daily activities.  He drives locally, during daytime hours only.  His daughter has not mentioned any issues with driving safety. He has a housekeeper that comes once per month.  He manages his medications and finances.  His appetite is poor and often eats only when he is hungry.   Mood is good. No home safety concerns.  He continues to play his piano 2-3 hours/d, keeps  up with current affairs, and engages socially.  He complaints of right knee pain and rhinitis for the past several months.   Medications:  Current Outpatient Prescriptions on File Prior to Visit  Medication Sig Dispense Refill  . alendronate (FOSAMAX) 70 MG tablet Take 1 tablet (70 mg total) by mouth every Saturday. Take with a full glass of water on an empty stomach. 12 tablet 3  . aspirin EC 325 MG EC tablet Take 1 tablet (325 mg total) by mouth 2 (two) times daily after a meal. 30 tablet 0  . Calcium Carbonate-Vitamin D (CALTRATE 600+D PO) Take 1 tablet by mouth daily.     . Cholecalciferol (VITAMIN D) 2000 UNITS tablet Take 8,000 Units by mouth daily.    . diphenoxylate-atropine (LOMOTIL) 2.5-0.025 MG tablet TAKE ONE TABLET BY MOUTH 4 TIMES DAILY AS NEEDED FOR DIARRHEA OR  LOOSE  STOOLS 90 tablet 1  . gabapentin (NEURONTIN) 400 MG capsule Take 400 mg by mouth 3 (three) times daily.    . propranolol (INDERAL) 20 MG tablet Take 1 tablet (20 mg total) by mouth 2 (two) times daily. 180 tablet 3  . sertraline (ZOLOFT) 100 MG tablet Take 1 tablet (100 mg total) by mouth daily. 90 tablet 3  . simvastatin (ZOCOR) 20 MG tablet Take 1 tablet (20 mg total) by mouth every evening. 90 tablet 3   No current facility-administered medications on file prior to visit.     Allergies:  Allergies  Allergen Reactions  . Niacin Itching  . Penicillins Other (See Comments)    "rash"  . Tramadol Itching     Review of Systems:  CONSTITUTIONAL: No fevers, chills, night sweats, or weight loss.   EYES: No visual changes or eye pain ENT: No hearing changes.  No history of nose bleeds.   RESPIRATORY: No cough, wheezing and shortness of breath.   CARDIOVASCULAR: Negative for chest pain, and palpitations.   GI: Negative for abdominal discomfort, blood in stools or black stools.  No recent change in bowel habits.   GU:  No history of incontinence.   MUSCLOSKELETAL: +history of joint pain or swelling.  No  myalgias.   SKIN: Negative for lesions, rash, and itching.   ENDOCRINE: Negative for cold or heat intolerance, polydipsia or goiter.   PSYCH:  No depression or anxiety symptoms.   NEURO: As Above.   Vital Signs:  BP 110/64   Pulse 64   Ht 5\' 10"  (1.778 m)   Wt 178 lb 6 oz (80.9 kg)   SpO2 98%   BMI 25.59 kg/m   Neurological Exam: MENTAL STATUS including orientation to time, place, person, recent and remote memory, attention span and concentration, language, and fund of knowledge is relatively intact.  Speech is not dysarthric.  Montreal Cognitive Assessment  08/19/2016 08/20/2015 04/29/2014  Visuospatial/ Executive (0/5) 5 4 5   Naming (0/3) 2 2 2   Attention: Read list  of digits (0/2) 2 2 2   Attention: Read list of letters (0/1) 1 1 1   Attention: Serial 7 subtraction starting at 100 (0/3) 3 3 3   Language: Repeat phrase (0/2) 2 2 2   Language : Fluency (0/1) 1 1 1   Abstraction (0/2) 2 2 2   Delayed Recall (0/5) 0 0 3  Orientation (0/6) 6 6 6   Total 24 23 27   Adjusted Score (based on education) 24 - 27   CRANIAL NERVES:  Pupils equal round and reactive to light.  Normal conjugate, extra-ocular eye movements in all directions of gaze.  No ptosis.  Face is symmetric.   MOTOR:  Motor strength is 5/5 in all extremities. No postural tremor.  COORDINATION/GAIT:     Gait is assisted with cane and appears stable, unsteady with turning at times.  Data: Lab Results  Component Value Date   TSH 2.99 04/22/2016   Lab Results  Component Value Date   HGBA1C 5.3 04/22/2016   Lab Results  Component Value Date   QJJHERDE08 144 06/30/2015   04/13/07 MRI cervical spine - congenital fusion at C6-7 with residual uncovertebral disease on the left causing mild to moderate left foraminal stenosis, advanced spondylosis at C3-C4 through C5-C6 with mild foraminal narrowing at each of those levels worse on the right with a disc contacting and deforming the cord at each of those levels.   10/28/10 MRI  lumbar spine - mild to moderate spinal stenosis at L1-2, moderate to marked left-sided and moderate right-sided spinal stenosis at L2-3, moderate to marked spinal stenosis and bilateral lateral recess stenosis at L3-4, mild spinal stenosis greater on the right and mild left foraminal narrowing at L4-5, broad-based disc osteophyte ridge and left mild indentation of the thecal sac at L5-S1 with mild extension to the left but no compression of the L5 nerve root.  IMPRESSION/PLAN: 1.  Mild cognitive impairment, functionally stable and independent, but there has been a drop in his [place] from 2016 (27 > 24).  Clinically stable. Formal psychological testing declined at his visit.  Currently there are no home safety issues.  Cognitive screening test is stable from the past year.  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  He has self restricted driving to daytime and local distances. He is doing remarkably well staying physically, mentally, and socially active which I encouraged.  He has not tolerated cholinesterase inhibitor therapy in the past and does not want to try any alternative medications.  2.  Benign essential tremors, stable.  Continue propranolol 20mg  BID  3.  Multifactorial gait instability from idiopathic peripheral neuropathy and known cervical and lumbar stenosis with myelopathy.  Continue to use cane, gait training declined. He get adequate relief with his current regimen of gabapentin 400mg  three times daily  4.  Right knee pain, conservative therapies with NSAIDs recommended.  If no improvement, follow-up with PCP  Return to clinic in 1 year or sooner as needed   The duration of this appointment visit was 25 minutes of face-to-face time with the patient.  Greater than 50% of this time was spent in counseling, explanation of diagnosis, planning of further management, and coordination of care.   Thank you for allowing me to participate in patient's care.  If I can  answer any additional questions, I would be pleased to do so.    Sincerely,    Keenya Matera K. Posey Pronto, DO

## 2016-08-29 DIAGNOSIS — M25561 Pain in right knee: Secondary | ICD-10-CM | POA: Diagnosis not present

## 2016-08-29 DIAGNOSIS — Z96651 Presence of right artificial knee joint: Secondary | ICD-10-CM | POA: Diagnosis not present

## 2016-10-10 ENCOUNTER — Encounter: Payer: Self-pay | Admitting: Physician Assistant

## 2016-10-10 ENCOUNTER — Ambulatory Visit (INDEPENDENT_AMBULATORY_CARE_PROVIDER_SITE_OTHER): Payer: Medicare Other | Admitting: Physician Assistant

## 2016-10-10 VITALS — BP 120/82 | HR 71 | Temp 97.7°F | Resp 16 | Ht 70.0 in | Wt 173.6 lb

## 2016-10-10 DIAGNOSIS — R059 Cough, unspecified: Secondary | ICD-10-CM

## 2016-10-10 DIAGNOSIS — H9203 Otalgia, bilateral: Secondary | ICD-10-CM

## 2016-10-10 DIAGNOSIS — H6123 Impacted cerumen, bilateral: Secondary | ICD-10-CM

## 2016-10-10 DIAGNOSIS — R05 Cough: Secondary | ICD-10-CM

## 2016-10-10 MED ORDER — PREDNISONE 20 MG PO TABS: ORAL_TABLET | ORAL | 0 refills | Status: DC

## 2016-10-10 MED ORDER — LEVOFLOXACIN 500 MG PO TABS: 500.0000 mg | ORAL_TABLET | Freq: Every day | ORAL | 0 refills | Status: DC

## 2016-10-10 NOTE — Patient Instructions (Signed)
GO GET CHEST XRAY AT [facility]  Make sure you are on an allergy pill, see below for more details. Please take the prednisone as directed below, this is NOT an antibiotic so you do NOT have to finish it. You can take it for a few days and stop it if you are doing better.   Please take the prednisone to help decrease inflammation and therefore decrease symptoms. Take it it with food to avoid GI upset. It can cause increased energy but on the other hand it can make it hard to sleep at night so please take it AT [place], it takes 8-12 hours to start working so it will NOT affect your sleeping if you take it at night with your food!!  If you are diabetic it will increase your sugars so decrease carbs and monitor your sugars closely.       Community-Acquired Pneumonia, Adult Pneumonia is an infection of the lungs. There are different types of pneumonia. One type can develop while a person is in a hospital. A different type, called community-acquired pneumonia, develops in people who are not, or have not recently been, in the hospital or other health care facility. What are the causes? Pneumonia may be caused by bacteria, viruses, or funguses. Community-acquired pneumonia is often caused by Streptococcus pneumonia bacteria. These bacteria are often passed from one person to another by breathing in droplets from the cough or sneeze of an infected person. What increases the risk? The condition is more likely to develop in:  People who havechronic diseases, such as chronic obstructive pulmonary disease (COPD), asthma, congestive heart failure, cystic fibrosis, diabetes, or kidney disease.  People who haveearly-stage or late-stage HIV.  People who havesickle cell disease.  People who havehad their spleen removed (splenectomy).  People who havepoor Human resources officer.  People who havemedical conditions that increase the risk of breathing in (aspirating) secretions their own mouth  and nose.  People who havea weakened immune system (immunocompromised).  People who smoke.  People whotravel to areas where pneumonia-causing germs commonly exist.  People whoare around animal habitats or animals that have pneumonia-causing germs, including birds, bats, rabbits, cats, and farm animals. What are the signs or symptoms? Symptoms of this condition include:  Adry cough.  A wet (productive) cough.  Fever.  Sweating.  Chest pain, especially when breathing deeply or coughing.  Rapid breathing or difficulty breathing.  Shortness of breath.  Shaking chills.  Fatigue.  Muscle aches. How is this diagnosed? Your health care provider will take a medical history and perform a physical exam. You may also have other tests, including:  Imaging studies of your chest, including X-rays.  Tests to check your blood oxygen level and other blood gases.  Other tests on blood, mucus (sputum), fluid around your lungs (pleural fluid), and urine. If your pneumonia is severe, other tests may be done to identify the specific cause of your illness. How is this treated? The type of treatment that you receive depends on many factors, such as the cause of your pneumonia, the medicines you take, and other medical conditions that you have. For most adults, treatment and recovery from pneumonia may occur at home. In some cases, treatment must happen in a hospital. Treatment may include:  Antibiotic medicines, if the pneumonia was caused by bacteria.  Antiviral medicines, if the pneumonia was caused by a virus.  Medicines that are given by mouth or through an IV tube.  Oxygen.  Respiratory therapy. Although rare, treating  severe pneumonia may include:  Mechanical ventilation. This is done if you are not breathing well on your own and you cannot maintain a safe blood oxygen level.  Thoracentesis. This procedureremoves fluid around one lung or both lungs to help you breathe  better. Follow these instructions at home:  Take over-the-counter and prescription medicines only as told by your health care provider.  Only takecough medicine if you are losing sleep. Understand that cough medicine can prevent your body's natural ability to remove mucus from your lungs.  If you were prescribed an antibiotic medicine, take it as told by your health care provider. Do not stop taking the antibiotic even if you start to feel better.  Sleep in a semi-upright position at night. Try sleeping in a reclining chair, or place a few pillows under your head.  Do not use tobacco products, including cigarettes, chewing tobacco, and e-cigarettes. If you need help quitting, ask your health care provider.  Drink enough water to keep your urine clear or pale yellow. This will help to thin out mucus secretions in your lungs. How is this prevented? There are ways that you can decrease your risk of developing community-acquired pneumonia. Consider getting a pneumococcal vaccine if:  You are older than 81 years of age.  You are older than 81 years of age and are undergoing cancer treatment, have chronic lung disease, or have other medical conditions that affect your immune system. Ask your health care provider if this applies to you. There are different types and schedules of pneumococcal vaccines. Ask your health care provider which vaccination option is best for you. You may also prevent community-acquired pneumonia if you take these actions:  Get an influenza vaccine every year. Ask your health care provider which type of influenza vaccine is best for you.  Go to the dentist on a regular basis.  Wash your hands often. Use hand sanitizer if soap and water are not available. Contact a health care provider if:  You have a fever.  You are losing sleep because you cannot control your cough with cough medicine. Get help right away if:  You have worsening shortness of breath.  You have  increased chest pain.  Your sickness becomes worse, especially if you are an older adult or have a weakened immune system.  You cough up blood. This information is not intended to replace advice given to you by your health care provider. Make sure you discuss any questions you have with your health care provider. Document Released: 05/30/2005 Document Revised: 10/08/2015 Document Reviewed: 09/24/2014 Elsevier Interactive Patient Education  2017 [employer].

## 2016-10-10 NOTE — Progress Notes (Signed)
Subjective:    Patient ID: Ryan Humphrey, male    DOB: 1928-07-10, 83 y.o.   MRN: [id number]  HPI 83 y.o. WM with history of COPD, CAD presents with cough x 2 weeks. He has had cough, nasal congestion x 2 weeks, some mucus, some SOB with talking, no fever, chills.   Blood pressure 120/82, pulse 71, temperature 97.7 F (36.5 C), resp. rate 16, height 5\' 10"  (1.778 m), weight 173 lb 9.6 oz (78.7 kg), SpO2 94 %.  Medications Current Outpatient Prescriptions on File Prior to Visit  Medication Sig  . alendronate (FOSAMAX) 70 MG tablet Take 1 tablet (70 mg total) by mouth every Saturday. Take with a full glass of water on an empty stomach.  Marland Kitchen aspirin EC 325 MG EC tablet Take 1 tablet (325 mg total) by mouth 2 (two) times daily after a meal.  . Calcium Carbonate-Vitamin D (CALTRATE 600+D PO) Take 1 tablet by mouth daily.   . Cholecalciferol (VITAMIN D) 2000 UNITS tablet Take 8,000 Units by mouth daily.  . diphenoxylate-atropine (LOMOTIL) 2.5-0.025 MG tablet TAKE ONE TABLET BY MOUTH 4 TIMES DAILY AS NEEDED FOR DIARRHEA OR  LOOSE  STOOLS  . gabapentin (NEURONTIN) 400 MG capsule Take 400 mg by mouth 3 (three) times daily.  . hyoscyamine (LEVSIN SL) 0.125 MG SL tablet Place under the tongue.  . propranolol (INDERAL) 20 MG tablet Take 1 tablet (20 mg total) by mouth 2 (two) times daily.  . sertraline (ZOLOFT) 100 MG tablet Take 1 tablet (100 mg total) by mouth daily.  . simvastatin (ZOCOR) 20 MG tablet Take 1 tablet (20 mg total) by mouth every evening.   No current facility-administered medications on file prior to visit.     Problem list He has Mixed hyperlipidemia; Essential hypertension, benign; CORONARY ATHEROSCLEROSIS NATIVE CORONARY ARTERY; INTERMITTENT VERTIGO; Primary osteoarthritis of right knee; Vitamin D deficiency; Prediabetes; Medication management; MCI (mild cognitive impairment); Hereditary and idiopathic peripheral neuropathy; Benign essential tremor; Bladder neck obstruction;  and Malignant neoplasm of prostate ([place]) on his problem list.   Review of Systems  Constitutional: Negative for chills, diaphoresis and fever.  HENT: Positive for congestion, ear pain, hearing loss, sinus pressure, sneezing and sore throat. Negative for trouble swallowing and voice change.   Eyes: Negative.   Respiratory: Positive for cough. Negative for chest tightness, shortness of breath and wheezing.   Cardiovascular: Negative.   Gastrointestinal: Negative.   Genitourinary: Negative.   Musculoskeletal: Negative for neck pain.  Neurological: Negative.  Negative for headaches.       Objective:   Physical Exam  Constitutional: He is oriented to person, place, and time. He appears well-developed and well-nourished.  HENT:  Head: Normocephalic and atraumatic.  Right Ear: External ear normal.  Left Ear: External ear normal.  Nose: Nose normal.  Mouth/Throat: Oropharynx is clear and moist.  after manual extraction in the office of bilateral cerumen impaction ears are normal  Eyes: Conjunctivae are normal. Pupils are equal, round, and reactive to light.  Neck: Normal range of motion. Neck supple.  Cardiovascular: Normal rate, regular rhythm and normal heart sounds.   No murmur heard. Pulmonary/Chest: Effort normal. No respiratory distress. He has wheezes. He has rales (left lower lobe). He exhibits no tenderness.  Abdominal: Soft. Bowel sounds are normal.  Lymphadenopathy:    He has no cervical adenopathy.  Neurological: He is alert and oriented to person, place, and time.  Skin: Skin is warm and dry.      Assessment &  Plan:  Cough The patient was advised to call immediately if he has any concerning symptoms in the interval. The patient voices understanding of current treatment options and is in agreement with the current care plan.The patient knows to call the clinic with any problems, questions or concerns or go to the ER if any further progression of symptoms.  - predniSONE  (DELTASONE) 20 MG tablet; 2 tablets daily for 3 days, 1 tablet daily for 4 days.  Dispense: 10 tablet; Refill: 0 - levofloxacin (LEVAQUIN) 500 MG tablet; Take 1 tablet (500 mg total) by mouth daily.  Dispense: 10 tablet; Refill: 0 - DG Chest 2 View; Future  Excessive cerumen in ear canal, bilateral Cerumen impaction - stop using Qtips, irrigation used in the office without complications, use OTC drops/oil at home to prevent reoccurence Code for ear pain as well

## 2016-10-11 ENCOUNTER — Ambulatory Visit (HOSPITAL_COMMUNITY)
Admission: RE | Admit: 2016-10-11 | Discharge: 2016-10-11 | Disposition: A | Discharge status: Home / Self Care | Payer: Medicare Other | Source: Ambulatory Visit | Attending: Physician Assistant | Admitting: Physician Assistant

## 2016-10-11 DIAGNOSIS — R05 Cough: Secondary | ICD-10-CM | POA: Diagnosis not present

## 2016-10-11 DIAGNOSIS — R059 Cough, unspecified: Secondary | ICD-10-CM

## 2016-10-11 DIAGNOSIS — R918 Other nonspecific abnormal finding of lung field: Secondary | ICD-10-CM | POA: Diagnosis not present

## 2016-10-11 NOTE — Progress Notes (Signed)
Pt aware of lab results & voiced understanding of those results.

## 2016-10-14 ENCOUNTER — Other Ambulatory Visit: Payer: Self-pay | Admitting: Internal Medicine

## 2016-11-04 ENCOUNTER — Encounter (HOSPITAL_COMMUNITY): Payer: Self-pay | Admitting: Emergency Medicine

## 2016-11-04 ENCOUNTER — Emergency Department (HOSPITAL_COMMUNITY): Payer: Medicare Other

## 2016-11-04 ENCOUNTER — Observation Stay (HOSPITAL_COMMUNITY)
Admission: EM | Admit: 2016-11-04 | Discharge: 2016-11-06 | Disposition: A | Discharge status: Home / Self Care | Payer: Medicare Other | Attending: Internal Medicine | Admitting: Internal Medicine

## 2016-11-04 DIAGNOSIS — K219 Gastro-esophageal reflux disease without esophagitis: Secondary | ICD-10-CM | POA: Insufficient documentation

## 2016-11-04 DIAGNOSIS — I6782 Cerebral ischemia: Secondary | ICD-10-CM | POA: Diagnosis not present

## 2016-11-04 DIAGNOSIS — G3189 Other specified degenerative diseases of nervous system: Secondary | ICD-10-CM | POA: Insufficient documentation

## 2016-11-04 DIAGNOSIS — Z7982 Long term (current) use of aspirin: Secondary | ICD-10-CM | POA: Insufficient documentation

## 2016-11-04 DIAGNOSIS — Z885 Allergy status to narcotic agent status: Secondary | ICD-10-CM | POA: Diagnosis not present

## 2016-11-04 DIAGNOSIS — M4802 Spinal stenosis, cervical region: Secondary | ICD-10-CM | POA: Diagnosis not present

## 2016-11-04 DIAGNOSIS — K589 Irritable bowel syndrome without diarrhea: Secondary | ICD-10-CM | POA: Diagnosis not present

## 2016-11-04 DIAGNOSIS — I10 Essential (primary) hypertension: Secondary | ICD-10-CM | POA: Diagnosis not present

## 2016-11-04 DIAGNOSIS — H538 Other visual disturbances: Secondary | ICD-10-CM | POA: Diagnosis not present

## 2016-11-04 DIAGNOSIS — I452 Bifascicular block: Secondary | ICD-10-CM | POA: Insufficient documentation

## 2016-11-04 DIAGNOSIS — Z880 Allergy status to penicillin: Secondary | ICD-10-CM | POA: Diagnosis not present

## 2016-11-04 DIAGNOSIS — Z888 Allergy status to other drugs, medicaments and biological substances status: Secondary | ICD-10-CM | POA: Diagnosis not present

## 2016-11-04 DIAGNOSIS — Z79899 Other long term (current) drug therapy: Secondary | ICD-10-CM | POA: Insufficient documentation

## 2016-11-04 DIAGNOSIS — M48061 Spinal stenosis, lumbar region without neurogenic claudication: Secondary | ICD-10-CM | POA: Diagnosis not present

## 2016-11-04 DIAGNOSIS — M1990 Unspecified osteoarthritis, unspecified site: Secondary | ICD-10-CM | POA: Insufficient documentation

## 2016-11-04 DIAGNOSIS — E782 Mixed hyperlipidemia: Secondary | ICD-10-CM | POA: Diagnosis not present

## 2016-11-04 DIAGNOSIS — R42 Dizziness and giddiness: Secondary | ICD-10-CM | POA: Diagnosis not present

## 2016-11-04 DIAGNOSIS — Z96651 Presence of right artificial knee joint: Secondary | ICD-10-CM | POA: Insufficient documentation

## 2016-11-04 DIAGNOSIS — E538 Deficiency of other specified B group vitamins: Secondary | ICD-10-CM | POA: Diagnosis not present

## 2016-11-04 DIAGNOSIS — F329 Major depressive disorder, single episode, unspecified: Secondary | ICD-10-CM | POA: Insufficient documentation

## 2016-11-04 DIAGNOSIS — G609 Hereditary and idiopathic neuropathy, unspecified: Secondary | ICD-10-CM | POA: Insufficient documentation

## 2016-11-04 DIAGNOSIS — Z818 Family history of other mental and behavioral disorders: Secondary | ICD-10-CM | POA: Insufficient documentation

## 2016-11-04 DIAGNOSIS — Z8673 Personal history of transient ischemic attack (TIA), and cerebral infarction without residual deficits: Secondary | ICD-10-CM | POA: Diagnosis present

## 2016-11-04 DIAGNOSIS — R7303 Prediabetes: Secondary | ICD-10-CM | POA: Diagnosis not present

## 2016-11-04 DIAGNOSIS — R292 Abnormal reflex: Principal | ICD-10-CM | POA: Insufficient documentation

## 2016-11-04 DIAGNOSIS — R404 Transient alteration of awareness: Secondary | ICD-10-CM | POA: Diagnosis not present

## 2016-11-04 DIAGNOSIS — Z87891 Personal history of nicotine dependence: Secondary | ICD-10-CM | POA: Insufficient documentation

## 2016-11-04 DIAGNOSIS — G2581 Restless legs syndrome: Secondary | ICD-10-CM | POA: Insufficient documentation

## 2016-11-04 DIAGNOSIS — M8580 Other specified disorders of bone density and structure, unspecified site: Secondary | ICD-10-CM | POA: Insufficient documentation

## 2016-11-04 DIAGNOSIS — G459 Transient cerebral ischemic attack, unspecified: Secondary | ICD-10-CM

## 2016-11-04 DIAGNOSIS — R270 Ataxia, unspecified: Secondary | ICD-10-CM | POA: Diagnosis not present

## 2016-11-04 DIAGNOSIS — I2510 Atherosclerotic heart disease of native coronary artery without angina pectoris: Secondary | ICD-10-CM | POA: Diagnosis not present

## 2016-11-04 DIAGNOSIS — Z833 Family history of diabetes mellitus: Secondary | ICD-10-CM | POA: Insufficient documentation

## 2016-11-04 DIAGNOSIS — Z8249 Family history of ischemic heart disease and other diseases of the circulatory system: Secondary | ICD-10-CM | POA: Insufficient documentation

## 2016-11-04 DIAGNOSIS — Z8546 Personal history of malignant neoplasm of prostate: Secondary | ICD-10-CM | POA: Insufficient documentation

## 2016-11-04 DIAGNOSIS — M5031 Other cervical disc degeneration,  high cervical region: Secondary | ICD-10-CM | POA: Insufficient documentation

## 2016-11-04 LAB — CBC WITH DIFFERENTIAL/PLATELET
BASOS ABS: 0 10*3/uL (ref 0.0–0.1)
Basophils Relative: 0 %
EOS PCT: 2 %
Eosinophils Absolute: 0.1 10*3/uL (ref 0.0–0.7)
HCT: 36.1 % — ABNORMAL LOW (ref 39.0–52.0)
HEMOGLOBIN: 12.5 g/dL — AB (ref 13.0–17.0)
LYMPHS PCT: 21 %
Lymphs Abs: 1.3 10*3/uL (ref 0.7–4.0)
MCH: 33 pg (ref 26.0–34.0)
MCHC: 34.6 g/dL (ref 30.0–36.0)
MCV: 95.3 fL (ref 78.0–100.0)
Monocytes Absolute: 0.3 10*3/uL (ref 0.1–1.0)
Monocytes Relative: 6 %
NEUTROS PCT: 72 %
Neutro Abs: 4.4 10*3/uL (ref 1.7–7.7)
PLATELETS: 113 10*3/uL — AB (ref 150–400)
RBC: 3.79 MIL/uL — AB (ref 4.22–5.81)
RDW: 12.7 % (ref 11.5–15.5)
WBC: 6.2 10*3/uL (ref 4.0–10.5)

## 2016-11-04 LAB — URINALYSIS, ROUTINE W REFLEX MICROSCOPIC
Bilirubin Urine: NEGATIVE
GLUCOSE, UA: NEGATIVE mg/dL
Hgb urine dipstick: NEGATIVE
Ketones, ur: NEGATIVE mg/dL
LEUKOCYTES UA: NEGATIVE
Nitrite: NEGATIVE
PROTEIN: NEGATIVE mg/dL
SPECIFIC GRAVITY, URINE: 1.004 — AB (ref 1.005–1.030)
pH: 6 (ref 5.0–8.0)

## 2016-11-04 LAB — BASIC METABOLIC PANEL
ANION GAP: 6 (ref 5–15)
BUN: 18 mg/dL (ref 6–20)
CHLORIDE: 108 mmol/L (ref 101–111)
CO2: 25 mmol/L (ref 22–32)
Calcium: 8.6 mg/dL — ABNORMAL LOW (ref 8.9–10.3)
Creatinine, Ser: 1.34 mg/dL — ABNORMAL HIGH (ref 0.61–1.24)
GFR, EST AFRICAN AMERICAN: 54 mL/min — AB (ref >60)
GFR, EST NON AFRICAN AMERICAN: 47 mL/min — AB (ref >60)
Glucose, Bld: 103 mg/dL — ABNORMAL HIGH (ref 65–99)
POTASSIUM: 4.4 mmol/L (ref 3.5–5.1)
SODIUM: 139 mmol/L (ref 135–145)
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
## 2016-11-04 NOTE — ED Notes (Signed)
To mri 
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
## 2016-11-04 NOTE — ED Notes (Addendum)
Neuro at bedside.
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
## 2016-11-04 NOTE — ED Notes (Signed)
The pt returned from mri  Up to br with assistance  His gait is a little unsteady   He wants food but his results are not ready

## 2016-11-05 ENCOUNTER — Observation Stay (HOSPITAL_COMMUNITY): Payer: Medicare Other

## 2016-11-05 ENCOUNTER — Encounter (HOSPITAL_COMMUNITY): Payer: Self-pay | Admitting: Internal Medicine

## 2016-11-05 ENCOUNTER — Observation Stay (HOSPITAL_COMMUNITY): Payer: BLUE CROSS/BLUE SHIELD

## 2016-11-05 DIAGNOSIS — G459 Transient cerebral ischemic attack, unspecified: Secondary | ICD-10-CM | POA: Diagnosis not present

## 2016-11-05 DIAGNOSIS — K219 Gastro-esophageal reflux disease without esophagitis: Secondary | ICD-10-CM

## 2016-11-05 DIAGNOSIS — E538 Deficiency of other specified B group vitamins: Secondary | ICD-10-CM | POA: Diagnosis present

## 2016-11-05 DIAGNOSIS — I10 Essential (primary) hypertension: Secondary | ICD-10-CM | POA: Diagnosis not present

## 2016-11-05 DIAGNOSIS — R292 Abnormal reflex: Secondary | ICD-10-CM | POA: Diagnosis not present

## 2016-11-05 DIAGNOSIS — M4802 Spinal stenosis, cervical region: Secondary | ICD-10-CM | POA: Diagnosis not present

## 2016-11-05 DIAGNOSIS — Z8673 Personal history of transient ischemic attack (TIA), and cerebral infarction without residual deficits: Secondary | ICD-10-CM | POA: Diagnosis present

## 2016-11-05 DIAGNOSIS — M4804 Spinal stenosis, thoracic region: Secondary | ICD-10-CM | POA: Diagnosis not present

## 2016-11-05 HISTORY — DX: Gastro-esophageal reflux disease without esophagitis: K21.9

## 2016-11-05 LAB — CBC
HCT: 38.3 % — ABNORMAL LOW (ref 39.0–52.0)
HEMOGLOBIN: 12.8 g/dL — AB (ref 13.0–17.0)
MCH: 31.8 pg (ref 26.0–34.0)
MCHC: 33.4 g/dL (ref 30.0–36.0)
MCV: 95.3 fL (ref 78.0–100.0)
Platelets: 123 10*3/uL — ABNORMAL LOW (ref 150–400)
RBC: 4.02 MIL/uL — AB (ref 4.22–5.81)
RDW: 12.5 % (ref 11.5–15.5)
WBC: 6.7 10*3/uL (ref 4.0–10.5)

## 2016-11-05 LAB — LIPID PANEL
CHOL/HDL RATIO: 3.4 ratio
Cholesterol: 123 mg/dL (ref 0–200)
HDL: 36 mg/dL — ABNORMAL LOW (ref >40)
LDL Cholesterol: 61 mg/dL (ref 0–99)
Triglycerides: 128 mg/dL (ref <150)
VLDL: 26 mg/dL (ref 0–40)

## 2016-11-05 LAB — GLUCOSE, CAPILLARY
GLUCOSE-CAPILLARY: 83 mg/dL (ref 65–99)
GLUCOSE-CAPILLARY: 100 mg/dL — AB (ref 65–99)
Glucose-Capillary: 98 mg/dL (ref 65–99)

## 2016-11-05 LAB — COMPREHENSIVE METABOLIC PANEL
ALK PHOS: 64 U/L (ref 38–126)
ALT: 11 U/L — AB (ref 17–63)
AST: 16 U/L (ref 15–41)
Albumin: 3.9 g/dL (ref 3.5–5.0)
Anion gap: 7 (ref 5–15)
BUN: 15 mg/dL (ref 6–20)
CO2: 29 mmol/L (ref 22–32)
CREATININE: 1.34 mg/dL — AB (ref 0.61–1.24)
Calcium: 8.9 mg/dL (ref 8.9–10.3)
Chloride: 106 mmol/L (ref 101–111)
GFR calc Af Amer: 54 mL/min — ABNORMAL LOW (ref >60)
GFR, EST NON AFRICAN AMERICAN: 47 mL/min — AB (ref >60)
Glucose, Bld: 96 mg/dL (ref 65–99)
Potassium: 4.4 mmol/L (ref 3.5–5.1)
Sodium: 142 mmol/L (ref 135–145)
Total Bilirubin: 1 mg/dL (ref 0.3–1.2)
Total Protein: 6.1 g/dL — ABNORMAL LOW (ref 6.5–8.1)

## 2016-11-05 LAB — VITAMIN B12: Vitamin B-12: 919 pg/mL — ABNORMAL HIGH (ref 180–914)

## 2016-11-05 MED ORDER — PANTOPRAZOLE SODIUM 40 MG PO TBEC
40.0000 mg | DELAYED_RELEASE_TABLET | Freq: Every day | ORAL | Status: DC
  Administered 2016-11-06: 40 mg via ORAL
  Filled 2016-11-05: qty 1

## 2016-11-05 MED ORDER — HYOSCYAMINE SULFATE 0.125 MG SL SUBL
0.1250 mg | SUBLINGUAL_TABLET | Freq: Four times a day (QID) | SUBLINGUAL | Status: DC | PRN
  Filled 2016-11-05: qty 1

## 2016-11-05 MED ORDER — GABAPENTIN 400 MG PO CAPS
400.0000 mg | ORAL_CAPSULE | Freq: Three times a day (TID) | ORAL | Status: DC
  Administered 2016-11-05 – 2016-11-06 (×3): 400 mg via ORAL
  Filled 2016-11-05 (×3): qty 1

## 2016-11-05 MED ORDER — LORAZEPAM 2 MG/ML IJ SOLN
INTRAMUSCULAR | Status: AC
  Filled 2016-11-05: qty 1

## 2016-11-05 MED ORDER — PROPRANOLOL HCL 20 MG PO TABS
20.0000 mg | ORAL_TABLET | Freq: Two times a day (BID) | ORAL | Status: DC
  Administered 2016-11-05 – 2016-11-06 (×2): 20 mg via ORAL
  Filled 2016-11-05 (×4): qty 1

## 2016-11-05 MED ORDER — DIPHENOXYLATE-ATROPINE 2.5-0.025 MG PO TABS: 1.0000 | ORAL_TABLET | Freq: Four times a day (QID) | ORAL | Status: DC | PRN

## 2016-11-05 MED ORDER — ASPIRIN EC 325 MG PO TBEC
325.0000 mg | DELAYED_RELEASE_TABLET | Freq: Every day | ORAL | Status: DC
  Administered 2016-11-05 (×2): 325 mg via ORAL
  Filled 2016-11-05 (×2): qty 1

## 2016-11-05 MED ORDER — VITAMIN D 1000 UNITS PO TABS
8000.0000 [IU] | ORAL_TABLET | Freq: Every day | ORAL | Status: DC
  Administered 2016-11-06: 8000 [IU] via ORAL
  Filled 2016-11-05: qty 8

## 2016-11-05 MED ORDER — SIMVASTATIN 20 MG PO TABS
20.0000 mg | ORAL_TABLET | Freq: Every evening | ORAL | Status: DC
  Administered 2016-11-05: 20 mg via ORAL
  Filled 2016-11-05: qty 1

## 2016-11-05 MED ORDER — ENOXAPARIN SODIUM 40 MG/0.4ML ~~LOC~~ SOLN
40.0000 mg | SUBCUTANEOUS | Status: DC
  Administered 2016-11-05 – 2016-11-06 (×2): 40 mg via SUBCUTANEOUS
  Filled 2016-11-05 (×2): qty 0.4

## 2016-11-05 MED ORDER — STROKE: EARLY STAGES OF RECOVERY BOOK
Freq: Once | Status: AC
  Administered 2016-11-05: 03:00:00

## 2016-11-05 MED ORDER — LORAZEPAM 2 MG/ML IJ SOLN
1.0000 mg | Freq: Once | INTRAMUSCULAR | Status: AC
  Administered 2016-11-05: 1 mg via INTRAVENOUS

## 2016-11-05 NOTE — Evaluation (Signed)
Occupational Therapy Evaluation Patient Details Name: Ryan Humphrey MRN: [id number] DOB: 11-27-1928 Today's Date: 11/05/2016    History of Present Illness Pt is an 83 y/o male admitted secondary to dizziness found to have a TIA. PMH including but not limited to CAD, HLD, neuropathy and R TKA in 2016.   Clinical Impression   PTA, pt was independent with basic ADL and functional mobility and utilized a cane for community mobility. Pt's niece lives with him but is unable to provide assistance. Pt's daughter present during session and reports that she and her sister and working out increasing supervision for pt post-acute D/C. Pt currently requires supervision overall for ADL with VC's for problem solving and memory. He presents with decreased dynamic standing balance, decreased problem solving skills, decreased short-term memory, and decreased judgement impacting safety with ADL. Pt would benefit from continued OT services while admitted to improve independence with ADL and functional mobility. Recommend 24 hour assistance at least initially post-acute D/C.     Follow Up Recommendations  Supervision/Assistance - 24 hour    Equipment Recommendations  None recommended by OT    Recommendations for Other Services       Precautions / Restrictions Precautions Precautions: Fall Restrictions Weight Bearing Restrictions: No      Mobility Bed Mobility Overal bed mobility: Needs Assistance Bed Mobility: Sit to Supine     Supine to sit: Min guard Sit to supine: Supervision   General bed mobility comments: Increased time and supervision for safety.   Transfers Overall transfer level: Needs assistance Equipment used: None Transfers: Sit to/from Stand Sit to Stand: Supervision         General transfer comment: Impulsivity noted with sit<>stand.     Balance Overall balance assessment: Needs assistance Sitting-balance support: Feet supported Sitting balance-Leahy Scale: Good      Standing balance support: During functional activity;No upper extremity supported Standing balance-Leahy Scale: Fair Standing balance comment: Able to statically stand without UE support for grooming tasks at sink.                           ADL either performed or assessed with clinical judgement   ADL Overall ADL's : Needs assistance/impaired                                       General ADL Comments: Overall supervision required for safety with VC's for processing. Pt unable to complete delayed recall tasks this session.      Vision Baseline Vision/History: Wears glasses Wears Glasses: At all times Patient Visual Report: No change from baseline Vision Assessment?: Yes Eye Alignment: Within Functional Limits Ocular Range of Motion: Within Functional Limits Alignment/Gaze Preference: Within Defined Limits Tracking/Visual Pursuits: Able to track stimulus in all quads without difficulty Saccades: Within functional limits Convergence: Within functional limits Visual Fields: No apparent deficits     Perception     Praxis Praxis Praxis tested?: Within functional limits    Pertinent Vitals/Pain Pain Assessment: No/denies pain     Hand Dominance Right   Extremity/Trunk Assessment Upper Extremity Assessment Upper Extremity Assessment: Overall WFL for tasks assessed   Lower Extremity Assessment Lower Extremity Assessment: Generalized weakness       Communication Communication Communication: No difficulties   Cognition Arousal/Alertness: Awake/alert Behavior During Therapy: WFL for tasks assessed/performed Overall Cognitive Status: Impaired/Different from baseline Area of Impairment: Memory;Safety/judgement;Problem solving  Memory: Decreased short-term memory   Safety/Judgement: Decreased awareness of safety   Problem Solving: Difficulty sequencing;Requires verbal cues General Comments: Pt requiring  instructional cues for short-term memory and talking himself through the events of the past few days to recall what happened. 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)   General Comments       Exercises     Shoulder Instructions      Home Living Family/patient expects to be discharged to:: Private residence Living Arrangements: Other relatives (niece) Available Help at Discharge: Family;Available PRN/intermittently Type of Home: Other(Comment) (townhouse) Home Access: Level entry     Home Layout: One level     Bathroom Shower/Tub: Tub/shower unit;Walk-in shower         Home Equipment: Shower seat - built in;Cane - single point;Walker - 2 wheels          Prior Functioning/Environment Level of Independence: Independent with assistive device(s)        Comments: pt uses a SPC if out in the community        OT Problem List: Decreased activity tolerance;Impaired balance (sitting and/or standing);Decreased cognition;Decreased safety awareness;Decreased knowledge of use of DME or AE;Decreased knowledge of precautions      OT Treatment/Interventions: Self-care/ADL training;Energy conservation;DME and/or AE instruction;Therapeutic activities;Cognitive remediation/compensation;Patient/family education;Balance training    OT Goals(Current goals can be found in the care plan section) Acute Rehab OT Goals Patient Stated Goal: return to PLOF OT Goal Formulation: With patient/family Time For Goal Achievement: 11/19/16 Potential to Achieve Goals: Good ADL Goals Pt Will Perform Grooming: with modified independence;standing Pt Will Transfer to Toilet: with modified independence;ambulating;regular height toilet;grab bars Pt Will Perform Toileting - Clothing Manipulation and hygiene: with modified independence;sit to/from  stand Additional ADL Goal #1: Pt will complete financial management task with no more than 1 VC for problem solving.  Additional ADL Goal #2: Pt will independently incorporate 3 strategies to compensate for decreased short-term memory into daily ADL routine.   OT Frequency: Min 2X/week   Barriers to D/C:            Co-evaluation              AM-PAC PT "6 Clicks" Daily Activity     Outcome Measure Help from another person eating meals?: None Help from another person taking care of personal grooming?: A Little Help from another person toileting, which includes using toliet, bedpan, or urinal?: A Little Help from another person bathing (including washing, rinsing, drying)?: A Little Help from another person to put on and taking off regular upper body clothing?: A Little Help from another person to put on and taking off regular lower body clothing?: A Little 6 Click Score: 19   End of Session Equipment Utilized During Treatment: Gait belt Nurse Communication: Mobility status  Activity Tolerance: Patient tolerated treatment well Patient left: in bed;with call bell/phone within reach;with bed alarm set  OT Visit Diagnosis: Unsteadiness on feet (R26.81);Other symptoms and signs involving cognitive function                Time: 8466-5993 OT Time Calculation (min): 17 min Charges:  OT General Charges $OT Visit: 1 Procedure OT Evaluation $OT Eval Moderate Complexity: 1 Procedure G-Codes: OT G-codes **NOT FOR INPATIENT CLASS** Functional Assessment Tool Used: Clinical judgement Functional Limitation: Self care Self Care Current Status (T7017): At least 20  percent but less than 40 percent impaired, limited or restricted Self Care Goal Status (O5929): 0 percent impaired, limited or restricted   Norman Herrlich, MS OTR/L  Pager: [place] 11/05/2016, 5:06 PM

## 2016-11-05 NOTE — Evaluation (Signed)
Physical Therapy Evaluation Patient Details Name: Ryan Humphrey MRN: [id number] DOB: 1928/12/24 Today's Date: 11/05/2016   History of Present Illness  Pt is an 83 y/o male admitted secondary to dizziness found to have a TIA. PMH including but not limited to CAD, HLD, neuropathy and R TKA in 2016.  Clinical Impression  Pt presented supine in bed with HOB elevated, awake and willing to participate in therapy session. Prior to admission, pt reported that he was mod I for functional mobility with use of SPC to ambulate and independent with ADLs. Pt's niece lives with him; however, pt stated that she is "disabled" and unable to assist him if he were to need it. Pt ambulated in hallway with RW and min guard for safety with mild instability but no overt LOB. PT will continue to follow acutely to ensure safe d/c home.     Follow Up Recommendations No PT follow up    Equipment Recommendations  None recommended by PT    Recommendations for Other Services       Precautions / Restrictions Precautions Precautions: Fall Restrictions Weight Bearing Restrictions: No      Mobility  Bed Mobility Overal bed mobility: Needs Assistance Bed Mobility: Supine to Sit     Supine to sit: Min guard     General bed mobility comments: increased time, min guard for safety  Transfers Overall transfer level: Needs assistance Equipment used: None Transfers: Sit to/from Stand Sit to Stand: Supervision         General transfer comment: slightly impulsive with transfer, supervision for safety  Ambulation/Gait Ambulation/Gait assistance: Min guard Ambulation Distance (Feet): 100 Feet Assistive device: Rolling walker (2 wheeled) Gait Pattern/deviations: Step-through pattern;Decreased stride length;Drifts right/left;Trunk flexed Gait velocity: decreased Gait velocity interpretation: <1.8 ft/sec, indicative of risk for recurrent falls General Gait Details: mild instability with use of RW but no LOB  or need for physical assistance, min guard for safety  Stairs            Wheelchair Mobility    Modified Rankin (Stroke Patients Only) Modified Rankin (Stroke Patients Only) Pre-Morbid Rankin Score: Slight disability Modified Rankin: Slight disability     Balance Overall balance assessment: Needs assistance Sitting-balance support: Feet supported Sitting balance-Leahy Scale: Fair     Standing balance support: During functional activity;No upper extremity supported Standing balance-Leahy Scale: Fair                               Pertinent Vitals/Pain Pain Assessment: No/denies pain    Home Living Family/patient expects to be discharged to:: Private residence Living Arrangements: Other relatives (niece) Available Help at Discharge: Family;Available PRN/intermittently Type of Home: Other(Comment) (townhouse) Home Access: Level entry     Home Layout: One level Home Equipment: Shower seat - built in;Cane - single point;Walker - 2 wheels      Prior Function Level of Independence: Independent with assistive device(s)         Comments: pt uses a SPC if out in the community     Hand Dominance   Dominant Hand: Right    Extremity/Trunk Assessment   Upper Extremity Assessment Upper Extremity Assessment: Defer to OT evaluation    Lower Extremity Assessment Lower Extremity Assessment: Generalized weakness       Communication   Communication: No difficulties  Cognition Arousal/Alertness: Awake/alert Behavior During Therapy: WFL for tasks assessed/performed Overall Cognitive Status: Impaired/Different from baseline Area of Impairment: Memory;Safety/judgement;Problem solving  Memory: Decreased short-term memory   Safety/Judgement: Decreased awareness of safety   Problem Solving: Difficulty sequencing;Requires verbal cues        General Comments      Exercises     Assessment/Plan    PT Assessment Patient  needs continued PT services  PT Problem List Decreased balance;Decreased mobility;Decreased coordination;Decreased knowledge of use of DME;Decreased safety awareness       PT Treatment Interventions DME instruction;Gait training;Stair training;Functional mobility training;Therapeutic activities;Therapeutic exercise;Balance training;Neuromuscular re-education;Patient/family education;Cognitive remediation    PT Goals (Current goals can be found in the Care Plan section)  Acute Rehab PT Goals Patient Stated Goal: return to PLOF PT Goal Formulation: With patient Time For Goal Achievement: 11/19/16 Potential to Achieve Goals: Good    Frequency Min 3X/week   Barriers to discharge        Co-evaluation               AM-PAC PT "6 Clicks" Daily Activity  Outcome Measure Difficulty turning over in bed (including adjusting bedclothes, sheets and blankets)?: A Little Difficulty moving from lying on back to sitting on the side of the bed? : A Little Difficulty sitting down on and standing up from a chair with arms (e.g., wheelchair, bedside commode, etc,.)?: A Little Help needed moving to and from a bed to chair (including a wheelchair)?: A Little Help needed walking in hospital room?: A Little Help needed climbing 3-5 steps with a railing? : A Lot 6 Click Score: 17    End of Session   Activity Tolerance: Patient tolerated treatment well Patient left: in bed;with family/visitor present;Other (comment) (OT in room ) Nurse Communication: Mobility status PT Visit Diagnosis: Unsteadiness on feet (R26.81);Other abnormalities of gait and mobility (R26.89)    Time: 4403-4742 PT Time Calculation (min) (ACUTE ONLY): 16 min   Charges:   PT Evaluation $PT Eval Moderate Complexity: 1 Procedure     PT G Codes:   PT G-Codes **NOT FOR INPATIENT CLASS** Functional Assessment Tool Used: AM-PAC 6 Clicks Basic Mobility;Clinical judgement Functional Limitation: Mobility: Walking and moving  around Mobility: Walking and Moving Around Current Status (V9563): At least 40 percent but less than 60 percent impaired, limited or restricted Mobility: Walking and Moving Around Goal Status [phone]): At least 1 percent but less than 20 percent impaired, limited or restricted    [facility], [place], DPT [place] 11/05/2016, 3:56 PM

## 2016-11-05 NOTE — Progress Notes (Signed)
PROGRESS NOTE                                                                                                                                                                                                             Patient Demographics:    Ryan Humphrey, is a 83 y.o. male, DOB - 05/10/1929, IRW:[id number]  Admit date - 11/04/2016   Admitting Physician Reubin Milan, MD  Outpatient Primary MD for the patient is Unk Pinto, MD  LOS - 0   Chief Complaint  Patient presents with  . Dizziness       Brief Narrative   Patient admitted earlier today by Dr. Olevia Bowens chart, imaging, labs were reviewed, admitted for TIA workup.   Subjective:    Ryan Humphrey today has, No headache, No chest pain, No abdominal pain - No Nausea, No new weakness tingling or numbness, No Cough - SOB.    Assessment  & Plan :    Principal Problem:   TIA (transient ischemic attack) Active Problems:   Mixed hyperlipidemia   Essential hypertension, benign   CORONARY ATHEROSCLEROSIS NATIVE CORONARY ARTERY   Prediabetes   Vitamin B12 deficiency   GERD (gastroesophageal reflux disease)   TIA  - MRI brain with no evidence of acute CVA, follow on 2-D echo, follow carotid Doppler, continue with full dose aspirin, LDL is 61. - MRI cervical/proximal thoracic spine pending to rule out subclinical cord compression per neuro recommendation given 4+ patellar reflexes with crossed adductor response.   Mixed hyperlipidemia Continue Zocor 20 mg by mouth daily.LDL is 61.   Essential hypertension, benign Continue propranolol 20 mg by mouth twice a day. Monitor blood pressure.  CORONARY ATHEROSCLEROSIS NATIVE CORONARY ARTERY Continue aspirin, propranolol and simvastatin.  Prediabetes Carbohydrate modified diet. Check hemoglobin A1c. Monitor CBG before meals.  Vitamin B12 deficiency Check vitamin B12 level. Continue oral supplementation.  GERD  (gastroesophageal reflux disease) Pantoprazole 40 mg by mouth daily.   Code Status : Full  Family Communication  : one at bedside  Disposition Plan  : Home when stable  Consults  :  neurology  Procedures  : none  DVT Prophylaxis  :  Lovenox  - SCDs   Lab Results  Component Value Date   PLT 123 (L) 11/05/2016    Antibiotics  :    Anti-infectives  None        Objective:   Vitals:   11/05/16 0152 11/05/16 0400 11/05/16 0600 11/05/16 0841  BP: 126/62 (!) 119/58 (!) 114/54 (!) 105/56  Pulse: 73 76 74 88  Resp: 16 18 18 18   Temp: 97.5 F (36.4 C)   98.6 F (37 C)  TempSrc: Oral     SpO2: 99% 98% 98% 98%  Weight: 76.2 kg (168 lb)     Height: 5\' 10"  (1.778 m)       Wt Readings from Last 3 Encounters:  11/05/16 76.2 kg (168 lb)  10/10/16 78.7 kg (173 lb 9.6 oz)  08/19/16 80.9 kg (178 lb 6 oz)    No intake or output data in the 24 hours ending 11/05/16 1220   Physical Exam  Awake Alert, Oriented X 3, No new F.N deficits, Normal affect.  Symmetrical Chest wall movement, Good air movement bilaterally, CTAB RRR,No Gallops,Rubs or new Murmurs, No Parasternal Heave +ve B.Sounds, Abd Soft, No tenderness, No rebound - guarding or rigidity. No Cyanosis, Clubbing or edema, No new Rash or bruise      Data Review:    CBC  Recent Labs Lab 11/04/16 1405 11/05/16 0409  WBC 6.2 6.7  HGB 12.5* 12.8*  HCT 36.1* 38.3*  PLT 113* 123*  MCV 95.3 95.3  MCH 33.0 31.8  MCHC 34.6 33.4  RDW 12.7 12.5  LYMPHSABS 1.3  --   MONOABS 0.3  --   EOSABS 0.1  --   BASOSABS 0.0  --     Chemistries   Recent Labs Lab 11/04/16 1405 11/05/16 0409  NA 139 142  K 4.4 4.4  CL 108 106  CO2 25 29  GLUCOSE 103* 96  BUN 18 15  CREATININE 1.34* 1.34*  CALCIUM 8.6* 8.9  AST  --  16  ALT  --  11*  ALKPHOS  --  64  BILITOT  --  1.0   ------------------------------------------------------------------------------------------------------------------  Recent Labs   11/05/16 0409  CHOL 123  HDL 36*  LDLCALC 61  TRIG 128  CHOLHDL 3.4    Lab Results  Component Value Date   HGBA1C 5.3 04/22/2016   ------------------------------------------------------------------------------------------------------------------ No results for input(s): TSH, T4TOTAL, T3FREE, THYROIDAB in the last 72 hours.  Invalid input(s): FREET3 ------------------------------------------------------------------------------------------------------------------  Recent Labs  11/05/16 0409  VITAMINB12 919*    Coagulation profile No results for input(s): INR, PROTIME in the last 168 hours.  No results for input(s): DDIMER in the last 72 hours.  Cardiac Enzymes No results for input(s): CKMB, TROPONINI, MYOGLOBIN in the last 168 hours.  Invalid input(s): CK ------------------------------------------------------------------------------------------------------------------ No results found for: BNP  Inpatient Medications  Scheduled Meds: . LORazepam      . aspirin  325 mg Oral QHS  . cholecalciferol  8,000 Units Oral Daily  . enoxaparin (LOVENOX) injection  40 mg Subcutaneous Q24H  . gabapentin  400 mg Oral TID  . pantoprazole  40 mg Oral Daily  . propranolol  20 mg Oral BID  . simvastatin  20 mg Oral QPM   Continuous Infusions: PRN Meds:.diphenoxylate-atropine, hyoscyamine  Micro Results No results found for this or any previous visit (from the past 240 hour(s)).  Radiology Reports Dg Chest 2 View  Result Date: 10/11/2016 CLINICAL DATA:  Cough, abnormal breath sounds in the left lower lobe. EXAM: CHEST  2 VIEW COMPARISON:  11/27/2014 FINDINGS: Mild hyperinflation. Heart and mediastinal contours are within normal limits. No focal opacities or effusions. No acute bony abnormality. IMPRESSION: Hyperinflation.  No active cardiopulmonary disease. Electronically Signed   By: Rolm Baptise M.D.   On: 10/11/2016 11:13   Mr Brain Wo Contrast (neuro Protocol)  Result Date:  11/04/2016 CLINICAL DATA:  Dizziness EXAM: MRI HEAD WITHOUT CONTRAST TECHNIQUE: Multiplanar, multiecho pulse sequences of the brain and surrounding structures were obtained without intravenous contrast. COMPARISON:  1. Head CT 01/03/2015 2. Brain MRI 02/02/2010 FINDINGS: Brain: The midline structures are normal. There is no focal diffusion restriction to indicate acute infarct. There is mild hyperintense T2-weighted signal within the periventricular white matter, most often seen in the setting of chronic microvascular ischemia. No intraparenchymal hematoma or chronic microhemorrhage. There is mild volume loss of the cerebrum and cerebellar atrophy. The dura is normal and there is no extra-axial collection. Vascular: Major intracranial arterial and venous sinus flow voids are preserved. Skull and upper cervical spine: Incompletely visualized upper cervical spine is at least mildly stenotic. Sinuses/Orbits: No fluid levels or advanced mucosal thickening. No mastoid effusion. Normal orbits. IMPRESSION: 1. No acute intracranial abnormality. 2. Mild atrophy and chronic microvascular ischemia. 3. At least mild upper cervical spinal canal stenosis, incompletely visualized on this examination. Electronically Signed   By: Ulyses Jarred M.D.   On: 11/04/2016 17:39     Donya Tomaro M.D on 11/05/2016 at 12:20 PM  Between 7am to 7pm - Pager - [phone]  After 7pm go to [email or web address] - password [facility]  [facility] -  Office  [phone]

## 2016-11-05 NOTE — H&P (Signed)
History and Physical    Ryan Humphrey OVF:[id number] DOB: 24-Feb-1929 DOA: 11/04/2016  PCP: Unk Pinto, MD   Patient coming from: [facility].  I have personally briefly reviewed patient's old medical records in [place]  Chief Complaint: Dizziness.  HPI: Ryan Humphrey is a 83 y.o. male with medical history significant of anemia, osteoarthritis, osteopenia, coronary artery disease, prostate cancer, depression, GERD, history of a hiatal hernia, history of vertigo, hyperlipidemia, IBS, neuropathy, restless leg syndrome, vitamin B12 deficiency who is coming to the emergency department via EMS due to dizziness while driving.  Per patient, he was driving his niece to see her father, when his vision "broke down. "Everything on his vision became very fractured. He also had dizziness. He stopped for a while and his patient got better. He decided to go back home, but stopped at the urgent care center on the way home, was given aspirin and subsequently transferred to the emergency department via EMS. He denies slurred speech, language comprehension impairment, focal extremity weakness or numbness. He mentions that his left face felt with a strain sensation of mild pain. He denies nausea, emesis, chest pain, dyspnea, palpitations, diaphoresis or abdominal pain. He denies diarrhea, constipation, melena or hematochezia. He denies dysuria, frequency or hematuria.  ED Course: He was seen by neurology. Urinalysis was unremarkable. WBC 6.2, hemoglobin 12.5 g/dL and platelets 113. Sodium 139, potassium 4.4, chloride 108 and bicarbonate 25 mmol/L. BUN was 18, creatinine 1.34 and glucose 104 mg/dL. EKG was sinus rhythm with LBBB.   Imaging: MRI of the brain did not show any acute intracranial abnormality. There was the mild atrophy, chronic microvascular ischemic changes and mild upper cervical spinal Stenosis.  Review of Systems: As per HPI otherwise 10 point review of systems negative.    Past Medical  History:  Diagnosis Date  . Anemia   . Arthritis    arthritis,osteopenia,"spinal stenosis"  . CAD (coronary artery disease)   . Cancer [facility]) 12-06-12   Prostate cancer'98  . Depression   . GERD (gastroesophageal reflux disease)   . H/O hiatal hernia   . H/O vertigo 12-06-12   none recent  . Hyperlipidemia   . IBS (irritable bowel syndrome)   . Neuropathy   . Osteopenia   . Peripheral neuropathy 12-06-12   peripheral neuropathy  . Rhinitis   . RLS (restless legs syndrome)   . Vitamin B12 deficiency     Past Surgical History:  Procedure Laterality Date  . APPENDECTOMY    . CARPAL TUNNEL RELEASE     both hands  . cataract surgery Left 12-06-12   recent surgery  . CHOLECYSTECTOMY    . COLONOSCOPY WITH PROPOFOL N/A 12/25/2012   Procedure: COLONOSCOPY WITH PROPOFOL;  Surgeon: Garlan Fair, MD;  Location: [facility];  Service: Endoscopy;  Laterality: N/A;  . ESOPHAGOGASTRODUODENOSCOPY (EGD) WITH PROPOFOL N/A 12/25/2012   Procedure: ESOPHAGOGASTRODUODENOSCOPY (EGD) WITH PROPOFOL;  Surgeon: Garlan Fair, MD;  Location: [facility] ENDOSCOPY;  Service: Endoscopy;  Laterality: N/A;  . PROSTATE SURGERY  12-06-12  . TONSILLECTOMY    . TOTAL KNEE ARTHROPLASTY Right 11/25/2014   Procedure: RIGHT TOTAL KNEE ARTHROPLASTY;  Surgeon: Melrose Nakayama, MD;  Location: [place];  Service: Orthopedics;  Laterality: Right;     reports that he quit smoking about 53 years ago. He has a 15.00 pack-year smoking history. He has never used smokeless tobacco. He reports that he does not drink alcohol or use drugs.  Allergies  Allergen Reactions  . Niacin Itching  .  Penicillins Other (See Comments)    "rash"  . Tramadol Itching    Family History  Problem Relation Age of Onset  . CAD Father        Deceased, 7  . Dementia Mother        Deceased, 75  . Diabetes Brother   . Hyperlipidemia Brother   . Hypertension Brother   . Dementia Brother   . Colon cancer Neg Hx   . Colon polyps Neg Hx   . Kidney  disease Neg Hx   . Esophageal cancer Neg Hx   . Gallbladder disease Neg Hx     Prior to Admission medications   Medication Sig Start Date End Date Taking? Authorizing Provider  alendronate (FOSAMAX) 70 MG tablet Take 1 tablet (70 mg total) by mouth every Saturday. Take with a full glass of water on an empty stomach. 10/14/15  Yes Unk Pinto, MD  aspirin EC 325 MG EC tablet Take 1 tablet (325 mg total) by mouth 2 (two) times daily after a meal. Patient taking differently: Take 325 mg by mouth at bedtime.  11/27/14  Yes Loni Dolly, PA-C  Calcium Carbonate-Vitamin D (CALTRATE 600+D PO) Take 1 tablet by mouth daily.    Yes [provider]  Cholecalciferol (VITAMIN D) 2000 UNITS tablet Take 8,000 Units by mouth daily.   Yes [provider]  diphenoxylate-atropine (LOMOTIL) 2.5-0.025 MG tablet TAKE ONE TABLET BY MOUTH 4 TIMES DAILY AS NEEDED FOR DIARRHEA OR  LOOSE  STOOLS 04/16/16  Yes Unk Pinto, MD  gabapentin (NEURONTIN) 400 MG capsule Take 400 mg by mouth 3 (three) times daily.   Yes [provider]  hyoscyamine (LEVSIN SL) 0.125 MG SL tablet Place 0.125 mg under the tongue every 6 (six) hours as needed for cramping.  01/20/16  Yes [provider]  propranolol (INDERAL) 20 MG tablet Take 1 tablet (20 mg total) by mouth 2 (two) times daily. 10/14/15  Yes Unk Pinto, MD  simvastatin (ZOCOR) 20 MG tablet Take 1 tablet (20 mg total) by mouth every evening. 10/14/15  Yes Unk Pinto, MD    Physical Exam: Vitals:   11/05/16 0030 11/05/16 0100 11/05/16 0130 11/05/16 0152  BP:    126/62  Pulse: 77 75 75 73  Resp: 14 16 13 16   Temp:    97.5 F (36.4 C)  TempSrc:    Oral  SpO2: 97% 98% 96% 99%  Weight:    76.2 kg (168 lb)  Height:    5\' 10"  (1.778 m)    Constitutional: NAD, calm, comfortable Eyes: PERRL, lids and conjunctivae normal ENMT: Mucous membranes are moist. Posterior pharynx clear of any exudate or lesions. Neck: normal, supple, no  masses, no thyromegaly Respiratory: clear to auscultation bilaterally, no wheezing, no crackles. Normal respiratory effort. No accessory muscle use.  Cardiovascular: Regular rate and rhythm, no murmurs / rubs / gallops. No extremity edema. 2+ pedal pulses. No carotid bruits.  Abdomen: Soft, no tenderness, no masses palpated. No hepatosplenomegaly. Bowel sounds positive.  Musculoskeletal: no clubbing / cyanosis. Good ROM, no contractures. Normal muscle tone.  Skin: no rashes, lesions, ulcers on limited skin exam. Neurologic: CN 2-12 grossly intact. Sensation intact, DTR normal, except 4+ patellar reflexes with cross abductor responses. Strength 5/5 in all 4.  Psychiatric: Normal judgment and insight. Alert and oriented x 4. Normal mood.    Labs on Admission: I have personally reviewed following labs and imaging studies  CBC:  Recent Labs Lab 11/04/16 1405  WBC 6.2  NEUTROABS 4.4  HGB 12.5*  HCT 36.1*  MCV 95.3  PLT 720*   Basic Metabolic Panel:  Recent Labs Lab 11/04/16 1405  NA 139  K 4.4  CL 108  CO2 25  GLUCOSE 103*  BUN 18  CREATININE 1.34*  CALCIUM 8.6*   GFR: Estimated Creatinine Clearance: 41.6 mL/min (A) (by C-G formula based on SCr of 1.34 mg/dL (H)). Liver Function Tests: No results for input(s): AST, ALT, ALKPHOS, BILITOT, PROT, ALBUMIN in the last 168 hours. No results for input(s): LIPASE, AMYLASE in the last 168 hours. No results for input(s): AMMONIA in the last 168 hours. Coagulation Profile: No results for input(s): INR, PROTIME in the last 168 hours. Cardiac Enzymes: No results for input(s): CKTOTAL, CKMB, CKMBINDEX, TROPONINI in the last 168 hours. BNP (last 3 results) No results for input(s): PROBNP in the last 8760 hours. HbA1C: No results for input(s): HGBA1C in the last 72 hours. CBG: No results for input(s): GLUCAP in the last 168 hours. Lipid Profile: No results for input(s): CHOL, HDL, LDLCALC, TRIG, CHOLHDL, LDLDIRECT in the last 72  hours. Thyroid Function Tests: No results for input(s): TSH, T4TOTAL, FREET4, T3FREE, THYROIDAB in the last 72 hours. Anemia Panel: No results for input(s): VITAMINB12, FOLATE, FERRITIN, TIBC, IRON, RETICCTPCT in the last 72 hours. Urine analysis:    Component Value Date/Time   COLORURINE COLORLESS (A) 11/04/2016 1550   APPEARANCEUR CLEAR 11/04/2016 1550   LABSPEC 1.004 (L) 11/04/2016 1550   PHURINE 6.0 11/04/2016 1550   GLUCOSEU NEGATIVE 11/04/2016 1550   HGBUR NEGATIVE 11/04/2016 1550   BILIRUBINUR NEGATIVE 11/04/2016 1550   KETONESUR NEGATIVE 11/04/2016 1550   PROTEINUR NEGATIVE 11/04/2016 1550   UROBILINOGEN 0.2 01/03/2015 1330   NITRITE NEGATIVE 11/04/2016 1550   LEUKOCYTESUR NEGATIVE 11/04/2016 1550    Radiological Exams on Admission: Mr Brain Wo Contrast (neuro Protocol)  Result Date: 11/04/2016 CLINICAL DATA:  Dizziness EXAM: MRI HEAD WITHOUT CONTRAST TECHNIQUE: Multiplanar, multiecho pulse sequences of the brain and surrounding structures were obtained without intravenous contrast. COMPARISON:  1. Head CT 01/03/2015 2. Brain MRI 02/02/2010 FINDINGS: Brain: The midline structures are normal. There is no focal diffusion restriction to indicate acute infarct. There is mild hyperintense T2-weighted signal within the periventricular white matter, most often seen in the setting of chronic microvascular ischemia. No intraparenchymal hematoma or chronic microhemorrhage. There is mild volume loss of the cerebrum and cerebellar atrophy. The dura is normal and there is no extra-axial collection. Vascular: Major intracranial arterial and venous sinus flow voids are preserved. Skull and upper cervical spine: Incompletely visualized upper cervical spine is at least mildly stenotic. Sinuses/Orbits: No fluid levels or advanced mucosal thickening. No mastoid effusion. Normal orbits. IMPRESSION: 1. No acute intracranial abnormality. 2. Mild atrophy and chronic microvascular ischemia. 3. At least mild  upper cervical spinal canal stenosis, incompletely visualized on this examination. Electronically Signed   By: Ulyses Jarred M.D.   On: 11/04/2016 17:39    EKG: Independently reviewed. Vent. rate 71 BPM PR interval * ms QRS duration 139 ms QT/QTc 426/463 ms P-R-T axes 49 3 27 Sinus rhythm Right bundle branch block  Assessment/Plan Principal Problem:   TIA (transient ischemic attack) Admit to neuro floor telemetry/observation. Frequent neuro checks. Check fasting lipids and hemoglobin A1c. Check echocardiogram and carotid Doppler. Check MRI of C-spine for hyperreflexia  Active Problems:   Mixed hyperlipidemia Continue Zocor 20 mg by mouth daily. Monitor LFTs.    Essential hypertension, benign Continue propranolol 20 mg by mouth twice a  day. Monitor blood pressure.    CORONARY ATHEROSCLEROSIS NATIVE CORONARY ARTERY Continue aspirin, propranolol and simvastatin.    Prediabetes Carbohydrate modified diet. Check hemoglobin A1c. Monitor CBG before meals.    Vitamin B12 deficiency Check vitamin B12 level. Continue oral supplementation.    GERD (gastroesophageal reflux disease) Pantoprazole 40 mg by mouth daily.     DVT prophylaxis: Lovenox SQ. Code Status: Full code. Family Communication:  Disposition Plan:  TIA workup, MRI of cervical and thoracic spine if cervical spine MRI is negative. Consults called: Neuro hospitalist team. Admission status: Observation/telemetry.   Reubin Milan MD [facility] Pager [phone].  If 7PM-7AM, please contact night-coverage [email or web address] Password [facility]  11/05/2016, 3:46 AM

## 2016-11-05 NOTE — Progress Notes (Signed)
Subjective: No further episodes  Exam: Vitals:   11/05/16 1352 11/05/16 1652  BP: (!) 119/54 (!) 116/59  Pulse: 81 81  Resp: 16 16  Temp: 97.8 F (36.6 C) 98.6 F (37 C)   Gen: In bed, NAD Resp: non-labored breathing, no acute distress Abd: soft, nt  Neuro: MS: Awake, alert, interactive and appropriate CN: Pupils equal round reactive to light, extra movements intact Motor: Moves all extremities with good strength Sensory: Intact to light touch DTR: Hyperreflexic at the knees  Pertinent Labs: LDL 60s  Impression: 83 year old male with transient episode of visual disturbance. At this time I agree that TIA is most likely. He is currently undergoing workup for this. With his history of memory problems, I will include EEG but I think this is less likely. I think that MRA is unlikely to change management at this point and given that he had difficulty holding still and required Ativan, I think we can hold off on this test.  Recommendations: 1) awaiting carotid Dopplers, echo 2) EEG 3) continue aspirin  Roland Rack, MD [facility] [phone]  If 7pm- 7am, please page neurology on call as listed in [place].

## 2016-11-05 NOTE — Progress Notes (Signed)
OT Cancellation Note  Patient Details Name: Ryan Humphrey MRN: [id number] DOB: Jun 24, 1928   Cancelled Treatment:    Reason Eval/Treat Not Completed: Patient at procedure or test/ unavailable. Pt off unit for MRI at this time. Will check back as able for OT evaluation. Thank you for this referral!  Norman Herrlich, MS OTR/L  Pager: [place] 11/05/2016, 10:14 AM

## 2016-11-05 NOTE — Progress Notes (Signed)
Patient admitted from ED. Patient alert and oriented x 4. Patient oriented to room and made comfortable. Tele placed and was verified.

## 2016-11-06 ENCOUNTER — Observation Stay (HOSPITAL_BASED_OUTPATIENT_CLINIC_OR_DEPARTMENT_OTHER): Payer: Medicare Other

## 2016-11-06 ENCOUNTER — Observation Stay (HOSPITAL_COMMUNITY)
Admit: 2016-11-06 | Discharge: 2016-11-06 | Disposition: A | Discharge status: Home / Self Care | Payer: Medicare Other | Attending: Neurology | Admitting: Neurology

## 2016-11-06 DIAGNOSIS — I10 Essential (primary) hypertension: Secondary | ICD-10-CM

## 2016-11-06 DIAGNOSIS — G459 Transient cerebral ischemic attack, unspecified: Secondary | ICD-10-CM | POA: Diagnosis not present

## 2016-11-06 DIAGNOSIS — I639 Cerebral infarction, unspecified: Secondary | ICD-10-CM

## 2016-11-06 DIAGNOSIS — R292 Abnormal reflex: Secondary | ICD-10-CM | POA: Diagnosis not present

## 2016-11-06 DIAGNOSIS — I5030 Unspecified diastolic (congestive) heart failure: Secondary | ICD-10-CM

## 2016-11-06 DIAGNOSIS — K219 Gastro-esophageal reflux disease without esophagitis: Secondary | ICD-10-CM

## 2016-11-06 DIAGNOSIS — R42 Dizziness and giddiness: Secondary | ICD-10-CM | POA: Diagnosis not present

## 2016-11-06 LAB — HEMOGLOBIN A1C
HEMOGLOBIN A1C: 5.3 % (ref 4.8–5.6)
MEAN PLASMA GLUCOSE: 105 mg/dL

## 2016-11-06 LAB — ECHOCARDIOGRAM COMPLETE
Height: 70 in
WEIGHTICAEL: 2688 [oz_av]

## 2016-11-06 LAB — GLUCOSE, CAPILLARY
GLUCOSE-CAPILLARY: 88 mg/dL (ref 65–99)
Glucose-Capillary: 107 mg/dL — ABNORMAL HIGH (ref 65–99)

## 2016-11-06 MED ORDER — ASPIRIN 325 MG PO TBEC: 325.0000 mg | DELAYED_RELEASE_TABLET | Freq: Every day | ORAL | 0 refills | Status: DC

## 2016-11-06 NOTE — Progress Notes (Signed)
EEG completed; results pending.    

## 2016-11-06 NOTE — Progress Notes (Signed)
OT Cancellation Note  Patient Details Name: Ryan Humphrey MRN: [id number] DOB: 05-30-1929   Cancelled Treatment:    Reason Eval/Treat Not Completed: Patient at procedure or test/ unavailable. Echo in progress in room on OT evaluation. Will check back as able.   Norman Herrlich, MS OTR/L  Pager: [name] 11/06/2016, 9:25 AM

## 2016-11-06 NOTE — Progress Notes (Signed)
  Echocardiogram 2D Echocardiogram has been performed.  Donata Clay 11/06/2016, 9:34 AM

## 2016-11-06 NOTE — Discharge Instructions (Signed)
Follow with Primary MD McKeown, William, MD in 7 days  ° °Get CBC, CMP,checked  by Primary MD next visit.  ° ° °Activity: As tolerated with Full fall precautions use walker/cane & assistance as needed ° ° °Disposition Home  ° ° °Diet: Heart Healthy  , with feeding assistance and aspiration precautions. ° °For Heart failure patients - Check your Weight same time everyday, if you gain over 2 pounds, or you develop in leg swelling, experience more shortness of breath or chest pain, call your Primary MD immediately. Follow Cardiac Low Salt Diet and 1.5 lit/day fluid restriction. ° ° °On your next visit with your primary care physician please Get Medicines reviewed and adjusted. ° ° °Please request your Prim.MD to go over all Hospital Tests and Procedure/Radiological results at the follow up, please get all Hospital records sent to your Prim MD by signing hospital release before you go home. ° ° °If you experience worsening of your admission symptoms, develop shortness of breath, life threatening emergency, suicidal or homicidal thoughts you must seek medical attention immediately by calling 911 or calling your MD immediately  if symptoms less severe. ° °You Must read complete instructions/literature along with all the possible adverse reactions/side effects for all the Medicines you take and that have been prescribed to you. Take any new Medicines after you have completely understood and accpet all the possible adverse reactions/side effects.  ° °Do not drive, operating heavy machinery, perform activities at heights, swimming or participation in water activities or provide baby sitting services if your were admitted for syncope or siezures until you have seen by Primary MD or a Neurologist and advised to do so again. ° °Do not drive when taking Pain medications.  ° ° °Do not take more than prescribed Pain, Sleep and Anxiety Medications ° °Special Instructions: If you have smoked or chewed Tobacco  in the last 2 yrs  please stop smoking, stop any regular Alcohol  and or any Recreational drug use. ° °Wear Seat belts while driving. ° ° °Please note ° °You were cared for by a hospitalist during your hospital stay. If you have any questions about your discharge medications or the care you received while you were in the hospital after you are discharged, you can call the unit and asked to speak with the hospitalist on call if the hospitalist that took care of you is not available. Once you are discharged, your primary care physician will handle any further medical issues. Please note that NO REFILLS for any discharge medications will be authorized once you are discharged, as it is imperative that you return to your primary care physician (or establish a relationship with a primary care physician if you do not have one) for your aftercare needs so that they can reassess your need for medications and monitor your lab values. ° °

## 2016-11-06 NOTE — Discharge Summary (Signed)
Ryan Humphrey, is a 83 y.o. male  DOB [birth year]  MRN [id number].  Admission date:  11/04/2016  Admitting Physician  Reubin Milan, MD  Discharge Date:  11/06/2016   Primary MD  Unk Pinto, MD  Recommendations for primary care physician for things to follow:  - Please check CBC, BMP during next visit   Admission Diagnosis  Reflexes abnormal [R29.2]   Discharge Diagnosis  Reflexes abnormal [R29.2]    Principal Problem:   TIA (transient ischemic attack) Active Problems:   Mixed hyperlipidemia   Essential hypertension, benign   CORONARY ATHEROSCLEROSIS NATIVE CORONARY ARTERY   Prediabetes   Vitamin B12 deficiency   GERD (gastroesophageal reflux disease)      Past Medical History:  Diagnosis Date  . Anemia   . Arthritis    arthritis,osteopenia,"spinal stenosis"  . CAD (coronary artery disease)   . Cancer [facility]) 12-06-12   Prostate cancer'98  . Depression   . GERD (gastroesophageal reflux disease)   . H/O hiatal hernia   . H/O vertigo 12-06-12   none recent  . Hyperlipidemia   . IBS (irritable bowel syndrome)   . Neuropathy   . Osteopenia   . Peripheral neuropathy 12-06-12   peripheral neuropathy  . Rhinitis   . RLS (restless legs syndrome)   . Vitamin B12 deficiency     Past Surgical History:  Procedure Laterality Date  . APPENDECTOMY    . CARPAL TUNNEL RELEASE     both hands  . cataract surgery Left 12-06-12   recent surgery  . CHOLECYSTECTOMY    . COLONOSCOPY WITH PROPOFOL N/A 12/25/2012   Procedure: COLONOSCOPY WITH PROPOFOL;  Surgeon: Garlan Fair, MD;  Location: [facility];  Service: Endoscopy;  Laterality: N/A;  . ESOPHAGOGASTRODUODENOSCOPY (EGD) WITH PROPOFOL N/A 12/25/2012   Procedure: ESOPHAGOGASTRODUODENOSCOPY (EGD) WITH PROPOFOL;  Surgeon: Garlan Fair, MD;  Location: [facility];  Service: Endoscopy;  Laterality: N/A;  . PROSTATE SURGERY  12-06-12   . TONSILLECTOMY    . TOTAL KNEE ARTHROPLASTY Right 11/25/2014   Procedure: RIGHT TOTAL KNEE ARTHROPLASTY;  Surgeon: Melrose Nakayama, MD;  Location: [place];  Service: Orthopedics;  Laterality: Right;       History of present illness and  Hospital Course:     Kindly see H&P for history of present illness and admission details, please review complete Labs, Consult reports and Test reports for all details in brief  HPI  from the history and physical done on the day of admission 11/05/2016  HPI: Ryan Humphrey is a 83 y.o. male with medical history significant of anemia, osteoarthritis, osteopenia, coronary artery disease, prostate cancer, depression, GERD, history of a hiatal hernia, history of vertigo, hyperlipidemia, IBS, neuropathy, restless leg syndrome, vitamin B12 deficiency who is coming to the emergency department via EMS due to dizziness while driving.  Per patient, he was driving his niece to see her father, when his vision "broke down. "Everything on his vision became very fractured. He also had dizziness. He stopped for a while and  his patient got better. He decided to go back home, but stopped at the urgent care center on the way home, was given aspirin and subsequently transferred to the emergency department via EMS. He denies slurred speech, language comprehension impairment, focal extremity weakness or numbness. He mentions that his left face felt with a strain sensation of mild pain. He denies nausea, emesis, chest pain, dyspnea, palpitations, diaphoresis or abdominal pain. He denies diarrhea, constipation, melena or hematochezia. He denies dysuria, frequency or hematuria.  ED Course: He was seen by neurology. Urinalysis was unremarkable. WBC 6.2, hemoglobin 12.5 g/dL and platelets 113. Sodium 139, potassium 4.4, chloride 108 and bicarbonate 25 mmol/L. BUN was 18, creatinine 1.34 and glucose 104 mg/dL. EKG was sinus rhythm with LBBB.   Imaging: MRI of the brain did not show any  acute intracranial abnormality. There was the mild atrophy, chronic microvascular ischemic changes and mild upper cervical spinal Stenosis.  Hospital Course   TIA  - Patient presents with transient episode of visual disturbances, most likely TIA,MRI brain with no evidence of acute CVA, admitted for workup, 2-D echo with no evidence of metabolic source, carotid Doppler with no significant stenosis, vertebral artery flow is antegrade , LDL is 61, EEG as well was obtained, which was normal, patient supposed to be on aspirin 325 mg at home, but reports he has not been taking now for a while, so he was instructed to continue with aspirin. - MRI cervical/proximal thoracic spine pending to rule out subclinical cord compression per neuro recommendation given 4+ patellar reflexes with crossed adductor response, with no evidence of cord compression.   Mixed hyperlipidemia - Continue Zocor 20 mg by mouth daily.LDL is 61.  Essential hypertension, benign - Continue propranolol 20 mg by mouth twice a day.  CORONARY ATHEROSCLEROSIS NATIVE CORONARY ARTERY - Continue aspirin, propranolol and simvastatin.  Prediabetes Hemoglobin A1c is 5.3, within normal limits  Vitamin B12 deficiency vitamin B12 level is 919 Continue oral supplementation.  GERD (gastroesophageal reflux disease) Pantoprazole 40 mg by mouth daily.     Discharge Condition:  Stable   Follow UP  Follow-up Information    Unk Pinto, MD Follow up in 1 week(s).   Specialty:  Internal Medicine Contact information: [address] [place] [address] [phone]             Discharge Instructions  and  Discharge Medications    Discharge Instructions    Discharge instructions    Complete by:  As directed    Follow with Primary MD Unk Pinto, MD in 7 days   Get CBC, CMP,checked  by Primary MD next visit.    Activity: As tolerated with Full fall precautions use walker/cane &  assistance as needed   Disposition Home    Diet: Heart Healthy  , with feeding assistance and aspiration precautions.  For Heart failure patients - Check your Weight same time everyday, if you gain over 2 pounds, or you develop in leg swelling, experience more shortness of breath or chest pain, call your Primary MD immediately. Follow Cardiac Low Salt Diet and 1.5 lit/day fluid restriction.   On your next visit with your primary care physician please Get Medicines reviewed and adjusted.   Please request your Prim.MD to go over all Hospital Tests and Procedure/Radiological results at the follow up, please get all Hospital records sent to your Prim MD by signing hospital release before you go home.   If you experience worsening of your admission symptoms, develop shortness  of breath, life threatening emergency, suicidal or homicidal thoughts you must seek medical attention immediately by calling 911 or calling your MD immediately  if symptoms less severe.  You Must read complete instructions/literature along with all the possible adverse reactions/side effects for all the Medicines you take and that have been prescribed to you. Take any new Medicines after you have completely understood and accpet all the possible adverse reactions/side effects.   Do not drive, operating heavy machinery, perform activities at heights, swimming or participation in water activities or provide baby sitting services if your were admitted for syncope or siezures until you have seen by Primary MD or a Neurologist and advised to do so again.  Do not drive when taking Pain medications.    Do not take more than prescribed Pain, Sleep and Anxiety Medications  Special Instructions: If you have smoked or chewed Tobacco  in the last 2 yrs please stop smoking, stop any regular Alcohol  and or any Recreational drug use.  Wear Seat belts while driving.   Please note  You were cared for by a hospitalist during your  hospital stay. If you have any questions about your discharge medications or the care you received while you were in the hospital after you are discharged, you can call the unit and asked to speak with the hospitalist on call if the hospitalist that took care of you is not available. Once you are discharged, your primary care physician will handle any further medical issues. Please note that NO REFILLS for any discharge medications will be authorized once you are discharged, as it is imperative that you return to your primary care physician (or establish a relationship with a primary care physician if you do not have one) for your aftercare needs so that they can reassess your need for medications and monitor your lab values.   Increase activity slowly    Complete by:  As directed      Allergies as of 11/06/2016      Reactions   Niacin Itching   Penicillins Other (See Comments)   "rash"   Tramadol Itching      Medication List    TAKE these medications   alendronate 70 MG tablet Commonly known as:  FOSAMAX Take 1 tablet (70 mg total) by mouth every Saturday. Take with a full glass of water on an empty stomach.   aspirin 325 MG EC tablet Take 1 tablet (325 mg total) by mouth at bedtime.   CALTRATE 600+D PO Take 1 tablet by mouth daily.   diphenoxylate-atropine 2.5-0.025 MG tablet Commonly known as:  LOMOTIL TAKE ONE TABLET BY MOUTH 4 TIMES DAILY AS NEEDED FOR DIARRHEA OR  LOOSE  STOOLS   gabapentin 400 MG capsule Commonly known as:  NEURONTIN Take 400 mg by mouth 3 (three) times daily.   hyoscyamine 0.125 MG SL tablet Commonly known as:  LEVSIN SL Place 0.125 mg under the tongue every 6 (six) hours as needed for cramping.   propranolol 20 MG tablet Commonly known as:  INDERAL Take 1 tablet (20 mg total) by mouth 2 (two) times daily.   simvastatin 20 MG tablet Commonly known as:  ZOCOR Take 1 tablet (20 mg total) by mouth every evening.   Vitamin D 2000 units tablet Take  8,000 Units by mouth daily.        Diet and Activity recommendation: See Discharge Instructions above   Consults obtained -  Neurology   Major procedures and Radiology Reports - PLEASE  review detailed and final reports for all details, in brief -    Dg Chest 2 View  Result Date: 10/11/2016 CLINICAL DATA:  Cough, abnormal breath sounds in the left lower lobe. EXAM: CHEST  2 VIEW COMPARISON:  11/27/2014 FINDINGS: Mild hyperinflation. Heart and mediastinal contours are within normal limits. No focal opacities or effusions. No acute bony abnormality. IMPRESSION: Hyperinflation.  No active cardiopulmonary disease. Electronically Signed   By: Rolm Baptise M.D.   On: 10/11/2016 11:13   Mr Brain Wo Contrast (neuro Protocol)  Result Date: 11/04/2016 CLINICAL DATA:  Dizziness EXAM: MRI HEAD WITHOUT CONTRAST TECHNIQUE: Multiplanar, multiecho pulse sequences of the brain and surrounding structures were obtained without intravenous contrast. COMPARISON:  1. Head CT 01/03/2015 2. Brain MRI 02/02/2010 FINDINGS: Brain: The midline structures are normal. There is no focal diffusion restriction to indicate acute infarct. There is mild hyperintense T2-weighted signal within the periventricular white matter, most often seen in the setting of chronic microvascular ischemia. No intraparenchymal hematoma or chronic microhemorrhage. There is mild volume loss of the cerebrum and cerebellar atrophy. The dura is normal and there is no extra-axial collection. Vascular: Major intracranial arterial and venous sinus flow voids are preserved. Skull and upper cervical spine: Incompletely visualized upper cervical spine is at least mildly stenotic. Sinuses/Orbits: No fluid levels or advanced mucosal thickening. No mastoid effusion. Normal orbits. IMPRESSION: 1. No acute intracranial abnormality. 2. Mild atrophy and chronic microvascular ischemia. 3. At least mild upper cervical spinal canal stenosis, incompletely visualized on  this examination. Electronically Signed   By: Ulyses Jarred M.D.   On: 11/04/2016 17:39   Mr Cervical Spine Wo Contrast  Result Date: 11/05/2016 CLINICAL DATA:  Abnormal patellar reflexes. Dizziness and visual disturbance. EXAM: MRI CERVICAL AND THORACIC SPINE WITHOUT CONTRAST TECHNIQUE: Multiplanar and multiecho pulse sequences of the cervical spine, to include the craniocervical junction and cervicothoracic junction, and thoracic spine, were obtained without intravenous contrast. COMPARISON:  None. FINDINGS: MRI CERVICAL SPINE FINDINGS Some sequences are moderately motion degraded. Alignment: Grade 1 anterolisthesis of C5 on C6. Trace retrolisthesis of C3 on C4. Vertebrae: No fracture, suspicious osseous lesion, or significant marrow edema is identified. A T1 vertebral body hemangioma is noted. There is solid interbody ankylosis at C6-7, with likely left-sided facet ankylosis as well. Cord: No spinal cord signal abnormality identified within limitations of motion artifact. Posterior Fossa, vertebral arteries, paraspinal tissues: Partially visualized cerebral and cerebellar atrophy. No significant paraspinal soft tissue abnormality. Grossly preserved vertebral artery flow voids. Disc levels: C2-3: Mild disc bulging, uncovertebral spurring, and moderate right facet arthrosis result in likely moderate right and mild left neural foraminal stenosis without spinal stenosis. C3-4: Disc bulging, uncovertebral spurring, infolding of the ligamentum flavum, and moderate right facet arthrosis result in moderate to severe spinal stenosis with mild cord flattening and severe right greater than left neural foraminal stenosis. C4-5: Disc bulging, uncovertebral spurring, infolding of the ligamentum flavum, and mild-to-moderate facet arthrosis result in moderate to severe spinal stenosis with mild cord flattening and severe bilateral neural foraminal stenosis. C5-6: Listhesis with bulging uncovered disc, uncovertebral spurring,  and moderate right facet arthrosis result in moderate spinal stenosis and severe right and moderate left neural foraminal stenosis. C6-7:  Interbody fusion.  No significant stenosis. C7-T1: Disc bulging and right greater than left uncovertebral spurring result in moderate right neural foraminal stenosis without spinal stenosis. T1-2: Disc bulging and right uncovertebral spurring result in mild right neural foraminal stenosis without spinal stenosis. T2-3:  Mild bilateral facet arthrosis  without significant stenosis. MRI THORACIC SPINE FINDINGS The study is motion degraded, with severe motion on the axial sequences (particularly the gradient echo sequence). Alignment: Mildly increased thoracic kyphosis. No listhesis. Vertebrae: No fracture, suspicious osseous lesion, or significant marrow edema. Cord: Grossly normal cord signal within limitations of motion artifact. Paraspinal and other soft tissues: Grossly unremarkable. Disc levels: Mild thoracic spondylosis without evidence of significant spinal or neural foraminal stenosis within limitations of motion artifact. Bilateral facet arthrosis at T12-L1. Advanced disc space narrowing at L1-2 with circumferential disc bulging, spurring, and facet and ligamentum flavum hypertrophy resulting in mild-to-moderate spinal stenosis. Disc bulging and posterior element hypertrophy at L2-3 likely result in moderate to severe spinal stenosis, poorly visualized due to motion. IMPRESSION: 1. Motion degraded examinations, with severe motion in the thoracic spine. No gross spinal cord signal abnormality within this limitation. 2. Cervical spine disc and facet degeneration with moderate to severe spinal stenosis at C3-4 and C4-5 with mild cord flattening. 3. Moderate spinal stenosis at C5-6. 4. No thoracic spinal stenosis. 5. Suspected mild-to-moderate spinal stenosis at L1-2 and moderate to possibly severe spinal stenosis at L2-3, incompletely evaluated. Electronically Signed   By:  Logan Bores M.D.   On: 11/05/2016 14:14   Mr Thoracic Spine Wo Contrast  Result Date: 11/05/2016 CLINICAL DATA:  Abnormal patellar reflexes. Dizziness and visual disturbance. EXAM: MRI CERVICAL AND THORACIC SPINE WITHOUT CONTRAST TECHNIQUE: Multiplanar and multiecho pulse sequences of the cervical spine, to include the craniocervical junction and cervicothoracic junction, and thoracic spine, were obtained without intravenous contrast. COMPARISON:  None. FINDINGS: MRI CERVICAL SPINE FINDINGS Some sequences are moderately motion degraded. Alignment: Grade 1 anterolisthesis of C5 on C6. Trace retrolisthesis of C3 on C4. Vertebrae: No fracture, suspicious osseous lesion, or significant marrow edema is identified. A T1 vertebral body hemangioma is noted. There is solid interbody ankylosis at C6-7, with likely left-sided facet ankylosis as well. Cord: No spinal cord signal abnormality identified within limitations of motion artifact. Posterior Fossa, vertebral arteries, paraspinal tissues: Partially visualized cerebral and cerebellar atrophy. No significant paraspinal soft tissue abnormality. Grossly preserved vertebral artery flow voids. Disc levels: C2-3: Mild disc bulging, uncovertebral spurring, and moderate right facet arthrosis result in likely moderate right and mild left neural foraminal stenosis without spinal stenosis. C3-4: Disc bulging, uncovertebral spurring, infolding of the ligamentum flavum, and moderate right facet arthrosis result in moderate to severe spinal stenosis with mild cord flattening and severe right greater than left neural foraminal stenosis. C4-5: Disc bulging, uncovertebral spurring, infolding of the ligamentum flavum, and mild-to-moderate facet arthrosis result in moderate to severe spinal stenosis with mild cord flattening and severe bilateral neural foraminal stenosis. C5-6: Listhesis with bulging uncovered disc, uncovertebral spurring, and moderate right facet arthrosis result in  moderate spinal stenosis and severe right and moderate left neural foraminal stenosis. C6-7:  Interbody fusion.  No significant stenosis. C7-T1: Disc bulging and right greater than left uncovertebral spurring result in moderate right neural foraminal stenosis without spinal stenosis. T1-2: Disc bulging and right uncovertebral spurring result in mild right neural foraminal stenosis without spinal stenosis. T2-3:  Mild bilateral facet arthrosis without significant stenosis. MRI THORACIC SPINE FINDINGS The study is motion degraded, with severe motion on the axial sequences (particularly the gradient echo sequence). Alignment: Mildly increased thoracic kyphosis. No listhesis. Vertebrae: No fracture, suspicious osseous lesion, or significant marrow edema. Cord: Grossly normal cord signal within limitations of motion artifact. Paraspinal and other soft tissues: Grossly unremarkable. Disc levels: Mild thoracic spondylosis without evidence  of significant spinal or neural foraminal stenosis within limitations of motion artifact. Bilateral facet arthrosis at T12-L1. Advanced disc space narrowing at L1-2 with circumferential disc bulging, spurring, and facet and ligamentum flavum hypertrophy resulting in mild-to-moderate spinal stenosis. Disc bulging and posterior element hypertrophy at L2-3 likely result in moderate to severe spinal stenosis, poorly visualized due to motion. IMPRESSION: 1. Motion degraded examinations, with severe motion in the thoracic spine. No gross spinal cord signal abnormality within this limitation. 2. Cervical spine disc and facet degeneration with moderate to severe spinal stenosis at C3-4 and C4-5 with mild cord flattening. 3. Moderate spinal stenosis at C5-6. 4. No thoracic spinal stenosis. 5. Suspected mild-to-moderate spinal stenosis at L1-2 and moderate to possibly severe spinal stenosis at L2-3, incompletely evaluated. Electronically Signed   By: Logan Bores M.D.   On: 11/05/2016 14:14     Micro Results     No results found for this or any previous visit (from the past 240 hour(s)).     Today   Subjective:   Asaph Serena today has no headache,no chest abdominal pain,no new weakness tingling or numbness, feels much better wants to go home today.   Objective:   Blood pressure (!) 107/51, pulse 73, temperature 97.5 F (36.4 C), temperature source Oral, resp. rate 20, height 5\' 10"  (1.778 m), weight 76.2 kg (168 lb), SpO2 98 %.   Intake/Output Summary (Last 24 hours) at 11/06/16 1547 Last data filed at 11/06/16 1229  Gross per 24 hour  Intake              220 ml  Output                0 ml  Net              220 ml    Exam Awake Alert, Oriented x 3, No new F.N deficits, Normal affect Supple Neck,No JVD Symmetrical Chest wall movement, Good air movement bilaterally, CTAB RRR,No Gallops,Rubs or new Murmurs, No Parasternal Heave +ve B.Sounds, Abd Soft, Non tender, No rebound -guarding or rigidity. No Cyanosis, Clubbing or edema, No new Rash or bruise  Data Review   CBC w Diff:  Lab Results  Component Value Date   WBC 6.7 11/05/2016   HGB 12.8 (L) 11/05/2016   HCT 38.3 (L) 11/05/2016   PLT 123 (L) 11/05/2016   LYMPHOPCT 21 11/04/2016   MONOPCT 6 11/04/2016   EOSPCT 2 11/04/2016   BASOPCT 0 11/04/2016    CMP:  Lab Results  Component Value Date   NA 142 11/05/2016   K 4.4 11/05/2016   CL 106 11/05/2016   CO2 29 11/05/2016   BUN 15 11/05/2016   CREATININE 1.34 (H) 11/05/2016   CREATININE 1.55 (H) 04/22/2016   PROT 6.1 (L) 11/05/2016   ALBUMIN 3.9 11/05/2016   BILITOT 1.0 11/05/2016   ALKPHOS 64 11/05/2016   AST 16 11/05/2016   ALT 11 (L) 11/05/2016  .   Total Time in preparing paper work, data evaluation and todays exam - 35 minutes  Tytus Strahle M.D on 11/06/2016 at 3:47 PM  [facility]   Office  [phone]

## 2016-11-06 NOTE — Progress Notes (Addendum)
Subjective: No further episodes  Exam: Vitals:   11/06/16 0400 11/06/16 1056  BP: 103/60 105/68  Pulse: 93 76  Resp: 18 18  Temp: 97.5 F (36.4 C) 98 F (36.7 C)   Gen: In bed, NAD Resp: non-labored breathing, no acute distress Abd: soft, nt  Neuro: MS: Awake, alert, interactive and appropriate CN: Pupils equal round reactive to light, extra movements intact Motor: Moves all extremities with good strength Sensory: Intact to light touch DTR: Hyperreflexic at the knees  Pertinent Labs: LDL 60s  Impression: 83 year old male with transient episode of visual disturbance. At this time I agree that TIA is most likely. He is currently undergoing workup for this. With his history of memory problems, I will include EEG but I think this is less likely.  Recommendations: 1) awaiting carotid Dopplers 2) EEG 3) continue aspirin 4) If no findings of concern on EEG or dopplers, then neuro will sign off.    Roland Rack, MD [facility] [phone]  If 7pm- 7am, please page neurology on call as listed in [place].

## 2016-11-06 NOTE — Procedures (Signed)
History: 83 yo M with transient visual disturbance.   Sedation: None  Technique: This is a 21 channel routine scalp EEG performed at the bedside with bipolar and monopolar montages arranged in accordance to the international 10/20 system of electrode placement. One channel was dedicated to EKG recording.    Background: The background consists of intermixed alpha and beta activities. There is a well defined posterior dominant rhythm of 9 Hz that attenuates with eye opening. Sleep is not recorded.   Photic stimulation: Physiologic driving is not performed  EEG Abnormalities: none  Clinical Interpretation: This normal EEG is recorded in the waking state. There was no seizure or seizure predisposition recorded on this study. Please note that a normal EEG does not preclude the possibility of epilepsy.   Roland Rack, MD [facility] [phone]  If 7pm- 7am, please page neurology on call as listed in [place].

## 2016-11-06 NOTE — Progress Notes (Addendum)
Occupational Therapy Treatment Patient Details Name: Ryan Humphrey MRN: [id number] DOB: 09/25/1928 Today's Date: 11/06/2016    History of present illness Pt is an 83 y/o male admitted secondary to dizziness found to have a TIA. PMH including but not limited to CAD, HLD, neuropathy and R TKA in 2016.   OT comments  Pt progressing well toward OT goals. Challenged higher level cognitive skills with simulated financial and medication management tasks in minimally distracting environment. Pt able to complete with minimal verbal cues this session. He reports completing the majority of financial management tasks online. Pt's daughter present at end of session and reports that she provides intermittent supervision for pt. Educated pt and daughter concerning compensatory strategies for memory and improving safety post-acute D/C. Updated D/C recommendation to intermittent supervision for financial and medication management tasks due to pt progress. OT will continue to follow acutely.    Follow Up Recommendations  No OT follow up;Supervision - Intermittent    Equipment Recommendations  None recommended by OT    Recommendations for Other Services      Precautions / Restrictions Precautions Precautions: Fall Restrictions Weight Bearing Restrictions: No       Mobility Bed Mobility Overal bed mobility: Modified Independent             General bed mobility comments: Able to compete without physical assistance this session.   Transfers Overall transfer level: Needs assistance Equipment used: None Transfers: Sit to/from Stand Sit to Stand: Supervision         General transfer comment: Impulsivity remains but improved safety this session.     Balance Overall balance assessment: Needs assistance Sitting-balance support: Feet supported Sitting balance-Leahy Scale: Good     Standing balance support: During functional activity;No upper extremity supported Standing balance-Leahy  Scale: Good                             ADL either performed or assessed with clinical judgement   ADL Overall ADL's : Needs assistance/impaired                         Toilet Transfer: Ambulation;Supervision/safety             General ADL Comments: Pt able to complete simulated IADL tasks for financial management and medication management with minimal VC's. Pt demonstrates good understanding of memory deficits and implements compensatory techniques. He is able to verbalize medication and financial management techniques that he uses at home. Discussed with pt's daughter who reports that she checks in with pt and he verbalizes to her when he has difficulty.      Vision   Additional Comments: Able to read and utilize vision functionally throughout.   Perception     Praxis      Cognition Arousal/Alertness: Awake/alert Behavior During Therapy: WFL for tasks assessed/performed Overall Cognitive Status: Impaired/Different from baseline Area of Impairment: Memory;Problem solving                     Memory: Decreased short-term memory       Problem Solving: Difficulty sequencing;Requires verbal cues          Exercises     Shoulder Instructions       General Comments      Pertinent Vitals/ Pain       Pain Assessment: No/denies pain  Home Living  Prior Functioning/Environment              Frequency  Min 2X/week        Progress Toward Goals  OT Goals(current goals can now be found in the care plan section)  Progress towards OT goals: Progressing toward goals  Acute Rehab OT Goals Patient Stated Goal: go home today OT Goal Formulation: With patient/family Time For Goal Achievement: 11/19/16 Potential to Achieve Goals: Good ADL Goals Pt Will Perform Grooming: with modified independence;standing Pt Will Transfer to Toilet: with modified  independence;ambulating;regular height toilet;grab bars Pt Will Perform Toileting - Clothing Manipulation and hygiene: with modified independence;sit to/from stand Additional ADL Goal #1: Pt will complete financial management task with no more than 1 VC for problem solving.  Additional ADL Goal #2: Pt will independently incorporate 3 strategies to compensate for decreased short-term memory into daily ADL routine.   Plan Discharge plan remains appropriate    Co-evaluation                 AM-PAC PT "6 Clicks" Daily Activity     Outcome Measure   Help from another person eating meals?: None Help from another person taking care of personal grooming?: A Little Help from another person toileting, which includes using toliet, bedpan, or urinal?: A Little Help from another person bathing (including washing, rinsing, drying)?: A Little Help from another person to put on and taking off regular upper body clothing?: A Little Help from another person to put on and taking off regular lower body clothing?: A Little 6 Click Score: 19    End of Session    OT Visit Diagnosis: Unsteadiness on feet (R26.81);Other symptoms and signs involving cognitive function   Activity Tolerance Patient tolerated treatment well   Patient Left in bed;with call bell/phone within reach;with bed alarm set   Nurse Communication Mobility status    Functional Assessment Tool Used: Clinical judgement Functional Limitation: Self care Self Care Current Status (W1191): At least 1 percent but less than 20 percent impaired, limited or restricted Self Care Goal Status (Y7829): 0 percent impaired, limited or restricted   Time: 1000-1017 OT Time Calculation (min): 17 min  Charges: OT G-codes **NOT FOR INPATIENT CLASS** Functional Assessment Tool Used: Clinical judgement Functional Limitation: Self care Self Care Current Status (F6213): At least 1 percent but less than 20 percent impaired, limited or restricted Self  Care Goal Status (Y8657): 0 percent impaired, limited or restricted OT General Charges $OT Visit: 1 Procedure OT Treatments $Self Care/Home Management : 8-22 mins  Norman Herrlich, MS OTR/L  Pager: [phone] 11/06/2016, 11:51 AM

## 2016-11-06 NOTE — Progress Notes (Signed)
Patient states he has a lot of new stress at home; niece with disability has moved in with him due to her mother passing away. He is requesting medication for stress and anxiety; MD notified.

## 2016-11-06 NOTE — Progress Notes (Signed)
VASCULAR LAB PRELIMINARY  PRELIMINARY  PRELIMINARY  PRELIMINARY  Carotid duplex completed.    Preliminary report:  1-39% ICA plaquing. Vertebral artery flow is antegrade.   Dalilah Curlin, RVT 11/06/2016, 3:19 PM

## 2016-11-07 LAB — VAS US CAROTID
LCCADDIAS: 19 cm/s
LEFT ECA DIAS: -4 cm/s
LEFT VERTEBRAL DIAS: 15 cm/s
LICADSYS: -78 cm/s
LICAPSYS: -75 cm/s
Left CCA dist sys: 73 cm/s
Left CCA prox dias: 18 cm/s
Left CCA prox sys: 122 cm/s
Left ICA dist dias: -23 cm/s
Left ICA prox dias: -26 cm/s
RIGHT ECA DIAS: -3 cm/s
RIGHT VERTEBRAL DIAS: -23 cm/s
Right CCA prox dias: -17 cm/s
Right CCA prox sys: -78 cm/s
Right cca dist sys: -97 cm/s

## 2016-11-09 ENCOUNTER — Ambulatory Visit: Payer: Self-pay | Admitting: Internal Medicine

## 2016-11-10 ENCOUNTER — Encounter: Payer: Self-pay | Admitting: Internal Medicine

## 2016-11-10 ENCOUNTER — Ambulatory Visit (INDEPENDENT_AMBULATORY_CARE_PROVIDER_SITE_OTHER): Payer: Medicare Other | Admitting: Physician Assistant

## 2016-11-10 VITALS — BP 124/60 | HR 91 | Temp 97.3°F | Resp 14 | Ht 70.0 in | Wt 172.6 lb

## 2016-11-10 DIAGNOSIS — N183 Chronic kidney disease, stage 3 (moderate): Secondary | ICD-10-CM | POA: Diagnosis not present

## 2016-11-10 DIAGNOSIS — E782 Mixed hyperlipidemia: Secondary | ICD-10-CM

## 2016-11-10 DIAGNOSIS — I10 Essential (primary) hypertension: Secondary | ICD-10-CM

## 2016-11-10 DIAGNOSIS — E1122 Type 2 diabetes mellitus with diabetic chronic kidney disease: Secondary | ICD-10-CM

## 2016-11-10 DIAGNOSIS — G3184 Mild cognitive impairment, so stated: Secondary | ICD-10-CM | POA: Diagnosis not present

## 2016-11-10 DIAGNOSIS — E559 Vitamin D deficiency, unspecified: Secondary | ICD-10-CM | POA: Diagnosis not present

## 2016-11-10 DIAGNOSIS — G459 Transient cerebral ischemic attack, unspecified: Secondary | ICD-10-CM

## 2016-11-10 MED ORDER — SERTRALINE HCL 50 MG PO TABS: ORAL_TABLET | ORAL | 1 refills | Status: DC

## 2016-11-10 MED ORDER — IPRATROPIUM BROMIDE 0.03 % NA SOLN: 2.0000 | Freq: Three times a day (TID) | NASAL | 2 refills | Status: DC

## 2016-11-10 NOTE — Progress Notes (Signed)
Hospital follow up  Assessment and Plan:  Essential hypertension, benign Tight BP control, continue medications Follow up eye doctor -     CBC with Differential/Platelet -     BASIC METABOLIC PANEL WITH GFR -     Hepatic function panel  Hyperlipidemia, mixed -continue medications, check lipids, decrease fatty foods, increase activity.   Type 2 diabetes mellitus with stage 3 chronic kidney disease, without long-term current use of insulin (HCC) Discussed general issues about diabetes pathophysiology and management., Educational material distributed., Suggested low cholesterol diet., Encouraged aerobic exercise., Discussed foot care., Reminded to get yearly retinal exam. Get eye exam Last A1C at goal  MCI (mild cognitive impairment) Discussed vascular dementia, continue medical management, continue ASA 325  Transient cerebral ischemia, unspecified type No prodrome, no history of migraines, no confusion/post ictal afterwards Declines holter monitor or additional work up at this time continue medical management, continue ASA 325  Control blood pressure, cholesterol, glucose, increase exercise.   Depression, remission, partial -     sertraline (ZOLOFT) 50 MG tablet; 1 pill daily for mood   -     ipratropium (ATROVENT) 0.03 % nasal spray; Place 2 sprays into the nose 3 (three) times daily.  Hospital discharge meds were reviewed, and reconciled with the patient.   Medications Discontinued During This Encounter  Medication Reason  . hyoscyamine (LEVSIN SL) 0.125 MG SL tablet Completed Course  . alendronate (FOSAMAX) 70 MG tablet     Over 40 minutes of exam, counseling, chart review, and complex, high/moderate level critical decision making was performed this visit.   HPI 83 y.o.male with history of HTN, chol, preDM  presents for follow up for transition from recent hospitalization. Admit date to the hospital was 11/04/16, patient was discharged from the hospital on 11/06/16 and our  clinical staff contacted the office the day after discharge to set up a follow up appointment, patient was admitted for:   Possible TIA, he had dizziness with driving with abnormal vision both eyes felt "fragmented car", lasted 2 hours, did not feel bad, pulled over, went to an UC and transferred to a hospital. No confusion after the event, no HA or history of migraines.  He had normal CBC, normal Kidney other than CKD and low total protein, EKG showed LBBB. MRI showed chronic microvascular changes and cervical spine stenosis but no stroke. He had normal echo, normal carotid dopplers, LDL at 61, and EEG was normal. B12 was normal. States he has felt occ "turn over" his his chest and palpitations for a long time. He is on 325 ASA.   Niece lives with him now, he was taken off zoloft because he did not need it anymore, however he has been having some stress with his niece living with him and this stress and would like to go back on it. She has had 4-5 strokes, no use of her arm, and he is taking care of her and helping with her, lots of stress/lots of changes.   He also complains of rhinorrhea.   Blood pressure 124/60, pulse 91, temperature 97.3 F (36.3 C), resp. rate 14, height 5\' 10"  (1.778 m), weight 172 lb 9.6 oz (78.3 kg), SpO2 91 %.  Lab Results  Component Value Date   CHOL 123 11/05/2016   HDL 36 (L) 11/05/2016   LDLCALC 61 11/05/2016   TRIG 128 11/05/2016   CHOLHDL 3.4 11/05/2016   Lab Results  Component Value Date   HGBA1C 5.3 11/05/2016    Future Appointments Date  Time Provider [place]  05/23/2017 10:00 AM Unk Pinto, MD [facility] None  08/21/2017 9:30 AM Alda Berthold, DO [facility] None     Past Medical History:  Diagnosis Date  . Anemia   . Arthritis    arthritis,osteopenia,"spinal stenosis"  . CAD (coronary artery disease)   . Cancer [facility]) 12-06-12   Prostate cancer'98  . Depression   . GERD (gastroesophageal reflux disease)   . H/O hiatal hernia    . H/O vertigo 12-06-12   none recent  . Hyperlipidemia   . IBS (irritable bowel syndrome)   . Neuropathy   . Osteopenia   . Peripheral neuropathy 12-06-12   peripheral neuropathy  . Rhinitis   . RLS (restless legs syndrome)   . Vitamin B12 deficiency      Allergies  Allergen Reactions  . Niacin Itching  . Penicillins Other (See Comments)    "rash"  . Tramadol Itching      Current Outpatient Prescriptions on File Prior to Visit  Medication Sig Dispense Refill  . aspirin EC 325 MG EC tablet Take 1 tablet (325 mg total) by mouth at bedtime. 30 tablet 0  . Calcium Carbonate-Vitamin D (CALTRATE 600+D PO) Take 1 tablet by mouth daily.     . Cholecalciferol (VITAMIN D) 2000 UNITS tablet Take 8,000 Units by mouth daily.    . diphenoxylate-atropine (LOMOTIL) 2.5-0.025 MG tablet TAKE ONE TABLET BY MOUTH 4 TIMES DAILY AS NEEDED FOR DIARRHEA OR  LOOSE  STOOLS 90 tablet 1  . gabapentin (NEURONTIN) 400 MG capsule Take 400 mg by mouth 3 (three) times daily.    . propranolol (INDERAL) 20 MG tablet Take 1 tablet (20 mg total) by mouth 2 (two) times daily. 180 tablet 3  . simvastatin (ZOCOR) 20 MG tablet Take 1 tablet (20 mg total) by mouth every evening. 90 tablet 3   No current facility-administered medications on file prior to visit.     ROS: all negative except above.   Physical Exam: Filed Weights   11/10/16 1535  Weight: 172 lb 9.6 oz (78.3 kg)   BP 124/60   Pulse 91   Temp 97.3 F (36.3 C)   Resp 14   Ht 5\' 10"  (1.778 m)   Wt 172 lb 9.6 oz (78.3 kg)   SpO2 91%   BMI 24.77 kg/m  General Appearance: Well nourished, in no apparent distress. Eyes: PERRLA, EOMs, conjunctiva no swelling or erythema Sinuses: No Frontal/maxillary tenderness ENT/Mouth: Ext aud canals clear, TMs without erythema, bulging. No erythema, swelling, or exudate on post pharynx.  Tonsils not swollen or erythematous. Hearing normal.  Neck: Supple, thyroid normal.  Respiratory: Respiratory effort normal,  BS equal bilaterally without rales, rhonchi, wheezing or stridor.  Cardio: RRR with no MRGs. Brisk peripheral pulses without edema.  Abdomen: Soft, + BS.  Non tender, no guarding, rebound, hernias, masses. Lymphatics: Non tender without lymphadenopathy.  Musculoskeletal: Full ROM, 5/5 strength, normal gait.  Skin: Warm, dry without rashes, lesions, ecchymosis.  Neuro: Cranial nerves intact. Normal muscle tone, no cerebellar symptoms. Sensation intact.  Psych: Awake and oriented X 3, normal affect, Insight and Judgment appropriate.     Vicie Mutters, PA-C 5:29 PM [facility]

## 2016-11-10 NOTE — Patient Instructions (Addendum)
Alendronate/fosamax STOP this med, will get bone density  Continue 325mg  aspirin  Continue propanolol and simvastatin  If anymore fast/abnormal heart beats we will send you for a holter monitor or loop recorder  You can use atrovent nasal spray as needed   Start back on zoloft 100mg  1/2 start on 1/2 daily  Transient Ischemic Attack A transient ischemic attack (TIA) is a "warning stroke" that causes stroke-like symptoms. Unlike a stroke, a TIA does not cause permanent damage to the brain. The symptoms of a TIA can happen very fast and do not last long. It is important to know the symptoms of a TIA and what to do. This can help prevent a major stroke or death. What are the causes? A TIA is caused by a temporary blockage in an artery in the brain or neck (carotid artery). The blockage does not allow the brain to get the blood supply it needs and can cause different symptoms. The blockage can be caused by either:  A blood clot.  Fatty buildup (plaque) in a neck or brain artery.  What increases the risk?  High blood pressure (hypertension).  High cholesterol.  Diabetes mellitus.  Heart disease.  The buildup of plaque in the blood vessels (peripheral artery disease or atherosclerosis).  The buildup of plaque in the blood vessels that provide blood and oxygen to the brain (carotid artery stenosis).  An abnormal heart rhythm (atrial fibrillation).  Obesity.  Using any tobacco products, including cigarettes, chewing tobacco, or electronic cigarettes.  Taking oral contraceptives, especially in combination with using tobacco.  Physical inactivity.  A diet high in fats, salt (sodium), and calories.  Excessive alcohol use.  Use of illegal drugs (especially cocaine and methamphetamine).  Being male.  Being African American.  Being over the age of 33 years.  Family history of stroke.  Previous history of blood clots, stroke, TIA, or heart attack.  Sickle cell  disease. What are the signs or symptoms? TIA symptoms are the same as a stroke but are temporary. These symptoms usually develop suddenly, or may be newly present upon waking from sleep:  Sudden weakness or numbness of the face, arm, or leg, especially on one side of the body.  Sudden trouble walking or difficulty moving arms or legs.  Sudden confusion.  Sudden personality changes.  Trouble speaking (aphasia) or understanding.  Difficulty swallowing.  Sudden trouble seeing in one or both eyes.  Double vision.  Dizziness.  Loss of balance or coordination.  Sudden severe headache with no known cause.  Trouble reading or writing.  Loss of bowel or bladder control.  Loss of consciousness.  How is this diagnosed? Your health care provider may be able to determine the presence or absence of a TIA based on your symptoms, history, and physical exam. CT scan of the brain is usually performed to help identify a TIA. Other tests may include:  Electrocardiography (ECG).  Continuous heart monitoring.  Echocardiography.  Carotid ultrasonography.  MRI.  A scan of the brain circulation.  Blood tests.  How is this treated? Since the symptoms of TIA are the same as a stroke, it is important to seek treatment as soon as possible. You may need a medicine to dissolve a blood clot (thrombolytic) if that is the cause of the TIA. This medicine cannot be given if too much time has passed. Treatment may also include:  Rest, oxygen, fluids through an IV tube, and medicines to thin the blood (anticoagulants).  Measures will be taken to  prevent short-term and long-term complications, including infection from breathing foreign material into the lungs (aspiration pneumonia), blood clots in the legs, and falls.  Procedures to either remove plaque in the carotid arteries or dilate carotid arteries that have narrowed due to plaque. Those procedures are: ? Carotid endarterectomy. ? Carotid  angioplasty and stenting.  Medicines and diet may be used to address diabetes, high blood pressure, and other underlying risk factors.  Follow these instructions at home:  Take medicines only as directed by your health care provider. Follow the directions carefully. Medicines may be used to control risk factors for a stroke. Be sure you understand all your medicine instructions.  You may be told to take aspirin or the anticoagulant warfarin. Warfarin needs to be taken exactly as instructed. ? Taking too much or too little warfarin is dangerous. Too much warfarin increases the risk of bleeding. Too little warfarin continues to allow the risk for blood clots. While taking warfarin, you will need to have regular blood tests to measure your blood clotting time. A PT blood test measures how long it takes for blood to clot. Your PT is used to calculate another value called an INR. Your PT and INR help your health care provider to adjust your dose of warfarin. The dose can change for many reasons. It is critically important that you take warfarin exactly as prescribed. ? Many foods, especially foods high in vitamin K can interfere with warfarin and affect the PT and INR. Foods high in vitamin K include spinach, kale, broccoli, cabbage, collard and turnip greens, Brussels sprouts, peas, cauliflower, seaweed, and parsley, as well as beef and pork liver, green tea, and soybean oil. You should eat a consistent amount of foods high in vitamin K. Avoid major changes in your diet, or notify your health care provider before changing your diet. Arrange a visit with a dietitian to answer your questions. ? Many medicines can interfere with warfarin and affect the PT and INR. You must tell your health care provider about any and all medicines you take; this includes all vitamins and supplements. Be especially cautious with aspirin and anti-inflammatory medicines. Do not take or discontinue any prescribed or over-the-counter  medicine except on the advice of your health care provider or pharmacist. ? Warfarin can have side effects, such as excessive bruising or bleeding. You will need to hold pressure over cuts for longer than usual. Your health care provider or pharmacist will discuss other potential side effects. ? Avoid sports or activities that may cause injury or bleeding. ? Be careful when shaving, flossing your teeth, or handling sharp objects. ? Alcohol can change the body's ability to handle warfarin. It is best to avoid alcoholic drinks or consume only very small amounts while taking warfarin. Notify your health care provider if you change your alcohol intake. ? Notify your dentist or other health care providers before procedures.  Eat a diet that includes 5 or more servings of fruits and vegetables each day. This may reduce the risk of stroke. Certain diets may be prescribed to address high blood pressure, high cholesterol, diabetes, or obesity. ? A diet low in sodium, saturated fat, trans fat, and cholesterol is recommended to manage high blood pressure. ? A diet low in saturated fat, trans fat, and cholesterol, and high in fiber may control cholesterol levels. ? A controlled-carbohydrate, controlled-sugar diet is recommended to manage diabetes. ? A reduced-calorie diet that is low in sodium, saturated fat, trans fat, and cholesterol is recommended to  manage obesity.  Maintain a healthy weight.  Stay physically active. It is recommended that you get at least 30 minutes of activity on most or all days.  Do not use any tobacco products, including cigarettes, chewing tobacco, or electronic cigarettes. If you need help quitting, ask your health care provider.  Limit alcohol intake to no more than 1 drink per day for nonpregnant women and 2 drinks per day for men. One drink equals 12 ounces of beer, 5 ounces of wine, or 1 ounces of hard liquor.  Do not abuse drugs.  A safe home environment is important to  reduce the risk of falls. Your health care provider may arrange for specialists to evaluate your home. Having grab bars in the bedroom and bathroom is often important. Your health care provider may arrange for equipment to be used at home, such as raised toilets and a seat for the shower.  Follow all instructions for follow-up with your health care provider. This is very important. This includes any referrals and lab tests. Proper follow-up can prevent a stroke or another TIA from occurring. How is this prevented? The risk of a TIA can be decreased by appropriately treating high blood pressure, high cholesterol, diabetes, heart disease, and obesity, and by quitting smoking, limiting alcohol, and staying physically active. Contact a health care provider if:  You have personality changes.  You have difficulty swallowing.  You are seeing double.  You have dizziness.  You have a fever. Get help right away if: Any of the following symptoms may represent a serious problem that is an emergency. Do not wait to see if the symptoms will go away. Get medical help right away. Call your local emergency services (911 in [place] Do not drive yourself to the hospital.  You have sudden weakness or numbness of the face, arm, or leg, especially on one side of the body.  You have sudden trouble walking or difficulty moving arms or legs.  You have sudden confusion.  You have trouble speaking (aphasia) or understanding.  You have sudden trouble seeing in one or both eyes.  You have a loss of balance or coordination.  You have a sudden, severe headache with no known cause.  You have new chest pain or an irregular heartbeat.  You have a partial or total loss of consciousness.  This information is not intended to replace advice given to you by your health care provider. Make sure you discuss any questions you have with your health care provider. Document Released: 03/09/2005 Document Revised: 02/01/2016  Document Reviewed: 09/04/2013 Elsevier Interactive Patient Education  2017 [employer]   Vascular Dementia Dementia is a condition in which a person has problems with thinking, memory, and behavior that are severe enough to interfere with daily life. Vascular dementia is a type of dementia. It results from brain damage that is caused by the brain not getting enough blood. Vascular dementia usually begins between 61 and 27 years of age. What are the causes? Vascular dementia is caused by conditions that lessen blood flow to the brain. Common causes include:  Multiple small strokes. These may happen without symptoms (silent stroke).  Major stroke.  Damage to small blood vessels in the brain (cerebral small vessel disease).  What increases the risk?  Advancing age.  Having had a stroke.  Having high blood pressure (hypertension) or high cholesterol.  Having a disease that affects the heart or blood vessels.  Smoking.  Having diabetes.  Being male.  Being obese.  Not being active.  Having depression. What are the signs or symptoms? Symptoms can vary a lot from one person to another. Symptoms may be mild or severe depending on the amount of damage and which parts of the brain have been affected. Symptoms may begin suddenly or may develop gradually. Symptoms may remain stable, or they may get worse over time. Symptoms of vascular dementia may be similar to those of Alzheimer disease. The two conditions can occur together (mixed dementia). Symptoms of vascular dementia may include: Mental  Confusion.  Memory problems.  Poor attention and concentration.  Trouble understanding speech.  Depression.  Personality changes.  Trouble recognizing familiar people.  Agitation or aggression.  Paranoia.  Delusions or hallucinations. Physical  Weakness.  Poor balance.  Loss of bladder or bowel control (incontinence).  Unsteady walking (gait).  Speaking  problems. Behavioral  Getting lost in familiar places.  Problems with planning and judgment.  Trouble following instructions.  Social problems.  Emotional outbursts.  Trouble with daily activities and self-care.  Problems handling money. How is this diagnosed? There is not a specific test to diagnose vascular dementia. The health care provider will consider the person's medical history and symptoms or changes that are reported by friends and family. The health care provider will do a physical exam and may order lab tests or other tests that check brain and nervous system function. Tests that may be done include:  Blood tests.  Brain imaging tests.  Tests of movement, speech, and other daily activities (neurological exam).  Tests of memory, thinking, and problem-solving (neuropsychological or neurocognitive testing).  Diagnosis may involve several specialists. These may include a health care provider who specializes in the brain and nervous system (neurologist), a provider who specializes in disorders of the mind (psychiatrist), and a provider who focuses on speech and language changes (Electrical engineer). How is this treated? There is no cure for vascular dementia. Brain damage that has already occurred cannot be reversed. Treatment depends on:  How severe the condition is.  Which parts of the brain have been affected.  The person's overall health.  Treatment measures aim to:  Treat the underlying cause of vascular dementia and manage risk factors. This may include: ? Controlling blood pressure. ? Lowering cholesterol. ? Treating diabetes. ? Quitting smoking. ? Losing weight.  Manage symptoms.  Prevent further brain damage.  Improve the person's health and quality of life.  Treatment for dementia may involve a team of health care providers, including:  A neurologist.  A psychiatrist.  An occupational therapist.  A speech pathologist.  A  cardiologist.  An exercise physiologist or physical therapist.  Follow these instructions at home: Home care for a person with vascular dementia depends on what caused the condition and how severe the symptoms are. General guidelines for care at home include:  Following the health care provider's instructions for treating the condition that caused the dementia.  Using medicines only as told by the person's health care provider.  Creating a safe living space.  Learning ways to help the person remember people, appointments, and daily activities.  Finding a support group to help caregivers and family to cope with the effects of dementia.  Helping family and friends learn about ways to communicate with a person who has dementia.  Making sure the person keeps all follow-up visits and goes to all rehabilitation appointments as told by the health care team. This is important.  Contact a health care provider if:  A fever develops.  New behavioral problems develop.  Problems with swallowing develop.  Confusion gets worse.  Sleepiness gets worse. Get help right away if:  Loss of consciousness occurs.  There is a sudden loss of speech, balance, or thinking ability.  New numbness or paralysis occurs.  Sudden, severe headache occurs.  Vision is lost or suddenly gets worse in one or both eyes. This information is not intended to replace advice given to you by your health care provider. Make sure you discuss any questions you have with your health care provider. Document Released: 05/20/2002 Document Revised: 11/05/2015 Document Reviewed: 09/10/2014 Elsevier Interactive Patient Education  2018 [employer].

## 2016-11-11 LAB — CBC WITH DIFFERENTIAL/PLATELET
BASOS PCT: 0 %
Basophils Absolute: 0 cells/uL (ref 0–200)
EOS PCT: 2 %
Eosinophils Absolute: 130 cells/uL (ref 15–500)
HCT: 38.3 % — ABNORMAL LOW (ref 38.5–50.0)
Hemoglobin: 12.5 g/dL — ABNORMAL LOW (ref 13.2–17.1)
LYMPHS PCT: 16 %
Lymphs Abs: 1040 cells/uL (ref 850–3900)
MCH: 31.7 pg (ref 27.0–33.0)
MCHC: 32.6 g/dL (ref 32.0–36.0)
MCV: 97.2 fL (ref 80.0–100.0)
MONO ABS: 520 {cells}/uL (ref 200–950)
MONOS PCT: 8 %
MPV: 10.9 fL (ref 7.5–12.5)
Neutro Abs: 4810 cells/uL (ref 1500–7800)
Neutrophils Relative %: 74 %
PLATELETS: 151 10*3/uL (ref 140–400)
RBC: 3.94 MIL/uL — AB (ref 4.20–5.80)
RDW: 13.3 % (ref 11.0–15.0)
WBC: 6.5 10*3/uL (ref 3.8–10.8)

## 2016-11-11 LAB — HEPATIC FUNCTION PANEL
ALK PHOS: 57 U/L (ref 40–115)
ALT: 9 U/L (ref 9–46)
AST: 14 U/L (ref 10–35)
Albumin: 4.1 g/dL (ref 3.6–5.1)
BILIRUBIN DIRECT: 0.1 mg/dL (ref <=0.2)
BILIRUBIN INDIRECT: 0.4 mg/dL (ref 0.2–1.2)
BILIRUBIN TOTAL: 0.5 mg/dL (ref 0.2–1.2)
TOTAL PROTEIN: 6.2 g/dL (ref 6.1–8.1)

## 2016-11-11 LAB — BASIC METABOLIC PANEL WITH GFR
BUN: 22 mg/dL (ref 7–25)
CALCIUM: 9.3 mg/dL (ref 8.6–10.3)
CO2: 26 mmol/L (ref 20–31)
Chloride: 106 mmol/L (ref 98–110)
Creat: 1.48 mg/dL — ABNORMAL HIGH (ref 0.70–1.11)
GFR, EST AFRICAN AMERICAN: 49 mL/min — AB (ref >=60)
GFR, EST NON AFRICAN AMERICAN: 43 mL/min — AB (ref >=60)
GLUCOSE: 92 mg/dL (ref 65–99)
Potassium: 4.4 mmol/L (ref 3.5–5.3)
SODIUM: 144 mmol/L (ref 135–146)

## 2016-11-11 NOTE — Progress Notes (Signed)
Pt aware of lab results & voiced understanding of those results.

## 2016-11-18 DIAGNOSIS — M25561 Pain in right knee: Secondary | ICD-10-CM | POA: Diagnosis not present

## 2017-02-14 NOTE — Progress Notes (Signed)
MEDICARE ANNUAL WELLNESS VISIT AND FOLLOW UP Assessment:    Essential hypertension, benign -well controlled -monitor at home -dash diet -exercise as tolerated - TSH   Atherosclerosis of native coronary artery of native heart without angina pectoris -cont dash diet -d/c statin due to age  MCI (mild cognitive impairment) -cont exelon -followed by neurology   Hereditary and idiopathic peripheral neuropathy -followed by neurology   Benign essential tremor -cont propranolol   Primary osteoarthritis of right knee -weight control -tylenol prn   Bladder neck obstruction -followed by urology   Malignant neoplasm of prostate ([place]) -followed by urology  Mixed hyperlipidemia D/c statin - Lipid panel  INTERMITTENT VERTIGO -cont meds prn  Vitamin D deficiency -cont Vit D   Medication management - CBC with Differential/Platelet - BASIC METABOLIC PANEL WITH GFR - Hepatic function panel  Imbalance Will do PT due to fall risk/decreased strength  Advanced care counseling/discussion Discussed advanced care planning with patient  Medicare visits 1 year Over 30 minutes of exam, counseling, chart review, and critical decision making was performed  Future Appointments Date Time Provider [place]  05/23/2017 10:00 AM Unk Pinto, MD [facility] None  08/21/2017 9:30 AM Alda Berthold, DO [facility] None     Plan:   During the course of the visit the patient was educated and counseled about appropriate screening and preventive services including:    Pneumococcal vaccine   Influenza vaccine  Prevnar 13  Td vaccine  Screening electrocardiogram  Colorectal cancer screening  Diabetes screening  Glaucoma screening  Nutrition counseling    Subjective:  Ryan Humphrey is a 83 y.o. male who presents for Medicare Annual Wellness Visit and 3 month follow up for HTN, hyperlipidemia, prediabetes, and vitamin D Def.   His blood pressure has been  controlled at home, today their BP is BP: 124/68 He does not workout. He denies chest pain, shortness of breath, dizziness.  He had possible TIA in May, now on 325mg  ASA, He had normal echo, normal carotid dopplers. He has had 2 falls, states his legs are not as strong and  He has a history of prostate cancer s/p surgery in 2014.   He is on cholesterol medication and denies myalgias. His cholesterol is at goal. The cholesterol last visit was:   Lab Results  Component Value Date   CHOL 123 11/05/2016   HDL 36 (L) 11/05/2016   LDLCALC 61 11/05/2016   TRIG 128 11/05/2016   CHOLHDL 3.4 11/05/2016   Patient does not have a history of diabetes or prediabetes, however he has neuropathy and is on gabapentin.  He continue to eat a healthy diet.   Lab Results  Component Value Date   HGBA1C 5.3 11/05/2016   Last GFR Lab Results  Component Value Date   GFRNONAA 43 (L) 11/10/2016    Patient is on Vitamin D supplement.   Lab Results  Component Value Date   VD25OH 44 04/22/2016     BMI is Body mass index is 25.45 kg/m., he is working on diet and exercise. Wt Readings from Last 3 Encounters:  02/15/17 177 lb 6.4 oz (80.5 kg)  11/10/16 172 lb 9.6 oz (78.3 kg)  11/05/16 168 lb (76.2 kg)     Medication Review: Current Outpatient Prescriptions on File Prior to Visit  Medication Sig Dispense Refill  . aspirin EC 325 MG EC tablet Take 1 tablet (325 mg total) by mouth at bedtime. 30 tablet 0  . Calcium Carbonate-Vitamin D (CALTRATE 600+D PO) Take 1  tablet by mouth daily.     . Cholecalciferol (VITAMIN D) 2000 UNITS tablet Take 8,000 Units by mouth daily.    . diphenoxylate-atropine (LOMOTIL) 2.5-0.025 MG tablet TAKE ONE TABLET BY MOUTH 4 TIMES DAILY AS NEEDED FOR DIARRHEA OR  LOOSE  STOOLS 90 tablet 1  . gabapentin (NEURONTIN) 400 MG capsule Take 400 mg by mouth 3 (three) times daily.    Marland Kitchen ipratropium (ATROVENT) 0.03 % nasal spray Place 2 sprays into the nose 3 (three) times daily. 30 mL 2  .  propranolol (INDERAL) 20 MG tablet Take 1 tablet (20 mg total) by mouth 2 (two) times daily. 180 tablet 3  . simvastatin (ZOCOR) 20 MG tablet Take 1 tablet (20 mg total) by mouth every evening. 90 tablet 3   No current facility-administered medications on file prior to visit.     Allergies: Allergies  Allergen Reactions  . Niacin Itching  . Penicillins Other (See Comments)    "rash"  . Tramadol Itching    Current Problems (verified) has Mixed hyperlipidemia; Essential hypertension, benign; CORONARY ATHEROSCLEROSIS NATIVE CORONARY ARTERY; INTERMITTENT VERTIGO; Primary osteoarthritis of right knee; Vitamin D deficiency; Prediabetes; Medication management; MCI (mild cognitive impairment); Hereditary and idiopathic peripheral neuropathy; Benign essential tremor; Bladder neck obstruction; Malignant neoplasm of prostate ([place]); TIA (transient ischemic attack); Vitamin B12 deficiency; and GERD (gastroesophageal reflux disease) on his problem list.  Screening Tests Immunization History  Administered Date(s) Administered  . Influenza, High Dose Seasonal PF 03/05/2015  . Influenza-Unspecified 04/22/2016  . Pneumococcal Polysaccharide-23 01/20/2016    Preventative care: Last colonoscopy: 2014  Prior vaccinations: TD or Tdap: Declined Due to cost Influenza: Due in September  Pneumococcal: 2016 Prevnar13: Today Shingles/Zostavax: Declined  Names of Other Physician/Practitioners you currently use: 1. [facility] here for primary care 2. [facility], eye doctor, last visit 2016 3. [facility], dentist, last visit 2017 Patient Care Team: Unk Pinto, MD as PCP - General (Internal Medicine)  Surgical: He  has a past surgical history that includes Appendectomy; Cholecystectomy; Carpal tunnel release; Prostate surgery (12-06-12); cataract surgery (Left, 12-06-12); Esophagogastroduodenoscopy (egd) with propofol (N/A, 12/25/2012);  Colonoscopy with propofol (N/A, 12/25/2012); Tonsillectomy; and Total knee arthroplasty (Right, 11/25/2014). Family His family history includes CAD in his father; Dementia in his brother and mother; Diabetes in his brother; Hyperlipidemia in his brother; Hypertension in his brother. Social history  He reports that he quit smoking about 53 years ago. He has a 15.00 pack-year smoking history. He has never used smokeless tobacco. He reports that he drinks alcohol. He reports that he does not use drugs.  MEDICARE WELLNESS OBJECTIVES: Physical activity: Current Exercise Habits: Home exercise routine, Intensity: Mild Cardiac risk factors: Cardiac Risk Factors include: advanced age (>85men, >21 women);dyslipidemia;hypertension;male gender;sedentary lifestyle Depression/mood screen:   Depression screen [facility] 2/9 04/22/2016  Decreased Interest 0  Down, Depressed, Hopeless 0  PHQ - 2 Score 0    ADLs:  In your present state of health, do you have any difficulty performing the following activities: 02/15/2017 11/05/2016  Hearing? N N  Vision? N N  Difficulty concentrating or making decisions? Tempie Donning  Walking or climbing stairs? Y N  Comment will walk with a cane and will be slower -  Dressing or bathing? N N  Doing errands, shopping? N N  Preparing Food and eating ? N -  Using the Toilet? N -  In the past six months, have you accidently leaked urine? N -  Do you have  problems with loss of bowel control? Y -  Managing your Medications? N -  Managing your Finances? N -  Housekeeping or managing your Housekeeping? N -  Some recent data might be hidden    Archivist neuro  EOL planning: Does Patient Have a Medical Advance Directive?: Yes Type of Advance Directive: Healthcare Power of Attorney, Living will Does patient want to make changes to medical advance directive?: No - Patient declined Copy of [place] in Chart?: No - copy requested   Objective:   Today's  Vitals   02/15/17 1538  BP: 124/68  Pulse: 86  Resp: 14  Temp: (!) 97.5 F (36.4 C)  SpO2: 97%  Weight: 177 lb 6.4 oz (80.5 kg)  Height: 5\' 10"  (1.778 m)   Body mass index is 25.45 kg/m.  General appearance: alert, no distress, WD/WN, male HEENT: normocephalic, sclerae anicteric, TMs pearly, nares patent, no discharge or erythema, pharynx normal Oral cavity: MMM, no lesions Neck: supple, no lymphadenopathy, no thyromegaly, no masses Heart: RRR, normal S1, S2, no murmurs Lungs: CTA bilaterally, no wheezes, rhonchi, or rales Abdomen: +bs, soft, non tender, non distended, no masses, no hepatomegaly, no splenomegaly Musculoskeletal: nontender, no swelling, no obvious deformity Extremities: no edema, no cyanosis, no clubbing Pulses: 2+ symmetric, upper and lower extremities, normal cap refill Neurological: alert, oriented x 3, CN2-12 intact, strength normal upper extremities and decreased strength bilateral lower extremities, sensation normal throughout, DTRs 2+ throughout, no cerebellar signs, gait slow and antalgic Psychiatric: normal affect, behavior normal, pleasant   Medicare Attestation I have personally reviewed: The patient's medical and social history Their use of alcohol, tobacco or illicit drugs Their current medications and supplements The patient's functional ability including ADLs,fall risks, home safety risks, cognitive, and hearing and visual impairment Diet and physical activities Evidence for depression or mood disorders  The patient's weight, height, BMI, and visual acuity have been recorded in the chart.  I have made referrals, counseling, and provided education to the patient based on review of the above and I have provided the patient with a written personalized care plan for preventive services.     Vicie Mutters, PA-C   02/15/2017

## 2017-02-15 ENCOUNTER — Ambulatory Visit (INDEPENDENT_AMBULATORY_CARE_PROVIDER_SITE_OTHER): Payer: Medicare Other | Admitting: Physician Assistant

## 2017-02-15 ENCOUNTER — Encounter: Payer: Self-pay | Admitting: Physician Assistant

## 2017-02-15 VITALS — BP 124/68 | HR 86 | Temp 97.5°F | Resp 14 | Ht 70.0 in | Wt 177.4 lb

## 2017-02-15 DIAGNOSIS — R42 Dizziness and giddiness: Secondary | ICD-10-CM

## 2017-02-15 DIAGNOSIS — I10 Essential (primary) hypertension: Secondary | ICD-10-CM | POA: Diagnosis not present

## 2017-02-15 DIAGNOSIS — R2689 Other abnormalities of gait and mobility: Secondary | ICD-10-CM

## 2017-02-15 DIAGNOSIS — G459 Transient cerebral ischemic attack, unspecified: Secondary | ICD-10-CM | POA: Diagnosis not present

## 2017-02-15 DIAGNOSIS — E559 Vitamin D deficiency, unspecified: Secondary | ICD-10-CM | POA: Diagnosis not present

## 2017-02-15 DIAGNOSIS — K219 Gastro-esophageal reflux disease without esophagitis: Secondary | ICD-10-CM | POA: Diagnosis not present

## 2017-02-15 DIAGNOSIS — M1711 Unilateral primary osteoarthritis, right knee: Secondary | ICD-10-CM | POA: Diagnosis not present

## 2017-02-15 DIAGNOSIS — G250 Essential tremor: Secondary | ICD-10-CM

## 2017-02-15 DIAGNOSIS — I2510 Atherosclerotic heart disease of native coronary artery without angina pectoris: Secondary | ICD-10-CM | POA: Diagnosis not present

## 2017-02-15 DIAGNOSIS — Z0000 Encounter for general adult medical examination without abnormal findings: Secondary | ICD-10-CM

## 2017-02-15 DIAGNOSIS — Z79899 Other long term (current) drug therapy: Secondary | ICD-10-CM | POA: Diagnosis not present

## 2017-02-15 DIAGNOSIS — R6889 Other general symptoms and signs: Secondary | ICD-10-CM

## 2017-02-15 DIAGNOSIS — G609 Hereditary and idiopathic neuropathy, unspecified: Secondary | ICD-10-CM | POA: Diagnosis not present

## 2017-02-15 DIAGNOSIS — R7303 Prediabetes: Secondary | ICD-10-CM

## 2017-02-15 DIAGNOSIS — F3341 Major depressive disorder, recurrent, in partial remission: Secondary | ICD-10-CM

## 2017-02-15 DIAGNOSIS — E782 Mixed hyperlipidemia: Secondary | ICD-10-CM | POA: Diagnosis not present

## 2017-02-15 DIAGNOSIS — E538 Deficiency of other specified B group vitamins: Secondary | ICD-10-CM

## 2017-02-15 DIAGNOSIS — Z0001 Encounter for general adult medical examination with abnormal findings: Secondary | ICD-10-CM | POA: Diagnosis not present

## 2017-02-15 DIAGNOSIS — N320 Bladder-neck obstruction: Secondary | ICD-10-CM | POA: Diagnosis not present

## 2017-02-15 DIAGNOSIS — Z7189 Other specified counseling: Secondary | ICD-10-CM

## 2017-02-15 DIAGNOSIS — G3184 Mild cognitive impairment, so stated: Secondary | ICD-10-CM

## 2017-02-15 MED ORDER — SERTRALINE HCL 100 MG PO TABS: ORAL_TABLET | ORAL | 1 refills | Status: DC

## 2017-02-15 NOTE — Patient Instructions (Addendum)
Add ENTERIC COATED low dose 325mg mg Aspirin to help prevent another stroke. . As well as to reduce risk of Colon Cancer by 20 %, Skin Cancer by 26 % , Melanoma by 46% and Pancreatic cancer by 60%  [employer] with no obligation # [phone] Do not have to be a member Tues-Sat 10-6  Adrian- free test with no obligation # [phone] MUST BE A MEMBER Call for store hours  Have had patient's get good cheaper hearing aids from [employer] The air version has good reviews.   Management of Memory Problems  There are some general things you can do to help manage your memory problems.  Your memory may not in fact recover, but by using techniques and strategies you will be able to manage your memory difficulties better.  1)  Establish a routine.  Try to establish and then stick to a regular routine.  By doing this, you will get used to what to expect and you will reduce the need to rely on your memory.  Also, try to do things at the same time of day, such as taking your medication or checking your calendar first thing in the morning.  Think about think that you can do as a part of a regular routine and make a list.  Then enter them into a daily planner to remind you.  This will help you establish a routine.  2)  Organize your environment.  Organize your environment so that it is uncluttered.  Decrease visual stimulation.  Place everyday items such as keys or cell phone in the same place every day (ie.  Basket next to front door)  Use post it notes with a brief message to yourself (ie. Turn off light, lock the door)  Use labels to indicate where things go (ie. Which cupboards are for food, dishes, etc.)  Keep a notepad and pen by the telephone to take messages  3)  Memory Aids  A diary or journal/notebook/daily planner  Making a list (shopping list, chore list, to do list that needs to be done)  Using an alarm as a reminder (kitchen timer or cell  phone alarm)  Using cell phone to store information (Notes, Calendar, Reminders)  Calendar/White board placed in a prominent position  Post-it notes  In order for memory aids to be useful, you need to have good habits.  It's no good remembering to make a note in your journal if you don't remember to look in it.  Try setting aside a certain time of day to look in journal.  4)  Improving mood and managing fatigue.  There may be other factors that contribute to memory difficulties.  Factors, such as anxiety, depression and tiredness can affect memory.  Regular gentle exercise can help improve your mood and give you more energy.  Simple relaxation techniques may help relieve symptoms of anxiety  Try to get back to completing activities or hobbies you enjoyed doing in the past.  Learn to pace yourself through activities to decrease fatigue.  Find out about some local support groups where you can share experiences with others.  Try and achieve 7-8 hours of sleep at night.

## 2017-02-16 LAB — LIPID PANEL
CHOLESTEROL: 179 mg/dL (ref <200)
HDL: 37 mg/dL — ABNORMAL LOW (ref >40)
LDL Cholesterol (Calc): 117 mg/dL (calc) — ABNORMAL HIGH
Non-HDL Cholesterol (Calc): 142 mg/dL (calc) — ABNORMAL HIGH (ref <130)
Total CHOL/HDL Ratio: 4.8 (calc) (ref <5.0)
Triglycerides: 137 mg/dL (ref <150)

## 2017-02-16 LAB — BASIC METABOLIC PANEL WITH GFR
BUN/Creatinine Ratio: 16 (calc) (ref 6–22)
BUN: 22 mg/dL (ref 7–25)
CALCIUM: 9.8 mg/dL (ref 8.6–10.3)
CHLORIDE: 105 mmol/L (ref 98–110)
CO2: 29 mmol/L (ref 20–32)
Creat: 1.35 mg/dL — ABNORMAL HIGH (ref 0.70–1.11)
GFR, EST AFRICAN AMERICAN: 55 mL/min/{1.73_m2} — AB (ref >=60)
GFR, Est Non African American: 48 mL/min/{1.73_m2} — ABNORMAL LOW (ref >=60)
Glucose, Bld: 85 mg/dL (ref 65–99)
POTASSIUM: 4.7 mmol/L (ref 3.5–5.3)
Sodium: 145 mmol/L (ref 135–146)

## 2017-02-16 LAB — HEPATIC FUNCTION PANEL
AG Ratio: 2 (calc) (ref 1.0–2.5)
ALBUMIN MSPROF: 4.3 g/dL (ref 3.6–5.1)
ALT: 7 U/L — ABNORMAL LOW (ref 9–46)
AST: 11 U/L (ref 10–35)
Alkaline phosphatase (APISO): 62 U/L (ref 40–115)
BILIRUBIN INDIRECT: 0.5 mg/dL (ref 0.2–1.2)
Bilirubin, Direct: 0.1 mg/dL (ref 0.0–0.2)
GLOBULIN: 2.2 g/dL (ref 1.9–3.7)
TOTAL PROTEIN: 6.5 g/dL (ref 6.1–8.1)
Total Bilirubin: 0.6 mg/dL (ref 0.2–1.2)

## 2017-02-16 LAB — CBC WITH DIFFERENTIAL/PLATELET
BASOS PCT: 0.2 %
Basophils Absolute: 16 cells/uL (ref 0–200)
EOS PCT: 1.3 %
Eosinophils Absolute: 107 cells/uL (ref 15–500)
HCT: 39.4 % (ref 38.5–50.0)
Hemoglobin: 13.7 g/dL (ref 13.2–17.1)
LYMPHS ABS: 1189 {cells}/uL (ref 850–3900)
MCH: 32.9 pg (ref 27.0–33.0)
MCHC: 34.8 g/dL (ref 32.0–36.0)
MCV: 94.5 fL (ref 80.0–100.0)
MPV: 11 fL (ref 7.5–12.5)
Monocytes Relative: 9.5 %
NEUTROS PCT: 74.5 %
Neutro Abs: 6109 cells/uL (ref 1500–7800)
PLATELETS: 138 10*3/uL — AB (ref 140–400)
RBC: 4.17 10*6/uL — ABNORMAL LOW (ref 4.20–5.80)
RDW: 12.4 % (ref 11.0–15.0)
TOTAL LYMPHOCYTE: 14.5 %
WBC: 8.2 10*3/uL (ref 3.8–10.8)
WBCMIX: 779 {cells}/uL (ref 200–950)

## 2017-02-16 LAB — MAGNESIUM: MAGNESIUM: 2.1 mg/dL (ref 1.5–2.5)

## 2017-02-16 LAB — TSH: TSH: 3.98 mIU/L (ref 0.40–4.50)

## 2017-02-16 NOTE — Progress Notes (Signed)
Pt aware of lab results & voiced understanding of those results.

## 2017-02-21 DIAGNOSIS — L821 Other seborrheic keratosis: Secondary | ICD-10-CM | POA: Diagnosis not present

## 2017-02-21 DIAGNOSIS — Z85828 Personal history of other malignant neoplasm of skin: Secondary | ICD-10-CM | POA: Diagnosis not present

## 2017-02-21 DIAGNOSIS — D485 Neoplasm of uncertain behavior of skin: Secondary | ICD-10-CM | POA: Diagnosis not present

## 2017-02-21 DIAGNOSIS — L570 Actinic keratosis: Secondary | ICD-10-CM | POA: Diagnosis not present

## 2017-02-21 DIAGNOSIS — D1801 Hemangioma of skin and subcutaneous tissue: Secondary | ICD-10-CM | POA: Diagnosis not present

## 2017-02-21 DIAGNOSIS — D225 Melanocytic nevi of trunk: Secondary | ICD-10-CM | POA: Diagnosis not present

## 2017-02-21 DIAGNOSIS — C4441 Basal cell carcinoma of skin of scalp and neck: Secondary | ICD-10-CM | POA: Diagnosis not present

## 2017-03-03 DIAGNOSIS — M25561 Pain in right knee: Secondary | ICD-10-CM | POA: Diagnosis not present

## 2017-03-03 DIAGNOSIS — M6281 Muscle weakness (generalized): Secondary | ICD-10-CM | POA: Diagnosis not present

## 2017-03-03 DIAGNOSIS — R2681 Unsteadiness on feet: Secondary | ICD-10-CM | POA: Diagnosis not present

## 2017-03-03 DIAGNOSIS — R262 Difficulty in walking, not elsewhere classified: Secondary | ICD-10-CM | POA: Diagnosis not present

## 2017-03-07 DIAGNOSIS — M25561 Pain in right knee: Secondary | ICD-10-CM | POA: Diagnosis not present

## 2017-03-07 DIAGNOSIS — M6281 Muscle weakness (generalized): Secondary | ICD-10-CM | POA: Diagnosis not present

## 2017-03-07 DIAGNOSIS — R262 Difficulty in walking, not elsewhere classified: Secondary | ICD-10-CM | POA: Diagnosis not present

## 2017-03-07 DIAGNOSIS — R2681 Unsteadiness on feet: Secondary | ICD-10-CM | POA: Diagnosis not present

## 2017-03-09 DIAGNOSIS — R2681 Unsteadiness on feet: Secondary | ICD-10-CM | POA: Diagnosis not present

## 2017-03-09 DIAGNOSIS — R262 Difficulty in walking, not elsewhere classified: Secondary | ICD-10-CM | POA: Diagnosis not present

## 2017-03-09 DIAGNOSIS — M25561 Pain in right knee: Secondary | ICD-10-CM | POA: Diagnosis not present

## 2017-03-09 DIAGNOSIS — M6281 Muscle weakness (generalized): Secondary | ICD-10-CM | POA: Diagnosis not present

## 2017-03-14 DIAGNOSIS — M6281 Muscle weakness (generalized): Secondary | ICD-10-CM | POA: Diagnosis not present

## 2017-03-14 DIAGNOSIS — R2681 Unsteadiness on feet: Secondary | ICD-10-CM | POA: Diagnosis not present

## 2017-03-14 DIAGNOSIS — M25561 Pain in right knee: Secondary | ICD-10-CM | POA: Diagnosis not present

## 2017-03-14 DIAGNOSIS — R262 Difficulty in walking, not elsewhere classified: Secondary | ICD-10-CM | POA: Diagnosis not present

## 2017-03-21 DIAGNOSIS — R2681 Unsteadiness on feet: Secondary | ICD-10-CM | POA: Diagnosis not present

## 2017-03-21 DIAGNOSIS — R262 Difficulty in walking, not elsewhere classified: Secondary | ICD-10-CM | POA: Diagnosis not present

## 2017-03-21 DIAGNOSIS — M25561 Pain in right knee: Secondary | ICD-10-CM | POA: Diagnosis not present

## 2017-03-21 DIAGNOSIS — M6281 Muscle weakness (generalized): Secondary | ICD-10-CM | POA: Diagnosis not present

## 2017-03-23 DIAGNOSIS — R2681 Unsteadiness on feet: Secondary | ICD-10-CM | POA: Diagnosis not present

## 2017-03-23 DIAGNOSIS — M25561 Pain in right knee: Secondary | ICD-10-CM | POA: Diagnosis not present

## 2017-03-23 DIAGNOSIS — R262 Difficulty in walking, not elsewhere classified: Secondary | ICD-10-CM | POA: Diagnosis not present

## 2017-03-23 DIAGNOSIS — M6281 Muscle weakness (generalized): Secondary | ICD-10-CM | POA: Diagnosis not present

## 2017-03-28 DIAGNOSIS — M25561 Pain in right knee: Secondary | ICD-10-CM | POA: Diagnosis not present

## 2017-03-28 DIAGNOSIS — M6281 Muscle weakness (generalized): Secondary | ICD-10-CM | POA: Diagnosis not present

## 2017-03-28 DIAGNOSIS — R262 Difficulty in walking, not elsewhere classified: Secondary | ICD-10-CM | POA: Diagnosis not present

## 2017-03-28 DIAGNOSIS — R2681 Unsteadiness on feet: Secondary | ICD-10-CM | POA: Diagnosis not present

## 2017-03-30 ENCOUNTER — Encounter (HOSPITAL_COMMUNITY): Payer: Self-pay | Admitting: Emergency Medicine

## 2017-03-30 ENCOUNTER — Emergency Department (HOSPITAL_COMMUNITY)
Admission: EM | Admit: 2017-03-30 | Discharge: 2017-03-30 | Disposition: A | Discharge status: Home / Self Care | Payer: Medicare Other | Attending: Emergency Medicine | Admitting: Emergency Medicine

## 2017-03-30 DIAGNOSIS — I2510 Atherosclerotic heart disease of native coronary artery without angina pectoris: Secondary | ICD-10-CM | POA: Diagnosis not present

## 2017-03-30 DIAGNOSIS — Z87891 Personal history of nicotine dependence: Secondary | ICD-10-CM | POA: Insufficient documentation

## 2017-03-30 DIAGNOSIS — Z8673 Personal history of transient ischemic attack (TIA), and cerebral infarction without residual deficits: Secondary | ICD-10-CM | POA: Diagnosis not present

## 2017-03-30 DIAGNOSIS — E785 Hyperlipidemia, unspecified: Secondary | ICD-10-CM | POA: Diagnosis not present

## 2017-03-30 DIAGNOSIS — Z79899 Other long term (current) drug therapy: Secondary | ICD-10-CM | POA: Insufficient documentation

## 2017-03-30 DIAGNOSIS — R195 Other fecal abnormalities: Secondary | ICD-10-CM

## 2017-03-30 DIAGNOSIS — Z8546 Personal history of malignant neoplasm of prostate: Secondary | ICD-10-CM | POA: Diagnosis not present

## 2017-03-30 DIAGNOSIS — R9431 Abnormal electrocardiogram [ECG] [EKG]: Secondary | ICD-10-CM | POA: Diagnosis not present

## 2017-03-30 LAB — CBC
HEMATOCRIT: 42 % (ref 39.0–52.0)
HEMOGLOBIN: 14 g/dL (ref 13.0–17.0)
MCH: 31.8 pg (ref 26.0–34.0)
MCHC: 33.3 g/dL (ref 30.0–36.0)
MCV: 95.5 fL (ref 78.0–100.0)
PLATELETS: 124 10*3/uL — AB (ref 150–400)
RBC: 4.4 MIL/uL (ref 4.22–5.81)
RDW: 12.8 % (ref 11.5–15.5)
WBC: 7.2 10*3/uL (ref 4.0–10.5)

## 2017-03-30 LAB — TYPE AND SCREEN
ABO/RH(D): O POS
ANTIBODY SCREEN: NEGATIVE

## 2017-03-30 LAB — COMPREHENSIVE METABOLIC PANEL
ALBUMIN: 4 g/dL (ref 3.5–5.0)
ALT: 11 U/L — ABNORMAL LOW (ref 17–63)
ANION GAP: 8 (ref 5–15)
AST: 17 U/L (ref 15–41)
Alkaline Phosphatase: 95 U/L (ref 38–126)
BUN: 13 mg/dL (ref 6–20)
CO2: 28 mmol/L (ref 22–32)
Calcium: 9.1 mg/dL (ref 8.9–10.3)
Chloride: 106 mmol/L (ref 101–111)
Creatinine, Ser: 1.35 mg/dL — ABNORMAL HIGH (ref 0.61–1.24)
GFR calc Af Amer: 53 mL/min — ABNORMAL LOW (ref >60)
GFR calc non Af Amer: 46 mL/min — ABNORMAL LOW (ref >60)
GLUCOSE: 139 mg/dL — AB (ref 65–99)
POTASSIUM: 3.4 mmol/L — AB (ref 3.5–5.1)
SODIUM: 142 mmol/L (ref 135–145)
Total Bilirubin: 1.2 mg/dL (ref 0.3–1.2)
Total Protein: 6.4 g/dL — ABNORMAL LOW (ref 6.5–8.1)

## 2017-03-30 LAB — POC OCCULT BLOOD, ED: Fecal Occult Bld: POSITIVE — AB

## 2017-03-30 NOTE — Discharge Instructions (Signed)
Call Dr. Lynne Leader office today for follow-up as soon as possible. Return if return of dark stools, pain, or lightheaded.

## 2017-03-30 NOTE — ED Provider Notes (Signed)
[place] EMERGENCY DEPARTMENT Provider Note   CSN: [id number] Arrival date & time: 03/30/17  2947     History   Chief Complaint Chief Complaint  Patient presents with  . GI Bleeding    HPI Ryan Humphrey is a 82 y.o. male.  HPI  83 year old man is today stating that he had an explosive black bowel movement this morning. He states that he has been having problems with explosive bowel movements over the past several years and this occurs on a regular basis. However, today he noted that it was black. He did not note any blood or redness. He has not had any pain or lightheadedness. He is followed by Dr. Deberah Pelton and has last seen Dr. Fuller Plan for GI. The abdominal pain, chest pain, dyspnea, lightheadedness, nausea, vomiting, diarrhea, or urinary tract infection symptoms. He has not had any recent significant change in appetite or weight loss. He denies taking any medications that would cause change in his stool color and has not had any recent changes in his medications.  Past Medical History:  Diagnosis Date  . Anemia   . Arthritis    arthritis,osteopenia,"spinal stenosis"  . CAD (coronary artery disease)   . Cancer [facility]) 12-06-12   Prostate cancer'98  . Depression   . GERD (gastroesophageal reflux disease)   . H/O hiatal hernia   . H/O vertigo 12-06-12   none recent  . Hyperlipidemia   . IBS (irritable bowel syndrome)   . Neuropathy   . Osteopenia   . Peripheral neuropathy 12-06-12   peripheral neuropathy  . Rhinitis   . RLS (restless legs syndrome)   . Vitamin B12 deficiency     Patient Active Problem List   Diagnosis Date Noted  . TIA (transient ischemic attack) 11/05/2016  . Vitamin B12 deficiency 11/05/2016  . GERD (gastroesophageal reflux disease) 11/05/2016  . MCI (mild cognitive impairment) 08/20/2015  . Hereditary and idiopathic peripheral neuropathy 08/20/2015  . Benign essential tremor 08/20/2015  . Vitamin D deficiency 01/06/2015  .  Prediabetes 01/06/2015  . Medication management 01/06/2015  . Primary osteoarthritis of right knee 11/25/2014  . Bladder neck obstruction 03/28/2011  . Malignant neoplasm of prostate ([place]) 03/28/2011  . INTERMITTENT VERTIGO 10/12/2009  . Mixed hyperlipidemia 04/10/2009  . Essential hypertension, benign 04/10/2009  . CORONARY ATHEROSCLEROSIS NATIVE CORONARY ARTERY 04/10/2009    Past Surgical History:  Procedure Laterality Date  . APPENDECTOMY    . CARPAL TUNNEL RELEASE     both hands  . cataract surgery Left 12-06-12   recent surgery  . CHOLECYSTECTOMY    . COLONOSCOPY WITH PROPOFOL N/A 12/25/2012   Procedure: COLONOSCOPY WITH PROPOFOL;  Surgeon: Garlan Fair, MD;  Location: [facility];  Service: Endoscopy;  Laterality: N/A;  . ESOPHAGOGASTRODUODENOSCOPY (EGD) WITH PROPOFOL N/A 12/25/2012   Procedure: ESOPHAGOGASTRODUODENOSCOPY (EGD) WITH PROPOFOL;  Surgeon: Garlan Fair, MD;  Location: [facility];  Service: Endoscopy;  Laterality: N/A;  . PROSTATE SURGERY  12-06-12  . TONSILLECTOMY    . TOTAL KNEE ARTHROPLASTY Right 11/25/2014   Procedure: RIGHT TOTAL KNEE ARTHROPLASTY;  Surgeon: Melrose Nakayama, MD;  Location: [facility];  Service: Orthopedics;  Laterality: Right;       Home Medications    Prior to Admission medications   Medication Sig Start Date End Date Taking? Authorizing Provider  aspirin EC 325 MG EC tablet Take 1 tablet (325 mg total) by mouth at bedtime. 11/06/16   Elgergawy, Silver Huguenin, MD  Calcium Carbonate-Vitamin D (CALTRATE 600+D PO)  Take 1 tablet by mouth daily.     [provider]  Cholecalciferol (VITAMIN D) 2000 UNITS tablet Take 8,000 Units by mouth daily.    [provider]  diphenoxylate-atropine (LOMOTIL) 2.5-0.025 MG tablet TAKE ONE TABLET BY MOUTH 4 TIMES DAILY AS NEEDED FOR DIARRHEA OR  LOOSE  STOOLS 04/16/16   Unk Pinto, MD  gabapentin (NEURONTIN) 400 MG capsule Take 400 mg by mouth 3 (three) times daily.    [provider]  ipratropium (ATROVENT) 0.03 % nasal spray Place 2 sprays into the nose 3 (three) times daily. 11/10/16 11/10/17  Vicie Mutters, PA-C  propranolol (INDERAL) 20 MG tablet Take 1 tablet (20 mg total) by mouth 2 (two) times daily. 10/14/15   Unk Pinto, MD  sertraline (ZOLOFT) 100 MG tablet 1 pill daily for mood 02/15/17 02/15/18  Vicie Mutters, PA-C  simvastatin (ZOCOR) 20 MG tablet Take 1 tablet (20 mg total) by mouth every evening. 10/14/15   Unk Pinto, MD    Family History Family History  Problem Relation Age of Onset  . CAD Father        Deceased, 36  . Dementia Mother        Deceased, 63  . Diabetes Brother   . Hyperlipidemia Brother   . Hypertension Brother   . Dementia Brother   . Colon cancer Neg Hx   . Colon polyps Neg Hx   . Kidney disease Neg Hx   . Esophageal cancer Neg Hx   . Gallbladder disease Neg Hx     Social History Social History  Substance Use Topics  . Smoking status: Former Smoker    Packs/day: 0.50    Years: 30.00    Quit date: 06/14/1963  . Smokeless tobacco: Never Used  . Alcohol use 0.0 oz/week     Comment: Socially     Allergies   Niacin; Penicillins; and Tramadol   Review of Systems Review of Systems  All other systems reviewed and are negative.    Physical Exam Updated Vital Signs BP (!) 171/70 (BP Location: Left Arm)   Pulse (!) 109   Temp 98.1 F (36.7 C) (Oral)   Resp 16   SpO2 97%   Physical Exam  Constitutional: He is oriented to person, place, and time. He appears well-developed.  HENT:  Head: Normocephalic and atraumatic.  Right Ear: External ear normal.  Left Ear: External ear normal.  Nose: Nose normal.  Mouth/Throat: Oropharynx is clear and moist.  Eyes: Conjunctivae are normal.  Neck: Normal range of motion.  Cardiovascular: Normal rate and regular rhythm.   Pulmonary/Chest: Effort normal and breath sounds normal.  Abdominal: Soft. Bowel sounds are normal. There is no tenderness.  Musculoskeletal:  Normal range of motion.  Neurological: He is alert and oriented to person, place, and time.  Skin: Skin is warm and dry.  Psychiatric: He has a normal mood and affect.  Nursing note and vitals reviewed.    ED Treatments / Results  Labs (all labs ordered are listed, but only abnormal results are displayed) Labs Reviewed  COMPREHENSIVE METABOLIC PANEL  CBC  POC OCCULT BLOOD, ED  TYPE AND SCREEN    EKG  EKG Interpretation None       Radiology No results found.  Procedures Procedures (including critical care time)  Medications Ordered in ED Medications - No data to display   Initial Impression / Assessment and Plan / ED Course  I have reviewed the triage vital signs and the nursing notes.  Pertinent labs & imaging results that were available during my care of the patient were reviewed by me and considered in my medical decision making (see chart for details).      83year-old man episode of bowel movements a day which was dark. This is a single isolated episode. Stool here is yellow. However, and is heme positive. Patient's blood work is normal.  We have discussed return precautions and need for close follow-up. Patient has been seen by Dr. Fuller Plan in his instructed to call today to make appointment to be seen. He is instructed to return if has worsening symptoms or return of bleeding. Aspirin will be discontinued until Final Clinical Impressions(s) / ED Diagnoses   Final diagnoses:  Heme positive stool    New Prescriptions New Prescriptions   No medications on file     Pattricia Boss, MD 03/30/17 1220

## 2017-03-30 NOTE — ED Triage Notes (Signed)
Pt rpeorts 1 episode of diarrhea this morning, states, "it was black." Pt denies n/v.  Pt reports hx explosive diarrhea. VSS, NAD noted at this time.

## 2017-03-30 NOTE — ED Notes (Signed)
Assisted Dr with occult.

## 2017-04-05 ENCOUNTER — Ambulatory Visit (INDEPENDENT_AMBULATORY_CARE_PROVIDER_SITE_OTHER): Payer: Medicare Other | Admitting: Nurse Practitioner

## 2017-04-05 ENCOUNTER — Encounter: Payer: Self-pay | Admitting: Nurse Practitioner

## 2017-04-05 VITALS — BP 134/80 | HR 88 | Ht 70.0 in | Wt 174.8 lb

## 2017-04-05 DIAGNOSIS — I2510 Atherosclerotic heart disease of native coronary artery without angina pectoris: Secondary | ICD-10-CM | POA: Diagnosis not present

## 2017-04-05 DIAGNOSIS — R195 Other fecal abnormalities: Secondary | ICD-10-CM | POA: Diagnosis not present

## 2017-04-05 DIAGNOSIS — K589 Irritable bowel syndrome without diarrhea: Secondary | ICD-10-CM | POA: Diagnosis not present

## 2017-04-05 MED ORDER — OMEPRAZOLE 40 MG PO CPDR: 40.0000 mg | DELAYED_RELEASE_CAPSULE | ORAL | 3 refills | Status: DC

## 2017-04-05 NOTE — Progress Notes (Signed)
     Chief Complaint:   HPI: Patient is an 83 year old male known to Dr. Fuller Plan for hx IBS-D.   He was here in the ED on the eighteenth of this month with complaints of an explosive black bowel movement earlier in the morning. On exam his stool was yellow but Hemoccult positive. His blood work was normal. Patient was advised to follow-up with [name] and discontinue aspirin in the interim.Since ED visit patient has had a couple more incidences of black stool which were looser than normal. He hasn't had any hematemesis. No abdominal pain. No nausea. No weakness, dizziness, SOB or chest pain.    Past Medical History:  Diagnosis Date  . Anemia   . Arthritis    arthritis,osteopenia,"spinal stenosis"  . CAD (coronary artery disease)   . Cancer [facility]) 12-06-12   Prostate cancer'98  . Depression   . GERD (gastroesophageal reflux disease)   . H/O hiatal hernia   . H/O vertigo 12-06-12   none recent  . Hyperlipidemia   . IBS (irritable bowel syndrome)   . Neuropathy   . Osteopenia   . Peripheral neuropathy 12-06-12   peripheral neuropathy  . Rhinitis   . RLS (restless legs syndrome)   . Vitamin B12 deficiency     Patient's surgical history, family medical history, social history, medications and allergies were all reviewed in Epic    Physical Exam: BP 134/80   Pulse 88   Ht 5\' 10"  (1.778 m)   Wt 174 lb 12.8 oz (79.3 kg)   BMI 25.08 kg/m   GENERAL:  Well developed male in NAD PSYCH: :Pleasant, cooperative, normal affect EENT:  conjunctiva pink, mucous membranes moist, neck supple without masses CARDIAC:  RRR, murmur heard, no peripheral edema PULM: Normal respiratory effort, lungs CTA bilaterally, no wheezing ABDOMEN:  Nondistended, soft, nontender. No obvious masses, no hepatomegaly,  normal bowel sounds SKIN:  turgor, no lesions seen Musculoskeletal:  Normal muscle tone, normal strength NEURO: Alert and oriented x 3, no focal neurologic deficits    ASSESSMENT and PLAN:  1.  Pleasant 83 year old male with 2-3 recent episodes of black stool, heme positive yellow stool in ED on 10/19. ASA on hold now. Hgb in ED normal. No active bleeding at present -repeat CBC , BMET today -start daily PPI -Hold NSAIDS -EGD for further evaluation. The risks and benefits of EGD were discussed and the patient agrees to proceed.  -will call him with labs results  2  IBS-D. Patient has diarrhea with urgency after meals sometimes. Happens only 1-2 times a month but can create embarrassing situations for him. BMs normal the remaining times so can't really take anything for diarrhea as it may result in constipation. Unfortunately not much to offer until he can identify and avoid trigger foods.     Tye Savoy , NP 04/05/2017, 3:08 PM

## 2017-04-05 NOTE — Patient Instructions (Signed)
If you are age 81 or older, your body mass index should be between 23-30. Your Body mass index is 25.08 kg/m. If this is out of the aforementioned range listed, please consider follow up with your Primary Care Provider.  If you are age 36 or younger, your body mass index should be between 19-25. Your Body mass index is 25.08 kg/m. If this is out of the aformentioned range listed, please consider follow up with your Primary Care Provider.   You have been scheduled for an endoscopy. Please follow written instructions given to you at your visit today. If you use inhalers (even only as needed), please bring them with you on the day of your procedure. Your physician has requested that you go to www.startemmi.com and enter the access code given to you at your visit today. This web site gives a general overview about your procedure. However, you should still follow specific instructions given to you by our office regarding your preparation for the procedure.  Your physician has requested that you go to the basement for the following lab work before leaving today: CBC BMET  We have sent the following medications to your pharmacy for you to pick up at your convenience: Omeprazole  Thank you for choosing me and [facility].   Tye Savoy, NP

## 2017-04-10 NOTE — Progress Notes (Signed)
Reviewed and agree with initial management plan. Pt underwent colonoscopy and EGD by Howell Rucks, MD in 11/2012 for diarrhea and heme + stool and both procedures were normal.   Norberto Sorenson T. Fuller Plan, MD [facility]

## 2017-04-11 DIAGNOSIS — M6281 Muscle weakness (generalized): Secondary | ICD-10-CM | POA: Diagnosis not present

## 2017-04-11 DIAGNOSIS — R2681 Unsteadiness on feet: Secondary | ICD-10-CM | POA: Diagnosis not present

## 2017-04-11 DIAGNOSIS — M25561 Pain in right knee: Secondary | ICD-10-CM | POA: Diagnosis not present

## 2017-04-11 DIAGNOSIS — R262 Difficulty in walking, not elsewhere classified: Secondary | ICD-10-CM | POA: Diagnosis not present

## 2017-04-14 DIAGNOSIS — M25561 Pain in right knee: Secondary | ICD-10-CM | POA: Diagnosis not present

## 2017-04-14 DIAGNOSIS — R262 Difficulty in walking, not elsewhere classified: Secondary | ICD-10-CM | POA: Diagnosis not present

## 2017-04-14 DIAGNOSIS — M6281 Muscle weakness (generalized): Secondary | ICD-10-CM | POA: Diagnosis not present

## 2017-04-14 DIAGNOSIS — R2681 Unsteadiness on feet: Secondary | ICD-10-CM | POA: Diagnosis not present

## 2017-04-18 DIAGNOSIS — M6281 Muscle weakness (generalized): Secondary | ICD-10-CM | POA: Diagnosis not present

## 2017-04-18 DIAGNOSIS — R2681 Unsteadiness on feet: Secondary | ICD-10-CM | POA: Diagnosis not present

## 2017-04-18 DIAGNOSIS — M25561 Pain in right knee: Secondary | ICD-10-CM | POA: Diagnosis not present

## 2017-04-18 DIAGNOSIS — R262 Difficulty in walking, not elsewhere classified: Secondary | ICD-10-CM | POA: Diagnosis not present

## 2017-04-19 ENCOUNTER — Encounter: Payer: Self-pay | Admitting: Internal Medicine

## 2017-04-19 ENCOUNTER — Ambulatory Visit (INDEPENDENT_AMBULATORY_CARE_PROVIDER_SITE_OTHER): Payer: Medicare Other | Admitting: Internal Medicine

## 2017-04-19 VITALS — BP 140/72 | HR 92 | Temp 97.3°F | Resp 18 | Ht 70.0 in | Wt 173.4 lb

## 2017-04-19 DIAGNOSIS — D485 Neoplasm of uncertain behavior of skin: Secondary | ICD-10-CM | POA: Diagnosis not present

## 2017-04-19 DIAGNOSIS — I10 Essential (primary) hypertension: Secondary | ICD-10-CM

## 2017-04-19 DIAGNOSIS — I2510 Atherosclerotic heart disease of native coronary artery without angina pectoris: Secondary | ICD-10-CM

## 2017-04-19 NOTE — Progress Notes (Signed)
  Subjective:    Patient ID: Ryan Humphrey, male    DOB: 14-Nov-1928, 83 y.o.   MRN: [id number]  HPI  This very nice 83 yo WWM presented for BP check and also has concerns re: a non-healing scaly lesion of his R shoulder. Denies HA's, dizziness, CP, palpitations, dyspnea or edema.   Medication Sig  . aspirin EC 325 MG EC  Take 1 tab at bedtime.  Marland Kitchen CALTRATE 600+D  Take 1 tab daily.   Marland Kitchen VITAMIN D 2000 UNITS  Take 8,000 Units  daily.  Marland Kitchen gabapentin  400 MG capsule Take  3 x daily.  . ATROVENT 0.03 % nasal spray Place 2 sprays into the nose 3 x daily.  Marland Kitchen omeprazole  40 MG capsule Take 1 cap every morning  . propranolol  20 MG tablet Take 1 tab 2 (two) times daily.  . sertraline  100 MG tablet 1 pill daily for mood  . Simvastatin  20 MG tablet Take 1 tab every evening.   Allergies  Allergen Reactions  . Niacin Itching  . Penicillins Other (See Comments)    "rash"  . Tramadol Itching   Past Medical History:  Diagnosis Date  . Anemia   . Arthritis    arthritis,osteopenia,"spinal stenosis"  . CAD (coronary artery disease)   . Cancer [facility]) 12-06-12   Prostate cancer'98  . Depression   . GERD (gastroesophageal reflux disease)   . H/O hiatal hernia   . H/O vertigo 12-06-12   none recent  . Hyperlipidemia   . IBS (irritable bowel syndrome)   . Neuropathy   . Osteopenia   . Peripheral neuropathy 12-06-12   peripheral neuropathy  . Rhinitis   . RLS (restless legs syndrome)   . Vitamin B12 deficiency    Past Surgical History:  Procedure Laterality Date  . APPENDECTOMY    . CARPAL TUNNEL RELEASE     both hands  . cataract surgery Left 12-06-12   recent surgery  . CHOLECYSTECTOMY    . PROSTATE SURGERY  12-06-12  . TONSILLECTOMY     Review of Systems  10 point systems review negative except as above.    Objective:   Physical Exam  BP 140/72   Pulse 92   Temp (!) 97.3 F (36.3 C)   Resp 18   Ht 5\' 10"  (1.778 m)   Wt 173 lb 6.4 oz (78.7 kg)   BMI 24.88 kg/m   In no  distress.  HEENT - WNL. Neck - supple.  Chest - Clear equal BS. Cor - Nl HS. RRR w/o sig MGR. PP 1(+). No edema. MS- FROM w/o deformities.  Gait Nl. Neuro -  Nl w/o focal abnormalities. Skin - there is a 1 x 1 cm scaly pink moist area over the mid R shoulder.   Procedure (CPT - 11300)   - after informed consent and aseptic prep and local anesthesia with 1.0 ml Marcaine 0.5% , the area in question of the R shoulder was sharply excised by shave technique and then the wound base was deeply hyfrecated at setting #9 to fulgurate any residual lesion components. Then sterile Abx Ung was applied and covered with a 2" x 3" Tegaderm dressing.     Assessment & Plan:   1. Essential hypertension, benign  - reassured  2. Neoplasm of uncertain behavior of skin  - Excised

## 2017-04-20 DIAGNOSIS — R2681 Unsteadiness on feet: Secondary | ICD-10-CM | POA: Diagnosis not present

## 2017-04-20 DIAGNOSIS — M6281 Muscle weakness (generalized): Secondary | ICD-10-CM | POA: Diagnosis not present

## 2017-04-20 DIAGNOSIS — R262 Difficulty in walking, not elsewhere classified: Secondary | ICD-10-CM | POA: Diagnosis not present

## 2017-04-20 DIAGNOSIS — M25561 Pain in right knee: Secondary | ICD-10-CM | POA: Diagnosis not present

## 2017-04-25 DIAGNOSIS — R262 Difficulty in walking, not elsewhere classified: Secondary | ICD-10-CM | POA: Diagnosis not present

## 2017-04-25 DIAGNOSIS — R2681 Unsteadiness on feet: Secondary | ICD-10-CM | POA: Diagnosis not present

## 2017-04-25 DIAGNOSIS — M6281 Muscle weakness (generalized): Secondary | ICD-10-CM | POA: Diagnosis not present

## 2017-04-25 DIAGNOSIS — M25561 Pain in right knee: Secondary | ICD-10-CM | POA: Diagnosis not present

## 2017-04-27 DIAGNOSIS — R262 Difficulty in walking, not elsewhere classified: Secondary | ICD-10-CM | POA: Diagnosis not present

## 2017-04-27 DIAGNOSIS — M25561 Pain in right knee: Secondary | ICD-10-CM | POA: Diagnosis not present

## 2017-04-27 DIAGNOSIS — M6281 Muscle weakness (generalized): Secondary | ICD-10-CM | POA: Diagnosis not present

## 2017-04-27 DIAGNOSIS — R2681 Unsteadiness on feet: Secondary | ICD-10-CM | POA: Diagnosis not present

## 2017-05-01 DIAGNOSIS — M6281 Muscle weakness (generalized): Secondary | ICD-10-CM | POA: Diagnosis not present

## 2017-05-01 DIAGNOSIS — R262 Difficulty in walking, not elsewhere classified: Secondary | ICD-10-CM | POA: Diagnosis not present

## 2017-05-01 DIAGNOSIS — M25561 Pain in right knee: Secondary | ICD-10-CM | POA: Diagnosis not present

## 2017-05-01 DIAGNOSIS — R2681 Unsteadiness on feet: Secondary | ICD-10-CM | POA: Diagnosis not present

## 2017-05-03 DIAGNOSIS — R2681 Unsteadiness on feet: Secondary | ICD-10-CM | POA: Diagnosis not present

## 2017-05-03 DIAGNOSIS — M25561 Pain in right knee: Secondary | ICD-10-CM | POA: Diagnosis not present

## 2017-05-03 DIAGNOSIS — R262 Difficulty in walking, not elsewhere classified: Secondary | ICD-10-CM | POA: Diagnosis not present

## 2017-05-03 DIAGNOSIS — M6281 Muscle weakness (generalized): Secondary | ICD-10-CM | POA: Diagnosis not present

## 2017-05-17 ENCOUNTER — Encounter: Payer: Self-pay | Admitting: Gastroenterology

## 2017-05-17 ENCOUNTER — Ambulatory Visit (AMBULATORY_SURGERY_CENTER): Payer: Medicare Other | Admitting: Gastroenterology

## 2017-05-17 ENCOUNTER — Other Ambulatory Visit: Payer: Self-pay

## 2017-05-17 VITALS — BP 149/81 | HR 78 | Temp 96.8°F | Resp 17 | Ht 70.0 in | Wt 174.0 lb

## 2017-05-17 DIAGNOSIS — K921 Melena: Secondary | ICD-10-CM | POA: Diagnosis not present

## 2017-05-17 DIAGNOSIS — R195 Other fecal abnormalities: Secondary | ICD-10-CM

## 2017-05-17 MED ORDER — SODIUM CHLORIDE 0.9 % IV SOLN: 500.0000 mL | Freq: Once | INTRAVENOUS | Status: DC

## 2017-05-17 NOTE — Progress Notes (Signed)
A/ox3 pleased with MAC, report to [facility]

## 2017-05-17 NOTE — Op Note (Signed)
[facility] Patient Name: Ryan Humphrey Procedure Date: 05/17/2017 1:52 PM MRN: [id number] Endoscopist: Ladene Artist , MD Age: 83 Referring MD:  Date of Birth: Jun 09, 1929 Gender: Male Account #: [id number] Procedure:                Upper GI endoscopy Indications:              Heme positive stool, Melena Medicines:                Monitored Anesthesia Care Procedure:                Pre-Anesthesia Assessment:                           - Prior to the procedure, a History and Physical                            was performed, and patient medications and                            allergies were reviewed. The patient's tolerance of                            previous anesthesia was also reviewed. The risks                            and benefits of the procedure and the sedation                            options and risks were discussed with the patient.                            All questions were answered, and informed consent                            was obtained. Prior Anticoagulants: The patient has                            taken no previous anticoagulant or antiplatelet                            agents. ASA Grade Assessment: II - A patient with                            mild systemic disease. After reviewing the risks                            and benefits, the patient was deemed in                            satisfactory condition to undergo the procedure.                           After obtaining informed consent, the endoscope was  passed under direct vision. Throughout the                            procedure, the patient's blood pressure, pulse, and                            oxygen saturations were monitored continuously. The                            Endoscope was introduced through the mouth, and                            advanced to the second part of duodenum. The upper                            GI endoscopy was accomplished  without difficulty.                            The patient tolerated the procedure well. Scope In: Scope Out: Findings:                 The examined esophagus was normal.                           The entire examined stomach was normal.                           The duodenal bulb and second portion of the                            duodenum were normal. Complications:            No immediate complications. Estimated Blood Loss:     Estimated blood loss: none. Impression:               - Normal esophagus.                           - Normal stomach.                           - Normal duodenal bulb and second portion of the                            duodenum.                           - No specimens collected. Recommendation:           - Patient has a contact number available for                            emergencies. The signs and symptoms of potential                            delayed complications were discussed with the  patient. Return to normal activities tomorrow.                            Written discharge instructions were provided to the                            patient.                           - Resume previous diet.                           - Continue present medications. Ladene Artist, MD 05/17/2017 2:04:05 PM This report has been signed electronically.

## 2017-05-17 NOTE — Progress Notes (Signed)
Pt's states no medical or surgical changes since previsit or office visit. 

## 2017-05-17 NOTE — Patient Instructions (Signed)
YOU HAD AN ENDOSCOPIC PROCEDURE TODAY AT THE [facility]:   Refer to the procedure report that was given to you for any specific questions about what was found during the examination.  If the procedure report does not answer your questions, please call your gastroenterologist to clarify.  If you requested that your care partner not be given the details of your procedure findings, then the procedure report has been included in a sealed envelope for you to review at your convenience later.  YOU SHOULD EXPECT: Some feelings of bloating in the abdomen. Passage of more gas than usual.  Walking can help get rid of the air that was put into your GI tract during the procedure and reduce the bloating. If you had a lower endoscopy (such as a colonoscopy or flexible sigmoidoscopy) you may notice spotting of blood in your stool or on the toilet paper. If you underwent a bowel prep for your procedure, you may not have a normal bowel movement for a few days.  Please Note:  You might notice some irritation and congestion in your nose or some drainage.  This is from the oxygen used during your procedure.  There is no need for concern and it should clear up in a day or so.  SYMPTOMS TO REPORT IMMEDIATELY:     Following upper endoscopy (EGD)  Vomiting of blood or coffee ground material  New chest pain or pain under the shoulder blades  Painful or persistently difficult swallowing  New shortness of breath  Fever of 100F or higher  Black, tarry-looking stools  For urgent or emergent issues, a gastroenterologist can be reached at any hour by calling [phone].   DIET:  We do recommend a small meal at first, but then you may proceed to your regular diet.  Drink plenty of fluids but you should avoid alcoholic beverages for 24 hours.  ACTIVITY:  You should plan to take it easy for the rest of today and you should NOT DRIVE or use heavy machinery until tomorrow (because of the sedation medicines  used during the test).    FOLLOW UP: Our staff will call the number listed on your records the next business day following your procedure to check on you and address any questions or concerns that you may have regarding the information given to you following your procedure. If we do not reach you, we will leave a message.  However, if you are feeling well and you are not experiencing any problems, there is no need to return our call.  We will assume that you have returned to your regular daily activities without incident.  If any biopsies were taken you will be contacted by phone or by letter within the next 1-3 weeks.  Please call us at [phone] if you have not heard about the biopsies in 3 weeks.    SIGNATURES/CONFIDENTIALITY: You and/or your care partner have signed paperwork which will be entered into your electronic medical record.  These signatures attest to the fact that that the information above on your After Visit Summary has been reviewed and is understood.  Full responsibility of the confidentiality of this discharge information lies with you and/or your care-partner.   Resume medications. 

## 2017-05-18 ENCOUNTER — Telehealth: Payer: Self-pay

## 2017-05-18 NOTE — Telephone Encounter (Signed)
  Follow up Call-  Call Ryan Humphrey number 05/17/2017  Post procedure Call Ryan Humphrey phone  # [phone]  Permission to leave phone message Yes  Some recent data might be hidden     Patient questions:  Do you have a fever, pain , or abdominal swelling? No. Pain Score  0 *  Have you tolerated food without any problems? Yes.    Have you been able to return to your normal activities? Yes.    Do you have any questions about your discharge instructions: Diet   No. Medications  No. Follow up visit  No.  Do you have questions or concerns about your Care? No.  Actions: * If pain score is 4 or above: No action needed, pain <4.

## 2017-05-23 ENCOUNTER — Ambulatory Visit (INDEPENDENT_AMBULATORY_CARE_PROVIDER_SITE_OTHER): Payer: Medicare Other | Admitting: Internal Medicine

## 2017-05-23 ENCOUNTER — Encounter: Payer: Self-pay | Admitting: Internal Medicine

## 2017-05-23 VITALS — BP 126/80 | HR 76 | Temp 97.3°F | Resp 16 | Ht 70.25 in | Wt 170.4 lb

## 2017-05-23 DIAGNOSIS — I2510 Atherosclerotic heart disease of native coronary artery without angina pectoris: Secondary | ICD-10-CM | POA: Diagnosis not present

## 2017-05-23 DIAGNOSIS — N320 Bladder-neck obstruction: Secondary | ICD-10-CM

## 2017-05-23 DIAGNOSIS — E559 Vitamin D deficiency, unspecified: Secondary | ICD-10-CM

## 2017-05-23 DIAGNOSIS — Z1212 Encounter for screening for malignant neoplasm of rectum: Secondary | ICD-10-CM

## 2017-05-23 DIAGNOSIS — I10 Essential (primary) hypertension: Secondary | ICD-10-CM

## 2017-05-23 DIAGNOSIS — R7303 Prediabetes: Secondary | ICD-10-CM

## 2017-05-23 DIAGNOSIS — Z79899 Other long term (current) drug therapy: Secondary | ICD-10-CM

## 2017-05-23 DIAGNOSIS — R7309 Other abnormal glucose: Secondary | ICD-10-CM | POA: Diagnosis not present

## 2017-05-23 DIAGNOSIS — Z136 Encounter for screening for cardiovascular disorders: Secondary | ICD-10-CM | POA: Diagnosis not present

## 2017-05-23 DIAGNOSIS — Z125 Encounter for screening for malignant neoplasm of prostate: Secondary | ICD-10-CM | POA: Diagnosis not present

## 2017-05-23 DIAGNOSIS — F17200 Nicotine dependence, unspecified, uncomplicated: Secondary | ICD-10-CM | POA: Diagnosis not present

## 2017-05-23 DIAGNOSIS — E538 Deficiency of other specified B group vitamins: Secondary | ICD-10-CM

## 2017-05-23 DIAGNOSIS — Z23 Encounter for immunization: Secondary | ICD-10-CM

## 2017-05-23 DIAGNOSIS — G459 Transient cerebral ischemic attack, unspecified: Secondary | ICD-10-CM | POA: Diagnosis not present

## 2017-05-23 DIAGNOSIS — E782 Mixed hyperlipidemia: Secondary | ICD-10-CM | POA: Diagnosis not present

## 2017-05-23 DIAGNOSIS — Z1211 Encounter for screening for malignant neoplasm of colon: Secondary | ICD-10-CM

## 2017-05-23 DIAGNOSIS — I700 Atherosclerosis of aorta: Secondary | ICD-10-CM | POA: Diagnosis not present

## 2017-05-23 NOTE — Patient Instructions (Signed)

## 2017-05-23 NOTE — Progress Notes (Signed)
[facility]   Unk Pinto, M.D.     Uvaldo Bristle. Silverio Lay, P.A.-C Liane Comber, [place]                [address] [place], [place] [address] Telephone [phone] Telefax [phone]  Comprehensive Evaluation & Examination     This very nice 83 y.o. WWM presents for a  comprehensive evaluation and management of multiple medical co-morbidities.  Patient has been followed for HTN, T2_NIDDM  Prediabetes, Hyperlipidemia and Vitamin D Deficiency.  Other problems include a remote hx/o prostate cancer in 1998  and is s/p TURP X 2  (1998 & 2006) and post-op Radiation. Patient had a 2sd TURP in 2006. Patient also is followed by Dr Posey Pronto for  peripheral sensory neuropathy, mild familial tremor and also mild Dementia. (he self d/c'd Exelon patch in the past).    Labile HTN predates since the 1990's. Patient's BP has apparently been controlled on prpp[ranolol that he also take for his familial tremor.  Today's BP is at goal - 126/80.  In 2016 , he had a negative/Normal Stress Myoview per Dr Dorris Carnes Patient denies any cardiac symptoms as chest pain, palpitations, shortness of breath, dizziness or ankle swelling.     Patient's hyperlipidemia is controlled with diet and medications. Patient denies myalgias or other medication SE's. Last lipids were at goal: Lab Results  Component Value Date   CHOL 179 02/15/2017   HDL 37 (L) 02/15/2017   LDLCALC 61 11/05/2016   TRIG 137 02/15/2017   CHOLHDL 4.8 02/15/2017      Patient is followed proactively for  prediabetes and patient denies reactive hypoglycemic symptoms, visual blurring, diabetic polys or paresthesias. Last A1c was normal & at goal: Lab Results  Component Value Date   HGBA1C 5.3 11/05/2016       Finally, patient has history of Vitamin D Deficiency  ("42" in 2016) and also has hx/o Osteoporosis on Fosamax x 5 years (2013). He had a TKR in 2016. last  vitamin D was still low: Lab Results  Component Value Date   VD25OH 72 04/22/2016   Current Outpatient Medications on File Prior to Visit  Medication Sig  . Calcium Carbonate-Vitamin D (CALTRATE 600+D PO) Take 1 tablet by mouth daily.   . Cholecalciferol (VITAMIN D) 2000 UNITS tablet Take 8,000 Units by mouth daily.  Marland Kitchen gabapentin (NEURONTIN) 400 MG capsule Take 400 mg by mouth 3 (three) times daily.  Marland Kitchen ipratropium (ATROVENT) 0.03 % nasal spray Place 2 sprays into the nose 3 (three) times daily.  Marland Kitchen omeprazole (PRILOSEC) 40 MG capsule Take 1 capsule (40 mg total) by mouth every morning. Before breakfast  . propranolol (INDERAL) 20 MG tablet Take 1 tablet (20 mg total) by mouth 2 (two) times daily.  . sertraline (ZOLOFT) 100 MG tablet 1 pill daily for mood  . simvastatin (ZOCOR) 20 MG tablet Take 1 tablet (20 mg total) by mouth every evening.  Marland Kitchen aspirin EC 325 MG EC tablet Take 1 tablet (325 mg total) by mouth at bedtime. (Patient not taking: Reported on 05/23/2017)   No current facility-administered medications on file prior to visit.    Allergies  Allergen Reactions  . Niacin Itching  . Penicillins Other (See Comments)    "rash"  . Tramadol Itching   Past Medical History:  Diagnosis Date  . Anemia   .  Arthritis    arthritis,osteopenia,"spinal stenosis"  . CAD (coronary artery disease)   . Cancer [facility]) 12-06-12   Prostate cancer'98  . Depression   . GERD (gastroesophageal reflux disease)   . H/O hiatal hernia   . H/O vertigo 12-06-12   none recent  . Hyperlipidemia   . IBS (irritable bowel syndrome)   . Neuropathy   . Osteopenia   . Peripheral neuropathy 12-06-12   peripheral neuropathy  . Rhinitis   . RLS (restless legs syndrome)   . Vitamin B12 deficiency    Health Maintenance  Topic Date Due  . FOOT EXAM  03/26/1941  . OPHTHALMOLOGY EXAM  03/26/1941  . TETANUS/TDAP  03/26/1950  . PNA vac Low Risk Adult (2 of 2 - PCV13) 01/19/2017  . URINE MICROALBUMIN  04/22/2017   . HEMOGLOBIN A1C  05/08/2017  . INFLUENZA VACCINE  Completed   Immunization History  Administered Date(s) Administered  . Influenza, High Dose Seasonal PF 03/05/2015  . Influenza-Unspecified 04/22/2016, 03/13/2017  . Pneumococcal Conjugate-13 05/23/2017  . Pneumococcal Polysaccharide-23 01/20/2016   Past Surgical History:  Procedure Laterality Date  . APPENDECTOMY    . CARPAL TUNNEL RELEASE     both hands  . cataract surgery Left 12-06-12   recent surgery  . CHOLECYSTECTOMY    . COLONOSCOPY WITH PROPOFOL N/A 12/25/2012   Procedure: COLONOSCOPY WITH PROPOFOL;  Surgeon: Garlan Fair, MD;  Location: [facility];  Service: Endoscopy;  Laterality: N/A;  . ESOPHAGOGASTRODUODENOSCOPY (EGD) WITH PROPOFOL N/A 12/25/2012   Procedure: ESOPHAGOGASTRODUODENOSCOPY (EGD) WITH PROPOFOL;  Surgeon: Garlan Fair, MD;  Location: [facility];  Service: Endoscopy;  Laterality: N/A;  . PROSTATE SURGERY  12-06-12  . TONSILLECTOMY    . TOTAL KNEE ARTHROPLASTY Right 11/25/2014   Procedure: RIGHT TOTAL KNEE ARTHROPLASTY;  Surgeon: Melrose Nakayama, MD;  Location: [facility];  Service: Orthopedics;  Laterality: Right;   Family History  Problem Relation Age of Onset  . CAD Father        Deceased, 50  . Dementia Mother        Deceased, 37  . Diabetes Brother   . Hyperlipidemia Brother   . Hypertension Brother   . Dementia Brother   . Colon cancer Neg Hx   . Colon polyps Neg Hx   . Kidney disease Neg Hx   . Esophageal cancer Neg Hx   . Gallbladder disease Neg Hx    Social History   Socioeconomic History  . Marital status: Widowed  . Number of children: 2  . Years of education: College  Tobacco Use  . Smoking status: Former Smoker    Packs/day: 0.50    Years: 30.00    Pack years: 15.00    Last attempt to quit: 06/14/1963    Years since quitting: 53.9  . Smokeless tobacco: Never Used  Substance and Sexual Activity  . Alcohol use: Yes    Alcohol/week: 0.0 oz    Comment: Socially  . Drug use:  No  . Sexual activity: No  Social History Narrative   Pt lives at home, widowed in 2011, married for 55 years. They have two grown daugthers.   Plays the piano.   Caffeine Use: Rarely    ROS Constitutional: Denies fever, chills, weight loss/gain, headaches, insomnia,  night sweats or change in appetite. Does c/o fatigue. Eyes: Denies redness, blurred vision, diplopia, discharge, itchy or watery eyes.  ENT: Denies discharge, congestion, post nasal drip, epistaxis, sore throat, earache, hearing loss, dental pain, Tinnitus, Vertigo, Sinus pain  or snoring.  Cardio: Denies chest pain, palpitations, irregular heartbeat, syncope, dyspnea, diaphoresis, orthopnea, PND, claudication or edema Respiratory: denies cough, dyspnea, DOE, pleurisy, hoarseness, laryngitis or wheezing.  Gastrointestinal: Denies dysphagia, heartburn, reflux, water brash, pain, cramps, nausea, vomiting, bloating, diarrhea, constipation, hematemesis, melena, hematochezia, jaundice or hemorrhoids Genitourinary: Denies dysuria, frequency, urgency, nocturia, hesitancy, discharge, hematuria or flank pain Musculoskeletal: Denies arthralgia, myalgia, stiffness, Jt. Swelling, pain, limp or strain/sprain. Denies Falls. Skin: Denies puritis, rash, hives, warts, acne, eczema or change in skin lesion Neuro: No weakness, tremor, incoordination, spasms, paresthesia or pain Psychiatric: Denies confusion, memory loss or sensory loss. Denies Depression. Endocrine: Denies change in weight, skin, hair change, nocturia, and paresthesia, diabetic polys, visual blurring or hyper / hypo glycemic episodes.  Heme/Lymph: No excessive bleeding, bruising or enlarged lymph nodes.  Physical Exam  BP 126/80   Pulse 76   Temp (!) 97.3 F (36.3 C)   Resp 16   Ht 5' 10.25" (1.784 m)   Wt 170 lb 6.4 oz (77.3 kg)   BMI 24.28 kg/m   General Appearance: Well nourished and well groomed and in no apparent distress.  Eyes: PERRLA, EOMs, conjunctiva no  swelling or erythema, normal fundi and vessels. Sinuses: No frontal/maxillary tenderness ENT/Mouth: EACs patent / TMs  nl. Nares clear without erythema, swelling, mucoid exudates. Oral hygiene is good. No erythema, swelling, or exudate. Tongue normal, non-obstructing. Tonsils not swollen or erythematous. Hearing normal.  Neck: Supple, thyroid normal. No bruits, nodes or JVD. Respiratory: Respiratory effort normal.  BS equal and clear bilateral without rales, rhonci, wheezing or stridor. Cardio: Heart sounds are normal with regular rate and rhythm and no murmurs, rubs or gallops. Peripheral pulses are normal and equal bilaterally without edema. No aortic or femoral bruits. Chest: symmetric with normal excursions and percussion.  Abdomen: Soft, with Nl bowel sounds. Nontender, no guarding, rebound, hernias, masses, or organomegaly.  Lymphatics: Non tender without lymphadenopathy.  Genitourinary: No hernias.Testes nl. DRE - prostate nl for age - smooth & firm w/o nodules. Musculoskeletal: Full ROM all peripheral extremities, joint stability, 5/5 strength, and normal gait. Skin: Warm and dry without rashes, lesions, cyanosis, clubbing or  ecchymosis.  Neuro: Cranial nerves intact, reflexes equal bilaterally. Normal muscle tone, no cerebellar symptoms. Sensation intact.  Pysch: Alert and oriented X 3 with normal affect, insight and judgment appropriate.   Assessment and Plan  1. Essential hypertension, benign  - EKG 12-Lead - [name], RETROPERITNL ABD,  LTD - Urinalysis, Routine w reflex microscopic - CBC with Differential/Platelet - BASIC METABOLIC PANEL WITH GFR - Magnesium - TSH - Microalbumin / creatinine urine ratio  2. Hyperlipidemia, mixed  - EKG 12-Lead - [name], RETROPERITNL ABD,  LTD - Hepatic function panel - Lipid panel - TSH  3. Prediabetes  - EKG 12-Lead - [name], RETROPERITNL ABD,  LTD - Hemoglobin A1c - Insulin, random  4. Vitamin D deficiency  - VITAMIN D 25 Hydroxy  5.  Abnormal glucose  - Hemoglobin A1c - Insulin, random  6. Smoker  - [name], RETROPERITNL ABD,  LTD  7. Aortic atherosclerosis (HCC)  - [name], RETROPERITNL ABD,  LTD  8. Atherosclerosis of native coronary artery of native heart without angina pectoris  - EKG 12-Lead  9. Prostate cancer screening  - PSA  10. Bladder neck obstruction  - PSA  11. Screening for colorectal cancer  - POC Hemoccult Bld/Stl  12. Screening for ischemic heart disease  - EKG 12-Lead  13. Screening for AAA (aortic abdominal aneurysm)  - [name],  RETROPERITNL ABD,  LTD  14. Transient cerebral ischemia,  hx   15. Vitamin B12 deficiency  - Vitamin B12  16. Medication management  - Urinalysis, Routine w reflex microscopic - CBC with Differential/Platelet - BASIC METABOLIC PANEL WITH GFR - Hepatic function panel - Magnesium - Lipid panel - TSH - Hemoglobin A1c - Insulin, random - VITAMIN D 25 Hydroxy  - Microalbumin / creatinine urine ratio  17. Need for prophylactic vaccination against Streptococcus pneumoniae (pneumococcus)       Patient was counseled in prudent diet, weight control to achieve/maintain BMI less than 25, BP monitoring, regular exercise and medications as discussed.  Discussed med effects and SE's. Routine screening labs and tests as requested with regular follow-up as recommended. Over 40 minutes of exam, counseling, chart review and high complex critical decision making was performed

## 2017-05-24 ENCOUNTER — Telehealth: Payer: Self-pay | Admitting: *Deleted

## 2017-05-24 DIAGNOSIS — R7309 Other abnormal glucose: Secondary | ICD-10-CM | POA: Diagnosis not present

## 2017-05-24 DIAGNOSIS — I10 Essential (primary) hypertension: Secondary | ICD-10-CM | POA: Diagnosis not present

## 2017-05-24 DIAGNOSIS — E782 Mixed hyperlipidemia: Secondary | ICD-10-CM | POA: Diagnosis not present

## 2017-05-24 DIAGNOSIS — Z79899 Other long term (current) drug therapy: Secondary | ICD-10-CM | POA: Diagnosis not present

## 2017-05-24 DIAGNOSIS — R7303 Prediabetes: Secondary | ICD-10-CM | POA: Diagnosis not present

## 2017-05-24 DIAGNOSIS — E538 Deficiency of other specified B group vitamins: Secondary | ICD-10-CM | POA: Diagnosis not present

## 2017-05-24 DIAGNOSIS — E559 Vitamin D deficiency, unspecified: Secondary | ICD-10-CM | POA: Diagnosis not present

## 2017-05-24 DIAGNOSIS — N320 Bladder-neck obstruction: Secondary | ICD-10-CM | POA: Diagnosis not present

## 2017-05-24 LAB — CBC WITH DIFFERENTIAL/PLATELET
BASOS PCT: 0.4 %
Basophils Absolute: 32 cells/uL (ref 0–200)
Eosinophils Absolute: 103 cells/uL (ref 15–500)
Eosinophils Relative: 1.3 %
HCT: 42.3 % (ref 38.5–50.0)
HEMOGLOBIN: 14.6 g/dL (ref 13.2–17.1)
Lymphs Abs: 845 cells/uL — ABNORMAL LOW (ref 850–3900)
MCH: 31.7 pg (ref 27.0–33.0)
MCHC: 34.5 g/dL (ref 32.0–36.0)
MCV: 92 fL (ref 80.0–100.0)
MONOS PCT: 7.8 %
MPV: 11.7 fL (ref 7.5–12.5)
NEUTROS ABS: 6304 {cells}/uL (ref 1500–7800)
Neutrophils Relative %: 79.8 %
PLATELETS: 158 10*3/uL (ref 140–400)
RBC: 4.6 10*6/uL (ref 4.20–5.80)
RDW: 11.7 % (ref 11.0–15.0)
TOTAL LYMPHOCYTE: 10.7 %
WBC: 7.9 10*3/uL (ref 3.8–10.8)
WBCMIX: 616 {cells}/uL (ref 200–950)

## 2017-05-24 LAB — LIPID PANEL
CHOLESTEROL: 218 mg/dL — AB (ref <200)
HDL: 41 mg/dL (ref >40)
LDL CHOLESTEROL (CALC): 150 mg/dL — AB
Non-HDL Cholesterol (Calc): 177 mg/dL (calc) — ABNORMAL HIGH (ref <130)
TRIGLYCERIDES: 148 mg/dL (ref <150)
Total CHOL/HDL Ratio: 5.3 (calc) — ABNORMAL HIGH (ref <5.0)

## 2017-05-24 LAB — BASIC METABOLIC PANEL WITH GFR
BUN/Creatinine Ratio: 12 (calc) (ref 6–22)
BUN: 17 mg/dL (ref 7–25)
CALCIUM: 9.6 mg/dL (ref 8.6–10.3)
CHLORIDE: 102 mmol/L (ref 98–110)
CO2: 30 mmol/L (ref 20–32)
Creat: 1.43 mg/dL — ABNORMAL HIGH (ref 0.70–1.11)
GFR, Est African American: 51 mL/min/{1.73_m2} — ABNORMAL LOW (ref >=60)
GFR, Est Non African American: 44 mL/min/{1.73_m2} — ABNORMAL LOW (ref >=60)
GLUCOSE: 88 mg/dL (ref 65–99)
Potassium: 4 mmol/L (ref 3.5–5.3)
Sodium: 142 mmol/L (ref 135–146)

## 2017-05-24 LAB — INSULIN, RANDOM: INSULIN: 3.4 u[IU]/mL (ref 2.0–19.6)

## 2017-05-24 LAB — HEPATIC FUNCTION PANEL
AG Ratio: 1.8 (calc) (ref 1.0–2.5)
ALBUMIN MSPROF: 4.4 g/dL (ref 3.6–5.1)
ALT: 6 U/L — AB (ref 9–46)
AST: 12 U/L (ref 10–35)
Alkaline phosphatase (APISO): 77 U/L (ref 40–115)
Bilirubin, Direct: 0.1 mg/dL (ref 0.0–0.2)
GLOBULIN: 2.5 g/dL (ref 1.9–3.7)
Indirect Bilirubin: 0.9 mg/dL (calc) (ref 0.2–1.2)
TOTAL PROTEIN: 6.9 g/dL (ref 6.1–8.1)
Total Bilirubin: 1 mg/dL (ref 0.2–1.2)

## 2017-05-24 LAB — HEMOGLOBIN A1C
EAG (MMOL/L): 5.5 (calc)
HEMOGLOBIN A1C: 5.1 %{Hb} (ref <5.7)
MEAN PLASMA GLUCOSE: 100 (calc)

## 2017-05-24 LAB — VITAMIN D 25 HYDROXY (VIT D DEFICIENCY, FRACTURES): Vit D, 25-Hydroxy: 55 ng/mL (ref 30–100)

## 2017-05-24 LAB — TSH: TSH: 2.94 m[IU]/L (ref 0.40–4.50)

## 2017-05-24 LAB — VITAMIN B12: Vitamin B-12: 305 pg/mL (ref 200–1100)

## 2017-05-24 LAB — PSA: PSA: 1.2 ng/mL (ref <=4.0)

## 2017-05-24 LAB — MAGNESIUM: MAGNESIUM: 2.1 mg/dL (ref 1.5–2.5)

## 2017-05-24 NOTE — Telephone Encounter (Signed)
  Follow up Call-  Call back number 05/17/2017  Post procedure Call Back phone  # [phone]  Permission to leave phone message Yes  Some recent data might be hidden     Patient questions:   Message left to call us if necessary. Second call.

## 2017-05-25 LAB — MICROALBUMIN / CREATININE URINE RATIO
CREATININE, URINE: 161 mg/dL (ref 20–320)
MICROALB UR: 1.1 mg/dL
Microalb Creat Ratio: 7 mcg/mg creat (ref <30)

## 2017-05-25 LAB — URINALYSIS, ROUTINE W REFLEX MICROSCOPIC
Bilirubin Urine: NEGATIVE
Glucose, UA: NEGATIVE
Hgb urine dipstick: NEGATIVE
KETONES UR: NEGATIVE
Leukocytes, UA: NEGATIVE
NITRITE: NEGATIVE
Protein, ur: NEGATIVE
SPECIFIC GRAVITY, URINE: 1.016 (ref 1.001–1.03)
pH: 6.5 (ref 5.0–8.0)

## 2017-05-29 ENCOUNTER — Encounter: Payer: Self-pay | Admitting: *Deleted

## 2017-07-11 DIAGNOSIS — Z9079 Acquired absence of other genital organ(s): Secondary | ICD-10-CM | POA: Diagnosis not present

## 2017-07-11 DIAGNOSIS — R319 Hematuria, unspecified: Secondary | ICD-10-CM | POA: Diagnosis not present

## 2017-07-11 DIAGNOSIS — Z923 Personal history of irradiation: Secondary | ICD-10-CM | POA: Diagnosis not present

## 2017-07-11 DIAGNOSIS — R310 Gross hematuria: Secondary | ICD-10-CM | POA: Diagnosis not present

## 2017-07-11 DIAGNOSIS — Z87891 Personal history of nicotine dependence: Secondary | ICD-10-CM | POA: Diagnosis not present

## 2017-07-11 DIAGNOSIS — Z8546 Personal history of malignant neoplasm of prostate: Secondary | ICD-10-CM | POA: Diagnosis not present

## 2017-08-14 DIAGNOSIS — M25562 Pain in left knee: Secondary | ICD-10-CM | POA: Diagnosis not present

## 2017-08-14 DIAGNOSIS — M545 Low back pain: Secondary | ICD-10-CM | POA: Diagnosis not present

## 2017-08-16 ENCOUNTER — Encounter (HOSPITAL_COMMUNITY): Payer: Self-pay | Admitting: Emergency Medicine

## 2017-08-16 ENCOUNTER — Emergency Department (HOSPITAL_COMMUNITY): Payer: Medicare Other

## 2017-08-16 ENCOUNTER — Inpatient Hospital Stay (HOSPITAL_COMMUNITY)
Admission: EM | Admit: 2017-08-16 | Discharge: 2017-08-18 | DRG: 866 | Disposition: A | Discharge status: Home Health | Payer: Medicare Other | Attending: Internal Medicine | Admitting: Internal Medicine

## 2017-08-16 DIAGNOSIS — I959 Hypotension, unspecified: Secondary | ICD-10-CM | POA: Diagnosis not present

## 2017-08-16 DIAGNOSIS — S0990XA Unspecified injury of head, initial encounter: Secondary | ICD-10-CM | POA: Diagnosis not present

## 2017-08-16 DIAGNOSIS — M4800 Spinal stenosis, site unspecified: Secondary | ICD-10-CM | POA: Diagnosis present

## 2017-08-16 DIAGNOSIS — R404 Transient alteration of awareness: Secondary | ICD-10-CM | POA: Diagnosis not present

## 2017-08-16 DIAGNOSIS — N179 Acute kidney failure, unspecified: Secondary | ICD-10-CM | POA: Diagnosis not present

## 2017-08-16 DIAGNOSIS — Z888 Allergy status to other drugs, medicaments and biological substances status: Secondary | ICD-10-CM

## 2017-08-16 DIAGNOSIS — Z96651 Presence of right artificial knee joint: Secondary | ICD-10-CM | POA: Diagnosis present

## 2017-08-16 DIAGNOSIS — J1081 Influenza due to other identified influenza virus with encephalopathy: Principal | ICD-10-CM | POA: Diagnosis present

## 2017-08-16 DIAGNOSIS — N183 Chronic kidney disease, stage 3 unspecified: Secondary | ICD-10-CM | POA: Diagnosis present

## 2017-08-16 DIAGNOSIS — E785 Hyperlipidemia, unspecified: Secondary | ICD-10-CM | POA: Diagnosis present

## 2017-08-16 DIAGNOSIS — Z87891 Personal history of nicotine dependence: Secondary | ICD-10-CM

## 2017-08-16 DIAGNOSIS — F329 Major depressive disorder, single episode, unspecified: Secondary | ICD-10-CM | POA: Diagnosis present

## 2017-08-16 DIAGNOSIS — I129 Hypertensive chronic kidney disease with stage 1 through stage 4 chronic kidney disease, or unspecified chronic kidney disease: Secondary | ICD-10-CM | POA: Diagnosis present

## 2017-08-16 DIAGNOSIS — K589 Irritable bowel syndrome without diarrhea: Secondary | ICD-10-CM | POA: Diagnosis present

## 2017-08-16 DIAGNOSIS — I4510 Unspecified right bundle-branch block: Secondary | ICD-10-CM | POA: Diagnosis present

## 2017-08-16 DIAGNOSIS — R531 Weakness: Secondary | ICD-10-CM | POA: Diagnosis not present

## 2017-08-16 DIAGNOSIS — G3184 Mild cognitive impairment, so stated: Secondary | ICD-10-CM | POA: Diagnosis present

## 2017-08-16 DIAGNOSIS — S199XXA Unspecified injury of neck, initial encounter: Secondary | ICD-10-CM | POA: Diagnosis not present

## 2017-08-16 DIAGNOSIS — R05 Cough: Secondary | ICD-10-CM | POA: Diagnosis not present

## 2017-08-16 DIAGNOSIS — G629 Polyneuropathy, unspecified: Secondary | ICD-10-CM | POA: Diagnosis present

## 2017-08-16 DIAGNOSIS — J101 Influenza due to other identified influenza virus with other respiratory manifestations: Secondary | ICD-10-CM | POA: Diagnosis present

## 2017-08-16 DIAGNOSIS — E559 Vitamin D deficiency, unspecified: Secondary | ICD-10-CM | POA: Diagnosis present

## 2017-08-16 DIAGNOSIS — I2510 Atherosclerotic heart disease of native coronary artery without angina pectoris: Secondary | ICD-10-CM | POA: Diagnosis present

## 2017-08-16 DIAGNOSIS — R55 Syncope and collapse: Secondary | ICD-10-CM | POA: Diagnosis not present

## 2017-08-16 DIAGNOSIS — G2581 Restless legs syndrome: Secondary | ICD-10-CM | POA: Diagnosis present

## 2017-08-16 DIAGNOSIS — F0390 Unspecified dementia without behavioral disturbance: Secondary | ICD-10-CM | POA: Diagnosis present

## 2017-08-16 DIAGNOSIS — J111 Influenza due to unidentified influenza virus with other respiratory manifestations: Secondary | ICD-10-CM

## 2017-08-16 DIAGNOSIS — R2681 Unsteadiness on feet: Secondary | ICD-10-CM | POA: Diagnosis present

## 2017-08-16 DIAGNOSIS — R402414 Glasgow coma scale score 13-15, 24 hours or more after hospital admission: Secondary | ICD-10-CM | POA: Diagnosis not present

## 2017-08-16 DIAGNOSIS — I491 Atrial premature depolarization: Secondary | ICD-10-CM | POA: Diagnosis present

## 2017-08-16 DIAGNOSIS — Z885 Allergy status to narcotic agent status: Secondary | ICD-10-CM

## 2017-08-16 DIAGNOSIS — Z880 Allergy status to penicillin: Secondary | ICD-10-CM

## 2017-08-16 DIAGNOSIS — M8580 Other specified disorders of bone density and structure, unspecified site: Secondary | ICD-10-CM | POA: Diagnosis present

## 2017-08-16 DIAGNOSIS — K219 Gastro-esophageal reflux disease without esophagitis: Secondary | ICD-10-CM | POA: Diagnosis present

## 2017-08-16 DIAGNOSIS — R6883 Chills (without fever): Secondary | ICD-10-CM | POA: Diagnosis not present

## 2017-08-16 DIAGNOSIS — I10 Essential (primary) hypertension: Secondary | ICD-10-CM | POA: Diagnosis present

## 2017-08-16 DIAGNOSIS — M1990 Unspecified osteoarthritis, unspecified site: Secondary | ICD-10-CM | POA: Diagnosis present

## 2017-08-16 DIAGNOSIS — G9340 Encephalopathy, unspecified: Secondary | ICD-10-CM | POA: Diagnosis present

## 2017-08-16 DIAGNOSIS — Z79899 Other long term (current) drug therapy: Secondary | ICD-10-CM

## 2017-08-16 DIAGNOSIS — F0280 Dementia in other diseases classified elsewhere without behavioral disturbance: Secondary | ICD-10-CM | POA: Diagnosis present

## 2017-08-16 LAB — CBC WITH DIFFERENTIAL/PLATELET
BASOS PCT: 0 %
Basophils Absolute: 0 10*3/uL (ref 0.0–0.1)
EOS ABS: 0.1 10*3/uL (ref 0.0–0.7)
Eosinophils Relative: 1 %
HCT: 43.1 % (ref 39.0–52.0)
HEMOGLOBIN: 14.7 g/dL (ref 13.0–17.0)
Lymphocytes Relative: 6 %
Lymphs Abs: 0.4 10*3/uL — ABNORMAL LOW (ref 0.7–4.0)
MCH: 32.5 pg (ref 26.0–34.0)
MCHC: 34.1 g/dL (ref 30.0–36.0)
MCV: 95.4 fL (ref 78.0–100.0)
Monocytes Absolute: 0.7 10*3/uL (ref 0.1–1.0)
Monocytes Relative: 10 %
NEUTROS PCT: 83 %
Neutro Abs: 6.3 10*3/uL (ref 1.7–7.7)
PLATELETS: 121 10*3/uL — AB (ref 150–400)
RBC: 4.52 MIL/uL (ref 4.22–5.81)
RDW: 12.7 % (ref 11.5–15.5)
WBC: 7.6 10*3/uL (ref 4.0–10.5)

## 2017-08-16 LAB — COMPREHENSIVE METABOLIC PANEL
ALK PHOS: 74 U/L (ref 38–126)
ALT: 9 U/L — AB (ref 17–63)
AST: 20 U/L (ref 15–41)
Albumin: 4.2 g/dL (ref 3.5–5.0)
Anion gap: 12 (ref 5–15)
BILIRUBIN TOTAL: 0.7 mg/dL (ref 0.3–1.2)
BUN: 23 mg/dL — ABNORMAL HIGH (ref 6–20)
CALCIUM: 9.2 mg/dL (ref 8.9–10.3)
CO2: 22 mmol/L (ref 22–32)
CREATININE: 1.65 mg/dL — AB (ref 0.61–1.24)
Chloride: 105 mmol/L (ref 101–111)
GFR calc non Af Amer: 36 mL/min — ABNORMAL LOW (ref >60)
GFR, EST AFRICAN AMERICAN: 42 mL/min — AB (ref >60)
Glucose, Bld: 89 mg/dL (ref 65–99)
Potassium: 3.9 mmol/L (ref 3.5–5.1)
Sodium: 139 mmol/L (ref 135–145)
Total Protein: 7.1 g/dL (ref 6.5–8.1)

## 2017-08-16 LAB — I-STAT CG4 LACTIC ACID, ED: Lactic Acid, Venous: 2.18 mmol/L (ref 0.5–1.9)

## 2017-08-16 LAB — TROPONIN I

## 2017-08-16 MED ORDER — SODIUM CHLORIDE 0.9 % IV BOLUS (SEPSIS)
500.0000 mL | Freq: Once | INTRAVENOUS | Status: AC
  Administered 2017-08-16: 500 mL via INTRAVENOUS

## 2017-08-16 NOTE — ED Provider Notes (Signed)
[facility] PROGRESSIVE CARE Provider Note   CSN: [id number] Arrival date & time: 08/16/17  2119     History   Chief Complaint Chief Complaint  Patient presents with  . Near Syncope    HPI Ryan Humphrey is a 84 y.o. male.  HPI Patient presents by EMS for generalized weakness.  States he has had a cough for the last few days.  Also endorses chills.  Denies vomiting or diarrhea.  Patient is confused and history is limited. Past Medical History:  Diagnosis Date  . Anemia   . Arthritis    arthritis,osteopenia,"spinal stenosis"  . CAD (coronary artery disease)   . Cancer [facility]) 12-06-12   Prostate cancer'98  . Depression   . GERD (gastroesophageal reflux disease)   . H/O hiatal hernia   . H/O vertigo 12-06-12   none recent  . Hyperlipidemia   . IBS (irritable bowel syndrome)   . Neuropathy   . Osteopenia   . Peripheral neuropathy 12-06-12   peripheral neuropathy  . Rhinitis   . RLS (restless legs syndrome)   . Vitamin B12 deficiency     Patient Active Problem List   Diagnosis Date Noted  . Acute encephalopathy 08/17/2017  . Influenza A 08/17/2017  . CKD (chronic kidney disease) stage 3, GFR 30-59 ml/min (HCC) 08/17/2017  . Influenza 08/17/2017  . TIA (transient ischemic attack) 11/05/2016  . Vitamin B12 deficiency 11/05/2016  . GERD (gastroesophageal reflux disease) 11/05/2016  . MCI (mild cognitive impairment) 08/20/2015  . Hereditary and idiopathic peripheral neuropathy 08/20/2015  . Benign essential tremor 08/20/2015  . Vitamin D deficiency 01/06/2015  . Prediabetes 01/06/2015  . Medication management 01/06/2015  . Primary osteoarthritis of right knee 11/25/2014  . Bladder neck obstruction 03/28/2011  . Malignant neoplasm of prostate ([place]) 03/28/2011  . INTERMITTENT VERTIGO 10/12/2009  . Mixed hyperlipidemia 04/10/2009  . Essential hypertension, benign 04/10/2009  . CORONARY ATHEROSCLEROSIS NATIVE CORONARY ARTERY 04/10/2009    Past Surgical History:    Procedure Laterality Date  . APPENDECTOMY    . CARPAL TUNNEL RELEASE     both hands  . cataract surgery Left 12-06-12   recent surgery  . CHOLECYSTECTOMY    . COLONOSCOPY WITH PROPOFOL N/A 12/25/2012   Procedure: COLONOSCOPY WITH PROPOFOL;  Surgeon: Garlan Fair, MD;  Location: [facility];  Service: Endoscopy;  Laterality: N/A;  . ESOPHAGOGASTRODUODENOSCOPY (EGD) WITH PROPOFOL N/A 12/25/2012   Procedure: ESOPHAGOGASTRODUODENOSCOPY (EGD) WITH PROPOFOL;  Surgeon: Garlan Fair, MD;  Location: [facility];  Service: Endoscopy;  Laterality: N/A;  . PROSTATE SURGERY  12-06-12  . TONSILLECTOMY    . TOTAL KNEE ARTHROPLASTY Right 11/25/2014   Procedure: RIGHT TOTAL KNEE ARTHROPLASTY;  Surgeon: Melrose Nakayama, MD;  Location: [facility];  Service: Orthopedics;  Laterality: Right;       Home Medications    Prior to Admission medications   Medication Sig Start Date End Date Taking? Authorizing Provider  Cholecalciferol (VITAMIN D) 2000 UNITS tablet Take 8,000 Units by mouth daily.   Yes [provider]  gabapentin (NEURONTIN) 400 MG capsule Take 400 mg by mouth 3 (three) times daily.   Yes [provider]  ipratropium (ATROVENT) 0.03 % nasal spray Place 2 sprays into the nose 3 (three) times daily. Patient taking differently: Place 2 sprays into the nose 3 (three) times daily as needed for rhinitis.  11/10/16 11/10/17 Yes Vicie Mutters, PA-C  omeprazole (PRILOSEC) 40 MG capsule Take 1 capsule (40 mg total) by mouth every morning. Before  breakfast 04/05/17  Yes Willia Craze, NP  propranolol (INDERAL) 20 MG tablet Take 1 tablet (20 mg total) by mouth 2 (two) times daily. 10/14/15  Yes Unk Pinto, MD  sertraline (ZOLOFT) 100 MG tablet 1 pill daily for mood Patient taking differently: Take 100 mg by mouth daily.  02/15/17 02/15/18 Yes Vicie Mutters, PA-C  simvastatin (ZOCOR) 20 MG tablet Take 1 tablet (20 mg total) by mouth every evening. 10/14/15  Yes Unk Pinto, MD   oseltamivir (TAMIFLU) 30 MG capsule Take 1 capsule (30 mg total) by mouth 2 (two) times daily. 08/18/17   Marjie Skiff, MD    Family History Family History  Problem Relation Age of Onset  . CAD Father        Deceased, 38  . Dementia Mother        Deceased, 45  . Diabetes Brother   . Hyperlipidemia Brother   . Hypertension Brother   . Dementia Brother   . Colon cancer Neg Hx   . Colon polyps Neg Hx   . Kidney disease Neg Hx   . Esophageal cancer Neg Hx   . Gallbladder disease Neg Hx     Social History Social History   Tobacco Use  . Smoking status: Former Smoker    Packs/day: 0.50    Years: 30.00    Pack years: 15.00    Last attempt to quit: 06/14/1963    Years since quitting: 54.2  . Smokeless tobacco: Never Used  Substance Use Topics  . Alcohol use: Yes    Alcohol/week: 0.0 oz    Comment: Socially  . Drug use: No     Allergies   Niacin; Penicillins; and Tramadol   Review of Systems Review of Systems  Constitutional: Positive for chills and fatigue.  HENT: Negative for sore throat and trouble swallowing.   Respiratory: Positive for cough.   Cardiovascular: Negative for chest pain, palpitations and leg swelling.  Gastrointestinal: Negative for abdominal pain, diarrhea, nausea and vomiting.  Genitourinary: Negative for dysuria, flank pain and frequency.  Musculoskeletal: Negative for back pain, myalgias and neck pain.  Neurological: Positive for weakness. Negative for dizziness, numbness and headaches.  Psychiatric/Behavioral: Positive for confusion.  All other systems reviewed and are negative.    Physical Exam Updated Vital Signs BP 138/74 (BP Location: Right Arm)   Pulse 90   Temp 98.6 F (37 C) (Oral)   Resp 18   Ht 6' (1.829 m)   Wt 77.8 kg (171 lb 8.3 oz)   SpO2 98%   BMI 23.26 kg/m   Physical Exam  Constitutional: He appears well-developed and well-nourished.  HENT:  Head: Normocephalic and atraumatic.  Mouth/Throat: Oropharynx is  clear and moist.  No evidence of intra-or of scalp trauma.  Midface is stable.  No intraoral trauma.  Eyes: EOM are normal. Pupils are equal, round, and reactive to light.  Pupils are 2 mm bilaterally and sluggish.  No nystagmus.  Neck: Normal range of motion. Neck supple.  No posterior midline cervical tenderness to palpation.  No meningismus.  Cardiovascular: Normal rate and regular rhythm. Exam reveals no gallop and no friction rub.  No murmur heard. Pulmonary/Chest: Effort normal.  Few scattered rhonchi in bilateral bases.  Abdominal: Soft. Bowel sounds are normal. There is no tenderness. There is no rebound and no guarding.  Musculoskeletal: Normal range of motion. He exhibits no edema or tenderness.  No midline thoracic or lumbar tenderness.  No CVA tenderness.  No lower extremity swelling, asymmetry or  tenderness.  Distal pulses intact.  Neurological: He is alert.  Patient is oriented to self.  Confused with difficulties short-term memory difficulties.  5/5 motor in all extremities.  Sensation intact.  Skin: Skin is warm and dry. Capillary refill takes less than 2 seconds. No rash noted. No erythema.  Psychiatric: He has a normal mood and affect. His behavior is normal.  Nursing note and vitals reviewed.    ED Treatments / Results  Labs (all labs ordered are listed, but only abnormal results are displayed) Labs Reviewed  COMPREHENSIVE METABOLIC PANEL - Abnormal; Notable for the following components:      Result Value   BUN 23 (*)    Creatinine, Ser 1.65 (*)    ALT 9 (*)    GFR calc non Af Amer 36 (*)    GFR calc Af Amer 42 (*)    All other components within normal limits  CBC WITH DIFFERENTIAL/PLATELET - Abnormal; Notable for the following components:   Platelets 121 (*)    Lymphs Abs 0.4 (*)    All other components within normal limits  INFLUENZA PANEL BY PCR (TYPE A & B) - Abnormal; Notable for the following components:   Influenza A By PCR POSITIVE (*)    All other  components within normal limits  CREATININE, SERUM - Abnormal; Notable for the following components:   Creatinine, Ser 1.48 (*)    GFR calc non Af Amer 41 (*)    GFR calc Af Amer 48 (*)    All other components within normal limits  CBC - Abnormal; Notable for the following components:   RBC 3.86 (*)    Hemoglobin 12.5 (*)    HCT 37.3 (*)    Platelets 105 (*)    All other components within normal limits  CBC - Abnormal; Notable for the following components:   RBC 3.59 (*)    Hemoglobin 11.6 (*)    HCT 34.4 (*)    Platelets 93 (*)    All other components within normal limits  BASIC METABOLIC PANEL - Abnormal; Notable for the following components:   Chloride 112 (*)    Glucose, Bld 104 (*)    BUN 22 (*)    Creatinine, Ser 1.64 (*)    Calcium 8.1 (*)    GFR calc non Af Amer 36 (*)    GFR calc Af Amer 42 (*)    All other components within normal limits  I-STAT CG4 LACTIC ACID, ED - Abnormal; Notable for the following components:   Lactic Acid, Venous 2.18 (*)    All other components within normal limits  CULTURE, BLOOD (ROUTINE X 2)  CULTURE, BLOOD (ROUTINE X 2)  URINALYSIS, ROUTINE W REFLEX MICROSCOPIC  TROPONIN I  LACTIC ACID, PLASMA  I-STAT CG4 LACTIC ACID, ED    EKG  EKG Interpretation  Date/Time:  Wednesday August 16 2017 21:25:57 EST Ventricular Rate:  83 PR Interval:    QRS Duration: 137 QT Interval:  382 QTC Calculation: 449 R Axis:   67 Text Interpretation:  Sinus rhythm Atrial premature complex Right bundle branch block Abnormal ECG Interpretation limited secondary to artifact Confirmed by Ripley Fraise [phone]) on 08/17/2017 12:28:10 AM Also confirmed by Ripley Fraise [phone]), editor Hattie Perch ([id number])  on 08/17/2017 7:39:41 AM       Radiology No results found.  Procedures Procedures (including critical care time)  Medications Ordered in ED Medications  methylPREDNISolone sodium succinate (SOLU-MEDROL) 40 mg/mL injection 40 mg (40 mg  Intravenous Not Given  08/17/17 2000)  0.9 %  sodium chloride infusion ( Intravenous Stopped 08/18/17 0503)  0.9 %  sodium chloride infusion (not administered)  sodium chloride 0.9 % bolus 500 mL (0 mLs Intravenous Stopped 08/16/17 2350)  sodium chloride 0.9 % bolus 500 mL (0 mLs Intravenous Stopped 08/17/17 0132)  acetaminophen (TYLENOL) tablet 650 mg (650 mg Oral Given 08/17/17 0131)  oseltamivir (TAMIFLU) capsule 30 mg (30 mg Oral Given 08/17/17 0311)  sodium chloride 0.9 % bolus 500 mL (0 mLs Intravenous Stopped 08/17/17 1026)     Initial Impression / Assessment and Plan / ED Course  I have reviewed the triage vital signs and the nursing notes.  Pertinent labs & imaging results that were available during my care of the patient were reviewed by me and considered in my medical decision making (see chart for details).     Signed out to oncoming emergency physician pending reevaluation and disposition.  Final Clinical Impressions(s) / ED Diagnoses   Final diagnoses:  Influenza    ED Discharge Orders        Ordered    oseltamivir (TAMIFLU) 30 MG capsule  2 times daily     08/18/17 1036    Increase activity slowly     08/18/17 1036    Diet - low sodium heart healthy     08/18/17 1036    Discharge instructions    Comments:  Patient will take tamiflu 2 times (morning and evening) a day for the next three days. Patient had one dose today prior to discharge and will need his evening dose.   08/18/17 1036       Julianne Rice, MD 08/21/17 [phone]

## 2017-08-16 NOTE — ED Triage Notes (Signed)
Pt BIB [facility] from home with c/o near syncope and fall. Pt has had increased weakness in BLE x 5 days, reports some lightheadedness with standing. Cough x 2 days.

## 2017-08-16 NOTE — ED Notes (Signed)
Patient transported to CT 

## 2017-08-16 NOTE — ED Notes (Signed)
X-ray at bedside

## 2017-08-17 ENCOUNTER — Other Ambulatory Visit: Payer: Self-pay

## 2017-08-17 ENCOUNTER — Encounter (HOSPITAL_COMMUNITY): Payer: Self-pay

## 2017-08-17 DIAGNOSIS — K219 Gastro-esophageal reflux disease without esophagitis: Secondary | ICD-10-CM | POA: Diagnosis present

## 2017-08-17 DIAGNOSIS — G3184 Mild cognitive impairment, so stated: Secondary | ICD-10-CM

## 2017-08-17 DIAGNOSIS — N179 Acute kidney failure, unspecified: Secondary | ICD-10-CM | POA: Diagnosis present

## 2017-08-17 DIAGNOSIS — M8580 Other specified disorders of bone density and structure, unspecified site: Secondary | ICD-10-CM | POA: Diagnosis present

## 2017-08-17 DIAGNOSIS — J1081 Influenza due to other identified influenza virus with encephalopathy: Secondary | ICD-10-CM | POA: Diagnosis present

## 2017-08-17 DIAGNOSIS — F329 Major depressive disorder, single episode, unspecified: Secondary | ICD-10-CM | POA: Diagnosis present

## 2017-08-17 DIAGNOSIS — N183 Chronic kidney disease, stage 3 unspecified: Secondary | ICD-10-CM

## 2017-08-17 DIAGNOSIS — I959 Hypotension, unspecified: Secondary | ICD-10-CM | POA: Diagnosis present

## 2017-08-17 DIAGNOSIS — R402414 Glasgow coma scale score 13-15, 24 hours or more after hospital admission: Secondary | ICD-10-CM | POA: Diagnosis not present

## 2017-08-17 DIAGNOSIS — Z96651 Presence of right artificial knee joint: Secondary | ICD-10-CM | POA: Diagnosis present

## 2017-08-17 DIAGNOSIS — K589 Irritable bowel syndrome without diarrhea: Secondary | ICD-10-CM | POA: Diagnosis present

## 2017-08-17 DIAGNOSIS — I10 Essential (primary) hypertension: Secondary | ICD-10-CM

## 2017-08-17 DIAGNOSIS — J101 Influenza due to other identified influenza virus with other respiratory manifestations: Secondary | ICD-10-CM | POA: Diagnosis not present

## 2017-08-17 DIAGNOSIS — G9340 Encephalopathy, unspecified: Secondary | ICD-10-CM | POA: Diagnosis present

## 2017-08-17 DIAGNOSIS — F0280 Dementia in other diseases classified elsewhere without behavioral disturbance: Secondary | ICD-10-CM | POA: Diagnosis present

## 2017-08-17 DIAGNOSIS — I4510 Unspecified right bundle-branch block: Secondary | ICD-10-CM | POA: Diagnosis present

## 2017-08-17 DIAGNOSIS — E785 Hyperlipidemia, unspecified: Secondary | ICD-10-CM | POA: Diagnosis present

## 2017-08-17 DIAGNOSIS — R2681 Unsteadiness on feet: Secondary | ICD-10-CM | POA: Diagnosis present

## 2017-08-17 DIAGNOSIS — G2581 Restless legs syndrome: Secondary | ICD-10-CM | POA: Diagnosis present

## 2017-08-17 DIAGNOSIS — M1990 Unspecified osteoarthritis, unspecified site: Secondary | ICD-10-CM | POA: Diagnosis present

## 2017-08-17 DIAGNOSIS — I129 Hypertensive chronic kidney disease with stage 1 through stage 4 chronic kidney disease, or unspecified chronic kidney disease: Secondary | ICD-10-CM | POA: Diagnosis present

## 2017-08-17 DIAGNOSIS — M4800 Spinal stenosis, site unspecified: Secondary | ICD-10-CM | POA: Diagnosis present

## 2017-08-17 DIAGNOSIS — F0390 Unspecified dementia without behavioral disturbance: Secondary | ICD-10-CM | POA: Diagnosis present

## 2017-08-17 DIAGNOSIS — I2510 Atherosclerotic heart disease of native coronary artery without angina pectoris: Secondary | ICD-10-CM | POA: Diagnosis present

## 2017-08-17 DIAGNOSIS — J111 Influenza due to unidentified influenza virus with other respiratory manifestations: Secondary | ICD-10-CM | POA: Diagnosis not present

## 2017-08-17 DIAGNOSIS — I491 Atrial premature depolarization: Secondary | ICD-10-CM | POA: Diagnosis present

## 2017-08-17 DIAGNOSIS — G629 Polyneuropathy, unspecified: Secondary | ICD-10-CM | POA: Diagnosis present

## 2017-08-17 DIAGNOSIS — E559 Vitamin D deficiency, unspecified: Secondary | ICD-10-CM | POA: Diagnosis present

## 2017-08-17 HISTORY — DX: Chronic kidney disease, stage 3 unspecified: N18.30

## 2017-08-17 LAB — URINALYSIS, ROUTINE W REFLEX MICROSCOPIC
BILIRUBIN URINE: NEGATIVE
Glucose, UA: NEGATIVE mg/dL
Hgb urine dipstick: NEGATIVE
Ketones, ur: NEGATIVE mg/dL
Leukocytes, UA: NEGATIVE
NITRITE: NEGATIVE
PROTEIN: NEGATIVE mg/dL
Specific Gravity, Urine: 1.018 (ref 1.005–1.030)
pH: 5 (ref 5.0–8.0)

## 2017-08-17 LAB — CBC
HEMATOCRIT: 37.3 % — AB (ref 39.0–52.0)
Hemoglobin: 12.5 g/dL — ABNORMAL LOW (ref 13.0–17.0)
MCH: 32.4 pg (ref 26.0–34.0)
MCHC: 33.5 g/dL (ref 30.0–36.0)
MCV: 96.6 fL (ref 78.0–100.0)
Platelets: 105 10*3/uL — ABNORMAL LOW (ref 150–400)
RBC: 3.86 MIL/uL — ABNORMAL LOW (ref 4.22–5.81)
RDW: 13.2 % (ref 11.5–15.5)
WBC: 5 10*3/uL (ref 4.0–10.5)

## 2017-08-17 LAB — INFLUENZA PANEL BY PCR (TYPE A & B)
INFLAPCR: POSITIVE — AB
INFLBPCR: NEGATIVE

## 2017-08-17 LAB — CREATININE, SERUM
Creatinine, Ser: 1.48 mg/dL — ABNORMAL HIGH (ref 0.61–1.24)
GFR calc Af Amer: 48 mL/min — ABNORMAL LOW (ref >60)
GFR calc non Af Amer: 41 mL/min — ABNORMAL LOW (ref >60)

## 2017-08-17 LAB — LACTIC ACID, PLASMA: Lactic Acid, Venous: 1 mmol/L (ref 0.5–1.9)

## 2017-08-17 MED ORDER — SODIUM CHLORIDE 0.9 % IV BOLUS (SEPSIS)
500.0000 mL | Freq: Once | INTRAVENOUS | Status: AC
  Administered 2017-08-17: 500 mL via INTRAVENOUS

## 2017-08-17 MED ORDER — ENOXAPARIN SODIUM 40 MG/0.4ML ~~LOC~~ SOLN: 40.0000 mg | SUBCUTANEOUS | Status: DC

## 2017-08-17 MED ORDER — OSELTAMIVIR PHOSPHATE 30 MG PO CAPS
30.0000 mg | ORAL_CAPSULE | Freq: Two times a day (BID) | ORAL | Status: DC
  Administered 2017-08-17 – 2017-08-18 (×3): 30 mg via ORAL
  Filled 2017-08-17 (×4): qty 1

## 2017-08-17 MED ORDER — ACETAMINOPHEN 650 MG RE SUPP: 650.0000 mg | Freq: Four times a day (QID) | RECTAL | Status: DC | PRN

## 2017-08-17 MED ORDER — ONDANSETRON HCL 4 MG PO TABS: 4.0000 mg | ORAL_TABLET | Freq: Four times a day (QID) | ORAL | Status: DC | PRN

## 2017-08-17 MED ORDER — SIMVASTATIN 20 MG PO TABS
20.0000 mg | ORAL_TABLET | Freq: Every evening | ORAL | Status: DC
  Administered 2017-08-17: 20 mg via ORAL
  Filled 2017-08-17: qty 1

## 2017-08-17 MED ORDER — PANTOPRAZOLE SODIUM 40 MG PO TBEC
40.0000 mg | DELAYED_RELEASE_TABLET | Freq: Every day | ORAL | Status: DC
  Administered 2017-08-17 – 2017-08-18 (×2): 40 mg via ORAL
  Filled 2017-08-17 (×2): qty 1

## 2017-08-17 MED ORDER — ALBUTEROL SULFATE (2.5 MG/3ML) 0.083% IN NEBU: 2.5000 mg | INHALATION_SOLUTION | Freq: Four times a day (QID) | RESPIRATORY_TRACT | Status: DC | PRN

## 2017-08-17 MED ORDER — HEPARIN SODIUM (PORCINE) 5000 UNIT/ML IJ SOLN
5000.0000 [IU] | Freq: Three times a day (TID) | INTRAMUSCULAR | Status: DC
  Administered 2017-08-17 – 2017-08-18 (×3): 5000 [IU] via SUBCUTANEOUS
  Filled 2017-08-17 (×3): qty 1

## 2017-08-17 MED ORDER — VITAMIN D 1000 UNITS PO TABS
8000.0000 [IU] | ORAL_TABLET | Freq: Every day | ORAL | Status: DC
  Administered 2017-08-17 – 2017-08-18 (×2): 8000 [IU] via ORAL
  Filled 2017-08-17 (×2): qty 8

## 2017-08-17 MED ORDER — ACETAMINOPHEN 325 MG PO TABS
650.0000 mg | ORAL_TABLET | Freq: Once | ORAL | Status: AC
  Administered 2017-08-17: 650 mg via ORAL
  Filled 2017-08-17: qty 2

## 2017-08-17 MED ORDER — SODIUM CHLORIDE 0.9 % IV SOLN
INTRAVENOUS | Status: AC
  Administered 2017-08-17: 19:00:00 via INTRAVENOUS

## 2017-08-17 MED ORDER — OSELTAMIVIR PHOSPHATE 30 MG PO CAPS
30.0000 mg | ORAL_CAPSULE | Freq: Once | ORAL | Status: AC
  Administered 2017-08-17: 30 mg via ORAL
  Filled 2017-08-17: qty 1

## 2017-08-17 MED ORDER — METHYLPREDNISOLONE SODIUM SUCC 40 MG IJ SOLR
40.0000 mg | Freq: Two times a day (BID) | INTRAMUSCULAR | Status: AC
  Administered 2017-08-17: 40 mg via INTRAVENOUS
  Filled 2017-08-17: qty 1

## 2017-08-17 MED ORDER — ACETAMINOPHEN 325 MG PO TABS: 650.0000 mg | ORAL_TABLET | Freq: Four times a day (QID) | ORAL | Status: DC | PRN

## 2017-08-17 MED ORDER — ONDANSETRON HCL 4 MG/2ML IJ SOLN: 4.0000 mg | Freq: Four times a day (QID) | INTRAMUSCULAR | Status: DC | PRN

## 2017-08-17 MED ORDER — GUAIFENESIN-DM 100-10 MG/5ML PO SYRP
10.0000 mL | ORAL_SOLUTION | Freq: Four times a day (QID) | ORAL | Status: DC
  Administered 2017-08-17 – 2017-08-18 (×4): 10 mL via ORAL
  Filled 2017-08-17 (×4): qty 10

## 2017-08-17 MED ORDER — SERTRALINE HCL 100 MG PO TABS
100.0000 mg | ORAL_TABLET | Freq: Every day | ORAL | Status: DC
  Administered 2017-08-17 – 2017-08-18 (×2): 100 mg via ORAL
  Filled 2017-08-17 (×2): qty 1

## 2017-08-17 MED ORDER — SODIUM CHLORIDE 0.9 % IV SOLN
INTRAVENOUS | Status: DC
  Administered 2017-08-17 (×2): via INTRAVENOUS

## 2017-08-17 MED ORDER — PROPRANOLOL HCL 20 MG PO TABS: 20.0000 mg | ORAL_TABLET | Freq: Two times a day (BID) | ORAL | Status: DC

## 2017-08-17 MED ORDER — IPRATROPIUM BROMIDE 0.03 % NA SOLN: 2.0000 | Freq: Three times a day (TID) | NASAL | Status: DC | PRN

## 2017-08-17 MED ORDER — GABAPENTIN 400 MG PO CAPS
400.0000 mg | ORAL_CAPSULE | Freq: Three times a day (TID) | ORAL | Status: DC
  Administered 2017-08-17 – 2017-08-18 (×4): 400 mg via ORAL
  Filled 2017-08-17 (×4): qty 1

## 2017-08-17 MED ORDER — PREDNISONE 20 MG PO TABS
40.0000 mg | ORAL_TABLET | Freq: Every day | ORAL | Status: DC
  Administered 2017-08-18: 40 mg via ORAL
  Filled 2017-08-17: qty 2

## 2017-08-17 NOTE — ED Provider Notes (Signed)
EKG Interpretation  Date/Time:  Wednesday August 16 2017 21:25:57 EST Ventricular Rate:  83 PR Interval:    QRS Duration: 137 QT Interval:  382 QTC Calculation: 449 R Axis:   67 Text Interpretation:  Sinus rhythm Atrial premature complex Right bundle branch block Abnormal ECG Interpretation limited secondary to artifact Confirmed by Ripley Fraise [phone]) on 08/17/2017 12:28:10 AM         Ripley Fraise, MD 08/17/17 [phone]

## 2017-08-17 NOTE — ED Notes (Signed)
This tech and [employer] representative ambulated pt around the pod. Pt with unsteady gait and kind of veering to the right side. Pt also stumbled over feet while walking. Pt also confused. When asked pt who would be able to come to the hospital to pick him up to go home Pt stated that wife is out of town and that his daughter has the flu. Pt's wife is deceased.

## 2017-08-17 NOTE — H&P (Signed)
History and Physical    Ryan Humphrey:[id number] DOB: 06/17/1928 DOA: 08/16/2017  PCP: Unk Pinto, MD  Patient coming from: Home  I have personally briefly reviewed patient's old medical records in [place]  Chief Complaint: Generalized weakness  HPI: Ryan Humphrey is a 84 y.o. male with medical history significant of CAD, MCI, HTN.  Patient presents to the ED for generalized weakness.  Has had cough for last few days.  Fever and chills at home.  No vomiting nor diarrhea.   ED Course: In the ED patient was mildly confused, unsteady gait.  CT head neg.  Running fever 101.4.  Coughing noted during ed stay.  Influenza A positive.   Review of Systems: As per HPI otherwise 10 point review of systems negative.   Past Medical History:  Diagnosis Date  . Anemia   . Arthritis    arthritis,osteopenia,"spinal stenosis"  . CAD (coronary artery disease)   . Cancer [facility]) 12-06-12   Prostate cancer'98  . Depression   . GERD (gastroesophageal reflux disease)   . H/O hiatal hernia   . H/O vertigo 12-06-12   none recent  . Hyperlipidemia   . IBS (irritable bowel syndrome)   . Neuropathy   . Osteopenia   . Peripheral neuropathy 12-06-12   peripheral neuropathy  . Rhinitis   . RLS (restless legs syndrome)   . Vitamin B12 deficiency     Past Surgical History:  Procedure Laterality Date  . APPENDECTOMY    . CARPAL TUNNEL RELEASE     both hands  . cataract surgery Left 12-06-12   recent surgery  . CHOLECYSTECTOMY    . COLONOSCOPY WITH PROPOFOL N/A 12/25/2012   Procedure: COLONOSCOPY WITH PROPOFOL;  Surgeon: Garlan Fair, MD;  Location: [facility];  Service: Endoscopy;  Laterality: N/A;  . ESOPHAGOGASTRODUODENOSCOPY (EGD) WITH PROPOFOL N/A 12/25/2012   Procedure: ESOPHAGOGASTRODUODENOSCOPY (EGD) WITH PROPOFOL;  Surgeon: Garlan Fair, MD;  Location: [facility];  Service: Endoscopy;  Laterality: N/A;  . PROSTATE SURGERY  12-06-12  . TONSILLECTOMY    . TOTAL  KNEE ARTHROPLASTY Right 11/25/2014   Procedure: RIGHT TOTAL KNEE ARTHROPLASTY;  Surgeon: Melrose Nakayama, MD;  Location: [place];  Service: Orthopedics;  Laterality: Right;     reports that he quit smoking about 54 years ago. He has a 15.00 pack-year smoking history. he has never used smokeless tobacco. He reports that he drinks alcohol. He reports that he does not use drugs.  Allergies  Allergen Reactions  . Niacin Itching  . Penicillins Other (See Comments)    "rash"  . Tramadol Itching    Family History  Problem Relation Age of Onset  . CAD Father        Deceased, 77  . Dementia Mother        Deceased, 68  . Diabetes Brother   . Hyperlipidemia Brother   . Hypertension Brother   . Dementia Brother   . Colon cancer Neg Hx   . Colon polyps Neg Hx   . Kidney disease Neg Hx   . Esophageal cancer Neg Hx   . Gallbladder disease Neg Hx      Prior to Admission medications   Medication Sig Start Date End Date Taking? Authorizing Provider  Cholecalciferol (VITAMIN D) 2000 UNITS tablet Take 8,000 Units by mouth daily.   Yes [provider]  gabapentin (NEURONTIN) 400 MG capsule Take 400 mg by mouth 3 (three) times daily.   Yes [provider]  ipratropium (  ATROVENT) 0.03 % nasal spray Place 2 sprays into the nose 3 (three) times daily. Patient taking differently: Place 2 sprays into the nose 3 (three) times daily as needed for rhinitis.  11/10/16 11/10/17 Yes Vicie Mutters, PA-C  omeprazole (PRILOSEC) 40 MG capsule Take 1 capsule (40 mg total) by mouth every morning. Before breakfast 04/05/17  Yes Willia Craze, NP  propranolol (INDERAL) 20 MG tablet Take 1 tablet (20 mg total) by mouth 2 (two) times daily. 10/14/15  Yes Unk Pinto, MD  sertraline (ZOLOFT) 100 MG tablet 1 pill daily for mood Patient taking differently: Take 100 mg by mouth daily.  02/15/17 02/15/18 Yes Vicie Mutters, PA-C  simvastatin (ZOCOR) 20 MG tablet Take 1 tablet (20 mg total) by mouth every  evening. 10/14/15  Yes Unk Pinto, MD    Physical Exam: Vitals:   08/17/17 0100 08/17/17 0215 08/17/17 0240 08/17/17 0245  BP: (!) 121/52 (!) 116/52  (!) 105/53  Pulse: 76 78  76  Resp: 16 14  14   Temp:   99.7 F (37.6 C)   TempSrc:   Oral   SpO2: 97% 94%  94%    Constitutional: NAD, calm, comfortable Eyes: PERRL, lids and conjunctivae normal ENMT: Mucous membranes are moist. Posterior pharynx clear of any exudate or lesions.Normal dentition.  Neck: normal, supple, no masses, no thyromegaly Respiratory: clear to auscultation bilaterally, no wheezing, no crackles. Normal respiratory effort. No accessory muscle use.  Cardiovascular: Regular rate and rhythm, no murmurs / rubs / gallops. No extremity edema. 2+ pedal pulses. No carotid bruits.  Abdomen: no tenderness, no masses palpated. No hepatosplenomegaly. Bowel sounds positive.  Musculoskeletal: no clubbing / cyanosis. No joint deformity upper and lower extremities. Good ROM, no contractures. Normal muscle tone.  Skin: no rashes, lesions, ulcers. No induration Neurologic: CN 2-12 grossly intact. Sensation intact, DTR normal. Strength 5/5 in all 4.  Psychiatric: Normal judgment and insight. Alert and oriented x 3. Normal mood.    Labs on Admission: I have personally reviewed following labs and imaging studies  CBC: Recent Labs  Lab 08/16/17 2242  WBC 7.6  NEUTROABS 6.3  HGB 14.7  HCT 43.1  MCV 95.4  PLT 967*   Basic Metabolic Panel: Recent Labs  Lab 08/16/17 2242  NA 139  K 3.9  CL 105  CO2 22  GLUCOSE 89  BUN 23*  CREATININE 1.65*  CALCIUM 9.2   GFR: CrCl cannot be calculated (Unknown ideal weight.). Liver Function Tests: Recent Labs  Lab 08/16/17 2242  AST 20  ALT 9*  ALKPHOS 74  BILITOT 0.7  PROT 7.1  ALBUMIN 4.2   No results for input(s): LIPASE, AMYLASE in the last 168 hours. No results for input(s): AMMONIA in the last 168 hours. Coagulation Profile: No results for input(s): INR, PROTIME  in the last 168 hours. Cardiac Enzymes: Recent Labs  Lab 08/16/17 2242  TROPONINI <0.03   BNP (last 3 results) No results for input(s): PROBNP in the last 8760 hours. HbA1C: No results for input(s): HGBA1C in the last 72 hours. CBG: No results for input(s): GLUCAP in the last 168 hours. Lipid Profile: No results for input(s): CHOL, HDL, LDLCALC, TRIG, CHOLHDL, LDLDIRECT in the last 72 hours. Thyroid Function Tests: No results for input(s): TSH, T4TOTAL, FREET4, T3FREE, THYROIDAB in the last 72 hours. Anemia Panel: No results for input(s): VITAMINB12, FOLATE, FERRITIN, TIBC, IRON, RETICCTPCT in the last 72 hours. Urine analysis:    Component Value Date/Time   COLORURINE YELLOW 08/17/2017 0009  APPEARANCEUR CLEAR 08/17/2017 0009   LABSPEC 1.018 08/17/2017 0009   PHURINE 5.0 08/17/2017 0009   GLUCOSEU NEGATIVE 08/17/2017 0009   HGBUR NEGATIVE 08/17/2017 0009   BILIRUBINUR NEGATIVE 08/17/2017 0009   KETONESUR NEGATIVE 08/17/2017 0009   PROTEINUR NEGATIVE 08/17/2017 0009   UROBILINOGEN 0.2 01/03/2015 1330   NITRITE NEGATIVE 08/17/2017 0009   LEUKOCYTESUR NEGATIVE 08/17/2017 0009    Radiological Exams on Admission: Ct Head Wo Contrast  Result Date: 08/16/2017 CLINICAL DATA:  Initial evaluation for near syncope, fall. EXAM: CT HEAD WITHOUT CONTRAST CT CERVICAL SPINE WITHOUT CONTRAST TECHNIQUE: Multidetector CT imaging of the head and cervical spine was performed following the standard protocol without intravenous contrast. Multiplanar CT image reconstructions of the cervical spine were also generated. COMPARISON:  Prior MRI from 11/05/2016. FINDINGS: CT HEAD FINDINGS Brain: Age-related cerebral volume loss with chronic small vessel ischemic disease. No acute intracranial hemorrhage. No acute large vessel territory infarct. No mass lesion, midline shift or mass effect. Diffuse ventricular prominence related global parenchymal volume loss without hydrocephalus. No extra-axial fluid  collection. Vascular: No hyperdense vessel. Scattered vascular calcifications noted within the carotid siphons. Skull: Scalp soft tissues and calvarium within normal limits. Sinuses/Orbits: Globes and orbital soft tissues demonstrate no acute abnormality. Mild scattered mucosal thickening within the ethmoidal air cells. Paranasal sinuses are otherwise clear. No mastoid effusion. Other: None CT CERVICAL SPINE FINDINGS Alignment: Trace anterolisthesis of C5 on C6 due to chronic facet degeneration, stable. Vertebral bodies otherwise normally aligned with preservation of the normal cervical lordosis. Skull base and vertebrae: Skull base intact. Normal C1-2 articulations are preserved in the dens is intact. Vertebral body heights maintained. C6 and C7 vertebral bodies are largely ankylosed. No acute fracture. Few scattered sclerotic lesions noted, similar to previous, which may reflect benign bone islands or be related to remote history of prostate cancer. Soft tissues and spinal canal: No acute soft tissue abnormality within the neck. No abnormal prevertebral edema. Spinal canal within normal limits. Carotid bifurcation atherosclerosis noted. Disc levels: Moderate multilevel degenerative spondylolysis, most notable at C5-6. Prominent right-sided facet arthrosis within the upper cervical spine. Advanced degenerative changes about the C1-2 articulation. Upper chest: Visualized upper chest demonstrates no acute abnormality. Few small subcentimeter hypodense nodules noted within the thyroid, of doubtful significance. No apical pneumothorax. 4 mm nodule noted at the left lung apex, indeterminate (series 8, image 76). Other: None. IMPRESSION: CT BRAIN: 1. No acute intracranial process identified. 2. Generalized age-related cerebral atrophy with chronic small vessel ischemic disease. CT CERVICAL SPINE: 1. No acute traumatic injury within the cervical spine. 2. Moderate to advanced multilevel degenerative spondylolysis with  facet arthrosis, most notable at C5-6. 3. 4 mm nodule at the left lung apex, indeterminate. No follow-up needed if patient is low-risk. Non-contrast chest CT can be considered in 12 months if patient is high-risk. This recommendation follows the consensus statement: Guidelines for Management of Incidental Pulmonary Nodules Detected on CT Images: From the Fleischner Society 2017; Radiology 2017; 284:228-243. Electronically Signed   By: Jeannine Boga M.D.   On: 08/16/2017 23:59   Ct Cervical Spine Wo Contrast  Result Date: 08/16/2017 CLINICAL DATA:  Initial evaluation for near syncope, fall. EXAM: CT HEAD WITHOUT CONTRAST CT CERVICAL SPINE WITHOUT CONTRAST TECHNIQUE: Multidetector CT imaging of the head and cervical spine was performed following the standard protocol without intravenous contrast. Multiplanar CT image reconstructions of the cervical spine were also generated. COMPARISON:  Prior MRI from 11/05/2016. FINDINGS: CT HEAD FINDINGS Brain: Age-related cerebral volume loss with  chronic small vessel ischemic disease. No acute intracranial hemorrhage. No acute large vessel territory infarct. No mass lesion, midline shift or mass effect. Diffuse ventricular prominence related global parenchymal volume loss without hydrocephalus. No extra-axial fluid collection. Vascular: No hyperdense vessel. Scattered vascular calcifications noted within the carotid siphons. Skull: Scalp soft tissues and calvarium within normal limits. Sinuses/Orbits: Globes and orbital soft tissues demonstrate no acute abnormality. Mild scattered mucosal thickening within the ethmoidal air cells. Paranasal sinuses are otherwise clear. No mastoid effusion. Other: None CT CERVICAL SPINE FINDINGS Alignment: Trace anterolisthesis of C5 on C6 due to chronic facet degeneration, stable. Vertebral bodies otherwise normally aligned with preservation of the normal cervical lordosis. Skull base and vertebrae: Skull base intact. Normal C1-2  articulations are preserved in the dens is intact. Vertebral body heights maintained. C6 and C7 vertebral bodies are largely ankylosed. No acute fracture. Few scattered sclerotic lesions noted, similar to previous, which may reflect benign bone islands or be related to remote history of prostate cancer. Soft tissues and spinal canal: No acute soft tissue abnormality within the neck. No abnormal prevertebral edema. Spinal canal within normal limits. Carotid bifurcation atherosclerosis noted. Disc levels: Moderate multilevel degenerative spondylolysis, most notable at C5-6. Prominent right-sided facet arthrosis within the upper cervical spine. Advanced degenerative changes about the C1-2 articulation. Upper chest: Visualized upper chest demonstrates no acute abnormality. Few small subcentimeter hypodense nodules noted within the thyroid, of doubtful significance. No apical pneumothorax. 4 mm nodule noted at the left lung apex, indeterminate (series 8, image 76). Other: None. IMPRESSION: CT BRAIN: 1. No acute intracranial process identified. 2. Generalized age-related cerebral atrophy with chronic small vessel ischemic disease. CT CERVICAL SPINE: 1. No acute traumatic injury within the cervical spine. 2. Moderate to advanced multilevel degenerative spondylolysis with facet arthrosis, most notable at C5-6. 3. 4 mm nodule at the left lung apex, indeterminate. No follow-up needed if patient is low-risk. Non-contrast chest CT can be considered in 12 months if patient is high-risk. This recommendation follows the consensus statement: Guidelines for Management of Incidental Pulmonary Nodules Detected on CT Images: From the Fleischner Society 2017; Radiology 2017; 284:228-243. Electronically Signed   By: Jeannine Boga M.D.   On: 08/16/2017 23:59   Dg Chest Port 1 View  Result Date: 08/16/2017 CLINICAL DATA:  Cough EXAM: PORTABLE CHEST 1 VIEW COMPARISON:  10/11/2016 FINDINGS: Linear atelectasis or scarring at the  left base. No focal pulmonary opacity or pleural effusion. Normal cardiomediastinal silhouette with aortic atherosclerosis. No pneumothorax. IMPRESSION: No active disease.  Linear scarring or atelectasis at the left base Electronically Signed   By: Donavan Foil M.D.   On: 08/16/2017 22:17    EKG: Independently reviewed.  Assessment/Plan Principal Problem:   Influenza A Active Problems:   Essential hypertension, benign   MCI (mild cognitive impairment)   Acute encephalopathy   CKD (chronic kidney disease) stage 3, GFR 30-59 ml/min (HCC)    1. Acute encephalopathy - likely delirium secondary to influenza A 2. Influenza A - 1. Tamiflu 30mg  BID 2. IVF: NS at 100 cc/hr 3. Tylenol PRN fever 3. HTN - Holding propanolol as his BP is running borderline here in the ED 105/53. 4. CKD stage 3 - chronic and close to baseline with creat 1.6.  DVT prophylaxis: Lovenox Code Status: Full Family Communication: No family in room Disposition Plan: Home after admit Consults called: None Admission status: Place in obs - may need to convert into inpatient if encephalopathy persists   GARDNER, JARED M. DO [facility] Pager [phone]  If 7AM-7PM, please contact day team taking care of patient [email or web address] Password [facility]  08/17/2017, 3:27 AM

## 2017-08-17 NOTE — ED Provider Notes (Signed)
I assumed care at sign out to follow-up on labs.  Patient is noted to have influenza.  His fevers improved, he is not septic appearing, but is still mildly confused.  No meningeal signs.  He was very unsteady with ambulating.  He has been coughing in the emergency department.  Due to his age I am concerned that he may deteriorate at home Tamiflu ordered.  Discussed with hospitalist Dr. Alcario Drought for admission   Ripley Fraise, MD 08/17/17 [phone]

## 2017-08-17 NOTE — Progress Notes (Signed)
   08/17/17 1818  Pressure Injury Prevention  Positioning Frequency Able to turn self  Mobility  Activity Ambulated in hall  Range of Motion Active;All extremities  Level of Assistance Modified independent, requires aide device or extra time  Assistive Device Standard walker  Minutes Ambulated 5 minutes  Distance Ambulated (ft) 250 ft  Mobility Response Tolerated well     Patient ambulated in hall with staffs at this time. Pt denied any SOB or distress. Patient tolerated ambulation well. Will continue to monitor.  Ave Filter, RN

## 2017-08-17 NOTE — Progress Notes (Signed)
Family Medicine Teaching Service Daily Progress Note Intern Pager: [phone]  Patient name: Ryan Humphrey Medical record number: [id number] Date of birth: Jan 31, 1929 Age: 84 y.o. Gender: male  Primary Care Provider: Unk Pinto, MD Consultants: None Code Status: Full   Pt Overview and Major Events to Date:  Admitted on 3/7  Assessment and Plan: Patient is 84 y.o. male with medical history significant of CAD, MCI, HTN who presented to the ED with fever, cough, generalized weakness and mild confusion and found to be flu positive.   #Altered mental status, acute, improving  Patient presented with mild confusion. CT Head was negative for any acute intracranial process. CXR was also negative for any acute infectious process. UA was unremarkable. AMS likely secondary to flu. LA 2.18>>1.0. Patient does have a history of mild dementia at baseline. --Continue tamiflu 30 mg bid --Acetaminophen 650 mg q6 prn --Zofran 4 mg q6 prn  --Continue to monitor  # Influenza A positive Patient respiratory status has improved, and vitals are within normal limits. Lactic acid is down trending. No cough noted this am. --Continue tamiflu renal dosing --Continue neb q6 prn  --Continue atrovent  --Continue solumedrol 40 bid, will transition to prednisone 40 daily --Continue guaifenesin/dextromethorphan every 6 hours as needed --Monitor respiratory status  --O2 therapy prn --Follow up on am CBC  #Acute on chronic kidney disease III, improving Patient has a history of CKD-3. On admission, creat 1.65 ( baseline ~1.3) likely 2/2 to poor po fluid intake with AMS  In the setting of flu. Patient also had a LA of 2.18 on admission, 1.0 this am Overnight IVF received, creatinine is down trending. Patient is taking PO. --Will continue gentle IVF 75 cc/hr, --Follow up on am BMP  #HTN On admission BP was 105/53, has improved mildly overnight with gentle fluid. Will continue to hold hypertensive meds given  current BP 114/55. --Hold propanolol 20 mg daily, resume as needed  #Hyperlipidemia Continue simvastatin 20 mg daily   #Vit D deficiency  Continue Cholecalciferol 8000 units daily  #Mild dementia --Continue sertraline 100 mg daily   FEN/GI: Heart healthy diet PPx: Protonix, heparin  Disposition: Home pending clinical improvement  Subjective:  Patient is feeling better this morning, taking po.  Patient is alert and oriented x4.  Patient reports that breathing has improved.  No acute complaint.  Objective: Temp:  [98.5 F (36.9 C)-101.4 F (38.6 C)] 98.5 F (36.9 C) (03/07 0445) Pulse Rate:  [74-86] 75 (03/07 0445) Resp:  [13-18] 16 (03/07 0445) BP: (105-144)/(46-101) 110/46 (03/07 0445) SpO2:  [93 %-100 %] 98 % (03/07 0445) Weight:  [171 lb 8.3 oz (77.8 kg)] 171 lb 8.3 oz (77.8 kg) (03/07 0445)  General: Older gentleman, NAD, pleasant, able to participate in exam Cardiac: RRR, normal heart sounds, no murmurs. 2+ radial and PT pulses bilaterally Respiratory: CTAB, normal effort, No wheezes, rales or rhonchi Abdomen: soft, nontender, nondistended, no hepatic or splenomegaly, +BS Extremities: no edema or cyanosis. WWP. Skin: warm and dry, no rashes noted Neuro: alert and oriented x4, no focal deficits Psych: Normal affect and mood  Laboratory: Recent Labs  Lab 08/16/17 2242  WBC 7.6  HGB 14.7  HCT 43.1  PLT 121*   Recent Labs  Lab 08/16/17 2242  NA 139  K 3.9  CL 105  CO2 22  BUN 23*  CREATININE 1.65*  CALCIUM 9.2  PROT 7.1  BILITOT 0.7  ALKPHOS 74  ALT 9*  AST 20  GLUCOSE 89   LA 2.18 >>1.0  Trop <0.03 Influenza +  Imaging/Diagnostic Tests: Ct Head Wo Contrast  Result Date: 08/16/2017 CLINICAL DATA:  Initial evaluation for near syncope, fall. EXAM: CT HEAD WITHOUT CONTRAST CT CERVICAL SPINE WITHOUT CONTRAST TECHNIQUE: Multidetector CT imaging of the head and cervical spine was performed following the standard protocol without intravenous contrast.  Multiplanar CT image reconstructions of the cervical spine were also generated. COMPARISON:  Prior MRI from 11/05/2016. FINDINGS: CT HEAD FINDINGS Brain: Age-related cerebral volume loss with chronic small vessel ischemic disease. No acute intracranial hemorrhage. No acute large vessel territory infarct. No mass lesion, midline shift or mass effect. Diffuse ventricular prominence related global parenchymal volume loss without hydrocephalus. No extra-axial fluid collection. Vascular: No hyperdense vessel. Scattered vascular calcifications noted within the carotid siphons. Skull: Scalp soft tissues and calvarium within normal limits. Sinuses/Orbits: Globes and orbital soft tissues demonstrate no acute abnormality. Mild scattered mucosal thickening within the ethmoidal air cells. Paranasal sinuses are otherwise clear. No mastoid effusion. Other: None CT CERVICAL SPINE FINDINGS Alignment: Trace anterolisthesis of C5 on C6 due to chronic facet degeneration, stable. Vertebral bodies otherwise normally aligned with preservation of the normal cervical lordosis. Skull base and vertebrae: Skull base intact. Normal C1-2 articulations are preserved in the dens is intact. Vertebral body heights maintained. C6 and C7 vertebral bodies are largely ankylosed. No acute fracture. Few scattered sclerotic lesions noted, similar to previous, which may reflect benign bone islands or be related to remote history of prostate cancer. Soft tissues and spinal canal: No acute soft tissue abnormality within the neck. No abnormal prevertebral edema. Spinal canal within normal limits. Carotid bifurcation atherosclerosis noted. Disc levels: Moderate multilevel degenerative spondylolysis, most notable at C5-6. Prominent right-sided facet arthrosis within the upper cervical spine. Advanced degenerative changes about the C1-2 articulation. Upper chest: Visualized upper chest demonstrates no acute abnormality. Few small subcentimeter hypodense nodules  noted within the thyroid, of doubtful significance. No apical pneumothorax. 4 mm nodule noted at the left lung apex, indeterminate (series 8, image 76). Other: None. IMPRESSION: CT BRAIN: 1. No acute intracranial process identified. 2. Generalized age-related cerebral atrophy with chronic small vessel ischemic disease. CT CERVICAL SPINE: 1. No acute traumatic injury within the cervical spine. 2. Moderate to advanced multilevel degenerative spondylolysis with facet arthrosis, most notable at C5-6. 3. 4 mm nodule at the left lung apex, indeterminate. No follow-up needed if patient is low-risk. Non-contrast chest CT can be considered in 12 months if patient is high-risk. This recommendation follows the consensus statement: Guidelines for Management of Incidental Pulmonary Nodules Detected on CT Images: From the Fleischner Society 2017; Radiology 2017; 284:228-243. Electronically Signed   By: Jeannine Boga M.D.   On: 08/16/2017 23:59   Ct Cervical Spine Wo Contrast  Result Date: 08/16/2017 CLINICAL DATA:  Initial evaluation for near syncope, fall. EXAM: CT HEAD WITHOUT CONTRAST CT CERVICAL SPINE WITHOUT CONTRAST TECHNIQUE: Multidetector CT imaging of the head and cervical spine was performed following the standard protocol without intravenous contrast. Multiplanar CT image reconstructions of the cervical spine were also generated. COMPARISON:  Prior MRI from 11/05/2016. FINDINGS: CT HEAD FINDINGS Brain: Age-related cerebral volume loss with chronic small vessel ischemic disease. No acute intracranial hemorrhage. No acute large vessel territory infarct. No mass lesion, midline shift or mass effect. Diffuse ventricular prominence related global parenchymal volume loss without hydrocephalus. No extra-axial fluid collection. Vascular: No hyperdense vessel. Scattered vascular calcifications noted within the carotid siphons. Skull: Scalp soft tissues and calvarium within normal limits. Sinuses/Orbits: Globes and  orbital soft  tissues demonstrate no acute abnormality. Mild scattered mucosal thickening within the ethmoidal air cells. Paranasal sinuses are otherwise clear. No mastoid effusion. Other: None CT CERVICAL SPINE FINDINGS Alignment: Trace anterolisthesis of C5 on C6 due to chronic facet degeneration, stable. Vertebral bodies otherwise normally aligned with preservation of the normal cervical lordosis. Skull base and vertebrae: Skull base intact. Normal C1-2 articulations are preserved in the dens is intact. Vertebral body heights maintained. C6 and C7 vertebral bodies are largely ankylosed. No acute fracture. Few scattered sclerotic lesions noted, similar to previous, which may reflect benign bone islands or be related to remote history of prostate cancer. Soft tissues and spinal canal: No acute soft tissue abnormality within the neck. No abnormal prevertebral edema. Spinal canal within normal limits. Carotid bifurcation atherosclerosis noted. Disc levels: Moderate multilevel degenerative spondylolysis, most notable at C5-6. Prominent right-sided facet arthrosis within the upper cervical spine. Advanced degenerative changes about the C1-2 articulation. Upper chest: Visualized upper chest demonstrates no acute abnormality. Few small subcentimeter hypodense nodules noted within the thyroid, of doubtful significance. No apical pneumothorax. 4 mm nodule noted at the left lung apex, indeterminate (series 8, image 76). Other: None. IMPRESSION: CT BRAIN: 1. No acute intracranial process identified. 2. Generalized age-related cerebral atrophy with chronic small vessel ischemic disease. CT CERVICAL SPINE: 1. No acute traumatic injury within the cervical spine. 2. Moderate to advanced multilevel degenerative spondylolysis with facet arthrosis, most notable at C5-6. 3. 4 mm nodule at the left lung apex, indeterminate. No follow-up needed if patient is low-risk. Non-contrast chest CT can be considered in 12 months if patient is  high-risk. This recommendation follows the consensus statement: Guidelines for Management of Incidental Pulmonary Nodules Detected on CT Images: From the Fleischner Society 2017; Radiology 2017; 284:228-243. Electronically Signed   By: Jeannine Boga M.D.   On: 08/16/2017 23:59   Dg Chest Port 1 View  Result Date: 08/16/2017 CLINICAL DATA:  Cough EXAM: PORTABLE CHEST 1 VIEW COMPARISON:  10/11/2016 FINDINGS: Linear atelectasis or scarring at the left base. No focal pulmonary opacity or pleural effusion. Normal cardiomediastinal silhouette with aortic atherosclerosis. No pneumothorax. IMPRESSION: No active disease.  Linear scarring or atelectasis at the left base Electronically Signed   By: Donavan Foil M.D.   On: 08/16/2017 22:17     Marjie Skiff, MD 08/17/2017, 9:02 AM PGY-2, [place]

## 2017-08-17 NOTE — Care Management Note (Signed)
Case Management Note  Patient Details  Name: Ryan Humphrey MRN: [id number] Date of Birth: 12/26/1928  Subjective/Objective:     Pt admitted with influenza A. She is from home with family.               Action/Plan: Plan is for patient to d/c home when medically stable. CM following.  Expected Discharge Date:  08/21/17               Expected Discharge Plan:     In-House Referral:     Discharge planning Services     Post Acute Care Choice:    Choice offered to:     DME Arranged:    DME Agency:     HH Arranged:    HH Agency:     Status of Service:  In process, will continue to follow  If discussed at Long Length of Stay Meetings, dates discussed:    Additional Comments:  Pollie Friar, RN 08/17/2017, 11:54 AM

## 2017-08-18 LAB — BASIC METABOLIC PANEL
Anion gap: 7 (ref 5–15)
BUN: 22 mg/dL — ABNORMAL HIGH (ref 6–20)
CHLORIDE: 112 mmol/L — AB (ref 101–111)
CO2: 24 mmol/L (ref 22–32)
Calcium: 8.1 mg/dL — ABNORMAL LOW (ref 8.9–10.3)
Creatinine, Ser: 1.64 mg/dL — ABNORMAL HIGH (ref 0.61–1.24)
GFR calc Af Amer: 42 mL/min — ABNORMAL LOW (ref >60)
GFR, EST NON AFRICAN AMERICAN: 36 mL/min — AB (ref >60)
Glucose, Bld: 104 mg/dL — ABNORMAL HIGH (ref 65–99)
POTASSIUM: 4.4 mmol/L (ref 3.5–5.1)
SODIUM: 143 mmol/L (ref 135–145)

## 2017-08-18 LAB — CBC
HCT: 34.4 % — ABNORMAL LOW (ref 39.0–52.0)
HEMOGLOBIN: 11.6 g/dL — AB (ref 13.0–17.0)
MCH: 32.3 pg (ref 26.0–34.0)
MCHC: 33.7 g/dL (ref 30.0–36.0)
MCV: 95.8 fL (ref 78.0–100.0)
PLATELETS: 93 10*3/uL — AB (ref 150–400)
RBC: 3.59 MIL/uL — ABNORMAL LOW (ref 4.22–5.81)
RDW: 12.8 % (ref 11.5–15.5)
WBC: 4.7 10*3/uL (ref 4.0–10.5)

## 2017-08-18 MED ORDER — SODIUM CHLORIDE 0.9 % IV SOLN: INTRAVENOUS | Status: AC

## 2017-08-18 MED ORDER — OSELTAMIVIR PHOSPHATE 30 MG PO CAPS: 30.0000 mg | ORAL_CAPSULE | Freq: Two times a day (BID) | ORAL | 0 refills | Status: DC

## 2017-08-18 MED ORDER — PHENOL 1.4 % MT LIQD
1.0000 | OROMUCOSAL | Status: DC | PRN
  Administered 2017-08-18: 1 via OROMUCOSAL
  Filled 2017-08-18: qty 177

## 2017-08-18 NOTE — Evaluation (Addendum)
Occupational Therapy Evaluation Patient Details Name: Ryan Humphrey MRN: [id number] DOB: 1929-02-20 Today's Date: 08/18/2017    History of Present Illness Patient is a 84 y/o male who presents with syncope and fall at home. Head CT and CXR-unremarkable. Admitted with AMS secondary to the +flu. PMH includes TIA, CAD, HLD, neuropathy, Rt TKA, depression, ca.   Clinical Impression   This 83 y/o M presents with the above. At baseline pt is mod independent with ADLs and functional mobility, intermittently uses SPC. Per chart review pt lives at home with niece who pt reports is not able to provide assist at home. Pt currently requiring minA for functional mobility, standing grooming and LB ADLs. Pt with decreased insight into deficits and increased need for assist, per chart pt with hx of dementia at baseline. Pt will benefit from additional [facility] services after discharge, (feel pt will benefit from homefirst program if eligible) to maximize his overall safety and independence with ADLs and mobility. Will continue to follow acutely to progress pt towards PLOF.     Follow Up Recommendations  Home health OT;Supervision/Assistance - 24 hour;Other (comment)(may benefit from homefirst if eligible)    Equipment Recommendations  None recommended by OT           Precautions / Restrictions Precautions Precautions: Fall Restrictions Weight Bearing Restrictions: No      Mobility Bed Mobility Overal bed mobility: Needs Assistance Bed Mobility: Supine to Sit;Sit to Supine     Supine to sit: Supervision;HOB elevated Sit to supine: Supervision;HOB elevated   General bed mobility comments: No assist needed. supervision for safety   Transfers Overall transfer level: Needs assistance Equipment used: None Transfers: Sit to/from Stand Sit to Stand: Min assist         General transfer comment: minA to rise and steady, increased time/effort     Balance Overall balance assessment: Needs  assistance;History of Falls Sitting-balance support: Feet supported;No upper extremity supported Sitting balance-Leahy Scale: Good     Standing balance support: During functional activity Standing balance-Leahy Scale: Fair                 High Level Balance Comments: Practiced getting on/off the floor going through sequencing and safety techniques per pt request.            ADL either performed or assessed with clinical judgement   ADL Overall ADL's : Needs assistance/impaired Eating/Feeding: Set up;Sitting   Grooming: Wash/dry face;Oral care;Minimal assistance;Min guard;Standing Grooming Details (indicate cue type and reason): MinA, progressed to MinGuard Upper Body Bathing: Sitting;Min guard   Lower Body Bathing: Minimal assistance;Sit to/from stand   Upper Body Dressing : Min guard;Sitting   Lower Body Dressing: Minimal assistance;Sit to/from stand Lower Body Dressing Details (indicate cue type and reason): pt able to complete figure 4 to adjust socks; requires MinA for sit<>stand and initial steadying assist in standing  Toilet Transfer: Minimal assistance;Regular Glass blower/designer Details (indicate cue type and reason): simulated in transfer to/from EOB  Toileting- Clothing Manipulation and Hygiene: Minimal assistance;Sit to/from Nurse, children's Details (indicate cue type and reason): pt reports he uses built in shower seat during bathing task; reinforced use of seat for increased safety  Functional mobility during ADLs: Minimal assistance General ADL Comments: pt completing room level functional mobility without AD with minA, standing ADLs at sink level  Pertinent Vitals/Pain Pain Assessment: No/denies pain     Hand Dominance Right   Extremity/Trunk Assessment Upper Extremity Assessment Upper Extremity Assessment: Defer to OT evaluation   Lower Extremity Assessment Lower Extremity Assessment: Defer to PT  evaluation   Cervical / Trunk Assessment Cervical / Trunk Assessment: Kyphotic   Communication Communication Communication: No difficulties   Cognition Arousal/Alertness: Awake/alert Behavior During Therapy: WFL for tasks assessed/performed Overall Cognitive Status: No family/caregiver present to determine baseline cognitive functioning                                 General Comments: Hx of dementia at baseline. Poor awareness of deficits/safety.    General Comments  Pt still with cough.               Home Living Family/patient expects to be discharged to:: Private residence Living Arrangements: Other relatives(handicapped niece ) Available Help at Discharge: Family;Available PRN/intermittently Type of Home: House Home Access: Level entry     Home Layout: One level     Bathroom Shower/Tub: Tub/shower unit;Walk-in shower   Bathroom Toilet: Standard     Home Equipment: Shower seat - built in;Cane - single point;Walker - 2 wheels          Prior Functioning/Environment Level of Independence: Independent with assistive device(s)        Comments: pt uses a SPC if out in the community. Reports falls in last month and has a hard time getting up off the floor. Niece is not able to assist due to being handicapped.        OT Problem List: Decreased strength;Impaired balance (sitting and/or standing);Decreased range of motion;Decreased activity tolerance      OT Treatment/Interventions: Self-care/ADL training;DME and/or AE instruction;Therapeutic activities;Balance training;Therapeutic exercise;Energy conservation;Patient/family education    OT Goals(Current goals can be found in the care plan section) Acute Rehab OT Goals Patient Stated Goal: to return home  OT Goal Formulation: With patient Time For Goal Achievement: 09/01/17 Potential to Achieve Goals: Good  OT Frequency: Min 2X/week                             AM-PAC PT "6 Clicks"  Daily Activity     Outcome Measure Help from another person eating meals?: None Help from another person taking care of personal grooming?: A Little Help from another person toileting, which includes using toliet, bedpan, or urinal?: A Little Help from another person bathing (including washing, rinsing, drying)?: A Little   Help from another person to put on and taking off regular lower body clothing?: A Little 6 Click Score: 16   End of Session Equipment Utilized During Treatment: Gait belt Nurse Communication: Mobility status  Activity Tolerance: Patient tolerated treatment well Patient left: in bed;with call bell/phone within reach;with bed alarm set  OT Visit Diagnosis: Muscle weakness (generalized) (M62.81);Unsteadiness on feet (R26.81)                Time: 8891-6945 OT Time Calculation (min): 14 min Charges:  OT General Charges $OT Visit: 1 Visit OT Evaluation $OT Eval Moderate Complexity: 1 Mod G-Codes:     Lou Cal, OT Pager [phone] 08/18/2017   Raymondo Band 08/18/2017, 11:19 AM

## 2017-08-18 NOTE — Consult Note (Signed)
[facility] CM Primary Care Navigator  08/18/2017  Jay Haskew Wroe 1929-02-24 [id number]   Met with patient at the bedsideto identify possible discharge needs. Patient reports having generalized weakness, fever and coughingthathad led to this admission.(Acute confusion and weakness in the setting of Influenza A) Patient noted to be forgetful at times but is easily redirected.  Patientendorses Dr. Unk Pinto with [facility] astheprimary care provider.   [facility] [place] [facility] [address] to obtain medications without difficulty.   Patient reports that he has [facility] use of "pill box" system filled once a week.  Patient states that he has beendriving to his doctors' appointments prior to admission but daughter [name]- lives nearby) can provide transportation to his doctors' appointments when needed.   Patient verbalized thathis niece Santiago Glad) temporarily lives with him and can assist with his care needs if needed.  Anticipateddischarge planishomewith home health services per therapy recommendation ([place] if eligible).  Patientvoiced understanding to call primary care provider'soffice when he goeshome for a post discharge follow-up within1-2weeksor sooner if needs arise.Patient letter (with PCP's contact number) was provided as areminder.   Discussed with patient regarding [facility] CM services available for health management at home buthe expressed decline for services which was offered, includingEMMI calls to follow-upwithhis recovery. He mentionedthatheis still able to manage his health issues at home so farand would not need any help yet at this point.  Patient expressedunderstanding to seekreferralfrom primary care provider to [facility] care management if deemednecessaryand appropriate for services in the  future.  [facility] care management information was provided for future needs thathe may have.  Per Inpatient CM note, Tommi Rumps with [facility] feels the patient is a good candidate for [place] program. Tommi Rumps is going to meet with patient prior to discharge.   For additional questions please contact:  Edwena Felty A. Shronda Boeh, BSN, RN-BC [facility] PRIMARY CARE Navigator Cell: [phone]

## 2017-08-18 NOTE — Consult Note (Signed)
   [facility] CM Inpatient Consult   08/18/2017  Ryan Humphrey [birth year] [id number]   Made aware of Mr. Calabrese's enrollment with [facility] First program. Spoke with Tommi Rumps with [place].  Advised to call patient's daughter, Renie Ora, at [phone] to discuss [place] Management's involvement if needed.  Spoke with [name] to make aware that if Mr. Nale should require pharmacy or transportation assistance while on the [place] program, [place] Management will assist. Also discussed that [place] Management could potentially follow at later time if community case management needs are identified after [place] completion. [name] (daughter) agreeable to this.  Will notify [place] Management office of Mr. Zurcher's enrollment in the [place], [place], RN,BSN [facility] Liaison [phone]

## 2017-08-18 NOTE — Discharge Summary (Signed)
Discharge Summary  GILLIAN KLUEVER KYH:[id number] DOB: 02-20-1929  PCP: Unk Pinto, MD  Admit date: 08/16/2017 Discharge date: 08/18/2017  Time spent: < 25 minutes  Admitted From: Home  Disposition: Home  Recommendations for Outpatient Follow-up:  1. Complete Tamiflu therapy, patient has 3 more days-END DATE 3/11 2. Follow up with PCP in 1 week  3. Please obtain BMP in one week to follow up on kidney function 4. Please follow up on the following pending results: None  Home Health: Yes Equipment/Devices: Rolling walker  Discharge Diagnoses:  Active Hospital Problems   Diagnosis Date Noted  . Influenza A 08/17/2017  . Acute encephalopathy 08/17/2017  . CKD (chronic kidney disease) stage 3, GFR 30-59 ml/min (HCC) 08/17/2017  . Influenza 08/17/2017  . MCI (mild cognitive impairment) 08/20/2015  . Essential hypertension, benign 04/10/2009    Resolved Hospital Problems  No resolved problems to display.    Discharge Condition: Stable  CODE STATUS: Full Diet recommendation: Heart Healthy  Vitals:   08/18/17 0457 08/18/17 0901  BP: (!) 135/57 139/61  Pulse: 77 89  Resp: 18 18  Temp: 98 F (36.7 C) 98.3 F (36.8 C)  SpO2: 96% 97%    History of present illness:  Ryan Humphrey is a 84 y.o. year old male with medical history significant for CAD, MCI, HTN who presented on 08/16/2017 with fever, cough, generalized weakness and mild confusion and was found to be flu positive.   Hospital Course:  Principal Problem:   Influenza A Active Problems:   Essential hypertension, benign   MCI (mild cognitive impairment)   Acute encephalopathy   CKD (chronic kidney disease) stage 3, GFR 30-59 ml/min (HCC)   Influenza #Altered mental status, acute, resolved Patient presented with mild confusion. CT Head was negative for any acute intracranial process. CXR was also negative for any acute infectious process. UA was unremarkable. AMS likely secondary to flu. LA 2.18>>1.0. Patient  does have a history of mild dementia at baseline. --Continue tamiflu 30 mg bid  # Influenza A positive, resoving Patient respiratory status continue to improve throughout hospitalization  Mild cough but improving. No fever, and rest of vitals within normal limits. --Continue tamiflu renal dosing  #Acute on chronic kidney disease III, improving Patient has a history of CKD-3. On admission, creat 1.65 ( baseline ~1.3) likely 2/2 to poor po fluid intake with AMS  In the setting of flu. Patient also had a LA of 2.18 on admission which quickly resolved. Patient will need to maintain good po intake after discharge and will follow up with PCP for repeat BMP   #HTN Hypotensive on admission, quickly resolved during admission. Patient mildly hypertensive. Will resume home meds. --Reume propanolol 20 mg daily  #Hyperlipidemia Continue simvastatin 20 mg daily   #Vit D deficiency  Continue Cholecalciferol 8000 units daily  #Mild dementia --Continue sertraline 100 mg daily  Antibiotics: None  Microbiology: Blood cultures showed no growth to date  Consultations:  None   Procedures/Studies:  Ct Head Wo Contrast  Result Date: 08/16/2017 CLINICAL DATA:  Initial evaluation for near syncope, fall. EXAM: CT HEAD WITHOUT CONTRAST CT CERVICAL SPINE WITHOUT CONTRAST TECHNIQUE: Multidetector CT imaging of the head and cervical spine was performed following the standard protocol without intravenous contrast. Multiplanar CT image reconstructions of the cervical spine were also generated. COMPARISON:  Prior MRI from 11/05/2016. FINDINGS: CT HEAD FINDINGS Brain: Age-related cerebral volume loss with chronic small vessel ischemic disease. No acute intracranial hemorrhage. No acute large vessel territory infarct.  No mass lesion, midline shift or mass effect. Diffuse ventricular prominence related global parenchymal volume loss without hydrocephalus. No extra-axial fluid collection. Vascular: No hyperdense  vessel. Scattered vascular calcifications noted within the carotid siphons. Skull: Scalp soft tissues and calvarium within normal limits. Sinuses/Orbits: Globes and orbital soft tissues demonstrate no acute abnormality. Mild scattered mucosal thickening within the ethmoidal air cells. Paranasal sinuses are otherwise clear. No mastoid effusion. Other: None CT CERVICAL SPINE FINDINGS Alignment: Trace anterolisthesis of C5 on C6 due to chronic facet degeneration, stable. Vertebral bodies otherwise normally aligned with preservation of the normal cervical lordosis. Skull base and vertebrae: Skull base intact. Normal C1-2 articulations are preserved in the dens is intact. Vertebral body heights maintained. C6 and C7 vertebral bodies are largely ankylosed. No acute fracture. Few scattered sclerotic lesions noted, similar to previous, which may reflect benign bone islands or be related to remote history of prostate cancer. Soft tissues and spinal canal: No acute soft tissue abnormality within the neck. No abnormal prevertebral edema. Spinal canal within normal limits. Carotid bifurcation atherosclerosis noted. Disc levels: Moderate multilevel degenerative spondylolysis, most notable at C5-6. Prominent right-sided facet arthrosis within the upper cervical spine. Advanced degenerative changes about the C1-2 articulation. Upper chest: Visualized upper chest demonstrates no acute abnormality. Few small subcentimeter hypodense nodules noted within the thyroid, of doubtful significance. No apical pneumothorax. 4 mm nodule noted at the left lung apex, indeterminate (series 8, image 76). Other: None. IMPRESSION: CT BRAIN: 1. No acute intracranial process identified. 2. Generalized age-related cerebral atrophy with chronic small vessel ischemic disease. CT CERVICAL SPINE: 1. No acute traumatic injury within the cervical spine. 2. Moderate to advanced multilevel degenerative spondylolysis with facet arthrosis, most notable at C5-6.  3. 4 mm nodule at the left lung apex, indeterminate. No follow-up needed if patient is low-risk. Non-contrast chest CT can be considered in 12 months if patient is high-risk. This recommendation follows the consensus statement: Guidelines for Management of Incidental Pulmonary Nodules Detected on CT Images: From the Fleischner Society 2017; Radiology 2017; 284:228-243. Electronically Signed   By: Jeannine Boga M.D.   On: 08/16/2017 23:59   Ct Cervical Spine Wo Contrast  Result Date: 08/16/2017 CLINICAL DATA:  Initial evaluation for near syncope, fall. EXAM: CT HEAD WITHOUT CONTRAST CT CERVICAL SPINE WITHOUT CONTRAST TECHNIQUE: Multidetector CT imaging of the head and cervical spine was performed following the standard protocol without intravenous contrast. Multiplanar CT image reconstructions of the cervical spine were also generated. COMPARISON:  Prior MRI from 11/05/2016. FINDINGS: CT HEAD FINDINGS Brain: Age-related cerebral volume loss with chronic small vessel ischemic disease. No acute intracranial hemorrhage. No acute large vessel territory infarct. No mass lesion, midline shift or mass effect. Diffuse ventricular prominence related global parenchymal volume loss without hydrocephalus. No extra-axial fluid collection. Vascular: No hyperdense vessel. Scattered vascular calcifications noted within the carotid siphons. Skull: Scalp soft tissues and calvarium within normal limits. Sinuses/Orbits: Globes and orbital soft tissues demonstrate no acute abnormality. Mild scattered mucosal thickening within the ethmoidal air cells. Paranasal sinuses are otherwise clear. No mastoid effusion. Other: None CT CERVICAL SPINE FINDINGS Alignment: Trace anterolisthesis of C5 on C6 due to chronic facet degeneration, stable. Vertebral bodies otherwise normally aligned with preservation of the normal cervical lordosis. Skull base and vertebrae: Skull base intact. Normal C1-2 articulations are preserved in the dens is  intact. Vertebral body heights maintained. C6 and C7 vertebral bodies are largely ankylosed. No acute fracture. Few scattered sclerotic lesions noted, similar to previous, which may  reflect benign bone islands or be related to remote history of prostate cancer. Soft tissues and spinal canal: No acute soft tissue abnormality within the neck. No abnormal prevertebral edema. Spinal canal within normal limits. Carotid bifurcation atherosclerosis noted. Disc levels: Moderate multilevel degenerative spondylolysis, most notable at C5-6. Prominent right-sided facet arthrosis within the upper cervical spine. Advanced degenerative changes about the C1-2 articulation. Upper chest: Visualized upper chest demonstrates no acute abnormality. Few small subcentimeter hypodense nodules noted within the thyroid, of doubtful significance. No apical pneumothorax. 4 mm nodule noted at the left lung apex, indeterminate (series 8, image 76). Other: None. IMPRESSION: CT BRAIN: 1. No acute intracranial process identified. 2. Generalized age-related cerebral atrophy with chronic small vessel ischemic disease. CT CERVICAL SPINE: 1. No acute traumatic injury within the cervical spine. 2. Moderate to advanced multilevel degenerative spondylolysis with facet arthrosis, most notable at C5-6. 3. 4 mm nodule at the left lung apex, indeterminate. No follow-up needed if patient is low-risk. Non-contrast chest CT can be considered in 12 months if patient is high-risk. This recommendation follows the consensus statement: Guidelines for Management of Incidental Pulmonary Nodules Detected on CT Images: From the Fleischner Society 2017; Radiology 2017; 284:228-243. Electronically Signed   By: Jeannine Boga M.D.   On: 08/16/2017 23:59   Dg Chest Port 1 View  Result Date: 08/16/2017 CLINICAL DATA:  Cough EXAM: PORTABLE CHEST 1 VIEW COMPARISON:  10/11/2016 FINDINGS: Linear atelectasis or scarring at the left base. No focal pulmonary opacity or  pleural effusion. Normal cardiomediastinal silhouette with aortic atherosclerosis. No pneumothorax. IMPRESSION: No active disease.  Linear scarring or atelectasis at the left base Electronically Signed   By: Donavan Foil M.D.   On: 08/16/2017 22:17     Discharge Exam: BP 139/61 (BP Location: Right Arm)   Pulse 89   Temp 98.3 F (36.8 C) (Oral)   Resp 18   Ht 6' (1.829 m)   Wt 171 lb 8.3 oz (77.8 kg)   SpO2 97%   BMI 23.26 kg/m   General: Lying in bed, no apparent distress Eyes: EOMI, anicteric ENT: Oral Mucosa clear and moist Neck: normal, no thyromegaly Cardiovascular: regular rate and rhythm, no murmurs, rubs or gallops, Peripheral Pulses Present, no edema, no JVD Respiratory: Normal respiratory effort, lungs clear to auscultation bilaterally Abdomen: soft, non-distended, non-tender, normal bowel sounds Skin: No Rash Musculoskeletal:Good ROM, no contractures. Normal muscle tone Neurologic: Grossly no focal neuro deficit.Mental status AAOx3, speech normal, Psychiatric: Appropriate affect, and mood   Discharge Instructions You were cared for by a hospitalist during your hospital stay. If you have any questions about your discharge medications or the care you received while you were in the hospital after you are discharged, you can call the unit and asked to speak with the hospitalist on call if the hospitalist that took care of you is not available. Once you are discharged, your primary care physician will handle any further medical issues. Please note that NO REFILLS for any discharge medications will be authorized once you are discharged, as it is imperative that you return to your primary care physician (or establish a relationship with a primary care physician if you do not have one) for your aftercare needs so that they can reassess your need for medications and monitor your lab values.  Discharge Instructions    Diet - low sodium heart healthy   Complete by:  As directed     Discharge instructions   Complete by:  As directed  Patient will take tamiflu 2 times (morning and evening) a day for the next three days. Patient had one dose today prior to discharge and will need his evening dose.   Increase activity slowly   Complete by:  As directed      Allergies as of 08/18/2017      Reactions   Niacin Itching   Penicillins Other (See Comments)   "rash"   Tramadol Itching      Medication List    TAKE these medications   gabapentin 400 MG capsule Commonly known as:  NEURONTIN Take 400 mg by mouth 3 (three) times daily.   ipratropium 0.03 % nasal spray Commonly known as:  ATROVENT Place 2 sprays into the nose 3 (three) times daily. What changed:    when to take this  reasons to take this   omeprazole 40 MG capsule Commonly known as:  PRILOSEC Take 1 capsule (40 mg total) by mouth every morning. Before breakfast   oseltamivir 30 MG capsule Commonly known as:  TAMIFLU Take 1 capsule (30 mg total) by mouth 2 (two) times daily.   propranolol 20 MG tablet Commonly known as:  INDERAL Take 1 tablet (20 mg total) by mouth 2 (two) times daily.   sertraline 100 MG tablet Commonly known as:  ZOLOFT 1 pill daily for mood What changed:    how much to take  how to take this  when to take this  additional instructions   simvastatin 20 MG tablet Commonly known as:  ZOCOR Take 1 tablet (20 mg total) by mouth every evening.   Vitamin D 2000 units tablet Take 8,000 Units by mouth daily.            Durable Medical Equipment  (From admission, onward)        Start     Ordered   08/18/17 1031  For home use only DME Walker rolling  [facility])  Once    Question:  Patient needs a walker to treat with the following condition  Answer:  Unstable gait   08/18/17 1036     Allergies  Allergen Reactions  . Niacin Itching  . Penicillins Other (See Comments)    "rash"  . Tramadol Itching      The results of significant diagnostics from this  hospitalization (including imaging, microbiology, ancillary and laboratory) are listed below for reference.    Significant Diagnostic Studies: Ct Head Wo Contrast  Result Date: 08/16/2017 CLINICAL DATA:  Initial evaluation for near syncope, fall. EXAM: CT HEAD WITHOUT CONTRAST CT CERVICAL SPINE WITHOUT CONTRAST TECHNIQUE: Multidetector CT imaging of the head and cervical spine was performed following the standard protocol without intravenous contrast. Multiplanar CT image reconstructions of the cervical spine were also generated. COMPARISON:  Prior MRI from 11/05/2016. FINDINGS: CT HEAD FINDINGS Brain: Age-related cerebral volume loss with chronic small vessel ischemic disease. No acute intracranial hemorrhage. No acute large vessel territory infarct. No mass lesion, midline shift or mass effect. Diffuse ventricular prominence related global parenchymal volume loss without hydrocephalus. No extra-axial fluid collection. Vascular: No hyperdense vessel. Scattered vascular calcifications noted within the carotid siphons. Skull: Scalp soft tissues and calvarium within normal limits. Sinuses/Orbits: Globes and orbital soft tissues demonstrate no acute abnormality. Mild scattered mucosal thickening within the ethmoidal air cells. Paranasal sinuses are otherwise clear. No mastoid effusion. Other: None CT CERVICAL SPINE FINDINGS Alignment: Trace anterolisthesis of C5 on C6 due to chronic facet degeneration, stable. Vertebral bodies otherwise normally aligned with preservation of the normal cervical  lordosis. Skull base and vertebrae: Skull base intact. Normal C1-2 articulations are preserved in the dens is intact. Vertebral body heights maintained. C6 and C7 vertebral bodies are largely ankylosed. No acute fracture. Few scattered sclerotic lesions noted, similar to previous, which may reflect benign bone islands or be related to remote history of prostate cancer. Soft tissues and spinal canal: No acute soft tissue  abnormality within the neck. No abnormal prevertebral edema. Spinal canal within normal limits. Carotid bifurcation atherosclerosis noted. Disc levels: Moderate multilevel degenerative spondylolysis, most notable at C5-6. Prominent right-sided facet arthrosis within the upper cervical spine. Advanced degenerative changes about the C1-2 articulation. Upper chest: Visualized upper chest demonstrates no acute abnormality. Few small subcentimeter hypodense nodules noted within the thyroid, of doubtful significance. No apical pneumothorax. 4 mm nodule noted at the left lung apex, indeterminate (series 8, image 76). Other: None. IMPRESSION: CT BRAIN: 1. No acute intracranial process identified. 2. Generalized age-related cerebral atrophy with chronic small vessel ischemic disease. CT CERVICAL SPINE: 1. No acute traumatic injury within the cervical spine. 2. Moderate to advanced multilevel degenerative spondylolysis with facet arthrosis, most notable at C5-6. 3. 4 mm nodule at the left lung apex, indeterminate. No follow-up needed if patient is low-risk. Non-contrast chest CT can be considered in 12 months if patient is high-risk. This recommendation follows the consensus statement: Guidelines for Management of Incidental Pulmonary Nodules Detected on CT Images: From the Fleischner Society 2017; Radiology 2017; 284:228-243. Electronically Signed   By: Jeannine Boga M.D.   On: 08/16/2017 23:59   Ct Cervical Spine Wo Contrast  Result Date: 08/16/2017 CLINICAL DATA:  Initial evaluation for near syncope, fall. EXAM: CT HEAD WITHOUT CONTRAST CT CERVICAL SPINE WITHOUT CONTRAST TECHNIQUE: Multidetector CT imaging of the head and cervical spine was performed following the standard protocol without intravenous contrast. Multiplanar CT image reconstructions of the cervical spine were also generated. COMPARISON:  Prior MRI from 11/05/2016. FINDINGS: CT HEAD FINDINGS Brain: Age-related cerebral volume loss with chronic small  vessel ischemic disease. No acute intracranial hemorrhage. No acute large vessel territory infarct. No mass lesion, midline shift or mass effect. Diffuse ventricular prominence related global parenchymal volume loss without hydrocephalus. No extra-axial fluid collection. Vascular: No hyperdense vessel. Scattered vascular calcifications noted within the carotid siphons. Skull: Scalp soft tissues and calvarium within normal limits. Sinuses/Orbits: Globes and orbital soft tissues demonstrate no acute abnormality. Mild scattered mucosal thickening within the ethmoidal air cells. Paranasal sinuses are otherwise clear. No mastoid effusion. Other: None CT CERVICAL SPINE FINDINGS Alignment: Trace anterolisthesis of C5 on C6 due to chronic facet degeneration, stable. Vertebral bodies otherwise normally aligned with preservation of the normal cervical lordosis. Skull base and vertebrae: Skull base intact. Normal C1-2 articulations are preserved in the dens is intact. Vertebral body heights maintained. C6 and C7 vertebral bodies are largely ankylosed. No acute fracture. Few scattered sclerotic lesions noted, similar to previous, which may reflect benign bone islands or be related to remote history of prostate cancer. Soft tissues and spinal canal: No acute soft tissue abnormality within the neck. No abnormal prevertebral edema. Spinal canal within normal limits. Carotid bifurcation atherosclerosis noted. Disc levels: Moderate multilevel degenerative spondylolysis, most notable at C5-6. Prominent right-sided facet arthrosis within the upper cervical spine. Advanced degenerative changes about the C1-2 articulation. Upper chest: Visualized upper chest demonstrates no acute abnormality. Few small subcentimeter hypodense nodules noted within the thyroid, of doubtful significance. No apical pneumothorax. 4 mm nodule noted at the left lung apex, indeterminate (series 8,  image 76). Other: None. IMPRESSION: CT BRAIN: 1. No acute  intracranial process identified. 2. Generalized age-related cerebral atrophy with chronic small vessel ischemic disease. CT CERVICAL SPINE: 1. No acute traumatic injury within the cervical spine. 2. Moderate to advanced multilevel degenerative spondylolysis with facet arthrosis, most notable at C5-6. 3. 4 mm nodule at the left lung apex, indeterminate. No follow-up needed if patient is low-risk. Non-contrast chest CT can be considered in 12 months if patient is high-risk. This recommendation follows the consensus statement: Guidelines for Management of Incidental Pulmonary Nodules Detected on CT Images: From the Fleischner Society 2017; Radiology 2017; 284:228-243. Electronically Signed   By: Jeannine Boga M.D.   On: 08/16/2017 23:59   Dg Chest Port 1 View  Result Date: 08/16/2017 CLINICAL DATA:  Cough EXAM: PORTABLE CHEST 1 VIEW COMPARISON:  10/11/2016 FINDINGS: Linear atelectasis or scarring at the left base. No focal pulmonary opacity or pleural effusion. Normal cardiomediastinal silhouette with aortic atherosclerosis. No pneumothorax. IMPRESSION: No active disease.  Linear scarring or atelectasis at the left base Electronically Signed   By: Donavan Foil M.D.   On: 08/16/2017 22:17    Microbiology: Recent Results (from the past 240 hour(s))  Blood Culture (routine x 2)     Status: None (Preliminary result)   Collection Time: 08/16/17 10:38 PM  Result Value Ref Range Status   Specimen Description BLOOD RIGHT ANTECUBITAL  Final   Special Requests   Final    AEROBIC BOTTLE ONLY Blood Culture results may not be optimal due to an inadequate volume of blood received in culture bottles   Culture   Final    NO GROWTH < 12 HOURS Performed at [facility] [address], [place], [place] [address]    Report Status PENDING  Incomplete  Blood Culture (routine x 2)     Status: None (Preliminary result)   Collection Time: 08/16/17 10:41 PM  Result Value Ref Range Status   Specimen  Description BLOOD LEFT WRIST  Final   Special Requests   Final    BOTTLES DRAWN AEROBIC AND ANAEROBIC Blood Culture adequate volume   Culture   Final    NO GROWTH < 12 HOURS Performed at [facility], [place] [address] [place], [place] [address]    Report Status PENDING  Incomplete     Labs: Basic Metabolic Panel: Recent Labs  Lab 08/16/17 2242 08/17/17 0806 08/18/17 0356  NA 139  --  143  K 3.9  --  4.4  CL 105  --  112*  CO2 22  --  24  GLUCOSE 89  --  104*  BUN 23*  --  22*  CREATININE 1.65* 1.48* 1.64*  CALCIUM 9.2  --  8.1*   Liver Function Tests: Recent Labs  Lab 08/16/17 2242  AST 20  ALT 9*  ALKPHOS 74  BILITOT 0.7  PROT 7.1  ALBUMIN 4.2   No results for input(s): LIPASE, AMYLASE in the last 168 hours. No results for input(s): AMMONIA in the last 168 hours. CBC: Recent Labs  Lab 08/16/17 2242 08/17/17 0806 08/18/17 0356  WBC 7.6 5.0 4.7  NEUTROABS 6.3  --   --   HGB 14.7 12.5* 11.6*  HCT 43.1 37.3* 34.4*  MCV 95.4 96.6 95.8  PLT 121* 105* 93*   Cardiac Enzymes: Recent Labs  Lab 08/16/17 2242  TROPONINI <0.03   BNP: BNP (last 3 results) No results for input(s): BNP in the last 8760 hours.  ProBNP (last  3 results) No results for input(s): PROBNP in the last 8760 hours.  CBG: No results for input(s): GLUCAP in the last 168 hours.    Signed:  Marjie Skiff, MD [facility] 08/18/2017, 10:36 AM

## 2017-08-18 NOTE — Care Management Note (Addendum)
Case Management Note  Patient Details  Name: Ryan Humphrey MRN: [id number] Date of Birth: [birth year]  Subjective/Objective:                    Action/Plan: Pt discharging home with orders for [facility] services. PT/OT recommending [facility]. CM spoke with patient and he is in agreement and would like to use [facility]. CM spoke to [facility] with [place] and he feels the patient is a good candidate. Ryan Humphrey is going to meet with patient prior to d/c. Pt with orders for a walker. Ryan Humphrey with [facility] DME notified and delivered to the room.   Pt has transportation home via his daughter. He also states she could provide some assistance at home. He says his niece he lives with is able to provide some assistance just not much physical assistance.    Expected Discharge Date:  08/18/17               Expected Discharge Plan:  [place]  In-House Referral:     Discharge planning Services  CM Consult  Post Acute Care Choice:    Choice offered to:  Patient  DME Arranged:    DME Agency:     HH Arranged:  PT, OT, RN [place] Agency:  [facility] first)  Status of Service:  Completed, signed off  If discussed at [place] of Stay Meetings, dates discussed:    Additional Comments:  Ryan Friar, RN 08/18/2017, 12:01 PM

## 2017-08-18 NOTE — Evaluation (Signed)
Physical Therapy Evaluation Patient Details Name: Ryan Humphrey MRN: [id number] DOB: 03-Mar-1929 Today's Date: 08/18/2017   History of Present Illness  Patient is a 84 y/o male who presents with syncope and fall at home. Head CT and CXR-unremarkable. Admitted with AMS secondary to the +flu. PMH includes TIA, CAD, HLD, neuropathy, Rt TKA, depression, ca.  Clinical Impression  Patient presents with generalized weakness, impaired balance and impaired mobility s/p above. Per chart, pt with hx of dementia and demonstrates poor safety awareness and awareness of deficits. Pt lives with handicapped niece who is not able to assist physically but he is home alone most days. Pt high fall risk. Pt uses SPC and is Mod I PTA but reports multiple falls in the last month. Tolerated gait training with Min A due to instability and imbalance. Would benefit using RW for support. Also practiced getting up off the floor with PT. Would be a great Home First candidate, if eligible. Will follow acutely to maximize independence and mobility prior to return home.     Follow Up Recommendations Supervision for mobility/OOB;Home health PT(Home First)    Equipment Recommendations  None recommended by PT    Recommendations for Other Services       Precautions / Restrictions Precautions Precautions: Fall Restrictions Weight Bearing Restrictions: No      Mobility  Bed Mobility Overal bed mobility: Needs Assistance Bed Mobility: Supine to Sit;Sit to Supine     Supine to sit: Supervision;HOB elevated Sit to supine: Supervision;HOB elevated   General bed mobility comments: No assist needed. No dizziness.  Transfers Overall transfer level: Needs assistance Equipment used: None Transfers: Sit to/from Stand Sit to Stand: Min guard         General transfer comment: Min guard for safety. Stood from [employer].  Ambulation/Gait Ambulation/Gait assistance: Min assist Ambulation Distance (Feet): 300 Feet Assistive  device: None Gait Pattern/deviations: Step-through pattern;Decreased stride length;Staggering right;Drifts right/left;Trunk flexed;Narrow base of support;Scissoring Gait velocity: decreased   General Gait Details: Slow, unsteady gait with pt reaching for UE support during gait; staggering and instability noted. Might benefit from RW.  Stairs            Wheelchair Mobility    Modified Rankin (Stroke Patients Only)       Balance Overall balance assessment: Needs assistance;History of Falls Sitting-balance support: Feet supported;No upper extremity supported Sitting balance-Leahy Scale: Good     Standing balance support: During functional activity Standing balance-Leahy Scale: Fair                 High Level Balance Comments: Practiced getting on/off the floor going through sequencing and safety techniques per pt request.              Pertinent Vitals/Pain Pain Assessment: No/denies pain    Home Living Family/patient expects to be discharged to:: Private residence Living Arrangements: Other relatives(handicapped niece) Available Help at Discharge: Family;Available PRN/intermittently Type of Home: House Home Access: Level entry     Home Layout: One level Home Equipment: Shower seat - built in;Cane - single point;Walker - 2 wheels      Prior Function Level of Independence: Independent with assistive device(s)         Comments: pt uses a SPC if out in the community. Reports falls in last month and has a hard time getting up off the floor. Niece is not able to assist due to being handicapped.     Hand Dominance   Dominant Hand: Right  Extremity/Trunk Assessment   Upper Extremity Assessment Upper Extremity Assessment: Defer to OT evaluation    Lower Extremity Assessment Lower Extremity Assessment: Generalized weakness(4-5/5 with MMT)    Cervical / Trunk Assessment Cervical / Trunk Assessment: Kyphotic  Communication   Communication: No  difficulties  Cognition Arousal/Alertness: Awake/alert Behavior During Therapy: WFL for tasks assessed/performed Overall Cognitive Status: No family/caregiver present to determine baseline cognitive functioning                                 General Comments: Hx of dementia at baseline. Poor awareness of deficits/safety.       General Comments General comments (skin integrity, edema, etc.): Pt still with cough.    Exercises     Assessment/Plan    PT Assessment Patient needs continued PT services  PT Problem List Decreased strength;Decreased mobility;Decreased safety awareness;Decreased balance;Decreased cognition       PT Treatment Interventions DME instruction;Functional mobility training;Balance training;Patient/family education;Gait training;Therapeutic activities;Therapeutic exercise;Stair training    PT Goals (Current goals can be found in the Care Plan section)  Acute Rehab PT Goals Patient Stated Goal: to be able to get up off the floor PT Goal Formulation: With patient Time For Goal Achievement: 09/01/17 Potential to Achieve Goals: Good    Frequency Min 3X/week   Barriers to discharge Decreased caregiver support pretty much home alone    Co-evaluation               AM-PAC PT "6 Clicks" Daily Activity  Outcome Measure Difficulty turning over in bed (including adjusting bedclothes, sheets and blankets)?: None Difficulty moving from lying on back to sitting on the side of the bed? : None Difficulty sitting down on and standing up from a chair with arms (e.g., wheelchair, bedside commode, etc,.)?: None Help needed moving to and from a bed to chair (including a wheelchair)?: A Little Help needed walking in hospital room?: A Little Help needed climbing 3-5 steps with a railing? : A Little 6 Click Score: 21    End of Session Equipment Utilized During Treatment: Gait belt Activity Tolerance: Patient tolerated treatment well Patient left: in  bed;with call bell/phone within reach;with bed alarm set Nurse Communication: Mobility status PT Visit Diagnosis: Unsteadiness on feet (R26.81);Muscle weakness (generalized) (M62.81);Difficulty in walking, not elsewhere classified (R26.2)    Time: 1287-8676 PT Time Calculation (min) (ACUTE ONLY): 27 min   Charges:   PT Evaluation $PT Eval Low Complexity: 1 Low PT Treatments $Gait Training: 8-22 mins   PT G Codes:        Wray Kearns, PT, DPT [phone]    Marguarite Arbour A Sarenity Ramaker 08/18/2017, 9:26 AM

## 2017-08-19 DIAGNOSIS — I2510 Atherosclerotic heart disease of native coronary artery without angina pectoris: Secondary | ICD-10-CM | POA: Diagnosis not present

## 2017-08-19 DIAGNOSIS — J09X2 Influenza due to identified novel influenza A virus with other respiratory manifestations: Secondary | ICD-10-CM | POA: Diagnosis not present

## 2017-08-20 DIAGNOSIS — J09X2 Influenza due to identified novel influenza A virus with other respiratory manifestations: Secondary | ICD-10-CM | POA: Diagnosis not present

## 2017-08-20 DIAGNOSIS — I2510 Atherosclerotic heart disease of native coronary artery without angina pectoris: Secondary | ICD-10-CM | POA: Diagnosis not present

## 2017-08-21 ENCOUNTER — Ambulatory Visit: Payer: BLUE CROSS/BLUE SHIELD | Admitting: Neurology

## 2017-08-21 LAB — CULTURE, BLOOD (ROUTINE X 2)
Culture: NO GROWTH
Culture: NO GROWTH
SPECIAL REQUESTS: ADEQUATE

## 2017-08-22 DIAGNOSIS — I2510 Atherosclerotic heart disease of native coronary artery without angina pectoris: Secondary | ICD-10-CM | POA: Diagnosis not present

## 2017-08-22 DIAGNOSIS — J09X2 Influenza due to identified novel influenza A virus with other respiratory manifestations: Secondary | ICD-10-CM | POA: Diagnosis not present

## 2017-08-24 DIAGNOSIS — J09X2 Influenza due to identified novel influenza A virus with other respiratory manifestations: Secondary | ICD-10-CM | POA: Diagnosis not present

## 2017-08-24 DIAGNOSIS — I2510 Atherosclerotic heart disease of native coronary artery without angina pectoris: Secondary | ICD-10-CM | POA: Diagnosis not present

## 2017-08-24 NOTE — Progress Notes (Signed)
Hospital follow up  Assessment and Plan: Hospital visit follow up for : influenza and acute mental status change Hospital discharge meds were reviewed, and reconciled with the patient.   Medications Discontinued During This Encounter  Medication Reason  . oseltamivir (TAMIFLU) 30 MG capsule Completed Course      Essential hypertension, benign At goal; continue medications Monitor blood pressure at home; call if consistently over 130/80 Continue DASH diet.   Reminder to go to the ER if any CP, SOB, nausea, dizziness, severe HA, changes vision/speech, left arm numbness and tingling and jaw pain.  Influenza A Resolved  Acute encephalopathy Resolved/back to baseline  CKD (chronic kidney disease) stage 3, GFR 30-59 ml/min (HCC) Increase fluids- recommend aiming for 1/2 body weight in fluid ounces, avoid NSAIDS, monitor sugars, will monitor -     BASIC METABOLIC PANEL WITH GFR  Medication management -     CBC with Differential/Platelet -     BASIC METABOLIC PANEL WITH GFR -     Hepatic function panel  Mixed hyperlipidemia Continue medications: pravastatin  Continue low cholesterol diet and exercise.  Check lipid panel.  -     Lipid panel -     TSH  Vitamin D deficiency Continue supplementation for goal of 70-100 -     VITAMIN D 25 Hydroxy (Vit-D Deficiency, Fractures)  Over 40 minutes of exam, counseling, chart review, and complex, high/moderate level critical decision making was performed this visit.   Future Appointments  Date Time Provider [place]  12/01/2017 10:30 AM Unk Pinto, MD [facility] None  06/21/2018 10:00 AM Unk Pinto, MD [facility] None     HPI 84 y.o.male medical history significant for CAD, MCI, HTN presents for follow up for transition from recent hospitalization. Admit date to the hospital was 08/16/17, patient was discharged from the hospital on 08/18/17 and our clinical staff contacted the office the day after discharge to set up a  follow up appointment. The discharge summary, medications, and diagnostic test results were reviewed before meeting with the patient. The patient was admitted for: influenza with altered mental status. He presented to the ED on 08/16/2017 with fever, cough, generalized weakness and mild confusion and was found to be flu positive.   Altered mental status, acute, resolved Patient presented with mild confusion. CT Head was negative for any acute intracranial process. CXR was also negative for any acute infectious process. UA was unremarkable. AMS likely secondary to flu. LA 2.18>>1.0.Patient does have a history of mild dementia at baseline. Appears well today and back to baseline.   Influenza A positive, resolved Patient has completed tamiflu and fully recovered/back to baseline, with very mild residual non-productive cough.    Acute on chronic kidney disease III Patient has a history of CKD-3. On admission, creat 1.65 ( baseline ~1.3) likely 2/2 to poor po fluid intake with AMS In the setting of flu. Patient also had a LA of 2.18 on admission which quickly resolved. Will follow up with BMP today.   He is also due for routine follow up for chronic condition management today -   BMI is Body mass index is 23.82 kg/m., he has not been working on diet and exercise. Wt Readings from Last 3 Encounters:  08/25/17 167 lb 3.2 oz (75.8 kg)  08/17/17 171 lb 8.3 oz (77.8 kg)  05/23/17 170 lb 6.4 oz (77.3 kg)   He does not check BP at home, today their BP is BP: 118/62  He does not workout, reports he walks "  a lot."  He denies chest pain, shortness of breath, dizziness.   He is on cholesterol medication (simvastatin 20 mg daily) and denies myalgias. His cholesterol is not at goal. The cholesterol last visit was:   Lab Results  Component Value Date   CHOL 218 (H) 05/23/2017   HDL 41 05/23/2017   LDLCALC 150 (H) 05/23/2017   TRIG 148 05/23/2017   CHOLHDL 5.3 (H) 05/23/2017   Patient is on Vitamin D  supplement but remained below goal of 70 at last check:  Lab Results  Component Value Date   VD25OH 55 05/23/2017      Home health is not involved.   Images while in the hospital: Ct Head Wo Contrast  Result Date: 08/16/2017 CLINICAL DATA:  Initial evaluation for near syncope, fall. EXAM: CT HEAD WITHOUT CONTRAST CT CERVICAL SPINE WITHOUT CONTRAST TECHNIQUE: Multidetector CT imaging of the head and cervical spine was performed following the standard protocol without intravenous contrast. Multiplanar CT image reconstructions of the cervical spine were also generated. COMPARISON:  Prior MRI from 11/05/2016. FINDINGS: CT HEAD FINDINGS Brain: Age-related cerebral volume loss with chronic small vessel ischemic disease. No acute intracranial hemorrhage. No acute large vessel territory infarct. No mass lesion, midline shift or mass effect. Diffuse ventricular prominence related global parenchymal volume loss without hydrocephalus. No extra-axial fluid collection. Vascular: No hyperdense vessel. Scattered vascular calcifications noted within the carotid siphons. Skull: Scalp soft tissues and calvarium within normal limits. Sinuses/Orbits: Globes and orbital soft tissues demonstrate no acute abnormality. Mild scattered mucosal thickening within the ethmoidal air cells. Paranasal sinuses are otherwise clear. No mastoid effusion. Other: None CT CERVICAL SPINE FINDINGS Alignment: Trace anterolisthesis of C5 on C6 due to chronic facet degeneration, stable. Vertebral bodies otherwise normally aligned with preservation of the normal cervical lordosis. Skull base and vertebrae: Skull base intact. Normal C1-2 articulations are preserved in the dens is intact. Vertebral body heights maintained. C6 and C7 vertebral bodies are largely ankylosed. No acute fracture. Few scattered sclerotic lesions noted, similar to previous, which may reflect benign bone islands or be related to remote history of prostate cancer. Soft tissues  and spinal canal: No acute soft tissue abnormality within the neck. No abnormal prevertebral edema. Spinal canal within normal limits. Carotid bifurcation atherosclerosis noted. Disc levels: Moderate multilevel degenerative spondylolysis, most notable at C5-6. Prominent right-sided facet arthrosis within the upper cervical spine. Advanced degenerative changes about the C1-2 articulation. Upper chest: Visualized upper chest demonstrates no acute abnormality. Few small subcentimeter hypodense nodules noted within the thyroid, of doubtful significance. No apical pneumothorax. 4 mm nodule noted at the left lung apex, indeterminate (series 8, image 76). Other: None. IMPRESSION: CT BRAIN: 1. No acute intracranial process identified. 2. Generalized age-related cerebral atrophy with chronic small vessel ischemic disease. CT CERVICAL SPINE: 1. No acute traumatic injury within the cervical spine. 2. Moderate to advanced multilevel degenerative spondylolysis with facet arthrosis, most notable at C5-6. 3. 4 mm nodule at the left lung apex, indeterminate. No follow-up needed if patient is low-risk. Non-contrast chest CT can be considered in 12 months if patient is high-risk. This recommendation follows the consensus statement: Guidelines for Management of Incidental Pulmonary Nodules Detected on CT Images: From the Fleischner Society 2017; Radiology 2017; 284:228-243. Electronically Signed   By: Jeannine Boga M.D.   On: 08/16/2017 23:59   Dg Chest Port 1 View  Result Date: 08/16/2017 CLINICAL DATA:  Cough EXAM: PORTABLE CHEST 1 VIEW COMPARISON:  10/11/2016 FINDINGS: Linear atelectasis or scarring at  the left base. No focal pulmonary opacity or pleural effusion. Normal cardiomediastinal silhouette with aortic atherosclerosis. No pneumothorax. IMPRESSION: No active disease.  Linear scarring or atelectasis at the left base Electronically Signed   By: Donavan Foil M.D.   On: 08/16/2017 22:17    Past Medical History:   Diagnosis Date  . Anemia   . Arthritis    arthritis,osteopenia,"spinal stenosis"  . CAD (coronary artery disease)   . Cancer [facility]) 12-06-12   Prostate cancer'98  . Depression   . GERD (gastroesophageal reflux disease)   . H/O hiatal hernia   . H/O vertigo 12-06-12   none recent  . Hyperlipidemia   . IBS (irritable bowel syndrome)   . Neuropathy   . Osteopenia   . Peripheral neuropathy 12-06-12   peripheral neuropathy  . Rhinitis   . RLS (restless legs syndrome)   . Vitamin B12 deficiency      Allergies  Allergen Reactions  . Niacin Itching  . Penicillins Other (See Comments)    "rash"  . Tramadol Itching      Current Outpatient Medications on File Prior to Visit  Medication Sig Dispense Refill  . Cholecalciferol (VITAMIN D) 2000 UNITS tablet Take 8,000 Units by mouth daily.    Marland Kitchen gabapentin (NEURONTIN) 400 MG capsule Take 400 mg by mouth 3 (three) times daily.    Marland Kitchen ipratropium (ATROVENT) 0.03 % nasal spray Place 2 sprays into the nose 3 (three) times daily. (Patient taking differently: Place 2 sprays into the nose 3 (three) times daily as needed for rhinitis. ) 30 mL 2  . omeprazole (PRILOSEC) 40 MG capsule Take 1 capsule (40 mg total) by mouth every morning. Before breakfast 30 capsule 3  . propranolol (INDERAL) 20 MG tablet Take 1 tablet (20 mg total) by mouth 2 (two) times daily. 180 tablet 3  . sertraline (ZOLOFT) 100 MG tablet 1 pill daily for mood (Patient taking differently: Take 100 mg by mouth daily. ) 90 tablet 1  . simvastatin (ZOCOR) 20 MG tablet Take 1 tablet (20 mg total) by mouth every evening. 90 tablet 3   No current facility-administered medications on file prior to visit.     ROS: all negative except above.   Physical Exam: Filed Weights   08/25/17 0949  Weight: 167 lb 3.2 oz (75.8 kg)   BP 118/62   Pulse 80   Temp (!) 97.3 F (36.3 C)   Resp 16   Ht 5' 10.25" (1.784 m)   Wt 167 lb 3.2 oz (75.8 kg)   BMI 23.82 kg/m  General Appearance:  Well nourished, well dressed elder in no apparent distress. Eyes: PERRLA, EOMs, conjunctiva no swelling or erythema Sinuses: No Frontal/maxillary tenderness ENT/Mouth: Ext aud canals clear, TMs without erythema, bulging. No erythema, swelling, or exudate on post pharynx.  Tonsils not swollen or erythematous. Somewhat HOH without hearing aids  Neck: Supple, thyroid normal.  Respiratory: Respiratory effort normal, BS equal bilaterally without rales, rhonchi, wheezing or stridor.  Cardio: RRR with no MRGs. Brisk peripheral pulses without edema.  Abdomen: Soft, + BS.  Non tender, no guarding, rebound, hernias, masses. Lymphatics: Non tender without lymphadenopathy.  Musculoskeletal: Full ROM, 5/5 strength, normal gait.  Skin: Warm, dry without rashes, lesions, ecchymosis.  Neuro: Cranial nerves intact. Normal muscle tone, no cerebellar symptoms. Sensation intact.  Psych: Awake and oriented X 3, normal affect, Insight and Judgment appropriate.     Izora Ribas, NP 10:14 AM [facility]

## 2017-08-25 ENCOUNTER — Ambulatory Visit (INDEPENDENT_AMBULATORY_CARE_PROVIDER_SITE_OTHER): Payer: Medicare Other | Admitting: Adult Health

## 2017-08-25 ENCOUNTER — Encounter: Payer: Self-pay | Admitting: Adult Health

## 2017-08-25 VITALS — BP 118/62 | HR 80 | Temp 97.3°F | Resp 16 | Ht 70.25 in | Wt 167.2 lb

## 2017-08-25 DIAGNOSIS — G9340 Encephalopathy, unspecified: Secondary | ICD-10-CM | POA: Diagnosis not present

## 2017-08-25 DIAGNOSIS — Z79899 Other long term (current) drug therapy: Secondary | ICD-10-CM

## 2017-08-25 DIAGNOSIS — N183 Chronic kidney disease, stage 3 unspecified: Secondary | ICD-10-CM

## 2017-08-25 DIAGNOSIS — E559 Vitamin D deficiency, unspecified: Secondary | ICD-10-CM

## 2017-08-25 DIAGNOSIS — E782 Mixed hyperlipidemia: Secondary | ICD-10-CM

## 2017-08-25 DIAGNOSIS — I10 Essential (primary) hypertension: Secondary | ICD-10-CM

## 2017-08-25 DIAGNOSIS — J101 Influenza due to other identified influenza virus with other respiratory manifestations: Secondary | ICD-10-CM

## 2017-08-25 NOTE — Patient Instructions (Signed)

## 2017-08-26 LAB — HEPATIC FUNCTION PANEL
AG RATIO: 1.8 (calc) (ref 1.0–2.5)
ALBUMIN MSPROF: 3.7 g/dL (ref 3.6–5.1)
ALT: 6 U/L — AB (ref 9–46)
AST: 9 U/L — AB (ref 10–35)
Alkaline phosphatase (APISO): 73 U/L (ref 40–115)
BILIRUBIN DIRECT: 0.2 mg/dL (ref 0.0–0.2)
BILIRUBIN TOTAL: 0.6 mg/dL (ref 0.2–1.2)
GLOBULIN: 2.1 g/dL (ref 1.9–3.7)
Indirect Bilirubin: 0.4 mg/dL (calc) (ref 0.2–1.2)
Total Protein: 5.8 g/dL — ABNORMAL LOW (ref 6.1–8.1)

## 2017-08-26 LAB — CBC WITH DIFFERENTIAL/PLATELET
BASOS ABS: 20 {cells}/uL (ref 0–200)
Basophils Relative: 0.3 %
EOS PCT: 0.7 %
Eosinophils Absolute: 48 cells/uL (ref 15–500)
HCT: 36 % — ABNORMAL LOW (ref 38.5–50.0)
HEMOGLOBIN: 12.7 g/dL — AB (ref 13.2–17.1)
Lymphs Abs: 898 cells/uL (ref 850–3900)
MCH: 32.4 pg (ref 27.0–33.0)
MCHC: 35.3 g/dL (ref 32.0–36.0)
MCV: 91.8 fL (ref 80.0–100.0)
MONOS PCT: 7.1 %
MPV: 10.9 fL (ref 7.5–12.5)
NEUTROS ABS: 5352 {cells}/uL (ref 1500–7800)
Neutrophils Relative %: 78.7 %
PLATELETS: 155 10*3/uL (ref 140–400)
RBC: 3.92 10*6/uL — ABNORMAL LOW (ref 4.20–5.80)
RDW: 12 % (ref 11.0–15.0)
Total Lymphocyte: 13.2 %
WBC mixed population: 483 cells/uL (ref 200–950)
WBC: 6.8 10*3/uL (ref 3.8–10.8)

## 2017-08-26 LAB — LIPID PANEL
CHOL/HDL RATIO: 5 (calc) — AB (ref <5.0)
Cholesterol: 154 mg/dL (ref <200)
HDL: 31 mg/dL — ABNORMAL LOW (ref >40)
LDL CHOLESTEROL (CALC): 103 mg/dL — AB
Non-HDL Cholesterol (Calc): 123 mg/dL (calc) (ref <130)
Triglycerides: 108 mg/dL (ref <150)

## 2017-08-26 LAB — BASIC METABOLIC PANEL WITH GFR
BUN/Creatinine Ratio: 14 (calc) (ref 6–22)
BUN: 21 mg/dL (ref 7–25)
CALCIUM: 8.7 mg/dL (ref 8.6–10.3)
CO2: 31 mmol/L (ref 20–32)
CREATININE: 1.51 mg/dL — AB (ref 0.70–1.11)
Chloride: 107 mmol/L (ref 98–110)
GFR, EST AFRICAN AMERICAN: 48 mL/min/{1.73_m2} — AB (ref >=60)
GFR, EST NON AFRICAN AMERICAN: 41 mL/min/{1.73_m2} — AB (ref >=60)
Glucose, Bld: 119 mg/dL — ABNORMAL HIGH (ref 65–99)
Potassium: 4.3 mmol/L (ref 3.5–5.3)
Sodium: 143 mmol/L (ref 135–146)

## 2017-08-26 LAB — TSH: TSH: 2.14 m[IU]/L (ref 0.40–4.50)

## 2017-08-26 LAB — VITAMIN D 25 HYDROXY (VIT D DEFICIENCY, FRACTURES): VIT D 25 HYDROXY: 46 ng/mL (ref 30–100)

## 2017-08-30 DIAGNOSIS — J09X2 Influenza due to identified novel influenza A virus with other respiratory manifestations: Secondary | ICD-10-CM | POA: Diagnosis not present

## 2017-08-30 DIAGNOSIS — I2510 Atherosclerotic heart disease of native coronary artery without angina pectoris: Secondary | ICD-10-CM | POA: Diagnosis not present

## 2017-08-31 DIAGNOSIS — I2510 Atherosclerotic heart disease of native coronary artery without angina pectoris: Secondary | ICD-10-CM | POA: Diagnosis not present

## 2017-08-31 DIAGNOSIS — J09X2 Influenza due to identified novel influenza A virus with other respiratory manifestations: Secondary | ICD-10-CM | POA: Diagnosis not present

## 2017-09-05 DIAGNOSIS — I2510 Atherosclerotic heart disease of native coronary artery without angina pectoris: Secondary | ICD-10-CM | POA: Diagnosis not present

## 2017-09-05 DIAGNOSIS — J09X2 Influenza due to identified novel influenza A virus with other respiratory manifestations: Secondary | ICD-10-CM | POA: Diagnosis not present

## 2017-09-06 DIAGNOSIS — J09X2 Influenza due to identified novel influenza A virus with other respiratory manifestations: Secondary | ICD-10-CM | POA: Diagnosis not present

## 2017-09-06 DIAGNOSIS — I2510 Atherosclerotic heart disease of native coronary artery without angina pectoris: Secondary | ICD-10-CM | POA: Diagnosis not present

## 2017-09-07 DIAGNOSIS — J09X2 Influenza due to identified novel influenza A virus with other respiratory manifestations: Secondary | ICD-10-CM | POA: Diagnosis not present

## 2017-09-07 DIAGNOSIS — I2510 Atherosclerotic heart disease of native coronary artery without angina pectoris: Secondary | ICD-10-CM | POA: Diagnosis not present

## 2017-09-12 DIAGNOSIS — I2510 Atherosclerotic heart disease of native coronary artery without angina pectoris: Secondary | ICD-10-CM | POA: Diagnosis not present

## 2017-09-12 DIAGNOSIS — J09X2 Influenza due to identified novel influenza A virus with other respiratory manifestations: Secondary | ICD-10-CM | POA: Diagnosis not present

## 2017-09-14 DIAGNOSIS — J09X2 Influenza due to identified novel influenza A virus with other respiratory manifestations: Secondary | ICD-10-CM | POA: Diagnosis not present

## 2017-09-14 DIAGNOSIS — I2510 Atherosclerotic heart disease of native coronary artery without angina pectoris: Secondary | ICD-10-CM | POA: Diagnosis not present

## 2017-09-19 DIAGNOSIS — J09X2 Influenza due to identified novel influenza A virus with other respiratory manifestations: Secondary | ICD-10-CM | POA: Diagnosis not present

## 2017-09-19 DIAGNOSIS — I2510 Atherosclerotic heart disease of native coronary artery without angina pectoris: Secondary | ICD-10-CM | POA: Diagnosis not present

## 2017-09-21 DIAGNOSIS — J09X2 Influenza due to identified novel influenza A virus with other respiratory manifestations: Secondary | ICD-10-CM | POA: Diagnosis not present

## 2017-09-21 DIAGNOSIS — I2510 Atherosclerotic heart disease of native coronary artery without angina pectoris: Secondary | ICD-10-CM | POA: Diagnosis not present

## 2017-09-25 DIAGNOSIS — M25562 Pain in left knee: Secondary | ICD-10-CM | POA: Diagnosis not present

## 2017-09-26 DIAGNOSIS — I2510 Atherosclerotic heart disease of native coronary artery without angina pectoris: Secondary | ICD-10-CM | POA: Diagnosis not present

## 2017-09-26 DIAGNOSIS — J09X2 Influenza due to identified novel influenza A virus with other respiratory manifestations: Secondary | ICD-10-CM | POA: Diagnosis not present

## 2017-09-28 DIAGNOSIS — J09X2 Influenza due to identified novel influenza A virus with other respiratory manifestations: Secondary | ICD-10-CM | POA: Diagnosis not present

## 2017-09-28 DIAGNOSIS — I2510 Atherosclerotic heart disease of native coronary artery without angina pectoris: Secondary | ICD-10-CM | POA: Diagnosis not present

## 2017-09-28 DIAGNOSIS — Z96651 Presence of right artificial knee joint: Secondary | ICD-10-CM | POA: Diagnosis not present

## 2017-09-28 DIAGNOSIS — M25661 Stiffness of right knee, not elsewhere classified: Secondary | ICD-10-CM | POA: Diagnosis not present

## 2017-09-28 DIAGNOSIS — M25561 Pain in right knee: Secondary | ICD-10-CM | POA: Diagnosis not present

## 2017-09-28 DIAGNOSIS — R262 Difficulty in walking, not elsewhere classified: Secondary | ICD-10-CM | POA: Diagnosis not present

## 2017-10-04 DIAGNOSIS — M1712 Unilateral primary osteoarthritis, left knee: Secondary | ICD-10-CM | POA: Diagnosis not present

## 2017-10-04 DIAGNOSIS — Z96651 Presence of right artificial knee joint: Secondary | ICD-10-CM | POA: Diagnosis not present

## 2017-10-04 DIAGNOSIS — M6281 Muscle weakness (generalized): Secondary | ICD-10-CM | POA: Diagnosis not present

## 2017-10-04 DIAGNOSIS — Z9181 History of falling: Secondary | ICD-10-CM | POA: Diagnosis not present

## 2017-10-06 DIAGNOSIS — Z9181 History of falling: Secondary | ICD-10-CM | POA: Diagnosis not present

## 2017-10-06 DIAGNOSIS — M6281 Muscle weakness (generalized): Secondary | ICD-10-CM | POA: Diagnosis not present

## 2017-10-06 DIAGNOSIS — M1712 Unilateral primary osteoarthritis, left knee: Secondary | ICD-10-CM | POA: Diagnosis not present

## 2017-10-06 DIAGNOSIS — Z96651 Presence of right artificial knee joint: Secondary | ICD-10-CM | POA: Diagnosis not present

## 2017-10-12 DIAGNOSIS — Z9181 History of falling: Secondary | ICD-10-CM | POA: Diagnosis not present

## 2017-10-12 DIAGNOSIS — M1712 Unilateral primary osteoarthritis, left knee: Secondary | ICD-10-CM | POA: Diagnosis not present

## 2017-10-12 DIAGNOSIS — M6281 Muscle weakness (generalized): Secondary | ICD-10-CM | POA: Diagnosis not present

## 2017-10-12 DIAGNOSIS — Z96651 Presence of right artificial knee joint: Secondary | ICD-10-CM | POA: Diagnosis not present

## 2017-10-16 DIAGNOSIS — M6281 Muscle weakness (generalized): Secondary | ICD-10-CM | POA: Diagnosis not present

## 2017-10-16 DIAGNOSIS — Z96651 Presence of right artificial knee joint: Secondary | ICD-10-CM | POA: Diagnosis not present

## 2017-10-16 DIAGNOSIS — M1712 Unilateral primary osteoarthritis, left knee: Secondary | ICD-10-CM | POA: Diagnosis not present

## 2017-10-16 DIAGNOSIS — Z9181 History of falling: Secondary | ICD-10-CM | POA: Diagnosis not present

## 2017-10-18 DIAGNOSIS — M1712 Unilateral primary osteoarthritis, left knee: Secondary | ICD-10-CM | POA: Diagnosis not present

## 2017-10-18 DIAGNOSIS — M6281 Muscle weakness (generalized): Secondary | ICD-10-CM | POA: Diagnosis not present

## 2017-10-18 DIAGNOSIS — Z96651 Presence of right artificial knee joint: Secondary | ICD-10-CM | POA: Diagnosis not present

## 2017-10-18 DIAGNOSIS — Z9181 History of falling: Secondary | ICD-10-CM | POA: Diagnosis not present

## 2017-10-21 ENCOUNTER — Other Ambulatory Visit: Payer: Self-pay

## 2017-10-21 ENCOUNTER — Emergency Department (HOSPITAL_COMMUNITY): Payer: Medicare Other

## 2017-10-21 ENCOUNTER — Encounter (HOSPITAL_COMMUNITY): Payer: Self-pay

## 2017-10-21 ENCOUNTER — Emergency Department (HOSPITAL_COMMUNITY)
Admission: EM | Admit: 2017-10-21 | Discharge: 2017-10-21 | Disposition: A | Discharge status: Home / Self Care | Payer: Medicare Other | Attending: Emergency Medicine | Admitting: Emergency Medicine

## 2017-10-21 DIAGNOSIS — H539 Unspecified visual disturbance: Secondary | ICD-10-CM | POA: Diagnosis not present

## 2017-10-21 DIAGNOSIS — N183 Chronic kidney disease, stage 3 (moderate): Secondary | ICD-10-CM | POA: Diagnosis not present

## 2017-10-21 DIAGNOSIS — R4701 Aphasia: Secondary | ICD-10-CM | POA: Diagnosis not present

## 2017-10-21 DIAGNOSIS — H53129 Transient visual loss, unspecified eye: Secondary | ICD-10-CM | POA: Diagnosis not present

## 2017-10-21 DIAGNOSIS — I2510 Atherosclerotic heart disease of native coronary artery without angina pectoris: Secondary | ICD-10-CM | POA: Insufficient documentation

## 2017-10-21 DIAGNOSIS — R479 Unspecified speech disturbances: Secondary | ICD-10-CM | POA: Diagnosis not present

## 2017-10-21 DIAGNOSIS — Z79899 Other long term (current) drug therapy: Secondary | ICD-10-CM | POA: Diagnosis not present

## 2017-10-21 DIAGNOSIS — H538 Other visual disturbances: Secondary | ICD-10-CM | POA: Diagnosis not present

## 2017-10-21 DIAGNOSIS — I129 Hypertensive chronic kidney disease with stage 1 through stage 4 chronic kidney disease, or unspecified chronic kidney disease: Secondary | ICD-10-CM | POA: Diagnosis not present

## 2017-10-21 DIAGNOSIS — Z87891 Personal history of nicotine dependence: Secondary | ICD-10-CM | POA: Insufficient documentation

## 2017-10-21 DIAGNOSIS — Z96651 Presence of right artificial knee joint: Secondary | ICD-10-CM | POA: Diagnosis not present

## 2017-10-21 DIAGNOSIS — R4789 Other speech disturbances: Secondary | ICD-10-CM | POA: Diagnosis not present

## 2017-10-21 DIAGNOSIS — J111 Influenza due to unidentified influenza virus with other respiratory manifestations: Secondary | ICD-10-CM | POA: Diagnosis not present

## 2017-10-21 LAB — COMPREHENSIVE METABOLIC PANEL
ALT: 9 U/L — AB (ref 17–63)
AST: 13 U/L — AB (ref 15–41)
Albumin: 3.8 g/dL (ref 3.5–5.0)
Alkaline Phosphatase: 61 U/L (ref 38–126)
Anion gap: 7 (ref 5–15)
BUN: 14 mg/dL (ref 6–20)
CHLORIDE: 107 mmol/L (ref 101–111)
CO2: 29 mmol/L (ref 22–32)
CREATININE: 1.46 mg/dL — AB (ref 0.61–1.24)
Calcium: 8.9 mg/dL (ref 8.9–10.3)
GFR calc Af Amer: 48 mL/min — ABNORMAL LOW (ref >60)
GFR calc non Af Amer: 42 mL/min — ABNORMAL LOW (ref >60)
Glucose, Bld: 97 mg/dL (ref 65–99)
POTASSIUM: 3.8 mmol/L (ref 3.5–5.1)
SODIUM: 143 mmol/L (ref 135–145)
Total Bilirubin: 1 mg/dL (ref 0.3–1.2)
Total Protein: 6.2 g/dL — ABNORMAL LOW (ref 6.5–8.1)

## 2017-10-21 LAB — URINALYSIS, ROUTINE W REFLEX MICROSCOPIC
Bilirubin Urine: NEGATIVE
Glucose, UA: NEGATIVE mg/dL
Hgb urine dipstick: NEGATIVE
Ketones, ur: NEGATIVE mg/dL
Leukocytes, UA: NEGATIVE
NITRITE: NEGATIVE
PH: 5 (ref 5.0–8.0)
Protein, ur: NEGATIVE mg/dL
Specific Gravity, Urine: 1.012 (ref 1.005–1.030)

## 2017-10-21 LAB — CBC
HCT: 38.1 % — ABNORMAL LOW (ref 39.0–52.0)
Hemoglobin: 12.7 g/dL — ABNORMAL LOW (ref 13.0–17.0)
MCH: 31.5 pg (ref 26.0–34.0)
MCHC: 33.3 g/dL (ref 30.0–36.0)
MCV: 94.5 fL (ref 78.0–100.0)
Platelets: 130 10*3/uL — ABNORMAL LOW (ref 150–400)
RBC: 4.03 MIL/uL — ABNORMAL LOW (ref 4.22–5.81)
RDW: 13.1 % (ref 11.5–15.5)
WBC: 7.3 10*3/uL (ref 4.0–10.5)

## 2017-10-21 LAB — PROTIME-INR
INR: 1.09
PROTHROMBIN TIME: 14 s (ref 11.4–15.2)

## 2017-10-21 LAB — DIFFERENTIAL
BASOS ABS: 0 10*3/uL (ref 0.0–0.1)
BASOS PCT: 0 %
Eosinophils Absolute: 0.1 10*3/uL (ref 0.0–0.7)
Eosinophils Relative: 1 %
Lymphocytes Relative: 15 %
Lymphs Abs: 1.1 10*3/uL (ref 0.7–4.0)
Monocytes Absolute: 0.3 10*3/uL (ref 0.1–1.0)
Monocytes Relative: 4 %
NEUTROS ABS: 5.9 10*3/uL (ref 1.7–7.7)
NEUTROS PCT: 80 %

## 2017-10-21 LAB — I-STAT TROPONIN, ED: Troponin i, poc: 0 ng/mL (ref 0.00–0.08)

## 2017-10-21 LAB — APTT: APTT: 26 s (ref 24–36)

## 2017-10-21 NOTE — Discharge Instructions (Signed)
Please follow-up with your neurologist and your PCP in several days for reassessment.  Please stay hydrated.   if you have any new or worsened symptoms, please return to the nearest emergency department.

## 2017-10-21 NOTE — ED Triage Notes (Signed)
Pt endorses visual field change this morning beginning around 0900 where the pt states "I couldn't see anything in the middle" Since then his vision has returned to normal. NO neuro deficits. VSS>

## 2017-10-21 NOTE — ED Notes (Signed)
Pt trying to give urine sample at this time.

## 2017-10-21 NOTE — ED Provider Notes (Signed)
[facility] EMERGENCY DEPARTMENT Provider Note   CSN: [id number] Arrival date & time: 10/21/17  1102     History   Chief Complaint Chief Complaint  Patient presents with  . Visual Field Change    HPI Ryan Humphrey is a 84 y.o. male.  The history is provided by the patient and medical records. No language interpreter was used.  Neurologic Problem  This is a recurrent problem. The current episode started 6 to 12 hours ago. The problem occurs rarely. The problem has been resolved. Pertinent negatives include no chest pain, no abdominal pain, no headaches and no shortness of breath. Nothing aggravates the symptoms. Nothing relieves the symptoms. He has tried nothing for the symptoms. The treatment provided no relief.    Past Medical History:  Diagnosis Date  . Anemia   . Arthritis    arthritis,osteopenia,"spinal stenosis"  . CAD (coronary artery disease)   . Cancer [facility]) 12-06-12   Prostate cancer'98  . Depression   . GERD (gastroesophageal reflux disease)   . H/O hiatal hernia   . H/O vertigo 12-06-12   none recent  . Hyperlipidemia   . IBS (irritable bowel syndrome)   . Neuropathy   . Osteopenia   . Peripheral neuropathy 12-06-12   peripheral neuropathy  . Rhinitis   . RLS (restless legs syndrome)   . Vitamin B12 deficiency     Patient Active Problem List   Diagnosis Date Noted  . CKD (chronic kidney disease) stage 3, GFR 30-59 ml/min (HCC) 08/17/2017  . History of TIA (transient ischemic attack) 11/05/2016  . Vitamin B12 deficiency 11/05/2016  . GERD (gastroesophageal reflux disease) 11/05/2016  . MCI (mild cognitive impairment) 08/20/2015  . Hereditary and idiopathic peripheral neuropathy 08/20/2015  . Benign essential tremor 08/20/2015  . Vitamin D deficiency 01/06/2015  . Medication management 01/06/2015  . Primary osteoarthritis of right knee 11/25/2014  . Bladder neck obstruction 03/28/2011  . Malignant neoplasm of prostate ([place])  03/28/2011  . INTERMITTENT VERTIGO 10/12/2009  . Mixed hyperlipidemia 04/10/2009  . Essential hypertension, benign 04/10/2009  . CORONARY ATHEROSCLEROSIS NATIVE CORONARY ARTERY 04/10/2009    Past Surgical History:  Procedure Laterality Date  . APPENDECTOMY    . CARPAL TUNNEL RELEASE     both hands  . cataract surgery Left 12-06-12   recent surgery  . CHOLECYSTECTOMY    . COLONOSCOPY WITH PROPOFOL N/A 12/25/2012   Procedure: COLONOSCOPY WITH PROPOFOL;  Surgeon: Garlan Fair, MD;  Location: [facility];  Service: Endoscopy;  Laterality: N/A;  . ESOPHAGOGASTRODUODENOSCOPY (EGD) WITH PROPOFOL N/A 12/25/2012   Procedure: ESOPHAGOGASTRODUODENOSCOPY (EGD) WITH PROPOFOL;  Surgeon: Garlan Fair, MD;  Location: [facility];  Service: Endoscopy;  Laterality: N/A;  . PROSTATE SURGERY  12-06-12  . TONSILLECTOMY    . TOTAL KNEE ARTHROPLASTY Right 11/25/2014   Procedure: RIGHT TOTAL KNEE ARTHROPLASTY;  Surgeon: Melrose Nakayama, MD;  Location: [facility];  Service: Orthopedics;  Laterality: Right;        Home Medications    Prior to Admission medications   Medication Sig Start Date End Date Taking? Authorizing Provider  Cholecalciferol (VITAMIN D) 2000 UNITS tablet Take 8,000 Units by mouth daily.    [provider]  gabapentin (NEURONTIN) 400 MG capsule Take 400 mg by mouth 3 (three) times daily.    [provider]  ipratropium (ATROVENT) 0.03 % nasal spray Place 2 sprays into the nose 3 (three) times daily. Patient taking differently: Place 2 sprays into the nose 3 (three)  times daily as needed for rhinitis.  11/10/16 11/10/17  Vicie Mutters, PA-C  omeprazole (PRILOSEC) 40 MG capsule Take 1 capsule (40 mg total) by mouth every morning. Before breakfast 04/05/17   Willia Craze, NP  propranolol (INDERAL) 20 MG tablet Take 1 tablet (20 mg total) by mouth 2 (two) times daily. 10/14/15   Unk Pinto, MD  sertraline (ZOLOFT) 100 MG tablet 1 pill daily for mood Patient  taking differently: Take 100 mg by mouth daily.  02/15/17 02/15/18  Vicie Mutters, PA-C  simvastatin (ZOCOR) 20 MG tablet Take 1 tablet (20 mg total) by mouth every evening. 10/14/15   Unk Pinto, MD    Family History Family History  Problem Relation Age of Onset  . CAD Father        Deceased, 79  . Dementia Mother        Deceased, 48  . Diabetes Brother   . Hyperlipidemia Brother   . Hypertension Brother   . Dementia Brother   . Colon cancer Neg Hx   . Colon polyps Neg Hx   . Kidney disease Neg Hx   . Esophageal cancer Neg Hx   . Gallbladder disease Neg Hx     Social History Social History   Tobacco Use  . Smoking status: Former Smoker    Packs/day: 0.50    Years: 30.00    Pack years: 15.00    Last attempt to quit: 06/14/1963    Years since quitting: 54.3  . Smokeless tobacco: Never Used  Substance Use Topics  . Alcohol use: Yes    Alcohol/week: 0.0 oz    Comment: Socially  . Drug use: No     Allergies   Niacin; Penicillins; and Tramadol   Review of Systems Review of Systems  Constitutional: Negative for chills, diaphoresis, fatigue and fever.  HENT: Negative for congestion.   Eyes: Positive for visual disturbance.  Respiratory: Negative for cough, chest tightness, shortness of breath and wheezing.   Cardiovascular: Negative for chest pain and palpitations.  Gastrointestinal: Negative for abdominal pain, constipation, diarrhea, nausea and vomiting.  Genitourinary: Negative for flank pain and frequency.  Musculoskeletal: Negative for back pain, neck pain and neck stiffness.  Neurological: Positive for speech difficulty. Negative for dizziness, weakness, light-headedness, numbness and headaches.  Psychiatric/Behavioral: Negative for agitation and confusion.  All other systems reviewed and are negative.    Physical Exam Updated Vital Signs BP (!) 166/76   Pulse 61   Temp 98 F (36.7 C) (Oral)   Resp 11   SpO2 100%   Physical Exam  Constitutional:  He is oriented to person, place, and time. He appears well-developed and well-nourished. No distress.  HENT:  Head: Normocephalic and atraumatic.  Mouth/Throat: Oropharynx is clear and moist. No oropharyngeal exudate.  Eyes: Pupils are equal, round, and reactive to light. Conjunctivae and EOM are normal.  Neck: Normal range of motion. Neck supple.  Cardiovascular: Normal rate and regular rhythm.  No murmur heard. Pulmonary/Chest: Effort normal and breath sounds normal. No respiratory distress. He has no wheezes. He exhibits no tenderness.  Abdominal: Soft. He exhibits no distension. There is no tenderness.  Musculoskeletal: He exhibits no edema.  Neurological: He is alert and oriented to person, place, and time. No cranial nerve deficit or sensory deficit. He exhibits normal muscle tone. Coordination normal.  Skin: Skin is warm. Capillary refill takes less than 2 seconds. He is not diaphoretic. No erythema. No pallor.  Psychiatric: He has a normal mood and affect.  Nursing note and vitals reviewed.    ED Treatments / Results  Labs (all labs ordered are listed, but only abnormal results are displayed) Labs Reviewed  CBC - Abnormal; Notable for the following components:      Result Value   RBC 4.03 (*)    Hemoglobin 12.7 (*)    HCT 38.1 (*)    Platelets 130 (*)    All other components within normal limits  COMPREHENSIVE METABOLIC PANEL - Abnormal; Notable for the following components:   Creatinine, Ser 1.46 (*)    Total Protein 6.2 (*)    AST 13 (*)    ALT 9 (*)    GFR calc non Af Amer 42 (*)    GFR calc Af Amer 48 (*)    All other components within normal limits  PROTIME-INR  APTT  DIFFERENTIAL  URINALYSIS, ROUTINE W REFLEX MICROSCOPIC  I-STAT TROPONIN, ED    EKG None  Radiology Dg Chest 2 View  Result Date: 10/21/2017 CLINICAL DATA:  TIA like symptoms. Visual disturbance. Recent admission for flu. EXAM: CHEST - 2 VIEW COMPARISON:  08/16/2017 and prior radiographs  FINDINGS: UPPER limits normal heart size noted. Mild chronic peribronchial thickening again noted. There is no evidence of focal airspace disease, pulmonary edema, suspicious pulmonary nodule/mass, pleural effusion, or pneumothorax. No acute bony abnormalities are identified. IMPRESSION: No evidence of acute intracranial abnormality. Electronically Signed   By: Margarette Canada M.D.   On: 10/21/2017 15:55   Ct Head Wo Contrast  Result Date: 10/21/2017 CLINICAL DATA:  Pt endorses visual field change this morning beginning around 0900. EXAM: CT HEAD WITHOUT CONTRAST TECHNIQUE: Contiguous axial images were obtained from the base of the skull through the vertex without intravenous contrast. COMPARISON:  CT 01/03/2015, MRI 11/04/2016 FINDINGS: Brain: There is central and cortical atrophy. Periventricular white matter changes are consistent with small vessel disease. There is no intra or extra-axial fluid collection or mass lesion. The basilar cisterns and ventricles have a normal appearance. There is no CT evidence for acute infarction or hemorrhage. Vascular: There is atherosclerotic calcifications of the carotic siphons. Skull: Normal. Negative for fracture or focal lesion. Sinuses/Orbits: There is mild mucoperiosteal thickening of the ethmoid air cells. Small RIGHT mastoid effusion. Orbits are intact. Other: None IMPRESSION: 1. Atrophy and small vessel disease. 2.  No evidence for acute intracranial abnormality. 3. Small RIGHT mastoid effusion. 4. Minimal sinus disease. Electronically Signed   By: Nolon Nations M.D.   On: 10/21/2017 13:08   Mr Jodene Nam Head Wo Contrast  Result Date: 10/21/2017 CLINICAL DATA:  85 year old male with onset of abnormal vision at 0830 hours today, symptoms gradually resolved. Subsequent brief episode of word-finding difficulty. EXAM: MRI HEAD WITHOUT CONTRAST MRA HEAD WITHOUT CONTRAST TECHNIQUE: Multiplanar, multiecho pulse sequences of the brain and surrounding structures were obtained  without intravenous contrast. Angiographic images of the head were obtained using MRA technique without contrast. COMPARISON:  Brain MRI 11/04/2016.  Cervical spine MRI 11/05/2016. FINDINGS: MRI HEAD FINDINGS Brain: Stable cerebral volume and chronic ventriculomegaly since 2018. No restricted diffusion to suggest acute infarction. No midline shift, mass effect, evidence of mass lesion, extra-axial collection or acute intracranial hemorrhage. Cervicomedullary junction and pituitary are within normal limits. Mild for age periventricular and other scattered cerebral white matter T2 and FLAIR hyperintensity is stable and nonspecific. Similar patchy T2 hyperintensity in the pons is stable. However, there are new chronic blood products in the left middle frontal gyrus since May 2018. These are on susceptibility weighted imaging  today series 16001, image 36, but were not evident on 3T susceptibility weighted imaging last year. Part of the appearance is curvilinear, but this appears to be subcortical rather than along the surface of the brain. There also appear to be several new small foci of chronic microhemorrhage in both occipital lobes (series 16001, image 26) which were not evident before. No associated T2 or FLAIR signal changes with these findings. The deep gray matter nuclei appear stable and normal for age. The cerebellum remains normal. Vascular: Major intracranial vascular flow voids are stable. Mild intracranial artery tortuosity. Skull and upper cervical spine: Chronic cervical spinal stenosis at C3-C4 redemonstrated. See 11/05/2016 cervical spine MRI. Normal bone marrow signal. Sinuses/Orbits: Stable postoperative changes to the globes and MRI appearance of the orbits soft tissues. Paranasal Visualized paranasal sinuses and mastoids are stable and well pneumatized. Other: Visible internal auditory structures appear normal. Scalp and face soft tissues appear negative. MRA HEAD FINDINGS Antegrade flow in the  posterior circulation with dominant distal left vertebral artery. Normal right PICA. The left AICA appears dominant. No distal vertebral stenosis. No basilar stenosis. Normal SCA and left PCA origins. Fetal type right PCA origin. The left posterior communicating artery is diminutive or absent. There is mild left PCA branch irregularity. There is up to moderate irregularity and stenosis in the right PCA P2 segment (series 1041001, image 13). The distal right PCA branches are normal. Antegrade flow in both ICA siphons. Evidence of calcified siphon atherosclerosis but no siphon stenosis. Ophthalmic and right posterior communicating artery origins appear normal. Patent carotid termini with normal MCA and ACA origins. Anterior communicating artery is normal. There is mild bilateral ACA A2 segment irregularity and stenosis. The bilateral MCA M1 segments, MCA bi/trifurcations, and MCA branches are within normal limits. IMPRESSION: 1. No acute infarct or acute intracranial abnormality. However, several small areas of chronic hemorrhage in the anterior left MCA and bilateral PCA vascular territories are new since the 2018 MRI. These appear nonspecific, and without encephalomalacia or other corresponding discrete parenchymal signal changes. 2. Otherwise stable noncontrast MRI appearance of the brain with fairly mild for age nonspecific signal changes in the cerebral white matter and pons. 3. Intracranial MRA demonstrates only mild for age intracranial atherosclerosis; there is up to moderate stenosis of the right PCA P2 segment, but minimal to mild intracranial stenoses otherwise. Electronically Signed   By: Genevie Ann M.D.   On: 10/21/2017 20:19   Mr Brain Wo Contrast  Result Date: 10/21/2017 CLINICAL DATA:  84 year old male with onset of abnormal vision at 0830 hours today, symptoms gradually resolved. Subsequent brief episode of word-finding difficulty. EXAM: MRI HEAD WITHOUT CONTRAST MRA HEAD WITHOUT CONTRAST TECHNIQUE:  Multiplanar, multiecho pulse sequences of the brain and surrounding structures were obtained without intravenous contrast. Angiographic images of the head were obtained using MRA technique without contrast. COMPARISON:  Brain MRI 11/04/2016.  Cervical spine MRI 11/05/2016. FINDINGS: MRI HEAD FINDINGS Brain: Stable cerebral volume and chronic ventriculomegaly since 2018. No restricted diffusion to suggest acute infarction. No midline shift, mass effect, evidence of mass lesion, extra-axial collection or acute intracranial hemorrhage. Cervicomedullary junction and pituitary are within normal limits. Mild for age periventricular and other scattered cerebral white matter T2 and FLAIR hyperintensity is stable and nonspecific. Similar patchy T2 hyperintensity in the pons is stable. However, there are new chronic blood products in the left middle frontal gyrus since May 2018. These are on susceptibility weighted imaging today series 16001, image 36, but were not evident on 3T  susceptibility weighted imaging last year. Part of the appearance is curvilinear, but this appears to be subcortical rather than along the surface of the brain. There also appear to be several new small foci of chronic microhemorrhage in both occipital lobes (series 16001, image 26) which were not evident before. No associated T2 or FLAIR signal changes with these findings. The deep gray matter nuclei appear stable and normal for age. The cerebellum remains normal. Vascular: Major intracranial vascular flow voids are stable. Mild intracranial artery tortuosity. Skull and upper cervical spine: Chronic cervical spinal stenosis at C3-C4 redemonstrated. See 11/05/2016 cervical spine MRI. Normal bone marrow signal. Sinuses/Orbits: Stable postoperative changes to the globes and MRI appearance of the orbits soft tissues. Paranasal Visualized paranasal sinuses and mastoids are stable and well pneumatized. Other: Visible internal auditory structures appear  normal. Scalp and face soft tissues appear negative. MRA HEAD FINDINGS Antegrade flow in the posterior circulation with dominant distal left vertebral artery. Normal right PICA. The left AICA appears dominant. No distal vertebral stenosis. No basilar stenosis. Normal SCA and left PCA origins. Fetal type right PCA origin. The left posterior communicating artery is diminutive or absent. There is mild left PCA branch irregularity. There is up to moderate irregularity and stenosis in the right PCA P2 segment (series 1041001, image 13). The distal right PCA branches are normal. Antegrade flow in both ICA siphons. Evidence of calcified siphon atherosclerosis but no siphon stenosis. Ophthalmic and right posterior communicating artery origins appear normal. Patent carotid termini with normal MCA and ACA origins. Anterior communicating artery is normal. There is mild bilateral ACA A2 segment irregularity and stenosis. The bilateral MCA M1 segments, MCA bi/trifurcations, and MCA branches are within normal limits. IMPRESSION: 1. No acute infarct or acute intracranial abnormality. However, several small areas of chronic hemorrhage in the anterior left MCA and bilateral PCA vascular territories are new since the 2018 MRI. These appear nonspecific, and without encephalomalacia or other corresponding discrete parenchymal signal changes. 2. Otherwise stable noncontrast MRI appearance of the brain with fairly mild for age nonspecific signal changes in the cerebral white matter and pons. 3. Intracranial MRA demonstrates only mild for age intracranial atherosclerosis; there is up to moderate stenosis of the right PCA P2 segment, but minimal to mild intracranial stenoses otherwise. Electronically Signed   By: Genevie Ann M.D.   On: 10/21/2017 20:19    Procedures Procedures (including critical care time)  Medications Ordered in ED Medications - No data to display   Initial Impression / Assessment and Plan / ED Course  I have  reviewed the triage vital signs and the nursing notes.  Pertinent labs & imaging results that were available during my care of the patient were reviewed by me and considered in my medical decision making (see chart for details).     DEONTRAE DRINKARD is a 84 y.o. male with a past medical history significant for prior TIA, hypertension, hyperkalemia, CAD, and prostate cancer who presents with transient vision loss and speech abnormality.  Patient reports that at partially 9 AM he had onset of vision loss.  He reports it was a band across his central vision.  He reports this is similar to prior TIA.  He reports that this lasted approximate 1 hour prior to resolving.  He says that he also had some word finding difficulty which was confirmed by family who accompanied patient.  He denies any headaches, facial droop, numbness, tingling, or weakness of extremities.  Patient denies any chest pain, shortness  breath, palpitations, nausea vomiting, urinary or GI symptoms.  He denies recent trauma.  Next  On further history, patient does report that he has not taken any of his medications in the last few months.  Patient was admitted  2 months ago for altered mental status in the setting of influenza.  On exam, no focal neurologic deficits were seen.  No facial droop, pupils reactive bilaterally.  Normal extraocular movements.  Normal sensation and strength throughout.  Normal coordination with finger to finger testing.  Lungs clear and chest nontender.  Abdomen nontender.  Patient otherwise appears well.  Next  Clinically I am concerned about TIA for the patient as he is not been on any of his medicines.  As he was recently admitted for infection, will look for occult infection which may have caused recurrence of his TIA like symptoms.  Patient had CT head which showed chronic microvascular changes but no evidence of acute intracranial abnormality.  EKG showed no STEMI.  Neurology will be called as I am concerned  about TIA.  We will also obtain laboratory testing and imaging to rule out occult infection given his recent admission.  Anticipate disposition based on neurology recognitions.  CT imaging did not show acute stroke and MRI was also obtained showing possible microvascular hemorrhages but no acute stroke at this time.  Patient continued to have no symptoms.  Neurology saw the patient and recommended patient was safe for discharge home.  They recommended him following up with his PCP and prior neurologist.  Patient and family agreed with plan of care and patient was discharged in good condition with no further symptoms.   Final Clinical Impressions(s) / ED Diagnoses   Final diagnoses:  Transient vision disturbance  Transient speech disturbance    ED Discharge Orders    None      Clinical Impression: 1. Transient vision disturbance   2. Transient speech disturbance     Disposition: Discharge  Condition: Good  I have discussed the results, Dx and Tx plan with the pt(& family if present). He/she/they expressed understanding and agree(s) with the plan. Discharge instructions discussed at great length. Strict return precautions discussed and pt &/or family have verbalized understanding of the instructions. No further questions at time of discharge.    Discharge Medication List as of 10/21/2017 10:32 PM      Follow Up: Unk Pinto, MD [address] [place] [address] [phone]     [facility] [address] [id number] mc [place]       Jacorey Donaway, Gwenyth Allegra, MD 10/22/17 [phone]

## 2017-10-21 NOTE — ED Notes (Signed)
Patient transported to X-ray 

## 2017-10-21 NOTE — ED Notes (Signed)
Pt returned from xray, attempting to provide urine sample at this time

## 2017-10-21 NOTE — ED Notes (Signed)
No complaints

## 2017-10-21 NOTE — Consult Note (Addendum)
NEURO HOSPITALIST CONSULT NOTE   Requestig physician: Dr. Sherry Ruffing  Reason for Consult: Visual obscuration x 2 hours, dysphasia x 5 minutes  History obtained from: Patient, Daughter and Chart     HPI:                                                                                                                                          Ryan Humphrey is an 84 y.o. male presenting to the ED for evaluation of bilateral visual distortion. The symptoms began this morning at 8:30 AM. He was normal on awakening. The visual symptoms consisted of diffuse, "jagged" distortion of everything he was seeing in both eyes, with all of the visual fields of both eyes equally involved. The obscuration was gradual in onset and gradually subsided, lasting for a total of 2 hours. The obscuration was not shimmering/moving - it was static. He and his daughter decided to go to the ED. On the way, he experienced 5 minutes of word finding difficulty, which then completely subsided. He has a remote history of migraine accompaniment (visual aura without headache pain). He has no prior history of stroke.   Past Medical History:  Diagnosis Date  . Anemia   . Arthritis    arthritis,osteopenia,"spinal stenosis"  . CAD (coronary artery disease)   . Cancer [facility]) 12-06-12   Prostate cancer'98  . Depression   . GERD (gastroesophageal reflux disease)   . H/O hiatal hernia   . H/O vertigo 12-06-12   none recent  . Hyperlipidemia   . IBS (irritable bowel syndrome)   . Neuropathy   . Osteopenia   . Peripheral neuropathy 12-06-12   peripheral neuropathy  . Rhinitis   . RLS (restless legs syndrome)   . Vitamin B12 deficiency     Past Surgical History:  Procedure Laterality Date  . APPENDECTOMY    . CARPAL TUNNEL RELEASE     both hands  . cataract surgery Left 12-06-12   recent surgery  . CHOLECYSTECTOMY    . COLONOSCOPY WITH PROPOFOL N/A 12/25/2012   Procedure: COLONOSCOPY WITH PROPOFOL;  Surgeon:  Garlan Fair, MD;  Location: [facility];  Service: Endoscopy;  Laterality: N/A;  . ESOPHAGOGASTRODUODENOSCOPY (EGD) WITH PROPOFOL N/A 12/25/2012   Procedure: ESOPHAGOGASTRODUODENOSCOPY (EGD) WITH PROPOFOL;  Surgeon: Garlan Fair, MD;  Location: [facility];  Service: Endoscopy;  Laterality: N/A;  . PROSTATE SURGERY  12-06-12  . TONSILLECTOMY    . TOTAL KNEE ARTHROPLASTY Right 11/25/2014   Procedure: RIGHT TOTAL KNEE ARTHROPLASTY;  Surgeon: Melrose Nakayama, MD;  Location: [facility];  Service: Orthopedics;  Laterality: Right;    Family History  Problem Relation Age of Onset  . CAD Father        Deceased, 61  . Dementia Mother  Deceased, 30  . Diabetes Brother   . Hyperlipidemia Brother   . Hypertension Brother   . Dementia Brother   . Colon cancer Neg Hx   . Colon polyps Neg Hx   . Kidney disease Neg Hx   . Esophageal cancer Neg Hx   . Gallbladder disease Neg Hx              Social History:  reports that he quit smoking about 54 years ago. He has a 15.00 pack-year smoking history. He has never used smokeless tobacco. He reports that he drinks alcohol. He reports that he does not use drugs.  Allergies  Allergen Reactions  . Niacin Itching  . Penicillins Other (See Comments)    "rash"  . Tramadol Itching    HOME MEDICATIONS:                                                                                                                        ROS:                                                                                                                                       No fevers, chills, SOB, nausea, vomiting, headache or presyncope. Other ROS as per HPI.   Blood pressure (!) 169/83, pulse 72, temperature 98 F (36.7 C), temperature source Oral, resp. rate 18, SpO2 100 %.   General Examination:                                                                                                       Physical Exam  HEENT-  [place]/AT    Lungs- Respirations  unlabored  Extremities- No edema  Neurological Examination Mental Status: Alert, oriented, thought content appropriate.  Speech fluent without evidence of aphasia.  Able to follow all commands without difficulty. Cranial Nerves: II: Visual fields intact with testing of each quadrant of each eye individually. No extinction with DSS. Visual acuity with corrective lenses: OD 20/25,  OS 20/40  III,IV, VI: No ptosis. EOMI without nystagmus.   V,VII: Smile symmetric, facial temp sensation equal bilaterally VIII: HOH IX,X: No hypophonia or hoarseness XI: Symmetric shoulder shrug XII: Midline tongue Motor: Right : Upper extremity   5/5    Left:     Upper extremity   5/5  Lower extremity   5/5     Lower extremity   5/5 Tone and bulk:normal tone throughout; no atrophy noted Sensory: Temp and light touch intact x 4 without extinction. Severe proprioceptive loss left great toe, mild proprioceptive loss right great toe.  Deep Tendon Reflexes: 2+ biceps and brachioradialis bilaterally, 4+ patellars, 2+ achilles bilaterally  Plantars: Right: downgoing  Left: downgoing Cerebellar: No ataxia with FNF bilaterally Gait: Deferred   Lab Results: Basic Metabolic Panel: Recent Labs  Lab 10/21/17 1157  NA 143  K 3.8  CL 107  CO2 29  GLUCOSE 97  BUN 14  CREATININE 1.46*  CALCIUM 8.9    CBC: Recent Labs  Lab 10/21/17 1157  WBC 7.3  NEUTROABS 5.9  HGB 12.7*  HCT 38.1*  MCV 94.5  PLT 130*    Cardiac Enzymes: No results for input(s): CKTOTAL, CKMB, CKMBINDEX, TROPONINI in the last 168 hours.  Lipid Panel: No results for input(s): CHOL, TRIG, HDL, CHOLHDL, VLDL, LDLCALC in the last 168 hours.  Imaging: Dg Chest 2 View  Result Date: 10/21/2017 CLINICAL DATA:  TIA like symptoms. Visual disturbance. Recent admission for flu. EXAM: CHEST - 2 VIEW COMPARISON:  08/16/2017 and prior radiographs FINDINGS: UPPER limits normal heart size noted. Mild chronic peribronchial thickening again noted.  There is no evidence of focal airspace disease, pulmonary edema, suspicious pulmonary nodule/mass, pleural effusion, or pneumothorax. No acute bony abnormalities are identified. IMPRESSION: No evidence of acute intracranial abnormality. Electronically Signed   By: Margarette Canada M.D.   On: 10/21/2017 15:55   Ct Head Wo Contrast  Result Date: 10/21/2017 CLINICAL DATA:  Pt endorses visual field change this morning beginning around 0900. EXAM: CT HEAD WITHOUT CONTRAST TECHNIQUE: Contiguous axial images were obtained from the base of the skull through the vertex without intravenous contrast. COMPARISON:  CT 01/03/2015, MRI 11/04/2016 FINDINGS: Brain: There is central and cortical atrophy. Periventricular white matter changes are consistent with small vessel disease. There is no intra or extra-axial fluid collection or mass lesion. The basilar cisterns and ventricles have a normal appearance. There is no CT evidence for acute infarction or hemorrhage. Vascular: There is atherosclerotic calcifications of the carotic siphons. Skull: Normal. Negative for fracture or focal lesion. Sinuses/Orbits: There is mild mucoperiosteal thickening of the ethmoid air cells. Small RIGHT mastoid effusion. Orbits are intact. Other: None IMPRESSION: 1. Atrophy and small vessel disease. 2.  No evidence for acute intracranial abnormality. 3. Small RIGHT mastoid effusion. 4. Minimal sinus disease. Electronically Signed   By: Nolon Nations M.D.   On: 10/21/2017 13:08    Assessment: 84 year old male presenting with transient binocular visual distortion 1. Neurological examination is nonfocal. Visual fields intact. Visual acuity unremarkable OU.  2. DDx for presentation would be atypical for stroke, as both occipital lobes would have to be involved simultaneously 3. Although he is of advanced age, most likely etiology is felt to be a migraine accompaniment (aura and neurological deficit without headache pain). Of note, he has a remote  history of migraine aura without headache.  4. CT head without acute abnormality. Atrophy and chronic small vessel ischemic changes are noted.  5. Hyperactive patellar reflexes  and proprioceptive deficits to great toes. Has a history of B12 deficiency. Also with cervical spinal stenosis on MRI neck from 2018. DDx includes B12 myelopathy, chronic cord compression or a combination. Of note, the patient is asymptomatic and states that he walks normally at home. This can be followed up with periodic gait assessments as an outpatient.   Recommendations: 1. MRI brain, MRA head 2. Start ASA 81 mg po qd 3. Redraw B12 level as outpatient.   Electronically signed: Dr. Kerney Elbe 10/21/2017, 5:03 PM

## 2017-10-22 NOTE — Progress Notes (Signed)
Addenddum to Dr Yvetta Coder note   MRI brain shows no acute stroke, however does show chronic microhemorrhages in bilateral PCA as well as a focus of hemorrhage in the left upper frontal lobe. MRA shows right P2 stenosis.   I spoke with the patient and daughter again, and he describes his vision as "jagged" or kaleidoscope vision,  rather than loss of vision. This lasted for about one and half hours.  The word finding difficulty occur for short period, not associated with slurred speech or facial droop. He did make sense while he is speaking according to the daughter.   He has similar visual symptoms in 2018 and after obtaining MRI brain, EEG it was thought symptoms were from complicated migraine versus TIA.  MRI Brain performed then did not show microhemorrhages.    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

## 2017-10-23 DIAGNOSIS — M1712 Unilateral primary osteoarthritis, left knee: Secondary | ICD-10-CM | POA: Diagnosis not present

## 2017-10-23 DIAGNOSIS — Z96651 Presence of right artificial knee joint: Secondary | ICD-10-CM | POA: Diagnosis not present

## 2017-10-23 DIAGNOSIS — M6281 Muscle weakness (generalized): Secondary | ICD-10-CM | POA: Diagnosis not present

## 2017-10-23 DIAGNOSIS — Z9181 History of falling: Secondary | ICD-10-CM | POA: Diagnosis not present

## 2017-11-07 ENCOUNTER — Telehealth: Payer: Self-pay | Admitting: Neurology

## 2017-11-07 NOTE — Telephone Encounter (Signed)
Patient's daughter called and she needs to please speak with you regarding Karam having a lot of confusion. Please Call. Thanks

## 2017-11-08 NOTE — Telephone Encounter (Signed)
Called patient's daughter and left message confirming the appointment for tomorrow.

## 2017-11-09 ENCOUNTER — Encounter: Payer: Self-pay | Admitting: Neurology

## 2017-11-09 ENCOUNTER — Ambulatory Visit (INDEPENDENT_AMBULATORY_CARE_PROVIDER_SITE_OTHER): Payer: Medicare Other | Admitting: Neurology

## 2017-11-09 VITALS — BP 128/68 | HR 94 | Ht 70.25 in | Wt 164.1 lb

## 2017-11-09 DIAGNOSIS — F0390 Unspecified dementia without behavioral disturbance: Secondary | ICD-10-CM

## 2017-11-09 DIAGNOSIS — G250 Essential tremor: Secondary | ICD-10-CM

## 2017-11-09 DIAGNOSIS — G609 Hereditary and idiopathic neuropathy, unspecified: Secondary | ICD-10-CM | POA: Diagnosis not present

## 2017-11-09 NOTE — Patient Instructions (Addendum)
1. Please set up POA and use your pillbox  2. We will send a referral for home nursing assistance for medication management and social worker  3. You have been referred for a neurocognitive evaluation in our office.   The evaluation takes approximately two hours. The first part of the appointment is a clinical interview with the neuropsychologist (Dr. Macarthur Critchley). Please bring someone with you to this appointment if possible, as it is helpful for Dr. Si Raider to hear from both you and another adult who knows you well. After speaking with Dr. Si Raider, you will complete testing with her technician. The testing includes a variety of tasks- mostly question-and-answer, some paper-and-pencil. There is nothing you need to do to prepare for this appointment, but having a good night's sleep prior to the testing, and bringing eyeglasses and hearing aids (if you wear them), is advised.   About a week after the evaluation, you will return to follow up with Dr. Si Raider to review the test results. This appointment is about 30 minutes. If you would like a family member to receive this information as well, please bring them to the appointment.   We have to reserve several hours of the neuropsychologist's time and the psychometrician's time for your evaluation appointment. As such, please note that there is a No-Show fee of $100. If you are unable to attend any of your appointments, please contact our office as soon as possible to reschedule.  4. If you are interested in the driving assessment, you can contact [employer] in [place]. [phone].  5. Return to clinic in 6 months

## 2017-11-09 NOTE — Progress Notes (Signed)
Follow-up Visit   Date: 11/09/17  Ryan Humphrey MRN: [id number] DOB: 06-27-1928   Interim History: Ryan Humphrey is a 84 y.o. right-handed Caucasian male with hyperlipidemia, arthritis, GERD, depression, prostate cancer, vitamin B12 deficiency, lumbar spine stenosis, and coronary artery disease returning to the clinic for follow-up of memory changes.  History of present illness: He has noticed increased problems with balance and finds himself using the furniture for assistance, especially at night time. He also feels as if his legs are weak. He has mild tingling of the toes at night. He has not had any falls, but does endorse stumbling at times. He walks independently.In 2015, he completed therapy and report no significant change, but was taught how to use the cane better.   He reports to having short-term memory problems and has to try harder to recall things, especially peoples names.  He also reports to have illegible hand writing and a tremor when he is trying to do fine motor tasks.  He is still living alone and taking care of his finances as well as driving without any difficulty.  His tremors have improved since start propranolol 20mg  daily.   UPDATE 08/19/2016:  Here is here for one year appointment.  He has noticed that he is more forgetful with short-term memory, but nothing that has been a drastic change.  He still keeps up with all his daily activities.  He drives locally, during daytime hours only.  His daughter has not mentioned any issues with driving safety. He has a housekeeper that comes once per month.  He manages his medications and finances.  His appetite is poor and often eats only when he is hungry.   Mood is good. No home safety concerns.     UPDATE 11/09/2017:   He is here for follow-up visit with his daughter, Ryan Humphrey. There has been progressive changes with memory and patient also endorses being more forgetful.  Daughter states that he often repeats himself and  asks the same questions.  He continues to live by himself and drives locally, but daughter feels that he may not be as safe.  He wants to continue to drive.  He has been more forgetful with his finances and medications.  His daughter now sets up his medications, but he still is not able to remember taking it. Mood is good. There is no behavior changes.  His niece is living with him temporarily and helps with household chores such as cooking and cleaning.   He continues to play his piano 2-3 hours/d, keeps up with current affairs, and engages socially.  Medications:  Current Outpatient Medications on File Prior to Visit  Medication Sig Dispense Refill  . Cholecalciferol (VITAMIN D) 2000 UNITS tablet Take 8,000 Units by mouth daily.    Marland Kitchen gabapentin (NEURONTIN) 400 MG capsule Take 400 mg by mouth 3 (three) times daily.    Marland Kitchen omeprazole (PRILOSEC) 40 MG capsule Take 1 capsule (40 mg total) by mouth every morning. Before breakfast 30 capsule 3  . propranolol (INDERAL) 20 MG tablet Take 1 tablet (20 mg total) by mouth 2 (two) times daily. 180 tablet 3  . sertraline (ZOLOFT) 100 MG tablet 1 pill daily for mood (Patient taking differently: Take 100 mg by mouth daily. ) 90 tablet 1  . simvastatin (ZOCOR) 20 MG tablet Take 1 tablet (20 mg total) by mouth every evening. 90 tablet 3  . SLO-NIACIN PO Take by mouth.     No current facility-administered medications  on file prior to visit.     Allergies:  Allergies  Allergen Reactions  . Niacin Itching  . Penicillins Other (See Comments)    "rash"  . Tramadol Itching     Review of Systems:  CONSTITUTIONAL: No fevers, chills, night sweats, or weight loss.   EYES: No visual changes or eye pain ENT: No hearing changes.  No history of nose bleeds.   RESPIRATORY: No cough, wheezing and shortness of breath.   CARDIOVASCULAR: Negative for chest pain, and palpitations.   GI: Negative for abdominal discomfort, blood in stools or black stools.  No recent change  in bowel habits.   GU:  No history of incontinence.   MUSCLOSKELETAL: +history of joint pain or swelling.  No myalgias.   SKIN: Negative for lesions, rash, and itching.   ENDOCRINE: Negative for cold or heat intolerance, polydipsia or goiter.   PSYCH:  No depression or anxiety symptoms.   NEURO: As Above.   Vital Signs:  BP 128/68   Pulse 94   Ht 5' 10.25" (1.784 m)   Wt 164 lb 2 oz (74.4 kg)   SpO2 97%   BMI 23.38 kg/m   Neurological Exam: MENTAL STATUS including orientation to time, place, person, recent and remote memory, attention span and concentration, language, and fund of knowledge is relatively intact.  Speech is not dysarthric.  Montreal Cognitive Assessment  11/09/2017 08/19/2016 08/20/2015 04/29/2014  Visuospatial/ Executive (0/5) 4 5 4 5   Naming (0/3) 3 2 2 2   Attention: Read list of digits (0/2) 2 2 2 2   Attention: Read list of letters (0/1) 1 1 1 1   Attention: Serial 7 subtraction starting at 100 (0/3) 3 3 3 3   Language: Repeat phrase (0/2) 2 2 2 2   Language : Fluency (0/1) 1 1 1 1   Abstraction (0/2) 2 2 2 2   Delayed Recall (0/5) 0 0 0 3  Orientation (0/6) 5 6 6 6   Total 23 24 23 27   Adjusted Score (based on education) 23 24 - 27   CRANIAL NERVES:  Pupils equal round and reactive to light.  Normal conjugate, extra-ocular eye movements in all directions of gaze.  No ptosis.  Face is symmetric.   MOTOR:  Motor strength is 5/5 in all extremities. No postural tremor.  COORDINATION/GAIT:     Gait is assisted with cane and appears stable, unsteady with turning at times.  Data: Lab Results  Component Value Date   TSH 2.14 08/25/2017   Lab Results  Component Value Date   HGBA1C 5.1 05/23/2017   Lab Results  Component Value Date   VITAMINB12 305 05/23/2017   04/13/07 MRI cervical spine - congenital fusion at C6-7 with residual uncovertebral disease on the left causing mild to moderate left foraminal stenosis, advanced spondylosis at C3-C4 through C5-C6 with mild  foraminal narrowing at each of those levels worse on the right with a disc contacting and deforming the cord at each of those levels.   10/28/10 MRI lumbar spine - mild to moderate spinal stenosis at L1-2, moderate to marked left-sided and moderate right-sided spinal stenosis at L2-3, moderate to marked spinal stenosis and bilateral lateral recess stenosis at L3-4, mild spinal stenosis greater on the right and mild left foraminal narrowing at L4-5, broad-based disc osteophyte ridge and left mild indentation of the thecal sac at L5-S1 with mild extension to the left but no compression of the L5 nerve root.  10/21/2017 MRI brian wo contrast - 1. No acute infarct or acute  intracranial abnormality. However, several small areas of chronic hemorrhage in the anterior left MCA and bilateral PCA vascular territories are new since the 2018 MRI. These appear nonspecific, and without encephalomalacia or other corresponding discrete parenchymal signal changes.  2. Otherwise stable noncontrast MRI appearance of the brain with fairly mild for age nonspecific signal changes in the cerebral white matter and pons.  3. Intracranial MRA demonstrates only mild for age intracranial atherosclerosis; there is up to moderate stenosis of the right PCA P2 segment, but minimal to mild intracranial stenoses otherwise.  IMPRESSION/PLAN: 1.  Dementia without behavior changes.   Patient is starting to have greater difficulty with IADLs which is interfering with his independent functioning, which is greater than expected for MCI.  Over the past few years, his [place] has dropped from 2016 (27 > 24 >23).  He is agreeable to undergo formal psychological testing.  I am concerned about his ability to safely drive and will send him for driving evaluation.  I have asked him to set up POA so his daughter can oversee his finances.  Home therapy referral for nursing assistance for medication management and social worker to assist with future  placement needs.  He has not tolerated cholinesterase inhibitor therapy in the past and does not want to try any alternative medications.  2.  Benign essential tremors, stable.  Continue propranolol 20mg  BID  3.  Multifactorial gait instability from idiopathic peripheral neuropathy and known cervical and lumbar stenosis with myelopathy.  Continue to use cane, gait training completed  4.  Vision changes, resolved.  If he continues to have visual disturbance will need to consider occipital seizure and start AED.  Return to clinic in 6 months  Greater than 50% of this 40 minute visit was spent in counseling, explanation of diagnosis, planning of further management, and coordination of care.   Thank you for allowing me to participate in patient's care.  If I can answer any additional questions, I would be pleased to do so.    Sincerely,    Pedram Goodchild K. Posey Pronto, DO

## 2017-11-10 ENCOUNTER — Other Ambulatory Visit: Payer: Self-pay | Admitting: *Deleted

## 2017-11-10 DIAGNOSIS — G3184 Mild cognitive impairment, so stated: Secondary | ICD-10-CM

## 2017-11-10 DIAGNOSIS — F0390 Unspecified dementia without behavioral disturbance: Secondary | ICD-10-CM

## 2017-11-30 ENCOUNTER — Encounter: Payer: Self-pay | Admitting: Internal Medicine

## 2017-11-30 NOTE — Progress Notes (Signed)
This very nice 84 y.o. WWM presents for 6 month follow up with HTN, HLD, Pre-Diabetes,. Depression and Vitamin D Deficiency. Patient has hx/o treated Ca Prostate, and is also followed by Dr Posey Pronto for vascular Dementia, Lumbar Sinal Stenosis,  peripheral sensory Neuropathy and AS Cerebrovascular Disease and Familial Tremor. He also has hx/o B12 Deficiency and B12 level was low at 305 in Dec 2018 and he was advised to supplement. He has GERD controlled on his meds.     Patient had an ER visit with visual distortions felt Migraine Variant and subsequent Brain MRI & MRA showed chronic changes.      Patient is treated for HTN (circa 1990's) & BP has been controlled at home. Today's BP is at goal - 140/66.  Negative Stress Myoview in 2016.  Patient has had no complaints of any cardiac type chest pain, palpitations, dyspnea / orthopnea / PND, dizziness, claudication, or dependent edema.     Hyperlipidemia is controlled with diet & meds. Patient denies myalgias or other med SE's. Last Lipids were near goal: Lab Results  Component Value Date   CHOL 168 12/01/2017   HDL 38 (L) 12/01/2017   LDLCALC 107 (H) 12/01/2017   TRIG 120 12/01/2017   CHOLHDL 4.4 12/01/2017      Also, the patient is followed expectantly for PreDiabetes and has had no symptoms of reactive hypoglycemia, diabetic polys, paresthesias or visual blurring.  Last A1c was Normal & at goal:  Lab Results  Component Value Date   HGBA1C 5.0 12/01/2017      Further, the patient also has history of Vitamin D Deficiency ("42" in 2016) and supplements vitamin D without any suspected side-effects. Also, he has hx/o Osteoporosis on Fosamax x 5years (2013-2018). In 2016, he had a total knee replacement.  Last vitamin D was still low (goal 70-100):  Lab Results  Component Value Date   VD25OH 47 12/01/2017   Current Outpatient Medications on File Prior to Visit  Medication Sig  . Cholecalciferol (VITAMIN D) 2000 UNITS tablet Take 8,000 Units  by mouth daily.  Marland Kitchen gabapentin (NEURONTIN) 400 MG capsule Take 400 mg by mouth 3 (three) times daily.  Marland Kitchen omeprazole (PRILOSEC) 40 MG capsule Take 1 capsule (40 mg total) by mouth every morning. Before breakfast  . propranolol (INDERAL) 20 MG tablet Take 1 tablet (20 mg total) by mouth 2 (two) times daily.  . sertraline (ZOLOFT) 100 MG tablet 1 pill daily for mood (Patient taking differently: Take 100 mg by mouth daily. )  . simvastatin (ZOCOR) 20 MG tablet Take 1 tablet (20 mg total) by mouth every evening.   No current facility-administered medications on file prior to visit.    Allergies  Allergen Reactions  . Niacin Itching  . Penicillins Other (See Comments)    "rash"  . Tramadol Itching   PMHx:   Past Medical History:  Diagnosis Date  . Anemia   . Arthritis    arthritis,osteopenia,"spinal stenosis"  . CAD (coronary artery disease)   . Cancer [facility]) 12-06-12   Prostate cancer'98  . Depression   . GERD (gastroesophageal reflux disease)   . H/O hiatal hernia   . H/O vertigo 12-06-12   none recent  . Hyperlipidemia   . IBS (irritable bowel syndrome)   . Neuropathy   . Osteopenia   . Peripheral neuropathy 12-06-12   peripheral neuropathy  . Rhinitis   . RLS (restless legs syndrome)   . Vitamin B12 deficiency  Immunization History  Administered Date(s) Administered  . Influenza, High Dose Seasonal PF 03/05/2015  . Influenza-Unspecified 04/22/2016, 03/13/2017  . Pneumococcal Conjugate-13 05/23/2017  . Pneumococcal Polysaccharide-23 01/20/2016   Past Surgical History:  Procedure Laterality Date  . APPENDECTOMY    . CARPAL TUNNEL RELEASE     both hands  . cataract surgery Left 12-06-12   recent surgery  . CHOLECYSTECTOMY    . COLONOSCOPY WITH PROPOFOL N/A 12/25/2012   Procedure: COLONOSCOPY WITH PROPOFOL;  Surgeon: Garlan Fair, MD;  Location: [facility];  Service: Endoscopy;  Laterality: N/A;  . ESOPHAGOGASTRODUODENOSCOPY (EGD) WITH PROPOFOL N/A 12/25/2012    Procedure: ESOPHAGOGASTRODUODENOSCOPY (EGD) WITH PROPOFOL;  Surgeon: Garlan Fair, MD;  Location: [facility];  Service: Endoscopy;  Laterality: N/A;  . PROSTATE SURGERY  12-06-12  . TONSILLECTOMY    . TOTAL KNEE ARTHROPLASTY Right 11/25/2014   Procedure: RIGHT TOTAL KNEE ARTHROPLASTY;  Surgeon: Melrose Nakayama, MD;  Location: [place];  Service: Orthopedics;  Laterality: Right;   FHx:    Reviewed / unchanged  SHx:    Reviewed / unchanged    Systems Review:  Constitutional: Denies fever, chills, wt changes, headaches, insomnia, fatigue, night sweats, change in appetite. Eyes: Denies redness, blurred vision, diplopia, discharge, itchy, watery eyes.  ENT: Denies discharge, congestion, post nasal drip, epistaxis, sore throat, earache, hearing loss, dental pain, tinnitus, vertigo, sinus pain, snoring.  CV: Denies chest pain, palpitations, irregular heartbeat, syncope, dyspnea, diaphoresis, orthopnea, PND, claudication or edema. Respiratory: denies cough, dyspnea, DOE, pleurisy, hoarseness, laryngitis, wheezing.  Gastrointestinal: Denies dysphagia, odynophagia, heartburn, reflux, water brash, abdominal pain or cramps, nausea, vomiting, bloating, diarrhea, constipation, hematemesis, melena, hematochezia  or hemorrhoids. Genitourinary: Denies dysuria, frequency, urgency, nocturia, hesitancy, discharge, hematuria or flank pain. Musculoskeletal: Denies arthralgias, myalgias, stiffness, jt. swelling, pain, limping or strain/sprain.  Skin: Denies pruritus, rash, hives, warts, acne, eczema or change in skin lesion(s). Neuro: No weakness, tremor, incoordination, spasms, paresthesia or pain. Psychiatric: Denies confusion, memory loss or sensory loss. Endo: Denies change in weight, skin or hair change.  Heme/Lymph: No excessive bleeding, bruising or enlarged lymph nodes.  Physical Exam  BP 140/66   Pulse 88   Temp 97.7 F (36.5 C)   Resp 18   Ht 5' 10.25" (1.784 m)   Wt 165 lb 3.2 oz (74.9 kg)    BMI 23.54 kg/m   Appears  well nourished, well groomed  and in no distress.  Eyes: PERRLA, EOMs, conjunctiva no swelling or erythema. Sinuses: No frontal/maxillary tenderness ENT/Mouth: EAC's clear, TM's nl w/o erythema, bulging. Nares clear w/o erythema, swelling, exudates. Oropharynx clear without erythema or exudates. Oral hygiene is good. Tongue normal, non obstructing. Hearing intact.  Neck: Supple. Thyroid not palpable. Car 2+/2+ without bruits, nodes or JVD. Chest: Respirations nl with BS clear & equal w/o rales, rhonchi, wheezing or stridor.  Cor: Heart sounds normal w/ regular rate and rhythm without sig. murmurs, gallops, clicks or rubs. Peripheral pulses normal and equal  without edema.  Abdomen: Soft & bowel sounds normal. Non-tender w/o guarding, rebound, hernias, masses or organomegaly.  Lymphatics: Unremarkable.  Musculoskeletal: Full ROM all peripheral extremities, joint stability, 5/5 strength and normal gait.  Skin: Warm, dry without exposed rashes, lesions or ecchymosis apparent.  Neuro: Cranial nerves intact, reflexes equal bilaterally. Sensory-motor testing grossly intact. Tendon reflexes grossly intact.  Pysch: Alert & oriented x 3.  Insight and judgement nl & appropriate. No ideations.  Assessment and Plan:  1. Essential hypertension, benign  - Continue medication, monitor  blood pressure at home.  - Continue DASH diet.  Reminder to go to the ER if any CP,  SOB, nausea, dizziness, severe HA, changes vision/speech.  - CBC with Differential/Platelet - COMPLETE METABOLIC PANEL WITH GFR - Magnesium - TSH  2. Hyperlipidemia, mixed  - Continue diet/meds, exercise,& lifestyle modifications.  - Continue monitor periodic cholesterol/liver & renal functions   - Lipid panel - TSH  3. Abnormal glucose  - Continue diet, exercise, lifestyle modifications.  - Monitor appropriate labs.  - Hemoglobin A1c - Insulin, random  4. Vitamin D deficiency  - Continue  supplementation.   - VITAMIN D 25 Hydroxyl  5. Vitamin B12 deficiency  - Vitamin B12 - Methylmalonic acid, serum  6. Prediabetes  - Hemoglobin A1c - Insulin, random  7. CKD (chronic kidney disease) stage 3, GFR 30-59 ml/min (HCC)  - COMPLETE METABOLIC PANEL WITH GFR  8. Medication management  - CBC with Differential/Platelet - COMPLETE METABOLIC PANEL WITH GFR - Magnesium - Lipid panel - TSH - Hemoglobin A1c - Insulin, random - VITAMIN D 25 Hydroxyl - Vitamin B12 - Methylmalonic acid, serum          Discussed  regular exercise, BP monitoring, weight control to achieve/maintain BMI less than 25 and discussed med and SE's. Recommended labs to assess and monitor clinical status with further disposition pending results of labs. Over 30 minutes of exam, counseling, chart review was performed.

## 2017-11-30 NOTE — Patient Instructions (Signed)

## 2017-12-01 ENCOUNTER — Ambulatory Visit (INDEPENDENT_AMBULATORY_CARE_PROVIDER_SITE_OTHER): Payer: Medicare Other | Admitting: Internal Medicine

## 2017-12-01 VITALS — BP 140/66 | HR 88 | Temp 97.7°F | Resp 18 | Ht 70.25 in | Wt 165.2 lb

## 2017-12-01 DIAGNOSIS — Z79899 Other long term (current) drug therapy: Secondary | ICD-10-CM | POA: Diagnosis not present

## 2017-12-01 DIAGNOSIS — N183 Chronic kidney disease, stage 3 unspecified: Secondary | ICD-10-CM

## 2017-12-01 DIAGNOSIS — I10 Essential (primary) hypertension: Secondary | ICD-10-CM

## 2017-12-01 DIAGNOSIS — R7309 Other abnormal glucose: Secondary | ICD-10-CM | POA: Diagnosis not present

## 2017-12-01 DIAGNOSIS — E559 Vitamin D deficiency, unspecified: Secondary | ICD-10-CM | POA: Diagnosis not present

## 2017-12-01 DIAGNOSIS — E538 Deficiency of other specified B group vitamins: Secondary | ICD-10-CM | POA: Diagnosis not present

## 2017-12-01 DIAGNOSIS — R7303 Prediabetes: Secondary | ICD-10-CM | POA: Diagnosis not present

## 2017-12-01 DIAGNOSIS — E782 Mixed hyperlipidemia: Secondary | ICD-10-CM | POA: Diagnosis not present

## 2017-12-04 LAB — COMPLETE METABOLIC PANEL WITH GFR
AG RATIO: 2.2 (calc) (ref 1.0–2.5)
ALT: 5 U/L — ABNORMAL LOW (ref 9–46)
AST: 11 U/L (ref 10–35)
Albumin: 4.3 g/dL (ref 3.6–5.1)
Alkaline phosphatase (APISO): 66 U/L (ref 40–115)
BILIRUBIN TOTAL: 0.7 mg/dL (ref 0.2–1.2)
BUN/Creatinine Ratio: 12 (calc) (ref 6–22)
BUN: 17 mg/dL (ref 7–25)
CHLORIDE: 107 mmol/L (ref 98–110)
CO2: 29 mmol/L (ref 20–32)
Calcium: 9.2 mg/dL (ref 8.6–10.3)
Creat: 1.41 mg/dL — ABNORMAL HIGH (ref 0.70–1.11)
GFR, EST AFRICAN AMERICAN: 52 mL/min/{1.73_m2} — AB (ref >=60)
GFR, EST NON AFRICAN AMERICAN: 45 mL/min/{1.73_m2} — AB (ref >=60)
GLOBULIN: 2 g/dL (ref 1.9–3.7)
Glucose, Bld: 134 mg/dL — ABNORMAL HIGH (ref 65–99)
POTASSIUM: 4 mmol/L (ref 3.5–5.3)
SODIUM: 144 mmol/L (ref 135–146)
TOTAL PROTEIN: 6.3 g/dL (ref 6.1–8.1)

## 2017-12-04 LAB — CBC WITH DIFFERENTIAL/PLATELET
Basophils Absolute: 17 cells/uL (ref 0–200)
Basophils Relative: 0.3 %
EOS ABS: 93 {cells}/uL (ref 15–500)
Eosinophils Relative: 1.6 %
HEMATOCRIT: 38.5 % (ref 38.5–50.0)
Hemoglobin: 13.1 g/dL — ABNORMAL LOW (ref 13.2–17.1)
LYMPHS ABS: 835 {cells}/uL — AB (ref 850–3900)
MCH: 31.6 pg (ref 27.0–33.0)
MCHC: 34 g/dL (ref 32.0–36.0)
MCV: 93 fL (ref 80.0–100.0)
MPV: 10.9 fL (ref 7.5–12.5)
Monocytes Relative: 6.2 %
NEUTROS PCT: 77.5 %
Neutro Abs: 4495 cells/uL (ref 1500–7800)
Platelets: 146 10*3/uL (ref 140–400)
RBC: 4.14 10*6/uL — ABNORMAL LOW (ref 4.20–5.80)
RDW: 12.5 % (ref 11.0–15.0)
TOTAL LYMPHOCYTE: 14.4 %
WBC mixed population: 360 cells/uL (ref 200–950)
WBC: 5.8 10*3/uL (ref 3.8–10.8)

## 2017-12-04 LAB — LIPID PANEL
CHOL/HDL RATIO: 4.4 (calc) (ref <5.0)
Cholesterol: 168 mg/dL (ref <200)
HDL: 38 mg/dL — AB (ref >40)
LDL Cholesterol (Calc): 107 mg/dL (calc) — ABNORMAL HIGH
NON-HDL CHOLESTEROL (CALC): 130 mg/dL — AB (ref <130)
Triglycerides: 120 mg/dL (ref <150)

## 2017-12-04 LAB — METHYLMALONIC ACID, SERUM: Methylmalonic Acid, Quant: 1080 nmol/L — ABNORMAL HIGH (ref 87–318)

## 2017-12-04 LAB — VITAMIN D 25 HYDROXY (VIT D DEFICIENCY, FRACTURES): VIT D 25 HYDROXY: 47 ng/mL (ref 30–100)

## 2017-12-04 LAB — HEMOGLOBIN A1C
EAG (MMOL/L): 5.4 (calc)
HEMOGLOBIN A1C: 5 %{Hb} (ref <5.7)
Mean Plasma Glucose: 97 (calc)

## 2017-12-04 LAB — INSULIN, RANDOM: Insulin: 61 u[IU]/mL — ABNORMAL HIGH (ref 2.0–19.6)

## 2017-12-04 LAB — TSH: TSH: 2.44 m[IU]/L (ref 0.40–4.50)

## 2017-12-04 LAB — MAGNESIUM: Magnesium: 2.1 mg/dL (ref 1.5–2.5)

## 2017-12-04 LAB — VITAMIN B12: VITAMIN B 12: 243 pg/mL (ref 200–1100)

## 2018-01-18 ENCOUNTER — Emergency Department (HOSPITAL_COMMUNITY)
Admission: EM | Admit: 2018-01-18 | Discharge: 2018-01-18 | Disposition: A | Discharge status: Home / Self Care | Payer: Medicare Other | Attending: Emergency Medicine | Admitting: Emergency Medicine

## 2018-01-18 ENCOUNTER — Encounter (HOSPITAL_COMMUNITY): Payer: Self-pay | Admitting: Emergency Medicine

## 2018-01-18 ENCOUNTER — Emergency Department (HOSPITAL_COMMUNITY): Payer: Medicare Other

## 2018-01-18 DIAGNOSIS — I129 Hypertensive chronic kidney disease with stage 1 through stage 4 chronic kidney disease, or unspecified chronic kidney disease: Secondary | ICD-10-CM | POA: Diagnosis not present

## 2018-01-18 DIAGNOSIS — I2510 Atherosclerotic heart disease of native coronary artery without angina pectoris: Secondary | ICD-10-CM | POA: Insufficient documentation

## 2018-01-18 DIAGNOSIS — Z87891 Personal history of nicotine dependence: Secondary | ICD-10-CM | POA: Diagnosis not present

## 2018-01-18 DIAGNOSIS — Z96651 Presence of right artificial knee joint: Secondary | ICD-10-CM | POA: Insufficient documentation

## 2018-01-18 DIAGNOSIS — Z8673 Personal history of transient ischemic attack (TIA), and cerebral infarction without residual deficits: Secondary | ICD-10-CM | POA: Insufficient documentation

## 2018-01-18 DIAGNOSIS — R531 Weakness: Secondary | ICD-10-CM | POA: Diagnosis not present

## 2018-01-18 DIAGNOSIS — N183 Chronic kidney disease, stage 3 (moderate): Secondary | ICD-10-CM | POA: Insufficient documentation

## 2018-01-18 DIAGNOSIS — I10 Essential (primary) hypertension: Secondary | ICD-10-CM | POA: Diagnosis not present

## 2018-01-18 LAB — BASIC METABOLIC PANEL
Anion gap: 10 (ref 5–15)
BUN: 17 mg/dL (ref 8–23)
CO2: 24 mmol/L (ref 22–32)
Calcium: 9.1 mg/dL (ref 8.9–10.3)
Chloride: 109 mmol/L (ref 98–111)
Creatinine, Ser: 1.32 mg/dL — ABNORMAL HIGH (ref 0.61–1.24)
GFR calc Af Amer: 55 mL/min — ABNORMAL LOW (ref >60)
GFR calc non Af Amer: 47 mL/min — ABNORMAL LOW (ref >60)
Glucose, Bld: 97 mg/dL (ref 70–99)
Potassium: 4.1 mmol/L (ref 3.5–5.1)
Sodium: 143 mmol/L (ref 135–145)

## 2018-01-18 LAB — CBG MONITORING, ED: Glucose-Capillary: 85 mg/dL (ref 70–99)

## 2018-01-18 LAB — CBC
HCT: 39.6 % (ref 39.0–52.0)
Hemoglobin: 13 g/dL (ref 13.0–17.0)
MCH: 32.1 pg (ref 26.0–34.0)
MCHC: 32.8 g/dL (ref 30.0–36.0)
MCV: 97.8 fL (ref 78.0–100.0)
Platelets: 138 10*3/uL — ABNORMAL LOW (ref 150–400)
RBC: 4.05 MIL/uL — ABNORMAL LOW (ref 4.22–5.81)
RDW: 12.5 % (ref 11.5–15.5)
WBC: 5.9 10*3/uL (ref 4.0–10.5)

## 2018-01-18 LAB — URINALYSIS, ROUTINE W REFLEX MICROSCOPIC
Bilirubin Urine: NEGATIVE
Glucose, UA: NEGATIVE mg/dL
Hgb urine dipstick: NEGATIVE
Ketones, ur: NEGATIVE mg/dL
Leukocytes, UA: NEGATIVE
Nitrite: NEGATIVE
Protein, ur: NEGATIVE mg/dL
Specific Gravity, Urine: 1.018 (ref 1.005–1.030)
pH: 5 (ref 5.0–8.0)

## 2018-01-18 NOTE — ED Triage Notes (Signed)
Pt arrives via EMS from home with complaints of generalized weakness that started this morning. Pt reports losing around 40 lbs over the past year but has been eating normal. Pt A&Ox4, walks with cane, denies any recent falls.

## 2018-01-18 NOTE — ED Notes (Signed)
Patient transported to CT 

## 2018-01-18 NOTE — ED Notes (Signed)
Spoke to [employer] to confirm pt housing.

## 2018-01-18 NOTE — ED Provider Notes (Signed)
[facility] EMERGENCY DEPARTMENT Provider Note   CSN: [id number] Arrival date & time: 01/18/18  9371     History   Chief Complaint Chief Complaint  Patient presents with  . Weakness    HPI Ryan Humphrey is a 84 y.o. male.  HPI   84 year old male presenting for evaluation after he did not want to go to breakfast this morning and just felt like something was not quite right.  He states that he woke up and felt relatively fine.  He was able to get himself dressed per his normal routine.  He states that he just did not feel like going to breakfast though but is not quite sure why. When asked to speak more specifically about how felt he will tell me that his memory isn't what it used to be. He repeats himself at times.  He denies having any acute pain.  No respiratory complaints.  He denies feeling dizzy or lightheaded.  Uses a cane at baseline but did not feel more off balance than typical.  No nausea.  No acute urinary complaints.   Past Medical History:  Diagnosis Date  . Anemia   . Arthritis    arthritis,osteopenia,"spinal stenosis"  . CAD (coronary artery disease)   . Cancer [facility]) 12-06-12   Prostate cancer'98  . Depression   . GERD (gastroesophageal reflux disease)   . H/O hiatal hernia   . H/O vertigo 12-06-12   none recent  . Hyperlipidemia   . IBS (irritable bowel syndrome)   . Neuropathy   . Osteopenia   . Peripheral neuropathy 12-06-12   peripheral neuropathy  . Rhinitis   . RLS (restless legs syndrome)   . Vitamin B12 deficiency     Patient Active Problem List   Diagnosis Date Noted  . CKD (chronic kidney disease) stage 3, GFR 30-59 ml/min (HCC) 08/17/2017  . History of TIA (transient ischemic attack) 11/05/2016  . Vitamin B12 deficiency 11/05/2016  . GERD (gastroesophageal reflux disease) 11/05/2016  . MCI (mild cognitive impairment) 08/20/2015  . Hereditary and idiopathic peripheral neuropathy 08/20/2015  . Benign essential tremor  08/20/2015  . Vitamin D deficiency 01/06/2015  . Medication management 01/06/2015  . Primary osteoarthritis of right knee 11/25/2014  . Bladder neck obstruction 03/28/2011  . Malignant neoplasm of prostate ([place]) 03/28/2011  . INTERMITTENT VERTIGO 10/12/2009  . Mixed hyperlipidemia 04/10/2009  . Essential hypertension, benign 04/10/2009  . CORONARY ATHEROSCLEROSIS NATIVE CORONARY ARTERY 04/10/2009    Past Surgical History:  Procedure Laterality Date  . APPENDECTOMY    . CARPAL TUNNEL RELEASE     both hands  . cataract surgery Left 12-06-12   recent surgery  . CHOLECYSTECTOMY    . COLONOSCOPY WITH PROPOFOL N/A 12/25/2012   Procedure: COLONOSCOPY WITH PROPOFOL;  Surgeon: Garlan Fair, MD;  [facility];  Service: Endoscopy;  Laterality: N/A;  . ESOPHAGOGASTRODUODENOSCOPY (EGD) WITH PROPOFOL N/A 12/25/2012   Procedure: ESOPHAGOGASTRODUODENOSCOPY (EGD) WITH PROPOFOL;  Surgeon: Garlan Fair, MD;  [facility];  Service: Endoscopy;  Laterality: N/A;  . PROSTATE SURGERY  12-06-12  . TONSILLECTOMY    . TOTAL KNEE ARTHROPLASTY Right 11/25/2014   Procedure: RIGHT TOTAL KNEE ARTHROPLASTY;  Surgeon: Melrose Nakayama, MD;  Location: [place];  Service: Orthopedics;  Laterality: Right;        Home Medications    Prior to Admission medications   Medication Sig Start Date End Date Taking? Authorizing Provider  Cholecalciferol (VITAMIN D) 2000 UNITS tablet Take 8,000 Units  by mouth daily.    [provider]  gabapentin (NEURONTIN) 400 MG capsule Take 400 mg by mouth daily.     [provider]  omeprazole (PRILOSEC) 40 MG capsule Take 1 capsule (40 mg total) by mouth every morning. Before breakfast 04/05/17   Willia Craze, NP  propranolol (INDERAL) 20 MG tablet Take 1 tablet (20 mg total) by mouth 2 (two) times daily. 10/14/15   Unk Pinto, MD  sertraline (ZOLOFT) 100 MG tablet 1 pill daily for mood Patient taking differently: Take 100 mg by mouth  daily.  02/15/17 02/15/18  Vicie Mutters, PA-C  simvastatin (ZOCOR) 20 MG tablet Take 1 tablet (20 mg total) by mouth every evening. 10/14/15   Unk Pinto, MD    Family History Family History  Problem Relation Age of Onset  . CAD Father        Deceased, 41  . Dementia Mother        Deceased, 72  . Diabetes Brother   . Hyperlipidemia Brother   . Hypertension Brother   . Dementia Brother   . Colon cancer Neg Hx   . Colon polyps Neg Hx   . Kidney disease Neg Hx   . Esophageal cancer Neg Hx   . Gallbladder disease Neg Hx     Social History Social History   Tobacco Use  . Smoking status: Former Smoker    Packs/day: 0.50    Years: 30.00    Pack years: 15.00    Last attempt to quit: 06/14/1963    Years since quitting: 54.6  . Smokeless tobacco: Never Used  Substance Use Topics  . Alcohol use: Yes    Alcohol/week: 0.0 standard drinks    Comment: Socially  . Drug use: No     Allergies   Niacin; Penicillins; and Tramadol   Review of Systems Review of Systems  All systems reviewed and negative, other than as noted in HPI.  Physical Exam Updated Vital Signs BP 140/77   Pulse 83   Temp (!) 97.5 F (36.4 C) (Oral)   Ht 6\' 1"  (1.854 m)   SpO2 100%   BMI 21.80 kg/m   Physical Exam  Constitutional: He appears well-developed and well-nourished. No distress.  HENT:  Head: Normocephalic and atraumatic.  Eyes: Pupils are equal, round, and reactive to light. Conjunctivae and EOM are normal. Right eye exhibits no discharge. Left eye exhibits no discharge.  Neck: Neck supple.  Cardiovascular: Normal rate, regular rhythm and normal heart sounds. Exam reveals no gallop and no friction rub.  No murmur heard. Pulmonary/Chest: Effort normal and breath sounds normal. No respiratory distress.  Abdominal: Soft. He exhibits no distension. There is no tenderness.  Musculoskeletal: He exhibits no edema or tenderness.  Neurological: He is alert.  Speech is clear.  Content  appropriate.  Following commands.  Cranial nerves II through XII are intact.  Strength is 5 out of 5 bilateral upper and lower extremities.  Sensation is intact light touch.  Skin: Skin is warm and dry.  Psychiatric: He has a normal mood and affect. His behavior is normal. Thought content normal.  Nursing note and vitals reviewed.    ED Treatments / Results  Labs (all labs ordered are listed, but only abnormal results are displayed) Labs Reviewed  BASIC METABOLIC PANEL - Abnormal; Notable for the following components:      Result Value   Creatinine, Ser 1.32 (*)    GFR calc non Af Amer 47 (*)    GFR  calc Af Amer 55 (*)    All other components within normal limits  CBC - Abnormal; Notable for the following components:   RBC 4.05 (*)    Platelets 138 (*)    All other components within normal limits  URINALYSIS, ROUTINE W REFLEX MICROSCOPIC  CBG MONITORING, ED    EKG EKG Interpretation  Date/Time:  Thursday January 18 2018 08:44:07 EDT Ventricular Rate:  82 PR Interval:    QRS Duration: 149 QT Interval:  423 QTC Calculation: 495 R Axis:   -47 Text Interpretation:  Sinus rhythm RBBB and LAFB Baseline wander in lead(s) V6 Confirmed by Virgel Manifold [phone]) on 01/18/2018 9:16:40 AM   Radiology No results found.  Procedures Procedures (including critical care time)  Medications Ordered in ED Medications - No data to display   Initial Impression / Assessment and Plan / ED Course  I have reviewed the triage vital signs and the nursing notes.  Pertinent labs & imaging results that were available during my care of the patient were reviewed by me and considered in my medical decision making (see chart for details).     [phone] presenting with vague sense of not feeling right this morning. Says currently "I feel fine." He does seem to have some mild cognitive impairment. His answers are appropriate for context but has some difficulty with recall. Occasionally will repeat himself,  but his neuro exam is otherwise fine. He is afebrile. He generally looks well. Vitals reassuring. Will obtain some screening studies, but at this time I have a low suspicion for emergent process.   CBC unremarkable.  Electrolytes are fine.  CT head without acute abnormality.  Final Clinical Impressions(s) / ED Diagnoses   Final diagnoses:  Generalized weakness    ED Discharge Orders    None       Virgel Manifold, MD 01/21/18 1100

## 2018-01-29 ENCOUNTER — Encounter

## 2018-01-29 ENCOUNTER — Ambulatory Visit: Payer: BLUE CROSS/BLUE SHIELD | Admitting: Neurology

## 2018-02-22 ENCOUNTER — Encounter: Payer: Self-pay | Admitting: Psychology

## 2018-02-22 ENCOUNTER — Ambulatory Visit: Payer: Medicare Other | Admitting: Psychology

## 2018-02-22 ENCOUNTER — Ambulatory Visit (INDEPENDENT_AMBULATORY_CARE_PROVIDER_SITE_OTHER): Payer: Medicare Other | Admitting: Psychology

## 2018-02-22 DIAGNOSIS — F0390 Unspecified dementia without behavioral disturbance: Secondary | ICD-10-CM | POA: Diagnosis not present

## 2018-02-22 NOTE — Progress Notes (Signed)
NEUROBEHAVIORAL STATUS EXAM   Name: Ryan Humphrey Date of Birth: February 12, 1929 Date of Interview: 02/22/2018  Reason for Referral:  Ryan Humphrey is a 84 y.o. male who is referred for neuropsychological evaluation by Dr. Narda Amber of [facility] due to concerns about memory decline and probable dementia. This patient is unaccompanied in the office for today's appointment.  History of Presenting Problem:  Ryan Humphrey has been followed by Dr. Posey Pronto since September 2015. He was previously diagnosed with MCI. Over the past few years, his MoCA has dropped (was 27 in 2015, 23 in 2017, 24 in 2018, 23 in 10/2017). At his last visit with Dr. Posey Pronto in May 2019, he and his daughter noted greater difficulty with instrumental ADLs interfering with independent functioning, greater than expected for MCI. He was referred for neurocognitive evaluation to evaluate for possible dementia. It was noted that he has not tolerated cholinesterase inhibitor therapy in the past and does not want to try any alternative medications.  Brain MRI from 10/21/2017 reportedly showed several small areas of chronic hemorrhage in the anterior left MCA and bilateral PCA vascular territories, new since the 2018 MRI. These appear nonspecific, and without encephalomalacia or other corresponding discrete parenchymal signal changes. Otherwise stable noncontrast MRI appearance of the brain with fairly mild for age nonspecific signal changes in the cerebral white matter and pons.  Since his last visit with Dr. Posey Pronto in May 2019, he has moved into an assisted living facility. He reports that he did not realize he had memory problems until he moved in there. He has had great difficulty remembering names of the residents, even those with whom he eats 3 meals a day with everyday. He does remember their faces but cannot recall their names. He denies any other word finding difficulty. He does not feel he is forgetting recent conversations or  events. He does not think he is losing or misplacing things. He does admit he forgets his cane at times. He denies any difficulty with comprehension (reading or auditory). He has no difficulty navigating his new community.   He stopped driving when he moved into the facility. He states he realized he shouldn't be driving so he willingly gave his car to his daughter. He was managing his finances and meals prior to moving into the facility. Now he gets 3 meals a day at the facility, and his daughter is managing his bills. His daughter also manages his appointments. He does his own laundry. Of note, the patient reports he manages his own medications and has stopped taking all of them since moving into the facility. He stated that he cannot tell any difference in how he is doing, so he does not think they were doing anything for him. He has not told his daughter this, and he notes she would be very upset if she found out.  Physically, he reports he feels "great". He does have difficulty with balance, but his cane is helpful. He does not think he has had any falls. He does not have any trouble sleeping. His appetite is good, and he is eating better now that meals are prepared for him. He has one cocktail per night.   He denies psychiatric history. He states he has never struggled with depression. He reports his mood is good, stable. He denies any hallucinations. He denies suicidal ideation or intention. He enjoys socializing and reading. He used to play the piano daily but he gave his piano away when he moved  into the facility, and he finds he has not missed it.    Social History: Born/Raised: [place], also spent some time in [place].  He served in the [employer]. 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 He has 3 grandchildren. Alcohol: One cocktail nightly. Tobacco: None currently. He smoked for a few years a very long time ago.   Medical History: Past Medical History:  Diagnosis Date  . Anemia   . Arthritis    arthritis,osteopenia,"spinal stenosis"  . CAD (coronary artery disease)   . Cancer [facility]) 12-06-12   Prostate cancer'98  . Depression   . GERD (gastroesophageal reflux disease)   . H/O hiatal hernia   . H/O vertigo 12-06-12   none recent  . Hyperlipidemia   . IBS (irritable bowel syndrome)   . Neuropathy   . Osteopenia   . Peripheral neuropathy 12-06-12   peripheral neuropathy  . Rhinitis   . RLS (restless legs syndrome)   . Vitamin B12 deficiency      Current Medications*:  Outpatient Encounter Medications as of 02/22/2018  Medication Sig  . Cholecalciferol (VITAMIN D) 2000 UNITS tablet Take 8,000 Units by mouth daily.  Marland Kitchen gabapentin (NEURONTIN) 400 MG capsule Take 400 mg by mouth daily.   Marland Kitchen omeprazole (PRILOSEC) 40 MG capsule Take 1 capsule (40 mg total) by mouth every morning. Before breakfast  . propranolol (INDERAL) 20 MG tablet Take 1 tablet (20 mg total) by mouth 2 (two) times daily.  . sertraline (ZOLOFT) 100 MG tablet 1 pill daily for mood (Patient taking differently: Take 100 mg by mouth daily. )  . simvastatin (ZOCOR) 20 MG tablet Take 1 tablet (20 mg total) by mouth every evening.   No facility-administered encounter medications on file as of 02/22/2018.    *Patient reported he discontinued all medications when he moved into assisted living facility and is no longer taking any of the above medications.   Behavioral Observations:   Appearance: Neatly and appropriately dressed and groomed Gait: Ambulated with a cane, unsteadiness upon turning Speech: Fluent; normal rate, rhythm and volume. No significant word finding difficulty. He does repeat himself throughout the interview. Thought process: Linear, goal directed Affect: Full,  euthymic Interpersonal: Very pleasant, appropriate   40 minutes spent face-to-face with patient completing neurobehavioral status exam. 30 minutes spent integrating medical records/clinical data and completing this report. CPT code [id number] unit.   TESTING: There is medical necessity to proceed with neuropsychological assessment as the results will be used to aid in differential diagnosis and clinical decision-making and to inform specific treatment recommendations. Per the patient and medical records reviewed, there has been a change in cognitive functioning and a reasonable suspicion of dementia.  Clinical Decision Making: In considering the patient's current level of functioning, level of presumed impairment, nature of symptoms, emotional and behavioral responses during the interview, level of literacy, and observed level of motivation, a battery of tests was selected and communicated to the psychometrician.   Following the clinical interview/neurobehavioral status exam, the patient completed this full battery of neuropsychological testing with my psychometrician under my supervision (see separate note).   PLAN: The patient will return to see me for a follow-up session at which time his test performances and my impressions and treatment recommendations will be reviewed in detail.  Evaluation ongoing; full report to follow.

## 2018-02-22 NOTE — Progress Notes (Signed)
   Neuropsychology Note  Ryan Humphrey Ems completed 60 minutes of neuropsychological testing with technician, Milana Kidney, BS, under the supervision of Dr. Macarthur Critchley, Licensed Psychologist. The patient did not appear overtly distressed by the testing session, per behavioral observation or via self-report to the technician. Rest breaks were offered.   Clinical Decision Making: In considering the patient's current level of functioning, level of presumed impairment, nature of symptoms, emotional and behavioral responses during the interview, level of literacy, and observed level of motivation/effort, a battery of tests was selected and communicated to the psychometrician.  Communication between the psychologist and technician was ongoing throughout the testing session and changes were made as deemed necessary based on patient performance on testing, technician observations and additional pertinent factors such as those listed above.  Ryan Humphrey will return within approximately 2 weeks for an interactive feedback session with Dr. Si Raider at which time his test performances, clinical impressions and treatment recommendations will be reviewed in detail. The patient understands he can contact our office should he require our assistance before this time.  35 minutes spent performing neuropsychological evaluation services/clinical decision making (psychologist). [CPT 85462] 60 minutes spent face-to-face with patient administering standardized tests, 30 minutes spent scoring (technician). [CPT Y8200648, 70350]  Full report to follow.

## 2018-03-04 DIAGNOSIS — Z23 Encounter for immunization: Secondary | ICD-10-CM | POA: Diagnosis not present

## 2018-03-11 NOTE — Progress Notes (Signed)
  NEUROPSYCHOLOGICAL EVALUATION   Name:    Ryan Humphrey  Date of Birth:   11/15/1928 Date of Interview:  02/22/2018 Date of Testing:  02/22/2018   Date of Feedback:  03/13/2018       Background Information:  Reason for Referral:  Ryan Humphrey is a 84 y.o. male referred by Dr. Donika Patel to assess his current level of cognitive functioning and assist in differential diagnosis. The current evaluation consisted of a review of available medical records, an interview with the patient, and the completion of a neuropsychological testing battery. Informed consent was obtained.  History of Presenting Problem:  Ryan Humphrey has been followed by Dr. Patel since September 2015. He was previously diagnosed with MCI. Over the past few years, his MoCA has dropped (was 27 in 2015, 23 in 2017, 24 in 2018, 23 in 10/2017). At his last visit with Dr. Patel in May 2019, he and his daughter noted greater difficulty with instrumental ADLs interfering with independent functioning, greater than expected for MCI. He was referred for neurocognitive evaluation to evaluate for possible dementia. It was noted that he has not tolerated cholinesterase inhibitor therapy in the past and does not want to try any alternative medications.  Brain MRI from 10/21/2017 reportedly showed several small areas of chronic hemorrhage in the anterior left MCA and bilateral PCA vascular territories, new since the 2018 MRI. These appear nonspecific, and without encephalomalacia or other corresponding discrete parenchymal signal changes. Otherwise stable noncontrast MRI appearance of the brain with fairly mild for age nonspecific signal changes in the cerebral white matter and pons.  Since his last visit with Dr. Patel in May 2019, he has moved into an assisted living facility. He reports that he did not realize he had memory problems until he moved in there. He has had great difficulty remembering names of the residents, even those with whom  he eats 3 meals a day with everyday. He does remember their faces but cannot recall their names. He denies any other word finding difficulty. He does not feel he is forgetting recent conversations or events. He does not think he is losing or misplacing things. He does admit he forgets his cane at times. He denies any difficulty with comprehension (reading or auditory). He has no difficulty navigating his new community.   He stopped driving when he moved into the facility. He states he realized he shouldn't be driving so he willingly gave his car to his daughter. He was managing his finances and meals prior to moving into the facility. Now he gets 3 meals a day at the facility, and his daughter is managing his bills. His daughter also manages his appointments. He does his own laundry. Of note, the patient reports he manages his own medications and has stopped taking all of them since moving into the facility. He stated that he cannot tell any difference in how he is doing, so he does not think they were doing anything for him. He has not told his daughter this, and he notes she would be very upset if she found out.  Physically, he reports he feels "great". He does have difficulty with balance, but his cane is helpful. He does not think he has had any falls. He does not have any trouble sleeping. His appetite is good, and he is eating better now that meals are prepared for him. He has one cocktail per night.   He denies psychiatric history. He states he has never struggled   with depression. He reports his mood is good, stable. He denies any hallucinations. He denies suicidal ideation or intention. He enjoys socializing and reading. He used to play the piano daily but he gave his piano away when he moved into the facility, and he finds he has not missed it.    Social History: Born/Raised: [place], also spent some time in [place].  He served in the [employer] overseas. 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 He has 3 grandchildren. Alcohol: One cocktail nightly. Tobacco: None currently. He smoked for a few years a very long time ago.   Medical History:  Past Medical History:  Diagnosis Date  . Anemia   . Arthritis    arthritis,osteopenia,"spinal stenosis"  . CAD (coronary artery disease)   . Cancer ([facility]) 12-06-12   Prostate cancer'98  . Depression   . GERD (gastroesophageal reflux disease)   . H/O hiatal hernia   . H/O vertigo 12-06-12   none recent  . Hyperlipidemia   . IBS (irritable bowel syndrome)   . Neuropathy   . Osteopenia   . Peripheral neuropathy 12-06-12   peripheral neuropathy  . Rhinitis   . RLS (restless legs syndrome)   . Vitamin B12 deficiency     Current medications:  Outpatient Encounter Medications as of 03/13/2018  Medication Sig  . Cholecalciferol (VITAMIN D) 2000 UNITS tablet Take 8,000 Units by mouth daily.  . gabapentin (NEURONTIN) 400 MG capsule Take 400 mg by mouth daily.   . omeprazole (PRILOSEC) 40 MG capsule Take 1 capsule (40 mg total) by mouth every morning. Before breakfast (Patient not taking: Reported on 02/22/2018)  . propranolol (INDERAL) 20 MG tablet Take 1 tablet (20 mg total) by mouth 2 (two) times daily. (Patient not taking: Reported on 02/22/2018)  . sertraline (ZOLOFT) 100 MG tablet 1 pill daily for mood (Patient taking differently: Take 100 mg by mouth daily. )  . simvastatin (ZOCOR) 20 MG tablet Take 1 tablet (20 mg total) by mouth every evening. (Patient not taking: Reported on 02/22/2018)   No facility-administered encounter medications on file as of 03/13/2018.    NOTE: Patient reported he discontinued all medications when he moved and is no longer taking any of the above medications.  Current Examination:  Behavioral  Observations:  Appearance: Neatly and appropriately dressed and groomed Gait: Ambulated with a cane, unsteadiness upon turning Speech: Fluent; normal rate, rhythm and volume. No significant word finding difficulty. He does repeat himself throughout the interview and testing session. Thought process: Linear, goal directed Affect: Full, euthymic Interpersonal: Very pleasant, appropriate Orientation: Oriented to person, place, current month and day. Disoriented to year ("2012") and date (3 days off). Accurately named the current President but could not recall his predecessor.   Tests Administered: . Test of Premorbid Functioning (TOPF) . Wechsler Adult Intelligence Scale-Fourth Edition (WAIS-IV): Similarities, Block Design,  and Digit Span subtests . [place] Verbal Learning Test - 2nd Edition (CVLT-2) Short Form . Repeatable Battery for the Assessment of Neuropsychological Status (RBANS) Form A:  Story Memory and Story Recall, Figure Copy and Recall, Semantic Fluency and Coding subtests . Boston Naming Test (BNT) . Boston Diagnostic Aphasia Examination: Complex Ideational Material subtest . Controlled Oral Word Association Test (COWAT) . Trail Making Test A and B . Clock drawing   test . Geriatric Depression Scale (GDS) 15 Item . Generalized Anxiety Disorder - 7 item screener (GAD-7)  Test Results: Note: Standardized scores are presented only for use by appropriately trained professionals and to allow for any future test-retest comparison. These scores should not be interpreted without consideration of all the information that is contained in the rest of the report. The most recent standardization samples from the test publisher or other sources were used whenever possible to derive standard scores; scores were corrected for age, gender, ethnicity and education when available.   Test Scores:  Test Name Raw Score Standardized Score Descriptor  TOPF 61/70 SS= 120 Superior  WAIS-IV Subtests      Similarities 22/36 ss= 11 Average  Block Design 24/66 ss= 11 Average  Digit Span Forward 7/16 ss= 7 Low average  Digit Span Backward 8/16 ss= 12 High average  RBANS Subtests     Story Memory 5/24 Z= -2.6 Severely impaired  Story Recall 1/12 Z= -2.3 Impaired  Figure Copy 15/20 Z= -1.2 Low average  Figure Recall 2/20 Z= -2.3 Impaired  Semantic Fluency 13 Z= -1.2 Low average  Coding 29/89 Z= -0.7 Average  CVLT-II Scores     Trial 1 1/9 Z= -3 Severely impaired  Trial 4 4/9 Z= -2 Impaired   Trials 1-4 total 10/36 T= 19 Severely impaired  SD Free Recall 1/9 Z= -2 Impaired  LD Free Recall 0/9 Z= -1.5 Borderline   LD Cued Recall 0/9 Z= -2.5 Impaired  Recognition Hits 3/9  Z= -4 Severely impaired  Recognition False Positives 0 Z= 1.5 High average  Forced Choice Recognition 8/9  Impaired  BNT 39/60 T= 28 Impaired  BDAE Complex Ideational Material 6/8  Below expectation  COWAT-FAS 33 T= 40 Low average  COWAT-Animals 11 T= 31 Impaired  Trail Making Test A  47" 2 errors T= 55 Average  Trail Making Test B  Pt unable     Clock Drawing   WNL  GDS-15 2/15  WNL  GAD-7 0/21  WNL      Description of Test Results:  Premorbid verbal intellectual abilities were estimated to have been within the superior range based on a test of word reading.   Psychomotor processing speed was average.   Auditory attention and working memory were low average to high average.   Visual-spatial construction was low average to average.   Language abilities were significantly below expectation for age and education. Specifically, confrontation naming was impaired, and semantic verbal fluency ranged from low average to impaired. Auditory comprehension of complex ideational material was below expectation.   With regard to verbal memory, encoding and acquisition of non-contextual information (i.e., word list) was severely impaired. After a brief distracter task, free recall was impaired (1/9 items). After a  delay, free recall was borderline (0/9 items). Cued recall was impaired (0/9 items). Performance on a yes/no recognition task was severely impaired due to low recognition of target items (recognized 3/9 items). Additionally, performance on a forced choice recognition task was impaired. On another verbal memory test, encoding and acquisition of contextual auditory information (i.e., short story) was severely impaired. After a delay, free recall was impaired. With regard to non-verbal memory, delayed free recall of visual information was impaired.   Executive functioning was variable, with test performances ranging from impaired to average. Mental flexibility and set-shifting were impaired; he was unable to complete Trails [address]. Verbal fluency with phonemic search restrictions was average. Verbal abstract reasoning was average. Performance on a clock drawing task  was normal.   On a self-report measures of mood, the patient's responses were not indicative of clinically significant depression or generalized anxiety at the present time.    Clinical Impressions: Mild dementia, most likely due to Alzheimer's disease.  Results of cognitive testing were clearly abnormal and demonstrated significant decline relative to estimated premorbid intellectual abilities in the superior range. Additionally, there is evidence his cognitive deficits are interfering with his ability to manage complex ADLs (driving, medications, finances, appointments). As such, diagnostic criteria for a dementia syndrome are met.  The patient's cognitive profile is highly suggestive of hippocampal consolidation dysfunction and medial temporal lobe involvement (prominent impairments in memory and semantic retrieval). Additionally, he demonstrated some executive dysfunction. Based on his cognitive profile and clinical features, Alzheimer's disease is the most likely etiology.    Recommendations/Plan: Based on the findings of the present  evaluation, the following recommendations are offered:  1. I spoke at length with the patient about the need for assistance with management of his medication. His daughter set up his pill planner for him but he chose to stop taking his medications. He has no awareness of what his medications are and if he has them at home. I believe he needs someone to administer them to him daily. I made this recommendation to him and his daughter.  2. The patient is not driving, and I agree with that decision 3. From a neuropsychological perspective, the patient is a candidate for cholinesterase inhibitor therapy. He has been on Aricept in the past and there was a question of whether he could tolerate it. He had GI distress but it lasted long after he stopped the medication. He and his daughter would like to follow up with Dr. Patel about possible medication (Aricept or an alternative). 4. He was encouraged to continue his involvement in social engagement, mentally stimulating activities and safe cardiovascular exercise.   Feedback to Patient: Ryan Humphrey and his daughter returned for a feedback appointment on 03/13/2018 to review the results of his neuropsychological evaluation with this provider. 35 minutes face-to-face time was spent reviewing his test results, my impressions and my recommendations as detailed above.    Total time spent on this patient's case: 70 minutes for neurobehavioral status exam with psychologist (CPT code 96116); 90 minutes of testing/scoring by psychometrician under psychologist's supervision (CPT codes 96138, 96139x2 units); 180 minutes for integration of patient data, interpretation of standardized test results and clinical data, clinical decision making, treatment planning and preparation of this report, and interactive feedback with review of results to the patient/family by psychologist (CPT codes 96132, 96133x2 units).      Thank you for your referral of Ryan Humphrey. Please  feel free to contact me if you have any questions or concerns regarding this report.     

## 2018-03-11 NOTE — Progress Notes (Deleted)
MEDICARE ANNUAL WELLNESS VISIT AND FOLLOW UP Assessment:    Essential hypertension, benign -well controlled -monitor at home -dash diet -exercise as tolerated - TSH   Atherosclerosis of native coronary artery of native heart without angina pectoris -cont dash diet -d/c statin due to age  MCI (mild cognitive impairment) -cont exelon -followed by neurology   Hereditary and idiopathic peripheral neuropathy -followed by neurology   Benign essential tremor -cont propranolol   Primary osteoarthritis of right knee -weight control -tylenol prn   Bladder neck obstruction -followed by urology   Malignant neoplasm of prostate ([place]) -followed by urology  Mixed hyperlipidemia D/c statin - Lipid panel  INTERMITTENT VERTIGO -cont meds prn  Vitamin D deficiency -cont Vit D   Medication management - CBC with Differential/Platelet - BASIC METABOLIC PANEL WITH GFR - Hepatic function panel  Imbalance Will do PT due to fall risk/decreased strength  Advanced care counseling/discussion Discussed advanced care planning with patient  Medicare visits 1 year Over 30 minutes of exam, counseling, chart review, and critical decision making was performed  Future Appointments  Date Time Provider [place]  03/13/2018 11:15 AM Vicie Mutters, PA-C [facility] None  03/13/2018  3:00 PM Kandis Nab, PsyD [facility] None  06/21/2018 10:00 AM Unk Pinto, MD [facility] None     Plan:   During the course of the visit the patient was educated and counseled about appropriate screening and preventive services including:    Pneumococcal vaccine   Influenza vaccine  Prevnar 13  Td vaccine  Screening electrocardiogram  Colorectal cancer screening  Diabetes screening  Glaucoma screening  Nutrition counseling    Subjective:  Ryan Humphrey is a 84 y.o. male who presents for Medicare Annual Wellness Visit and 3 month follow up for HTN, hyperlipidemia,  prediabetes, and vitamin D Def.   His blood pressure has been controlled at home, today their BP is   He does not workout. He denies chest pain, shortness of breath, dizziness.  He had possible TIA in May, now on 325mg  ASA, He had normal echo, normal carotid dopplers. He has had 2 falls, states his legs are not as strong and  He has a history of prostate cancer s/p surgery in 2014.   He is on cholesterol medication and denies myalgias. His cholesterol is at goal. The cholesterol last visit was:   Lab Results  Component Value Date   CHOL 168 12/01/2017   HDL 38 (L) 12/01/2017   LDLCALC 107 (H) 12/01/2017   TRIG 120 12/01/2017   CHOLHDL 4.4 12/01/2017   Patient does not have a history of diabetes or prediabetes, however he has neuropathy and is on gabapentin.  He continue to eat a healthy diet.   Lab Results  Component Value Date   HGBA1C 5.0 12/01/2017   Last GFR Lab Results  Component Value Date   GFRNONAA 47 (L) 01/18/2018    Patient is on Vitamin D supplement.   Lab Results  Component Value Date   VD25OH 47 12/01/2017     BMI is There is no height or weight on file to calculate BMI., he is working on diet and exercise. Wt Readings from Last 3 Encounters:  12/01/17 165 lb 3.2 oz (74.9 kg)  11/09/17 164 lb 2 oz (74.4 kg)  08/25/17 167 lb 3.2 oz (75.8 kg)     Medication Review: Current Outpatient Medications on File Prior to Visit  Medication Sig Dispense Refill  . Cholecalciferol (VITAMIN D) 2000 UNITS tablet Take 8,000 Units by mouth  daily.    . gabapentin (NEURONTIN) 400 MG capsule Take 400 mg by mouth daily.     Marland Kitchen omeprazole (PRILOSEC) 40 MG capsule Take 1 capsule (40 mg total) by mouth every morning. Before breakfast (Patient not taking: Reported on 02/22/2018) 30 capsule 3  . propranolol (INDERAL) 20 MG tablet Take 1 tablet (20 mg total) by mouth 2 (two) times daily. (Patient not taking: Reported on 02/22/2018) 180 tablet 3  . sertraline (ZOLOFT) 100 MG tablet 1 pill  daily for mood (Patient taking differently: Take 100 mg by mouth daily. ) 90 tablet 1  . simvastatin (ZOCOR) 20 MG tablet Take 1 tablet (20 mg total) by mouth every evening. (Patient not taking: Reported on 02/22/2018) 90 tablet 3   No current facility-administered medications on file prior to visit.     Allergies: Allergies  Allergen Reactions  . Niacin Itching  . Penicillins Other (See Comments)    "rash" Has patient had a PCN reaction causing immediate rash, facial/tongue/throat swelling, SOB or lightheadedness with hypotension: Yes Has patient had a PCN reaction causing severe rash involving mucus membranes or skin necrosis: No Has patient had a PCN reaction that required hospitalization: No Has patient had a PCN reaction occurring within the last 10 years: No If all of the above answers are "NO", then may proceed with Cephalosporin use.   . Tramadol Itching    Current Problems (verified) has Mixed hyperlipidemia; Essential hypertension, benign; CORONARY ATHEROSCLEROSIS NATIVE CORONARY ARTERY; INTERMITTENT VERTIGO; Primary osteoarthritis of right knee; Vitamin D deficiency; Medication management; MCI (mild cognitive impairment); Hereditary and idiopathic peripheral neuropathy; Benign essential tremor; Bladder neck obstruction; Malignant neoplasm of prostate ([place]); History of TIA (transient ischemic attack); Vitamin B12 deficiency; GERD (gastroesophageal reflux disease); and CKD (chronic kidney disease) stage 3, GFR 30-59 ml/min (HCC) on their problem list.  Screening Tests Immunization History  Administered Date(s) Administered  . Influenza, High Dose Seasonal PF 03/05/2015  . Influenza-Unspecified 04/22/2016, 03/13/2017  . Pneumococcal Conjugate-13 05/23/2017  . Pneumococcal Polysaccharide-23 01/20/2016    Preventative care: Last colonoscopy: 2014  Prior vaccinations: TD or Tdap: Declined Due to cost Influenza: Due in September  Pneumococcal: 2016 Prevnar13:  Today Shingles/Zostavax: Declined  Names of Other Physician/Practitioners you currently use: 1. [facility] here for primary care 2. [facility], eye doctor, last visit 2016 3. [facility], dentist, last visit 2017 Patient Care Team: Unk Pinto, MD as PCP - General (Internal Medicine)  Surgical: He  has a past surgical history that includes Appendectomy; Cholecystectomy; Carpal tunnel release; Prostate surgery (12-06-12); cataract surgery (Left, 12-06-12); Esophagogastroduodenoscopy (egd) with propofol (N/A, 12/25/2012); Colonoscopy with propofol (N/A, 12/25/2012); Tonsillectomy; and Total knee arthroplasty (Right, 11/25/2014). Family His family history includes CAD in his father; Dementia in his brother and mother; Diabetes in his brother; Hyperlipidemia in his brother; Hypertension in his brother. Social history  He reports that he quit smoking about 54 years ago. He has a 15.00 pack-year smoking history. He has never used smokeless tobacco. He reports that he drinks alcohol. He reports that he does not use drugs.  MEDICARE WELLNESS OBJECTIVES: Physical activity:   Cardiac risk factors:   Depression/mood screen:   Depression screen [facility] 2/9 12/03/2017  Decreased Interest 0  Down, Depressed, Hopeless 0  PHQ - 2 Score 0    ADLs:  In your present state of health, do you have any difficulty performing the following activities: 12/03/2017 08/17/2017  Hearing? N Y  Vision? N N  Difficulty  concentrating or making decisions? N N  Walking or climbing stairs? N N  Dressing or bathing? N N  Doing errands, shopping? N N  Some recent data might be hidden    Archivist neuro  EOL planning:     Objective:   There were no vitals filed for this visit. There is no height or weight on file to calculate BMI.  General appearance: alert, no distress, WD/WN, male HEENT: normocephalic, sclerae anicteric, TMs pearly, nares  patent, no discharge or erythema, pharynx normal Oral cavity: MMM, no lesions Neck: supple, no lymphadenopathy, no thyromegaly, no masses Heart: RRR, normal S1, S2, no murmurs Lungs: CTA bilaterally, no wheezes, rhonchi, or rales Abdomen: +bs, soft, non tender, non distended, no masses, no hepatomegaly, no splenomegaly Musculoskeletal: nontender, no swelling, no obvious deformity Extremities: no edema, no cyanosis, no clubbing Pulses: 2+ symmetric, upper and lower extremities, normal cap refill Neurological: alert, oriented x 3, CN2-12 intact, strength normal upper extremities and decreased strength bilateral lower extremities, sensation normal throughout, DTRs 2+ throughout, no cerebellar signs, gait slow and antalgic Psychiatric: normal affect, behavior normal, pleasant   Medicare Attestation I have personally reviewed: The patient's medical and social history Their use of alcohol, tobacco or illicit drugs Their current medications and supplements The patient's functional ability including ADLs,fall risks, home safety risks, cognitive, and hearing and visual impairment Diet and physical activities Evidence for depression or mood disorders  The patient's weight, height, BMI, and visual acuity have been recorded in the chart.  I have made referrals, counseling, and provided education to the patient based on review of the above and I have provided the patient with a written personalized care plan for preventive services.     Vicie Mutters, PA-C   03/11/2018

## 2018-03-12 ENCOUNTER — Encounter: Payer: BLUE CROSS/BLUE SHIELD | Admitting: Psychology

## 2018-03-13 ENCOUNTER — Encounter: Payer: Self-pay | Admitting: Psychology

## 2018-03-13 ENCOUNTER — Ambulatory Visit (INDEPENDENT_AMBULATORY_CARE_PROVIDER_SITE_OTHER): Payer: Medicare Other | Admitting: Psychology

## 2018-03-13 ENCOUNTER — Ambulatory Visit: Payer: Self-pay | Admitting: Adult Health

## 2018-03-13 ENCOUNTER — Ambulatory Visit: Payer: Self-pay | Admitting: Physician Assistant

## 2018-03-13 DIAGNOSIS — G301 Alzheimer's disease with late onset: Secondary | ICD-10-CM | POA: Diagnosis not present

## 2018-03-13 DIAGNOSIS — F0280 Dementia in other diseases classified elsewhere without behavioral disturbance: Secondary | ICD-10-CM

## 2018-03-17 ENCOUNTER — Other Ambulatory Visit: Payer: Self-pay

## 2018-03-17 ENCOUNTER — Encounter (HOSPITAL_COMMUNITY): Payer: Self-pay

## 2018-03-17 ENCOUNTER — Emergency Department (HOSPITAL_COMMUNITY): Payer: Medicare Other

## 2018-03-17 ENCOUNTER — Emergency Department (HOSPITAL_COMMUNITY)
Admission: EM | Admit: 2018-03-17 | Discharge: 2018-03-17 | Disposition: A | Discharge status: Home / Self Care | Payer: Medicare Other | Attending: Emergency Medicine | Admitting: Emergency Medicine

## 2018-03-17 DIAGNOSIS — Z96651 Presence of right artificial knee joint: Secondary | ICD-10-CM | POA: Insufficient documentation

## 2018-03-17 DIAGNOSIS — Z8673 Personal history of transient ischemic attack (TIA), and cerebral infarction without residual deficits: Secondary | ICD-10-CM | POA: Insufficient documentation

## 2018-03-17 DIAGNOSIS — Z87891 Personal history of nicotine dependence: Secondary | ICD-10-CM | POA: Insufficient documentation

## 2018-03-17 DIAGNOSIS — I2510 Atherosclerotic heart disease of native coronary artery without angina pectoris: Secondary | ICD-10-CM | POA: Diagnosis not present

## 2018-03-17 DIAGNOSIS — Z79899 Other long term (current) drug therapy: Secondary | ICD-10-CM | POA: Insufficient documentation

## 2018-03-17 DIAGNOSIS — R079 Chest pain, unspecified: Secondary | ICD-10-CM | POA: Insufficient documentation

## 2018-03-17 DIAGNOSIS — Z8546 Personal history of malignant neoplasm of prostate: Secondary | ICD-10-CM | POA: Diagnosis not present

## 2018-03-17 LAB — CBC
HEMATOCRIT: 41.3 % (ref 39.0–52.0)
HEMOGLOBIN: 13.9 g/dL (ref 13.0–17.0)
MCH: 32.3 pg (ref 26.0–34.0)
MCHC: 33.7 g/dL (ref 30.0–36.0)
MCV: 96 fL (ref 78.0–100.0)
Platelets: 173 10*3/uL (ref 150–400)
RBC: 4.3 MIL/uL (ref 4.22–5.81)
RDW: 13 % (ref 11.5–15.5)
WBC: 8 10*3/uL (ref 4.0–10.5)

## 2018-03-17 LAB — BASIC METABOLIC PANEL
Anion gap: 7 (ref 5–15)
BUN: 29 mg/dL — ABNORMAL HIGH (ref 8–23)
CHLORIDE: 110 mmol/L (ref 98–111)
CO2: 29 mmol/L (ref 22–32)
Calcium: 9.6 mg/dL (ref 8.9–10.3)
Creatinine, Ser: 1.61 mg/dL — ABNORMAL HIGH (ref 0.61–1.24)
GFR calc Af Amer: 43 mL/min — ABNORMAL LOW (ref >60)
GFR, EST NON AFRICAN AMERICAN: 37 mL/min — AB (ref >60)
GLUCOSE: 111 mg/dL — AB (ref 70–99)
POTASSIUM: 4.6 mmol/L (ref 3.5–5.1)
Sodium: 146 mmol/L — ABNORMAL HIGH (ref 135–145)

## 2018-03-17 LAB — I-STAT TROPONIN, ED
TROPONIN I, POC: 0 ng/mL (ref 0.00–0.08)
Troponin i, poc: 0.01 ng/mL (ref 0.00–0.08)

## 2018-03-17 MED ORDER — ASPIRIN 81 MG PO CHEW
324.0000 mg | CHEWABLE_TABLET | Freq: Once | ORAL | Status: AC
  Administered 2018-03-17: 324 mg via ORAL
  Filled 2018-03-17: qty 4

## 2018-03-17 NOTE — ED Triage Notes (Signed)
Pt states that he had chest pain last night, which has since resolved. Pt states pain was on both sides, and unlike a pain he has had before.

## 2018-03-17 NOTE — ED Provider Notes (Addendum)
[facility] DEPT Provider Note   CSN: [id number] Arrival date & time: 03/17/18  0831     History   Chief Complaint Chief Complaint  Patient presents with  . Chest Pain    HPI Ryan Humphrey is a 84 y.o. male.  HPI  84 year old male comes in with chief complaint of chest pain. Patient has history of mild CAD based on 2011 cath.  Patient reports that yesterday around 10 PM he had a sudden onset generalized chest pain.  The chest pain was moderately severe and lasted for about 30 minutes and then resolved on its own.  Patient did not want to come to the ED last night, therefore since the pain resolved he just decided to come in in the morning for evaluation.  Patient states that he was just resting when the pain started.  Pain was nonradiating and there was no associated shortness of breath, dizziness, diaphoresis, nausea.  Patient has had similar chest pain once every 5 or 6 months in the past.  Over the past few days he has not noted any exertional shortness of breath or chest pain.  There is no history of PE, DVT.  Past Medical History:  Diagnosis Date  . Anemia   . Arthritis    arthritis,osteopenia,"spinal stenosis"  . CAD (coronary artery disease)   . Cancer [facility]) 12-06-12   Prostate cancer'98  . Depression   . GERD (gastroesophageal reflux disease)   . H/O hiatal hernia   . H/O vertigo 12-06-12   none recent  . Hyperlipidemia   . IBS (irritable bowel syndrome)   . Neuropathy   . Osteopenia   . Peripheral neuropathy 12-06-12   peripheral neuropathy  . Rhinitis   . RLS (restless legs syndrome)   . Vitamin B12 deficiency     Patient Active Problem List   Diagnosis Date Noted  . CKD (chronic kidney disease) stage 3, GFR 30-59 ml/min (HCC) 08/17/2017  . History of TIA (transient ischemic attack) 11/05/2016  . Vitamin B12 deficiency 11/05/2016  . GERD (gastroesophageal reflux disease) 11/05/2016  . MCI (mild cognitive impairment)  08/20/2015  . Hereditary and idiopathic peripheral neuropathy 08/20/2015  . Benign essential tremor 08/20/2015  . Vitamin D deficiency 01/06/2015  . Medication management 01/06/2015  . Primary osteoarthritis of right knee 11/25/2014  . Bladder neck obstruction 03/28/2011  . Malignant neoplasm of prostate ([place]) 03/28/2011  . INTERMITTENT VERTIGO 10/12/2009  . Mixed hyperlipidemia 04/10/2009  . Essential hypertension, benign 04/10/2009  . CORONARY ATHEROSCLEROSIS NATIVE CORONARY ARTERY 04/10/2009    Past Surgical History:  Procedure Laterality Date  . APPENDECTOMY    . CARPAL TUNNEL RELEASE     both hands  . cataract surgery Left 12-06-12   recent surgery  . CHOLECYSTECTOMY    . COLONOSCOPY WITH PROPOFOL N/A 12/25/2012   Procedure: COLONOSCOPY WITH PROPOFOL;  Surgeon: Garlan Fair, MD;  Location: [facility];  Service: Endoscopy;  Laterality: N/A;  . ESOPHAGOGASTRODUODENOSCOPY (EGD) WITH PROPOFOL N/A 12/25/2012   Procedure: ESOPHAGOGASTRODUODENOSCOPY (EGD) WITH PROPOFOL;  Surgeon: Garlan Fair, MD;  Location: [facility];  Service: Endoscopy;  Laterality: N/A;  . PROSTATE SURGERY  12-06-12  . TONSILLECTOMY    . TOTAL KNEE ARTHROPLASTY Right 11/25/2014   Procedure: RIGHT TOTAL KNEE ARTHROPLASTY;  Surgeon: Melrose Nakayama, MD;  Location: [facility];  Service: Orthopedics;  Laterality: Right;        Home Medications    Prior to Admission medications   Medication Sig Start Date End  Date Taking? Authorizing Provider  Cholecalciferol (VITAMIN D) 2000 UNITS tablet Take 2,000 Units by mouth daily.    Yes [provider]  gabapentin (NEURONTIN) 400 MG capsule Take 400 mg by mouth 3 (three) times daily.    Yes [provider]  sertraline (ZOLOFT) 100 MG tablet 1 pill daily for mood Patient taking differently: Take 100 mg by mouth daily.  02/15/17 03/17/18 Yes Vicie Mutters, PA-C  omeprazole (PRILOSEC) 40 MG capsule Take 1 capsule (40 mg total) by mouth every morning.  Before breakfast Patient not taking: Reported on 02/22/2018 04/05/17   Willia Craze, NP  propranolol (INDERAL) 20 MG tablet Take 1 tablet (20 mg total) by mouth 2 (two) times daily. Patient not taking: Reported on 02/22/2018 10/14/15   Unk Pinto, MD  simvastatin (ZOCOR) 20 MG tablet Take 1 tablet (20 mg total) by mouth every evening. Patient not taking: Reported on 02/22/2018 10/14/15   Unk Pinto, MD    Family History Family History  Problem Relation Age of Onset  . CAD Father        Deceased, 84  . Dementia Mother        Deceased, 58  . Diabetes Brother   . Hyperlipidemia Brother   . Hypertension Brother   . Dementia Brother   . Colon cancer Neg Hx   . Colon polyps Neg Hx   . Kidney disease Neg Hx   . Esophageal cancer Neg Hx   . Gallbladder disease Neg Hx     Social History Social History   Tobacco Use  . Smoking status: Former Smoker    Packs/day: 0.50    Years: 30.00    Pack years: 15.00    Last attempt to quit: 06/14/1963    Years since quitting: 54.7  . Smokeless tobacco: Never Used  Substance Use Topics  . Alcohol use: Yes    Alcohol/week: 0.0 standard drinks    Comment: Socially  . Drug use: No     Allergies   Niacin; Penicillins; and Tramadol   Review of Systems Review of Systems  Constitutional: Negative for activity change and diaphoresis.  Respiratory: Negative for shortness of breath.   Cardiovascular: Positive for chest pain.  Gastrointestinal: Negative for nausea.  Neurological: Negative for dizziness.  All other systems reviewed and are negative.    Physical Exam Updated Vital Signs BP (!) 119/103   Pulse 65   Temp 98.5 F (36.9 C) (Oral)   Resp 18   Ht 6' (1.829 m)   Wt 72.6 kg   SpO2 100%   BMI 21.70 kg/m   Physical Exam  Constitutional: He is oriented to person, place, and time. He appears well-developed.  HENT:  Head: Atraumatic.  Neck: Neck supple.  Cardiovascular: Normal rate, intact distal pulses and normal  pulses.  Pulmonary/Chest: Effort normal.  Neurological: He is alert and oriented to person, place, and time.  Skin: Skin is warm.  Nursing note and vitals reviewed.    ED Treatments / Results  Labs (all labs ordered are listed, but only abnormal results are displayed) Labs Reviewed  BASIC METABOLIC PANEL - Abnormal; Notable for the following components:      Result Value   Sodium 146 (*)    Glucose, Bld 111 (*)    BUN 29 (*)    Creatinine, Ser 1.61 (*)    GFR calc non Af Amer 37 (*)    GFR calc Af Amer 43 (*)    All other components within normal limits  CBC  I-STAT TROPONIN, ED  I-STAT TROPONIN, ED    EKG EKG Interpretation  Date/Time:  Saturday March 17 2018 08:37:24 EDT Ventricular Rate:  71 PR Interval:    QRS Duration: 151 QT Interval:  417 QTC Calculation: 626 R Axis:   -21 Text Interpretation:  Sinus rhythm Right bundle branch block No acute changes No significant change since last tracing Confirmed by Varney Biles [phone]) on 03/17/2018 8:45:50 AM Also confirmed by Varney Biles [phone]), editor Hattie Perch ([id number])  on 03/17/2018 10:18:19 AM   Radiology Dg Chest 2 View  Result Date: 03/17/2018 CLINICAL DATA:  Mid chest pain last night. Ex-smoker. EXAM: CHEST - 2 VIEW COMPARISON:  10/21/2017. FINDINGS: Normal sized heart. Tortuous and partially calcified thoracic aorta. Clear lungs with normal vascularity. Stable biapical pleural thickening. Thoracic spine degenerative changes and mild scoliosis. IMPRESSION: No acute abnormality. Electronically Signed   By: Claudie Revering M.D.   On: 03/17/2018 09:08    Procedures Procedures (including critical care time)  Medications Ordered in ED Medications  aspirin chewable tablet 324 mg (324 mg Oral Given 03/17/18 1158)     Initial Impression / Assessment and Plan / ED Course  I have reviewed the triage vital signs and the nursing notes.  Pertinent labs & imaging results that were available during my care of  the patient were reviewed by me and considered in my medical decision making (see chart for details).  Clinical Course as of Mar 17 1245  Sat Mar 17, 2018  1242 Patient reassessed him to 2 times.  He continues to be chest pain-free.  Delta troponins are negative.  Patient is comfortable going home.  Results from the ER have been discussed with the patient.  We will send in noticed to the cardiology group, to see if they can get the patient an expedited appointment.  In the interim, strict ER return precautions have been discussed with the patient.   [AN]    Clinical Course User Index [AN] Varney Biles, MD    Differential diagnosis includes: ACS syndrome Valvular disorder Myocarditis Pericarditis Endocarditis Pneumothorax Musculoskeletal pain PUD / Gastritis / Esophagitis Esophageal spasm   84 year old male comes in with chief complaint of chest discomfort.  Chest discomfort occurred last night, it was generalized chest pain that was nonradiating.  Symptoms lasted for 30 minutes and then have resolved.  There were no associated constitutional's that are overall concerning, initial EKG is normal.  It is difficult to ascertain what the etiology for the chest discomfort was.  Patient has known mild CAD -therefore ACS is likely the worst possible etiology.  He has been pain-free since last night, plan is to get delta troponin if they are negative then we will try to set up an outpatient follow-up.  Clinical concerns are extremely low for PE, and the pain was provoked without p.o. intake therefore does not sound like esophageal spasms.  Final Clinical Impressions(s) / ED Diagnoses   Final diagnoses:  Nonspecific chest pain    ED Discharge Orders    None       Varney Biles, MD 03/17/18 [place], [place], MD 03/17/18 1246

## 2018-03-17 NOTE — Discharge Instructions (Addendum)
We saw you in the ER for the chest pain. All of our cardiac workup is normal, including labs, EKG and chest X-RAY are normal. We are not sure what is causing your discomfort, but we feel comfortable sending you home at this time. The workup in the ER is not complete, and you should follow up with your heart specialist for further evaluation.  Expect a call from our cardiology team on Monday for a follow-up appointment.  If you do not hear from them by 1 PM, only then call the number provided for the cardiology team for a follow-up appointment. Please return to the ER if you have worsening chest pain, shortness of breath, pain radiating to your jaw, shoulder, or back, sweats or fainting. Otherwise see the Cardiologist or your primary care doctor as requested.

## 2018-04-04 ENCOUNTER — Encounter: Payer: Self-pay | Admitting: Internal Medicine

## 2018-04-04 ENCOUNTER — Telehealth: Payer: Self-pay

## 2018-04-04 ENCOUNTER — Ambulatory Visit (INDEPENDENT_AMBULATORY_CARE_PROVIDER_SITE_OTHER): Payer: Medicare Other | Admitting: Internal Medicine

## 2018-04-04 VITALS — BP 110/60 | HR 72 | Ht 70.0 in | Wt 164.0 lb

## 2018-04-04 DIAGNOSIS — I25119 Atherosclerotic heart disease of native coronary artery with unspecified angina pectoris: Secondary | ICD-10-CM | POA: Diagnosis not present

## 2018-04-04 DIAGNOSIS — F3341 Major depressive disorder, recurrent, in partial remission: Secondary | ICD-10-CM | POA: Diagnosis not present

## 2018-04-04 DIAGNOSIS — G3184 Mild cognitive impairment, so stated: Secondary | ICD-10-CM

## 2018-04-04 DIAGNOSIS — R0789 Other chest pain: Secondary | ICD-10-CM | POA: Diagnosis not present

## 2018-04-04 MED ORDER — SIMVASTATIN 20 MG PO TABS: 20.0000 mg | ORAL_TABLET | Freq: Every evening | ORAL | 3 refills | Status: DC

## 2018-04-04 NOTE — Patient Instructions (Addendum)
Medication Instructions:  Your physician recommends that you continue on your current medications as directed. Please refer to the Current Medication list given to you today.  Your Simvastatin has been refilled today  If you need a refill on your cardiac medications before your next appointment, please call your pharmacy.   Lab work: Art gallery manager today  If you have labs (blood work) drawn today and your tests are completely normal, you will receive your results only by: Marland Kitchen MyChart Message (if you have MyChart) OR . A paper copy in the mail If you have any lab test that is abnormal or we need to change your treatment, we will call you to review the results.  Testing/Procedures: Your physician has requested that you have cardiac CT. Cardiac computed tomography (CT) is a painless test that uses an x-ray machine to take clear, detailed pictures of your heart. For further information please visit [email or web address]. Please follow instruction sheet as given.      Follow-Up: At [facility], you and your health needs are our priority.  As part of our continuing mission to provide you with exceptional heart care, we have created designated Provider Care Teams.  These Care Teams include your primary Cardiologist (physician) and Advanced Practice Providers (APPs -  Physician Assistants and Nurse Practitioners) who all work together to provide you with the care you need, when you need it. You will need a follow up appointment in 6-8 weeks.  Please call our office 2 months in advance to schedule this appointment.  You may see Elouise Munroe, MD or one of the following Advanced Practice Providers on your designated Care Team:   Rosaria Ferries, PA-C . Jory Sims, DNP, ANP  Any Other Special Instructions Will Be Listed Below (If Applicable). Please arrive at the [facility] main entrance of [facility] at TBD AM (30-45 minutes prior to test start time)  [facility] [place], [place] [address] [phone]  Proceed to the [facility] Radiology Department (First Floor).  Please follow these instructions carefully (unless otherwise directed):  Hold all erectile dysfunction medications at least 48 hours prior to test.  On the Night Before the Test: . Be sure to Drink plenty of water. . Do not consume any caffeinated/decaffeinated beverages or chocolate 12 hours prior to your test. . Do not take any antihistamines 12 hours prior to your test. I On the Day of the Test: . Drink plenty of water. Do not drink any water within one hour of the test. . Do not eat any food 4 hours prior to the test. . You may take your regular medications prior to the test.      After the Test: . Drink plenty of water. . After receiving IV contrast, you may experience a mild flushed feeling. This is normal. . On occasion, you may experience a mild rash up to 24 hours after the test. This is not dangerous. If this occurs, you can take Benadryl 25 mg and increase your fluid intake. . If you experience trouble breathing, this can be serious. If it is severe call 911 IMMEDIATELY. If it is mild, please call our office. . If you take any of these medications: Glipizide/Metformin, Avandament, Glucavance, please do not take 48 hours after completing test.

## 2018-04-04 NOTE — Consult Note (Signed)
Cardiology Office Note:    Date:  04/04/2018   ID:  Ryan Humphrey, DOB [birth year], MRN [id number]  PCP:  Unk Pinto, MD  Cardiologist:  Elouise Munroe, MD  Electrophysiologist:  None   Referring MD: Varney Biles, MD   Chest pain; ED follow up.   History of Present Illness:    Ryan Humphrey is a 84 y.o. male with a hx of mild CAD by cath in 2010, with 40% proximal LAD disease and 50% ostial diagonal disease. No significant disease in the RCA or Circumflex.  He presents after ER visit for sudden chest pain that lasts for 30 minutes, usually in the evening. These attacks resolve on their own, and he has no clear factors that provoke or alleviate his symptoms. He had similar symptoms in 2016 and saw my colleague Dr. Dorris Humphrey. A stress myoview was performed and was negative for inducible ischemia. Ejection fraction was normal.   He denies dyspnea at rest or with exertion, palpitations, PND, orthopnea, or leg swelling. Denies syncope or presyncope. He feels entirely back to normal after these episodes, and cannot pinpoint any factors that are associated to avoid. Denies snoring. Scores 13 on the Epworth sleepiness scale.  He exercises in a gym class and takes the stairs to the third floor which he is able to complete without having to stop for chest pain or shortness of breath. He denies concomitant abdominal pain or GI distress with the chest pain, and cannot associate any foods that may correlate.  Past Medical History:  Diagnosis Date  . Anemia   . Arthritis    arthritis,osteopenia,"spinal stenosis"  . CAD (coronary artery disease)   . Cancer [facility]) 12-06-12   Prostate cancer'98  . Depression   . GERD (gastroesophageal reflux disease)   . H/O hiatal hernia   . H/O vertigo 12-06-12   none recent  . Hyperlipidemia   . IBS (irritable bowel syndrome)   . Neuropathy   . Osteopenia   . Peripheral neuropathy 12-06-12   peripheral neuropathy  . Rhinitis   . RLS (restless  legs syndrome)   . Vitamin B12 deficiency     Past Surgical History:  Procedure Laterality Date  . APPENDECTOMY    . CARPAL TUNNEL RELEASE     both hands  . cataract surgery Left 12-06-12   recent surgery  . CHOLECYSTECTOMY    . COLONOSCOPY WITH PROPOFOL N/A 12/25/2012   Procedure: COLONOSCOPY WITH PROPOFOL;  Surgeon: Garlan Fair, MD;  Location: [facility];  Service: Endoscopy;  Laterality: N/A;  . ESOPHAGOGASTRODUODENOSCOPY (EGD) WITH PROPOFOL N/A 12/25/2012   Procedure: ESOPHAGOGASTRODUODENOSCOPY (EGD) WITH PROPOFOL;  Surgeon: Garlan Fair, MD;  Location[facility];  Service: Endoscopy;  Laterality: N/A;  . PROSTATE SURGERY  12-06-12  . TONSILLECTOMY    . TOTAL KNEE ARTHROPLASTY Right 11/25/2014   Procedure: RIGHT TOTAL KNEE ARTHROPLASTY;  Surgeon: Melrose Nakayama, MD;  Location: [place];  Service: Orthopedics;  Laterality: Right;    Current Medications: Current Meds  Medication Sig  . Cholecalciferol (VITAMIN D) 2000 UNITS tablet Take 2,000 Units by mouth daily.   Marland Kitchen gabapentin (NEURONTIN) 400 MG capsule Take 400 mg by mouth 3 (three) times daily.      Allergies:   Niacin; Penicillins; and Tramadol   Social History   Socioeconomic History  . Marital status: Widowed    Spouse name: Not on file  . Number of children: 2  . Years of education: College  . Highest education level:  Not on file  Occupational History  . Occupation: Retired  Scientific laboratory technician  . Financial resource strain: Not on file  . Food insecurity:    Worry: Not on file    Inability: Not on file  . Transportation needs:    Medical: Not on file    Non-medical: Not on file  Tobacco Use  . Smoking status: Former Smoker    Packs/day: 0.50    Years: 30.00    Pack years: 15.00    Last attempt to quit: 06/14/1963    Years since quitting: 54.8  . Smokeless tobacco: Never Used  Substance and Sexual Activity  . Alcohol use: Yes    Alcohol/week: 0.0 standard drinks    Comment: Socially  . Drug use: No    . Sexual activity: Not Currently  Lifestyle  . Physical activity:    Days per week: Not on file    Minutes per session: Not on file  . Stress: Not on file  Relationships  . Social connections:    Talks on phone: Not on file    Gets together: Not on file    Attends religious service: Not on file    Active member of club or organization: Not on file    Attends meetings of clubs or organizations: Not on file    Relationship status: Not on file  Other Topics Concern  . Not on file  Social History Narrative   Pt lives at home alone, widowed in 2011, married for 55 years. They have two grown daugthers.   Plays the piano.   Caffeine Use: Rarely     Family History: The patient's family history includes CAD in his father; Dementia in his brother and mother; Diabetes in his brother; Hyperlipidemia in his brother; Hypertension in his brother. There is no history of Colon cancer, Colon polyps, Kidney disease, Esophageal cancer, or Gallbladder disease.  ROS:   Please see the history of present illness.    All other systems reviewed and are negative.  EKGs/Labs/Other Studies Reviewed:    The following studies were reviewed today:   EKG:  EKG is ordered today.  The ekg ordered today demonstrates normal sinus rhythm, left axis deviation, right bundle branch block QRS duration 146 ms, ventricular rate 72 bpm.  Recent Labs: 12/01/2017: ALT 5; Magnesium 2.1; TSH 2.44 03/17/2018: BUN 29; Creatinine, Ser 1.61; Hemoglobin 13.9; Platelets 173; Potassium 4.6; Sodium 146  Recent Lipid Panel    Component Value Date/Time   CHOL 168 12/01/2017 1026   TRIG 120 12/01/2017 1026   HDL 38 (L) 12/01/2017 1026   CHOLHDL 4.4 12/01/2017 1026   VLDL 26 11/05/2016 0409   LDLCALC 107 (H) 12/01/2017 1026    Physical Exam:    VS:  BP 110/60   Pulse 72   Ht 5\' 10"  (1.778 m)   Wt 164 lb (74.4 kg)   BMI 23.53 kg/m     Wt Readings from Last 3 Encounters:  04/04/18 164 lb (74.4 kg)  03/17/18 160 lb  (72.6 kg)  12/01/17 165 lb 3.2 oz (74.9 kg)     GEN: Well nourished, well developed in no acute distress HEENT: Normal NECK: No JVD; No carotid bruits LYMPHATICS: No lymphadenopathy CARDIAC: RRR, no murmurs, rubs, gallops RESPIRATORY:  Clear to auscultation without rales, wheezing or rhonchi  ABDOMEN: Soft, non-tender, non-distended MUSCULOSKELETAL:  No edema; No deformity  SKIN: Warm and dry NEUROLOGIC:  Alert and oriented x 3 PSYCHIATRIC:  Normal affect   ASSESSMENT:  1. Chest pressure   2. MCI (mild cognitive impairment)   3. Depression, major, recurrent, in partial remission ([place])   4. Coronary artery disease involving native heart with angina pectoris, unspecified vessel or lesion type ([place])    PLAN:    In order of problems listed above:  It is unclear what is causing his chest pain that he states comes on for 30 minutes and then remits without intervention.  He has had similar symptoms and was stressed in 2016 with no inducible ischemia.  It may be reasonable given his history of mild coronary artery disease in 2010 to reevaluate his coronaries.  This can be done with a CTA coronary arteries.  We will extend the field-of-view to evaluate for any thoracic pathology that may be leading to his chest pain.   We will refill his simvastatin 20 mg daily today.   Medication Adjustments/Labs and Tests Ordered: Current medicines are reviewed at length with the patient today.  Concerns regarding medicines are outlined above.  Orders Placed This Encounter  Procedures  . CT CORONARY MORPH W/CTA COR W/SCORE W/CA W/CM &/OR WO/CM  . Basic metabolic panel  . EKG 12-Lead   Meds ordered this encounter  Medications  . simvastatin (ZOCOR) 20 MG tablet    Sig: Take 1 tablet (20 mg total) by mouth every evening.    Dispense:  90 tablet    Refill:  3    Patient Instructions  Medication Instructions:  Your physician recommends that you continue on your current medications as  directed. Please refer to the Current Medication list given to you today.  Your Simvastatin has been refilled today  If you need a refill on your cardiac medications before your next appointment, please call your pharmacy.   Lab work: Art gallery manager today  If you have labs (blood work) drawn today and your tests are completely normal, you will receive your results only by: Marland Kitchen MyChart Message (if you have MyChart) OR . A paper copy in the mail If you have any lab test that is abnormal or we need to change your treatment, we will call you to review the results.  Testing/Procedures: Your physician has requested that you have cardiac CT. Cardiac computed tomography (CT) is a painless test that uses an x-ray machine to take clear, detailed pictures of your heart. For further information please visit [email or web address][facility][email or web address]. Please follow instruction sheet as given.      Follow-Up: At [facility], you and your health needs are our priority.  As part of our continuing mission to provide you with exceptional heart care, we have created designated Provider Care Teams.  These Care Teams include your primary Cardiologist (physician) and Advanced Practice Providers (APPs -  Physician Assistants and Nurse Practitioners) who all work together to provide you with the care you need, when you need it. You will need a follow up appointment in 6-8 weeks.  Please call our office 2 months in advance to schedule this appointment.  You may see Elouise Munroe, MD or one of the following Advanced Practice Providers on your designated Care Team:   Rosaria Ferries, PA-C . Jory Sims, DNP, ANP  Any Other Special Instructions Will Be Listed Below (If Applicable). Please arrive at the [facility] main entrance of [facility] at TBD AM (30-45 minutes prior to test start time)  [facility] [place], [place] [id number] [phone]  Proceed to the [facility] Radiology Department  (First Floor).  Please  follow these instructions carefully (unless otherwise directed):  Hold all erectile dysfunction medications at least 48 hours prior to test.  On the Night Before the Test: . Be sure to Drink plenty of water. . Do not consume any caffeinated/decaffeinated beverages or chocolate 12 hours prior to your test. . Do not take any antihistamines 12 hours prior to your test. I On the Day of the Test: . Drink plenty of water. Do not drink any water within one hour of the test. . Do not eat any food 4 hours prior to the test. . You may take your regular medications prior to the test.      After the Test: . Drink plenty of water. . After receiving IV contrast, you may experience a mild flushed feeling. This is normal. . On occasion, you may experience a mild rash up to 24 hours after the test. This is not dangerous. If this occurs, you can take Benadryl 25 mg and increase your fluid intake. . If you experience trouble breathing, this can be serious. If it is severe call 911 IMMEDIATELY. If it is mild, please call our office. . If you take any of these medications: Glipizide/Metformin, Avandament, Glucavance, please do not take 48 hours after completing test.         Signed, Elouise Munroe, MD  04/04/2018 5:35 PM    [place]

## 2018-04-04 NOTE — Telephone Encounter (Signed)
Lmtcb. Pt coronary CT will need to be scheduled at [facility]. Pt was given incorrect info at o/v

## 2018-04-05 LAB — BASIC METABOLIC PANEL
BUN/Creatinine Ratio: 16 (ref 10–24)
BUN: 24 mg/dL (ref 8–27)
CALCIUM: 8.9 mg/dL (ref 8.6–10.2)
CO2: 26 mmol/L (ref 20–29)
CREATININE: 1.47 mg/dL — AB (ref 0.76–1.27)
Chloride: 104 mmol/L (ref 96–106)
GFR calc Af Amer: 49 mL/min/{1.73_m2} — ABNORMAL LOW (ref >59)
GFR, EST NON AFRICAN AMERICAN: 42 mL/min/{1.73_m2} — AB (ref >59)
Glucose: 85 mg/dL (ref 65–99)
Potassium: 4.8 mmol/L (ref 3.5–5.2)
Sodium: 143 mmol/L (ref 134–144)

## 2018-04-05 NOTE — Telephone Encounter (Signed)
2nd attempt to reach the pt daughter Valene Bors.  Per pt all appt will need to be scheduled with [name].  Called an spoke with the pt. Adv him that I have left 2 voicemails for [name]. Pt sts that she is a Pharmacist, hospital and is probably in the classroom. Asked pt to have [name] call me at her convenience. Adv pt that his cor CT will need to be at [facility]. I will need to mail the instructions to him. He asked if the could be given to his daughter [name]. [name] will need to provide me her address.  Adv pt that I will fwd the message to [facility] to get the ball rolling on scheduling the test and I will await [name]'s return phone call. Pt voiced appreciation for the call.

## 2018-04-12 NOTE — Telephone Encounter (Signed)
-----   Message from Elouise Munroe, MD sent at 04/11/2018  6:10 PM EDT ----- Impaired renal function, stable over time.

## 2018-04-12 NOTE — Telephone Encounter (Signed)
DPR on file. lmom with results for pt daughter Ryan Humphrey.

## 2018-05-04 ENCOUNTER — Telehealth: Payer: Self-pay

## 2018-05-04 DIAGNOSIS — R079 Chest pain, unspecified: Secondary | ICD-10-CM

## 2018-05-04 DIAGNOSIS — R072 Precordial pain: Secondary | ICD-10-CM

## 2018-05-04 NOTE — Telephone Encounter (Signed)
Original order placed for Cor CTA on 04/04/18. Not viewable by scheduling new order placed.

## 2018-05-16 ENCOUNTER — Telehealth: Payer: Self-pay | Admitting: Neurology

## 2018-05-16 ENCOUNTER — Telehealth: Payer: Self-pay

## 2018-05-16 NOTE — Telephone Encounter (Signed)
Called pt daughter Gwenlyn Found on file. Unable to lmom. [name]'s voicemail is full. Will attempt again. Pt is scheduled to see Dr.Acharya on 05/21/18 the appt was to be after the pt Cor CTA. Pt test is still awaiting scheduling. Called to see if we need to reschedule until aftre testing is complete, or keep the upcoming appt if the pt is still symptomatic.

## 2018-05-16 NOTE — Telephone Encounter (Signed)
Patient was calling about the MRI results please call

## 2018-05-21 ENCOUNTER — Ambulatory Visit: Payer: Medicare Other | Admitting: Internal Medicine

## 2018-05-22 ENCOUNTER — Encounter: Payer: Self-pay | Admitting: *Deleted

## 2018-05-22 NOTE — Telephone Encounter (Signed)
Left message informing patient that Dr. Posey Pronto has not ordered MRI on him.

## 2018-05-22 NOTE — Telephone Encounter (Signed)
Pt of Dr. Ena Dawley.  No recent MRI in system.

## 2018-05-25 ENCOUNTER — Encounter: Payer: Self-pay | Admitting: Internal Medicine

## 2018-06-15 ENCOUNTER — Ambulatory Visit: Payer: BLUE CROSS/BLUE SHIELD | Admitting: Neurology

## 2018-06-15 ENCOUNTER — Encounter

## 2018-06-21 ENCOUNTER — Encounter: Payer: Self-pay | Admitting: Internal Medicine

## 2018-06-26 ENCOUNTER — Telehealth: Payer: Self-pay | Admitting: *Deleted

## 2018-06-26 NOTE — Telephone Encounter (Signed)
Patient called and states he has had diarrhea x 2 weeks.  Per Dr Melford Aase, he can try Imodium tablets, up to 12 tabs daily, for diarrhea.

## 2018-07-26 ENCOUNTER — Encounter: Payer: Self-pay | Admitting: Internal Medicine

## 2018-07-26 ENCOUNTER — Other Ambulatory Visit: Payer: Self-pay

## 2018-07-26 ENCOUNTER — Ambulatory Visit (INDEPENDENT_AMBULATORY_CARE_PROVIDER_SITE_OTHER): Payer: Medicare Other | Admitting: Internal Medicine

## 2018-07-26 VITALS — BP 120/80 | HR 88 | Temp 97.8°F | Ht 70.0 in | Wt 170.2 lb

## 2018-07-26 DIAGNOSIS — Z136 Encounter for screening for cardiovascular disorders: Secondary | ICD-10-CM

## 2018-07-26 DIAGNOSIS — N401 Enlarged prostate with lower urinary tract symptoms: Secondary | ICD-10-CM

## 2018-07-26 DIAGNOSIS — I10 Essential (primary) hypertension: Secondary | ICD-10-CM

## 2018-07-26 DIAGNOSIS — G609 Hereditary and idiopathic neuropathy, unspecified: Secondary | ICD-10-CM

## 2018-07-26 DIAGNOSIS — R7309 Other abnormal glucose: Secondary | ICD-10-CM

## 2018-07-26 DIAGNOSIS — E538 Deficiency of other specified B group vitamins: Secondary | ICD-10-CM | POA: Diagnosis not present

## 2018-07-26 DIAGNOSIS — Z125 Encounter for screening for malignant neoplasm of prostate: Secondary | ICD-10-CM

## 2018-07-26 DIAGNOSIS — E782 Mixed hyperlipidemia: Secondary | ICD-10-CM

## 2018-07-26 DIAGNOSIS — N138 Other obstructive and reflux uropathy: Secondary | ICD-10-CM

## 2018-07-26 DIAGNOSIS — E559 Vitamin D deficiency, unspecified: Secondary | ICD-10-CM | POA: Diagnosis not present

## 2018-07-26 DIAGNOSIS — R7303 Prediabetes: Secondary | ICD-10-CM

## 2018-07-26 DIAGNOSIS — I700 Atherosclerosis of aorta: Secondary | ICD-10-CM

## 2018-07-26 DIAGNOSIS — Z1211 Encounter for screening for malignant neoplasm of colon: Secondary | ICD-10-CM

## 2018-07-26 DIAGNOSIS — Z87891 Personal history of nicotine dependence: Secondary | ICD-10-CM

## 2018-07-26 DIAGNOSIS — N183 Chronic kidney disease, stage 3 unspecified: Secondary | ICD-10-CM

## 2018-07-26 DIAGNOSIS — I2510 Atherosclerotic heart disease of native coronary artery without angina pectoris: Secondary | ICD-10-CM

## 2018-07-26 DIAGNOSIS — Z8249 Family history of ischemic heart disease and other diseases of the circulatory system: Secondary | ICD-10-CM

## 2018-07-26 DIAGNOSIS — K219 Gastro-esophageal reflux disease without esophagitis: Secondary | ICD-10-CM | POA: Diagnosis not present

## 2018-07-26 DIAGNOSIS — Z79899 Other long term (current) drug therapy: Secondary | ICD-10-CM

## 2018-07-26 DIAGNOSIS — Z1212 Encounter for screening for malignant neoplasm of rectum: Secondary | ICD-10-CM

## 2018-07-26 MED ORDER — SIMVASTATIN 20 MG PO TABS: 20.0000 mg | ORAL_TABLET | Freq: Every evening | ORAL | 1 refills | Status: DC

## 2018-07-26 MED ORDER — PROPRANOLOL HCL 20 MG PO TABS: 20.0000 mg | ORAL_TABLET | Freq: Two times a day (BID) | ORAL | 1 refills | Status: DC

## 2018-07-26 MED ORDER — OMEPRAZOLE 40 MG PO CPDR: 40.0000 mg | DELAYED_RELEASE_CAPSULE | ORAL | 1 refills | Status: DC

## 2018-07-26 NOTE — Progress Notes (Signed)
[facility]   Unk Pinto, M.D.     Uvaldo Bristle. Silverio Lay, P.A.-C Liane Comber, Lindsay                [address] [place], [place] [address] Telephone [phone] Telefax [phone]  Comprehensive Evaluation & Examination     This very nice 85 y.o. [facility] presents for a comprehensive evaluation and management of multiple medical co-morbidities.  Patient has been followed for HTN, HLD, Prediabetes and Vitamin D Deficiency. Patient is also followed by Dr Posey Pronto for vascular Dementia, Lumbar Sinal Stenosis,  peripheral sensory Neuropathy and AS Cerebrovascular Disease and Familial Tremor. Patient has hx/o B12 Deficiency ("305" / Dec 2018).      HTN predates since 1990's.  Patient's BP has been controlled at home.  Today's BP is at goal - 120/80. Patient had a negative Stress Myoview in 2016. Patient had been recommended a Cor CTA & he chose not to doit as he felt well. Patient denies any cardiac symptoms as chest pain, palpitations, shortness of breath, dizziness or ankle swelling.     Patient's hyperlipidemia is not controlled with diet.  Chol meds are age deferred. Last lipids were not at goal: Lab Results  Component Value Date   CHOL 168 12/01/2017   HDL 38 (L) 12/01/2017   LDLCALC 107 (H) 12/01/2017   TRIG 120 12/01/2017   CHOLHDL 4.4 12/01/2017      Patient has been monitored expectantly for glucose intolerance  / PreDiabetes and patient denies reactive hypoglycemic symptoms, visual blurring, diabetic polys or paresthesias. Last A1c was Normal & at goal: Lab Results  Component Value Date   HGBA1C 5.0 12/01/2017       Finally, patient has history of Vitamin D Deficiency ("42" / 2016)  and last vitamin D was still low:  Lab Results  Component Value Date   VD25OH 47 12/01/2017   Current Outpatient Medications on File Prior to Visit  Medication Sig  . Cholecalciferol (VITAMIN D) 2000 UNITS  tablet Take 2,000 Units by mouth daily.   Marland Kitchen gabapentin (NEURONTIN) 400 MG capsule Take 400 mg by mouth 3 (three) times daily.   . sertraline (ZOLOFT) 100 MG tablet 1 pill daily for mood (Patient taking differently: Take 100 mg by mouth daily. )   No current facility-administered medications on file prior to visit.    Allergies  Allergen Reactions  . Niacin Itching  . Penicillins Other (See Comments)    "rash"   . Tramadol Itching   Past Medical History:  Diagnosis Date  . Anemia   . Arthritis    arthritis,osteopenia,"spinal stenosis"  . CAD (coronary artery disease)   . Cancer [facility]) 12-06-12   Prostate cancer'98  . Depression   . GERD (gastroesophageal reflux disease)   . H/O hiatal hernia   . H/O vertigo 12-06-12   none recent  . Hyperlipidemia   . IBS (irritable bowel syndrome)   . Neuropathy   . Osteopenia   . Peripheral neuropathy 12-06-12   peripheral neuropathy  . Rhinitis   . RLS (restless legs syndrome)   . Vitamin B12 deficiency    Health Maintenance  Topic Date Due  . FOOT EXAM  03/26/1939  . OPHTHALMOLOGY EXAM  03/26/1939  . TETANUS/TDAP  03/26/1950  . INFLUENZA VACCINE  01/11/2018  . URINE MICROALBUMIN  05/24/2018  . HEMOGLOBIN A1C  06/02/2018  .  PNA vac Low Risk Adult  Completed   Immunization History  Administered Date(s) Administered  . Influenza, High Dose Seasonal PF 03/05/2015  . Influenza-Unspecified 04/22/2016, 03/13/2017  . Pneumococcal Conjugate-13 05/23/2017  . Pneumococcal Polysaccharide-23 01/20/2016   Last Colon -  Past Surgical History:  Procedure Laterality Date  . APPENDECTOMY    . CARPAL TUNNEL RELEASE     both hands  . cataract surgery Left 12-06-12   recent surgery  . CHOLECYSTECTOMY    . COLONOSCOPY WITH PROPOFOL N/A 12/25/2012   Procedure: COLONOSCOPY WITH PROPOFOL;  Surgeon: Garlan Fair, MD;  [facility];  Service: Endoscopy;  Laterality: N/A;  . ESOPHAGOGASTRODUODENOSCOPY (EGD) WITH PROPOFOL N/A 12/25/2012    Procedure: ESOPHAGOGASTRODUODENOSCOPY (EGD) WITH PROPOFOL;  Surgeon: Garlan Fair, MD;  [facility];  Service: Endoscopy;  Laterality: N/A;  . PROSTATE SURGERY  12-06-12  . TONSILLECTOMY    . TOTAL KNEE ARTHROPLASTY Right 11/25/2014   Procedure: RIGHT TOTAL KNEE ARTHROPLASTY;  Surgeon: Melrose Nakayama, MD;  Location: [place];  Service: Orthopedics;  Laterality: Right;   Family History  Problem Relation Age of Onset  . CAD Father        Deceased, 42  . Dementia Mother        Deceased, 27  . Diabetes Brother   . Hyperlipidemia Brother   . Hypertension Brother   . Dementia Brother   . Colon cancer Neg Hx   . Colon polyps Neg Hx   . Kidney disease Neg Hx   . Esophageal cancer Neg Hx   . Gallbladder disease Neg Hx    Social History   Socioeconomic History  . Marital status: Widowed  . Number of children: 2  . Years of education: College  . Highest education level: Not on file  Occupational History  . Occupation: Retired  Tobacco Use  . Smoking status: Former Smoker    Packs/day: 0.50    Years: 30.00    Pack years: 15.00    Last attempt to quit: 06/14/1963    Years since quitting: 55.1  . Smokeless tobacco: Never Used  Substance and Sexual Activity  . Alcohol use: Yes    Alcohol/week: 0.0 standard drinks    Comment: Socially  . Drug use: No  . Sexual activity: Not Currently  Social History Narrative   Pt lives at home alone, widowed in 2011, married for 55 years. They have two grown daugthers.   Plays the piano.   Caffeine Use: Rarely    ROS Constitutional: Denies fever, chills, weight loss/gain, headaches, insomnia,  night sweats or change in appetite. Does c/o fatigue. Eyes: Denies redness, blurred vision, diplopia, discharge, itchy or watery eyes.  ENT: Denies discharge, congestion, post nasal drip, epistaxis, sore throat, earache, hearing loss, dental pain, Tinnitus, Vertigo, Sinus pain or snoring.  Cardio: Denies chest pain, palpitations, irregular  heartbeat, syncope, dyspnea, diaphoresis, orthopnea, PND, claudication or edema Respiratory: denies cough, dyspnea, DOE, pleurisy, hoarseness, laryngitis or wheezing.  Gastrointestinal: Denies dysphagia, heartburn, reflux, water brash, pain, cramps, nausea, vomiting, bloating, diarrhea, constipation, hematemesis, melena, hematochezia, jaundice or hemorrhoids Genitourinary: Denies dysuria, frequency, urgency, nocturia, hesitancy, discharge, hematuria or flank pain Musculoskeletal: Denies arthralgia, myalgia, stiffness, Jt. Swelling, pain, limp or strain/sprain. Denies Falls. Skin: Denies puritis, rash, hives, warts, acne, eczema or change in skin lesion Neuro: No weakness, tremor, incoordination, spasms, paresthesia or pain Psychiatric: Denies confusion, memory loss or sensory loss. Denies Depression. Endocrine: Denies change in weight, skin, hair change, nocturia, and paresthesia, diabetic polys, visual  blurring or hyper / hypo glycemic episodes.  Heme/Lymph: No excessive bleeding, bruising or enlarged lymph nodes.  Physical Exam  BP 120/80   Pulse 88   Temp 97.8 F (36.6 C)   Ht 5\' 10"  (1.778 m)   Wt 170 lb 3.2 oz (77.2 kg)   SpO2 99%   BMI 24.42 kg/m   General Appearance: Well nourished and well groomed and in no apparent distress.  Eyes: PERRLA, EOMs, conjunctiva no swelling or erythema, normal fundi and vessels. Sinuses: No frontal/maxillary tenderness ENT/Mouth: EACs patent / TMs  nl. Nares clear without erythema, swelling, mucoid exudates. Oral hygiene is good. No erythema, swelling, or exudate. Tongue normal, non-obstructing. Tonsils not swollen or erythematous. Hearing normal.  Neck: Supple, thyroid not palpable. No bruits, nodes or JVD. Respiratory: Respiratory effort normal.  BS equal and clear bilateral without rales, rhonci, wheezing or stridor. Cardio: Heart sounds are normal with regular rate and rhythm and no murmurs, rubs or gallops. Peripheral pulses are normal and  equal bilaterally without edema. No aortic or femoral bruits. Chest: symmetric with normal excursions and percussion.  Abdomen: Soft, with Nl bowel sounds. Nontender, no guarding, rebound, hernias, masses, or organomegaly.  Lymphatics: Non tender without lymphadenopathy.  Musculoskeletal: Full ROM all peripheral extremities, joint stability, 5/5 strength, and normal gait. Skin: Warm and dry without rashes, lesions, cyanosis, clubbing or  ecchymosis.  Neuro: Cranial nerves intact, reflexes equal bilaterally. Normal muscle tone, no cerebellar symptoms. Sensation intact to touch and Monofilament to the toes bilaterally, but sl decreased to vibratory perception Pysch: Alert and oriented X 3 with normal affect, insight and judgment appropriate. Short term recall is limited, but reasoning seems appropriate.   Assessment and Plan  1. Essential hypertension, benign  - EKG 12-Lead - [name], RETROPERITNL ABD,  LTD - Urinalysis, Routine w reflex microscopic - Microalbumin / creatinine urine ratio - CBC with Differential/Platelet - COMPLETE METABOLIC PANEL WITH GFR - Magnesium - TSH  2. Hyperlipidemia, mixed  - EKG 12-Lead - [name], RETROPERITNL ABD,  LTD - Lipid panel - TSH  3. Abnormal glucose  - EKG 12-Lead - [name], RETROPERITNL ABD,  LTD - Hemoglobin A1c - Insulin, random  4. Vitamin D deficiency  - VITAMIN D 25 Hydroxyl  5. Vitamin B12 deficiency  - Vitamin B12  6. Prediabetes  - EKG 12-Lead - [name], RETROPERITNL ABD,  LTD - Hemoglobin A1c - Insulin, random  7. CKD (chronic kidney disease) stage 3, GFR 30-59 ml/min (HCC)  8. Atherosclerosis of native coronary artery of native heart without angina pectoris  - EKG 12-Lead  9. Gastroesophageal reflux disease without esophagitis  - CBC with Differential/Platelet  10. Hereditary and idiopathic peripheral neuropathy  - Vitamin B12 - Hemoglobin A1c  11. Screening for colorectal cancer  - POC Hemoccult Bld/Stl  12. BPH with  obstruction/lower urinary tract symptoms  - PSA  13. Prostate cancer screening  - PSA  14. Screening for ischemic heart disease  - EKG 12-Lead  15. Former smoker  - EKG 12-Lead - [name], RETROPERITNL ABD,  LTD  16. FHx: heart disease  - EKG 12-Lead - [name], RETROPERITNL ABD,  LTD  17. Aortic atherosclerosis (HCC)  - [name], RETROPERITNL ABD,  LTD  18. Screening for AAA (aortic abdominal aneurysm)  - [name], RETROPERITNL ABD,  LTD  19. Medication management  - Urinalysis, Routine w reflex microscopic - Microalbumin / creatinine urine ratio - Vitamin B12 - CBC with Differential/Platelet - COMPLETE METABOLIC PANEL WITH GFR - Magnesium - Lipid panel -  TSH - Hemoglobin A1c - Insulin, random - VITAMIN D 25 Hydroxyl        Patient was counseled in prudent diet, weight control to achieve/maintain BMI less than 25, BP monitoring, regular exercise and medications as discussed.  Discussed med effects and SE's. Routine screening labs and tests as requested with regular follow-up as recommended. Over 40 minutes of exam, counseling, chart review and high complex critical decision making was performed

## 2018-07-26 NOTE — Patient Instructions (Addendum)

## 2018-07-27 LAB — URINALYSIS, ROUTINE W REFLEX MICROSCOPIC
BILIRUBIN URINE: NEGATIVE
GLUCOSE, UA: NEGATIVE
Hgb urine dipstick: NEGATIVE
Ketones, ur: NEGATIVE
Leukocytes,Ua: NEGATIVE
Nitrite: NEGATIVE
Protein, ur: NEGATIVE
SPECIFIC GRAVITY, URINE: 1.02 (ref 1.001–1.03)

## 2018-07-27 LAB — CBC WITH DIFFERENTIAL/PLATELET
Absolute Monocytes: 640 cells/uL (ref 200–950)
Basophils Absolute: 20 cells/uL (ref 0–200)
Basophils Relative: 0.3 %
EOS PCT: 1.5 %
Eosinophils Absolute: 99 cells/uL (ref 15–500)
HCT: 35.7 % — ABNORMAL LOW (ref 38.5–50.0)
Hemoglobin: 12.2 g/dL — ABNORMAL LOW (ref 13.2–17.1)
Lymphs Abs: 1254 cells/uL (ref 850–3900)
MCH: 32 pg (ref 27.0–33.0)
MCHC: 34.2 g/dL (ref 32.0–36.0)
MCV: 93.7 fL (ref 80.0–100.0)
MPV: 11.5 fL (ref 7.5–12.5)
Monocytes Relative: 9.7 %
NEUTROS ABS: 4587 {cells}/uL (ref 1500–7800)
NEUTROS PCT: 69.5 %
Platelets: 134 10*3/uL — ABNORMAL LOW (ref 140–400)
RBC: 3.81 10*6/uL — AB (ref 4.20–5.80)
RDW: 12 % (ref 11.0–15.0)
Total Lymphocyte: 19 %
WBC: 6.6 10*3/uL (ref 3.8–10.8)

## 2018-07-27 LAB — TSH: TSH: 2.62 mIU/L (ref 0.40–4.50)

## 2018-07-27 LAB — COMPLETE METABOLIC PANEL WITH GFR
AG RATIO: 2.3 (calc) (ref 1.0–2.5)
ALT: 6 U/L — AB (ref 9–46)
AST: 11 U/L (ref 10–35)
Albumin: 4.3 g/dL (ref 3.6–5.1)
Alkaline phosphatase (APISO): 57 U/L (ref 35–144)
BUN/Creatinine Ratio: 15 (calc) (ref 6–22)
BUN: 25 mg/dL (ref 7–25)
CALCIUM: 9.2 mg/dL (ref 8.6–10.3)
CO2: 27 mmol/L (ref 20–32)
CREATININE: 1.67 mg/dL — AB (ref 0.70–1.11)
Chloride: 107 mmol/L (ref 98–110)
GFR, EST NON AFRICAN AMERICAN: 36 mL/min/{1.73_m2} — AB (ref >=60)
GFR, Est African American: 42 mL/min/{1.73_m2} — ABNORMAL LOW (ref >=60)
GLUCOSE: 89 mg/dL (ref 65–99)
Globulin: 1.9 g/dL (calc) (ref 1.9–3.7)
POTASSIUM: 4.6 mmol/L (ref 3.5–5.3)
Sodium: 143 mmol/L (ref 135–146)
Total Bilirubin: 0.5 mg/dL (ref 0.2–1.2)
Total Protein: 6.2 g/dL (ref 6.1–8.1)

## 2018-07-27 LAB — MICROALBUMIN / CREATININE URINE RATIO
Creatinine, Urine: 140 mg/dL (ref 20–320)
Microalb Creat Ratio: 6 mcg/mg creat (ref <30)
Microalb, Ur: 0.8 mg/dL

## 2018-07-27 LAB — INSULIN, RANDOM: Insulin: 5.3 u[IU]/mL (ref 2.0–19.6)

## 2018-07-27 LAB — HEMOGLOBIN A1C
Hgb A1c MFr Bld: 5.3 % of total Hgb (ref <5.7)
MEAN PLASMA GLUCOSE: 105 (calc)
eAG (mmol/L): 5.8 (calc)

## 2018-07-27 LAB — LIPID PANEL
CHOL/HDL RATIO: 5 (calc) — AB (ref <5.0)
Cholesterol: 154 mg/dL (ref <200)
HDL: 31 mg/dL — AB (ref >=40)
LDL Cholesterol (Calc): 97 mg/dL (calc)
Non-HDL Cholesterol (Calc): 123 mg/dL (calc) (ref <130)
Triglycerides: 163 mg/dL — ABNORMAL HIGH (ref <150)

## 2018-07-27 LAB — MAGNESIUM: Magnesium: 2 mg/dL (ref 1.5–2.5)

## 2018-07-27 LAB — PSA: PSA: 1.1 ng/mL (ref <=4.0)

## 2018-07-27 LAB — VITAMIN B12: VITAMIN B 12: 299 pg/mL (ref 200–1100)

## 2018-07-27 LAB — VITAMIN D 25 HYDROXY (VIT D DEFICIENCY, FRACTURES): VIT D 25 HYDROXY: 31 ng/mL (ref 30–100)

## 2018-07-30 ENCOUNTER — Encounter: Payer: Self-pay | Admitting: *Deleted

## 2018-08-01 ENCOUNTER — Telehealth: Payer: Self-pay | Admitting: *Deleted

## 2018-08-01 ENCOUNTER — Other Ambulatory Visit: Payer: Self-pay | Admitting: *Deleted

## 2018-08-01 MED ORDER — SIMVASTATIN 20 MG PO TABS: 20.0000 mg | ORAL_TABLET | Freq: Every evening | ORAL | 1 refills | Status: DC

## 2018-08-01 MED ORDER — OMEPRAZOLE 40 MG PO CPDR: 40.0000 mg | DELAYED_RELEASE_CAPSULE | ORAL | 1 refills | Status: DC

## 2018-08-01 MED ORDER — PROPRANOLOL HCL 20 MG PO TABS: 20.0000 mg | ORAL_TABLET | Freq: Two times a day (BID) | ORAL | 1 refills | Status: DC

## 2018-08-01 NOTE — Telephone Encounter (Signed)
Patient called and requested refills be sent to his home, but he does not a mail service, as far as our office is aware of. The patient inquired about the closest [place] to his new address at [facility] assisted [facility]. His refills were sent to the [facility] on [address].  A message was left informing his daughter, Ryan Humphrey, regarding the conversation with her father.

## 2018-09-04 ENCOUNTER — Encounter (HOSPITAL_COMMUNITY): Payer: Self-pay

## 2018-09-04 ENCOUNTER — Emergency Department (HOSPITAL_COMMUNITY)
Admission: EM | Admit: 2018-09-04 | Discharge: 2018-09-05 | Disposition: A | Discharge status: Home / Self Care | Payer: Medicare Other | Attending: Emergency Medicine | Admitting: Emergency Medicine

## 2018-09-04 ENCOUNTER — Other Ambulatory Visit: Payer: Self-pay

## 2018-09-04 ENCOUNTER — Emergency Department (HOSPITAL_COMMUNITY): Payer: Medicare Other

## 2018-09-04 DIAGNOSIS — R200 Anesthesia of skin: Secondary | ICD-10-CM | POA: Insufficient documentation

## 2018-09-04 DIAGNOSIS — Z79899 Other long term (current) drug therapy: Secondary | ICD-10-CM | POA: Diagnosis not present

## 2018-09-04 DIAGNOSIS — I4510 Unspecified right bundle-branch block: Secondary | ICD-10-CM | POA: Diagnosis not present

## 2018-09-04 DIAGNOSIS — Z8673 Personal history of transient ischemic attack (TIA), and cerebral infarction without residual deficits: Secondary | ICD-10-CM | POA: Diagnosis not present

## 2018-09-04 DIAGNOSIS — Z87891 Personal history of nicotine dependence: Secondary | ICD-10-CM | POA: Insufficient documentation

## 2018-09-04 DIAGNOSIS — Z8546 Personal history of malignant neoplasm of prostate: Secondary | ICD-10-CM | POA: Diagnosis not present

## 2018-09-04 DIAGNOSIS — D696 Thrombocytopenia, unspecified: Secondary | ICD-10-CM | POA: Diagnosis not present

## 2018-09-04 DIAGNOSIS — N183 Chronic kidney disease, stage 3 (moderate): Secondary | ICD-10-CM | POA: Diagnosis not present

## 2018-09-04 DIAGNOSIS — R202 Paresthesia of skin: Secondary | ICD-10-CM | POA: Diagnosis not present

## 2018-09-04 DIAGNOSIS — D649 Anemia, unspecified: Secondary | ICD-10-CM | POA: Insufficient documentation

## 2018-09-04 DIAGNOSIS — R2681 Unsteadiness on feet: Secondary | ICD-10-CM | POA: Insufficient documentation

## 2018-09-04 DIAGNOSIS — I129 Hypertensive chronic kidney disease with stage 1 through stage 4 chronic kidney disease, or unspecified chronic kidney disease: Secondary | ICD-10-CM | POA: Diagnosis not present

## 2018-09-04 DIAGNOSIS — E785 Hyperlipidemia, unspecified: Secondary | ICD-10-CM | POA: Insufficient documentation

## 2018-09-04 DIAGNOSIS — N289 Disorder of kidney and ureter, unspecified: Secondary | ICD-10-CM

## 2018-09-04 DIAGNOSIS — I2510 Atherosclerotic heart disease of native coronary artery without angina pectoris: Secondary | ICD-10-CM | POA: Insufficient documentation

## 2018-09-04 DIAGNOSIS — R42 Dizziness and giddiness: Secondary | ICD-10-CM | POA: Diagnosis not present

## 2018-09-04 DIAGNOSIS — R531 Weakness: Secondary | ICD-10-CM | POA: Diagnosis not present

## 2018-09-04 NOTE — ED Triage Notes (Signed)
Pt presents with tingling in both lower legs with R>L x 1 hour.  Pt reports no tingling on arrival.  Pt reports having balance issues for a few months but that has not increased.

## 2018-09-04 NOTE — ED Provider Notes (Signed)
[facility] EMERGENCY DEPARTMENT Provider Note   CSN: [id number] Arrival date & time: 09/04/18  2238    History   Chief Complaint Numbness in right leg  HPI Ryan Humphrey is a 85 y.o. male.  The history is provided by the patient.  He has history of hyperlipidemia, coronary artery disease, prostate cancer, peripheral neuropathy and comes in because of an episode where he said that his legs did not feel likely belong to him.  On further questioning, it was just his right leg.  He noticed at about 7 PM, when he got out of a chair, his right leg did not feel like it belonged to him and he nearly fell.  He did not actually fall.  He denies any numbness in his right arm or in the right side of his face.  He denies any weakness.  Last known normal would have been at about 630, when he sat down in the chair.  He denies any headache.  He is feeling somewhat better.  Past Medical History:  Diagnosis Date  . Anemia   . Arthritis    arthritis,osteopenia,"spinal stenosis"  . CAD (coronary artery disease)   . Cancer [facility]) 12-06-12   Prostate cancer'98  . Depression   . GERD (gastroesophageal reflux disease)   . H/O hiatal hernia   . H/O vertigo 12-06-12   none recent  . Hyperlipidemia   . IBS (irritable bowel syndrome)   . Neuropathy   . Osteopenia   . Peripheral neuropathy 12-06-12   peripheral neuropathy  . Rhinitis   . RLS (restless legs syndrome)   . Vitamin B12 deficiency     Patient Active Problem List   Diagnosis Date Noted  . CKD (chronic kidney disease) stage 3, GFR 30-59 ml/min (HCC) 08/17/2017  . History of TIA (transient ischemic attack) 11/05/2016  . Vitamin B12 deficiency 11/05/2016  . GERD (gastroesophageal reflux disease) 11/05/2016  . MCI (mild cognitive impairment) 08/20/2015  . Hereditary and idiopathic peripheral neuropathy 08/20/2015  . Benign essential tremor 08/20/2015  . Vitamin D deficiency 01/06/2015  . Medication management 01/06/2015   . Primary osteoarthritis of right knee 11/25/2014  . Bladder neck obstruction 03/28/2011  . Malignant neoplasm of prostate ([place]) 03/28/2011  . INTERMITTENT VERTIGO 10/12/2009  . Mixed hyperlipidemia 04/10/2009  . Essential hypertension, benign 04/10/2009  . CORONARY ATHEROSCLEROSIS NATIVE CORONARY ARTERY 04/10/2009    Past Surgical History:  Procedure Laterality Date  . APPENDECTOMY    . CARPAL TUNNEL RELEASE     both hands  . cataract surgery Left 12-06-12   recent surgery  . CHOLECYSTECTOMY    . COLONOSCOPY WITH PROPOFOL N/A 12/25/2012   Procedure: COLONOSCOPY WITH PROPOFOL;  Surgeon: Garlan Fair, MD;  Location: [facility];  Service: Endoscopy;  Laterality: N/A;  . ESOPHAGOGASTRODUODENOSCOPY (EGD) WITH PROPOFOL N/A 12/25/2012   Procedure: ESOPHAGOGASTRODUODENOSCOPY (EGD) WITH PROPOFOL;  Surgeon: Garlan Fair, MD;  Location: [facility];  Service: Endoscopy;  Laterality: N/A;  . PROSTATE SURGERY  12-06-12  . TONSILLECTOMY    . TOTAL KNEE ARTHROPLASTY Right 11/25/2014   Procedure: RIGHT TOTAL KNEE ARTHROPLASTY;  Surgeon: Melrose Nakayama, MD;  Location: [facility];  Service: Orthopedics;  Laterality: Right;        Home Medications    Prior to Admission medications   Medication Sig Start Date End Date Taking? Authorizing Provider  Cholecalciferol (VITAMIN D) 2000 UNITS tablet Take 2,000 Units by mouth daily.     [provider]  gabapentin (NEURONTIN)  400 MG capsule Take 400 mg by mouth 3 (three) times daily.     [provider]  omeprazole (PRILOSEC) 40 MG capsule Take 1 capsule (40 mg total) by mouth every morning. Before breakfast 08/01/18   Unk Pinto, MD  propranolol (INDERAL) 20 MG tablet Take 1 tablet (20 mg total) by mouth 2 (two) times daily. 08/01/18   Unk Pinto, MD  sertraline (ZOLOFT) 100 MG tablet 1 pill daily for mood Patient taking differently: Take 100 mg by mouth daily.  02/15/17 03/17/18  Vicie Mutters, PA-C  simvastatin (ZOCOR) 20  MG tablet Take 1 tablet (20 mg total) by mouth every evening. 08/01/18   Unk Pinto, MD    Family History Family History  Problem Relation Age of Onset  . CAD Father        Deceased, 40  . Dementia Mother        Deceased, 72  . Diabetes Brother   . Hyperlipidemia Brother   . Hypertension Brother   . Dementia Brother   . Colon cancer Neg Hx   . Colon polyps Neg Hx   . Kidney disease Neg Hx   . Esophageal cancer Neg Hx   . Gallbladder disease Neg Hx     Social History Social History   Tobacco Use  . Smoking status: Former Smoker    Packs/day: 0.50    Years: 30.00    Pack years: 15.00    Last attempt to quit: 06/14/1963    Years since quitting: 55.2  . Smokeless tobacco: Never Used  Substance Use Topics  . Alcohol use: Yes    Alcohol/week: 0.0 standard drinks    Comment: Socially  . Drug use: No     Allergies   Niacin; Penicillins; and Tramadol   Review of Systems Review of Systems  All other systems reviewed and are negative.    Physical Exam Updated Vital Signs BP (!) 127/49 (BP Location: Right Arm)   Pulse 66   Temp 98 F (36.7 C) (Oral)   Resp 14   Ht 6' (1.829 m)   Wt 77.1 kg   SpO2 98%   BMI 23.06 kg/m   Physical Exam Vitals signs and nursing note reviewed.    85 year old male, resting comfortably and in no acute distress. Vital signs are normal. Oxygen saturation is 98%, which is normal. Head is normocephalic and atraumatic. PERRLA, EOMI. Oropharynx is clear. Neck is nontender and supple without adenopathy or JVD.  There are no carotid bruits. Back is nontender and there is no CVA tenderness. Lungs are clear without rales, wheezes, or rhonchi. Chest is nontender. Heart has regular rate and rhythm without murmur. Abdomen is soft, flat, nontender without masses or hepatosplenomegaly and peristalsis is normoactive. Extremities have no cyanosis or edema, full range of motion is present. Skin is warm and dry without rash. Neurologic:  Mental status is normal, cranial nerves are intact.  Strength is 5/5 in both arms and both legs.  There is no pronator drift.  There is slight decreased sensation in the right side of his body including his right arm and the right side of his face, but there is no extinction on double simultaneous stimulation.  Finger-to-nose testing is normal.  ED Treatments / Results  Labs (all labs ordered are listed, but only abnormal results are displayed) Labs Reviewed  CBC - Abnormal; Notable for the following components:      Result Value   RBC 3.38 (*)    Hemoglobin 10.9 (*)  HCT 32.6 (*)    Platelets 114 (*)    All other components within normal limits  COMPREHENSIVE METABOLIC PANEL - Abnormal; Notable for the following components:   Glucose, Bld 107 (*)    BUN 24 (*)    Creatinine, Ser 1.58 (*)    Calcium 8.8 (*)    Total Protein 5.9 (*)    AST 13 (*)    GFR calc non Af Amer 39 (*)    GFR calc Af Amer 45 (*)    All other components within normal limits  ETHANOL  PROTIME-INR  APTT  DIFFERENTIAL  RAPID URINE DRUG SCREEN, HOSP PERFORMED  URINALYSIS, ROUTINE W REFLEX MICROSCOPIC    EKG EKG Interpretation  Date/Time:  Tuesday September 04 2018 22:50:18 EDT Ventricular Rate:  67 PR Interval:    QRS Duration: 144 QT Interval:  420 QTC Calculation: 444 R Axis:   4 Text Interpretation:  Sinus rhythm Right bundle branch block When compared with ECG of 03/17/2018, No significant change was found Confirmed by Delora Fuel ([id number]) on 09/04/2018 11:44:12 PM   Radiology Ct Head Wo Contrast  Result Date: 09/05/2018 CLINICAL DATA:  85 year old male with lower extremity tingling. EXAM: CT HEAD WITHOUT CONTRAST TECHNIQUE: Contiguous axial images were obtained from the base of the skull through the vertex without intravenous contrast. COMPARISON:  Head CT 01/18/2018. Brain MRI 10/21/2017 and earlier. FINDINGS: Brain: Stable cerebral volume. Stable ventricle size and configuration. No midline shift,  mass effect, or evidence of intracranial mass lesion. No acute intracranial hemorrhage identified. Chronic Patchy and confluent white matter hypodensity. Chronic heterogeneity in the pons, but more discrete linear hypodensity in the left central pons today on series 3, image 11. No cortically based acute infarct identified. Vascular: Calcified atherosclerosis at the skull base. No suspicious intracranial vascular hyperdensity. Skull: Stable visualized osseous structures. Sinuses/Orbits: Visualized paranasal sinuses and mastoids are stable and generally well pneumatized. Other: No acute orbit or scalp soft tissue finding. IMPRESSION: 1. Chronic small vessel disease with progression in the left pons since 2019. If acute this would be expected to cause right side symptoms. 2. No acute cortically based infarct or intracranial hemorrhage identified. Electronically Signed   By: Genevie Ann M.D.   On: 09/05/2018 00:13   Mr Brain Wo Contrast  Result Date: 09/05/2018 CLINICAL DATA:  Bilateral lower extremity tingling. Balance difficulty. EXAM: MRI HEAD WITHOUT CONTRAST TECHNIQUE: Multiplanar, multiecho pulse sequences of the brain and surrounding structures were obtained without intravenous contrast. COMPARISON:  Head CT 09/04/2018 Brain MRI 10/21/2017 FINDINGS: BRAIN: There is no acute infarct, acute hemorrhage or extra-axial collection. The midline structures are normal. No midline shift or other mass effect. Early confluent hyperintense T2-weighted signal of the periventricular and deep white matter, most commonly due to chronic ischemic microangiopathy. Generalized atrophy without lobar predilection. Remote hemorrhagic focus in the left frontal lobe and smaller foci in the occipital lobes, unchanged. No mass lesion. VASCULAR: The major intracranial arterial and venous sinus flow voids are normal. SKULL AND UPPER CERVICAL SPINE: Calvarial bone marrow signal is normal. There is no skull base mass. Visualized upper cervical  spine and soft tissues are normal. SINUSES/ORBITS: No fluid levels or advanced mucosal thickening. Small amount of right mastoid. The orbits are normal. IMPRESSION: 1. No acute abnormality. 2. Generalized volume loss and chronic microvascular ischemia, unchanged. Electronically Signed   By: Ulyses Jarred M.D.   On: 09/05/2018 03:04    Procedures Procedures   Medications Ordered in ED Medications - No data to  display   Initial Impression / Assessment and Plan / ED Course  I have reviewed the triage vital signs and the nursing notes.  Pertinent labs & imaging results that were available during my care of the patient were reviewed by me and considered in my medical decision making (see chart for details).  Right-sided numbness concerning for stroke.  He is outside of the window for thrombolytic therapy, and deficits are too mild to be considered for endovascular therapy.  Therefore, a code stroke was not activated.  Case was discussed with Dr. Lorraine Lax, who agreed with not activating code stroke.  Screening labs are obtained and he will be sent for CT of the head.  If negative, will need to proceed with MRI.  Old records reviewed, and he was seen in the ED last May with transient vision loss which was felt to be a TIA.  ECG showed right bundle branch block, unchanged from prior.  CT of head showed small vessel disease but unchanged from prior.  Labs showed renal insufficiency and, normochromic normocytic anemia, thrombocytopenia without significant change from prior.  He was sent for MRI of the brain which showed no evidence of acute infarct.  On reevaluation, he states that the numbness is completely resolved.  This may have been a transient ischemic attack, but he has been evaluated for this in the recent past and no further work-up is initiated.  I do not see aspirin on his medication list, so he is advised to take low-dose aspirin daily.  Follow-up with PCP.  Return precautions discussed.  Final  Clinical Impressions(s) / ED Diagnoses   Final diagnoses:  Paresthesia  Renal insufficiency  Normochromic normocytic anemia  Thrombocytopenia [facility])    ED Discharge Orders    None       Delora Fuel, MD 44/92/01 0400

## 2018-09-05 ENCOUNTER — Emergency Department (HOSPITAL_COMMUNITY): Payer: Medicare Other

## 2018-09-05 DIAGNOSIS — R200 Anesthesia of skin: Secondary | ICD-10-CM | POA: Diagnosis not present

## 2018-09-05 DIAGNOSIS — R202 Paresthesia of skin: Secondary | ICD-10-CM | POA: Diagnosis not present

## 2018-09-05 LAB — CBC
HCT: 32.6 % — ABNORMAL LOW (ref 39.0–52.0)
Hemoglobin: 10.9 g/dL — ABNORMAL LOW (ref 13.0–17.0)
MCH: 32.2 pg (ref 26.0–34.0)
MCHC: 33.4 g/dL (ref 30.0–36.0)
MCV: 96.4 fL (ref 80.0–100.0)
Platelets: 114 10*3/uL — ABNORMAL LOW (ref 150–400)
RBC: 3.38 MIL/uL — ABNORMAL LOW (ref 4.22–5.81)
RDW: 12.4 % (ref 11.5–15.5)
WBC: 6.6 10*3/uL (ref 4.0–10.5)
nRBC: 0 % (ref 0.0–0.2)

## 2018-09-05 LAB — DIFFERENTIAL
Abs Immature Granulocytes: 0.04 10*3/uL (ref 0.00–0.07)
Basophils Absolute: 0 10*3/uL (ref 0.0–0.1)
Basophils Relative: 0 %
Eosinophils Absolute: 0.1 10*3/uL (ref 0.0–0.5)
Eosinophils Relative: 2 %
Immature Granulocytes: 1 %
LYMPHS PCT: 18 %
Lymphs Abs: 1.2 10*3/uL (ref 0.7–4.0)
Monocytes Absolute: 0.6 10*3/uL (ref 0.1–1.0)
Monocytes Relative: 9 %
Neutro Abs: 4.7 10*3/uL (ref 1.7–7.7)
Neutrophils Relative %: 70 %

## 2018-09-05 LAB — COMPREHENSIVE METABOLIC PANEL
ALT: 11 U/L (ref 0–44)
AST: 13 U/L — ABNORMAL LOW (ref 15–41)
Albumin: 3.6 g/dL (ref 3.5–5.0)
Alkaline Phosphatase: 54 U/L (ref 38–126)
Anion gap: 7 (ref 5–15)
BUN: 24 mg/dL — ABNORMAL HIGH (ref 8–23)
CALCIUM: 8.8 mg/dL — AB (ref 8.9–10.3)
CO2: 26 mmol/L (ref 22–32)
CREATININE: 1.58 mg/dL — AB (ref 0.61–1.24)
Chloride: 109 mmol/L (ref 98–111)
GFR calc Af Amer: 45 mL/min — ABNORMAL LOW (ref >60)
GFR, EST NON AFRICAN AMERICAN: 39 mL/min — AB (ref >60)
Glucose, Bld: 107 mg/dL — ABNORMAL HIGH (ref 70–99)
Potassium: 4.2 mmol/L (ref 3.5–5.1)
Sodium: 142 mmol/L (ref 135–145)
Total Bilirubin: 0.5 mg/dL (ref 0.3–1.2)
Total Protein: 5.9 g/dL — ABNORMAL LOW (ref 6.5–8.1)

## 2018-09-05 LAB — PROTIME-INR
INR: 1.1 (ref 0.8–1.2)
PROTHROMBIN TIME: 14.1 s (ref 11.4–15.2)

## 2018-09-05 LAB — ETHANOL: Alcohol, Ethyl (B): <10 mg/dL (ref <10)

## 2018-09-05 LAB — APTT: aPTT: 28 seconds (ref 24–36)

## 2018-09-05 NOTE — ED Notes (Signed)
Patient verbalizes understanding of medications and discharge instructions. No further questions at this time. VSS and patient ambulatory at discharge.   

## 2018-09-05 NOTE — Discharge Instructions (Addendum)
Please make sure that you are taking aspirin 81 mg every day.  Return if you are having any further problems.

## 2019-01-10 ENCOUNTER — Other Ambulatory Visit: Payer: Self-pay | Admitting: *Deleted

## 2019-01-10 DIAGNOSIS — F3341 Major depressive disorder, recurrent, in partial remission: Secondary | ICD-10-CM

## 2019-01-10 DIAGNOSIS — G3184 Mild cognitive impairment, so stated: Secondary | ICD-10-CM

## 2019-01-10 MED ORDER — VITAMIN D 50 MCG (2000 UT) PO TABS: ORAL_TABLET | ORAL | 0 refills | Status: DC

## 2019-01-10 MED ORDER — SERTRALINE HCL 100 MG PO TABS: ORAL_TABLET | ORAL | 1 refills | Status: DC

## 2019-01-10 MED ORDER — GABAPENTIN 400 MG PO CAPS: ORAL_CAPSULE | ORAL | 1 refills | Status: DC

## 2019-01-10 MED ORDER — OMEPRAZOLE 40 MG PO CPDR: DELAYED_RELEASE_CAPSULE | ORAL | 1 refills | Status: DC

## 2019-01-10 MED ORDER — SIMVASTATIN 20 MG PO TABS: ORAL_TABLET | ORAL | 1 refills | Status: DC

## 2019-01-10 MED ORDER — PROPRANOLOL HCL 20 MG PO TABS: ORAL_TABLET | ORAL | 1 refills | Status: DC

## 2019-01-30 NOTE — Progress Notes (Deleted)
MEDICARE ANNUAL WELLNESS VISIT AND FOLLOW UP Assessment:    Medicare visits 1 year Over 30 minutes of exam, counseling, chart review, and critical decision making was performed  Essential hypertension, benign -well controlled -monitor at home -dash diet -exercise as tolerated - TSH   Atherosclerosis of native coronary artery of native heart without angina pectoris -cont dash diet -d/c statin due to age  MCI (mild cognitive impairment) -followed by neurology   Hereditary and idiopathic peripheral neuropathy -followed by neurology   Benign essential tremor -cont propranolol   Primary osteoarthritis of right knee -weight control -tylenol prn   Bladder neck obstruction -followed by urology   Malignant neoplasm of prostate ([place]) -followed by urology -s/p prostatectomy  Mixed hyperlipidemia - on statin - Lipid panel  INTERMITTENT VERTIGO -cont meds prn  Vitamin D deficiency -cont Vit D   Medication management - CBC with Differential/Platelet - CMP/GFR    Future Appointments  Date Time Provider [place]  01/31/2019 10:00 AM Liane Comber, NP [facility] None  09/10/2019  3:00 PM Unk Pinto, MD [facility] None     Plan:   During the course of the visit the patient was educated and counseled about appropriate screening and preventive services including:    Pneumococcal vaccine   Influenza vaccine  Prevnar 13  Td vaccine  Screening electrocardiogram  Colorectal cancer screening  Diabetes screening  Glaucoma screening  Nutrition counseling    Subjective:  Ryan Humphrey is a 85 y.o. male who presents for Medicare Annual Wellness Visit and 3 month follow up for HTN, hyperlipidemia, prediabetes, and vitamin D Def.   Patient is followed by Dr Posey Pronto for vascular Dementia, Lumbar Sinal Stenosis, peripheral sensory Neuropathy and AS Cerebrovascular Disease and Familial Tremor.  He has a history of prostate cancer s/p surgery in  2014.    On zoloft *** needs dx  BMI is There is no height or weight on file to calculate BMI., he {HAS HAS [id number]} been working on diet and exercise. Wt Readings from Last 3 Encounters:  09/04/18 170 lb (77.1 kg)  07/26/18 170 lb 3.2 oz (77.2 kg)  04/04/18 164 lb (74.4 kg)   His blood pressure has been controlled at home, today their BP is   He does not workout. He denies chest pain, shortness of breath, dizziness.   He is on cholesterol medication and denies myalgias. His cholesterol is at goal. The cholesterol last visit was:   Lab Results  Component Value Date   CHOL 154 07/26/2018   HDL 31 (L) 07/26/2018   LDLCALC 97 07/26/2018   TRIG 163 (H) 07/26/2018   CHOLHDL 5.0 (H) 07/26/2018   Patient does not have a history of diabetes or prediabetes, however he has neuropathy and is on gabapentin. Has seen neuro.  He continue to eat a healthy diet.   Lab Results  Component Value Date   HGBA1C 5.3 07/26/2018   Last GFR Lab Results  Component Value Date   GFRNONAA 39 (L) 09/04/2018    Patient is on Vitamin D supplement.   Lab Results  Component Value Date   VD25OH 31 07/26/2018     He has hx of B12 deficiency *** Lab Results  Component Value Date   VITAMINB12 299 07/26/2018     Medication Review: Current Outpatient Medications on File Prior to Visit  Medication Sig Dispense Refill  . Cholecalciferol (VITAMIN D) 50 MCG (2000 UT) tablet Take 1 pill daily. 360 tablet 0  . gabapentin (NEURONTIN) 400 MG capsule  Take 1 capsule 3 times a day for nerve pain. 270 capsule 1  . omeprazole (PRILOSEC) 40 MG capsule Takes 1 capsule daily for acid reflux. 90 capsule 1  . propranolol (INDERAL) 20 MG tablet Take 1 tablet 2 times a day for blood pressure. 180 tablet 1  . sertraline (ZOLOFT) 100 MG tablet 1 pill daily for mood 90 tablet 1  . simvastatin (ZOCOR) 20 MG tablet Take 1 tablet daily for cholesterol. 90 tablet 1   No current facility-administered medications on file prior  to visit.     Allergies: Allergies  Allergen Reactions  . Niacin Itching  . Penicillins Other (See Comments)    "rash"   . Tramadol Itching    Current Problems (verified) has Mixed hyperlipidemia; Essential hypertension, benign; CORONARY ATHEROSCLEROSIS NATIVE CORONARY ARTERY; INTERMITTENT VERTIGO; Primary osteoarthritis of right knee; Vitamin D deficiency; Medication management; MCI (mild cognitive impairment); Hereditary and idiopathic peripheral neuropathy; Benign essential tremor; Bladder neck obstruction; Malignant neoplasm of prostate ([place]); History of TIA (transient ischemic attack); Vitamin B12 deficiency; GERD (gastroesophageal reflux disease); and CKD (chronic kidney disease) stage 3, GFR 30-59 ml/min (HCC) on their problem list.  Screening Tests Immunization History  Administered Date(s) Administered  . Influenza, High Dose Seasonal PF 03/05/2015  . Influenza-Unspecified 04/22/2016, 03/13/2017  . Pneumococcal Conjugate-13 05/23/2017  . Pneumococcal Polysaccharide-23 01/20/2016    Preventative care: Last colonoscopy: 2014  Prior vaccinations: TD or Tdap: Declined Due to cost Influenza: Due in September  Pneumococcal: 2016 Prevnar13: 2018 Shingles/Zostavax: Declined  Names of Other Physician/Practitioners you currently use: 1. [facility] here for primary care 2. [facility], eye doctor, last visit 2016 3. [employer] [facility], dentist, last visit 2017  Patient Care Team: Unk Pinto, MD as PCP - General (Internal Medicine) Elouise Munroe, MD as PCP - Cardiology (Cardiology)  Surgical: He  has a past surgical history that includes Appendectomy; Cholecystectomy; Carpal tunnel release; Prostate surgery (12-06-12); cataract surgery (Left, 12-06-12); Esophagogastroduodenoscopy (egd) with propofol (N/A, 12/25/2012); Colonoscopy with propofol (N/A, 12/25/2012); Tonsillectomy; and Total knee arthroplasty (Right,  11/25/2014). Family His family history includes CAD in his father; Dementia in his brother and mother; Diabetes in his brother; Hyperlipidemia in his brother; Hypertension in his brother. Social history  He reports that he quit smoking about 55 years ago. He has a 15.00 pack-year smoking history. He has never used smokeless tobacco. He reports current alcohol use. He reports that he does not use drugs.  MEDICARE WELLNESS OBJECTIVES: Physical activity:   Cardiac risk factors:   Depression/mood screen:   Depression screen [facility] 2/9 07/26/2018  Decreased Interest 0  Down, Depressed, Hopeless 0  PHQ - 2 Score 0    ADLs:  In your present state of health, do you have any difficulty performing the following activities: 07/26/2018  Hearing? N  Vision? N  Difficulty concentrating or making decisions? N  Walking or climbing stairs? N  Dressing or bathing? N  Doing errands, shopping? N  Some recent data might be hidden    Archivist neuro  EOL planning:     Objective:   There were no vitals filed for this visit. There is no height or weight on file to calculate BMI.  General appearance: alert, no distress, WD/WN, male HEENT: normocephalic, sclerae anicteric, TMs pearly, nares patent, no discharge or erythema, pharynx normal Oral cavity: MMM, no lesions Neck: supple, no lymphadenopathy, no thyromegaly, no masses Heart: RRR, normal S1, S2, no murmurs Lungs: CTA  bilaterally, no wheezes, rhonchi, or rales Abdomen: +bs, soft, non tender, non distended, no masses, no hepatomegaly, no splenomegaly Musculoskeletal: nontender, no swelling, no obvious deformity Extremities: no edema, no cyanosis, no clubbing Pulses: 2+ symmetric, upper and lower extremities, normal cap refill Neurological: alert, oriented x 3, CN2-12 intact, strength normal upper extremities and decreased strength bilateral lower extremities, sensation normal throughout, DTRs 2+ throughout, no cerebellar signs,  gait slow and antalgic Psychiatric: normal affect, behavior normal, pleasant   Medicare Attestation I have personally reviewed: The patient's medical and social history Their use of alcohol, tobacco or illicit drugs Their current medications and supplements The patient's functional ability including ADLs,fall risks, home safety risks, cognitive, and hearing and visual impairment Diet and physical activities Evidence for depression or mood disorders  The patient's weight, height, BMI, and visual acuity have been recorded in the chart.  I have made referrals, counseling, and provided education to the patient based on review of the above and I have provided the patient with a written personalized care plan for preventive services.     Izora Ribas, NP   01/30/2019

## 2019-01-31 ENCOUNTER — Ambulatory Visit: Payer: Self-pay | Admitting: Adult Health

## 2019-02-13 NOTE — Progress Notes (Signed)
MEDICARE ANNUAL WELLNESS VISIT AND FOLLOW UP Assessment:    Medicare visits 1 year Over 30 minutes of exam, counseling, chart review, and critical decision making was performed  Essential hypertension, benign -well controlled -encouraged to monitor at home - call if any fatigue, dizziness -dash diet -exercise as tolerated - TSH   Atherosclerosis of native coronary artery of native heart without angina pectoris - Control blood pressure, cholesterol, glucose, increase exercise.   Dementia without behavioral disturbance -followed by neurology - ? Vascular vs alzheimer's -concern with medication adherence - recommended he start using pill box - memory compensation techniques reviewed and provided on AWV - Encouraged to bring meds/supplements with him next visit  - will plan to continue with zoloft    Hereditary and idiopathic peripheral neuropathy -followed by neurology   Benign essential tremor -cont propranolol   Primary osteoarthritis of right knee -weight control -tylenol prn   Malignant neoplasm of prostate ([place]) -s/p TURP x 2 -monitoring PSAs annually via our office and remains low/stable - denies urinary symptoms  Mixed hyperlipidemia - on statin - Lipid panel  INTERMITTENT VERTIGO -cont meds prn  Vitamin D deficiency -cont Vit D   Medication management - CBC with Differential/Platelet - CMP/GFR    Future Appointments  Date Time Provider [place]  09/10/2019  3:00 PM Unk Pinto, MD [facility] None  02/25/2020  3:00 PM Liane Comber, NP [facility] None     Plan:   During the course of the visit the patient was educated and counseled about appropriate screening and preventive services including:    Pneumococcal vaccine   Influenza vaccine  Prevnar 13  Td vaccine  Screening electrocardiogram  Colorectal cancer screening  Diabetes screening  Glaucoma screening  Nutrition counseling    Subjective:  Ryan Humphrey  is a 85 y.o. male who presents for Medicare Annual Wellness Visit and 3 month follow up for HTN, hyperlipidemia, prediabetes, and vitamin D Def.   He is now living at [facility], they drive if needed. Meals are provided by them.  He is able to clean his room himself and complete ADLs. Daughter pays bills.  He manages his own medications. Rarely forgets to take per patient. ? Reliability Incorporates with AM and PM routines.   Of note, per neuro note 03/13/2018 he admitted wasn't taking medications at all.  Patient is followed by Dr Posey Pronto for Dementia - ? Vascular vs more recently was suggested possible Alzheimer's by neuropsych  Also followed for Lumbar Sinal Stenosis, peripheral sensory Neuropathy and AS Cerebrovascular Disease and Familial Tremor.  He had ? TIA in 08/2018, CT and MRI showed no changes, RLE parasthesias resolved spontaneously, was advised restarted on bASA  He has been on zoloft for many years per patient. Can't recall why originally started. Denies depressed mood, anxiety today.   BMI is Body mass index is 23.19 kg/m., he has not been working on diet and exercise. Does walk some around compound Wt Readings from Last 3 Encounters:  02/14/19 171 lb (77.6 kg)  09/04/18 170 lb (77.1 kg)  07/26/18 170 lb 3.2 oz (77.2 kg)   He has not been checking BPs at home, today their BP is BP: (!) 118/56 He does not workout. He denies chest pain, shortness of breath, dizziness.   He is on cholesterol medication and denies myalgias. His cholesterol is at goal. The cholesterol last visit was:   Lab Results  Component Value Date   CHOL 154 07/26/2018   HDL 31 (L)  07/26/2018   LDLCALC 97 07/26/2018   TRIG 163 (H) 07/26/2018   CHOLHDL 5.0 (H) 07/26/2018   Patient does not have a history of diabetes or prediabetes, however he has neuropathy and is on gabapentin. Has seen neuro.  He continue to eat a healthy diet.   Lab Results  Component Value Date   HGBA1C 5.3  07/26/2018   Last GFR Lab Results  Component Value Date   GFRNONAA 39 (L) 09/04/2018    Patient is on Vitamin D supplement.   Lab Results  Component Value Date   VD25OH 31 07/26/2018     He has hx of B12 deficiency, reports has been taking daily B12 for years;  Lab Results  Component Value Date   VITAMINB12 299 07/26/2018   He has a history of prostate cancer s/p TURP, monitoring PSAs annually via our office which have been stable, denies any urinary symptoms. Lab Results  Component Value Date   PSA 1.1 07/26/2018   PSA 1.2 05/23/2017   PSA 1.0 04/22/2016     Medication Review: Current Outpatient Medications on File Prior to Visit  Medication Sig Dispense Refill  . Cholecalciferol (VITAMIN D) 50 MCG (2000 UT) tablet Take 1 pill daily. 360 tablet 0  . gabapentin (NEURONTIN) 400 MG capsule Take 1 capsule 3 times a day for nerve pain. 270 capsule 1  . omeprazole (PRILOSEC) 40 MG capsule Takes 1 capsule daily for acid reflux. 90 capsule 1  . propranolol (INDERAL) 20 MG tablet Take 1 tablet 2 times a day for blood pressure. 180 tablet 1  . sertraline (ZOLOFT) 100 MG tablet 1 pill daily for mood 90 tablet 1  . simvastatin (ZOCOR) 20 MG tablet Take 1 tablet daily for cholesterol. 90 tablet 1   No current facility-administered medications on file prior to visit.     Allergies: Allergies  Allergen Reactions  . Niacin Itching  . Penicillins Other (See Comments)    "rash"   . Tramadol Itching    Current Problems (verified) has Mixed hyperlipidemia; Essential hypertension, benign; CORONARY ATHEROSCLEROSIS NATIVE CORONARY ARTERY; INTERMITTENT VERTIGO; Primary osteoarthritis of right knee; Vitamin D deficiency; Medication management; Dementia ([place]); Hereditary and idiopathic peripheral neuropathy; Benign essential tremor; Malignant neoplasm of prostate ([place]); History of TIA (transient ischemic attack); Vitamin B12 deficiency; GERD (gastroesophageal reflux disease); and CKD  (chronic kidney disease) stage 3, GFR 30-59 ml/min (HCC) on their problem list.  Screening Tests Immunization History  Administered Date(s) Administered  . Influenza, High Dose Seasonal PF 03/05/2015  . Influenza-Unspecified 04/22/2016, 03/13/2017  . Pneumococcal Conjugate-13 05/23/2017  . Pneumococcal Polysaccharide-23 01/20/2016    Preventative care: Last colonoscopy: 2014, DONE  CXR: 03/2018  Prior vaccinations: TD or Tdap: Declined Due to cost Influenza: Due - out of stock today  Pneumococcal: 2016 Prevnar13: 2018 Shingles/Zostavax: Declined  Names of Other Physician/Practitioners you currently use: 1. [facility] here for primary care 2. [facility], eye doctor, last visit 2016, plans to follow up with the [facility] 3. [facility], dentist, last visit 2019  Patient Care Team: Unk Pinto, MD as PCP - General (Internal Medicine) Elouise Munroe, MD as PCP - Cardiology (Cardiology)  Surgical: He  has a past surgical history that includes Appendectomy; Cholecystectomy; Carpal tunnel release; Prostate surgery (12-06-12); cataract surgery (Left, 12-06-12); Esophagogastroduodenoscopy (egd) with propofol (N/A, 12/25/2012); Colonoscopy with propofol (N/A, 12/25/2012); Tonsillectomy; and Total knee arthroplasty (Right, 11/25/2014). Family His family history includes CAD in his father; Dementia in his brother  and mother; Diabetes in his brother; Hyperlipidemia in his brother; Hypertension in his brother. Social history  He reports that he quit smoking about 55 years ago. He has a 15.00 pack-year smoking history. He has never used smokeless tobacco. He reports current alcohol use. He reports that he does not use drugs.  MEDICARE WELLNESS OBJECTIVES: Physical activity: Current Exercise Habits: Home exercise routine, Type of exercise: walking, Time (Minutes): 60, Frequency (Times/Week): 7, Weekly Exercise (Minutes/Week): 420,  Intensity: Mild, Exercise limited by: None identified Cardiac risk factors: Cardiac Risk Factors include: advanced age (>46men, >43 women);dyslipidemia;hypertension;male gender;smoking/ tobacco exposure Depression/mood screen:   Depression screen [facility] 2/9 02/14/2019  Decreased Interest 0  Down, Depressed, Hopeless 0  PHQ - 2 Score 0    ADLs:  In your present state of health, do you have any difficulty performing the following activities: 02/14/2019 07/26/2018  Hearing? N N  Comment has hearing aids if needed, hasn't recently -  Vision? N N  Difficulty concentrating or making decisions? Y N  Comment followed by Dr. Posey Pronto for vascular dementia; forgetfult -  Walking or climbing stairs? N N  Comment uses cane if needed -  Dressing or bathing? N N  Doing errands, shopping? Y N  Comment driven by [facility] -  Conservation officer, nature and eating ? N -  Using the Toilet? N -  In the past six months, have you accidently leaked urine? N -  Do you have problems with loss of bowel control? N -  Managing your Medications? N -  Managing your Finances? N -  Comment daughter manages -  Housekeeping or managing your Housekeeping? N -  Some recent data might be hidden    Cognitive Testing  Follows neuro  Word recall 0/3 today - hearing impaired by mask may be contributing  EOL planning: Does Patient Have a Medical Advance Directive?: No Would patient like information on creating a medical advance directive?: Yes (MAU/Ambulatory/Procedural Areas - Information given)   Objective:   Today's Vitals   02/14/19 1446 02/14/19 1510  BP: (!) 100/50 (!) 118/56  Pulse: 64   Temp: (!) 97.5 F (36.4 C)   SpO2: 97%   Weight: 171 lb (77.6 kg)    Body mass index is 23.19 kg/m.  General appearance: alert, no distress, WD/WN, male HEENT: normocephalic, sclerae anicteric, TMs pearly, nares patent, no discharge or erythema, pharynx normal, HOH  Oral cavity: MMM, no lesions Neck: supple, no  lymphadenopathy, no thyromegaly, no masses Heart: RRR, normal S1, S2, no murmurs Lungs: CTA bilaterally, no wheezes, rhonchi, or rales Abdomen: +bs, soft, non tender, non distended, no masses, no hepatomegaly, no splenomegaly Musculoskeletal: nontender, no swelling, no obvious deformity Extremities: no edema, no cyanosis, no clubbing Pulses: 2+ symmetric, upper and lower extremities, normal cap refill Neurological: alert, oriented x 3, CN2-12 intact, strength normal upper extremities and decreased strength bilateral lower extremities, sensation normal throughout, DTRs 2+ throughout, no cerebellar signs, gait slow but steady with 4 point cane Psychiatric: normal affect, behavior normal, pleasant   Medicare Attestation I have personally reviewed: The patient's medical and social history Their use of alcohol, tobacco or illicit drugs Their current medications and supplements The patient's functional ability including ADLs,fall risks, home safety risks, cognitive, and hearing and visual impairment Diet and physical activities Evidence for depression or mood disorders  The patient's weight, height, BMI, and visual acuity have been recorded in the chart.  I have made referrals, counseling, and provided education to the patient based on  review of the above and I have provided the patient with a written personalized care plan for preventive services.     Izora Ribas, NP   02/14/2019

## 2019-02-14 ENCOUNTER — Encounter: Payer: Self-pay | Admitting: Adult Health

## 2019-02-14 ENCOUNTER — Ambulatory Visit (INDEPENDENT_AMBULATORY_CARE_PROVIDER_SITE_OTHER): Payer: Medicare Other | Admitting: Adult Health

## 2019-02-14 ENCOUNTER — Other Ambulatory Visit: Payer: Self-pay

## 2019-02-14 VITALS — BP 118/56 | HR 64 | Temp 97.5°F | Wt 171.0 lb

## 2019-02-14 DIAGNOSIS — F0390 Unspecified dementia without behavioral disturbance: Secondary | ICD-10-CM | POA: Diagnosis not present

## 2019-02-14 DIAGNOSIS — M1711 Unilateral primary osteoarthritis, right knee: Secondary | ICD-10-CM

## 2019-02-14 DIAGNOSIS — N320 Bladder-neck obstruction: Secondary | ICD-10-CM

## 2019-02-14 DIAGNOSIS — Z0000 Encounter for general adult medical examination without abnormal findings: Secondary | ICD-10-CM

## 2019-02-14 DIAGNOSIS — G250 Essential tremor: Secondary | ICD-10-CM | POA: Diagnosis not present

## 2019-02-14 DIAGNOSIS — I10 Essential (primary) hypertension: Secondary | ICD-10-CM

## 2019-02-14 DIAGNOSIS — N183 Chronic kidney disease, stage 3 unspecified: Secondary | ICD-10-CM

## 2019-02-14 DIAGNOSIS — I2510 Atherosclerotic heart disease of native coronary artery without angina pectoris: Secondary | ICD-10-CM | POA: Diagnosis not present

## 2019-02-14 DIAGNOSIS — E559 Vitamin D deficiency, unspecified: Secondary | ICD-10-CM | POA: Diagnosis not present

## 2019-02-14 DIAGNOSIS — Z79899 Other long term (current) drug therapy: Secondary | ICD-10-CM | POA: Diagnosis not present

## 2019-02-14 DIAGNOSIS — K219 Gastro-esophageal reflux disease without esophagitis: Secondary | ICD-10-CM | POA: Diagnosis not present

## 2019-02-14 DIAGNOSIS — E538 Deficiency of other specified B group vitamins: Secondary | ICD-10-CM

## 2019-02-14 DIAGNOSIS — E782 Mixed hyperlipidemia: Secondary | ICD-10-CM

## 2019-02-14 DIAGNOSIS — Z8673 Personal history of transient ischemic attack (TIA), and cerebral infarction without residual deficits: Secondary | ICD-10-CM

## 2019-02-14 DIAGNOSIS — G609 Hereditary and idiopathic neuropathy, unspecified: Secondary | ICD-10-CM

## 2019-02-14 DIAGNOSIS — R6889 Other general symptoms and signs: Secondary | ICD-10-CM | POA: Diagnosis not present

## 2019-02-14 DIAGNOSIS — C61 Malignant neoplasm of prostate: Secondary | ICD-10-CM | POA: Diagnosis not present

## 2019-02-14 DIAGNOSIS — Z0001 Encounter for general adult medical examination with abnormal findings: Secondary | ICD-10-CM

## 2019-02-14 DIAGNOSIS — R42 Dizziness and giddiness: Secondary | ICD-10-CM | POA: Diagnosis not present

## 2019-02-14 DIAGNOSIS — Z6823 Body mass index (BMI) 23.0-23.9, adult: Secondary | ICD-10-CM

## 2019-02-14 NOTE — Patient Instructions (Addendum)
Mr. Durel , Thank you for taking time to come for your Medicare Wellness Visit. I appreciate your ongoing commitment to your health goals. Please review the following plan we discussed and let me know if I can assist you in the future.    This is a list of the screening recommended for you and due dates:  Health Maintenance  Topic Date Due  . Flu Shot  03/14/2019*  . Tetanus Vaccine  02/14/2020*  . Urine Protein Check  07/27/2019  . Pneumonia vaccines  Completed  *Topic was postponed. The date shown is not the original due date.     Please make sure you get a flu vaccine - best time is the first two weeks of October - call our office for a nurse visit if needed  Please schedule an eye exam as soon as you are able/they are open   Please restart on a daily aspirin - recommend a baby aspirin - 81 mg daily   Please bring all your medications and supplements to the next visit  Please make sure you are taking B12 supplement daily - if swallowing a tablet - please switch to sublingual (dissolves under tongue)  Consider using a pill box to make sure you aren't forgetting to take your medications      Please monitor your blood pressure. If it is getting below 130/80 AND you are having fatigue with exertion, dizziness we may need to cut your blood pressure medication in half. Please call the office if this is happening.  Hypotension As your heart beats, it forces blood through your body. This force is called blood pressure. If you have hypotension, you have low blood pressure. When your blood pressure is too low, you may not get enough blood to your brain. You may feel weak, feel lightheaded, have a fast heartbeat, or even pass out (faint). HOME CARE  Drink enough fluids to keep your pee (urine) clear or pale yellow.  Take all medicines as told by your doctor.  Get up slowly after sitting or lying down.  Wear support stockings as told by your doctor.  Maintain a healthy diet by  including foods such as fruits, vegetables, nuts, whole grains, and lean meats. GET HELP IF:  You are throwing up (vomiting) or have watery poop (diarrhea).  You have a fever for more than 2-3 days.  You feel more thirsty than usual.  You feel weak and tired. GET HELP RIGHT AWAY IF:   You pass out (faint).  You have chest pain or a fast or irregular heartbeat.  You lose feeling in part of your body.  You cannot move your arms or legs.  You have trouble speaking.  You get sweaty or feel lightheaded. MAKE SURE YOU:   Understand these instructions.  Will watch your condition.  Will get help right away if you are not doing well or get worse. Document Released: 08/24/2009 Document Revised: 01/30/2013 Document Reviewed: 11/30/2012 [facility] Patient Information 2015 [place], [place]. This information is not intended to replace advice given to you by your health care provider. Make sure you discuss any questions you have with your health care provider.     Memory Compensation Strategies  1. Use "WARM" strategy.  W= write it down  A= associate it  R= repeat it  M= make a mental note  2.   You can keep a Social worker.  Use a 3-ring notebook with sections for the following: calendar, important names and phone numbers,  medications,  doctors' names/phone numbers, lists/reminders, and a section to journal what you did  each day.   3.    Use a calendar to write appointments down.  4.    Write yourself a schedule for the day.  This can be placed on the calendar or in a separate section of the Memory Notebook.  Keeping a  regular schedule can help memory.  5.    Use medication organizer with sections for each day or morning/evening pills.  You may need help loading it  6.    Keep a basket, or pegboard by the door.  Place items that you need to take out with you in the basket or on the pegboard.  You may also want to  include a message board for reminders.  7.    Use sticky  notes.  Place sticky notes with reminders in a place where the task is performed.  For example: " turn off the  stove" placed by the stove, "lock the door" placed on the door at eye level, " take your medications" on  the bathroom mirror or by the place where you normally take your medications.  8.    Use alarms/timers.  Use while cooking to remind yourself to check on food or as a reminder to take your medicine, or as a  reminder to make a call, or as a reminder to perform another task, etc.

## 2019-02-15 LAB — CBC WITH DIFFERENTIAL/PLATELET
Absolute Monocytes: 601 cells/uL (ref 200–950)
Basophils Absolute: 33 cells/uL (ref 0–200)
Basophils Relative: 0.5 %
Eosinophils Absolute: 119 cells/uL (ref 15–500)
Eosinophils Relative: 1.8 %
HCT: 36.4 % — ABNORMAL LOW (ref 38.5–50.0)
Hemoglobin: 12.2 g/dL — ABNORMAL LOW (ref 13.2–17.1)
Lymphs Abs: 990 cells/uL (ref 850–3900)
MCH: 31.5 pg (ref 27.0–33.0)
MCHC: 33.5 g/dL (ref 32.0–36.0)
MCV: 94.1 fL (ref 80.0–100.0)
MPV: 10.9 fL (ref 7.5–12.5)
Monocytes Relative: 9.1 %
Neutro Abs: 4858 cells/uL (ref 1500–7800)
Neutrophils Relative %: 73.6 %
Platelets: 128 10*3/uL — ABNORMAL LOW (ref 140–400)
RBC: 3.87 10*6/uL — ABNORMAL LOW (ref 4.20–5.80)
RDW: 13.1 % (ref 11.0–15.0)
Total Lymphocyte: 15 %
WBC: 6.6 10*3/uL (ref 3.8–10.8)

## 2019-02-15 LAB — VITAMIN D 25 HYDROXY (VIT D DEFICIENCY, FRACTURES): Vit D, 25-Hydroxy: 54 ng/mL (ref 30–100)

## 2019-02-15 LAB — COMPLETE METABOLIC PANEL WITH GFR
AG Ratio: 2.1 (calc) (ref 1.0–2.5)
ALT: 9 U/L (ref 9–46)
AST: 11 U/L (ref 10–35)
Albumin: 4.1 g/dL (ref 3.6–5.1)
Alkaline phosphatase (APISO): 67 U/L (ref 35–144)
BUN/Creatinine Ratio: 16 (calc) (ref 6–22)
BUN: 28 mg/dL — ABNORMAL HIGH (ref 7–25)
CO2: 29 mmol/L (ref 20–32)
Calcium: 9.1 mg/dL (ref 8.6–10.3)
Chloride: 108 mmol/L (ref 98–110)
Creat: 1.73 mg/dL — ABNORMAL HIGH (ref 0.70–1.11)
GFR, Est African American: 40 mL/min/{1.73_m2} — ABNORMAL LOW (ref >=60)
GFR, Est Non African American: 35 mL/min/{1.73_m2} — ABNORMAL LOW (ref >=60)
Globulin: 2 g/dL (calc) (ref 1.9–3.7)
Glucose, Bld: 118 mg/dL — ABNORMAL HIGH (ref 65–99)
Potassium: 5.1 mmol/L (ref 3.5–5.3)
Sodium: 143 mmol/L (ref 135–146)
Total Bilirubin: 0.6 mg/dL (ref 0.2–1.2)
Total Protein: 6.1 g/dL (ref 6.1–8.1)

## 2019-02-15 LAB — LIPID PANEL
Cholesterol: 147 mg/dL (ref <200)
HDL: 27 mg/dL — ABNORMAL LOW (ref >=40)
LDL Cholesterol (Calc): 91 mg/dL (calc)
Non-HDL Cholesterol (Calc): 120 mg/dL (calc) (ref <130)
Total CHOL/HDL Ratio: 5.4 (calc) — ABNORMAL HIGH (ref <5.0)
Triglycerides: 196 mg/dL — ABNORMAL HIGH (ref <150)

## 2019-02-15 LAB — TSH: TSH: 2.91 mIU/L (ref 0.40–4.50)

## 2019-02-15 LAB — MAGNESIUM: Magnesium: 2 mg/dL (ref 1.5–2.5)

## 2019-03-06 DIAGNOSIS — R41841 Cognitive communication deficit: Secondary | ICD-10-CM | POA: Diagnosis not present

## 2019-03-12 DIAGNOSIS — R41841 Cognitive communication deficit: Secondary | ICD-10-CM | POA: Diagnosis not present

## 2019-03-14 DIAGNOSIS — R41841 Cognitive communication deficit: Secondary | ICD-10-CM | POA: Diagnosis not present

## 2019-03-14 DIAGNOSIS — M6281 Muscle weakness (generalized): Secondary | ICD-10-CM | POA: Diagnosis not present

## 2019-03-14 DIAGNOSIS — R2681 Unsteadiness on feet: Secondary | ICD-10-CM | POA: Diagnosis not present

## 2019-03-18 ENCOUNTER — Other Ambulatory Visit: Payer: Self-pay

## 2019-03-18 MED ORDER — GABAPENTIN 400 MG PO CAPS: ORAL_CAPSULE | ORAL | 1 refills | Status: DC

## 2019-03-19 DIAGNOSIS — R2681 Unsteadiness on feet: Secondary | ICD-10-CM | POA: Diagnosis not present

## 2019-03-19 DIAGNOSIS — M6281 Muscle weakness (generalized): Secondary | ICD-10-CM | POA: Diagnosis not present

## 2019-03-19 DIAGNOSIS — R41841 Cognitive communication deficit: Secondary | ICD-10-CM | POA: Diagnosis not present

## 2019-03-21 DIAGNOSIS — M6281 Muscle weakness (generalized): Secondary | ICD-10-CM | POA: Diagnosis not present

## 2019-03-21 DIAGNOSIS — R2681 Unsteadiness on feet: Secondary | ICD-10-CM | POA: Diagnosis not present

## 2019-03-21 DIAGNOSIS — R41841 Cognitive communication deficit: Secondary | ICD-10-CM | POA: Diagnosis not present

## 2019-03-26 ENCOUNTER — Other Ambulatory Visit: Payer: Self-pay

## 2019-03-26 DIAGNOSIS — R2681 Unsteadiness on feet: Secondary | ICD-10-CM | POA: Diagnosis not present

## 2019-03-26 DIAGNOSIS — R41841 Cognitive communication deficit: Secondary | ICD-10-CM | POA: Diagnosis not present

## 2019-03-26 DIAGNOSIS — M6281 Muscle weakness (generalized): Secondary | ICD-10-CM | POA: Diagnosis not present

## 2019-03-27 ENCOUNTER — Other Ambulatory Visit: Payer: Self-pay | Admitting: *Deleted

## 2019-03-27 NOTE — Patient Outreach (Signed)
[facility]) Care Management  03/27/2019  Ryan Humphrey [birth year] PO:[id number]   Telephone Screen  Referral Date:  03/26/2019 Referral Source:  EMMI Prevent Call Reason for Referral:  Screening Insurance:  Medicare   Outreach Attempt:  Outreach attempt #1 to patient for telephone screening.  Patient answered but stated he could not hear introduction.  RN Health Coach attempted another outreach with different call device.  Again patient answered and stated he could not hear on the line.    Plan:  RN Health Coach will attempt another telephone outreach to patient within the next 3 business days.   Ryan Humphrey [phone] Ryan Humphrey

## 2019-03-28 ENCOUNTER — Other Ambulatory Visit: Payer: Self-pay | Admitting: *Deleted

## 2019-03-28 NOTE — Patient Outreach (Signed)
[facility]) Care Management  03/28/2019  Ryan Humphrey [birth year] PO:[id number]   Telephone Screen  Referral Date:  03/26/2019 Referral Source:  EMMI Prevent Call Reason for Referral:  Screening Insurance:  Medicare   Outreach Attempt:  Outreach attempt #2 to patient for telephone screening. No answer. RN Health Coach left HIPAA compliant voicemail message along with contact information.  Plan:  RN Health Coach will send patient Unsuccessful Outreach Letter.  RN Health Coach will make another outreach attempt within the next 4 business days if no return call back from message left.  [place] [phone] [email or web address]

## 2019-04-01 ENCOUNTER — Other Ambulatory Visit: Payer: Self-pay | Admitting: *Deleted

## 2019-04-01 NOTE — Patient Outreach (Signed)
[place] [facility]) Care Management  04/01/2019  Ryan Humphrey [birth year] PO:[id number]   Telephone Screen  Referral Date:03/26/2019 Referral Source:EMMI Prevent Call Reason for Referral:Screening Insurance:Medicare   Outreach Attempt:  Successful telephone outreach to patient for telephone screening.  HIPAA verified with patient.  RN Health Coach introduced self and role.  [facility] services reviewed and discussed.  Patient stating he lives at [facility] and that they provide all of the same services.  States that at this time he declines [facility] screening and services due to [facility] providing the same.  Agrees to Successful Letter with pamphlet and encouraged to contact [facility] in the future if needs arise.  Plan:  RN Health Coach will send Successful Letter with [facility] Pamphlet.  RN Health Coach will close case and make patient inactive due to patient declining services.  [place] [phone] [email or web address]

## 2019-04-02 DIAGNOSIS — R2681 Unsteadiness on feet: Secondary | ICD-10-CM | POA: Diagnosis not present

## 2019-04-02 DIAGNOSIS — R41841 Cognitive communication deficit: Secondary | ICD-10-CM | POA: Diagnosis not present

## 2019-04-02 DIAGNOSIS — M6281 Muscle weakness (generalized): Secondary | ICD-10-CM | POA: Diagnosis not present

## 2019-04-04 DIAGNOSIS — M6281 Muscle weakness (generalized): Secondary | ICD-10-CM | POA: Diagnosis not present

## 2019-04-04 DIAGNOSIS — R41841 Cognitive communication deficit: Secondary | ICD-10-CM | POA: Diagnosis not present

## 2019-04-04 DIAGNOSIS — R2681 Unsteadiness on feet: Secondary | ICD-10-CM | POA: Diagnosis not present

## 2019-04-08 DIAGNOSIS — R2681 Unsteadiness on feet: Secondary | ICD-10-CM | POA: Diagnosis not present

## 2019-04-08 DIAGNOSIS — R41841 Cognitive communication deficit: Secondary | ICD-10-CM | POA: Diagnosis not present

## 2019-04-08 DIAGNOSIS — M6281 Muscle weakness (generalized): Secondary | ICD-10-CM | POA: Diagnosis not present

## 2019-04-09 DIAGNOSIS — R2681 Unsteadiness on feet: Secondary | ICD-10-CM | POA: Diagnosis not present

## 2019-04-09 DIAGNOSIS — M6281 Muscle weakness (generalized): Secondary | ICD-10-CM | POA: Diagnosis not present

## 2019-04-09 DIAGNOSIS — R41841 Cognitive communication deficit: Secondary | ICD-10-CM | POA: Diagnosis not present

## 2019-04-10 DIAGNOSIS — R2681 Unsteadiness on feet: Secondary | ICD-10-CM | POA: Diagnosis not present

## 2019-04-10 DIAGNOSIS — R41841 Cognitive communication deficit: Secondary | ICD-10-CM | POA: Diagnosis not present

## 2019-04-10 DIAGNOSIS — M6281 Muscle weakness (generalized): Secondary | ICD-10-CM | POA: Diagnosis not present

## 2019-04-12 DIAGNOSIS — R41841 Cognitive communication deficit: Secondary | ICD-10-CM | POA: Diagnosis not present

## 2019-04-12 DIAGNOSIS — R2681 Unsteadiness on feet: Secondary | ICD-10-CM | POA: Diagnosis not present

## 2019-04-12 DIAGNOSIS — M6281 Muscle weakness (generalized): Secondary | ICD-10-CM | POA: Diagnosis not present

## 2019-04-15 DIAGNOSIS — M6281 Muscle weakness (generalized): Secondary | ICD-10-CM | POA: Diagnosis not present

## 2019-04-15 DIAGNOSIS — R2681 Unsteadiness on feet: Secondary | ICD-10-CM | POA: Diagnosis not present

## 2019-04-15 DIAGNOSIS — R41841 Cognitive communication deficit: Secondary | ICD-10-CM | POA: Diagnosis not present

## 2019-04-16 DIAGNOSIS — R2681 Unsteadiness on feet: Secondary | ICD-10-CM | POA: Diagnosis not present

## 2019-04-16 DIAGNOSIS — R41841 Cognitive communication deficit: Secondary | ICD-10-CM | POA: Diagnosis not present

## 2019-04-16 DIAGNOSIS — M6281 Muscle weakness (generalized): Secondary | ICD-10-CM | POA: Diagnosis not present

## 2019-04-17 DIAGNOSIS — R41841 Cognitive communication deficit: Secondary | ICD-10-CM | POA: Diagnosis not present

## 2019-04-17 DIAGNOSIS — R2681 Unsteadiness on feet: Secondary | ICD-10-CM | POA: Diagnosis not present

## 2019-04-17 DIAGNOSIS — M6281 Muscle weakness (generalized): Secondary | ICD-10-CM | POA: Diagnosis not present

## 2019-04-18 DIAGNOSIS — M6281 Muscle weakness (generalized): Secondary | ICD-10-CM | POA: Diagnosis not present

## 2019-04-18 DIAGNOSIS — R41841 Cognitive communication deficit: Secondary | ICD-10-CM | POA: Diagnosis not present

## 2019-04-18 DIAGNOSIS — R2681 Unsteadiness on feet: Secondary | ICD-10-CM | POA: Diagnosis not present

## 2019-04-19 DIAGNOSIS — R41841 Cognitive communication deficit: Secondary | ICD-10-CM | POA: Diagnosis not present

## 2019-04-19 DIAGNOSIS — R2681 Unsteadiness on feet: Secondary | ICD-10-CM | POA: Diagnosis not present

## 2019-04-19 DIAGNOSIS — M6281 Muscle weakness (generalized): Secondary | ICD-10-CM | POA: Diagnosis not present

## 2019-04-22 DIAGNOSIS — R2681 Unsteadiness on feet: Secondary | ICD-10-CM | POA: Diagnosis not present

## 2019-04-22 DIAGNOSIS — R41841 Cognitive communication deficit: Secondary | ICD-10-CM | POA: Diagnosis not present

## 2019-04-22 DIAGNOSIS — M6281 Muscle weakness (generalized): Secondary | ICD-10-CM | POA: Diagnosis not present

## 2019-04-23 DIAGNOSIS — R2681 Unsteadiness on feet: Secondary | ICD-10-CM | POA: Diagnosis not present

## 2019-04-23 DIAGNOSIS — M6281 Muscle weakness (generalized): Secondary | ICD-10-CM | POA: Diagnosis not present

## 2019-04-23 DIAGNOSIS — R41841 Cognitive communication deficit: Secondary | ICD-10-CM | POA: Diagnosis not present

## 2019-04-24 DIAGNOSIS — R41841 Cognitive communication deficit: Secondary | ICD-10-CM | POA: Diagnosis not present

## 2019-04-24 DIAGNOSIS — M6281 Muscle weakness (generalized): Secondary | ICD-10-CM | POA: Diagnosis not present

## 2019-04-24 DIAGNOSIS — R2681 Unsteadiness on feet: Secondary | ICD-10-CM | POA: Diagnosis not present

## 2019-04-25 DIAGNOSIS — M6281 Muscle weakness (generalized): Secondary | ICD-10-CM | POA: Diagnosis not present

## 2019-04-25 DIAGNOSIS — R41841 Cognitive communication deficit: Secondary | ICD-10-CM | POA: Diagnosis not present

## 2019-04-25 DIAGNOSIS — R2681 Unsteadiness on feet: Secondary | ICD-10-CM | POA: Diagnosis not present

## 2019-04-26 DIAGNOSIS — R2681 Unsteadiness on feet: Secondary | ICD-10-CM | POA: Diagnosis not present

## 2019-04-26 DIAGNOSIS — M6281 Muscle weakness (generalized): Secondary | ICD-10-CM | POA: Diagnosis not present

## 2019-04-26 DIAGNOSIS — R41841 Cognitive communication deficit: Secondary | ICD-10-CM | POA: Diagnosis not present

## 2019-04-29 DIAGNOSIS — R41841 Cognitive communication deficit: Secondary | ICD-10-CM | POA: Diagnosis not present

## 2019-04-29 DIAGNOSIS — R2681 Unsteadiness on feet: Secondary | ICD-10-CM | POA: Diagnosis not present

## 2019-04-29 DIAGNOSIS — M6281 Muscle weakness (generalized): Secondary | ICD-10-CM | POA: Diagnosis not present

## 2019-04-30 DIAGNOSIS — R41841 Cognitive communication deficit: Secondary | ICD-10-CM | POA: Diagnosis not present

## 2019-04-30 DIAGNOSIS — R2681 Unsteadiness on feet: Secondary | ICD-10-CM | POA: Diagnosis not present

## 2019-04-30 DIAGNOSIS — M6281 Muscle weakness (generalized): Secondary | ICD-10-CM | POA: Diagnosis not present

## 2019-05-01 DIAGNOSIS — R41841 Cognitive communication deficit: Secondary | ICD-10-CM | POA: Diagnosis not present

## 2019-05-01 DIAGNOSIS — R2681 Unsteadiness on feet: Secondary | ICD-10-CM | POA: Diagnosis not present

## 2019-05-01 DIAGNOSIS — M6281 Muscle weakness (generalized): Secondary | ICD-10-CM | POA: Diagnosis not present

## 2019-05-02 DIAGNOSIS — M6281 Muscle weakness (generalized): Secondary | ICD-10-CM | POA: Diagnosis not present

## 2019-05-02 DIAGNOSIS — R2681 Unsteadiness on feet: Secondary | ICD-10-CM | POA: Diagnosis not present

## 2019-05-02 DIAGNOSIS — R41841 Cognitive communication deficit: Secondary | ICD-10-CM | POA: Diagnosis not present

## 2019-05-03 DIAGNOSIS — R41841 Cognitive communication deficit: Secondary | ICD-10-CM | POA: Diagnosis not present

## 2019-05-03 DIAGNOSIS — R2681 Unsteadiness on feet: Secondary | ICD-10-CM | POA: Diagnosis not present

## 2019-05-03 DIAGNOSIS — M6281 Muscle weakness (generalized): Secondary | ICD-10-CM | POA: Diagnosis not present

## 2019-05-06 DIAGNOSIS — R2681 Unsteadiness on feet: Secondary | ICD-10-CM | POA: Diagnosis not present

## 2019-05-06 DIAGNOSIS — M6281 Muscle weakness (generalized): Secondary | ICD-10-CM | POA: Diagnosis not present

## 2019-05-06 DIAGNOSIS — R41841 Cognitive communication deficit: Secondary | ICD-10-CM | POA: Diagnosis not present

## 2019-05-07 DIAGNOSIS — M6281 Muscle weakness (generalized): Secondary | ICD-10-CM | POA: Diagnosis not present

## 2019-05-07 DIAGNOSIS — R41841 Cognitive communication deficit: Secondary | ICD-10-CM | POA: Diagnosis not present

## 2019-05-07 DIAGNOSIS — R2681 Unsteadiness on feet: Secondary | ICD-10-CM | POA: Diagnosis not present

## 2019-05-08 DIAGNOSIS — M6281 Muscle weakness (generalized): Secondary | ICD-10-CM | POA: Diagnosis not present

## 2019-05-08 DIAGNOSIS — R41841 Cognitive communication deficit: Secondary | ICD-10-CM | POA: Diagnosis not present

## 2019-05-08 DIAGNOSIS — R2681 Unsteadiness on feet: Secondary | ICD-10-CM | POA: Diagnosis not present

## 2019-05-10 DIAGNOSIS — R41841 Cognitive communication deficit: Secondary | ICD-10-CM | POA: Diagnosis not present

## 2019-05-10 DIAGNOSIS — R2681 Unsteadiness on feet: Secondary | ICD-10-CM | POA: Diagnosis not present

## 2019-05-10 DIAGNOSIS — M6281 Muscle weakness (generalized): Secondary | ICD-10-CM | POA: Diagnosis not present

## 2019-05-13 DIAGNOSIS — R41841 Cognitive communication deficit: Secondary | ICD-10-CM | POA: Diagnosis not present

## 2019-05-13 DIAGNOSIS — M6281 Muscle weakness (generalized): Secondary | ICD-10-CM | POA: Diagnosis not present

## 2019-05-13 DIAGNOSIS — R2681 Unsteadiness on feet: Secondary | ICD-10-CM | POA: Diagnosis not present

## 2019-05-14 ENCOUNTER — Other Ambulatory Visit: Payer: Self-pay

## 2019-05-14 DIAGNOSIS — M6281 Muscle weakness (generalized): Secondary | ICD-10-CM | POA: Diagnosis not present

## 2019-05-14 DIAGNOSIS — R2681 Unsteadiness on feet: Secondary | ICD-10-CM | POA: Diagnosis not present

## 2019-05-14 DIAGNOSIS — R41841 Cognitive communication deficit: Secondary | ICD-10-CM | POA: Diagnosis not present

## 2019-05-14 MED ORDER — OMEPRAZOLE 40 MG PO CPDR: DELAYED_RELEASE_CAPSULE | ORAL | 1 refills | Status: DC

## 2019-05-15 DIAGNOSIS — M6281 Muscle weakness (generalized): Secondary | ICD-10-CM | POA: Diagnosis not present

## 2019-05-15 DIAGNOSIS — R2681 Unsteadiness on feet: Secondary | ICD-10-CM | POA: Diagnosis not present

## 2019-05-15 DIAGNOSIS — R41841 Cognitive communication deficit: Secondary | ICD-10-CM | POA: Diagnosis not present

## 2019-05-17 DIAGNOSIS — R2681 Unsteadiness on feet: Secondary | ICD-10-CM | POA: Diagnosis not present

## 2019-05-17 DIAGNOSIS — M6281 Muscle weakness (generalized): Secondary | ICD-10-CM | POA: Diagnosis not present

## 2019-05-17 DIAGNOSIS — R41841 Cognitive communication deficit: Secondary | ICD-10-CM | POA: Diagnosis not present

## 2019-05-20 DIAGNOSIS — M6281 Muscle weakness (generalized): Secondary | ICD-10-CM | POA: Diagnosis not present

## 2019-05-20 DIAGNOSIS — R41841 Cognitive communication deficit: Secondary | ICD-10-CM | POA: Diagnosis not present

## 2019-05-20 DIAGNOSIS — R2681 Unsteadiness on feet: Secondary | ICD-10-CM | POA: Diagnosis not present

## 2019-05-21 DIAGNOSIS — R2681 Unsteadiness on feet: Secondary | ICD-10-CM | POA: Diagnosis not present

## 2019-05-21 DIAGNOSIS — R41841 Cognitive communication deficit: Secondary | ICD-10-CM | POA: Diagnosis not present

## 2019-05-21 DIAGNOSIS — M6281 Muscle weakness (generalized): Secondary | ICD-10-CM | POA: Diagnosis not present

## 2019-05-22 DIAGNOSIS — R2681 Unsteadiness on feet: Secondary | ICD-10-CM | POA: Diagnosis not present

## 2019-05-22 DIAGNOSIS — R41841 Cognitive communication deficit: Secondary | ICD-10-CM | POA: Diagnosis not present

## 2019-05-22 DIAGNOSIS — M6281 Muscle weakness (generalized): Secondary | ICD-10-CM | POA: Diagnosis not present

## 2019-05-23 DIAGNOSIS — M6281 Muscle weakness (generalized): Secondary | ICD-10-CM | POA: Diagnosis not present

## 2019-05-23 DIAGNOSIS — R41841 Cognitive communication deficit: Secondary | ICD-10-CM | POA: Diagnosis not present

## 2019-05-23 DIAGNOSIS — R2681 Unsteadiness on feet: Secondary | ICD-10-CM | POA: Diagnosis not present

## 2019-05-24 DIAGNOSIS — R2681 Unsteadiness on feet: Secondary | ICD-10-CM | POA: Diagnosis not present

## 2019-05-24 DIAGNOSIS — M6281 Muscle weakness (generalized): Secondary | ICD-10-CM | POA: Diagnosis not present

## 2019-05-24 DIAGNOSIS — R41841 Cognitive communication deficit: Secondary | ICD-10-CM | POA: Diagnosis not present

## 2019-05-27 DIAGNOSIS — M6281 Muscle weakness (generalized): Secondary | ICD-10-CM | POA: Diagnosis not present

## 2019-05-27 DIAGNOSIS — R2681 Unsteadiness on feet: Secondary | ICD-10-CM | POA: Diagnosis not present

## 2019-05-27 DIAGNOSIS — R41841 Cognitive communication deficit: Secondary | ICD-10-CM | POA: Diagnosis not present

## 2019-05-28 DIAGNOSIS — R41841 Cognitive communication deficit: Secondary | ICD-10-CM | POA: Diagnosis not present

## 2019-05-28 DIAGNOSIS — R2681 Unsteadiness on feet: Secondary | ICD-10-CM | POA: Diagnosis not present

## 2019-05-28 DIAGNOSIS — M6281 Muscle weakness (generalized): Secondary | ICD-10-CM | POA: Diagnosis not present

## 2019-05-29 DIAGNOSIS — M6281 Muscle weakness (generalized): Secondary | ICD-10-CM | POA: Diagnosis not present

## 2019-05-29 DIAGNOSIS — R2681 Unsteadiness on feet: Secondary | ICD-10-CM | POA: Diagnosis not present

## 2019-05-29 DIAGNOSIS — R41841 Cognitive communication deficit: Secondary | ICD-10-CM | POA: Diagnosis not present

## 2019-05-31 DIAGNOSIS — R41841 Cognitive communication deficit: Secondary | ICD-10-CM | POA: Diagnosis not present

## 2019-05-31 DIAGNOSIS — M6281 Muscle weakness (generalized): Secondary | ICD-10-CM | POA: Diagnosis not present

## 2019-05-31 DIAGNOSIS — R2681 Unsteadiness on feet: Secondary | ICD-10-CM | POA: Diagnosis not present

## 2019-06-03 DIAGNOSIS — M6281 Muscle weakness (generalized): Secondary | ICD-10-CM | POA: Diagnosis not present

## 2019-06-03 DIAGNOSIS — R41841 Cognitive communication deficit: Secondary | ICD-10-CM | POA: Diagnosis not present

## 2019-06-03 DIAGNOSIS — R2681 Unsteadiness on feet: Secondary | ICD-10-CM | POA: Diagnosis not present

## 2019-06-04 DIAGNOSIS — R2681 Unsteadiness on feet: Secondary | ICD-10-CM | POA: Diagnosis not present

## 2019-06-04 DIAGNOSIS — R41841 Cognitive communication deficit: Secondary | ICD-10-CM | POA: Diagnosis not present

## 2019-06-04 DIAGNOSIS — M6281 Muscle weakness (generalized): Secondary | ICD-10-CM | POA: Diagnosis not present

## 2019-06-10 DIAGNOSIS — R41841 Cognitive communication deficit: Secondary | ICD-10-CM | POA: Diagnosis not present

## 2019-06-10 DIAGNOSIS — M6281 Muscle weakness (generalized): Secondary | ICD-10-CM | POA: Diagnosis not present

## 2019-06-10 DIAGNOSIS — R2681 Unsteadiness on feet: Secondary | ICD-10-CM | POA: Diagnosis not present

## 2019-06-17 DIAGNOSIS — Z23 Encounter for immunization: Secondary | ICD-10-CM | POA: Diagnosis not present

## 2019-07-15 DIAGNOSIS — Z23 Encounter for immunization: Secondary | ICD-10-CM | POA: Diagnosis not present

## 2019-07-22 DIAGNOSIS — R079 Chest pain, unspecified: Secondary | ICD-10-CM | POA: Diagnosis not present

## 2019-07-22 DIAGNOSIS — Z209 Contact with and (suspected) exposure to unspecified communicable disease: Secondary | ICD-10-CM | POA: Diagnosis not present

## 2019-07-22 DIAGNOSIS — R0789 Other chest pain: Secondary | ICD-10-CM | POA: Diagnosis not present

## 2019-08-09 IMAGING — MR MR MRA HEAD W/O CM
10 of 12 series · 29 of 48 positions shown · non-contrast
Comparison: Brain MRI 11/04/2016.  Cervical spine MRI 11/05/2016.

CLINICAL DATA: 88-year-old male with onset of abnormal vision at
6056 hours today, symptoms gradually resolved. Subsequent brief
episode of word-finding difficulty.

EXAM:
MRI HEAD WITHOUT CONTRAST
MRA HEAD WITHOUT CONTRAST
TECHNIQUE: Multiplanar, multiecho pulse sequences of the brain and surrounding
structures were obtained without intravenous contrast. Angiographic
images of the head were obtained using MRA technique without
contrast.

[Series 5001: ax dwi_tracew · axial · 3.0mm · 1.50mm/px · z∈[-32,+119]mm · 5 of 80 slices shown]
[im 1/80]
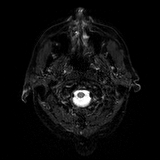
[im 20/80]
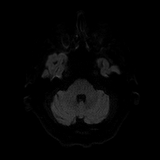
[im 40/80]
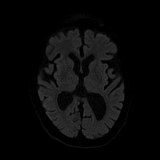
[im 60/80]
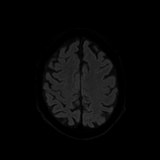
[im 80/80]
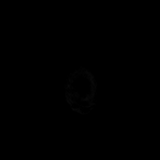

[Series 6001: ax dwi_adc · axial · 3.0mm · 1.50mm/px · z∈[-32,+119]mm · 2 of 40 slices shown]
[im 1/40]
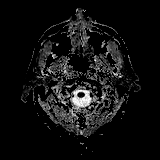
[im 40/40]
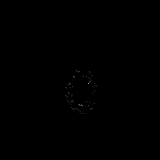

[Series 7001: cor dwi_tracew · coronal · 5.0mm · 1.44mm/px · 5 of 68 slices shown]
[im 1/68]
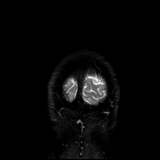
[im 17/68]
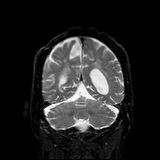
[im 34/68]
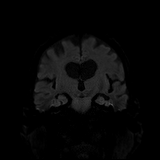
[im 51/68]
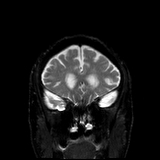
[im 68/68]
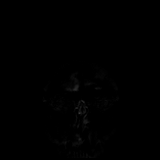

[Series 8001: cor dwi_adc · coronal · 5.0mm · 1.44mm/px · 2 of 34 slices shown]
[im 1/34]
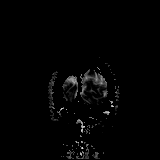
[im 34/34]
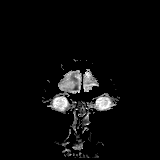

[T1 · sagittal · 5.0mm · 0.75mm/px · 2 of 24 slices shown]
[im 1/24]
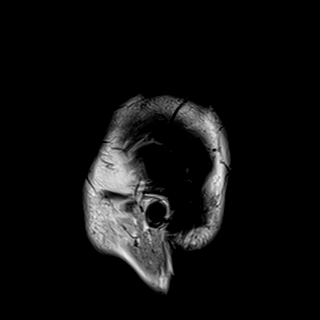
[im 24/24]
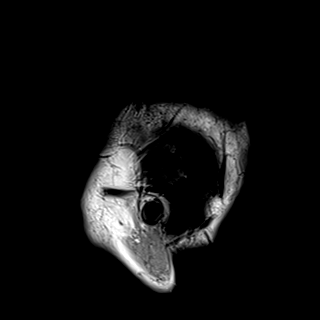

[T2 · axial · 5.0mm · 0.69mm/px · z∈[-36,+119]mm · 2 of 27 slices shown (1 of 2)]
[im 1/27]
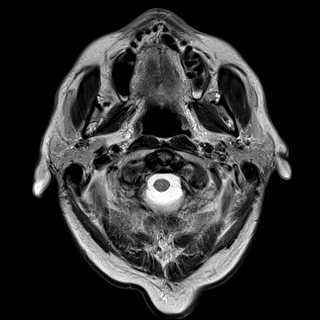
[im 27/27]
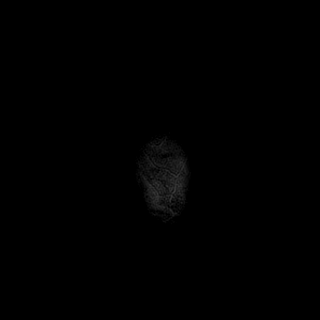

[FLAIR · axial · 5.0mm · 0.43mm/px · z∈[-35,+120]mm · 2 of 27 slices shown]
[im 1/27]
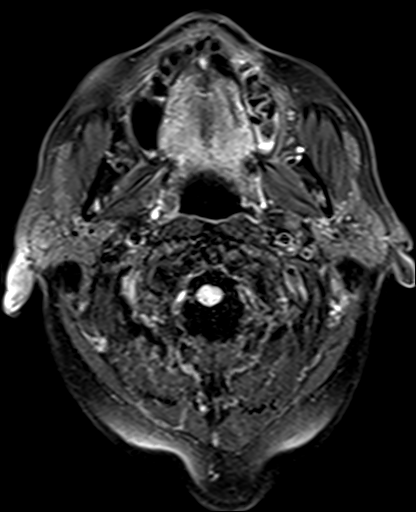
[im 27/27]
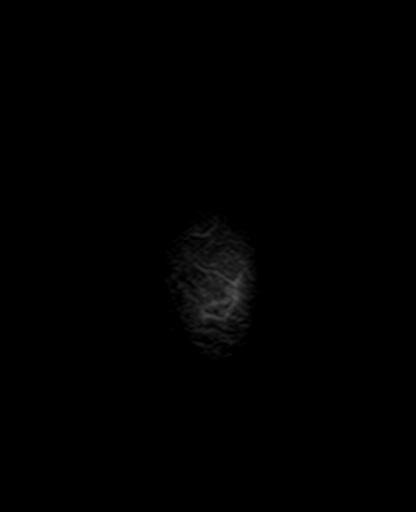

[swi_images · axial · 3.0mm · 0.86mm/px · z∈[-34,+119]mm · 4 of 52 slices shown]
[im 1/52]
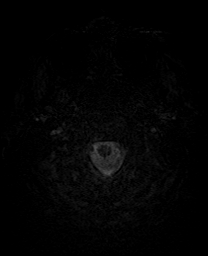
[im 18/52]
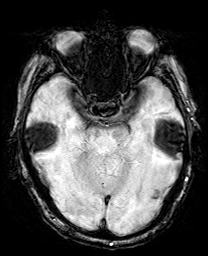
[im 35/52]
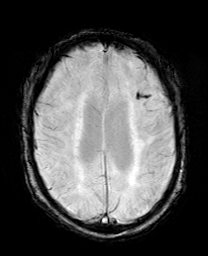
[im 52/52]
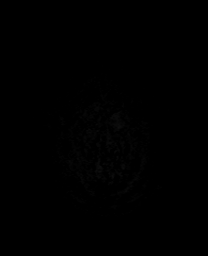

[mip_images(sw) · axial · 24.0mm · 0.86mm/px · z∈[-23,+108]mm · 3 of 45 slices shown]
[im 1/45]
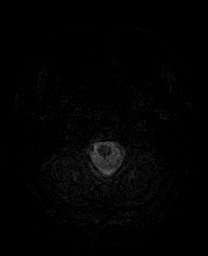
[im 23/45]
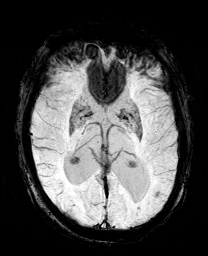
[im 45/45]
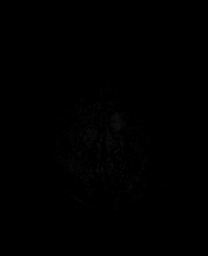

[T2 · coronal · 5.0mm · 0.34mm/px · 2 of 29 slices shown (2 of 2)]
[im 1/29]
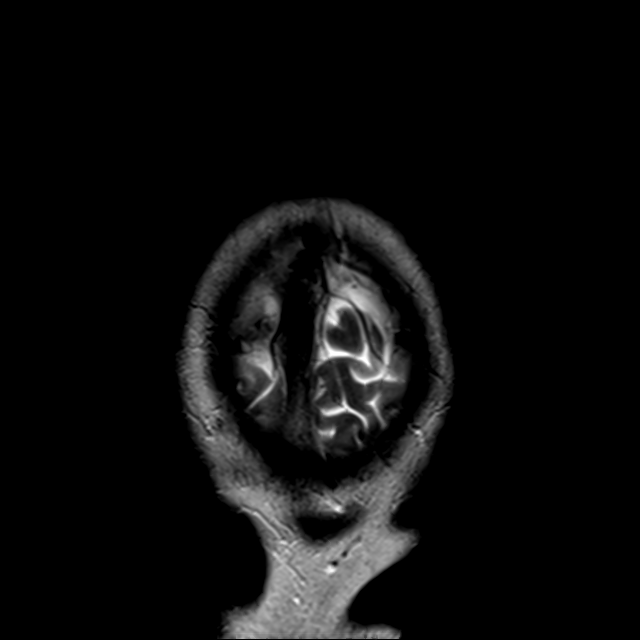
[im 29/29]
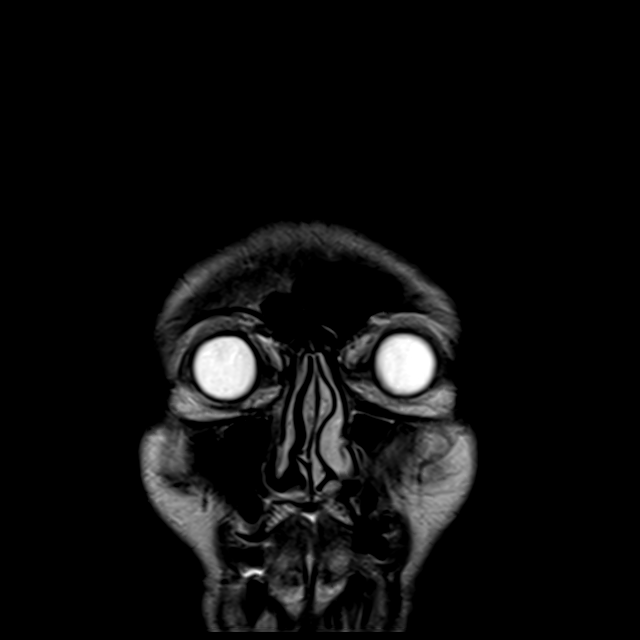

[29 of 48 positions shown; findings below may reference images not displayed]

FINDINGS: MRI HEAD FINDINGS

Brain: Stable cerebral volume and chronic ventriculomegaly since
8867. No restricted diffusion to suggest acute infarction. No
midline shift, mass effect, evidence of mass lesion, extra-axial
collection or acute intracranial hemorrhage. Cervicomedullary
junction and pituitary are within normal limits.

Mild for age periventricular and other scattered cerebral white
matter T2 and FLAIR hyperintensity is stable and nonspecific.
Similar patchy T2 hyperintensity in the pons is stable.

However, there are new chronic blood products in the left middle
frontal gyrus since October 2016. These are on susceptibility weighted
imaging today series 21332, image 36, but were not evident on 3T
susceptibility weighted imaging last year. Part of the appearance is
curvilinear, but this appears to be subcortical rather than along
the surface of the brain. There also appear to be several new small
foci of chronic microhemorrhage in both occipital lobes (series
21332, image 26) which were not evident before. No associated T2 or
FLAIR signal changes with these findings.

The deep gray matter nuclei appear stable and normal for age. The
cerebellum remains normal.

Vascular: Major intracranial vascular flow voids are stable. Mild
intracranial artery tortuosity.

Skull and upper cervical spine: Chronic cervical spinal stenosis at
C3-C4 redemonstrated. See 11/05/2016 cervical spine MRI. Normal bone
marrow signal.

Sinuses/Orbits: Stable postoperative changes to the globes and MRI
appearance of the orbits soft tissues. Paranasal Visualized
paranasal sinuses and mastoids are stable and well pneumatized.

Other: Visible internal auditory structures appear normal. Scalp and
face soft tissues appear negative.

MRA HEAD FINDINGS

Antegrade flow in the posterior circulation with dominant distal
left vertebral artery. Normal right PICA. The left AICA appears
dominant. No distal vertebral stenosis. No basilar stenosis. Normal
SCA and left PCA origins. Fetal type right PCA origin. The left
posterior communicating artery is diminutive or absent.

There is mild left PCA branch irregularity. There is up to moderate
irregularity and stenosis in the right PCA P2 segment (series
[id number], image 13). The distal right PCA branches are normal.

Antegrade flow in both ICA siphons. Evidence of calcified siphon
atherosclerosis but no siphon stenosis. Ophthalmic and right
posterior communicating artery origins appear normal. Patent carotid
termini with normal MCA and ACA origins. Anterior communicating
artery is normal. There is mild bilateral ACA A2 segment
irregularity and stenosis. The bilateral MCA M1 segments, MCA
bi/trifurcations, and MCA branches are within normal limits.
IMPRESSION: 1. No acute infarct or acute intracranial abnormality. However,
several small areas of chronic hemorrhage in the anterior left MCA
and bilateral PCA vascular territories are new since the 8867 MRI.
These appear nonspecific, and without encephalomalacia or other
corresponding discrete parenchymal signal changes.

2. Otherwise stable noncontrast MRI appearance of the brain with
fairly mild for age nonspecific signal changes in the cerebral white
matter and pons.

3. Intracranial MRA demonstrates only mild for age intracranial
atherosclerosis; there is up to moderate stenosis of the right PCA
P2 segment, but minimal to mild intracranial stenoses otherwise.

## 2019-09-09 ENCOUNTER — Encounter: Payer: Self-pay | Admitting: Internal Medicine

## 2019-09-09 NOTE — Progress Notes (Signed)
Comprehensive Evaluation & Examination     This very nice 86 y.o. Ryan Humphrey  presents for a  comprehensive evaluation and management of multiple medical co-morbidities.  Patient has been followed for HTN, HLD, Prediabetes and Vitamin D Deficiency.  Patient has hx/o B12 Deficiency ("305" / 2018) and is on B12 supplements.      Dr Posey Pronto also follows the patient for vascular Dementia, Lumbar Sinal Stenosis, peripheral sensory Neuropathy and AS Cerebrovascular Disease and Familial Tremor. Patient current is living independent section at [facility].      HTN predates circa 1990's. Patient's BP has been controlled at home.  Today's BP: 100/64.   In 2016,  patient had a negative Stress Myoview.  Has aortic atherosclerosis by CT scan. Patient has CKD3b (GFR 35).  Patient denies any cardiac symptoms as chest pain, palpitations, shortness of breath, dizziness or ankle swelling.     Patient's hyperlipidemia is controlled with diet and Simvastatin. Patient denies myalgias or other medication SE's. Last lipids were at goal except slightly elevated Trig's:  Lab Results  Component Value Date   CHOL 147 02/14/2019   HDL 27 (L) 02/14/2019   LDLCALC 91 02/14/2019   TRIG 196 (H) 02/14/2019   CHOLHDL 5.4 (H) 02/14/2019       Patient has been monitored expectantly for glucose intolerance & prediabetes and patient denies reactive hypoglycemic symptoms, visual blurring, diabetic polys or paresthesias. Last A1c was Normal & at goal:  Lab Results  Component Value Date   HGBA1C 5.3 07/26/2018        Finally, patient has history of Vitamin D Deficiency ("42" / 2016)  and last vitamin D was sl low (goal 70-100):  Lab Results  Component Value Date   VD25OH 54 02/14/2019    Current Outpatient Medications on File Prior to Visit  Medication Sig  . Cholecalciferol (VITAMIN D) 50 MCG (2000 UT) tablet Take 1 pill daily.  Marland Kitchen gabapentin (NEURONTIN) 400 MG capsule Take 1 capsule 3 times a day for nerve pain.  Marland Kitchen  omeprazole (PRILOSEC) 40 MG capsule Takes 1 capsule daily for acid reflux.  . propranolol (INDERAL) 20 MG tablet Take 1 tablet 2 times a day for blood pressure.  . sertraline (ZOLOFT) 100 MG tablet 1 pill daily for mood  . simvastatin (ZOCOR) 20 MG tablet Take 1 tablet daily for cholesterol.   No current facility-administered medications on file prior to visit.   Allergies  Allergen Reactions  . Niacin Itching  . Penicillins Other (See Comments)    "rash"   . Tramadol Itching   Past Medical History:  Diagnosis Date  . Anemia   . Arthritis    arthritis,osteopenia,"spinal stenosis"  . Bladder neck obstruction 03/28/2011  . CAD (coronary artery disease)   . Cancer [facility]) 12-06-12   Prostate cancer'98  . Depression   . GERD (gastroesophageal reflux disease)   . H/O hiatal hernia   . H/O vertigo 12-06-12   none recent  . Hyperlipidemia   . IBS (irritable bowel syndrome)   . Neuropathy   . Osteopenia   . Peripheral neuropathy 12-06-12   peripheral neuropathy  . Rhinitis   . RLS (restless legs syndrome)   . Vitamin B12 deficiency    Health Maintenance  Topic Date Due  . URINE MICROALBUMIN  07/27/2019  . TETANUS/TDAP  02/14/2020 (Originally 03/26/1948)  . INFLUENZA VACCINE  Completed  . PNA vac Low Risk Adult  Completed   Immunization History  Administered Date(s) Administered  . Influenza, High  Dose Seasonal PF 03/05/2015, 03/05/2018  . Influenza-Unspecified 04/22/2016, 03/13/2017, 06/13/2018  . Pneumococcal Conjugate-13 05/23/2017  . Pneumococcal Polysaccharide-23 01/20/2016   Last Colon   & EGD - 07.15.2014 - Dr Mellody Memos - F/U - aged out  Past Surgical History:  Procedure Laterality Date  . APPENDECTOMY    . CARPAL TUNNEL RELEASE     both hands  . cataract surgery Left 12-06-12   recent surgery  . CHOLECYSTECTOMY    . COLONOSCOPY WITH PROPOFOL N/A 12/25/2012   Procedure: COLONOSCOPY WITH PROPOFOL;  Surgeon: Garlan Fair, MD;  Location: [facility];  Service:  Endoscopy;  Laterality: N/A;  . ESOPHAGOGASTRODUODENOSCOPY (EGD) WITH PROPOFOL N/A 12/25/2012   Procedure: ESOPHAGOGASTRODUODENOSCOPY (EGD) WITH PROPOFOL;  Surgeon: Garlan Fair, MD;  Location: [facility];  Service: Endoscopy;  Laterality: N/A;  . PROSTATE SURGERY  12-06-12  . TONSILLECTOMY    . TOTAL KNEE ARTHROPLASTY Right 11/25/2014   Procedure: RIGHT TOTAL KNEE ARTHROPLASTY;  Surgeon: Melrose Nakayama, MD;  Location: [place];  Service: Orthopedics;  Laterality: Right;   Family History  Problem Relation Age of Onset  . CAD Father        Deceased, 53  . Dementia Mother        Deceased, 84  . Diabetes Brother   . Hyperlipidemia Brother   . Hypertension Brother   . Dementia Brother   . Colon cancer Neg Hx   . Colon polyps Neg Hx   . Kidney disease Neg Hx   . Esophageal cancer Neg Hx   . Gallbladder disease Neg Hx    Social History   Socioeconomic History  . Marital status: Widowed 2011  . Number of children: 2  . Years of education: College  Occupational History  . Occupation: Retired  Tobacco Use  . Smoking status: Former Smoker    Packs/day: 0.50    Years: 30.00    Pack years: 15.00    Quit date: 06/14/1963    Years since quitting: 56.2  . Smokeless tobacco: Never Used  Substance and Sexual Activity  . Alcohol use: Yes    Alcohol/week: 0.0 standard drinks    Comment: Socially  . Drug use: No  . Sexual activity: No  Social History Narrative   Pt lives at home alone, widowed in 2011, married for 55 years. They have two grown daugthers.   Plays the piano.   Caffeine Use: Rarely    ROS Constitutional: Denies fever, chills, weight loss/gain, headaches, insomnia,  night sweats or change in appetite. Does c/o fatigue. Eyes: Denies redness, blurred vision, diplopia, discharge, itchy or watery eyes.  ENT: Denies discharge, congestion, post nasal drip, epistaxis, sore throat, earache, hearing loss, dental pain, Tinnitus, Vertigo, Sinus pain or snoring.  Cardio: Denies  chest pain, palpitations, irregular heartbeat, syncope, dyspnea, diaphoresis, orthopnea, PND, claudication or edema Respiratory: denies cough, dyspnea, DOE, pleurisy, hoarseness, laryngitis or wheezing.  Gastrointestinal: Denies dysphagia, heartburn, reflux, water brash, pain, cramps, nausea, vomiting, bloating, diarrhea, constipation, hematemesis, melena, hematochezia, jaundice or hemorrhoids Genitourinary: Denies dysuria, frequency, urgency, nocturia, hesitancy, discharge, hematuria or flank pain Musculoskeletal: Denies arthralgia, myalgia, stiffness, Jt. Swelling, pain, limp or strain/sprain. Denies Falls. Skin: Denies puritis, rash, hives, warts, acne, eczema or change in skin lesion Neuro: No weakness, tremor, incoordination, spasms, paresthesia or pain Psychiatric: Denies confusion, memory loss or sensory loss. Denies Depression. Endocrine: Denies change in weight, skin, hair change, nocturia, and paresthesia, diabetic polys, visual blurring or hyper / hypo glycemic episodes.  Heme/Lymph: No excessive bleeding, bruising  or enlarged lymph nodes.  Physical Exam  BP 100/64   Pulse 76   Temp 97.8 F (36.6 C)   Resp 16   Ht 5\' 10"  (1.778 m)   Wt 166 lb 6.4 oz (75.5 kg)   BMI 23.88 kg/m   General Appearance: Well nourished and well groomed and in no apparent distress.  Eyes: PERRLA, EOMs, conjunctiva no swelling or erythema, normal fundi and vessels. Sinuses: No frontal/maxillary tenderness ENT/Mouth: EACs patent / TMs  nl. Nares clear without erythema, swelling, mucoid exudates. Oral hygiene is good. No erythema, swelling, or exudate. Tongue normal, non-obstructing. Tonsils not swollen or erythematous. Hearing normal.  Neck: Supple, thyroid not palpable. No bruits, nodes or JVD. Respiratory: Respiratory effort normal.  BS equal and clear bilateral without rales, rhonci, wheezing or stridor. Cardio: Heart sounds are normal with regular rate and rhythm and no murmurs, rubs or gallops.  Peripheral pulses are normal and equal bilaterally without edema. No aortic or femoral bruits. Chest: symmetric with normal excursions and percussion.  Abdomen: Soft, with Nl bowel sounds. Nontender, no guarding, rebound, hernias, masses, or organomegaly.  Lymphatics: Non tender without lymphadenopathy.  Musculoskeletal: Full ROM all peripheral extremities, joint stability, 5/5 strength, and normal gait. Skin: Warm and dry without rashes, lesions, cyanosis, clubbing or  ecchymosis.  Neuro: Cranial nerves intact, reflexes equal bilaterally. Normal muscle tone, no cerebellar symptoms.  Sensation intact to touch/ Monofilament to the toes bilaterally, but sl decreased to vibration. Pysch: Alert and oriented X 3 with normal affect. Short term recall is limited, but reasoning , insight & judgement seem appropriate.  Assessment and Plan   1. Essential hypertension, benign  - EKG 12-Lead - [name], RETROPERITNL ABD,  LTD - Urinalysis, Routine w reflex microscopic - Microalbumin / creatinine urine ratio - CBC with Differential/Platelet - COMPLETE METABOLIC PANEL WITH GFR - Magnesium - TSH  2. Hyperlipidemia, mixed  - EKG 12-Lead - [name], RETROPERITNL ABD,  LTD - Lipid panel - TSH  3. Abnormal glucose  - EKG 12-Lead - Hemoglobin A1c - Insulin, random  4. Vitamin D deficiency  - VITAMIN D 25 Hydroxy   5. Atherosclerosis of native coronary artery of  native heart without angina pectoris  - EKG 12-Lead - [name], RETROPERITNL ABD,  LTD - Lipid panel  6. Vitamin B12 deficiency  - Vitamin B12  7. Stage 3b chronic kidney disease  - Urinalysis, Routine w reflex microscopic - Microalbumin / creatinine urine ratio  8. BPH with obstruction/lower urinary tract symptoms  - Urinalysis, Routine w reflex microscopic - Microalbumin / creatinine urine ratio - PSA  9. Prostate cancer screening  - PSA  10. Aortic atherosclerosis (HCC)  - EKG 12-Lead - [name], RETROPERITNL ABD,  LTD  11.  Dementia without behavioral disturbance ([place])  12. Screening for ischemic heart disease  - EKG 12-Lead  13. Screening for colorectal cancer  - POC Hemoccult Bld/Stl   14. FHx: heart disease  - EKG 12-Lead - [name], RETROPERITNL ABD,  LTD  15. Screening for AAA (aortic abdominal aneurysm)  - [name], RETROPERITNL ABD,  LTD  16. Former smoker  - EKG 12-Lead - [name], RETROPERITNL ABD,  LTD  17. Medication management  - CBC with Differential/Platelet - COMPLETE METABOLIC PANEL WITH GFR - Magnesium - Lipid panel - TSH - Hemoglobin A1c - Insulin, random - VITAMIN D 25 Hydroxyl          Patient was counseled in prudent diet, weight control to achieve/maintain BMI less than 25, BP monitoring, regular  exercise and medications as discussed.  Discussed med effects and SE's. Routine screening labs and tests as requested with regular follow-up as recommended. Over 40 minutes of exam, counseling, chart review and high complex critical decision making was performed   Kirtland Bouchard, MD

## 2019-09-09 NOTE — Patient Instructions (Signed)

## 2019-09-10 ENCOUNTER — Ambulatory Visit (INDEPENDENT_AMBULATORY_CARE_PROVIDER_SITE_OTHER): Payer: Medicare Other | Admitting: Internal Medicine

## 2019-09-10 ENCOUNTER — Other Ambulatory Visit: Payer: Self-pay

## 2019-09-10 VITALS — BP 100/64 | HR 76 | Temp 97.8°F | Resp 16 | Ht 70.0 in | Wt 166.4 lb

## 2019-09-10 DIAGNOSIS — E538 Deficiency of other specified B group vitamins: Secondary | ICD-10-CM

## 2019-09-10 DIAGNOSIS — Z8249 Family history of ischemic heart disease and other diseases of the circulatory system: Secondary | ICD-10-CM | POA: Diagnosis not present

## 2019-09-10 DIAGNOSIS — F3341 Major depressive disorder, recurrent, in partial remission: Secondary | ICD-10-CM

## 2019-09-10 DIAGNOSIS — Z1211 Encounter for screening for malignant neoplasm of colon: Secondary | ICD-10-CM

## 2019-09-10 DIAGNOSIS — F0390 Unspecified dementia without behavioral disturbance: Secondary | ICD-10-CM

## 2019-09-10 DIAGNOSIS — Z87891 Personal history of nicotine dependence: Secondary | ICD-10-CM

## 2019-09-10 DIAGNOSIS — I10 Essential (primary) hypertension: Secondary | ICD-10-CM

## 2019-09-10 DIAGNOSIS — E782 Mixed hyperlipidemia: Secondary | ICD-10-CM | POA: Diagnosis not present

## 2019-09-10 DIAGNOSIS — N1832 Chronic kidney disease, stage 3b: Secondary | ICD-10-CM | POA: Diagnosis not present

## 2019-09-10 DIAGNOSIS — I700 Atherosclerosis of aorta: Secondary | ICD-10-CM | POA: Diagnosis not present

## 2019-09-10 DIAGNOSIS — R7309 Other abnormal glucose: Secondary | ICD-10-CM | POA: Diagnosis not present

## 2019-09-10 DIAGNOSIS — E559 Vitamin D deficiency, unspecified: Secondary | ICD-10-CM

## 2019-09-10 DIAGNOSIS — Z136 Encounter for screening for cardiovascular disorders: Secondary | ICD-10-CM

## 2019-09-10 DIAGNOSIS — N138 Other obstructive and reflux uropathy: Secondary | ICD-10-CM

## 2019-09-10 DIAGNOSIS — Z79899 Other long term (current) drug therapy: Secondary | ICD-10-CM

## 2019-09-10 DIAGNOSIS — G3184 Mild cognitive impairment, so stated: Secondary | ICD-10-CM

## 2019-09-10 DIAGNOSIS — Z125 Encounter for screening for malignant neoplasm of prostate: Secondary | ICD-10-CM | POA: Diagnosis not present

## 2019-09-10 DIAGNOSIS — I2510 Atherosclerotic heart disease of native coronary artery without angina pectoris: Secondary | ICD-10-CM

## 2019-09-10 DIAGNOSIS — N401 Enlarged prostate with lower urinary tract symptoms: Secondary | ICD-10-CM

## 2019-09-10 DIAGNOSIS — Z1212 Encounter for screening for malignant neoplasm of rectum: Secondary | ICD-10-CM

## 2019-09-10 MED ORDER — PROPRANOLOL HCL 20 MG PO TABS: ORAL_TABLET | ORAL | 3 refills | Status: DC

## 2019-09-10 MED ORDER — GABAPENTIN 400 MG PO CAPS: ORAL_CAPSULE | ORAL | 3 refills | Status: DC

## 2019-09-10 MED ORDER — SIMVASTATIN 20 MG PO TABS: ORAL_TABLET | ORAL | 3 refills | Status: DC

## 2019-09-10 MED ORDER — OMEPRAZOLE 40 MG PO CPDR: DELAYED_RELEASE_CAPSULE | ORAL | 3 refills | Status: DC

## 2019-09-10 MED ORDER — SERTRALINE HCL 100 MG PO TABS: ORAL_TABLET | ORAL | 3 refills | Status: DC

## 2019-09-11 LAB — CBC WITH DIFFERENTIAL/PLATELET
Absolute Monocytes: 660 cells/uL (ref 200–950)
Basophils Absolute: 23 cells/uL (ref 0–200)
Basophils Relative: 0.3 %
Eosinophils Absolute: 98 cells/uL (ref 15–500)
Eosinophils Relative: 1.3 %
HCT: 35.9 % — ABNORMAL LOW (ref 38.5–50.0)
Hemoglobin: 12 g/dL — ABNORMAL LOW (ref 13.2–17.1)
Lymphs Abs: 945 cells/uL (ref 850–3900)
MCH: 32.2 pg (ref 27.0–33.0)
MCHC: 33.4 g/dL (ref 32.0–36.0)
MCV: 96.2 fL (ref 80.0–100.0)
MPV: 11.2 fL (ref 7.5–12.5)
Monocytes Relative: 8.8 %
Neutro Abs: 5775 cells/uL (ref 1500–7800)
Neutrophils Relative %: 77 %
Platelets: 129 10*3/uL — ABNORMAL LOW (ref 140–400)
RBC: 3.73 10*6/uL — ABNORMAL LOW (ref 4.20–5.80)
RDW: 12.8 % (ref 11.0–15.0)
Total Lymphocyte: 12.6 %
WBC: 7.5 10*3/uL (ref 3.8–10.8)

## 2019-09-11 LAB — LIPID PANEL
Cholesterol: 162 mg/dL (ref <200)
HDL: 32 mg/dL — ABNORMAL LOW (ref >=40)
LDL Cholesterol (Calc): 102 mg/dL (calc) — ABNORMAL HIGH
Non-HDL Cholesterol (Calc): 130 mg/dL (calc) — ABNORMAL HIGH (ref <130)
Total CHOL/HDL Ratio: 5.1 (calc) — ABNORMAL HIGH (ref <5.0)
Triglycerides: 167 mg/dL — ABNORMAL HIGH (ref <150)

## 2019-09-11 LAB — URINALYSIS, ROUTINE W REFLEX MICROSCOPIC
Bacteria, UA: NONE SEEN /HPF
Bilirubin Urine: NEGATIVE
Glucose, UA: NEGATIVE
Hgb urine dipstick: NEGATIVE
Hyaline Cast: NONE SEEN /LPF
Leukocytes,Ua: NEGATIVE
Nitrite: NEGATIVE
RBC / HPF: NONE SEEN /HPF (ref 0–2)
Specific Gravity, Urine: 1.02 (ref 1.001–1.03)
Squamous Epithelial / HPF: NONE SEEN /HPF (ref <=5)
WBC, UA: NONE SEEN /HPF (ref 0–5)
pH: <=5 (ref 5.0–8.0)

## 2019-09-11 LAB — COMPLETE METABOLIC PANEL WITH GFR
AG Ratio: 1.9 (calc) (ref 1.0–2.5)
ALT: 11 U/L (ref 9–46)
AST: 14 U/L (ref 10–35)
Albumin: 4.1 g/dL (ref 3.6–5.1)
Alkaline phosphatase (APISO): 77 U/L (ref 35–144)
BUN/Creatinine Ratio: 13 (calc) (ref 6–22)
BUN: 26 mg/dL — ABNORMAL HIGH (ref 7–25)
CO2: 30 mmol/L (ref 20–32)
Calcium: 9 mg/dL (ref 8.6–10.3)
Chloride: 105 mmol/L (ref 98–110)
Creat: 1.94 mg/dL — ABNORMAL HIGH (ref 0.70–1.11)
GFR, Est African American: 35 mL/min/{1.73_m2} — ABNORMAL LOW (ref >=60)
GFR, Est Non African American: 30 mL/min/{1.73_m2} — ABNORMAL LOW (ref >=60)
Globulin: 2.2 g/dL (calc) (ref 1.9–3.7)
Glucose, Bld: 92 mg/dL (ref 65–99)
Potassium: 4.3 mmol/L (ref 3.5–5.3)
Sodium: 142 mmol/L (ref 135–146)
Total Bilirubin: 0.5 mg/dL (ref 0.2–1.2)
Total Protein: 6.3 g/dL (ref 6.1–8.1)

## 2019-09-11 LAB — TSH: TSH: 3.42 mIU/L (ref 0.40–4.50)

## 2019-09-11 LAB — INSULIN, RANDOM: Insulin: 10.9 u[IU]/mL

## 2019-09-11 LAB — HEMOGLOBIN A1C
Hgb A1c MFr Bld: 5.1 % of total Hgb (ref <5.7)
Mean Plasma Glucose: 100 (calc)
eAG (mmol/L): 5.5 (calc)

## 2019-09-11 LAB — PSA: PSA: 0.9 ng/mL (ref <=4.0)

## 2019-09-11 LAB — VITAMIN B12: Vitamin B-12: >2000 pg/mL — ABNORMAL HIGH (ref 200–1100)

## 2019-09-11 LAB — VITAMIN D 25 HYDROXY (VIT D DEFICIENCY, FRACTURES): Vit D, 25-Hydroxy: 50 ng/mL (ref 30–100)

## 2019-09-11 LAB — MAGNESIUM: Magnesium: 2.1 mg/dL (ref 1.5–2.5)

## 2019-09-30 ENCOUNTER — Observation Stay (HOSPITAL_COMMUNITY)
Admission: EM | Admit: 2019-09-30 | Discharge: 2019-09-30 | Disposition: A | Discharge status: Nursing Home (Includes ICF) | Payer: Medicare Other | Attending: Internal Medicine | Admitting: Internal Medicine

## 2019-09-30 ENCOUNTER — Encounter (HOSPITAL_COMMUNITY): Payer: Self-pay | Admitting: Internal Medicine

## 2019-09-30 ENCOUNTER — Emergency Department (HOSPITAL_COMMUNITY): Payer: Medicare Other

## 2019-09-30 ENCOUNTER — Observation Stay (HOSPITAL_BASED_OUTPATIENT_CLINIC_OR_DEPARTMENT_OTHER): Payer: Medicare Other

## 2019-09-30 ENCOUNTER — Other Ambulatory Visit: Payer: Self-pay

## 2019-09-30 DIAGNOSIS — Z8673 Personal history of transient ischemic attack (TIA), and cerebral infarction without residual deficits: Secondary | ICD-10-CM | POA: Diagnosis not present

## 2019-09-30 DIAGNOSIS — R079 Chest pain, unspecified: Secondary | ICD-10-CM | POA: Diagnosis not present

## 2019-09-30 DIAGNOSIS — G609 Hereditary and idiopathic neuropathy, unspecified: Secondary | ICD-10-CM | POA: Insufficient documentation

## 2019-09-30 DIAGNOSIS — R0902 Hypoxemia: Secondary | ICD-10-CM | POA: Diagnosis not present

## 2019-09-30 DIAGNOSIS — I129 Hypertensive chronic kidney disease with stage 1 through stage 4 chronic kidney disease, or unspecified chronic kidney disease: Secondary | ICD-10-CM | POA: Diagnosis not present

## 2019-09-30 DIAGNOSIS — I2510 Atherosclerotic heart disease of native coronary artery without angina pectoris: Secondary | ICD-10-CM | POA: Insufficient documentation

## 2019-09-30 DIAGNOSIS — N289 Disorder of kidney and ureter, unspecified: Secondary | ICD-10-CM

## 2019-09-30 DIAGNOSIS — K219 Gastro-esophageal reflux disease without esophagitis: Secondary | ICD-10-CM | POA: Diagnosis not present

## 2019-09-30 DIAGNOSIS — R0789 Other chest pain: Secondary | ICD-10-CM | POA: Diagnosis not present

## 2019-09-30 DIAGNOSIS — F329 Major depressive disorder, single episode, unspecified: Secondary | ICD-10-CM | POA: Insufficient documentation

## 2019-09-30 DIAGNOSIS — F0150 Vascular dementia without behavioral disturbance: Secondary | ICD-10-CM | POA: Diagnosis not present

## 2019-09-30 DIAGNOSIS — Z96651 Presence of right artificial knee joint: Secondary | ICD-10-CM | POA: Insufficient documentation

## 2019-09-30 DIAGNOSIS — Z8249 Family history of ischemic heart disease and other diseases of the circulatory system: Secondary | ICD-10-CM | POA: Diagnosis not present

## 2019-09-30 DIAGNOSIS — E782 Mixed hyperlipidemia: Secondary | ICD-10-CM | POA: Diagnosis not present

## 2019-09-30 DIAGNOSIS — D649 Anemia, unspecified: Secondary | ICD-10-CM | POA: Insufficient documentation

## 2019-09-30 DIAGNOSIS — Z8546 Personal history of malignant neoplasm of prostate: Secondary | ICD-10-CM | POA: Insufficient documentation

## 2019-09-30 DIAGNOSIS — G2581 Restless legs syndrome: Secondary | ICD-10-CM | POA: Insufficient documentation

## 2019-09-30 DIAGNOSIS — Z87891 Personal history of nicotine dependence: Secondary | ICD-10-CM | POA: Insufficient documentation

## 2019-09-30 DIAGNOSIS — Z20822 Contact with and (suspected) exposure to covid-19: Secondary | ICD-10-CM | POA: Insufficient documentation

## 2019-09-30 DIAGNOSIS — G309 Alzheimer's disease, unspecified: Secondary | ICD-10-CM | POA: Diagnosis present

## 2019-09-30 DIAGNOSIS — I10 Essential (primary) hypertension: Secondary | ICD-10-CM | POA: Diagnosis present

## 2019-09-30 DIAGNOSIS — Z79899 Other long term (current) drug therapy: Secondary | ICD-10-CM | POA: Insufficient documentation

## 2019-09-30 DIAGNOSIS — N183 Chronic kidney disease, stage 3 unspecified: Secondary | ICD-10-CM | POA: Diagnosis present

## 2019-09-30 DIAGNOSIS — F0280 Dementia in other diseases classified elsewhere without behavioral disturbance: Secondary | ICD-10-CM | POA: Diagnosis present

## 2019-09-30 DIAGNOSIS — Z66 Do not resuscitate: Secondary | ICD-10-CM | POA: Diagnosis not present

## 2019-09-30 DIAGNOSIS — R072 Precordial pain: Secondary | ICD-10-CM

## 2019-09-30 HISTORY — DX: Vascular dementia, unspecified severity, without behavioral disturbance, psychotic disturbance, mood disturbance, and anxiety: F01.50

## 2019-09-30 LAB — URINALYSIS, COMPLETE (UACMP) WITH MICROSCOPIC
Bilirubin Urine: NEGATIVE
Glucose, UA: NEGATIVE mg/dL
Hgb urine dipstick: NEGATIVE
Ketones, ur: NEGATIVE mg/dL
Leukocytes,Ua: NEGATIVE
Nitrite: NEGATIVE
Protein, ur: NEGATIVE mg/dL
Specific Gravity, Urine: 1.015 (ref 1.005–1.030)
pH: 5 (ref 5.0–8.0)

## 2019-09-30 LAB — PROTIME-INR
INR: 1 (ref 0.8–1.2)
Prothrombin Time: 13.4 seconds (ref 11.4–15.2)

## 2019-09-30 LAB — LIPID PANEL
Cholesterol: 162 mg/dL (ref 0–200)
HDL: 41 mg/dL (ref >40)
LDL Cholesterol: 111 mg/dL — ABNORMAL HIGH (ref 0–99)
Total CHOL/HDL Ratio: 4 RATIO
Triglycerides: 48 mg/dL (ref <150)
VLDL: 10 mg/dL (ref 0–40)

## 2019-09-30 LAB — BASIC METABOLIC PANEL
Anion gap: 13 (ref 5–15)
BUN: 19 mg/dL (ref 8–23)
CO2: 23 mmol/L (ref 22–32)
Calcium: 8.8 mg/dL — ABNORMAL LOW (ref 8.9–10.3)
Chloride: 106 mmol/L (ref 98–111)
Creatinine, Ser: 1.54 mg/dL — ABNORMAL HIGH (ref 0.61–1.24)
GFR calc Af Amer: 46 mL/min — ABNORMAL LOW (ref >60)
GFR calc non Af Amer: 40 mL/min — ABNORMAL LOW (ref >60)
Glucose, Bld: 111 mg/dL — ABNORMAL HIGH (ref 70–99)
Potassium: 4.2 mmol/L (ref 3.5–5.1)
Sodium: 142 mmol/L (ref 135–145)

## 2019-09-30 LAB — HEPATIC FUNCTION PANEL
ALT: 11 U/L (ref 0–44)
AST: 15 U/L (ref 15–41)
Albumin: 3.4 g/dL — ABNORMAL LOW (ref 3.5–5.0)
Alkaline Phosphatase: 53 U/L (ref 38–126)
Bilirubin, Direct: <0.1 mg/dL (ref 0.0–0.2)
Total Bilirubin: 0.3 mg/dL (ref 0.3–1.2)
Total Protein: 5.7 g/dL — ABNORMAL LOW (ref 6.5–8.1)

## 2019-09-30 LAB — CBC
HCT: 33.5 % — ABNORMAL LOW (ref 39.0–52.0)
Hemoglobin: 11.2 g/dL — ABNORMAL LOW (ref 13.0–17.0)
MCH: 33 pg (ref 26.0–34.0)
MCHC: 33.4 g/dL (ref 30.0–36.0)
MCV: 98.8 fL (ref 80.0–100.0)
Platelets: 117 10*3/uL — ABNORMAL LOW (ref 150–400)
RBC: 3.39 MIL/uL — ABNORMAL LOW (ref 4.22–5.81)
RDW: 13 % (ref 11.5–15.5)
WBC: 6.1 10*3/uL (ref 4.0–10.5)
nRBC: 0 % (ref 0.0–0.2)

## 2019-09-30 LAB — TROPONIN I (HIGH SENSITIVITY)
Troponin I (High Sensitivity): 13 ng/L (ref <18)
Troponin I (High Sensitivity): 17 ng/L (ref <18)
Troponin I (High Sensitivity): 23 ng/L — ABNORMAL HIGH (ref <18)

## 2019-09-30 LAB — D-DIMER, QUANTITATIVE: D-Dimer, Quant: 0.34 ug/mL-FEU (ref 0.00–0.50)

## 2019-09-30 LAB — LIPASE, BLOOD: Lipase: 40 U/L (ref 11–51)

## 2019-09-30 LAB — SEDIMENTATION RATE: Sed Rate: 10 mm/hr (ref 0–16)

## 2019-09-30 LAB — SARS CORONAVIRUS 2 (TAT 6-24 HRS): SARS Coronavirus 2: NEGATIVE

## 2019-09-30 LAB — ECHOCARDIOGRAM COMPLETE
Height: 70 in
Weight: 2662.42 oz

## 2019-09-30 MED ORDER — PROPRANOLOL HCL 20 MG PO TABS
20.0000 mg | ORAL_TABLET | Freq: Two times a day (BID) | ORAL | Status: DC
  Administered 2019-09-30: 20 mg via ORAL
  Filled 2019-09-30 (×2): qty 1

## 2019-09-30 MED ORDER — LACTATED RINGERS IV SOLN: INTRAVENOUS | Status: DC

## 2019-09-30 MED ORDER — PANTOPRAZOLE SODIUM 40 MG PO TBEC
40.0000 mg | DELAYED_RELEASE_TABLET | Freq: Every day | ORAL | Status: DC
  Administered 2019-09-30: 40 mg via ORAL
  Filled 2019-09-30: qty 1

## 2019-09-30 MED ORDER — SERTRALINE HCL 100 MG PO TABS
100.0000 mg | ORAL_TABLET | Freq: Every day | ORAL | Status: DC
  Administered 2019-09-30: 100 mg via ORAL
  Filled 2019-09-30: qty 1

## 2019-09-30 MED ORDER — ACETAMINOPHEN 325 MG PO TABS: 650.0000 mg | ORAL_TABLET | ORAL | Status: DC | PRN

## 2019-09-30 MED ORDER — ONDANSETRON HCL 4 MG/2ML IJ SOLN: 4.0000 mg | Freq: Four times a day (QID) | INTRAMUSCULAR | Status: DC | PRN

## 2019-09-30 MED ORDER — ENOXAPARIN SODIUM 40 MG/0.4ML ~~LOC~~ SOLN
40.0000 mg | SUBCUTANEOUS | Status: DC
  Administered 2019-09-30: 40 mg via SUBCUTANEOUS
  Filled 2019-09-30: qty 0.4

## 2019-09-30 MED ORDER — SIMVASTATIN 20 MG PO TABS
20.0000 mg | ORAL_TABLET | Freq: Every day | ORAL | Status: DC
  Administered 2019-09-30: 20 mg via ORAL
  Filled 2019-09-30: qty 1

## 2019-09-30 MED ORDER — SODIUM CHLORIDE 0.9% FLUSH: 3.0000 mL | Freq: Once | INTRAVENOUS | Status: DC

## 2019-09-30 MED ORDER — GABAPENTIN 400 MG PO CAPS
400.0000 mg | ORAL_CAPSULE | Freq: Three times a day (TID) | ORAL | Status: DC
  Administered 2019-09-30: 400 mg via ORAL
  Filled 2019-09-30: qty 1

## 2019-09-30 MED ORDER — LACTATED RINGERS IV SOLN: INTRAVENOUS | Status: AC

## 2019-09-30 NOTE — H&P (Signed)
History and Physical    Ryan Humphrey Ingalls BVQ:[id number] DOB: [birth year] DOA: 09/30/2019  PCP: Unk Pinto, MD  Patient coming from: [facility]  Chief Complaint:  Chief Complaint  Patient presents with  . Chest Pain     HPI:    86 year old male with past medical history of vascular dementia, coronary artery disease, hypertension, chronic kidney disease stage III, gastroesophageal reflux disease, hyperlipidemia presented to [facility] emergency department with complaints of chest discomfort.  Patient is a poor historian due to known history of vascular dementia.  Patient explains that yesterday evening shortly after getting into bed he began to experience chest discomfort.  He describes this discomfort as severe in intensity, midsternal, nonradiating.  Patient explains of the chest discomfort "did not last very long."  Patient denies associated shortness of breath, diaphoresis, nausea, vomiting, cough.  Patient denies any recent history of paroxysmal nocturnal dyspnea or pillow orthopnea or dyspnea on exertion.  EMS provided patient with aspirin in route.  Upon arrival to the emergency department patient was chest pain-free.  Initial troponin, EKG and chest x-ray unremarkable.  The hospitalist group was then called to assess the patient for admission the hospital.    Review of Systems: Unable to fully obtain review of systems due to patient being a poor historian.   Past Medical History:  Diagnosis Date  . Anemia   . Arthritis    arthritis,osteopenia,"spinal stenosis"  . Bladder neck obstruction 03/28/2011  . CAD (coronary artery disease)   . Cancer [facility]) 12-06-12   Prostate cancer'98  . CKD (chronic kidney disease) stage 3, GFR 30-59 ml/min 08/17/2017  . Depression   . Essential hypertension, benign 04/10/2009  . GERD (gastroesophageal reflux disease)   . GERD without esophagitis 11/05/2016  . H/O hiatal hernia   . H/O vertigo 12-06-12   none recent  . Hyperlipidemia   . IBS (irritable bowel syndrome)   . Mixed hyperlipidemia 04/10/2009  . Neuropathy   . Osteopenia   . Peripheral neuropathy 12-06-12   peripheral neuropathy  . Rhinitis   . RLS (restless legs syndrome)   . Vascular dementia ([place]) 09/30/2019  . Vitamin B12 deficiency     Past Surgical History:  Procedure Laterality Date  . APPENDECTOMY    . CARPAL TUNNEL RELEASE     both hands  . cataract surgery Left 12-06-12   recent surgery  . CHOLECYSTECTOMY    . COLONOSCOPY WITH PROPOFOL N/A 12/25/2012   Procedure: COLONOSCOPY WITH PROPOFOL;  Surgeon: Garlan Fair, MD;  [facility];  Service: Endoscopy;  Laterality: N/A;  . ESOPHAGOGASTRODUODENOSCOPY (EGD) WITH PROPOFOL N/A 12/25/2012   Procedure: ESOPHAGOGASTRODUODENOSCOPY (EGD) WITH PROPOFOL;  Surgeon: Garlan Fair, MD;  [facility];  Service: Endoscopy;  Laterality: N/A;  . PROSTATE SURGERY  12-06-12  . TONSILLECTOMY    . TOTAL KNEE ARTHROPLASTY Right 11/25/2014   Procedure: RIGHT TOTAL KNEE ARTHROPLASTY;  Surgeon: Melrose Nakayama, MD;  Location: [place];  Service: Orthopedics;  Laterality: Right;     reports that he quit smoking about 56 years ago. He has a 15.00 pack-year smoking history. He has never used smokeless tobacco. He reports current alcohol use. He reports that he does not use drugs.  Allergies  Allergen Reactions  . Niacin Itching  . Penicillins Other (See Comments)    "rash"   . Tramadol Itching    Family History  Problem Relation Age of Onset  . CAD Father  Deceased, 20  . Dementia Mother        Deceased, 73  . Diabetes Brother   . Hyperlipidemia Brother   . Hypertension Brother   . Dementia Brother   . Colon cancer Neg Hx   . Colon polyps Neg Hx   . Kidney disease Neg Hx   . Esophageal cancer Neg Hx   . Gallbladder disease Neg Hx      Prior to Admission medications   Medication Sig Start Date End Date Taking? Authorizing Provider    Cholecalciferol (VITAMIN D) 50 MCG (2000 UT) tablet Take 1 pill daily. 01/10/19   Unk Pinto, MD  gabapentin (NEURONTIN) 400 MG capsule Take 1 capsule 3 times a day for nerve pain. 09/10/19   Unk Pinto, MD  omeprazole (PRILOSEC) 40 MG capsule Takes 1 capsule Daily for  Indigestion & Heartburn 09/10/19   Unk Pinto, MD  propranolol (INDERAL) 20 MG tablet Take 1 tablet   2 x /day for BP 09/10/19   Unk Pinto, MD  sertraline (ZOLOFT) 100 MG tablet 1 tablet Daily for Mood 09/10/19 03/30/30  Unk Pinto, MD  simvastatin (ZOCOR) 20 MG tablet Take 1 tablet Daily for Cholesterol. 09/10/19   Unk Pinto, MD    Physical Exam: Vitals:   09/30/19 0152 09/30/19 0330 09/30/19 0345 09/30/19 0400  BP:  (!) 148/62  (!) 141/60  Pulse:  79  73  Resp:  19  12  Temp:      TempSrc:      SpO2:  100% 100% 99%  Weight: 75.5 kg     Height: '5\' 10"'  (1.778 m)       Constitutional: Acute alert and oriented x2, no associated distress.   Skin: no rashes, no lesions, good skin turgor noted. Eyes: Pupils are equally reactive to light.  No evidence of scleral icterus or conjunctival pallor.  ENMT: Mucous membranes are moist. Posterior pharynx clear of any exudate or lesions. Normal dentition.   Neck: normal, supple, no masses, no thyromegaly Respiratory: clear to auscultation bilaterally, no wheezing, no crackles. Normal respiratory effort. No accessory muscle use.  Cardiovascular: Regular rate and rhythm, no murmurs / rubs / gallops. No extremity edema. 2+ pedal pulses. No carotid bruits.  Back:   Nontender without crepitus or deformity. Abdomen: Abdomen is soft and nontender.  No evidence of intra-abdominal masses.  Positive bowel sounds noted in all quadrants.   Musculoskeletal: No joint deformity upper and lower extremities. Good ROM, no contractures. Normal muscle tone.  Neurologic: Patient exhibits forgetfulness throughout the interview repeating some the same facts multiple times.   CN 2-12 grossly intact. Sensation intact, strength noted to be 5 out of 5 in all 4 extremities.  Patient is following all commands.  Patient is responsive to verbal stimuli.   Psychiatric: Patient presents as a normal mood with appropriate affect.  Patient does not seem to currently possess insight as to the current situation.   Labs on Admission: I have personally reviewed following labs and imaging studies -   CBC: Recent Labs  Lab 09/30/19 0209  WBC 6.1  HGB 11.2*  HCT 33.5*  MCV 98.8  PLT 672*   Basic Metabolic Panel: Recent Labs  Lab 09/30/19 0209  NA 142  K 4.2  CL 106  CO2 23  GLUCOSE 111*  BUN 19  CREATININE 1.54*  CALCIUM 8.8*   GFR: Estimated Creatinine Clearance: 34.2 mL/min (A) (by C-G formula based on SCr of 1.54 mg/dL (H)). Liver Function Tests: No results for  input(s): AST, ALT, ALKPHOS, BILITOT, PROT, ALBUMIN in the last 168 hours. No results for input(s): LIPASE, AMYLASE in the last 168 hours. No results for input(s): AMMONIA in the last 168 hours. Coagulation Profile: Recent Labs  Lab 09/30/19 0209  INR 1.0   Cardiac Enzymes: No results for input(s): CKTOTAL, CKMB, CKMBINDEX, TROPONINI in the last 168 hours. BNP (last 3 results) No results for input(s): PROBNP in the last 8760 hours. HbA1C: No results for input(s): HGBA1C in the last 72 hours. CBG: No results for input(s): GLUCAP in the last 168 hours. Lipid Profile: No results for input(s): CHOL, HDL, LDLCALC, TRIG, CHOLHDL, LDLDIRECT in the last 72 hours. Thyroid Function Tests: No results for input(s): TSH, T4TOTAL, FREET4, T3FREE, THYROIDAB in the last 72 hours. Anemia Panel: No results for input(s): VITAMINB12, FOLATE, FERRITIN, TIBC, IRON, RETICCTPCT in the last 72 hours. Urine analysis:    Component Value Date/Time   COLORURINE YELLOW 09/10/2019 1632   APPEARANCEUR CLEAR 09/10/2019 1632   LABSPEC 1.020 09/10/2019 1632   PHURINE < OR = 5.0 09/10/2019 1632   GLUCOSEU NEGATIVE  09/10/2019 1632   HGBUR NEGATIVE 09/10/2019 1632   BILIRUBINUR NEGATIVE 01/18/2018 0915   KETONESUR TRACE (A) 09/10/2019 1632   PROTEINUR TRACE (A) 09/10/2019 1632   UROBILINOGEN 0.2 01/03/2015 1330   NITRITE NEGATIVE 09/10/2019 1632   LEUKOCYTESUR NEGATIVE 09/10/2019 1632    Radiological Exams on Admission personally reviewed: DG Chest 2 View  Result Date: 09/30/2019 CLINICAL DATA:  Chest pain for 2 days EXAM: CHEST - 2 VIEW COMPARISON:  Radiograph 03/17/2018 FINDINGS: Mild chronic interstitial thickening is similar to comparison exams. Stable biapical pleuroparenchymal scarring. No consolidation, features of edema, pneumothorax, or effusion. The aorta is calcified. The remaining cardiomediastinal contours are unremarkable. No acute osseous or soft tissue abnormality. Multilevel degenerative changes are present in the imaged portions of the spine. Multilevel flowing anterior osteophytosis, compatible with features of diffuse idiopathic skeletal hyperostosis (DISH). Minimal degenerative changes in the shoulders. IMPRESSION: 1. Mild chronic interstitial thickening, unchanged from comparison exams. 2. No acute cardiopulmonary abnormality. Electronically Signed   By: Lovena Le M.D.   On: 09/30/2019 02:29    EKG: Personally reviewed.  Rhythm is normal sinus rhythm with heart rate of 81 bpm.  Right bundle branch block noted, no dynamic ST segment changes appreciated with no appreciable change compared to prior EKG.  Assessment/Plan Principal Problem:   Chest pain   Patient presenting with one episode of chest discomfort with both typical and atypical features  Patient is currently chest pain-free  Patient has history of coronary artery disease per review of previous notes although I am unaware as to whether or not the patient is underwent cardiac catheterization in the past.  It seems that patient's last nuclear stress test was in 2016.  Considering patient's advanced age and comorbidities  it is unlikely that patient would be agreeable to proceed with cardiac catheterization in the event of an abnormal stress test.  I believe this scenario will likely need to be revisited with the patient and daughter in the morning.  we will temporarily make patient n.p.o. and likely consult cardiology in the morning to get their input.  Continuing to cycle cardiac enzymes  Monitoring patient on telemetry  Obtaining hepatic function panel, lipase, ESR, D-dimer  Active Problems:   Mixed hyperlipidemia   Continue home regimen of statin therapy  Lipid panel    Essential hypertension, benign   Continue home regimen of propranolol    CKD (chronic kidney  disease) stage 3, GFR 30-59 ml/min   Creatinine not significantly different than baseline (1.3-1.4)  Monitor renal function with serial chemistries    Vascular dementia (HCC)   Notable history of vascular dementia and vitamin B12 deficiency per review of previous notes.  Patient is exhibiting significant forgetfulness and transient confusion per my discussion with him.  I am uncertain as to whether or not this is his baseline.  Will obtain vitamin B12, folate, TSH    GERD without esophagitis  Continue home regimen of PPI    Code Status:  DNR Family Communication: Patient declines that I call his daughter at this time  Status is: Observation  The patient remains OBS appropriate and will d/c before 2 midnights.  Dispo: The patient is from: ALF              Anticipated d/c is to: ALF              Anticipated d/c date is: 1 day              Patient currently is not medically stable to d/c.        Vernelle Emerald MD [facility] Pager [phone]  If 7PM-7AM, please contact night-coverage [email or web address] Use universal [place] password for that web site. If you do not have the password, please call the hospital operator.  09/30/2019, 4:20 AM

## 2019-09-30 NOTE — ED Notes (Signed)
RN gave report to [place] at [facility]. Discharge instructions and follow up care reviewed w/ pt and pt's daughter. Pt's caregivers at no further questions. IV removed, pt left with daughter to return to [facility] [place].

## 2019-09-30 NOTE — Progress Notes (Signed)
86 year old gentleman from assisted living place has history of hypertension, hyperlipidemia and coronary artery disease developed sudden onset of chest pain mostly across the front of the chest.  No other symptoms.  Given a dose of aspirin on the way to the hospital by EMS, no recurrence of chest pain since then. Patient seen in the emergency room waiting for bed, denies any complaints.  No more pain since admission.  Chest pain: Probably atypical. Troponin and repeat troponins are nonischemic. EKG and repeat EKG are consistent with previous EKGs with some left axis deviation and right bundle branch block.  No changes. Currently chest pain free. Continue monitoring. Will order an echocardiogram to look for any wall motion abnormalities. If echocardiogram is negative, symptomatic treatment. D-dimer was normal.  TSH, B12 all normal.  Disposition: If echocardiogram normal and patient remains asymptomatic, will discharge patient back to assisted living facility today.  Will send for a follow-up with cardiology. Patient had similar symptoms in the past with negative ischemia work-up. Called and updated patient's daughter on the phone.

## 2019-09-30 NOTE — ED Triage Notes (Signed)
Patient from [facility] [place] arrived by EMS for chest pain. Chest pain to the left side of chest for 20 mins but now has subsided. EMS administered 324 aspirin.

## 2019-09-30 NOTE — Discharge Summary (Signed)
Physician Discharge Summary  Ryan Humphrey [id number] DOB: Jun 28, 1928 DOA: 09/30/2019  PCP: Unk Pinto, MD  Admit date: 09/30/2019 Discharge date: 09/30/2019  Admitted From: Assisted living facility Disposition: Assisted living facility  Recommendations for Outpatient Follow-up:  1. Follow up with PCP in 1-2 weeks 2. Please obtain BMP/CBC in one week 3. Referral sent to cardiology, will call with follow-up plan.  Discharge Condition: Stable CODE STATUS: DNR Diet recommendation: Low-salt diet  Discharge summary: 86 year old gentleman with history of vascular dementia, chronic kidney disease stage IIIa, hypertension, GERD and hyperlipidemia who presented to emergency room with chest discomfort, midsternal, nonradiating.  Transient with no other associated symptoms.  No associated shortness of breath, diaphoresis, nausea, vomiting or cough.  Patient received aspirin in route to the emergency room.  Patient has been chest pain-free since arrival.  Chest pain: Probably atypical.  Acute coronary syndrome ruled out. Troponin and repeat troponins are nonischemic. EKG and repeat EKG are consistent with previous EKGs with some left axis deviation and right bundle branch block.  No changes. Currently chest pain free. Echocardiogram with normal ejection fraction.  No regional wall motion abnormalities. D-dimer was normal.  TSH, B12 all normal. Reviewed his previous reports, cardiac cath in 2010 with mild coronary artery disease. Lexiscan 2016, no reversible ischemia. Occasional similar chest pain, previously evaluated by cardiology and thought to be nonischemic.  Since patient has no more symptoms and his troponin, EKG and echocardiogram is normal, will discharge him back to assisted living facility.  For follow-up, will send a referral to cardiology.  Cardiology group has been seeing him in the past. No change in medications needed.  Discharge Diagnoses:  Principal Problem:   Chest  pain Active Problems:   Mixed hyperlipidemia   Essential hypertension, benign   GERD without esophagitis   CKD (chronic kidney disease) stage 3, GFR 30-59 ml/min   Vascular dementia [facility])    Discharge Instructions  Discharge Instructions    Ambulatory referral to Cardiology   Complete by: As directed    Chest pain . Follow up.   Call MD for:  difficulty breathing, headache or visual disturbances   Complete by: As directed    Call MD for:  severe uncontrolled pain   Complete by: As directed    Diet - low sodium heart healthy   Complete by: As directed    Increase activity slowly   Complete by: As directed      Allergies as of 09/30/2019      Reactions   Niacin Itching   Penicillins Other (See Comments)   "rash"   Tramadol Itching      Medication List    TAKE these medications   gabapentin 400 MG capsule Commonly known as: NEURONTIN Take 1 capsule 3 times a day for nerve pain. What changed:   how much to take  how to take this  when to take this  additional instructions   omeprazole 40 MG capsule Commonly known as: PRILOSEC Takes 1 capsule Daily for  Indigestion & Heartburn What changed:   how much to take  how to take this  when to take this  additional instructions   propranolol 20 MG tablet Commonly known as: INDERAL Take 1 tablet   2 x /day for BP What changed:   how much to take  how to take this  when to take this  additional instructions   sertraline 100 MG tablet Commonly known as: ZOLOFT 1 tablet Daily for Mood What changed:  how much to take  how to take this  when to take this  additional instructions   simvastatin 20 MG tablet Commonly known as: ZOCOR Take 1 tablet Daily for Cholesterol. What changed:   how much to take  how to take this  when to take this  additional instructions   Vitamin D 50 MCG (2000 UT) tablet Take 1 pill daily. What changed:   how much to take  how to take this  when to take  this  additional instructions       Allergies  Allergen Reactions  . Niacin Itching  . Penicillins Other (See Comments)    "rash"   . Tramadol Itching    Procedures/Studies: DG Chest 2 View  Result Date: 09/30/2019 CLINICAL DATA:  Chest pain for 2 days EXAM: CHEST - 2 VIEW COMPARISON:  Radiograph 03/17/2018 FINDINGS: Mild chronic interstitial thickening is similar to comparison exams. Stable biapical pleuroparenchymal scarring. No consolidation, features of edema, pneumothorax, or effusion. The aorta is calcified. The remaining cardiomediastinal contours are unremarkable. No acute osseous or soft tissue abnormality. Multilevel degenerative changes are present in the imaged portions of the spine. Multilevel flowing anterior osteophytosis, compatible with features of diffuse idiopathic skeletal hyperostosis (DISH). Minimal degenerative changes in the shoulders. IMPRESSION: 1. Mild chronic interstitial thickening, unchanged from comparison exams. 2. No acute cardiopulmonary abnormality. Electronically Signed   By: Lovena Le M.D.   On: 09/30/2019 02:29   ECHOCARDIOGRAM COMPLETE  Result Date: 09/30/2019    ECHOCARDIOGRAM REPORT   Patient Name:   Ryan Humphrey Date of Exam: 09/30/2019 Medical Rec #:  [id number]      Height:       70.0 in Accession #:    [id number]     Weight:       166.4 lb Date of Birth:  Sep 12, 1928     BSA:          1.930 m Patient Age:    86 years       BP:           131/78 mmHg Patient Gender: M              HR:           72 bpm. Exam Location:  Inpatient Procedure: 2D Echo, Cardiac Doppler and Color Doppler Indications:    R07.9* Chest pain, unspecified  History:        Patient has prior history of Echocardiogram examinations, most                 recent 11/06/2016. CAD, TIA, Signs/Symptoms:Chest Pain and                 Altered Mental Status; Risk Factors:Hypertension and                 Dyslipidemia. Vascular dementia.  Sonographer:    Roseanna Rainbow RDCS Referring Phys: [id number]  [facility]  Sonographer Comments: Technically difficult study due to poor echo windows, no subcostal window, suboptimal apical window and suboptimal parasternal window. IMPRESSIONS  1. Left ventricular ejection fraction, by estimation, is 60 to 65%. The left ventricle has normal function. The left ventricle has no regional wall motion abnormalities. There is mild left ventricular hypertrophy of the basal-septal segment. Left ventricular diastolic parameters are consistent with Grade I diastolic dysfunction (impaired relaxation).  2. Right ventricular systolic function is normal. The right ventricular size is normal.  3. The mitral valve is normal in structure. No evidence of mitral  valve regurgitation. No evidence of mitral stenosis.  4. The aortic valve is normal in structure. Aortic valve regurgitation is not visualized. No aortic stenosis is present.  5. 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 There is mild dilatation of the ascending aorta measuring 40 mm.  6. The inferior vena cava is normal in size with greater than 50% respiratory variability, suggesting right atrial pressure of 3 mmHg. FINDINGS  Left Ventricle: Left ventricular ejection fraction, by estimation, is 60 to 65%. The left ventricle has normal function. The left ventricle has no regional wall motion abnormalities. The left ventricular internal cavity size was normal in size. There is  mild left ventricular hypertrophy of the basal-septal segment. Left ventricular diastolic parameters are consistent with Grade I diastolic dysfunction (impaired relaxation). Right Ventricle: The right ventricular size is normal. No increase in right ventricular wall thickness. Right ventricular systolic function is normal. Left Atrium: Left atrial size was normal in size. Right Atrium: Right atrial size was normal in size. Pericardium: There is no evidence of pericardial effusion. Mitral Valve: The mitral valve is normal in structure. Normal  mobility of the mitral valve leaflets. Mild to moderate mitral annular calcification. No evidence of mitral valve regurgitation. No evidence of mitral valve stenosis. Tricuspid Valve: The tricuspid valve is normal in structure. Tricuspid valve regurgitation is not demonstrated. No evidence of tricuspid stenosis. Aortic Valve: The aortic valve is normal in structure. Aortic valve regurgitation is not visualized. No aortic stenosis is present. Pulmonic Valve: The pulmonic valve was normal in structure. Pulmonic valve regurgitation is not visualized. No evidence of pulmonic stenosis. Aorta: The aortic root is normal in size and structure. There is mild dilatation of the ascending aorta measuring 40 mm. There is an aneurysm involving the aortic sinuses of Valsalva. The aneurysm measures 41 mm. Venous: The inferior vena cava is normal in size with greater than 50% respiratory variability, suggesting right atrial pressure of 3 mmHg. IAS/Shunts: No atrial level shunt detected by color flow Doppler.  LEFT VENTRICLE PLAX 2D LVIDd:         3.40 cm     Diastology LVIDs:         2.10 cm     LV e' lateral:   5.84 cm/s LV PW:         1.00 cm     LV E/e' lateral: 12.3 LV IVS:        1.30 cm     LV e' medial:    3.44 cm/s LVOT diam:     1.70 cm     LV E/e' medial:  20.8 LV SV:         36 LV SV Index:   19 LVOT Area:     2.27 cm  LV Volumes (MOD) LV vol d, MOD A2C: 53.1 ml LV vol d, MOD A4C: 31.7 ml LV vol s, MOD A2C: 17.9 ml LV vol s, MOD A4C: 10.5 ml LV SV MOD A2C:     35.2 ml LV SV MOD A4C:     31.7 ml LV SV MOD BP:      28.6 ml RIGHT VENTRICLE RV S prime:     8.18 cm/s TAPSE (M-mode): 1.2 cm LEFT ATRIUM             Index       RIGHT ATRIUM           Index LA diam:        2.20 cm 1.14 cm/m  RA Area:     11.70 cm LA Vol (A2C):   28.3 ml 14.66 ml/m RA Volume:   26.50 ml  13.73 ml/m LA Vol (A4C):   24.5 ml 12.69 ml/m LA Biplane Vol: 26.7 ml 13.83 ml/m  AORTIC VALVE LVOT Vmax:   77.80 cm/s LVOT Vmean:  50.300 cm/s LVOT VTI:     0.160 m  AORTA Ao Root diam: 3.50 cm Ao Asc diam:  3.95 cm MITRAL VALVE MV Area (PHT): 3.31 cm    SHUNTS MV Decel Time: 229 msec    Systemic VTI:  0.16 m MV E velocity: 71.60 cm/s  Systemic Diam: 1.70 cm MV A velocity: 83.60 cm/s MV E/A ratio:  0.86 Candee Furbish MD Electronically signed by Candee Furbish MD Signature Date/Time: 09/30/2019/3:11:55 PM    Final     Subjective: Patient seen and examined.  He was in the emergency room.  Denies any complaints.   Discharge Exam: Vitals:   09/30/19 1430 09/30/19 1500  BP: 121/62 (!) 122/56  Pulse:  72  Resp: 15 16  Temp:    SpO2:  98%   Vitals:   09/30/19 1330 09/30/19 1400 09/30/19 1430 09/30/19 1500  BP: 131/78 (!) 131/54 121/62 (!) 122/56  Pulse: 75 72  72  Resp: 19 19 15 16   Temp:      TempSrc:      SpO2: 100% 100%  98%  Weight:      Height:        General: Pt is alert, awake, not in acute distress Cardiovascular: RRR, S1/S2 +, no rubs, no gallops Respiratory: CTA bilaterally, no wheezing, no rhonchi Abdominal: Soft, NT, ND, bowel sounds + Extremities: no edema, no cyanosis    The results of significant diagnostics from this hospitalization (including imaging, microbiology, ancillary and laboratory) are listed below for reference.     Microbiology: Recent Results (from the past 240 hour(s))  SARS CORONAVIRUS 2 (TAT 6-24 HRS) Nasopharyngeal Nasopharyngeal Swab     Status: None   Collection Time: 09/30/19  3:53 AM   Specimen: Nasopharyngeal Swab  Result Value Ref Range Status   SARS Coronavirus 2 NEGATIVE NEGATIVE Final    Comment: (NOTE) SARS-CoV-2 target nucleic acids are NOT DETECTED. The SARS-CoV-2 RNA is generally detectable in upper and lower respiratory specimens during the acute phase of infection. Negative results do not preclude SARS-CoV-2 infection, do not rule out co-infections with other pathogens, and should not be used as the sole basis for treatment or other patient management decisions. Negative results must  be combined with clinical observations, patient history, and epidemiological information. The expected result is Negative. Fact Sheet for Patients: SugarRoll.be Fact Sheet for Healthcare Providers: https://www.woods-mathews.com/ This test is not yet approved or cleared by the [place] FDA and  has been authorized for detection and/or diagnosis of SARS-CoV-2 by FDA under an Emergency Use Authorization (EUA). This EUA will remain  in effect (meaning this test can be used) for the duration of the COVID-19 declaration under Section 56 4(b)(1) of the Act, 21 U.S.C. section 360bbb-3(b)(1), unless the authorization is terminated or revoked sooner. Performed at [facility], [place] [address], [place], [place] [address]      Labs: BNP (last 3 results) No results for input(s): BNP in the last 8760 hours. Basic Metabolic Panel: Recent Labs  Lab 09/30/19 0209  NA 142  K 4.2  CL 106  CO2 23  GLUCOSE 111*  BUN 19  CREATININE 1.54*  CALCIUM 8.8*   Liver  Function Tests: Recent Labs  Lab 09/30/19 0209  AST 15  ALT 11  ALKPHOS 53  BILITOT 0.3  PROT 5.7*  ALBUMIN 3.4*   Recent Labs  Lab 09/30/19 0209  LIPASE 40   No results for input(s): AMMONIA in the last 168 hours. CBC: Recent Labs  Lab 09/30/19 0209  WBC 6.1  HGB 11.2*  HCT 33.5*  MCV 98.8  PLT 117*   Cardiac Enzymes: No results for input(s): CKTOTAL, CKMB, CKMBINDEX, TROPONINI in the last 168 hours. BNP: Invalid input(s): POCBNP CBG: No results for input(s): GLUCAP in the last 168 hours. D-Dimer Recent Labs    09/30/19 0209  DDIMER 0.34   Hgb A1c No results for input(s): HGBA1C in the last 72 hours. Lipid Profile Recent Labs    09/30/19 0755  CHOL 162  HDL 41  LDLCALC 111*  TRIG 48  CHOLHDL 4.0   Thyroid function studies No results for input(s): TSH, T4TOTAL, T3FREE, THYROIDAB in the last 72 hours.  Invalid input(s): FREET3 Anemia work up No  results for input(s): VITAMINB12, FOLATE, FERRITIN, TIBC, IRON, RETICCTPCT in the last 72 hours. Urinalysis    Component Value Date/Time   COLORURINE YELLOW 09/30/2019 0727   APPEARANCEUR CLEAR 09/30/2019 0727   LABSPEC 1.015 09/30/2019 0727   PHURINE 5.0 09/30/2019 0727   GLUCOSEU NEGATIVE 09/30/2019 0727   HGBUR NEGATIVE 09/30/2019 0727   BILIRUBINUR NEGATIVE 09/30/2019 0727   KETONESUR NEGATIVE 09/30/2019 0727   PROTEINUR NEGATIVE 09/30/2019 0727   UROBILINOGEN 0.2 01/03/2015 1330   NITRITE NEGATIVE 09/30/2019 0727   LEUKOCYTESUR NEGATIVE 09/30/2019 0727   Sepsis Labs Invalid input(s): PROCALCITONIN,  WBC,  LACTICIDVEN Microbiology Recent Results (from the past 240 hour(s))  SARS CORONAVIRUS 2 (TAT 6-24 HRS) Nasopharyngeal Nasopharyngeal Swab     Status: None   Collection Time: 09/30/19  3:53 AM   Specimen: Nasopharyngeal Swab  Result Value Ref Range Status   SARS Coronavirus 2 NEGATIVE NEGATIVE Final    Comment: (NOTE) SARS-CoV-2 target nucleic acids are NOT DETECTED. The SARS-CoV-2 RNA is generally detectable in upper and lower respiratory specimens during the acute phase of infection. Negative results do not preclude SARS-CoV-2 infection, do not rule out co-infections with other pathogens, and should not be used as the sole basis for treatment or other patient management decisions. Negative results must be combined with clinical observations, patient history, and epidemiological information. The expected result is Negative. Fact Sheet for Patients: SugarRoll.be Fact Sheet for Healthcare Providers: https://www.woods-mathews.com/ This test is not yet approved or cleared by the [place] FDA and  has been authorized for detection and/or diagnosis of SARS-CoV-2 by FDA under an Emergency Use Authorization (EUA). This EUA will remain  in effect (meaning this test can be used) for the duration of the COVID-19 declaration under  Section 56 4(b)(1) of the Act, 21 U.S.C. section 360bbb-3(b)(1), unless the authorization is terminated or revoked sooner. Performed at [facility], [place] [address], [place], [place] [address]      Time coordinating discharge:  30 minutes  SIGNED:   Barb Merino, MD  [facility] 09/30/2019, 3:30 PM

## 2019-09-30 NOTE — Progress Notes (Signed)
  Echocardiogram 2D Echocardiogram has been performed.  Bobbye Charleston 09/30/2019, 2:44 PM

## 2019-09-30 NOTE — ED Notes (Signed)
Lunch Tray Ordered @ 1111. 

## 2019-09-30 NOTE — ED Notes (Signed)
ECHO at bedside.

## 2019-09-30 NOTE — ED Provider Notes (Signed)
[facility] EMERGENCY DEPARTMENT Provider Note   CSN: [id number] Arrival date & time: 09/30/19  0146     History Chief Complaint  Patient presents with  . Chest Pain    NOE GLOD is a 86 y.o. male.   Chest Pain Associated symptoms: no fever, no nausea, no shortness of breath and no vomiting     HPI: A 86 year old patient with a history of hypertension presents for evaluation of chest pain. Initial onset of pain was approximately 1-3 hours ago. The patient's chest pain is described as heaviness/pressure/tightness and is not worse with exertion. The patient's chest pain is middle- or left-sided, is not well-localized, is not sharp and does not radiate to the arms/jaw/neck. The patient does not complain of nausea and denies diaphoresis. The patient has a family history of coronary artery disease in a first-degree relative with onset less than age 46. The patient has no history of stroke, has no history of peripheral artery disease, has not smoked in the past 90 days, denies any history of treated diabetes, has no history of hypercholesterolemia and does not have an elevated BMI (>=30).  Patient with history of hyperlipidemia, hypertension, CAD presents with chest pain.  Patient lives in assisted living.  He reports soon after eating dinner began having diffuse chest pain that was very severe.  He denies any associated symptoms, denies nausea/vomiting/shortness of breath.  He is now feeling improved Past Medical History:  Diagnosis Date  . Anemia   . Arthritis    arthritis,osteopenia,"spinal stenosis"  . Bladder neck obstruction 03/28/2011  . CAD (coronary artery disease)   . Cancer [facility]) 12-06-12   Prostate cancer'98  . Depression   . GERD (gastroesophageal reflux disease)   . H/O hiatal hernia   . H/O vertigo 12-06-12   none recent  . Hyperlipidemia   . IBS (irritable bowel syndrome)   . Neuropathy   . Osteopenia   . Peripheral neuropathy 12-06-12   peripheral neuropathy  . Rhinitis   . RLS (restless legs syndrome)   . Vitamin B12 deficiency     Patient Active Problem List   Diagnosis Date Noted  . CKD (chronic kidney disease) stage 3, GFR 30-59 ml/min 08/17/2017  . History of TIA (transient ischemic attack) 11/05/2016  . Vitamin B12 deficiency 11/05/2016  . GERD (gastroesophageal reflux disease) 11/05/2016  . Dementia ([place]) 08/20/2015  . Hereditary and idiopathic peripheral neuropathy 08/20/2015  . Benign essential tremor 08/20/2015  . Vitamin D deficiency 01/06/2015  . Medication management 01/06/2015  . Primary osteoarthritis of right knee 11/25/2014  . Malignant neoplasm of prostate ([place]) 03/28/2011  . INTERMITTENT VERTIGO 10/12/2009  . Mixed hyperlipidemia 04/10/2009  . Essential hypertension, benign 04/10/2009  . CORONARY ATHEROSCLEROSIS NATIVE CORONARY ARTERY 04/10/2009    Past Surgical History:  Procedure Laterality Date  . APPENDECTOMY    . CARPAL TUNNEL RELEASE     both hands  . cataract surgery Left 12-06-12   recent surgery  . CHOLECYSTECTOMY    . COLONOSCOPY WITH PROPOFOL N/A 12/25/2012   Procedure: COLONOSCOPY WITH PROPOFOL;  Surgeon: Garlan Fair, MD;  Location: [facility];  Service: Endoscopy;  Laterality: N/A;  . ESOPHAGOGASTRODUODENOSCOPY (EGD) WITH PROPOFOL N/A 12/25/2012   Procedure: ESOPHAGOGASTRODUODENOSCOPY (EGD) WITH PROPOFOL;  Surgeon: Garlan Fair, MD;  Location: [facility];  Service: Endoscopy;  Laterality: N/A;  . PROSTATE SURGERY  12-06-12  . TONSILLECTOMY    . TOTAL KNEE ARTHROPLASTY Right 11/25/2014   Procedure: RIGHT TOTAL KNEE ARTHROPLASTY;  Surgeon: Melrose Nakayama, MD;  Location: [facility];  Service: Orthopedics;  Laterality: Right;       Family History  Problem Relation Age of Onset  . CAD Father        Deceased, 56  . Dementia Mother        Deceased, 22  . Diabetes Brother   . Hyperlipidemia Brother   . Hypertension Brother   . Dementia Brother   . Colon cancer Neg Hx    . Colon polyps Neg Hx   . Kidney disease Neg Hx   . Esophageal cancer Neg Hx   . Gallbladder disease Neg Hx     Social History   Tobacco Use  . Smoking status: Former Smoker    Packs/day: 0.50    Years: 30.00    Pack years: 15.00    Quit date: 06/14/1963    Years since quitting: 56.3  . Smokeless tobacco: Never Used  Substance Use Topics  . Alcohol use: Yes    Alcohol/week: 0.0 standard drinks    Comment: Socially  . Drug use: No    Home Medications Prior to Admission medications   Medication Sig Start Date End Date Taking? Authorizing Provider  Cholecalciferol (VITAMIN D) 50 MCG (2000 UT) tablet Take 1 pill daily. 01/10/19   Unk Pinto, MD  gabapentin (NEURONTIN) 400 MG capsule Take 1 capsule 3 times a day for nerve pain. 09/10/19   Unk Pinto, MD  omeprazole (PRILOSEC) 40 MG capsule Takes 1 capsule Daily for  Indigestion & Heartburn 09/10/19   Unk Pinto, MD  propranolol (INDERAL) 20 MG tablet Take 1 tablet   2 x /day for BP 09/10/19   Unk Pinto, MD  sertraline (ZOLOFT) 100 MG tablet 1 tablet Daily for Mood 09/10/19 03/30/30  Unk Pinto, MD  simvastatin (ZOCOR) 20 MG tablet Take 1 tablet Daily for Cholesterol. 09/10/19   Unk Pinto, MD    Allergies    Niacin, Penicillins, and Tramadol  Review of Systems   Review of Systems  Constitutional: Negative for fever.  Respiratory: Negative for shortness of breath.   Cardiovascular: Positive for chest pain.  Gastrointestinal: Negative for nausea and vomiting.  All other systems reviewed and are negative.   Physical Exam Updated Vital Signs BP (!) 135/55 (BP Location: Left Arm)   Pulse 85   Temp 98.2 F (36.8 C) (Oral)   Resp 16   Ht 1.778 m (5\' 10" )   Wt 75.5 kg   SpO2 100%   BMI 23.88 kg/m   Physical Exam CONSTITUTIONAL: Elderly, no acute distress HEAD: Normocephalic/atraumatic EYES: EOMI ENMT: Mask in place NECK: supple no meningeal signs SPINE/BACK:entire spine nontender CV:  S1/S2 noted, no murmurs/rubs/gallops noted LUNGS: Lungs are clear to auscultation bilaterally, no apparent distress Chest-no chest wall tenderness ABDOMEN: soft, nontender, no rebound or guarding, bowel sounds noted throughout abdomen GU:no cva tenderness NEURO: Pt is awake/alert/appropriate, moves all extremitiesx4.  No facial droop.  EXTREMITIES: pulses normal/equalx4, full ROM, no calf tenderness or edema SKIN: warm, color normal PSYCH: no abnormalities of mood noted, alert and oriented to situation  ED Results / Procedures / Treatments   Labs (all labs ordered are listed, but only abnormal results are displayed) Labs Reviewed  BASIC METABOLIC PANEL - Abnormal; Notable for the following components:      Result Value   Glucose, Bld 111 (*)    Creatinine, Ser 1.54 (*)    Calcium 8.8 (*)    GFR calc non Af Amer 40 (*)  GFR calc Af Amer 46 (*)    All other components within normal limits  CBC - Abnormal; Notable for the following components:   RBC 3.39 (*)    Hemoglobin 11.2 (*)    HCT 33.5 (*)    Platelets 117 (*)    All other components within normal limits  SARS CORONAVIRUS 2 (TAT 6-24 HRS)  PROTIME-INR  TROPONIN I (HIGH SENSITIVITY)    EKG EKG Interpretation  Date/Time:  Monday September 30 2019 01:49:59 EDT Ventricular Rate:  81 PR Interval:  172 QRS Duration: 136 QT Interval:  406 QTC Calculation: 471 R Axis:   -37 Text Interpretation: Normal sinus rhythm Left axis deviation Right bundle branch block Minimal voltage criteria for LVH, may be normal variant ( R in aVL ) Septal infarct , age undetermined Possible Lateral infarct , age undetermined Abnormal ECG No significant change since last tracing Confirmed by Ripley Fraise [phone]) on 09/30/2019 2:00:38 AM   Radiology DG Chest 2 View  Result Date: 09/30/2019 CLINICAL DATA:  Chest pain for 2 days EXAM: CHEST - 2 VIEW COMPARISON:  Radiograph 03/17/2018 FINDINGS: Mild chronic interstitial thickening is similar to  comparison exams. Stable biapical pleuroparenchymal scarring. No consolidation, features of edema, pneumothorax, or effusion. The aorta is calcified. The remaining cardiomediastinal contours are unremarkable. No acute osseous or soft tissue abnormality. Multilevel degenerative changes are present in the imaged portions of the spine. Multilevel flowing anterior osteophytosis, compatible with features of diffuse idiopathic skeletal hyperostosis (DISH). Minimal degenerative changes in the shoulders. IMPRESSION: 1. Mild chronic interstitial thickening, unchanged from comparison exams. 2. No acute cardiopulmonary abnormality. Electronically Signed   By: Lovena Le M.D.   On: 09/30/2019 02:29    Procedures Procedures   Medications Ordered in ED Medications  sodium chloride flush (NS) 0.9 % injection 3 mL (has no administration in time range)    ED Course  I have reviewed the triage vital signs and the nursing notes.  Pertinent labs & imaging results that were available during my care of the patient were reviewed by me and considered in my medical decision making (see chart for details).    MDM Rules/Calculators/A&P HEAR Score: 6                    2:28 AM Presents for chest pain.  He reports he is chest pain-free at this time He received aspirin via EMS. Patient reports he does not have any health problems, but his chart lists history of CAD and hyperlipidemia.  Suspect patient has mild dementia. Work-up is pending at this time. 3:18 AM Initial troponin is unremarkable.  However due to age, risk factors and heart score, will admit for cardiac evaluation. Low suspicion for acute pulmonary ligament/aorta dissection at this time.   This patient presents to the ED for concern of chest pain, this involves an extensive number of treatment options, and is a complaint that carries with it a high risk of complications and morbidity.  The differential diagnosis includes ACS, pulmonary embolism,  aortic dissection   Lab Tests:   I Ordered, reviewed, and interpreted labs, which included troponin, complete blood count, metabolic panel    Imaging Studies ordered:   I ordered imaging studies which included chest x-ray   I independently visualized and interpreted imaging which showed no acute findings  Additional history obtained:     Previous records obtained and reviewed no recent catheterization noted  Consultations Obtained:   I consulted [facility] hospitalist Dr. Cyd Silence and discussed  lab and imaging findings  Reevaluation:  After the interventions stated above, I reevaluated the patient and found he is stable   Final Clinical Impression(s) / ED Diagnoses Final diagnoses:  Chest pain, rule out acute myocardial infarction  Renal insufficiency    Rx / DC Orders ED Discharge Orders    None       Ripley Fraise, MD 09/30/19 [phone]

## 2019-10-01 LAB — URINE CULTURE: Culture: NO GROWTH

## 2019-10-09 NOTE — Progress Notes (Signed)
   History of Present Illness:     This very nice 86 y.o. [place] residing at [facility] - currently Regino Ramirez who is brought in today by an out of town daughter - Ginger.   Patient has been followed with w/hx/o  HTN, CKD3b (GFR 35),  HLD, Prediabetes, Vascular Dementia, Lumbar Spinal Stenosis, Peripheral Sensory Neuropathy and AS Cerebrovascular Disease and Familial Tremor. and Vitamin D Deficiency.     Today he presents with concerns of his BM's  Ranging from constipation to periods of watery diarrhea for several days. No significant abdominal pains or cramping, N/V, or blood in BM's. He has not been on a regular regimen of high fiber diet or regular BM prep.      Medications  .  propranolol (INDERAL) 20 MG tablet, Take 1 tablet   2 x /day for BP (Patient taking differently: Take 20 mg by mouth 2 (two) times daily. ) .  simvastatin (ZOCOR) 20 MG tablet, Take 1 tablet Daily for Cholesterol. (Patient taking differently: Take 20 mg by mouth daily at 6 PM. ) .  Cholecalciferol (VITAMIN D) 50 MCG (2000 UT) tablet, Take 1 pill daily. (Patient taking differently: Take 2,000 Units by mouth daily. ) .  gabapentin (NEURONTIN) 400 MG capsule, Take 1 capsule 3 times a day for nerve pain. (Patient taking differently: Take 400 mg by mouth 3 (three) times daily. ) .  omeprazole (PRILOSEC) 40 MG capsule, Takes 1 capsule Daily for  Indigestion & Heartburn (Patient taking differently: Take 40 mg by mouth daily. ) .  sertraline (ZOLOFT) 100 MG tablet, 1 tablet Daily for Mood (Patient taking differently: Take 100 mg by mouth daily. )  Problem list He has Mixed hyperlipidemia; Essential hypertension, benign; CORONARY ATHEROSCLEROSIS NATIVE CORONARY ARTERY; INTERMITTENT VERTIGO; Primary osteoarthritis of right knee; Vitamin D deficiency; Medication management; Dementia ([place]); Hereditary and idiopathic peripheral neuropathy; Benign essential tremor; Malignant neoplasm of prostate ([place]); History of TIA  (transient ischemic attack); Vitamin B12 deficiency; GERD without esophagitis; CKD (chronic kidney disease) stage 3, GFR 30-59 ml/min; Chest pain; and Vascular dementia ([place]) on their problem list.   Observations/Objective:   BP 118/62   Pulse 72   Temp (!) 97 F (36.1 C)   Resp 16   Ht 5\' 10"  (1.778 m)   Wt 163 lb 3.2 oz (74 kg)   BMI 23.42 kg/m   HEENT - WNL. Neck - supple.  Chest - Clear equal BS. Cor - Nl HS. RRR w/o sig MGR. PP 1(+). No edema. Abd -  Soft , benigh , Non-tender . BSNl.  MS- FROM w/o deformities.  Gait Nl. Neuro -  Nl w/o focal abnormalities.  Assessment and Plan:  1. Irritable bowel syndrome with both constipation and diarrhea  - Discussed beginning  a daily high fiber supplement  And complimenting with a generic Miralax - -of reduced dose to maintain regularity.     I discussed the assessment and treatment plan with the patient & daughter. They were provided an opportunity to ask questions and all were answered. They agreed with the plan and demonstrated an understanding of the instructions.  Between 15-20 minutes of counseling, chart review, and critical decision making was performed     They were advised to call back or seek an in-person evaluation if the symptoms worsen or if the condition fails to improve as anticipated.   Kirtland Bouchard, MD

## 2019-10-10 ENCOUNTER — Ambulatory Visit (INDEPENDENT_AMBULATORY_CARE_PROVIDER_SITE_OTHER): Payer: Medicare Other | Admitting: Internal Medicine

## 2019-10-10 ENCOUNTER — Other Ambulatory Visit: Payer: Self-pay

## 2019-10-10 VITALS — BP 118/62 | HR 72 | Temp 97.0°F | Resp 16 | Ht 70.0 in | Wt 163.2 lb

## 2019-10-10 DIAGNOSIS — K582 Mixed irritable bowel syndrome: Secondary | ICD-10-CM

## 2019-10-10 DIAGNOSIS — I2510 Atherosclerotic heart disease of native coronary artery without angina pectoris: Secondary | ICD-10-CM

## 2019-10-12 ENCOUNTER — Encounter: Payer: Self-pay | Admitting: Internal Medicine

## 2019-10-12 NOTE — Patient Instructions (Signed)
Irritable Bowel Syndrome, Adult  Irritable bowel syndrome (IBS) is a group of symptoms that affects the organs responsible for digestion (gastrointestinal or GI tract). IBS is not one specific disease. To regulate how the GI tract works, the body sends signals back and forth between the intestines and the brain. If you have IBS, there may be a problem with these signals. As a result, the GI tract does not function normally. The intestines may become more sensitive and overreact to certain things. This may be especially true when you eat certain foods or when you are under stress. There are four types of IBS. These may be determined based on the consistency of your stool (feces):  IBS with diarrhea.  IBS with constipation.  Mixed IBS.  Unsubtyped IBS. It is important to know which type of IBS you have. Certain treatments are more likely to be helpful for certain types of IBS. What are the causes? The exact cause of IBS is not known. What increases the risk? You may have a higher risk for IBS if you:  Are male.  Are younger than 40.  Have a family history of IBS.  Have a mental health condition, such as depression, anxiety, or post-traumatic stress disorder.  Have had a bacterial infection of your GI tract. What are the signs or symptoms? Symptoms of IBS vary from person to person. The main symptom is abdominal pain or discomfort. Other symptoms usually include one or more of the following:  Diarrhea, constipation, or both.  Abdominal swelling or bloating.  Feeling full after eating a small or regular-sized meal.  Frequent gas.  Mucus in the stool.  A feeling of having more stool left after a bowel movement. Symptoms tend to come and go. They may be triggered by stress, mental health conditions, or certain foods. How is this diagnosed? This condition may be diagnosed based on a physical exam, your medical history, and your symptoms. You may have tests, such as:  Blood  tests.  Stool test.  X-rays.  CT scan.  Colonoscopy. This is a procedure in which your GI tract is viewed with a long, thin, flexible tube. How is this treated? There is no cure for IBS, but treatment can help relieve symptoms. Treatment depends on the type of IBS you have, and may include:  Changes to your diet, such as: ? Avoiding foods that cause symptoms. ? Drinking more water. ? Following a low-FODMAP (fermentable oligosaccharides, disaccharides, monosaccharides, and polyols) diet for up to 6 weeks, or as told by your health care provider. FODMAPs are sugars that are hard for some people to digest. ? Eating more fiber. ? Eating medium-sized meals at the same times every day.  Medicines. These may include: ? Fiber supplements, if you have constipation. ? Medicine to control diarrhea (antidiarrheal medicines). ? Medicine to help control muscle tightening (spasms) in your GI tract (antispasmodic medicines). ? Medicines to help with mental health conditions, such as antidepressants or tranquilizers.  Talk therapy or counseling.  Working with a diet and nutrition specialist (dietitian) to help create a food plan that is right for you.  Managing your stress. Follow these instructions at home: Eating and drinking  Eat a healthy diet.  Eat medium-sized meals at about the same time every day. Do not eat large meals.  Gradually eat more fiber-rich foods. These include whole grains, fruits, and vegetables. This may be especially helpful if you have IBS with constipation.  Eat a diet low in FODMAPs.  Drink enough   fluid to keep your urine pale yellow.  Keep a journal of foods that seem to trigger symptoms.  Avoid foods and drinks that: ? Contain added sugar. ? Make your symptoms worse. Dairy products, caffeinated drinks, and carbonated drinks can make symptoms worse for some people. General instructions  Take over-the-counter and prescription medicines and supplements only  as told by your health care provider.  Get enough exercise. Do at least 150 minutes of moderate-intensity exercise each week.  Manage your stress. Getting enough sleep and exercise can help you manage stress.  Keep all follow-up visits as told by your health care provider and therapist. This is important. Alcohol Use  Do not drink alcohol if: ? Your health care provider tells you not to drink. ? You are pregnant, may be pregnant, or are planning to become pregnant.  If you drink alcohol, limit how much you have: ? 0-1 drink a day for women. ? 0-2 drinks a day for men.  Be aware of how much alcohol is in your drink. In the [place] one drink equals one typical bottle of beer (12 oz), one-half glass of wine (5 oz), or one shot of hard liquor (1 oz). Contact a health care provider if you have:  Constant pain.  Weight loss.  Difficulty or pain when swallowing.  Diarrhea that gets worse. Get help right away if you have:  Severe abdominal pain.  Fever.  Diarrhea with symptoms of dehydration, such as dizziness or dry mouth.  Bright red blood in your stool.  Stool that is black and tarry.  Abdominal swelling.  Vomiting that does not stop.  Blood in your vomit. Summary  Irritable bowel syndrome (IBS) is not one specific disease. It is a group of symptoms that affects digestion.  Your intestines may become more sensitive and overreact to certain things. This may be especially true when you eat certain foods or when you are under stress.  There is no cure for IBS, but treatment can help relieve symptoms. ===============================================   Diet for Irritable Bowel Syndrome When you have irritable bowel syndrome (IBS), it is very important to eat the foods and follow the eating habits that are best for your condition. IBS may cause various symptoms such as pain in the abdomen, constipation, or diarrhea. Choosing the right foods can help to ease the discomfort  from these symptoms. Work with your health care provider and diet and nutrition specialist (dietitian) to find the eating plan that will help to control your symptoms. What are tips for following this plan?      Keep a food diary. This will help you identify foods that cause symptoms. Write down: ? What you eat and when you eat it. ? What symptoms you have. ? When symptoms occur in relation to your meals, such as "pain in abdomen 2 hours after dinner."  Eat your meals slowly and in a relaxed setting.  Aim to eat 5-6 small meals per day. Do not skip meals.  Drink enough fluid to keep your urine pale yellow.  Ask your health care provider if you should take an over-the-counter probiotic to help restore healthy bacteria in your gut (digestive tract). ? Probiotics are foods that contain good bacteria and yeasts.  Your dietitian may have specific dietary recommendations for you based on your symptoms. He or she may recommend that you: ? Avoid foods that cause symptoms. Talk with your dietitian about other ways to get the same nutrients that are in those problem foods. ?  Avoid foods with gluten. Gluten is a protein that is found in rye, wheat, and barley. ? Eat more foods that contain soluble fiber. Examples of foods with high soluble fiber include oats, seeds, and certain fruits and vegetables. Take a fiber supplement if directed by your dietitian. ? Reduce or avoid certain foods called FODMAPs. These are foods that contain carbohydrates that are hard to digest. Ask your doctor which foods contain these carbohydrates. What foods are not recommended? T he following are some foods and drinks that may make your symptoms worse:  Fatty foods, such as french fries.  Foods that contain gluten, such as pasta and cereal.  Dairy products, such as milk, cheese, and ice cream.  Chocolate.  Alcohol.  Products with caffeine, such as coffee.  Carbonated drinks, such as soda.  Foods that are  high in FODMAPs. These include certain fruits and vegetables.  Products with sweeteners such as honey, high fructose corn syrup, sorbitol, and mannitol. The items listed above may not be a complete list of foods and beverages you should avoid. Contact a dietitian for more information. What foods are good sources of fiber? Your health care provider or dietitian may recommend that you eat more foods that contain fiber. Fiber can help to reduce constipation and other IBS symptoms. Add foods with fiber to your diet a little at a time so your body can get used to them. Too much fiber at one time might cause gas and swelling of your abdomen. The following are some foods that are good sources of fiber:  Berries, such as raspberries, strawberries, and blueberries.  Tomatoes.  Carrots.  Brown rice.  Oats.  Seeds, such as chia and pumpkin seeds. The items listed above may not be a complete list of recommended sources of fiber. Contact your dietitian for more options.   Summary  When you have irritable bowel syndrome (IBS), it is very important to eat the foods and follow the eating habits that are best for your condition.  IBS may cause various symptoms such as pain in the abdomen, constipation, or diarrhea.  Choosing the right foods can help to ease the discomfort that comes from symptoms.  Keep a food diary. This will help you identify foods that cause symptoms.  Your health care provider or diet and nutrition specialist (dietitian) may recommend that you eat more foods that contain fiber.

## 2019-10-15 ENCOUNTER — Ambulatory Visit: Payer: Medicare Other | Admitting: Internal Medicine

## 2019-10-25 ENCOUNTER — Ambulatory Visit: Payer: Medicare Other | Admitting: Cardiology

## 2019-10-25 NOTE — Progress Notes (Deleted)
Cardiology Office Note:    Date:  10/25/2019   ID:  Ryan Humphrey, DOB [birth year], MRN [id number]  PCP:  Unk Pinto, MD  Cardiologist:  Elouise Munroe, MD  Electrophysiologist:  None   Referring MD: Barb Merino, MD     History of Present Illness:    Ryan Humphrey is a 86 y.o. male here for the evaluation of chest pain at the request of Dr. Sloan Leiter.  Was seen in the emergency department on 09/30/2019.  Reviewed records.  Had chest pain that was approximately 3 hours preceding his ER visit with heaviness pressure tightness that did not seem to be worse with exertional activity.  He was in the middle/left side of the chest but did not radiate.  No nausea no diaphoresis.  Has a family history of CAD with onset less than 30.  Patient had no history of stroke or peripheral vascular disease.  Currently lives in assisted living.  The pain occurred soon after eating dinner, severe.  No nausea or vomiting.  Past Medical History:  Diagnosis Date  . Anemia   . Arthritis    arthritis,osteopenia,"spinal stenosis"  . Bladder neck obstruction 03/28/2011  . CAD (coronary artery disease)   . Cancer [facility]) 12-06-12   Prostate cancer'98  . CKD (chronic kidney disease) stage 3, GFR 30-59 ml/min 08/17/2017  . Depression   . Essential hypertension, benign 04/10/2009  . GERD (gastroesophageal reflux disease)   . GERD without esophagitis 11/05/2016  . H/O hiatal hernia   . H/O vertigo 12-06-12   none recent  . Hyperlipidemia   . IBS (irritable bowel syndrome)   . Mixed hyperlipidemia 04/10/2009  . Neuropathy   . Osteopenia   . Peripheral neuropathy 12-06-12   peripheral neuropathy  . Rhinitis   . RLS (restless legs syndrome)   . Vascular dementia ([place]) 09/30/2019  . Vitamin B12 deficiency     Past Surgical History:  Procedure Laterality Date  . APPENDECTOMY    . CARPAL TUNNEL RELEASE     both hands  . cataract surgery Left 12-06-12   recent surgery  . CHOLECYSTECTOMY    .  COLONOSCOPY WITH PROPOFOL N/A 12/25/2012   Procedure: COLONOSCOPY WITH PROPOFOL;  Surgeon: Garlan Fair, MD;  Location: [facility];  Service: Endoscopy;  Laterality: N/A;  . ESOPHAGOGASTRODUODENOSCOPY (EGD) WITH PROPOFOL N/A 12/25/2012   Procedure: ESOPHAGOGASTRODUODENOSCOPY (EGD) WITH PROPOFOL;  Surgeon: Garlan Fair, MD;  Location: [facility];  Service: Endoscopy;  Laterality: N/A;  . PROSTATE SURGERY  12-06-12  . TONSILLECTOMY    . TOTAL KNEE ARTHROPLASTY Right 11/25/2014   Procedure: RIGHT TOTAL KNEE ARTHROPLASTY;  Surgeon: Melrose Nakayama, MD;  Location: [facility];  Service: Orthopedics;  Laterality: Right;    Current Medications: No outpatient medications have been marked as taking for the 10/25/19 encounter (Appointment) with Jerline Pain, MD.     Allergies:   Niacin, Penicillins, and Tramadol   Social History   Socioeconomic History  . Marital status: Widowed    Spouse name: Not on file  . Number of children: 2  . Years of education: College  . Highest education level: Not on file  Occupational History  . Occupation: Retired  Tobacco Use  . Smoking status: Former Smoker    Packs/day: 0.50    Years: 30.00    Pack years: 15.00    Quit date: 06/14/1963    Years since quitting: 56.4  . Smokeless tobacco: Never Used  Substance and Sexual Activity  . Alcohol  use: Yes    Alcohol/week: 0.0 standard drinks    Comment: Socially  . Drug use: No  . Sexual activity: Not Currently  Other Topics Concern  . Not on file  Social History Narrative   Pt lives at home alone, widowed in 2011, married for 55 years. They have two grown daugthers.   Plays the piano.   Caffeine Use: Rarely   Social Determinants of Health   Financial Resource Strain:   . Difficulty of Paying Living Expenses:   Food Insecurity:   . Worried About Charity fundraiser in the Last Year:   . Arboriculturist in the Last Year:   Transportation Needs:   . Film/video editor (Medical):   Marland Kitchen Lack of  Transportation (Non-Medical):   Physical Activity:   . Days of Exercise per Week:   . Minutes of Exercise per Session:   Stress:   . Feeling of Stress :   Social Connections:   . Frequency of Communication with Friends and Family:   . Frequency of Social Gatherings with Friends and Family:   . Attends Religious Services:   . Active Member of Clubs or Organizations:   . Attends Archivist Meetings:   Marland Kitchen Marital Status:      Family History: The patient's ***family history includes CAD in his father; Dementia in his brother and mother; Diabetes in his brother; Hyperlipidemia in his brother; Hypertension in his brother. There is no history of Colon cancer, Colon polyps, Kidney disease, Esophageal cancer, or Gallbladder disease.  ROS:   Please see the history of present illness.    *** All other systems reviewed and are negative.  EKGs/Labs/Other Studies Reviewed:    The following studies were reviewed today: ***  EKG: EKG performed on 09/30/2019 in the hospital showed right bundle branch block sinus rhythm with left axis deviation.  Echocardiogram was performed and showed normal ejection fraction.  Back in 2010 had cardiac catheterization with mild coronary artery disease.  Pharmacologic stress test 2016 showed no reversible ischemia.  Recent Labs: 09/10/2019: Magnesium 2.1; TSH 3.42 09/30/2019: ALT 11; BUN 19; Creatinine, Ser 1.54; Hemoglobin 11.2; Platelets 117; Potassium 4.2; Sodium 142  Recent Lipid Panel    Component Value Date/Time   CHOL 162 09/30/2019 0755   TRIG 48 09/30/2019 0755   HDL 41 09/30/2019 0755   CHOLHDL 4.0 09/30/2019 0755   VLDL 10 09/30/2019 0755   LDLCALC 111 (H) 09/30/2019 0755   LDLCALC 102 (H) 09/10/2019 1524    Physical Exam:    VS:  There were no vitals taken for this visit.    Wt Readings from Last 3 Encounters:  10/10/19 163 lb 3.2 oz (74 kg)  09/30/19 166 lb 6.4 oz (75.5 kg)  09/10/19 166 lb 6.4 oz (75.5 kg)     GEN: ***  Well nourished, well developed in no acute distress HEENT: Normal NECK: No JVD; No carotid bruits LYMPHATICS: No lymphadenopathy CARDIAC: ***RRR, no murmurs, rubs, gallops RESPIRATORY:  Clear to auscultation without rales, wheezing or rhonchi  ABDOMEN: Soft, non-tender, non-distended MUSCULOSKELETAL:  No edema; No deformity  SKIN: Warm and dry NEUROLOGIC:  Alert and oriented x 3 PSYCHIATRIC:  Normal affect   ASSESSMENT:    No diagnosis found. PLAN:    In order of problems listed above:  1. ***   Medication Adjustments/Labs and Tests Ordered: Current medicines are reviewed at length with the patient today.  Concerns regarding medicines are outlined above.  No orders of  the defined types were placed in this encounter.  No orders of the defined types were placed in this encounter.   There are no Patient Instructions on file for this visit.   Signed, Candee Furbish, MD  10/25/2019 8:58 AM    [facility]

## 2019-11-08 ENCOUNTER — Ambulatory Visit: Payer: Medicare Other | Admitting: Cardiovascular Disease

## 2019-11-11 NOTE — Progress Notes (Signed)
   History of Present Illness:      This very nice89 y.o.WWM residing at [facility] - returns for 1 month f/u.  Patient has hx/o  HTN, CKD3b (GFR 35),  HLD, Prediabetes, Vascular Dementia, Lumbar Spinal Stenosis, Peripheral Sensory Neuropathy and AS Cerebrovascular Disease and Familial Tremor.and Vitamin D Deficiency. Patient returns for f/u of abdominal pain and irreg BM pattern from extremes of constipation to diarrhea and was strongly encouraged increasing fiber in diet complimenting with Miralax in low dose. Patient reports problems "solved" with regular BM's.      Recent labs showed very mild drop in Hgb and Renal functions, so repeating today.   Medications  .  propranolol  20 MG tablet, Take 1 tablet   2 x /day for BP  .  simvastatin  20 MG tablet, Take 1 tablet Daily for Cholesterol. Marland Kitchen  VITAMIN D 50 MCG (2000 U, Take 1 pill daily .  gabapentin (NEURONTIN) 400 MG capsule, Take 1 capsule 3 times a day for nerve pain.  Marland Kitchen  omeprazole (PRILOSEC) 40 MG capsule, Takes 1 capsule Daily for  Indigestion & Heartburn  .  sertraline (ZOLOFT) 100 MG tablet, 1 tablet Daily for Mood  Problem list He has Mixed hyperlipidemia; Essential hypertension, benign; CORONARY ATHEROSCLEROSIS NATIVE CORONARY ARTERY; INTERMITTENT VERTIGO; Primary osteoarthritis of right knee; Vitamin D deficiency; Medication management; Dementia ([place]); Hereditary and idiopathic peripheral neuropathy; Benign essential tremor; Malignant neoplasm of prostate ([place]); History of TIA (transient ischemic attack); Vitamin B12 deficiency; GERD without esophagitis; CKD (chronic kidney disease) stage 3, GFR 30-59 ml/min; Chest pain; and Vascular dementia ([place]) on their problem list.   Observations/Objective:  Ht 5\' 10"  (1.778 m)   BMI 23.42 kg/m   HEENT - WNL. Neck - supple.  Chest - Clear equal BS. Cor - Nl HS. RRR w/o sig MGR. PP 1(+). No edema. MS- FROM w/o deformities.  Gait Nl. Neuro -  Nl w/o focal  abnormalities.  Assessment and Plan:  1. Essential hypertension, benign  - CBC with Differential/Platelet - COMPLETE METABOLIC PANEL WITH GFR  2. Stage 3b chronic kidney disease  - COMPLETE METABOLIC PANEL WITH GFR  3. Anemia   - CBC with Differential/Platelet  4. Medication management  - CBC with Differential/Platelet - COMPLETE METABOLIC PANEL WITH GFR       I discussed the assessment and treatment plan with the patient. The patient was provided an opportunity to ask questions and all were answered. The patient agreed with the plan and demonstrated an understanding of the instructions.       The patient was advised to call back or seek an in-person evaluation if the symptoms worsen or if the condition fails to improve as anticipated.   Kirtland Bouchard, MD

## 2019-11-12 ENCOUNTER — Ambulatory Visit (INDEPENDENT_AMBULATORY_CARE_PROVIDER_SITE_OTHER): Payer: Medicare Other | Admitting: Internal Medicine

## 2019-11-12 ENCOUNTER — Other Ambulatory Visit: Payer: Self-pay

## 2019-11-12 ENCOUNTER — Encounter: Payer: Self-pay | Admitting: Internal Medicine

## 2019-11-12 ENCOUNTER — Other Ambulatory Visit: Payer: Self-pay | Admitting: Internal Medicine

## 2019-11-12 VITALS — BP 122/74 | HR 84 | Temp 97.0°F | Resp 16 | Ht 70.0 in | Wt 166.0 lb

## 2019-11-12 DIAGNOSIS — Z79899 Other long term (current) drug therapy: Secondary | ICD-10-CM

## 2019-11-12 DIAGNOSIS — N1832 Chronic kidney disease, stage 3b: Secondary | ICD-10-CM | POA: Diagnosis not present

## 2019-11-12 DIAGNOSIS — I2510 Atherosclerotic heart disease of native coronary artery without angina pectoris: Secondary | ICD-10-CM | POA: Diagnosis not present

## 2019-11-12 DIAGNOSIS — D6489 Other specified anemias: Secondary | ICD-10-CM

## 2019-11-12 DIAGNOSIS — I10 Essential (primary) hypertension: Secondary | ICD-10-CM | POA: Diagnosis not present

## 2019-11-12 LAB — CBC WITH DIFFERENTIAL/PLATELET
Absolute Monocytes: 535 cells/uL (ref 200–950)
Basophils Absolute: 33 cells/uL (ref 0–200)
Basophils Relative: 0.5 %
Eosinophils Absolute: 79 cells/uL (ref 15–500)
Eosinophils Relative: 1.2 %
HCT: 33.6 % — ABNORMAL LOW (ref 38.5–50.0)
Hemoglobin: 11.4 g/dL — ABNORMAL LOW (ref 13.2–17.1)
Lymphs Abs: 924 cells/uL (ref 850–3900)
MCH: 33.2 pg — ABNORMAL HIGH (ref 27.0–33.0)
MCHC: 33.9 g/dL (ref 32.0–36.0)
MCV: 98 fL (ref 80.0–100.0)
MPV: 11.3 fL (ref 7.5–12.5)
Monocytes Relative: 8.1 %
Neutro Abs: 5029 cells/uL (ref 1500–7800)
Neutrophils Relative %: 76.2 %
Platelets: 123 10*3/uL — ABNORMAL LOW (ref 140–400)
RBC: 3.43 10*6/uL — ABNORMAL LOW (ref 4.20–5.80)
RDW: 12.4 % (ref 11.0–15.0)
Total Lymphocyte: 14 %
WBC: 6.6 10*3/uL (ref 3.8–10.8)

## 2019-11-12 LAB — COMPLETE METABOLIC PANEL WITH GFR
AG Ratio: 1.8 (calc) (ref 1.0–2.5)
ALT: 7 U/L — ABNORMAL LOW (ref 9–46)
AST: 11 U/L (ref 10–35)
Albumin: 4 g/dL (ref 3.6–5.1)
Alkaline phosphatase (APISO): 67 U/L (ref 35–144)
BUN/Creatinine Ratio: 16 (calc) (ref 6–22)
BUN: 27 mg/dL — ABNORMAL HIGH (ref 7–25)
CO2: 30 mmol/L (ref 20–32)
Calcium: 9.4 mg/dL (ref 8.6–10.3)
Chloride: 107 mmol/L (ref 98–110)
Creat: 1.67 mg/dL — ABNORMAL HIGH (ref 0.70–1.11)
GFR, Est African American: 42 mL/min/{1.73_m2} — ABNORMAL LOW (ref >=60)
GFR, Est Non African American: 36 mL/min/{1.73_m2} — ABNORMAL LOW (ref >=60)
Globulin: 2.2 g/dL (calc) (ref 1.9–3.7)
Glucose, Bld: 115 mg/dL — ABNORMAL HIGH (ref 65–99)
Potassium: 4.6 mmol/L (ref 3.5–5.3)
Sodium: 143 mmol/L (ref 135–146)
Total Bilirubin: 0.5 mg/dL (ref 0.2–1.2)
Total Protein: 6.2 g/dL (ref 6.1–8.1)

## 2020-02-25 ENCOUNTER — Ambulatory Visit: Payer: Medicare Other | Admitting: Adult Health

## 2020-02-25 NOTE — Progress Notes (Signed)
MEDICARE ANNUAL WELLNESS VISIT AND FOLLOW UP Assessment:    Medicare visits 1 year Over 30 minutes of exam, counseling, chart review, and critical decision making was performed  Essential hypertension, benign -well controlled -encouraged to monitor at home - call if any fatigue, dizziness -dash diet -exercise as tolerated - TSH   Atherosclerosis of native coronary artery of native heart without angina pectoris - Control blood pressure, cholesterol, glucose, increase exercise.   Dementia without behavioral disturbance -followed by neurology - Vascular vs alzheimer's, likely elements of both, didn't tolerate memory meds - continue with pill box - memory compensation techniques reviewed and provided on AWV - Encouraged to bring meds/supplements with him next visit  - will plan to continue with zoloft  - try stopping gabapentin   Hereditary and idiopathic peripheral neuropathy -followed by neurology, patient denies recent sx, try tapering off of gabapentin due to age and memory    Benign essential tremor -cont propranolol   Primary osteoarthritis of right knee -weight control -tylenol prn   Malignant neoplasm of prostate ([place]) -s/p TURP x 2 -monitoring PSAs annually via our office and remains low/stable - denies urinary symptoms  Mixed hyperlipidemia - on statin - Lipid panel  INTERMITTENT VERTIGO -cont meds prn  Vitamin D deficiency -cont Vit D   Medication management - CBC with Differential/Platelet - CMP/GFR    Future Appointments  Date Time Provider [place]  09/16/2020  3:00 PM Unk Pinto, MD [facility] None     Plan:   During the course of the visit the patient was educated and counseled about appropriate screening and preventive services including:    Pneumococcal vaccine   Influenza vaccine  Prevnar 13  Td vaccine  Screening electrocardiogram  Colorectal cancer screening  Diabetes screening  Glaucoma  screening  Nutrition counseling    Subjective:  Ryan Humphrey is a 86 y.o. male who presents for Medicare Annual Wellness Visit and 3 month follow up for HTN, hyperlipidemia, prediabetes, and vitamin D Def.   Daughter brought, drives to Dr's apointments, she is waiting in car.   He is now living at [facility] assisted [facility], they drive if needed but if they can't daughter drives. Meals are provided by them.  He is able to clean his room himself and complete ADLs. Daughter pays bills.  He manages his own medications. Rarely forgets to take per patient, uses pill box. Incorporates with AM and PM routines.    Patient has seen Dr Posey Pronto for Dementia - ? Vascular vs more recently was suggested possible Alzheimer's by neuropsych, but tried meds without benefit, patient has opted not to follow up. He feels sx are stable, no concerns. He writes things down to help him remember.   Also followed for Lumbar Sinal Stenosis, peripheral sensory Neuropathy and AS Cerebrovascular Disease and Familial Tremor. He had ? TIA in 08/2018, CT and MRI showed no changes, RLE parasthesias resolved spontaneously, was advised restarted on bASA  He has been on zoloft for many years for mood.   BMI is Body mass index is 22.79 kg/m., he has not been working on diet, good appetitie. Does walk some around compound daily, 30 min most days Wt Readings from Last 3 Encounters:  02/26/20 158 lb 12.8 oz (72 kg)  11/12/19 166 lb (75.3 kg)  10/10/19 163 lb 3.2 oz (74 kg)   He has not been checking BPs at home, today their BP is BP: 116/60 He does not workout. He denies chest pain, shortness of breath,  dizziness.   Simvastatin 20 mg daily  He is on cholesterol medication and denies myalgias. His cholesterol is at goal. The cholesterol last visit was:   Lab Results  Component Value Date   CHOL 162 09/30/2019   HDL 41 09/30/2019   LDLCALC 111 (H) 09/30/2019   TRIG 48 09/30/2019   CHOLHDL 4.0 09/30/2019   Patient  does not have a history of diabetes or prediabetes, however he has neuropathy and is on gabapentin. Has seen neuro.  He continue to eat a healthy diet.   Lab Results  Component Value Date   HGBA1C 5.1 09/10/2019   He has CKD IIIb monitored at this office, admits to poor fluid intake. Last GFR Lab Results  Component Value Date   GFRNONAA 36 (L) 11/12/2019    Patient is on Vitamin D supplement.   Lab Results  Component Value Date   VD25OH 11 09/10/2019     He has hx of B12 deficiency, reports has been taking daily B12 for years;  Lab Results  Component Value Date   VITAMINB12 >2,000 (H) 09/10/2019   He has a history of prostate cancer s/p TURP, monitoring PSAs annually via our office which have been trending down, denies any urinary symptoms. Lab Results  Component Value Date   PSA 0.9 09/10/2019   PSA 1.1 07/26/2018   PSA 1.2 05/23/2017     Medication Review: Current Outpatient Medications on File Prior to Visit  Medication Sig Dispense Refill   Cholecalciferol (VITAMIN D) 50 MCG (2000 UT) tablet Take 1 pill daily. (Patient taking differently: Take 2,000 Units by mouth daily. ) 360 tablet 0   gabapentin (NEURONTIN) 400 MG capsule Take 1 capsule 3 times a day for nerve pain. (Patient taking differently: Take 400 mg by mouth 3 (three) times daily. ) 270 capsule 3   omeprazole (PRILOSEC) 40 MG capsule Takes 1 capsule Daily for  Indigestion & Heartburn (Patient taking differently: Take 40 mg by mouth daily. ) 90 capsule 3   propranolol (INDERAL) 20 MG tablet Take 1 tablet   2 x /day for BP (Patient taking differently: Take 20 mg by mouth 2 (two) times daily. ) 180 tablet 3   sertraline (ZOLOFT) 100 MG tablet 1 tablet Daily for Mood (Patient taking differently: Take 100 mg by mouth daily. ) 90 tablet 3   simvastatin (ZOCOR) 20 MG tablet Take 1 tablet Daily for Cholesterol. (Patient taking differently: Take 20 mg by mouth daily at 6 PM. ) 90 tablet 3   No current  facility-administered medications on file prior to visit.    Allergies: Allergies  Allergen Reactions   Niacin Itching   Penicillins Other (See Comments)    "rash"    Tramadol Itching    Current Problems (verified) has Mixed hyperlipidemia; Essential hypertension, benign; CORONARY ATHEROSCLEROSIS NATIVE CORONARY ARTERY; INTERMITTENT VERTIGO; Primary osteoarthritis of right knee; Vitamin D deficiency; Medication management; Hereditary and idiopathic peripheral neuropathy; Benign essential tremor; Malignant neoplasm of prostate ([place]); History of TIA (transient ischemic attack); Vitamin B12 deficiency; GERD without esophagitis; CKD (chronic kidney disease) stage 3, GFR 30-59 ml/min; and Alzheimer's dementia without behavioral disturbance ([place]) on their problem list.  Screening Tests Immunization History  Administered Date(s) Administered   Influenza, High Dose Seasonal PF 03/05/2015, 03/05/2018   Influenza-Unspecified 04/22/2016, 03/13/2017, 06/13/2018   Pneumococcal Conjugate-13 05/23/2017   Pneumococcal Polysaccharide-23 01/20/2016    Preventative care: Last colonoscopy: 2014, DONE  CXR: 09/2019, mild chronic interstitial thickening, unchanged from previous  Prior vaccinations: TD or  Tdap: Declined, cost Influenza: 2020 - will get at living facility  Pneumococcal: 2016 Prevnar13: 2018 Shingles/Zostavax: Declined Covid 19: believes he had- will check  Names of Other Physician/Practitioners you currently use: 1. [facility] here for primary care 2. [facility], eye doctor, last visit 2016, ? Unsure of exact date, states not having any problems 3. [facility], dentist, last visit 2019   Patient Care Team: Unk Pinto, MD as PCP - General (Internal Medicine) Elouise Munroe, MD as PCP - Cardiology (Cardiology)  Surgical: He  has a past surgical history that includes Appendectomy; Cholecystectomy; Carpal  tunnel release; Prostate surgery (12-06-12); cataract surgery (Left, 12-06-12); Esophagogastroduodenoscopy (egd) with propofol (N/A, 12/25/2012); Colonoscopy with propofol (N/A, 12/25/2012); Tonsillectomy; and Total knee arthroplasty (Right, 11/25/2014). Family His family history includes CAD in his father; Dementia in his brother and mother; Diabetes in his brother; Hyperlipidemia in his brother; Hypertension in his brother. Social history  He reports that he quit smoking about 56 years ago. He has a 15.00 pack-year smoking history. He has never used smokeless tobacco. He reports current alcohol use. He reports that he does not use drugs.  MEDICARE WELLNESS OBJECTIVES: Physical activity: Current Exercise Habits: Home exercise routine, Type of exercise: walking, Time (Minutes): 30, Frequency (Times/Week): 7, Weekly Exercise (Minutes/Week): 210, Intensity: Mild, Exercise limited by: None identified Cardiac risk factors: Cardiac Risk Factors include: dyslipidemia;hypertension;advanced age (>3men, >38 women);smoking/ tobacco exposure Depression/mood screen:   Depression screen [facility] 2/9 02/26/2020  Decreased Interest 0  Down, Depressed, Hopeless 0  PHQ - 2 Score 0  Altered sleeping 0  Tired, decreased energy 0  Change in appetite 0  Feeling bad or failure about yourself  0  Trouble concentrating 0  Moving slowly or fidgety/restless 0  Suicidal thoughts 0  PHQ-9 Score 0  Difficult doing work/chores Not difficult at all    ADLs:  In your present state of health, do you have any difficulty performing the following activities: 02/26/2020 09/09/2019  Hearing? N N  Comment hearing aids work well -  Vision? N N  Difficulty concentrating or making decisions? Y N  Comment chronic, stable -  Walking or climbing stairs? N N  Dressing or bathing? N N  Doing errands, shopping? Y N  Comment no longer drives, daughter helps -  Conservation officer, nature and eating ? N -  Comment provided at assiste living -  Using the  Toilet? N -  In the past six months, have you accidently leaked urine? N -  Do you have problems with loss of bowel control? N -  Managing your Medications? N -  Managing your Finances? N -  Comment daughter handles -  Housekeeping or managing your Housekeeping? N -  Some recent data might be hidden    Cognitive Testing  Follows neuro  Word recall 0/3 today - hearing impaired by mask may be contributing   EOL planning: Does Patient Have a Medical Advance Directive?: Yes Type of Advance Directive: [place] will Does patient want to make changes to medical advance directive?: No - Patient declined Copy of [place] in Chart?: No - copy requested   Objective:   Today's Vitals   02/26/20 1542  BP: 116/60  Pulse: 77  Resp: 16  Temp: (!) 97.3 F (36.3 C)  SpO2: 99%  Weight: 158 lb 12.8 oz (72 kg)  Height: 5\' 10"  (1.778 m)   Body mass index is 22.79 kg/m.  General appearance: alert, no distress, WD/WN, male HEENT: normocephalic, sclerae anicteric, TMs pearly, nares patent, no discharge or erythema, pharynx normal, HOH, bil hearing aids Oral cavity: MMM, no lesions Neck: supple, no lymphadenopathy, no thyromegaly, no masses Heart: RRR, normal S1, S2, no murmurs Lungs: CTA bilaterally, no wheezes, rhonchi, or rales Abdomen: +bs, soft, non tender, non distended, no masses, no hepatomegaly, no splenomegaly Musculoskeletal: nontender, no swelling, no obvious deformity Extremities: no edema, no cyanosis, no clubbing Pulses: 2+ symmetric, upper and lower extremities, normal cap refill Neurological: alert, oriented x 3, CN2-12 intact, strength normal upper extremities and decreased strength bilateral lower extremities, sensation normal throughout, DTRs 2+ throughout, no cerebellar signs, gait slow but steady with 4 point cane, poor short term recall, repeats questions after a few min, though judgement is appropriate Psychiatric: normal  affect, behavior normal, pleasant   Medicare Attestation I have personally reviewed: The patient's medical and social history Their use of alcohol, tobacco or illicit drugs Their current medications and supplements The patient's functional ability including ADLs,fall risks, home safety risks, cognitive, and hearing and visual impairment Diet and physical activities Evidence for depression or mood disorders  The patient's weight, height, BMI, and visual acuity have been recorded in the chart.  I have made referrals, counseling, and provided education to the patient based on review of the above and I have provided the patient with a written personalized care plan for preventive services.     Izora Ribas, NP   02/26/2020

## 2020-02-26 ENCOUNTER — Encounter: Payer: Self-pay | Admitting: Adult Health

## 2020-02-26 ENCOUNTER — Ambulatory Visit: Payer: Medicare Other | Admitting: Adult Health

## 2020-02-26 ENCOUNTER — Other Ambulatory Visit: Payer: Self-pay

## 2020-02-26 ENCOUNTER — Ambulatory Visit (INDEPENDENT_AMBULATORY_CARE_PROVIDER_SITE_OTHER): Payer: Medicare Other | Admitting: Adult Health

## 2020-02-26 VITALS — BP 116/60 | HR 77 | Temp 97.3°F | Resp 16 | Ht 70.0 in | Wt 158.8 lb

## 2020-02-26 DIAGNOSIS — F0280 Dementia in other diseases classified elsewhere without behavioral disturbance: Secondary | ICD-10-CM

## 2020-02-26 DIAGNOSIS — G301 Alzheimer's disease with late onset: Secondary | ICD-10-CM

## 2020-02-26 DIAGNOSIS — E782 Mixed hyperlipidemia: Secondary | ICD-10-CM

## 2020-02-26 DIAGNOSIS — E559 Vitamin D deficiency, unspecified: Secondary | ICD-10-CM

## 2020-02-26 DIAGNOSIS — M1711 Unilateral primary osteoarthritis, right knee: Secondary | ICD-10-CM

## 2020-02-26 DIAGNOSIS — Z8673 Personal history of transient ischemic attack (TIA), and cerebral infarction without residual deficits: Secondary | ICD-10-CM

## 2020-02-26 DIAGNOSIS — R6889 Other general symptoms and signs: Secondary | ICD-10-CM | POA: Diagnosis not present

## 2020-02-26 DIAGNOSIS — R42 Dizziness and giddiness: Secondary | ICD-10-CM | POA: Diagnosis not present

## 2020-02-26 DIAGNOSIS — I10 Essential (primary) hypertension: Secondary | ICD-10-CM | POA: Diagnosis not present

## 2020-02-26 DIAGNOSIS — N183 Chronic kidney disease, stage 3 unspecified: Secondary | ICD-10-CM

## 2020-02-26 DIAGNOSIS — I2510 Atherosclerotic heart disease of native coronary artery without angina pectoris: Secondary | ICD-10-CM

## 2020-02-26 DIAGNOSIS — G609 Hereditary and idiopathic neuropathy, unspecified: Secondary | ICD-10-CM

## 2020-02-26 DIAGNOSIS — C61 Malignant neoplasm of prostate: Secondary | ICD-10-CM

## 2020-02-26 DIAGNOSIS — K219 Gastro-esophageal reflux disease without esophagitis: Secondary | ICD-10-CM | POA: Diagnosis not present

## 2020-02-26 DIAGNOSIS — G250 Essential tremor: Secondary | ICD-10-CM | POA: Diagnosis not present

## 2020-02-26 DIAGNOSIS — Z0001 Encounter for general adult medical examination with abnormal findings: Secondary | ICD-10-CM

## 2020-02-26 DIAGNOSIS — Z0000 Encounter for general adult medical examination without abnormal findings: Secondary | ICD-10-CM

## 2020-02-26 DIAGNOSIS — Z79899 Other long term (current) drug therapy: Secondary | ICD-10-CM | POA: Diagnosis not present

## 2020-02-26 DIAGNOSIS — E538 Deficiency of other specified B group vitamins: Secondary | ICD-10-CM

## 2020-02-26 NOTE — Patient Instructions (Addendum)
Goals    . DIET - INCREASE WATER INTAKE     Aim for 65+ fluid ounces daily        Please check to see if you had covid 19 vaccine - please message/call back with the information if you did.   Please get flu vaccine in October   Please try to increase to 5-6 glasses of water every day Try keeping a log to keep track of how much you have had    Please try stopping gabapentin -  This is a medication for pain, can take as needed, but may cause sedation and make memory worse  If no pain after being off of the medication - please call or message so we can take off of the med list  If you do start having pain, we can try restarting but maybe at a lower dose    Use a dropper or use a cap to put peroxide, olive oil,mineral oil or canola oil in  ears- 2-3 times a week. Let it soak for 20-30 min then you can take a shower or use a baby bulb with warm water to wash out the ear wax.  Can buy debrox wax removal kit over the counter.  Do not use Qtips

## 2020-02-27 LAB — MAGNESIUM: Magnesium: 2.3 mg/dL (ref 1.5–2.5)

## 2020-02-27 LAB — CBC WITH DIFFERENTIAL/PLATELET
Absolute Monocytes: 756 cells/uL (ref 200–950)
Basophils Absolute: 42 cells/uL (ref 0–200)
Basophils Relative: 0.5 %
Eosinophils Absolute: 143 cells/uL (ref 15–500)
Eosinophils Relative: 1.7 %
HCT: 38.4 % — ABNORMAL LOW (ref 38.5–50.0)
Hemoglobin: 13 g/dL — ABNORMAL LOW (ref 13.2–17.1)
Lymphs Abs: 1184 cells/uL (ref 850–3900)
MCH: 32.8 pg (ref 27.0–33.0)
MCHC: 33.9 g/dL (ref 32.0–36.0)
MCV: 97 fL (ref 80.0–100.0)
MPV: 11.1 fL (ref 7.5–12.5)
Monocytes Relative: 9 %
Neutro Abs: 6275 cells/uL (ref 1500–7800)
Neutrophils Relative %: 74.7 %
Platelets: 145 10*3/uL (ref 140–400)
RBC: 3.96 10*6/uL — ABNORMAL LOW (ref 4.20–5.80)
RDW: 12.6 % (ref 11.0–15.0)
Total Lymphocyte: 14.1 %
WBC: 8.4 10*3/uL (ref 3.8–10.8)

## 2020-02-27 LAB — COMPLETE METABOLIC PANEL WITH GFR
AG Ratio: 2 (calc) (ref 1.0–2.5)
ALT: 8 U/L — ABNORMAL LOW (ref 9–46)
AST: 13 U/L (ref 10–35)
Albumin: 4.4 g/dL (ref 3.6–5.1)
Alkaline phosphatase (APISO): 66 U/L (ref 35–144)
BUN/Creatinine Ratio: 16 (calc) (ref 6–22)
BUN: 29 mg/dL — ABNORMAL HIGH (ref 7–25)
CO2: 30 mmol/L (ref 20–32)
Calcium: 9.6 mg/dL (ref 8.6–10.3)
Chloride: 106 mmol/L (ref 98–110)
Creat: 1.84 mg/dL — ABNORMAL HIGH (ref 0.70–1.11)
GFR, Est African American: 37 mL/min/{1.73_m2} — ABNORMAL LOW (ref >=60)
GFR, Est Non African American: 32 mL/min/{1.73_m2} — ABNORMAL LOW (ref >=60)
Globulin: 2.2 g/dL (calc) (ref 1.9–3.7)
Glucose, Bld: 113 mg/dL — ABNORMAL HIGH (ref 65–99)
Potassium: 5 mmol/L (ref 3.5–5.3)
Sodium: 143 mmol/L (ref 135–146)
Total Bilirubin: 0.6 mg/dL (ref 0.2–1.2)
Total Protein: 6.6 g/dL (ref 6.1–8.1)

## 2020-02-27 LAB — LIPID PANEL
Cholesterol: 171 mg/dL (ref <200)
HDL: 41 mg/dL (ref >=40)
LDL Cholesterol (Calc): 104 mg/dL (calc) — ABNORMAL HIGH
Non-HDL Cholesterol (Calc): 130 mg/dL (calc) — ABNORMAL HIGH (ref <130)
Total CHOL/HDL Ratio: 4.2 (calc) (ref <5.0)
Triglycerides: 151 mg/dL — ABNORMAL HIGH (ref <150)

## 2020-02-27 LAB — TSH: TSH: 2.42 mIU/L (ref 0.40–4.50)

## 2020-03-11 ENCOUNTER — Emergency Department (HOSPITAL_COMMUNITY): Payer: Medicare Other

## 2020-03-11 ENCOUNTER — Other Ambulatory Visit: Payer: Self-pay

## 2020-03-11 ENCOUNTER — Inpatient Hospital Stay (HOSPITAL_COMMUNITY)
Admission: EM | Admit: 2020-03-11 | Discharge: 2020-03-16 | DRG: 247 | Disposition: A | Discharge status: Nursing Home (Includes ICF) | Payer: Medicare Other | Source: Skilled Nursing Facility | Attending: Cardiovascular Disease | Admitting: Cardiovascular Disease

## 2020-03-11 ENCOUNTER — Encounter (HOSPITAL_COMMUNITY): Payer: Self-pay | Admitting: Emergency Medicine

## 2020-03-11 DIAGNOSIS — I129 Hypertensive chronic kidney disease with stage 1 through stage 4 chronic kidney disease, or unspecified chronic kidney disease: Secondary | ICD-10-CM | POA: Diagnosis present

## 2020-03-11 DIAGNOSIS — E538 Deficiency of other specified B group vitamins: Secondary | ICD-10-CM | POA: Diagnosis present

## 2020-03-11 DIAGNOSIS — Z20822 Contact with and (suspected) exposure to covid-19: Secondary | ICD-10-CM | POA: Diagnosis present

## 2020-03-11 DIAGNOSIS — G2581 Restless legs syndrome: Secondary | ICD-10-CM | POA: Diagnosis present

## 2020-03-11 DIAGNOSIS — Z885 Allergy status to narcotic agent status: Secondary | ICD-10-CM

## 2020-03-11 DIAGNOSIS — Z7982 Long term (current) use of aspirin: Secondary | ICD-10-CM

## 2020-03-11 DIAGNOSIS — I10 Essential (primary) hypertension: Secondary | ICD-10-CM | POA: Diagnosis present

## 2020-03-11 DIAGNOSIS — I2510 Atherosclerotic heart disease of native coronary artery without angina pectoris: Secondary | ICD-10-CM | POA: Diagnosis present

## 2020-03-11 DIAGNOSIS — E782 Mixed hyperlipidemia: Secondary | ICD-10-CM | POA: Diagnosis not present

## 2020-03-11 DIAGNOSIS — Z8673 Personal history of transient ischemic attack (TIA), and cerebral infarction without residual deficits: Secondary | ICD-10-CM

## 2020-03-11 DIAGNOSIS — R079 Chest pain, unspecified: Secondary | ICD-10-CM | POA: Diagnosis not present

## 2020-03-11 DIAGNOSIS — Z87891 Personal history of nicotine dependence: Secondary | ICD-10-CM

## 2020-03-11 DIAGNOSIS — F0280 Dementia in other diseases classified elsewhere without behavioral disturbance: Secondary | ICD-10-CM | POA: Diagnosis present

## 2020-03-11 DIAGNOSIS — Z833 Family history of diabetes mellitus: Secondary | ICD-10-CM

## 2020-03-11 DIAGNOSIS — Z888 Allergy status to other drugs, medicaments and biological substances status: Secondary | ICD-10-CM

## 2020-03-11 DIAGNOSIS — N183 Chronic kidney disease, stage 3 unspecified: Secondary | ICD-10-CM | POA: Diagnosis present

## 2020-03-11 DIAGNOSIS — N179 Acute kidney failure, unspecified: Secondary | ICD-10-CM | POA: Diagnosis not present

## 2020-03-11 DIAGNOSIS — G309 Alzheimer's disease, unspecified: Secondary | ICD-10-CM | POA: Diagnosis not present

## 2020-03-11 DIAGNOSIS — N1832 Chronic kidney disease, stage 3b: Secondary | ICD-10-CM | POA: Diagnosis present

## 2020-03-11 DIAGNOSIS — Z8249 Family history of ischemic heart disease and other diseases of the circulatory system: Secondary | ICD-10-CM

## 2020-03-11 DIAGNOSIS — Z955 Presence of coronary angioplasty implant and graft: Secondary | ICD-10-CM

## 2020-03-11 DIAGNOSIS — R404 Transient alteration of awareness: Secondary | ICD-10-CM | POA: Diagnosis not present

## 2020-03-11 DIAGNOSIS — G629 Polyneuropathy, unspecified: Secondary | ICD-10-CM | POA: Diagnosis present

## 2020-03-11 DIAGNOSIS — Z880 Allergy status to penicillin: Secondary | ICD-10-CM

## 2020-03-11 DIAGNOSIS — Z8546 Personal history of malignant neoplasm of prostate: Secondary | ICD-10-CM

## 2020-03-11 DIAGNOSIS — R0789 Other chest pain: Secondary | ICD-10-CM | POA: Diagnosis not present

## 2020-03-11 DIAGNOSIS — K219 Gastro-esophageal reflux disease without esophagitis: Secondary | ICD-10-CM | POA: Diagnosis present

## 2020-03-11 DIAGNOSIS — I214 Non-ST elevation (NSTEMI) myocardial infarction: Principal | ICD-10-CM | POA: Diagnosis present

## 2020-03-11 DIAGNOSIS — I351 Nonrheumatic aortic (valve) insufficiency: Secondary | ICD-10-CM | POA: Diagnosis present

## 2020-03-11 DIAGNOSIS — Z79810 Long term (current) use of selective estrogen receptor modulators (SERMs): Secondary | ICD-10-CM

## 2020-03-11 DIAGNOSIS — I77810 Thoracic aortic ectasia: Secondary | ICD-10-CM | POA: Diagnosis present

## 2020-03-11 DIAGNOSIS — Z83438 Family history of other disorder of lipoprotein metabolism and other lipidemia: Secondary | ICD-10-CM

## 2020-03-11 DIAGNOSIS — R202 Paresthesia of skin: Secondary | ICD-10-CM | POA: Diagnosis not present

## 2020-03-11 DIAGNOSIS — I4510 Unspecified right bundle-branch block: Secondary | ICD-10-CM | POA: Diagnosis not present

## 2020-03-11 NOTE — ED Triage Notes (Signed)
[facility] c/o CP and pain all over. Pt frequently calls EMS for same. Pt frequently forgets to take home meds and once meds are taken pain is gone. Pt currently in no pain.

## 2020-03-12 ENCOUNTER — Inpatient Hospital Stay (HOSPITAL_COMMUNITY): Payer: Medicare Other

## 2020-03-12 DIAGNOSIS — M2550 Pain in unspecified joint: Secondary | ICD-10-CM | POA: Diagnosis not present

## 2020-03-12 DIAGNOSIS — Z87891 Personal history of nicotine dependence: Secondary | ICD-10-CM | POA: Diagnosis not present

## 2020-03-12 DIAGNOSIS — G301 Alzheimer's disease with late onset: Secondary | ICD-10-CM | POA: Diagnosis not present

## 2020-03-12 DIAGNOSIS — R4182 Altered mental status, unspecified: Secondary | ICD-10-CM | POA: Diagnosis not present

## 2020-03-12 DIAGNOSIS — Z7982 Long term (current) use of aspirin: Secondary | ICD-10-CM | POA: Diagnosis not present

## 2020-03-12 DIAGNOSIS — G309 Alzheimer's disease, unspecified: Secondary | ICD-10-CM | POA: Diagnosis not present

## 2020-03-12 DIAGNOSIS — M6281 Muscle weakness (generalized): Secondary | ICD-10-CM | POA: Diagnosis not present

## 2020-03-12 DIAGNOSIS — I10 Essential (primary) hypertension: Secondary | ICD-10-CM | POA: Diagnosis not present

## 2020-03-12 DIAGNOSIS — E785 Hyperlipidemia, unspecified: Secondary | ICD-10-CM

## 2020-03-12 DIAGNOSIS — E782 Mixed hyperlipidemia: Secondary | ICD-10-CM | POA: Diagnosis not present

## 2020-03-12 DIAGNOSIS — Z8673 Personal history of transient ischemic attack (TIA), and cerebral infarction without residual deficits: Secondary | ICD-10-CM | POA: Diagnosis not present

## 2020-03-12 DIAGNOSIS — F339 Major depressive disorder, recurrent, unspecified: Secondary | ICD-10-CM | POA: Diagnosis not present

## 2020-03-12 DIAGNOSIS — R2681 Unsteadiness on feet: Secondary | ICD-10-CM | POA: Diagnosis not present

## 2020-03-12 DIAGNOSIS — I214 Non-ST elevation (NSTEMI) myocardial infarction: Secondary | ICD-10-CM | POA: Diagnosis not present

## 2020-03-12 DIAGNOSIS — I2510 Atherosclerotic heart disease of native coronary artery without angina pectoris: Secondary | ICD-10-CM

## 2020-03-12 DIAGNOSIS — Z955 Presence of coronary angioplasty implant and graft: Secondary | ICD-10-CM | POA: Diagnosis not present

## 2020-03-12 DIAGNOSIS — I351 Nonrheumatic aortic (valve) insufficiency: Secondary | ICD-10-CM

## 2020-03-12 DIAGNOSIS — I129 Hypertensive chronic kidney disease with stage 1 through stage 4 chronic kidney disease, or unspecified chronic kidney disease: Secondary | ICD-10-CM | POA: Diagnosis not present

## 2020-03-12 DIAGNOSIS — Z833 Family history of diabetes mellitus: Secondary | ICD-10-CM | POA: Diagnosis not present

## 2020-03-12 DIAGNOSIS — Z7401 Bed confinement status: Secondary | ICD-10-CM | POA: Diagnosis not present

## 2020-03-12 DIAGNOSIS — I499 Cardiac arrhythmia, unspecified: Secondary | ICD-10-CM | POA: Diagnosis not present

## 2020-03-12 DIAGNOSIS — G2581 Restless legs syndrome: Secondary | ICD-10-CM | POA: Diagnosis present

## 2020-03-12 DIAGNOSIS — E538 Deficiency of other specified B group vitamins: Secondary | ICD-10-CM | POA: Diagnosis present

## 2020-03-12 DIAGNOSIS — F0280 Dementia in other diseases classified elsewhere without behavioral disturbance: Secondary | ICD-10-CM | POA: Diagnosis not present

## 2020-03-12 DIAGNOSIS — R41841 Cognitive communication deficit: Secondary | ICD-10-CM | POA: Diagnosis not present

## 2020-03-12 DIAGNOSIS — R29898 Other symptoms and signs involving the musculoskeletal system: Secondary | ICD-10-CM | POA: Diagnosis not present

## 2020-03-12 DIAGNOSIS — Z79810 Long term (current) use of selective estrogen receptor modulators (SERMs): Secondary | ICD-10-CM | POA: Diagnosis not present

## 2020-03-12 DIAGNOSIS — Z8249 Family history of ischemic heart disease and other diseases of the circulatory system: Secondary | ICD-10-CM | POA: Diagnosis not present

## 2020-03-12 DIAGNOSIS — N183 Chronic kidney disease, stage 3 unspecified: Secondary | ICD-10-CM | POA: Diagnosis not present

## 2020-03-12 DIAGNOSIS — G609 Hereditary and idiopathic neuropathy, unspecified: Secondary | ICD-10-CM | POA: Diagnosis not present

## 2020-03-12 DIAGNOSIS — G629 Polyneuropathy, unspecified: Secondary | ICD-10-CM | POA: Diagnosis present

## 2020-03-12 DIAGNOSIS — Z20822 Contact with and (suspected) exposure to covid-19: Secondary | ICD-10-CM | POA: Diagnosis not present

## 2020-03-12 DIAGNOSIS — Z8546 Personal history of malignant neoplasm of prostate: Secondary | ICD-10-CM | POA: Diagnosis not present

## 2020-03-12 DIAGNOSIS — I4510 Unspecified right bundle-branch block: Secondary | ICD-10-CM | POA: Diagnosis not present

## 2020-03-12 DIAGNOSIS — R5381 Other malaise: Secondary | ICD-10-CM | POA: Diagnosis not present

## 2020-03-12 DIAGNOSIS — N179 Acute kidney failure, unspecified: Secondary | ICD-10-CM | POA: Diagnosis not present

## 2020-03-12 DIAGNOSIS — G300 Alzheimer's disease with early onset: Secondary | ICD-10-CM | POA: Diagnosis not present

## 2020-03-12 DIAGNOSIS — I77810 Thoracic aortic ectasia: Secondary | ICD-10-CM | POA: Diagnosis present

## 2020-03-12 DIAGNOSIS — Z83438 Family history of other disorder of lipoprotein metabolism and other lipidemia: Secondary | ICD-10-CM | POA: Diagnosis not present

## 2020-03-12 DIAGNOSIS — K219 Gastro-esophageal reflux disease without esophagitis: Secondary | ICD-10-CM | POA: Diagnosis not present

## 2020-03-12 DIAGNOSIS — N1832 Chronic kidney disease, stage 3b: Secondary | ICD-10-CM | POA: Diagnosis not present

## 2020-03-12 LAB — CBC
HCT: 39.3 % (ref 39.0–52.0)
Hemoglobin: 13 g/dL (ref 13.0–17.0)
MCH: 31.7 pg (ref 26.0–34.0)
MCHC: 33.1 g/dL (ref 30.0–36.0)
MCV: 95.9 fL (ref 80.0–100.0)
Platelets: 152 10*3/uL (ref 150–400)
RBC: 4.1 MIL/uL — ABNORMAL LOW (ref 4.22–5.81)
RDW: 12.6 % (ref 11.5–15.5)
WBC: 8.6 10*3/uL (ref 4.0–10.5)
nRBC: 0 % (ref 0.0–0.2)

## 2020-03-12 LAB — BASIC METABOLIC PANEL
Anion gap: 12 (ref 5–15)
BUN: 22 mg/dL (ref 8–23)
CO2: 24 mmol/L (ref 22–32)
Calcium: 9.6 mg/dL (ref 8.9–10.3)
Chloride: 106 mmol/L (ref 98–111)
Creatinine, Ser: 1.63 mg/dL — ABNORMAL HIGH (ref 0.61–1.24)
GFR calc Af Amer: 43 mL/min — ABNORMAL LOW (ref >60)
GFR calc non Af Amer: 37 mL/min — ABNORMAL LOW (ref >60)
Glucose, Bld: 110 mg/dL — ABNORMAL HIGH (ref 70–99)
Potassium: 4.5 mmol/L (ref 3.5–5.1)
Sodium: 142 mmol/L (ref 135–145)

## 2020-03-12 LAB — TROPONIN I (HIGH SENSITIVITY)
Troponin I (High Sensitivity): 67 ng/L — ABNORMAL HIGH (ref <18)
Troponin I (High Sensitivity): 310 ng/L (ref <18)
Troponin I (High Sensitivity): 178 ng/L (ref <18)
Troponin I (High Sensitivity): 166 ng/L (ref <18)

## 2020-03-12 LAB — HEPARIN LEVEL (UNFRACTIONATED): Heparin Unfractionated: 0.4 IU/mL (ref 0.30–0.70)

## 2020-03-12 LAB — ECHOCARDIOGRAM COMPLETE
Area-P 1/2: 2.69 cm2
Height: 70 in
P 1/2 time: 521 msec
Weight: 2540.8 oz

## 2020-03-12 LAB — RESPIRATORY PANEL BY RT PCR (FLU A&B, COVID)
Influenza A by PCR: NEGATIVE
Influenza B by PCR: NEGATIVE
SARS Coronavirus 2 by RT PCR: NEGATIVE

## 2020-03-12 MED ORDER — SODIUM CHLORIDE 0.9% FLUSH
3.0000 mL | Freq: Two times a day (BID) | INTRAVENOUS | Status: DC
  Administered 2020-03-13 – 2020-03-15 (×4): 3 mL via INTRAVENOUS

## 2020-03-12 MED ORDER — HEPARIN (PORCINE) 25000 UT/250ML-% IV SOLN
900.0000 [IU]/h | INTRAVENOUS | Status: DC
  Administered 2020-03-12 – 2020-03-13 (×2): 900 [IU]/h via INTRAVENOUS
  Filled 2020-03-12 (×2): qty 250

## 2020-03-12 MED ORDER — ASPIRIN EC 81 MG PO TBEC: 81.0000 mg | DELAYED_RELEASE_TABLET | Freq: Every day | ORAL | Status: DC

## 2020-03-12 MED ORDER — SODIUM CHLORIDE 0.9 % WEIGHT BASED INFUSION
1.0000 mL/kg/h | INTRAVENOUS | Status: DC
  Administered 2020-03-12 – 2020-03-13 (×2): 1 mL/kg/h via INTRAVENOUS

## 2020-03-12 MED ORDER — NITROGLYCERIN 0.4 MG SL SUBL: 0.4000 mg | SUBLINGUAL_TABLET | SUBLINGUAL | Status: DC | PRN

## 2020-03-12 MED ORDER — SODIUM CHLORIDE 0.9 % IV SOLN: 250.0000 mL | INTRAVENOUS | Status: DC | PRN

## 2020-03-12 MED ORDER — GABAPENTIN 400 MG PO CAPS
400.0000 mg | ORAL_CAPSULE | Freq: Three times a day (TID) | ORAL | Status: DC
  Administered 2020-03-12 – 2020-03-16 (×11): 400 mg via ORAL
  Filled 2020-03-12 (×11): qty 1

## 2020-03-12 MED ORDER — METOPROLOL TARTRATE 25 MG PO TABS
25.0000 mg | ORAL_TABLET | Freq: Two times a day (BID) | ORAL | Status: DC
  Administered 2020-03-12 – 2020-03-16 (×10): 25 mg via ORAL
  Filled 2020-03-12 (×10): qty 1

## 2020-03-12 MED ORDER — ACETAMINOPHEN 325 MG PO TABS: 650.0000 mg | ORAL_TABLET | ORAL | Status: DC | PRN

## 2020-03-12 MED ORDER — ASPIRIN 81 MG PO CHEW: 81.0000 mg | CHEWABLE_TABLET | Freq: Every day | ORAL | Status: DC

## 2020-03-12 MED ORDER — ATORVASTATIN CALCIUM 80 MG PO TABS
80.0000 mg | ORAL_TABLET | Freq: Every day | ORAL | Status: DC
  Administered 2020-03-12 – 2020-03-16 (×5): 80 mg via ORAL
  Filled 2020-03-12 (×5): qty 1

## 2020-03-12 MED ORDER — PANTOPRAZOLE SODIUM 40 MG PO TBEC
40.0000 mg | DELAYED_RELEASE_TABLET | Freq: Every day | ORAL | Status: DC
  Administered 2020-03-13 – 2020-03-16 (×4): 40 mg via ORAL
  Filled 2020-03-12 (×4): qty 1

## 2020-03-12 MED ORDER — VITAMIN D 25 MCG (1000 UNIT) PO TABS
2000.0000 [IU] | ORAL_TABLET | Freq: Every day | ORAL | Status: DC
  Administered 2020-03-13 – 2020-03-16 (×4): 2000 [IU] via ORAL
  Filled 2020-03-12 (×4): qty 2

## 2020-03-12 MED ORDER — ASPIRIN 325 MG PO TABS
325.0000 mg | ORAL_TABLET | Freq: Once | ORAL | Status: AC
  Administered 2020-03-12: 325 mg via ORAL
  Filled 2020-03-12: qty 1

## 2020-03-12 MED ORDER — HEPARIN BOLUS VIA INFUSION
4000.0000 [IU] | Freq: Once | INTRAVENOUS | Status: AC
  Administered 2020-03-12: 4000 [IU] via INTRAVENOUS
  Filled 2020-03-12: qty 4000

## 2020-03-12 MED ORDER — SERTRALINE HCL 100 MG PO TABS
100.0000 mg | ORAL_TABLET | Freq: Every day | ORAL | Status: DC
  Administered 2020-03-13 – 2020-03-16 (×4): 100 mg via ORAL
  Filled 2020-03-12 (×4): qty 1

## 2020-03-12 MED ORDER — PERFLUTREN LIPID MICROSPHERE
1.0000 mL | INTRAVENOUS | Status: AC | PRN
  Administered 2020-03-12: 2 mL via INTRAVENOUS
  Filled 2020-03-12: qty 10

## 2020-03-12 MED ORDER — SODIUM CHLORIDE 0.9% FLUSH
3.0000 mL | INTRAVENOUS | Status: DC | PRN
  Administered 2020-03-12: 3 mL via INTRAVENOUS

## 2020-03-12 MED ORDER — ASPIRIN 81 MG PO CHEW
81.0000 mg | CHEWABLE_TABLET | ORAL | Status: AC
  Administered 2020-03-13: 81 mg via ORAL
  Filled 2020-03-12 (×2): qty 1

## 2020-03-12 MED ORDER — ONDANSETRON HCL 4 MG/2ML IJ SOLN
4.0000 mg | Freq: Four times a day (QID) | INTRAMUSCULAR | Status: DC | PRN
  Administered 2020-03-13: 4 mg via INTRAVENOUS
  Filled 2020-03-12: qty 2

## 2020-03-12 MED ORDER — ASPIRIN EC 81 MG PO TBEC
81.0000 mg | DELAYED_RELEASE_TABLET | Freq: Every day | ORAL | Status: DC
  Administered 2020-03-14 – 2020-03-16 (×3): 81 mg via ORAL
  Filled 2020-03-12 (×3): qty 1

## 2020-03-12 NOTE — ED Notes (Signed)
Meal tray ordered 

## 2020-03-12 NOTE — ED Provider Notes (Signed)
[facility] EMERGENCY DEPARTMENT Provider Note   CSN: [id number] Arrival date & time: 03/11/20  2203     History Chief Complaint  Patient presents with  . Chest Pain    Ryan Humphrey is a 86 y.o. male.  Patient is a 86 year old male who presents with chest pain.  He has a history of coronary artery disease, prostate cancer, chronic kidney disease, hypertension, hyperlipidemia.  He says that all of his life he has had episodes of chest pain.  Yesterday he had an episode that was more intense.  He describes it as an aching pain.  It was left-sided.  Nonradiating.  No associated shortness of breath.  No nausea or vomiting.  No diaphoresis.  He said it did not last too long.  He could not really elaborate on exactly how long it lasts.  He said he has not had any chest pain through the night and denies any current chest pain.        Past Medical History:  Diagnosis Date  . Anemia   . Arthritis    arthritis,osteopenia,"spinal stenosis"  . Bladder neck obstruction 03/28/2011  . CAD (coronary artery disease)   . Cancer [facility]) 12-06-12   Prostate cancer'98  . CKD (chronic kidney disease) stage 3, GFR 30-59 ml/min 08/17/2017  . Depression   . Essential hypertension, benign 04/10/2009  . GERD (gastroesophageal reflux disease)   . GERD without esophagitis 11/05/2016  . H/O hiatal hernia   . H/O vertigo 12-06-12   none recent  . Hyperlipidemia   . IBS (irritable bowel syndrome)   . Mixed hyperlipidemia 04/10/2009  . Neuropathy   . Osteopenia   . Peripheral neuropathy 12-06-12   peripheral neuropathy  . Rhinitis   . RLS (restless legs syndrome)   . Vascular dementia ([place]) 09/30/2019  . Vitamin B12 deficiency     Patient Active Problem List   Diagnosis Date Noted  . NSTEMI (non-ST elevated myocardial infarction) ([place]) 03/12/2020  . Alzheimer's dementia without behavioral disturbance ([place]) 09/30/2019  . CKD (chronic kidney disease) stage 3, GFR 30-59 ml/min  08/17/2017  . History of TIA (transient ischemic attack) 11/05/2016  . Vitamin B12 deficiency 11/05/2016  . GERD without esophagitis 11/05/2016  . Hereditary and idiopathic peripheral neuropathy 08/20/2015  . Benign essential tremor 08/20/2015  . Vitamin D deficiency 01/06/2015  . Medication management 01/06/2015  . Primary osteoarthritis of right knee 11/25/2014  . Malignant neoplasm of prostate ([place]) 03/28/2011  . INTERMITTENT VERTIGO 10/12/2009  . Mixed hyperlipidemia 04/10/2009  . Essential hypertension, benign 04/10/2009  . CORONARY ATHEROSCLEROSIS NATIVE CORONARY ARTERY 04/10/2009    Past Surgical History:  Procedure Laterality Date  . APPENDECTOMY    . CARPAL TUNNEL RELEASE     both hands  . cataract surgery Left 12-06-12   recent surgery  . CHOLECYSTECTOMY    . COLONOSCOPY WITH PROPOFOL N/A 12/25/2012   Procedure: COLONOSCOPY WITH PROPOFOL;  Surgeon: Garlan Fair, MD;  Location: [facility] ENDOSCOPY;  Service: Endoscopy;  Laterality: N/A;  . ESOPHAGOGASTRODUODENOSCOPY (EGD) WITH PROPOFOL N/A 12/25/2012   Procedure: ESOPHAGOGASTRODUODENOSCOPY (EGD) WITH PROPOFOL;  Surgeon: Garlan Fair, MD;  Location: [facility] ENDOSCOPY;  Service: Endoscopy;  Laterality: N/A;  . PROSTATE SURGERY  12-06-12  . TONSILLECTOMY    . TOTAL KNEE ARTHROPLASTY Right 11/25/2014   Procedure: RIGHT TOTAL KNEE ARTHROPLASTY;  Surgeon: Melrose Nakayama, MD;  Location: [place];  Service: Orthopedics;  Laterality: Right;       Family History  Problem Relation Age  of Onset  . CAD Father        Deceased, 30  . Dementia Mother        Deceased, 2  . Diabetes Brother   . Hyperlipidemia Brother   . Hypertension Brother   . Dementia Brother   . Colon cancer Neg Hx   . Colon polyps Neg Hx   . Kidney disease Neg Hx   . Esophageal cancer Neg Hx   . Gallbladder disease Neg Hx     Social History   Tobacco Use  . Smoking status: Former Smoker    Packs/day: 0.50    Years: 30.00    Pack years: 15.00    Quit date:  06/14/1963    Years since quitting: 56.7  . Smokeless tobacco: Never Used  Vaping Use  . Vaping Use: Never used  Substance Use Topics  . Alcohol use: Yes    Alcohol/week: 0.0 standard drinks    Comment: Socially  . Drug use: No    Home Medications Prior to Admission medications   Medication Sig Start Date End Date Taking? Authorizing Provider  aspirin 325 MG EC tablet Take 325 mg by mouth as needed for pain (headache).   Yes [provider]  Cholecalciferol (VITAMIN D) 50 MCG (2000 UT) tablet Take 1 pill daily. Patient taking differently: Take 2,000 Units by mouth daily.  01/10/19  Yes Unk Pinto, MD  gabapentin (NEURONTIN) 400 MG capsule Take 1 capsule 3 times a day for nerve pain. Patient taking differently: Take 400 mg by mouth 3 (three) times daily.  09/10/19  Yes Unk Pinto, MD  omeprazole (PRILOSEC) 40 MG capsule Takes 1 capsule Daily for  Indigestion & Heartburn Patient taking differently: Take 40 mg by mouth daily.  09/10/19  Yes Unk Pinto, MD  propranolol (INDERAL) 20 MG tablet Take 1 tablet   2 x /day for BP Patient taking differently: Take 20 mg by mouth 2 (two) times daily.  09/10/19  Yes Unk Pinto, MD  sertraline (ZOLOFT) 100 MG tablet 1 tablet Daily for Mood Patient taking differently: Take 100 mg by mouth daily.  09/10/19 03/30/30 Yes Unk Pinto, MD  simvastatin (ZOCOR) 20 MG tablet Take 1 tablet Daily for Cholesterol. Patient taking differently: Take 20 mg by mouth daily at 6 PM.  09/10/19  Yes Unk Pinto, MD    Allergies    Niacin, Penicillins, and Tramadol  Review of Systems   Review of Systems  Constitutional: Negative for chills, diaphoresis, fatigue and fever.  HENT: Negative for congestion, rhinorrhea and sneezing.   Eyes: Negative.   Respiratory: Negative for cough, chest tightness and shortness of breath.   Cardiovascular: Positive for chest pain. Negative for leg swelling.  Gastrointestinal: Negative for abdominal  pain, blood in stool, diarrhea, nausea and vomiting.  Genitourinary: Negative for difficulty urinating, flank pain, frequency and hematuria.  Musculoskeletal: Negative for arthralgias and back pain.  Skin: Negative for rash.  Neurological: Negative for dizziness, speech difficulty, weakness, numbness and headaches.    Physical Exam Updated Vital Signs BP (!) 154/68   Pulse 63   Temp 98.6 F (37 C) (Oral)   Resp 13   Ht 5\' 10"  (1.778 m)   Wt 72 kg   SpO2 100%   BMI 22.79 kg/m   Physical Exam Constitutional:      Appearance: He is well-developed.  HENT:     Head: Normocephalic and atraumatic.  Eyes:     Pupils: Pupils are equal, round, and reactive to light.  Cardiovascular:     Rate and Rhythm: Normal rate and regular rhythm.     Heart sounds: Normal heart sounds.  Pulmonary:     Effort: Pulmonary effort is normal. No respiratory distress.     Breath sounds: Normal breath sounds. No wheezing or rales.  Chest:     Chest wall: No tenderness.  Abdominal:     General: Bowel sounds are normal.     Palpations: Abdomen is soft.     Tenderness: There is no abdominal tenderness. There is no guarding or rebound.  Musculoskeletal:        General: Normal range of motion.     Cervical back: Normal range of motion and neck supple.  Lymphadenopathy:     Cervical: No cervical adenopathy.  Skin:    General: Skin is warm and dry.     Findings: No rash.  Neurological:     Mental Status: He is alert and oriented to person, place, and time.     ED Results / Procedures / Treatments   Labs (all labs ordered are listed, but only abnormal results are displayed) Labs Reviewed  BASIC METABOLIC PANEL - Abnormal; Notable for the following components:      Result Value   Glucose, Bld 110 (*)    Creatinine, Ser 1.63 (*)    GFR calc non Af Amer 37 (*)    GFR calc Af Amer 43 (*)    All other components within normal limits  CBC - Abnormal; Notable for the following components:   RBC  4.10 (*)    All other components within normal limits  TROPONIN I (HIGH SENSITIVITY) - Abnormal; Notable for the following components:   Troponin I (High Sensitivity) 67 (*)    All other components within normal limits  TROPONIN I (HIGH SENSITIVITY) - Abnormal; Notable for the following components:   Troponin I (High Sensitivity) 310 (*)    All other components within normal limits  RESPIRATORY PANEL BY RT PCR (FLU A&B, COVID)  HEPARIN LEVEL (UNFRACTIONATED)  TROPONIN I (HIGH SENSITIVITY)    EKG EKG Interpretation  Date/Time:  Thursday March 12 2020 09:43:16 EDT Ventricular Rate:  61 PR Interval:  176 QRS Duration: 144 QT Interval:  445 QTC Calculation: 449 R Axis:   10 Text Interpretation: Sinus rhythm Atrial premature complex Right bundle branch block Confirmed by Malvin Johns [phone]) on 03/12/2020 11:49:50 AM   Radiology DG Chest 2 View  Result Date: 03/11/2020 CLINICAL DATA:  Chest pain EXAM: CHEST - 2 VIEW COMPARISON:  09/30/2019 FINDINGS: The heart size and mediastinal contours are within normal limits. Both lungs are clear. The visualized skeletal structures are unremarkable. Aortic atherosclerosis. Degenerative changes of the spine. IMPRESSION: No active cardiopulmonary disease. Electronically Signed   By: Donavan Foil M.D.   On: 03/11/2020 22:54    Procedures Procedures (including critical care time)  Medications Ordered in ED Medications  metoprolol tartrate (LOPRESSOR) tablet 25 mg (25 mg Oral Given 03/12/20 1322)  atorvastatin (LIPITOR) tablet 80 mg (80 mg Oral Given 03/12/20 1321)  heparin ADULT infusion 100 units/mL (25000 units/241mL sodium chloride 0.45%) (900 Units/hr Intravenous New Bag/Given 03/12/20 1330)  perflutren lipid microspheres (DEFINITY) IV suspension (2 mLs Intravenous Given 03/12/20 1400)  aspirin tablet 325 mg (325 mg Oral Given 03/12/20 1321)  heparin bolus via infusion 4,000 Units (4,000 Units Intravenous Bolus from Bag 03/12/20 1330)     ED Course  I have reviewed the triage vital signs and the nursing notes.  Pertinent labs &  imaging results that were available during my care of the patient were reviewed by me and considered in my medical decision making (see chart for details).    MDM Rules/Calculators/A&P                          Patient is a 86 year old male who presents with chest pain.  He has no ischemic changes on EKG.  His troponins however are trending upward.  He does have some baseline kidney dysfunction with an elevated creatinine.  His chest x-ray is clear without evidence of pneumonia or pulmonary edema.  I spoke with cardiology who will admit the patient and plan on a cardiac catheterization tomorrow.  They have started the patient on aspirin and heparin.  CRITICAL CARE Performed by: Malvin Johns Total critical care time: 60 minutes Critical care time was exclusive of separately billable procedures and treating other patients. Critical care was necessary to treat or prevent imminent or life-threatening deterioration. Critical care was time spent personally by me on the following activities: development of treatment plan with patient and/or surrogate as well as nursing, discussions with consultants, evaluation of patient's response to treatment, examination of patient, obtaining history from patient or surrogate, ordering and performing treatments and interventions, ordering and review of laboratory studies, ordering and review of radiographic studies, pulse oximetry and re-evaluation of patient's condition.  Final Clinical Impression(s) / ED Diagnoses Final diagnoses:  NSTEMI (non-ST elevated myocardial infarction) [facility])    Rx / [place] Orders ED Discharge Orders    None       Malvin Johns, MD 03/12/20 1441

## 2020-03-12 NOTE — Progress Notes (Signed)
[place] for heparin Indication: chest pain/ACS  Allergies  Allergen Reactions  . Niacin Itching  . Penicillins Other (See Comments)    "rash"   . Tramadol Itching    Patient Measurements: Height: 5\' 10"  (177.8 cm) Weight: 72 kg (158 lb 12.8 oz) IBW/kg (Calculated) : 73 Heparin Dosing Weight: 72kg  Vital Signs: Temp: 98.1 F (36.7 C) (09/30 2145) Temp Source: Oral (09/30 2145) BP: 113/53 (09/30 2145) Pulse Rate: 72 (09/30 2145)  Labs: Recent Labs    03/11/20 2307 03/11/20 2307 03/12/20 0436 03/12/20 1520 03/12/20 1730 03/12/20 2154  HGB 13.0  --   --   --   --   --   HCT 39.3  --   --   --   --   --   PLT 152  --   --   --   --   --   HEPARINUNFRC  --   --   --   --   --  0.40  CREATININE 1.63*  --   --   --   --   --   TROPONINIHS 67*   < > 310* 178* 166*  --    < > = values in this interval not displayed.    Estimated Creatinine Clearance: 31.9 mL/min (A) (by C-G formula based on SCr of 1.63 mg/dL (H)).   Medical History: Past Medical History:  Diagnosis Date  . Anemia   . Arthritis    arthritis,osteopenia,"spinal stenosis"  . Bladder neck obstruction 03/28/2011  . CAD (coronary artery disease)   . Cancer [facility]) 12-06-12   Prostate cancer'98  . CKD (chronic kidney disease) stage 3, GFR 30-59 ml/min 08/17/2017  . Depression   . Essential hypertension, benign 04/10/2009  . GERD (gastroesophageal reflux disease)   . GERD without esophagitis 11/05/2016  . H/O hiatal hernia   . H/O vertigo 12-06-12   none recent  . Hyperlipidemia   . IBS (irritable bowel syndrome)   . Mixed hyperlipidemia 04/10/2009  . Neuropathy   . Osteopenia   . Peripheral neuropathy 12-06-12   peripheral neuropathy  . Rhinitis   . RLS (restless legs syndrome)   . Vascular dementia ([place]) 09/30/2019  . Vitamin B12 deficiency     Medications:  Infusions:  . heparin 900 Units/hr (03/12/20 1330)    Assessment: 53 yom presented to the ED  with CP. Troponin is increasing and now starting IV heparin. Baseline CBC is WNL. He is not on anticoagulation PTA. Planning cardiac cath tomorrow.   Initial heparin level therapeutic at 0.40, will confirm with am labs.  Goal of Therapy:  Heparin level 0.3-0.7 units/ml Monitor platelets by anticoagulation protocol: Yes   Plan:  Continue heparin 900 units/h Daily heparin level and CBC   Arrie Senate, PharmD, BCPS Clinical Pharmacist [phone] Please check AMION for all [facility] Pharm[facility] numbers 03/12/2020

## 2020-03-12 NOTE — Progress Notes (Signed)
ANTICOAGULATION CONSULT NOTE - Initial Consult  Pharmacy Consult for heparin Indication: chest pain/ACS  Allergies  Allergen Reactions   Niacin Itching   Penicillins Other (See Comments)    "rash"    Tramadol Itching    Patient Measurements: Height: 5\' 10"  (177.8 cm) Weight: 72 kg (158 lb 12.8 oz) IBW/kg (Calculated) : 73 Heparin Dosing Weight: 72kg  Vital Signs: Temp: 98.6 F (37 C) (09/30 0842) Temp Source: Oral (09/30 0842) BP: 140/64 (09/30 1045) Pulse Rate: 66 (09/30 1045)  Labs: Recent Labs    03/11/20 2307 03/12/20 0436  HGB 13.0  --   HCT 39.3  --   PLT 152  --   CREATININE 1.63*  --   TROPONINIHS 67* 310*    Estimated Creatinine Clearance: 31.9 mL/min (A) (by C-G formula based on SCr of 1.63 mg/dL (H)).   Medical History: Past Medical History:  Diagnosis Date   Anemia    Arthritis    arthritis,osteopenia,"spinal stenosis"   Bladder neck obstruction 03/28/2011   CAD (coronary artery disease)    Cancer ([facility]) 12-06-12   Prostate cancer'98   CKD (chronic kidney disease) stage 3, GFR 30-59 ml/min 08/17/2017   Depression    Essential hypertension, benign 04/10/2009   GERD (gastroesophageal reflux disease)    GERD without esophagitis 11/05/2016   H/O hiatal hernia    H/O vertigo 12-06-12   none recent   Hyperlipidemia    IBS (irritable bowel syndrome)    Mixed hyperlipidemia 04/10/2009   Neuropathy    Osteopenia    Peripheral neuropathy 12-06-12   peripheral neuropathy   Rhinitis    RLS (restless legs syndrome)    Vascular dementia ([place]) 09/30/2019   Vitamin B12 deficiency     Medications:  Infusions:   heparin      Assessment: 81 yom presented to the ED with CP. Troponin is increasing and now starting IV heparin. Baseline CBC is WNL. He is not on anticoagulation PTA. Planning cardiac cath tomorrow.   Goal of Therapy:  Heparin level 0.3-0.7 units/ml Monitor platelets by anticoagulation protocol: Yes   Plan:   Heparin bolus 4000 units IV x 1 Heparin gtt 900 units/hr Check an 8 hr heparin level Daily heparin level and CBC  Lezley Bedgood, Rande Lawman 03/12/2020,12:41 PM

## 2020-03-12 NOTE — ED Notes (Signed)
Dinner tray ordered.

## 2020-03-12 NOTE — ED Notes (Signed)
ECHO at bedside.

## 2020-03-12 NOTE — ED Notes (Signed)
Ryan Humphrey (daughter) would like call when time of cardiac cath is known

## 2020-03-12 NOTE — Progress Notes (Signed)
  Echocardiogram 2D Echocardiogram with definity has been performed.  Darlina Sicilian M 03/12/2020, 2:27 PM

## 2020-03-12 NOTE — H&P (Addendum)
Cardiology Admission History and Physical:   Patient ID: Ryan Humphrey MRN: [id number]; DOB: December 13, 1928   Admission date: 03/11/2020  Primary Care Provider: Unk Pinto, MD [facility] Cardiologist: Elouise Munroe, MD   Chief Complaint:  Chest pain   Patient Profile:   Ryan Humphrey is a 86 y.o. male with hx of non obstructive CAD, HLD, hypertension, dementia, CKD III and prostate cancer presented for chest pain evaluation.   Cath in 2010 showed 40% proximal LAD disease and 50% ostial diagonal disease. No significant disease in the RCA or Circumflex.  Seen by Dr. Harrington Challenger in 2016 for chest pain. Follow up stress test negative for inducible ischemia. EF was normal.  Seen by Dr. Margaretann Loveless 03/2018 for chest pain. Order coronary CTA but patient never scheduled it or showed up for follow up.   Echo 09/2019 showed LVEF of 60-65%, grade I DD. 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 There is mild dilatation of the ascending aorta measuring 40 mm.   History of Present Illness:   Ryan Humphrey lives at [facility] living facility.  Yesterday he had sudden onset substernal chest pressure radiating across his chest at 9 PM while laying.  Associated with shortness of breath.  No radiation to his jaw or arm.  Denies diaphoresis nausea or vomiting.  Symptoms lasted greater than 30 minutes and eventually able to stood up and got help from nearby resident.  EMS was called.  Per EMS run sheet, patient with dementia and intermittent complaint of chest pain and leg pain.  History is unreliable.  Did not cause any medication since arrival.  Chest pain-free.  No family at bedside.  Respiratory panel negative for COVID and influenza.  Hs-Troponin 67>>310 CXR without acute abnormality.  Recent LDL 104 Scr 1.63 Hgb 13.0  Past Medical History:  Diagnosis Date  . Anemia   . Arthritis    arthritis,osteopenia,"spinal stenosis"  . Bladder neck obstruction 03/28/2011    . CAD (coronary artery disease)   . Cancer [facility]) 12-06-12   Prostate cancer'98  . CKD (chronic kidney disease) stage 3, GFR 30-59 ml/min 08/17/2017  . Depression   . Essential hypertension, benign 04/10/2009  . GERD (gastroesophageal reflux disease)   . GERD without esophagitis 11/05/2016  . H/O hiatal hernia   . H/O vertigo 12-06-12   none recent  . Hyperlipidemia   . IBS (irritable bowel syndrome)   . Mixed hyperlipidemia 04/10/2009  . Neuropathy   . Osteopenia   . Peripheral neuropathy 12-06-12   peripheral neuropathy  . Rhinitis   . RLS (restless legs syndrome)   . Vascular dementia ([place]) 09/30/2019  . Vitamin B12 deficiency     Past Surgical History:  Procedure Laterality Date  . APPENDECTOMY    . CARPAL TUNNEL RELEASE     both hands  . cataract surgery Left 12-06-12   recent surgery  . CHOLECYSTECTOMY    . COLONOSCOPY WITH PROPOFOL N/A 12/25/2012   Procedure: COLONOSCOPY WITH PROPOFOL;  Surgeon: Garlan Fair, MD;  Location: [facility];  Service: Endoscopy;  Laterality: N/A;  . ESOPHAGOGASTRODUODENOSCOPY (EGD) WITH PROPOFOL N/A 12/25/2012   Procedure: ESOPHAGOGASTRODUODENOSCOPY (EGD) WITH PROPOFOL;  Surgeon: Garlan Fair, MD;  Location: [facility];  Service: Endoscopy;  Laterality: N/A;  . PROSTATE SURGERY  12-06-12  . TONSILLECTOMY    . TOTAL KNEE ARTHROPLASTY Right 11/25/2014   Procedure: RIGHT TOTAL KNEE ARTHROPLASTY;  Surgeon: Melrose Nakayama, MD;  Location: [place];  Service: Orthopedics;  Laterality: Right;     Medications Prior to Admission: Prior to Admission medications   Medication Sig Start Date End Date Taking? Authorizing Provider  aspirin 325 MG EC tablet Take 325 mg by mouth as needed for pain (headache).   Yes [provider]  Cholecalciferol (VITAMIN D) 50 MCG (2000 UT) tablet Take 1 pill daily. Patient taking differently: Take 2,000 Units by mouth daily.  01/10/19  Yes Unk Pinto, MD  gabapentin (NEURONTIN) 400 MG capsule Take 1  capsule 3 times a day for nerve pain. Patient taking differently: Take 400 mg by mouth 3 (three) times daily.  09/10/19  Yes Unk Pinto, MD  omeprazole (PRILOSEC) 40 MG capsule Takes 1 capsule Daily for  Indigestion & Heartburn Patient taking differently: Take 40 mg by mouth daily.  09/10/19  Yes Unk Pinto, MD  propranolol (INDERAL) 20 MG tablet Take 1 tablet   2 x /day for BP Patient taking differently: Take 20 mg by mouth 2 (two) times daily.  09/10/19  Yes Unk Pinto, MD  sertraline (ZOLOFT) 100 MG tablet 1 tablet Daily for Mood Patient taking differently: Take 100 mg by mouth daily.  09/10/19 03/30/30 Yes Unk Pinto, MD  simvastatin (ZOCOR) 20 MG tablet Take 1 tablet Daily for Cholesterol. Patient taking differently: Take 20 mg by mouth daily at 6 PM.  09/10/19  Yes Unk Pinto, MD     Allergies:    Allergies  Allergen Reactions  . Niacin Itching  . Penicillins Other (See Comments)    "rash"   . Tramadol Itching    Social History:   Social History   Socioeconomic History  . Marital status: Widowed    Spouse name: Not on file  . Number of children: 2  . Years of education: College  . Highest education level: Not on file  Occupational History  . Occupation: Retired  Tobacco Use  . Smoking status: Former Smoker    Packs/day: 0.50    Years: 30.00    Pack years: 15.00    Quit date: 06/14/1963    Years since quitting: 56.7  . Smokeless tobacco: Never Used  Vaping Use  . Vaping Use: Never used  Substance and Sexual Activity  . Alcohol use: Yes    Alcohol/week: 0.0 standard drinks    Comment: Socially  . Drug use: No  . Sexual activity: Not Currently  Other Topics Concern  . Not on file  Social History Narrative   Pt lives at home alone, widowed in 2011, married for 55 years. They have two grown daugthers.   Plays the piano.   Caffeine Use: Rarely   Social Determinants of Health   Financial Resource Strain:   . Difficulty of Paying Living  Expenses: Not on file  Food Insecurity:   . Worried About Charity fundraiser in the Last Year: Not on file  . Ran Out of Food in the Last Year: Not on file  Transportation Needs:   . Lack of Transportation (Medical): Not on file  . Lack of Transportation (Non-Medical): Not on file  Physical Activity:   . Days of Exercise per Week: Not on file  . Minutes of Exercise per Session: Not on file  Stress:   . Feeling of Stress : Not on file  Social Connections:   . Frequency of Communication with Friends and Family: Not on file  . Frequency of Social Gatherings with Friends and Family: Not on file  . Attends Religious Services: Not on file  .  Active Member of Clubs or Organizations: Not on file  . Attends Archivist Meetings: Not on file  . Marital Status: Not on file  Intimate Partner Violence:   . Fear of Current or Ex-Partner: Not on file  . Emotionally Abused: Not on file  . Physically Abused: Not on file  . Sexually Abused: Not on file    Family History:   The patient's family history includes CAD in his father; Dementia in his brother and mother; Diabetes in his brother; Hyperlipidemia in his brother; Hypertension in his brother. There is no history of Colon cancer, Colon polyps, Kidney disease, Esophageal cancer, or Gallbladder disease.    ROS:  Please see the history of present illness.  All other ROS reviewed and negative.     Physical Exam/Data:   Vitals:   03/12/20 1000 03/12/20 1015 03/12/20 1030 03/12/20 1045  BP: 133/61 138/60 137/68 140/64  Pulse: 64 63 63 66  Resp: 11 12 12 15   Temp:      TempSrc:      SpO2: 99% 100% 100% 100%   No intake or output data in the 24 hours ending 03/12/20 1219 Last 3 Weights 02/26/2020 11/12/2019 10/10/2019  Weight (lbs) 158 lb 12.8 oz 166 lb 163 lb 3.2 oz  Weight (kg) 72.031 kg 75.297 kg 74.027 kg     There is no height or weight on file to calculate BMI.  General:  Well nourished, well developed, in no acute  distress HEENT: normal Lymph: no adenopathy Neck: no JVD Endocrine:  No thryomegaly Vascular: No carotid bruits; FA pulses 2+ bilaterally without bruits  Cardiac:  normal S1, S2; RRR; no murmur  Lungs:  clear to auscultation bilaterally, no wheezing, rhonchi or rales  Abd: soft, nontender, no hepatomegaly  Ext: no edema Musculoskeletal:  No deformities, BUE and BLE strength normal and equal Skin: warm and dry  Neuro:  CNs 2-12 intact, no focal abnormalities noted Psych:  Normal affect    EKG:  The ECG that was done today was personally reviewed and demonstrates NSR, chronic RBBB  Relevant CV Studies:  Echo 09/30/19 1. Left ventricular ejection fraction, by estimation, is 60 to 65%. The  left ventricle has normal function. The left ventricle has no regional  wall motion abnormalities. There is mild left ventricular hypertrophy of  the basal-septal segment. Left  ventricular diastolic parameters are consistent with Grade I diastolic  dysfunction (impaired relaxation).  2. Right ventricular systolic function is normal. The right ventricular  size is normal.  3. The mitral valve is normal in structure. No evidence of mitral valve  regurgitation. No evidence of mitral stenosis.  4. The aortic valve is normal in structure. Aortic valve regurgitation is  not visualized. No aortic stenosis is present.  5. 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 There is  mild dilatation of the ascending aorta measuring 40 mm.  6. The inferior vena cava is normal in size with greater than 50%  respiratory variability, suggesting right atrial pressure of 3 mmHg.   Laboratory Data:  High Sensitivity Troponin:   Recent Labs  Lab 03/11/20 2307 03/12/20 0436  TROPONINIHS 67* 310*      Chemistry Recent Labs  Lab 03/11/20 2307  NA 142  K 4.5  CL 106  CO2 24  GLUCOSE 110*  BUN 22  CREATININE 1.63*  CALCIUM 9.6  GFRNONAA 37*  GFRAA 43*  ANIONGAP 12     Hematology Recent Labs  Lab 03/11/20 2307  WBC 8.6  RBC 4.10*  HGB 13.0  HCT 39.3  MCV 95.9  MCH 31.7  MCHC 33.1  RDW 12.6  PLT 152   Radiology/Studies:  DG Chest 2 View  Result Date: 03/11/2020 CLINICAL DATA:  Chest pain EXAM: CHEST - 2 VIEW COMPARISON:  09/30/2019 FINDINGS: The heart size and mediastinal contours are within normal limits. Both lungs are clear. The visualized skeletal structures are unremarkable. Aortic atherosclerosis. Degenerative changes of the spine. IMPRESSION: No active cardiopulmonary disease. Electronically Signed   By: Donavan Foil M.D.   On: 03/11/2020 22:54  [id number]} TIMI Risk Score for Unstable Angina or Non-ST Elevation MI:   The patient's TIMI risk score is 4, which indicates a 20% risk of all cause mortality, new or recurrent myocardial infarction or need for urgent revascularization in the next 14 days.   Assessment and Plan:   1. NSTEMI - Hx is unreliable given age and mild dementia. However, his symptoms concerning for [place]. He is pain free since here. Hs-Troponin 67>>310. EKG with chronic RBBB. - Start ASA 81mg   - Cycle troponin  - Change Zocor to Lipitor  - Get echo - Start heparin - Cath tomorrow  2. CAD - Mild non obstructive CAD by cath in 2010. Negative stress test in 2016. Never completed Coronary CTA ordered in 2019 for CP.  - On statin as above  3. CKD III - Scr stable at 1.5-1.6 - Follow closely - Hydrate overnight   4. HLD - 09/30/2019: VLDL 10 02/26/2020: Cholesterol 171; HDL 41; LDL Cholesterol (Calc) 104; Triglycerides 151  - Continue statin as above  5. HTN - BP stable  - Change propranolol 20mg  BID to metoprolol 25mg  BID  Severity of Illness: The appropriate patient status for this patient is INPATIENT. Inpatient status is judged to be reasonable and necessary in order to provide the required intensity of service to ensure the patient's safety. The patient's presenting symptoms, physical exam findings, and initial  radiographic and laboratory data in the context of their chronic comorbidities is felt to place them at high risk for further clinical deterioration. Furthermore, it is not anticipated that the patient will be medically stable for discharge from the hospital within 2 midnights of admission. The following factors support the patient status of inpatient.   " The patient's presenting symptoms include  Chest tightness. " The worrisome physical exam findings include None " The initial radiographic and laboratory data are worrisome because of elevated heart enzyme " The chronic co-morbidities include dementia, HLD  * I certify that at the point of admission it is my clinical judgment that the patient will require inpatient hospital care spanning beyond 2 midnights from the point of admission due to high intensity of service, high risk for further deterioration and high frequency of surveillance required.    For questions or updates, please contact [place] Please consult [email or web address] for contact info under     Jarrett Soho, PA  03/12/2020 12:19 PM

## 2020-03-12 NOTE — ED Notes (Signed)
Cardiology at the bedside.

## 2020-03-12 NOTE — Progress Notes (Signed)
Discussed with daughter this afternoon this patient regarding cardiac cath.  They decided to proceed with cardiac cath.  We will plan for hydration overnight.  He will be n.p.o. at midnight for left heart cath.  I did explain to them the risks of cardiac cath including bleeding, infection, damage to internal structures, myocardial infarction, stroke or death.  They do accept the risk.  They also accept the risk to his kidneys.  He has CKD stage IIIb.  However they are willing to proceed with cardiac cath.  [place] T. Audie Box, [place]  [address] [place], [place] [address] [phone]  5:30 PM

## 2020-03-13 ENCOUNTER — Encounter (HOSPITAL_COMMUNITY)
Admission: EM | Disposition: A | Discharge status: Nursing Home (Includes ICF) | Payer: Self-pay | Source: Skilled Nursing Facility | Attending: Cardiovascular Disease

## 2020-03-13 DIAGNOSIS — N1832 Chronic kidney disease, stage 3b: Secondary | ICD-10-CM

## 2020-03-13 HISTORY — PX: LEFT HEART CATH AND CORONARY ANGIOGRAPHY: CATH118249

## 2020-03-13 HISTORY — PX: INTRAVASCULAR ULTRASOUND/IVUS: CATH118244

## 2020-03-13 HISTORY — PX: CORONARY STENT INTERVENTION: CATH118234

## 2020-03-13 LAB — TROPONIN I (HIGH SENSITIVITY)
Troponin I (High Sensitivity): 130 ng/L (ref <18)
Troponin I (High Sensitivity): 136 ng/L (ref <18)

## 2020-03-13 LAB — CBC
HCT: 33.7 % — ABNORMAL LOW (ref 39.0–52.0)
Hemoglobin: 11.5 g/dL — ABNORMAL LOW (ref 13.0–17.0)
MCH: 33.2 pg (ref 26.0–34.0)
MCHC: 34.1 g/dL (ref 30.0–36.0)
MCV: 97.4 fL (ref 80.0–100.0)
Platelets: 112 10*3/uL — ABNORMAL LOW (ref 150–400)
RBC: 3.46 MIL/uL — ABNORMAL LOW (ref 4.22–5.81)
RDW: 12.7 % (ref 11.5–15.5)
WBC: 6.5 10*3/uL (ref 4.0–10.5)
nRBC: 0 % (ref 0.0–0.2)

## 2020-03-13 LAB — BASIC METABOLIC PANEL
Anion gap: 14 (ref 5–15)
Anion gap: 7 (ref 5–15)
BUN: 72 mg/dL — ABNORMAL HIGH (ref 8–23)
BUN: 23 mg/dL (ref 8–23)
CO2: 24 mmol/L (ref 22–32)
CO2: 25 mmol/L (ref 22–32)
Calcium: 8.7 mg/dL — ABNORMAL LOW (ref 8.9–10.3)
Calcium: 8.6 mg/dL — ABNORMAL LOW (ref 8.9–10.3)
Chloride: 112 mmol/L — ABNORMAL HIGH (ref 98–111)
Chloride: 106 mmol/L (ref 98–111)
Creatinine, Ser: 2.43 mg/dL — ABNORMAL HIGH (ref 0.61–1.24)
Creatinine, Ser: 1.61 mg/dL — ABNORMAL HIGH (ref 0.61–1.24)
GFR calc Af Amer: 27 mL/min — ABNORMAL LOW (ref >60)
GFR calc Af Amer: 44 mL/min — ABNORMAL LOW (ref >60)
GFR calc non Af Amer: 23 mL/min — ABNORMAL LOW (ref >60)
GFR calc non Af Amer: 38 mL/min — ABNORMAL LOW (ref >60)
Glucose, Bld: 160 mg/dL — ABNORMAL HIGH (ref 70–99)
Glucose, Bld: 102 mg/dL — ABNORMAL HIGH (ref 70–99)
Potassium: 3.4 mmol/L — ABNORMAL LOW (ref 3.5–5.1)
Potassium: 3.9 mmol/L (ref 3.5–5.1)
Sodium: 150 mmol/L — ABNORMAL HIGH (ref 135–145)
Sodium: 138 mmol/L (ref 135–145)

## 2020-03-13 LAB — POCT ACTIVATED CLOTTING TIME
Activated Clotting Time: 252 seconds
Activated Clotting Time: 268 seconds
Activated Clotting Time: 263 seconds

## 2020-03-13 LAB — HEPARIN LEVEL (UNFRACTIONATED): Heparin Unfractionated: >2.2 IU/mL — ABNORMAL HIGH (ref 0.30–0.70)

## 2020-03-13 LAB — TSH: TSH: 3.715 u[IU]/mL (ref 0.350–4.500)

## 2020-03-13 SURGERY — LEFT HEART CATH AND CORONARY ANGIOGRAPHY
Anesthesia: LOCAL

## 2020-03-13 MED ORDER — SODIUM CHLORIDE 0.9 % IV SOLN: INTRAVENOUS | Status: AC

## 2020-03-13 MED ORDER — LIDOCAINE HCL (PF) 1 % IJ SOLN
INTRAMUSCULAR | Status: AC
  Filled 2020-03-13: qty 30

## 2020-03-13 MED ORDER — PROMETHAZINE HCL 25 MG/ML IJ SOLN: 12.5000 mg | Freq: Four times a day (QID) | INTRAMUSCULAR | Status: DC | PRN

## 2020-03-13 MED ORDER — HEPARIN SODIUM (PORCINE) 1000 UNIT/ML IJ SOLN
INTRAMUSCULAR | Status: AC
  Filled 2020-03-13: qty 1

## 2020-03-13 MED ORDER — CLOPIDOGREL BISULFATE 300 MG PO TABS
ORAL_TABLET | ORAL | Status: DC | PRN
  Administered 2020-03-13: 600 mg via ORAL

## 2020-03-13 MED ORDER — SODIUM CHLORIDE 0.9% FLUSH
3.0000 mL | Freq: Two times a day (BID) | INTRAVENOUS | Status: DC
  Administered 2020-03-14 – 2020-03-15 (×3): 3 mL via INTRAVENOUS

## 2020-03-13 MED ORDER — SODIUM CHLORIDE 0.9 % IV SOLN: 250.0000 mL | INTRAVENOUS | Status: DC | PRN

## 2020-03-13 MED ORDER — HYDRALAZINE HCL 20 MG/ML IJ SOLN: 10.0000 mg | INTRAMUSCULAR | Status: AC | PRN

## 2020-03-13 MED ORDER — LABETALOL HCL 5 MG/ML IV SOLN: 10.0000 mg | INTRAVENOUS | Status: AC | PRN

## 2020-03-13 MED ORDER — VERAPAMIL HCL 2.5 MG/ML IV SOLN
INTRAVENOUS | Status: DC | PRN
  Administered 2020-03-13: 10 mL via INTRA_ARTERIAL

## 2020-03-13 MED ORDER — HEPARIN SODIUM (PORCINE) 1000 UNIT/ML IJ SOLN
INTRAMUSCULAR | Status: DC | PRN
  Administered 2020-03-13 (×2): 3500 [IU] via INTRAVENOUS
  Administered 2020-03-13 (×2): 2000 [IU] via INTRAVENOUS

## 2020-03-13 MED ORDER — NITROGLYCERIN 1 MG/10 ML FOR IR/CATH LAB
INTRA_ARTERIAL | Status: AC
  Filled 2020-03-13: qty 10

## 2020-03-13 MED ORDER — LIDOCAINE HCL (PF) 1 % IJ SOLN
INTRAMUSCULAR | Status: DC | PRN
  Administered 2020-03-13: 2 mL

## 2020-03-13 MED ORDER — IOHEXOL 350 MG/ML SOLN
INTRAVENOUS | Status: DC | PRN
  Administered 2020-03-13: 95 mL

## 2020-03-13 MED ORDER — SODIUM CHLORIDE 0.9% FLUSH: 3.0000 mL | INTRAVENOUS | Status: DC | PRN

## 2020-03-13 MED ORDER — NITROGLYCERIN 1 MG/10 ML FOR IR/CATH LAB
INTRA_ARTERIAL | Status: DC | PRN
  Administered 2020-03-13 (×2): 200 ug via INTRACORONARY

## 2020-03-13 MED ORDER — VERAPAMIL HCL 2.5 MG/ML IV SOLN
INTRAVENOUS | Status: AC
  Filled 2020-03-13: qty 2

## 2020-03-13 MED ORDER — HEPARIN (PORCINE) IN NACL 1000-0.9 UT/500ML-% IV SOLN
INTRAVENOUS | Status: DC | PRN
  Administered 2020-03-13 (×2): 500 mL

## 2020-03-13 MED ORDER — CLOPIDOGREL BISULFATE 300 MG PO TABS
ORAL_TABLET | ORAL | Status: AC
  Filled 2020-03-13: qty 2

## 2020-03-13 MED ORDER — HEPARIN (PORCINE) IN NACL 1000-0.9 UT/500ML-% IV SOLN
INTRAVENOUS | Status: AC
  Filled 2020-03-13: qty 1000

## 2020-03-13 MED ORDER — CLOPIDOGREL BISULFATE 75 MG PO TABS
75.0000 mg | ORAL_TABLET | Freq: Every day | ORAL | Status: DC
  Administered 2020-03-14 – 2020-03-16 (×3): 75 mg via ORAL
  Filled 2020-03-13 (×4): qty 1

## 2020-03-13 MED ORDER — HEPARIN SODIUM (PORCINE) 5000 UNIT/ML IJ SOLN
5000.0000 [IU] | Freq: Three times a day (TID) | INTRAMUSCULAR | Status: DC
  Administered 2020-03-14 – 2020-03-16 (×8): 5000 [IU] via SUBCUTANEOUS
  Filled 2020-03-13 (×8): qty 1

## 2020-03-13 SURGICAL SUPPLY — 23 items
BALLN EMERGE MR 2.5X8 (BALLOONS) ×2
BALLN SAPPHIRE ~~LOC~~ 3.0X8 (BALLOONS) ×1 IMPLANT
BALLN SAPPHIRE ~~LOC~~ 3.25X10 (BALLOONS) ×1 IMPLANT
BALLN WOLVERINE 2.50X10 (BALLOONS) ×2
BALLOON EMERGE MR 2.5X8 (BALLOONS) IMPLANT
BALLOON WOLVERINE 2.50X10 (BALLOONS) IMPLANT
CATH 5FR JL3.5 JR4 ANG PIG MP (CATHETERS) ×1 IMPLANT
CATH LAUNCHER 6FR EBU3.5 (CATHETERS) ×1 IMPLANT
CATH OPTICROSS HD (CATHETERS) ×1 IMPLANT
DEVICE RAD COMP TR BAND LRG (VASCULAR PRODUCTS) ×1 IMPLANT
GLIDESHEATH SLEND SS 6F .021 (SHEATH) ×1 IMPLANT
GUIDEWIRE INQWIRE 1.5J.035X260 (WIRE) IMPLANT
INQWIRE 1.5J .035X260CM (WIRE) ×2
KIT ENCORE 26 ADVANTAGE (KITS) ×1 IMPLANT
KIT HEART LEFT (KITS) ×2 IMPLANT
PACK CARDIAC CATHETERIZATION (CUSTOM PROCEDURE TRAY) ×2 IMPLANT
SLED PULL BACK IVUS (MISCELLANEOUS) ×1 IMPLANT
STENT RESOLUTE ONYX 2.75X12 (Permanent Stent) ×1 IMPLANT
STENT RESOLUTE ONYX 3.0X12 (Permanent Stent) ×1 IMPLANT
TRANSDUCER W/STOPCOCK (MISCELLANEOUS) ×2 IMPLANT
TUBING CIL FLEX 10 FLL-RA (TUBING) ×2 IMPLANT
WIRE HI TORQ VERSACORE-J 145CM (WIRE) ×1 IMPLANT
WIRE RUNTHROUGH .014X180CM (WIRE) ×1 IMPLANT

## 2020-03-13 NOTE — Plan of Care (Signed)

## 2020-03-13 NOTE — NC FL2 (Signed)
[place] LEVEL OF CARE SCREENING TOOL     IDENTIFICATION  Patient Name: Ryan Humphrey Birthdate: 1928-10-12 Sex: male Admission Date (Current Location): 03/11/2020  [facility] and [place] Number:  Herbalist and Address:  [facility] [place], [place] [address] [place], [place] [address]      Provider Number: [id number]  Attending Physician Name and Address:  Geralynn Rile, MD  Relative Name and Phone Number:  [name] (daughter) [phone]    Current Level of Care: Hospital Recommended Level of Care: [place] Prior Approval Number:    Date Approved/Denied:   PASRR Number: [id number]  Discharge Plan: SNF    Current Diagnoses: Patient Active Problem List   Diagnosis Date Noted  . NSTEMI (non-ST elevated myocardial infarction) ([place]) 03/12/2020  . Alzheimer's dementia without behavioral disturbance ([place]) 09/30/2019  . CKD (chronic kidney disease) stage 3, GFR 30-59 ml/min (HCC) 08/17/2017  . History of TIA (transient ischemic attack) 11/05/2016  . Vitamin B12 deficiency 11/05/2016  . GERD without esophagitis 11/05/2016  . Hereditary and idiopathic peripheral neuropathy 08/20/2015  . Benign essential tremor 08/20/2015  . Vitamin D deficiency 01/06/2015  . Medication management 01/06/2015  . Primary osteoarthritis of right knee 11/25/2014  . Malignant neoplasm of prostate ([place]) 03/28/2011  . INTERMITTENT VERTIGO 10/12/2009  . Mixed hyperlipidemia 04/10/2009  . Essential hypertension, benign 04/10/2009  . CORONARY ATHEROSCLEROSIS NATIVE CORONARY ARTERY 04/10/2009    Orientation RESPIRATION BLADDER Height & Weight     Self  O2 (2L nasal cannula) Continent Weight: 155 lb 3.3 oz (70.4 kg) Height:  5\' 10"  (177.8 cm)  BEHAVIORAL SYMPTOMS/MOOD NEUROLOGICAL BOWEL NUTRITION STATUS      Continent Diet (See DC summary)  AMBULATORY STATUS COMMUNICATION OF NEEDS Skin   Limited Assist Verbally Normal                        Personal Care Assistance Level of Assistance  Bathing, Feeding, Dressing Bathing Assistance: Limited assistance Feeding assistance: Limited assistance Dressing Assistance: Limited assistance     Functional Limitations Info             SPECIAL CARE FACTORS FREQUENCY  OT (By licensed OT), PT (By licensed PT)     PT Frequency: 5x per week OT Frequency: 5x per week            Contractures      Additional Factors Info  Code Status, Allergies Code Status Info: FULL Allergies Info: Niacin, Penicillins, Tramadol           Current Medications (03/13/2020):  This is the current hospital active medication list Current Facility-Administered Medications  Medication Dose Route Frequency Provider Last Rate Last Admin  . 0.9 %  sodium chloride infusion   Intravenous Continuous End, Christopher, MD      . 0.9 %  sodium chloride infusion  250 mL Intravenous PRN End, Harrell Gave, MD      . acetaminophen (TYLENOL) tablet 650 mg  650 mg Oral Q4H PRN End, Harrell Gave, MD      . Derrill Memo ON 03/14/2020] aspirin EC tablet 81 mg  81 mg Oral Daily End, Christopher, MD      . atorvastatin (LIPITOR) tablet 80 mg  80 mg Oral Daily End, Christopher, MD   80 mg at 03/13/20 0941  . cholecalciferol (VITAMIN D3) tablet 2,000 Units  2,000 Units Oral Daily End, Christopher, MD   2,000 Units at 03/13/20 0941  . [START ON 03/14/2020]  clopidogrel (PLAVIX) tablet 75 mg  75 mg Oral Q breakfast End, Christopher, MD      . gabapentin (NEURONTIN) capsule 400 mg  400 mg Oral TID End, Christopher, MD   400 mg at 03/13/20 0941  . [START ON 03/14/2020] heparin injection 5,000 Units  5,000 Units Subcutaneous Q8H End, Christopher, MD      . hydrALAZINE (APRESOLINE) injection 10 mg  10 mg Intravenous Q20 Min PRN End, Harrell Gave, MD      . labetalol (NORMODYNE) injection 10 mg  10 mg Intravenous Q10 min PRN End, Harrell Gave, MD      . metoprolol tartrate (LOPRESSOR) tablet 25 mg  25 mg Oral BID End, Christopher,  MD   25 mg at 03/13/20 0941  . nitroGLYCERIN (NITROSTAT) SL tablet 0.4 mg  0.4 mg Sublingual Q5 min PRN End, Harrell Gave, MD      . ondansetron (ZOFRAN) injection 4 mg  4 mg Intravenous Q6H PRN End, Harrell Gave, MD   4 mg at 03/13/20 1016  . pantoprazole (PROTONIX) EC tablet 40 mg  40 mg Oral Daily End, Christopher, MD   40 mg at 03/13/20 0942  . sertraline (ZOLOFT) tablet 100 mg  100 mg Oral Daily End, Christopher, MD   100 mg at 03/13/20 0941  . sodium chloride flush (NS) 0.9 % injection 3 mL  3 mL Intravenous Q12H End, Christopher, MD   3 mL at 03/13/20 0944  . sodium chloride flush (NS) 0.9 % injection 3 mL  3 mL Intravenous Q12H End, Christopher, MD      . sodium chloride flush (NS) 0.9 % injection 3 mL  3 mL Intravenous PRN End, Harrell Gave, MD         Discharge Medications: Please see discharge summary for a list of discharge medications.  Relevant Imaging Results:  Relevant Lab Results:   Additional Information SSN: [id number]  Glennon Hamilton, Student-Social Work

## 2020-03-13 NOTE — Interval H&P Note (Signed)
History and Physical Interval Note:  03/13/2020 2:06 PM  Ryan Humphrey  has presented today for surgery, with the diagnosis of NSTEMI.  The various methods of treatment have been discussed with the patient and family. After consideration of risks, benefits and other options for treatment, the patient has consented to  Procedure(s): LEFT HEART CATH AND CORONARY ANGIOGRAPHY (N/A) as a surgical intervention.  The patient's history has been reviewed, patient examined, no change in status, stable for surgery.  I have reviewed the patient's chart and labs.  Questions were answered to the patient's satisfaction.    Cath Lab Visit (complete for each Cath Lab visit)  Clinical Evaluation Leading to the Procedure:   ACS: Yes.    Non-ACS:  N/A  [name]

## 2020-03-13 NOTE — Progress Notes (Signed)
Progress Note  Patient Name: Ryan Humphrey Date of Encounter: 03/13/2020  [facility] Cardiologist: Elouise Munroe, MD   Subjective   Feeling well. No chest pain, sob or palpitations.   Inpatient Medications    Scheduled Meds: . [START ON 03/14/2020] aspirin EC  81 mg Oral Daily  . atorvastatin  80 mg Oral Daily  . cholecalciferol  2,000 Units Oral Daily  . gabapentin  400 mg Oral TID  . metoprolol tartrate  25 mg Oral BID  . pantoprazole  40 mg Oral Daily  . sertraline  100 mg Oral Daily  . sodium chloride flush  3 mL Intravenous Q12H   Continuous Infusions: . sodium chloride    . sodium chloride 1 mL/kg/hr (03/12/20 2307)  . heparin 900 Units/hr (03/13/20 0200)   PRN Meds: sodium chloride, acetaminophen, nitroGLYCERIN, ondansetron (ZOFRAN) IV, sodium chloride flush   Vital Signs    Vitals:   03/13/20 0321 03/13/20 0700 03/13/20 0941 03/13/20 0942  BP: (!) 110/56 (!) 109/52 (!) 114/51 (!) 114/51  Pulse: 65 66 65   Resp: 14 20  13   Temp: 97.7 F (36.5 C) 98.1 F (36.7 C)    TempSrc: Oral Oral    SpO2: 100% 96%    Weight:      Height:        Intake/Output Summary (Last 24 hours) at 03/13/2020 0956 Last data filed at 03/13/2020 0944 Gross per 24 hour  Intake 471.08 ml  Output 825 ml  Net -353.92 ml   Last 3 Weights 03/12/2020 03/12/2020 02/26/2020  Weight (lbs) 155 lb 3.3 oz 158 lb 12.8 oz 158 lb 12.8 oz  Weight (kg) 70.4 kg 72.031 kg 72.031 kg      Telemetry    NSR - Personally Reviewed  ECG    NSR, RBBB- Personally Reviewed  Physical Exam   GEN: No acute distress.   Neck: No JVD Cardiac: RRR, soft murmurs, rubs, or gallops.  Respiratory: Clear to auscultation bilaterally. GI: Soft, nontender, non-distended  MS: No edema; No deformity. Neuro:  Nonfocal  Psych: Normal affect   Labs    High Sensitivity Troponin:   Recent Labs  Lab 03/12/20 0436 03/12/20 1520 03/12/20 1730 03/12/20 2317 03/13/20 0045  TROPONINIHS 310* 178* 166*  136* 130*      Chemistry Recent Labs  Lab 03/11/20 2307 03/13/20 0045 03/13/20 0439  NA 142 150* 138  K 4.5 3.4* 3.9  CL 106 112* 106  CO2 24 24 25   GLUCOSE 110* 160* 102*  BUN 22 72* 23  CREATININE 1.63* 2.43* 1.61*  CALCIUM 9.6 8.7* 8.6*  GFRNONAA 37* 23* 38*  GFRAA 43* 27* 44*  ANIONGAP 12 14 7      Hematology Recent Labs  Lab 03/11/20 2307 03/13/20 0045  WBC 8.6 6.5  RBC 4.10* 3.46*  HGB 13.0 11.5*  HCT 39.3 33.7*  MCV 95.9 97.4  MCH 31.7 33.2  MCHC 33.1 34.1  RDW 12.6 12.7  PLT 152 112*     Radiology    DG Chest 2 View  Result Date: 03/11/2020 CLINICAL DATA:  Chest pain EXAM: CHEST - 2 VIEW COMPARISON:  09/30/2019 FINDINGS: The heart size and mediastinal contours are within normal limits. Both lungs are clear. The visualized skeletal structures are unremarkable. Aortic atherosclerosis. Degenerative changes of the spine. IMPRESSION: No active cardiopulmonary disease. Electronically Signed   By: Donavan Foil M.D.   On: 03/11/2020 22:54   ECHOCARDIOGRAM COMPLETE  Result Date: 03/12/2020    ECHOCARDIOGRAM REPORT  Patient Name:   Ryan Humphrey Date of Exam: 03/12/2020 Medical Rec #:  [id number]      Height:       70.0 in Accession #:    [id number]     Weight:       158.8 lb Date of Birth:  07/17/1928     BSA:          1.892 m Patient Age:    86 years       BP:           154/68 mmHg Patient Gender: M              HR:           64 bpm. Exam Location:  Inpatient Procedure: 2D Echo and Intracardiac Opacification Agent Indications:    NSTEMI I21.4  History:        Patient has prior history of Echocardiogram examinations, most                 recent 09/30/2019. CAD, TIA, Signs/Symptoms:Chest Pain and                 Vascular dementia; Risk Factors:Hypertension and Dyslipidemia.                 Chronic kidney disease,.  Sonographer:    Darlina Sicilian RDCS Referring Phys: [id number] [place]  1. Difficult study due to poor visualization of the endocardial  border despite use of IV contrast agent. Based on limited views, no wall motion abnormalities noted. LVEF appears to be normal at estimated 55-60%. Left ventricular diastolic parameters are  indeterminate.  2. Right ventricular systolic function is normal. The right ventricular size is normal.  3. The aortic valve is tricuspid. Aortic valve regurgitation is mild to moderate.  4. Aortic dilatation noted. There is mild dilatation of the aortic root, measuring 41 mm.  5. The inferior vena cava is dilated in size with >50% respiratory variability, suggesting right atrial pressure of 8 mmHg.  6. The mitral valve is normal in structure. Trivial mitral valve regurgitation. Comparison(s): Compared to prior TTE on 09/30/19, there appears to be mild-to-moderate aortic regurgitation. FINDINGS  Left Ventricle: Difficult study due to poor visualization of the endocardial border despite use of IV contrast agent. Based on limited views, no wall motion abnormalities noted. LVEF appears to be normal at estimated 55-60%.. Definity contrast agent was  given IV to delineate the left ventricular endocardial borders. The left ventricular internal cavity size was normal in size. There is no left ventricular hypertrophy. Left ventricular diastolic parameters are indeterminate. Right Ventricle: The right ventricular size is normal. Right vetricular wall thickness was not assessed. Right ventricular systolic function is normal. Left Atrium: Left atrial size was normal in size. Right Atrium: Right atrial size was not well visualized. Pericardium: There is no evidence of pericardial effusion. Mitral Valve: The mitral valve is normal in structure. Trivial mitral valve regurgitation. Tricuspid Valve: The tricuspid valve is normal in structure. Tricuspid valve regurgitation is trivial. Aortic Valve: The aortic valve is tricuspid. Aortic valve regurgitation is mild to moderate. Pulmonic Valve: The pulmonic valve was normal in structure. Pulmonic  valve regurgitation is not visualized. Aorta: Aortic dilatation noted. There is mild dilatation of the aortic root, measuring 41 mm. Venous: The inferior vena cava is dilated in size with greater than 50% respiratory variability, suggesting right atrial pressure of 8 mmHg. IAS/Shunts: No atrial level shunt detected by color flow Doppler.  LEFT VENTRICLE PLAX  2D LVOT diam:     1.90 cm  Diastology LV SV:         40       LV e' medial:    4.35 cm/s LV SV Index:   21       LV E/e' medial:  13.1 LVOT Area:     2.84 cm LV e' lateral:   6.15 cm/s                         LV E/e' lateral: 9.3  RIGHT VENTRICLE RV S prime:     3.60 cm/s LEFT ATRIUM             Index LA Vol (A2C):   43.3 ml 22.88 ml/m LA Vol (A4C):   44.8 ml 23.67 ml/m LA Biplane Vol: 45.0 ml 23.78 ml/m  AORTIC VALVE LVOT Vmax:   58.10 cm/s LVOT Vmean:  37.300 cm/s LVOT VTI:    0.141 m AI PHT:      521 msec  AORTA Ao Root diam: 4.10 cm MITRAL VALVE MV Area (PHT): 2.69 cm    SHUNTS MV Decel Time: 282 msec    Systemic VTI:  0.14 m MV E velocity: 57.00 cm/s  Systemic Diam: 1.90 cm MV A velocity: 79.70 cm/s MV E/A ratio:  0.72 Gwyndolyn Kaufman MD Electronically signed by Gwyndolyn Kaufman MD Signature Date/Time: 03/12/2020/6:14:48 PM    Final     Cardiac Studies   Echo 03/12/2020 1. Difficult study due to poor visualization of the endocardial border  despite use of IV contrast agent. Based on limited views, no wall motion  abnormalities noted. LVEF appears to be normal at estimated 55-60%. Left  ventricular diastolic parameters are  indeterminate.  2. Right ventricular systolic function is normal. The right ventricular  size is normal.  3. The aortic valve is tricuspid. Aortic valve regurgitation is mild to  moderate.  4. Aortic dilatation noted. There is mild dilatation of the aortic root,  measuring 41 mm.  5. The inferior vena cava is dilated in size with >50% respiratory  variability, suggesting right atrial pressure of 8 mmHg.  6.  The mitral valve is normal in structure. Trivial mitral valve  regurgitation.   Comparison(s): Compared to prior TTE on 09/30/19, there appears to be  mild-to-moderate aortic regurgitation.   Patient Profile     86 y.o. male  male with hx of non obstructive CAD, HLD, hypertension, dementia, CKD III and prostate cancer presented for chest pain evaluation and found to have elevated troponin.   Assessment & Plan    1. NSTEMI - Peak of troponin was 310 then trended down. EKG with non specific changes. Treated with heparin. Echo showed LVEF of 55-60% and grade 2 DD. Plan to cath later today.renal function stable. Risk and benefits discussed by Dr. Audie Box yesterday with patient and daughter. Continue ASA, statin and BB.   2. CKD IIIB - Renal function stable.  - Hydrated overnight - Watch renal function post cath  3. Aortic regurgitation/mild dilated aortic root - Echo showed mild to moderate AI and 20mm mild dilated aortic root  4. HTN - Propranolol changed to metoprolol 25mg  BID on admit  5. HLD - 09/30/2019: VLDL 10 02/26/2020: Cholesterol 171; HDL 41; LDL Cholesterol (Calc) 104; Triglycerides 151  - Zocor changed to Lipitor  6. Dementia - At baseline   For questions or updates, please contact [place] Please consult [email or web address] for contact info under  Jarrett Soho, PA  03/13/2020, 9:56 AM

## 2020-03-13 NOTE — Brief Op Note (Signed)
BRIEF CARDIAC CATHETERIZATION NOTE  03/13/2020  3:57 PM  PATIENT:  Ryan Humphrey  85 y.o. male  PRE-OPERATIVE DIAGNOSIS:  NSTEMI  POST-OPERATIVE DIAGNOSIS:  NSTEMI  PROCEDURE:  Procedure(s): LEFT HEART CATH AND CORONARY ANGIOGRAPHY (N/A) CORONARY STENT INTERVENTION (N/A) Intravascular Ultrasound/IVUS (N/A)  SURGEON:  Surgeon(s) and Role:    * Mikhaila Roh, Harrell Gave, MD - Primary  FINDINGS: 1. Severe single-vessel CAD with multifocal proximal through distal LAD disease of up to 95%.  Mild, non-obstructive CAD involving LCx and RCA. 2. Normal left ventricular filling pressure. 3. Successful IVUS-guided PCI to proximal and mid LAD using overlapping Resolute Onyx 3.0 x 12 mm (proximal) and 2.75 x 12 mm (distal) drug-eluting stents with 0% residual stenosis and TIMI-3 flow.  RECOMMENDATIONS: 1. DAPT with aspirin and clopidogrel for 12 months (if tolerated). 2. Aggressive secondary prevention. 3. Gentle post-catheterization hydration with continued monitoring of renal function.  Nelva Bush, MD [facility]

## 2020-03-13 NOTE — H&P (View-Only) (Signed)
Progress Note  Patient Name: Ryan Humphrey Date of Encounter: 03/13/2020  [facility] Cardiologist: Elouise Munroe, MD   Subjective   Feeling well. No chest pain, sob or palpitations.   Inpatient Medications    Scheduled Meds: . [START ON 03/14/2020] aspirin EC  81 mg Oral Daily  . atorvastatin  80 mg Oral Daily  . cholecalciferol  2,000 Units Oral Daily  . gabapentin  400 mg Oral TID  . metoprolol tartrate  25 mg Oral BID  . pantoprazole  40 mg Oral Daily  . sertraline  100 mg Oral Daily  . sodium chloride flush  3 mL Intravenous Q12H   Continuous Infusions: . sodium chloride    . sodium chloride 1 mL/kg/hr (03/12/20 2307)  . heparin 900 Units/hr (03/13/20 0200)   PRN Meds: sodium chloride, acetaminophen, nitroGLYCERIN, ondansetron (ZOFRAN) IV, sodium chloride flush   Vital Signs    Vitals:   03/13/20 0321 03/13/20 0700 03/13/20 0941 03/13/20 0942  BP: (!) 110/56 (!) 109/52 (!) 114/51 (!) 114/51  Pulse: 65 66 65   Resp: 14 20  13   Temp: 97.7 F (36.5 C) 98.1 F (36.7 C)    TempSrc: Oral Oral    SpO2: 100% 96%    Weight:      Height:        Intake/Output Summary (Last 24 hours) at 03/13/2020 0956 Last data filed at 03/13/2020 0944 Gross per 24 hour  Intake 471.08 ml  Output 825 ml  Net -353.92 ml   Last 3 Weights 03/12/2020 03/12/2020 02/26/2020  Weight (lbs) 155 lb 3.3 oz 158 lb 12.8 oz 158 lb 12.8 oz  Weight (kg) 70.4 kg 72.031 kg 72.031 kg      Telemetry    NSR - Personally Reviewed  ECG    NSR, RBBB- Personally Reviewed  Physical Exam   GEN: No acute distress.   Neck: No JVD Cardiac: RRR, soft murmurs, rubs, or gallops.  Respiratory: Clear to auscultation bilaterally. GI: Soft, nontender, non-distended  MS: No edema; No deformity. Neuro:  Nonfocal  Psych: Normal affect   Labs    High Sensitivity Troponin:   Recent Labs  Lab 03/12/20 0436 03/12/20 1520 03/12/20 1730 03/12/20 2317 03/13/20 0045  TROPONINIHS 310* 178* 166*  136* 130*      Chemistry Recent Labs  Lab 03/11/20 2307 03/13/20 0045 03/13/20 0439  NA 142 150* 138  K 4.5 3.4* 3.9  CL 106 112* 106  CO2 24 24 25   GLUCOSE 110* 160* 102*  BUN 22 72* 23  CREATININE 1.63* 2.43* 1.61*  CALCIUM 9.6 8.7* 8.6*  GFRNONAA 37* 23* 38*  GFRAA 43* 27* 44*  ANIONGAP 12 14 7      Hematology Recent Labs  Lab 03/11/20 2307 03/13/20 0045  WBC 8.6 6.5  RBC 4.10* 3.46*  HGB 13.0 11.5*  HCT 39.3 33.7*  MCV 95.9 97.4  MCH 31.7 33.2  MCHC 33.1 34.1  RDW 12.6 12.7  PLT 152 112*     Radiology    DG Chest 2 View  Result Date: 03/11/2020 CLINICAL DATA:  Chest pain EXAM: CHEST - 2 VIEW COMPARISON:  09/30/2019 FINDINGS: The heart size and mediastinal contours are within normal limits. Both lungs are clear. The visualized skeletal structures are unremarkable. Aortic atherosclerosis. Degenerative changes of the spine. IMPRESSION: No active cardiopulmonary disease. Electronically Signed   By: Donavan Foil M.D.   On: 03/11/2020 22:54   ECHOCARDIOGRAM COMPLETE  Result Date: 03/12/2020    ECHOCARDIOGRAM REPORT  Patient Name:   ELIASAR HLAVATY Date of Exam: 03/12/2020 Medical Rec #:  [id number]      Height:       70.0 in Accession #:    [id number]     Weight:       158.8 lb Date of Birth:  12-Apr-1929     BSA:          1.892 m Patient Age:    86 years       BP:           154/68 mmHg Patient Gender: M              HR:           64 bpm. Exam Location:  Inpatient Procedure: 2D Echo and Intracardiac Opacification Agent Indications:    NSTEMI I21.4  History:        Patient has prior history of Echocardiogram examinations, most                 recent 09/30/2019. CAD, TIA, Signs/Symptoms:Chest Pain and                 Vascular dementia; Risk Factors:Hypertension and Dyslipidemia.                 Chronic kidney disease,.  Sonographer:    Darlina Sicilian RDCS Referring Phys: [id number] [place]  1. Difficult study due to poor visualization of the endocardial  border despite use of IV contrast agent. Based on limited views, no wall motion abnormalities noted. LVEF appears to be normal at estimated 55-60%. Left ventricular diastolic parameters are  indeterminate.  2. Right ventricular systolic function is normal. The right ventricular size is normal.  3. The aortic valve is tricuspid. Aortic valve regurgitation is mild to moderate.  4. Aortic dilatation noted. There is mild dilatation of the aortic root, measuring 41 mm.  5. The inferior vena cava is dilated in size with >50% respiratory variability, suggesting right atrial pressure of 8 mmHg.  6. The mitral valve is normal in structure. Trivial mitral valve regurgitation. Comparison(s): Compared to prior TTE on 09/30/19, there appears to be mild-to-moderate aortic regurgitation. FINDINGS  Left Ventricle: Difficult study due to poor visualization of the endocardial border despite use of IV contrast agent. Based on limited views, no wall motion abnormalities noted. LVEF appears to be normal at estimated 55-60%.. Definity contrast agent was  given IV to delineate the left ventricular endocardial borders. The left ventricular internal cavity size was normal in size. There is no left ventricular hypertrophy. Left ventricular diastolic parameters are indeterminate. Right Ventricle: The right ventricular size is normal. Right vetricular wall thickness was not assessed. Right ventricular systolic function is normal. Left Atrium: Left atrial size was normal in size. Right Atrium: Right atrial size was not well visualized. Pericardium: There is no evidence of pericardial effusion. Mitral Valve: The mitral valve is normal in structure. Trivial mitral valve regurgitation. Tricuspid Valve: The tricuspid valve is normal in structure. Tricuspid valve regurgitation is trivial. Aortic Valve: The aortic valve is tricuspid. Aortic valve regurgitation is mild to moderate. Pulmonic Valve: The pulmonic valve was normal in structure. Pulmonic  valve regurgitation is not visualized. Aorta: Aortic dilatation noted. There is mild dilatation of the aortic root, measuring 41 mm. Venous: The inferior vena cava is dilated in size with greater than 50% respiratory variability, suggesting right atrial pressure of 8 mmHg. IAS/Shunts: No atrial level shunt detected by color flow Doppler.  LEFT VENTRICLE PLAX  2D LVOT diam:     1.90 cm  Diastology LV SV:         40       LV e' medial:    4.35 cm/s LV SV Index:   21       LV E/e' medial:  13.1 LVOT Area:     2.84 cm LV e' lateral:   6.15 cm/s                         LV E/e' lateral: 9.3  RIGHT VENTRICLE RV S prime:     3.60 cm/s LEFT ATRIUM             Index LA Vol (A2C):   43.3 ml 22.88 ml/m LA Vol (A4C):   44.8 ml 23.67 ml/m LA Biplane Vol: 45.0 ml 23.78 ml/m  AORTIC VALVE LVOT Vmax:   58.10 cm/s LVOT Vmean:  37.300 cm/s LVOT VTI:    0.141 m AI PHT:      521 msec  AORTA Ao Root diam: 4.10 cm MITRAL VALVE MV Area (PHT): 2.69 cm    SHUNTS MV Decel Time: 282 msec    Systemic VTI:  0.14 m MV E velocity: 57.00 cm/s  Systemic Diam: 1.90 cm MV A velocity: 79.70 cm/s MV E/A ratio:  0.72 Gwyndolyn Kaufman MD Electronically signed by Gwyndolyn Kaufman MD Signature Date/Time: 03/12/2020/6:14:48 PM    Final     Cardiac Studies   Echo 03/12/2020 1. Difficult study due to poor visualization of the endocardial border  despite use of IV contrast agent. Based on limited views, no wall motion  abnormalities noted. LVEF appears to be normal at estimated 55-60%. Left  ventricular diastolic parameters are  indeterminate.  2. Right ventricular systolic function is normal. The right ventricular  size is normal.  3. The aortic valve is tricuspid. Aortic valve regurgitation is mild to  moderate.  4. Aortic dilatation noted. There is mild dilatation of the aortic root,  measuring 41 mm.  5. The inferior vena cava is dilated in size with >50% respiratory  variability, suggesting right atrial pressure of 8 mmHg.  6.  The mitral valve is normal in structure. Trivial mitral valve  regurgitation.   Comparison(s): Compared to prior TTE on 09/30/19, there appears to be  mild-to-moderate aortic regurgitation.   Patient Profile     86 y.o. male  male with hx of non obstructive CAD, HLD, hypertension, dementia, CKD III and prostate cancer presented for chest pain evaluation and found to have elevated troponin.   Assessment & Plan    1. NSTEMI - Peak of troponin was 310 then trended down. EKG with non specific changes. Treated with heparin. Echo showed LVEF of 55-60% and grade 2 DD. Plan to cath later today.renal function stable. Risk and benefits discussed by Dr. Audie Box yesterday with patient and daughter. Continue ASA, statin and BB.   2. CKD IIIB - Renal function stable.  - Hydrated overnight - Watch renal function post cath  3. Aortic regurgitation/mild dilated aortic root - Echo showed mild to moderate AI and 17mm mild dilated aortic root  4. HTN - Propranolol changed to metoprolol 25mg  BID on admit  5. HLD - 09/30/2019: VLDL 10 02/26/2020: Cholesterol 171; HDL 41; LDL Cholesterol (Calc) 104; Triglycerides 151  - Zocor changed to Lipitor  6. Dementia - At baseline   For questions or updates, please contact [place] Please consult [email or web address] for contact info under  Jarrett Soho, PA  03/13/2020, 9:56 AM

## 2020-03-13 NOTE — Progress Notes (Signed)
TR band removed and pressure dressing applied. Pt educated on restrictions and verbalized understanding. Pt is confused so RN will continue to re-educate pt. Will continue to monitor.

## 2020-03-14 LAB — BASIC METABOLIC PANEL
Anion gap: 9 (ref 5–15)
BUN: 18 mg/dL (ref 8–23)
CO2: 21 mmol/L — ABNORMAL LOW (ref 22–32)
Calcium: 8.2 mg/dL — ABNORMAL LOW (ref 8.9–10.3)
Chloride: 109 mmol/L (ref 98–111)
Creatinine, Ser: 1.52 mg/dL — ABNORMAL HIGH (ref 0.61–1.24)
GFR calc Af Amer: 47 mL/min — ABNORMAL LOW (ref >60)
GFR calc non Af Amer: 40 mL/min — ABNORMAL LOW (ref >60)
Glucose, Bld: 96 mg/dL (ref 70–99)
Potassium: 3.7 mmol/L (ref 3.5–5.1)
Sodium: 139 mmol/L (ref 135–145)

## 2020-03-14 LAB — CBC
HCT: 32.6 % — ABNORMAL LOW (ref 39.0–52.0)
Hemoglobin: 11 g/dL — ABNORMAL LOW (ref 13.0–17.0)
MCH: 32.6 pg (ref 26.0–34.0)
MCHC: 33.7 g/dL (ref 30.0–36.0)
MCV: 96.7 fL (ref 80.0–100.0)
Platelets: 116 10*3/uL — ABNORMAL LOW (ref 150–400)
RBC: 3.37 MIL/uL — ABNORMAL LOW (ref 4.22–5.81)
RDW: 12.8 % (ref 11.5–15.5)
WBC: 8.3 10*3/uL (ref 4.0–10.5)
nRBC: 0 % (ref 0.0–0.2)

## 2020-03-14 NOTE — Progress Notes (Signed)
CARDIAC REHAB PHASE I   [id number] Cardiac Rehab in to ambulate with patient. PT in room for eval and treat. Patient ambulated in hallway with walker and assisted to chair by PT. Recommendation for SNF. Patient also more appropriate for assisted living at [facility]. Currently in [place] however forgets to take medications. Patient with hx of dementia and has poor recollect and short term memory loss. Spoke with daughter [name] via telephone to provide discharge education. MI booklet, activity progression sheet, heart healthy diet information, and phase 2 cardiac rehab brochure left at bedside for daughters review upon arrival. Explained to [name] that referral would be placed to cardiac rehab phase 2 however patient is not appropriate at this time secondary to discharge to SNF. Patient may also not be appropriate for group exercise in the future secondary to assisted living status and transportation as well as impaired memory. All questions answered. Daughter verbalized understanding.  Barbar Brede Minus Breeding RN, BSN

## 2020-03-14 NOTE — Evaluation (Signed)
Physical Therapy Evaluation Patient Details Name: Ryan Humphrey MRN: [id number] DOB: 30-Aug-1928 Today's Date: 03/14/2020   History of Present Illness  86 yo admitted with NSTEMI s/p cath 10/1. PMhx: dementia, CAD, HLD, HTN, CKD, prostate CA, Rt TKA  Clinical Impression  Pt very pleasant with decreased memory, safety and function. Pt reports he walks independently in apartment, stands to shower and dress and goes to dining hall for meals. Pt with good mobility with RW and supervision and recommend ALF with HHPT at D/C. Pt with decreased mobility and cognition who will benefit from acute therapy to maximize independence and safety.   HR 75-85 SpO2 97% on RA Pre gait 120/50 (71) Post gait 114/60 (75)    Follow Up Recommendations Home health PT;Supervision/Assistance - 24 hour (recommend ALF not ILF)    Equipment Recommendations  Rolling walker with 5" wheels    Recommendations for Other Services       Precautions / Restrictions Precautions Precautions: Fall      Mobility  Bed Mobility Overal bed mobility: Modified Independent                Transfers Overall transfer level: Needs assistance   Transfers: Sit to/from Stand Sit to Stand: Supervision         General transfer comment: supervision for safety with cues for hand placement  Ambulation/Gait Ambulation/Gait assistance: Supervision Gait Distance (Feet): 400 Feet Assistive device: Rolling walker (2 wheeled) Gait Pattern/deviations: Step-through pattern;Decreased stride length;Trunk flexed   Gait velocity interpretation: >4.37 ft/sec, indicative of normal walking speed General Gait Details: cues for posture and proximity to [employer] Mobility    Modified Rankin (Stroke Patients Only)       Balance Overall balance assessment: Needs assistance   Sitting balance-Leahy Scale: Good       Standing balance-Leahy Scale: Fair Standing balance comment: pt able to stand  statically, unstable gait with benefit of RW for stability                             Pertinent Vitals/Pain Pain Assessment: No/denies pain    Home Living Family/patient expects to be discharged to:: Private residence Living Arrangements: Alone   Type of Home: Independent living facility Home Access: Level entry     Home Layout: One level Home Equipment: Cane - single point Additional Comments: pt lives at [facility]    Prior Function Level of Independence: Independent with assistive device(s)         Comments: uses a cane with tendency to lose it     Hand Dominance        Extremity/Trunk Assessment   Upper Extremity Assessment Upper Extremity Assessment: Overall WFL for tasks assessed    Lower Extremity Assessment Lower Extremity Assessment: Overall WFL for tasks assessed    Cervical / Trunk Assessment Cervical / Trunk Assessment: Kyphotic  Communication   Communication: No difficulties  Cognition Arousal/Alertness: Awake/alert Behavior During Therapy: WFL for tasks assessed/performed Overall Cognitive Status: Impaired/Different from baseline Area of Impairment: Memory;Safety/judgement                     Memory: Decreased short-term memory   Safety/Judgement: Decreased awareness of safety;Decreased awareness of deficits     General Comments: pt unable to recall room number within 30 sec. Pt with decreased awareness of deficits and safety. Pt with incontinent bowel on  arrival and unaware      General Comments      Exercises     Assessment/Plan    PT Assessment Patient needs continued PT services  PT Problem List Decreased mobility;Decreased activity tolerance;Decreased cognition;Decreased knowledge of use of DME;Decreased balance       PT Treatment Interventions Gait training;Balance training;Functional mobility training;Cognitive remediation;Therapeutic activities;DME instruction;Therapeutic exercise    PT Goals  (Current goals can be found in the Care Plan section)  Acute Rehab PT Goals Patient Stated Goal: return to my apartment and read PT Goal Formulation: With patient Time For Goal Achievement: 03/28/20 Potential to Achieve Goals: Good    Frequency Min 3X/week   Barriers to discharge Decreased caregiver support      Co-evaluation               AM-PAC PT "6 Clicks" Mobility  Outcome Measure Help needed turning from your back to your side while in a flat bed without using bedrails?: None Help needed moving from lying on your back to sitting on the side of a flat bed without using bedrails?: None Help needed moving to and from a bed to a chair (including a wheelchair)?: A Little Help needed standing up from a chair using your arms (e.g., wheelchair or bedside chair)?: A Little Help needed to walk in hospital room?: A Little Help needed climbing 3-5 steps with a railing? : A Little 6 Click Score: 20    End of Session Equipment Utilized During Treatment: Gait belt Activity Tolerance: Patient tolerated treatment well Patient left: in chair;with call bell/phone within reach;with chair alarm set Nurse Communication: Mobility status PT Visit Diagnosis: Other abnormalities of gait and mobility (R26.89);Difficulty in walking, not elsewhere classified (R26.2)    Time: 0881-1031 PT Time Calculation (min) (ACUTE ONLY): 21 min   Charges:   PT Evaluation $PT Eval Moderate Complexity: 1 Mod          Anntonette Madewell P, PT Acute Rehabilitation Services Pager: [phone] Office: [phone]   Sandy Salaam Nevin Kozuch 03/14/2020, 8:28 AM

## 2020-03-14 NOTE — Progress Notes (Signed)
Progress Note  Patient Name: Ryan Humphrey Date of Encounter: 03/14/2020  Primary Cardiologist: Elouise Munroe, MD  Subjective   Sitting in bedside chair.  States that he feels well, no chest pain or shortness of breath.  Reading a book.  Inpatient Medications    Scheduled Meds: . aspirin EC  81 mg Oral Daily  . atorvastatin  80 mg Oral Daily  . cholecalciferol  2,000 Units Oral Daily  . clopidogrel  75 mg Oral Q breakfast  . gabapentin  400 mg Oral TID  . heparin  5,000 Units Subcutaneous Q8H  . metoprolol tartrate  25 mg Oral BID  . pantoprazole  40 mg Oral Daily  . sertraline  100 mg Oral Daily  . sodium chloride flush  3 mL Intravenous Q12H  . sodium chloride flush  3 mL Intravenous Q12H   Continuous Infusions: . sodium chloride     PRN Meds: sodium chloride, acetaminophen, nitroGLYCERIN, ondansetron (ZOFRAN) IV, sodium chloride flush   Vital Signs    Vitals:   03/13/20 2339 03/14/20 0308 03/14/20 0400 03/14/20 0700  BP: (!) 109/50  (!) 95/53 (!) 97/50  Pulse: 68 71 66 70  Resp: 14 15 14 16   Temp: 97.7 F (36.5 C)  98.1 F (36.7 C) 98.1 F (36.7 C)  TempSrc: Oral  Oral Oral  SpO2: 94% 95% 96% 95%  Weight:      Height:        Intake/Output Summary (Last 24 hours) at 03/14/2020 0931 Last data filed at 03/14/2020 0800 Gross per 24 hour  Intake 2009.45 ml  Output 700 ml  Net 1309.45 ml   Filed Weights   03/12/20 1200 03/12/20 2145  Weight: 72 kg 70.4 kg    Telemetry    Sinus rhythm.  Personally reviewed.  ECG    An ECG dated 03/14/2020 was personally reviewed today and demonstrated:  Sinus rhythm with right bundle branch block.  Physical Exam   GEN:  Elderly male.  No acute distress.   Neck: No JVD. Cardiac: RRR, no gallop.  Respiratory: Nonlabored. Clear to auscultation bilaterally. GI: Soft, nontender, bowel sounds present. MS: No edema; No deformity. Neuro:  Nonfocal.  Labs    Chemistry Recent Labs  Lab 03/13/20 0045  03/13/20 0439 03/14/20 0059  NA 150* 138 139  K 3.4* 3.9 3.7  CL 112* 106 109  CO2 24 25 21*  GLUCOSE 160* 102* 96  BUN 72* 23 18  CREATININE 2.43* 1.61* 1.52*  CALCIUM 8.7* 8.6* 8.2*  GFRNONAA 23* 38* 40*  GFRAA 27* 44* 47*  ANIONGAP 14 7 9      Hematology Recent Labs  Lab 03/11/20 2307 03/13/20 0045 03/14/20 0059  WBC 8.6 6.5 8.3  RBC 4.10* 3.46* 3.37*  HGB 13.0 11.5* 11.0*  HCT 39.3 33.7* 32.6*  MCV 95.9 97.4 96.7  MCH 31.7 33.2 32.6  MCHC 33.1 34.1 33.7  RDW 12.6 12.7 12.8  PLT 152 112* 116*    Cardiac Enzymes Recent Labs  Lab 03/12/20 0436 03/12/20 1520 03/12/20 1730 03/12/20 2317 03/13/20 0045  TROPONINIHS 310* 178* 166* 136* 130*    Radiology    CARDIAC CATHETERIZATION  Addendum Date: 03/13/2020   Conclusions: 1. Severe single-vessel coronary artery disease with multifocal proximal through distal LAD disease of up to 95%.  Mild, non-obstructive CAD involving LCx and RCA is also present. 2. Upper normal left ventricular filling pressure. 3. Successful IVUS-guided PCI to proximal and mid LAD using overlapping Resolute Onyx 3.0 x 12  mm (proximal) and 2.75 x 12 mm (distal) drug-eluting stents with 0% residual stenosis and TIMI-3 flow.  Recommendations: 1. Dual antiplatelet therapy with aspirin and clopidogrel for 12 months (if tolerated). 2. Aggressive secondary prevention. 3. Gentle post-catheterization hydration with continued monitoring of renal function. Nelva Bush, MD [facility]   Result Date: 03/13/2020 Conclusions: 1. Severe single-vessel coronary artery disease with multifocal proximal through distal LAD disease of up to 95%.  Mild, non-obstructive CAD involving LCx and RCA is also present. 2. Upper normal left ventricular filling pressure. 3. Successful IVUS-guided PCI to proximal and mid LAD using overlapping Resolute Onyx 3.0 x 12 mm (proximal) and 2.75 x 12 mm (distal) drug-eluting stents with 0% residual stenosis and TIMI-3 flow.   Recommendations: 1. Dual antiplatelet therapy with aspirin and clopidogrel for 12 months (if tolerated). 2. Aggressive secondary prevention. 3. Gentle post-catheterization hydration with continued monitoring of renal function. Nelva Bush, MD [facility]   ECHOCARDIOGRAM COMPLETE  Result Date: 03/12/2020    ECHOCARDIOGRAM REPORT   Patient Name:   Ryan Humphrey Date of Exam: 03/12/2020 Medical Rec #:  [id number]      Height:       70.0 in Accession #:    [id number]     Weight:       158.8 lb Date of Birth:  1929-05-29     BSA:          1.892 m Patient Age:    86 years       BP:           154/68 mmHg Patient Gender: M              HR:           64 bpm. Exam Location:  Inpatient Procedure: 2D Echo and Intracardiac Opacification Agent Indications:    NSTEMI I21.4  History:        Patient has prior history of Echocardiogram examinations, most                 recent 09/30/2019. CAD, TIA, Signs/Symptoms:Chest Pain and                 Vascular dementia; Risk Factors:Hypertension and Dyslipidemia.                 Chronic kidney disease,.  Sonographer:    Darlina Sicilian RDCS Referring Phys: [id number] [place]  1. Difficult study due to poor visualization of the endocardial border despite use of IV contrast agent. Based on limited views, no wall motion abnormalities noted. LVEF appears to be normal at estimated 55-60%. Left ventricular diastolic parameters are  indeterminate.  2. Right ventricular systolic function is normal. The right ventricular size is normal.  3. The aortic valve is tricuspid. Aortic valve regurgitation is mild to moderate.  4. Aortic dilatation noted. There is mild dilatation of the aortic root, measuring 41 mm.  5. The inferior vena cava is dilated in size with >50% respiratory variability, suggesting right atrial pressure of 8 mmHg.  6. The mitral valve is normal in structure. Trivial mitral valve regurgitation. Comparison(s): Compared to prior TTE on 09/30/19, there  appears to be mild-to-moderate aortic regurgitation. FINDINGS  Left Ventricle: Difficult study due to poor visualization of the endocardial border despite use of IV contrast agent. Based on limited views, no wall motion abnormalities noted. LVEF appears to be normal at estimated 55-60%.. Definity contrast agent was  given IV to delineate the left ventricular endocardial borders. The left  ventricular internal cavity size was normal in size. There is no left ventricular hypertrophy. Left ventricular diastolic parameters are indeterminate. Right Ventricle: The right ventricular size is normal. Right vetricular wall thickness was not assessed. Right ventricular systolic function is normal. Left Atrium: Left atrial size was normal in size. Right Atrium: Right atrial size was not well visualized. Pericardium: There is no evidence of pericardial effusion. Mitral Valve: The mitral valve is normal in structure. Trivial mitral valve regurgitation. Tricuspid Valve: The tricuspid valve is normal in structure. Tricuspid valve regurgitation is trivial. Aortic Valve: The aortic valve is tricuspid. Aortic valve regurgitation is mild to moderate. Pulmonic Valve: The pulmonic valve was normal in structure. Pulmonic valve regurgitation is not visualized. Aorta: Aortic dilatation noted. There is mild dilatation of the aortic root, measuring 41 mm. Venous: The inferior vena cava is dilated in size with greater than 50% respiratory variability, suggesting right atrial pressure of 8 mmHg. IAS/Shunts: No atrial level shunt detected by color flow Doppler.  LEFT VENTRICLE PLAX 2D LVOT diam:     1.90 cm  Diastology LV SV:         40       LV e' medial:    4.35 cm/s LV SV Index:   21       LV E/e' medial:  13.1 LVOT Area:     2.84 cm LV e' lateral:   6.15 cm/s                         LV E/e' lateral: 9.3  RIGHT VENTRICLE RV S prime:     3.60 cm/s LEFT ATRIUM             Index LA Vol (A2C):   43.3 ml 22.88 ml/m LA Vol (A4C):   44.8 ml 23.67  ml/m LA Biplane Vol: 45.0 ml 23.78 ml/m  AORTIC VALVE LVOT Vmax:   58.10 cm/s LVOT Vmean:  37.300 cm/s LVOT VTI:    0.141 m AI PHT:      521 msec  AORTA Ao Root diam: 4.10 cm MITRAL VALVE MV Area (PHT): 2.69 cm    SHUNTS MV Decel Time: 282 msec    Systemic VTI:  0.14 m MV E velocity: 57.00 cm/s  Systemic Diam: 1.90 cm MV A velocity: 79.70 cm/s MV E/A ratio:  0.72 Gwyndolyn Kaufman MD Electronically signed by Gwyndolyn Kaufman MD Signature Date/Time: 03/12/2020/6:14:48 PM    Final     Patient Profile     86 y.o. male with a history of CAD, hyperlipidemia, hypertension, CKD stage III, prostate cancer, and dementia presenting with NSTEMI.  Assessment & Plan    1.  NSTEMI.  Clinically stable at this time without active chest pain and status post cardiac catheterization on October 1 demonstrating severe LAD disease managed with overlapping DES x2 to the proximal and mid LAD.  2.  CKD stage IIIb, creatinine 1.52 down from 2.43.  3.  Mixed hyperlipidemia, currently on Lipitor.  4.  Essential hypertension, systolics running 90 to 485.  5.  Mild aortic root dilatation at 41 mm with mild to moderate aortic regurgitation, asymptomatic.  6.  Dementia.  I reviewed hospital course and recommendations by Dr. Audie Box.  Anticipate discharge to assisted living facility, possibly first of the week.  He is currently on aspirin, Plavix, Lipitor, and Lopressor.  Signed, Rozann Lesches, MD  03/14/2020, 9:31 AM

## 2020-03-14 NOTE — Plan of Care (Signed)

## 2020-03-14 NOTE — Plan of Care (Signed)

## 2020-03-15 LAB — BASIC METABOLIC PANEL
Anion gap: 8 (ref 5–15)
BUN: 21 mg/dL (ref 8–23)
CO2: 23 mmol/L (ref 22–32)
Calcium: 8.4 mg/dL — ABNORMAL LOW (ref 8.9–10.3)
Chloride: 107 mmol/L (ref 98–111)
Creatinine, Ser: 1.88 mg/dL — ABNORMAL HIGH (ref 0.61–1.24)
GFR calc Af Amer: 36 mL/min — ABNORMAL LOW (ref >60)
GFR calc non Af Amer: 31 mL/min — ABNORMAL LOW (ref >60)
Glucose, Bld: 113 mg/dL — ABNORMAL HIGH (ref 70–99)
Potassium: 4 mmol/L (ref 3.5–5.1)
Sodium: 138 mmol/L (ref 135–145)

## 2020-03-15 LAB — CBC
HCT: 30.5 % — ABNORMAL LOW (ref 39.0–52.0)
Hemoglobin: 10.5 g/dL — ABNORMAL LOW (ref 13.0–17.0)
MCH: 33.4 pg (ref 26.0–34.0)
MCHC: 34.4 g/dL (ref 30.0–36.0)
MCV: 97.1 fL (ref 80.0–100.0)
Platelets: 103 10*3/uL — ABNORMAL LOW (ref 150–400)
RBC: 3.14 MIL/uL — ABNORMAL LOW (ref 4.22–5.81)
RDW: 12.9 % (ref 11.5–15.5)
WBC: 7.5 10*3/uL (ref 4.0–10.5)
nRBC: 0 % (ref 0.0–0.2)

## 2020-03-15 NOTE — Evaluation (Signed)
Occupational Therapy Evaluation Patient Details Name: HRIDAAN BOUSE MRN: [id number] DOB: 1929/03/13 Today's Date: 03/15/2020    History of Present Illness 86 yo admitted with NSTEMI s/p cath 10/1. PMhx: dementia, CAD, HLD, HTN, CKD, prostate CA, Rt TKA   Clinical Impression   PTA pt living in local ILF, used cane for functional mobility, and was mostly independent for ADLs and received assist for IADLs from facility. At time of eval, pt able to complete transfers and mobility at supervision-min guard level with RW. Pt reports feeling less steady and more shaky, benefiting from use of RW for longer distances. He currently requires min A for lower body activities and toileting. Pt has a history of dementia and may require increased supervision upon returning to facility. Recommend after d/c pt continue with [facility] for continued ADL training. Will continue to follow per POC listed below.     Follow Up Recommendations  Home health OT;Supervision/Assistance - 24 hour (return to ALF)    Equipment Recommendations  None recommended by OT    Recommendations for Other Services       Precautions / Restrictions Precautions Precautions: Fall Restrictions Weight Bearing Restrictions: No      Mobility Bed Mobility               General bed mobility comments: up in chair, returned to chair  Transfers Overall transfer level: Needs assistance Equipment used: Rolling walker (2 wheeled) Transfers: Sit to/from Stand Sit to Stand: Supervision              Balance Overall balance assessment: Needs assistance   Sitting balance-Leahy Scale: Good     Standing balance support: Bilateral upper extremity supported;During functional activity Standing balance-Leahy Scale: Fair Standing balance comment: pt able to stand statically, unstable with mobility with benefit of RW for stability                           ADL either performed or assessed with clinical judgement   ADL  Overall ADL's : Needs assistance/impaired Eating/Feeding: Set up;Sitting   Grooming: Set up;Standing   Upper Body Bathing: Set up;Sitting   Lower Body Bathing: Minimal assistance;Sit to/from stand;Sitting/lateral leans   Upper Body Dressing : Set up;Sitting   Lower Body Dressing: Minimal assistance;Sit to/from stand;Sitting/lateral leans   Toilet Transfer: Min guard;Ambulation;Regular Toilet;RW;Grab bars   Toileting- Clothing Manipulation and Hygiene: Minimal assistance;Sitting/lateral lean;Sit to/from stand Toileting - Clothing Manipulation Details (indicate cue type and reason): for thoroughness     Functional mobility during ADLs: Min guard;Rolling walker;Cueing for safety       Vision Patient Visual Report: No change from baseline       Perception     Praxis      Pertinent Vitals/Pain Pain Assessment: No/denies pain     Hand Dominance     Extremity/Trunk Assessment Upper Extremity Assessment Upper Extremity Assessment: Overall WFL for tasks assessed   Lower Extremity Assessment Lower Extremity Assessment: Defer to PT evaluation       Communication Communication Communication: No difficulties   Cognition Arousal/Alertness: Awake/alert Behavior During Therapy: WFL for tasks assessed/performed Overall Cognitive Status: History of cognitive impairments - at baseline Area of Impairment: Memory;Safety/judgement                     Memory: Decreased short-term memory   Safety/Judgement: Decreased awareness of safety;Decreased awareness of deficits     General Comments: history of dementia at baseline. Requires increased cueing  and time for basic tasks. Poor recall noted   General Comments       Exercises     Shoulder Instructions      Home Living Family/patient expects to be discharged to:: Assisted living     Type of Home: Independent living facility Home Access: Level entry     Home Layout: One level     Bathroom Shower/Tub:  Teacher, early years/pre: Standard     Home Equipment: Cane - single point   Additional Comments: pt lives at [facility]. States he will be moving into higher level of care at facility for additional help upon d/c      Prior Functioning/Environment Level of Independence: Independent with assistive device(s)        Comments: uses a cane with tendency to lose it        OT Problem List: Decreased strength;Decreased knowledge of use of DME or AE;Decreased activity tolerance;Decreased cognition;Impaired balance (sitting and/or standing);Decreased safety awareness      OT Treatment/Interventions: Self-care/ADL training;Therapeutic exercise;Patient/family education;Balance training;Energy conservation;Therapeutic activities;DME and/or AE instruction;Cognitive remediation/compensation    OT Goals(Current goals can be found in the care plan section) Acute Rehab OT Goals Patient Stated Goal: return to my apartment and read OT Goal Formulation: With patient Time For Goal Achievement: 03/29/20 Potential to Achieve Goals: Good  OT Frequency: Min 2X/week   Barriers to D/C:            Co-evaluation              AM-PAC OT "6 Clicks" Daily Activity     Outcome Measure Help from another person eating meals?: None Help from another person taking care of personal grooming?: None Help from another person toileting, which includes using toliet, bedpan, or urinal?: A Little Help from another person bathing (including washing, rinsing, drying)?: A Little Help from another person to put on and taking off regular upper body clothing?: A Little Help from another person to put on and taking off regular lower body clothing?: A Little 6 Click Score: 20   End of Session Equipment Utilized During Treatment: Gait belt;Rolling walker Nurse Communication: Mobility status  Activity Tolerance: Patient tolerated treatment well Patient left: in chair;with call bell/phone within  reach  OT Visit Diagnosis: Unsteadiness on feet (R26.81);Other abnormalities of gait and mobility (R26.89);Muscle weakness (generalized) (M62.81);Other symptoms and signs involving cognitive function                Time: 7972-8206 OT Time Calculation (min): 17 min Charges:  OT General Charges $OT Visit: 1 Visit OT Evaluation $OT Eval Moderate Complexity: Leon, MSOT, OTR/L [place] [facility] Office Number: [phone] Pager: [phone]  Zenovia Jarred 03/15/2020, 6:06 PM

## 2020-03-15 NOTE — Plan of Care (Signed)

## 2020-03-15 NOTE — Progress Notes (Signed)
Progress Note  Patient Name: Ryan Humphrey Date of Encounter: 03/15/2020  Primary Cardiologist: Elouise Munroe, MD  Subjective   No chest pain or shortness of breath.  States that he slept well.  Inpatient Medications    Scheduled Meds: . aspirin EC  81 mg Oral Daily  . atorvastatin  80 mg Oral Daily  . cholecalciferol  2,000 Units Oral Daily  . clopidogrel  75 mg Oral Q breakfast  . gabapentin  400 mg Oral TID  . heparin  5,000 Units Subcutaneous Q8H  . metoprolol tartrate  25 mg Oral BID  . pantoprazole  40 mg Oral Daily  . sertraline  100 mg Oral Daily  . sodium chloride flush  3 mL Intravenous Q12H  . sodium chloride flush  3 mL Intravenous Q12H   Continuous Infusions: . sodium chloride     PRN Meds: sodium chloride, acetaminophen, nitroGLYCERIN, ondansetron (ZOFRAN) IV, sodium chloride flush   Vital Signs    Vitals:   03/14/20 2104 03/14/20 2345 03/15/20 0435 03/15/20 0735  BP:  (!) 101/57 (!) 105/51 (!) 97/48  Pulse: 75 72 66 64  Resp:  14 14 15   Temp:  99.1 F (37.3 C) 98 F (36.7 C) 97.6 F (36.4 C)  TempSrc:  Axillary Axillary Oral  SpO2:  95% 95% 94%  Weight:      Height:        Intake/Output Summary (Last 24 hours) at 03/15/2020 0819 Last data filed at 03/15/2020 0735 Gross per 24 hour  Intake 240 ml  Output 1000 ml  Net -760 ml   Filed Weights   03/12/20 1200 03/12/20 2145  Weight: 72 kg 70.4 kg    Telemetry    Sinus rhythm.  Personally reviewed.  ECG    I personally reviewed his ECG from 03/15/2020 which shows sinus rhythm with right bundle branch block.  Physical Exam   GEN:  Elderly male.  No acute distress.   Neck: No JVD. Cardiac:  RRR without gallop.  Respiratory: Nonlabored.  Clear to auscultation bilaterally. GI: Soft, nontender, bowel sounds present. MS:  No edema; No deformity. Neuro:  Nonfocal.  Labs    Chemistry Recent Labs  Lab 03/13/20 0439 03/14/20 0059 03/15/20 0102  NA 138 139 138  K 3.9 3.7 4.0    CL 106 109 107  CO2 25 21* 23  GLUCOSE 102* 96 113*  BUN 23 18 21   CREATININE 1.61* 1.52* 1.88*  CALCIUM 8.6* 8.2* 8.4*  GFRNONAA 38* 40* 31*  GFRAA 44* 47* 36*  ANIONGAP 7 9 8      Hematology Recent Labs  Lab 03/13/20 0045 03/14/20 0059 03/15/20 0102  WBC 6.5 8.3 7.5  RBC 3.46* 3.37* 3.14*  HGB 11.5* 11.0* 10.5*  HCT 33.7* 32.6* 30.5*  MCV 97.4 96.7 97.1  MCH 33.2 32.6 33.4  MCHC 34.1 33.7 34.4  RDW 12.7 12.8 12.9  PLT 112* 116* 103*    Cardiac Enzymes Recent Labs  Lab 03/12/20 0436 03/12/20 1520 03/12/20 1730 03/12/20 2317 03/13/20 0045  TROPONINIHS 310* 178* 166* 136* 130*    Radiology    CARDIAC CATHETERIZATION  Addendum Date: 03/13/2020   Conclusions: 1. Severe single-vessel coronary artery disease with multifocal proximal through distal LAD disease of up to 95%.  Mild, non-obstructive CAD involving LCx and RCA is also present. 2. Upper normal left ventricular filling pressure. 3. Successful IVUS-guided PCI to proximal and mid LAD using overlapping Resolute Onyx 3.0 x 12 mm (proximal) and 2.75 x 12  mm (distal) drug-eluting stents with 0% residual stenosis and TIMI-3 flow.  Recommendations: 1. Dual antiplatelet therapy with aspirin and clopidogrel for 12 months (if tolerated). 2. Aggressive secondary prevention. 3. Gentle post-catheterization hydration with continued monitoring of renal function. Nelva Bush, MD [facility]   Result Date: 03/13/2020 Conclusions: 1. Severe single-vessel coronary artery disease with multifocal proximal through distal LAD disease of up to 95%.  Mild, non-obstructive CAD involving LCx and RCA is also present. 2. Upper normal left ventricular filling pressure. 3. Successful IVUS-guided PCI to proximal and mid LAD using overlapping Resolute Onyx 3.0 x 12 mm (proximal) and 2.75 x 12 mm (distal) drug-eluting stents with 0% residual stenosis and TIMI-3 flow.  Recommendations: 1. Dual antiplatelet therapy with aspirin and clopidogrel  for 12 months (if tolerated). 2. Aggressive secondary prevention. 3. Gentle post-catheterization hydration with continued monitoring of renal function. Nelva Bush, MD [facility]    Patient Profile     86 y.o. male with a history of CAD, hyperlipidemia, hypertension, CKD stage III, prostate cancer, and dementia presenting with NSTEMI.  Assessment & Plan    1.  NSTEMI status post cardiac catheterization on October 1 demonstrating severe LAD disease managed with overlapping DES x2 to the proximal and mid LAD.  He reports no recurrent chest pain and has been hemodynamically stable.  2.  CKD stage IIIb, creatinine 1.88 this morning.  3.  Mixed hyperlipidemia, currently on Lipitor.  4.  Essential hypertension, systolics running 90 to 102.  He is asymptomatic.  5.  Mild aortic root dilatation at 41 mm with mild to moderate aortic regurgitation, asymptomatic.  6.  Dementia.  Anticipate discharge to assisted living facility first of the week with plan to continue medical therapy and observation.  Assessed for PT as well.  Signed, Rozann Lesches, MD  03/15/2020, 8:19 AM

## 2020-03-16 ENCOUNTER — Telehealth: Payer: Self-pay | Admitting: Internal Medicine

## 2020-03-16 ENCOUNTER — Encounter (HOSPITAL_COMMUNITY): Payer: Self-pay | Admitting: Internal Medicine

## 2020-03-16 ENCOUNTER — Other Ambulatory Visit: Payer: Self-pay | Admitting: Physician Assistant

## 2020-03-16 DIAGNOSIS — E782 Mixed hyperlipidemia: Secondary | ICD-10-CM | POA: Diagnosis not present

## 2020-03-16 DIAGNOSIS — W19XXXA Unspecified fall, initial encounter: Secondary | ICD-10-CM | POA: Diagnosis not present

## 2020-03-16 DIAGNOSIS — M6281 Muscle weakness (generalized): Secondary | ICD-10-CM | POA: Diagnosis not present

## 2020-03-16 DIAGNOSIS — R5381 Other malaise: Secondary | ICD-10-CM | POA: Diagnosis not present

## 2020-03-16 DIAGNOSIS — G301 Alzheimer's disease with late onset: Secondary | ICD-10-CM | POA: Diagnosis not present

## 2020-03-16 DIAGNOSIS — I25119 Atherosclerotic heart disease of native coronary artery with unspecified angina pectoris: Secondary | ICD-10-CM | POA: Diagnosis not present

## 2020-03-16 DIAGNOSIS — F339 Major depressive disorder, recurrent, unspecified: Secondary | ICD-10-CM | POA: Diagnosis not present

## 2020-03-16 DIAGNOSIS — D649 Anemia, unspecified: Secondary | ICD-10-CM | POA: Diagnosis not present

## 2020-03-16 DIAGNOSIS — G300 Alzheimer's disease with early onset: Secondary | ICD-10-CM

## 2020-03-16 DIAGNOSIS — I10 Essential (primary) hypertension: Secondary | ICD-10-CM | POA: Diagnosis not present

## 2020-03-16 DIAGNOSIS — I2510 Atherosclerotic heart disease of native coronary artery without angina pectoris: Secondary | ICD-10-CM | POA: Diagnosis not present

## 2020-03-16 DIAGNOSIS — Z955 Presence of coronary angioplasty implant and graft: Secondary | ICD-10-CM | POA: Diagnosis not present

## 2020-03-16 DIAGNOSIS — R4182 Altered mental status, unspecified: Secondary | ICD-10-CM | POA: Diagnosis not present

## 2020-03-16 DIAGNOSIS — G250 Essential tremor: Secondary | ICD-10-CM | POA: Diagnosis not present

## 2020-03-16 DIAGNOSIS — E785 Hyperlipidemia, unspecified: Secondary | ICD-10-CM | POA: Diagnosis not present

## 2020-03-16 DIAGNOSIS — N1832 Chronic kidney disease, stage 3b: Secondary | ICD-10-CM

## 2020-03-16 DIAGNOSIS — K219 Gastro-esophageal reflux disease without esophagitis: Secondary | ICD-10-CM | POA: Diagnosis not present

## 2020-03-16 DIAGNOSIS — R2681 Unsteadiness on feet: Secondary | ICD-10-CM | POA: Diagnosis not present

## 2020-03-16 DIAGNOSIS — M2550 Pain in unspecified joint: Secondary | ICD-10-CM | POA: Diagnosis not present

## 2020-03-16 DIAGNOSIS — G609 Hereditary and idiopathic neuropathy, unspecified: Secondary | ICD-10-CM | POA: Diagnosis not present

## 2020-03-16 DIAGNOSIS — Z7401 Bed confinement status: Secondary | ICD-10-CM | POA: Diagnosis not present

## 2020-03-16 DIAGNOSIS — R41841 Cognitive communication deficit: Secondary | ICD-10-CM | POA: Diagnosis not present

## 2020-03-16 DIAGNOSIS — F0280 Dementia in other diseases classified elsewhere without behavioral disturbance: Secondary | ICD-10-CM | POA: Diagnosis not present

## 2020-03-16 DIAGNOSIS — I214 Non-ST elevation (NSTEMI) myocardial infarction: Secondary | ICD-10-CM | POA: Diagnosis not present

## 2020-03-16 DIAGNOSIS — I499 Cardiac arrhythmia, unspecified: Secondary | ICD-10-CM | POA: Diagnosis not present

## 2020-03-16 DIAGNOSIS — R29898 Other symptoms and signs involving the musculoskeletal system: Secondary | ICD-10-CM | POA: Diagnosis not present

## 2020-03-16 DIAGNOSIS — N183 Chronic kidney disease, stage 3 unspecified: Secondary | ICD-10-CM | POA: Diagnosis not present

## 2020-03-16 LAB — BASIC METABOLIC PANEL
Anion gap: 7 (ref 5–15)
BUN: 24 mg/dL — ABNORMAL HIGH (ref 8–23)
CO2: 24 mmol/L (ref 22–32)
Calcium: 8.6 mg/dL — ABNORMAL LOW (ref 8.9–10.3)
Chloride: 108 mmol/L (ref 98–111)
Creatinine, Ser: 2.08 mg/dL — ABNORMAL HIGH (ref 0.61–1.24)
GFR calc Af Amer: 32 mL/min — ABNORMAL LOW (ref >60)
GFR calc non Af Amer: 28 mL/min — ABNORMAL LOW (ref >60)
Glucose, Bld: 106 mg/dL — ABNORMAL HIGH (ref 70–99)
Potassium: 4.5 mmol/L (ref 3.5–5.1)
Sodium: 139 mmol/L (ref 135–145)

## 2020-03-16 LAB — SARS CORONAVIRUS 2 BY RT PCR (HOSPITAL ORDER, PERFORMED IN ~~LOC~~ HOSPITAL LAB): SARS Coronavirus 2: NEGATIVE

## 2020-03-16 MED ORDER — ATORVASTATIN CALCIUM 80 MG PO TABS: 80.0000 mg | ORAL_TABLET | Freq: Every day | ORAL | 3 refills | Status: DC

## 2020-03-16 MED ORDER — METOPROLOL TARTRATE 25 MG PO TABS: 25.0000 mg | ORAL_TABLET | Freq: Two times a day (BID) | ORAL | 6 refills | Status: DC

## 2020-03-16 MED ORDER — CLOPIDOGREL BISULFATE 75 MG PO TABS: 75.0000 mg | ORAL_TABLET | Freq: Every day | ORAL | 3 refills | Status: DC

## 2020-03-16 MED ORDER — ASPIRIN EC 81 MG PO TBEC: 81.0000 mg | DELAYED_RELEASE_TABLET | ORAL | 3 refills | Status: DC | PRN

## 2020-03-16 MED ORDER — PANTOPRAZOLE SODIUM 40 MG PO TBEC: 40.0000 mg | DELAYED_RELEASE_TABLET | Freq: Every day | ORAL | 6 refills | Status: DC

## 2020-03-16 MED ORDER — NITROGLYCERIN 0.4 MG SL SUBL: 0.4000 mg | SUBLINGUAL_TABLET | SUBLINGUAL | 12 refills | Status: DC | PRN

## 2020-03-16 NOTE — NC FL2 (Signed)
[place] LEVEL OF CARE SCREENING TOOL     IDENTIFICATION  Patient Name: Ryan Humphrey Birthdate: 1928/12/25 Sex: male Admission Date (Current Location): 03/11/2020  [facility] and [place] Number:  Herbalist and Address:  [facility] [place], [place] [address] [place], [place] [address]      Provider Number: [id number]  Attending Physician Name and Address:  Geralynn Rile, MD  Relative Name and Phone Number:  [name] (daughter) [phone]    Current Level of Care: Hospital Recommended Level of Care: [place] Prior Approval Number:    Date Approved/Denied:   PASRR Number: [id number]  Discharge Plan: Other (Comment) (ALF)    Current Diagnoses: Patient Active Problem List   Diagnosis Date Noted  . NSTEMI (non-ST elevated myocardial infarction) ([place]) 03/12/2020  . Alzheimer's dementia without behavioral disturbance ([place]) 09/30/2019  . CKD (chronic kidney disease) stage 3, GFR 30-59 ml/min (HCC) 08/17/2017  . History of TIA (transient ischemic attack) 11/05/2016  . Vitamin B12 deficiency 11/05/2016  . GERD without esophagitis 11/05/2016  . Hereditary and idiopathic peripheral neuropathy 08/20/2015  . Benign essential tremor 08/20/2015  . Vitamin D deficiency 01/06/2015  . Medication management 01/06/2015  . Primary osteoarthritis of right knee 11/25/2014  . Malignant neoplasm of prostate ([place]) 03/28/2011  . INTERMITTENT VERTIGO 10/12/2009  . Mixed hyperlipidemia 04/10/2009  . Essential hypertension, benign 04/10/2009  . CORONARY ATHEROSCLEROSIS NATIVE CORONARY ARTERY 04/10/2009    Orientation RESPIRATION BLADDER Height & Weight     Self  Normal Continent Weight: 155 lb 3.3 oz (70.4 kg) Height:  5\' 10"  (177.8 cm)  BEHAVIORAL SYMPTOMS/MOOD NEUROLOGICAL BOWEL NUTRITION STATUS      Continent Diet (Heart Healthy Diet)  AMBULATORY STATUS COMMUNICATION OF NEEDS Skin   Limited Assist Verbally Normal                        Personal Care Assistance Level of Assistance  Bathing, Feeding, Dressing Bathing Assistance: Limited assistance Feeding assistance: Limited assistance Dressing Assistance: Limited assistance     Functional Limitations Info  Sight, Hearing, Speech Sight Info: Adequate Hearing Info: Adequate Speech Info: Adequate    SPECIAL CARE FACTORS FREQUENCY  OT (By licensed OT), PT (By licensed PT)     PT Frequency: 5x per week OT Frequency: 5x per week            Contractures Contractures Info: Not present    Additional Factors Info  Psychotropic Code Status Info: FULL Allergies Info: Niacin, Penicillins, Tramadol Psychotropic Info: gabapentin (NEURONTIN) capsule 400 mg 3x daily         Discharge Medications: TAKE these medications   aspirin EC 81 MG tablet Take 1 tablet (81 mg total) by mouth as needed for pain (headache). What changed:   medication strength  how much to take   atorvastatin 80 MG tablet Commonly known as: LIPITOR Take 1 tablet (80 mg total) by mouth daily. Start taking on: March 17, 2020   clopidogrel 75 MG tablet Commonly known as: PLAVIX Take 1 tablet (75 mg total) by mouth daily with breakfast. Start taking on: March 17, 2020   gabapentin 400 MG capsule Commonly known as: NEURONTIN Take 1 capsule 3 times a day for nerve pain. What changed:   how much to take  how to take this  when to take this  additional instructions   metoprolol tartrate 25 MG tablet Commonly known as: LOPRESSOR Take 1 tablet (25 mg total)  by mouth 2 (two) times daily.   nitroGLYCERIN 0.4 MG SL tablet Commonly known as: NITROSTAT Place 1 tablet (0.4 mg total) under the tongue every 5 (five) minutes as needed for chest pain.   pantoprazole 40 MG tablet Commonly known as: PROTONIX Take 1 tablet (40 mg total) by mouth daily. Start taking on: March 17, 2020 Replaces: omeprazole 40 MG capsule   sertraline 100 MG tablet Commonly  known as: ZOLOFT 1 tablet Daily for Mood What changed:   how much to take  how to take this  when to take this  additional instructions   Vitamin D 50 MCG (2000 UT) tablet Take 1 pill daily. What changed:   how much to take  how to take this  when to take this  additional instructions     Relevant Imaging Results:  Relevant Lab Results:   Additional Information SSN: [id number]  Loreta Ave, LCSWA

## 2020-03-16 NOTE — Discharge Summary (Signed)
Discharge Summary    Patient ID: Ryan Humphrey MRN: [id number]; DOB: 1929/05/21  Admit date: 03/11/2020 Discharge date: 03/16/2020  Primary Care Provider: Unk Pinto, MD  Primary Cardiologist: Elouise Munroe, MD   Discharge Diagnoses    Principal Problem:   NSTEMI (non-ST elevated myocardial infarction) [facility]) Active Problems:   Mixed hyperlipidemia   Essential hypertension, benign   Acute on CKD (chronic kidney disease) stage 3, GFR 30-59 ml/min (HCC)   Alzheimer's dementia without behavioral disturbance ([place])   Mild to moderate AI   Aortic root dilatations   Diagnostic Studies/Procedures    Echo 03/12/20 1. Difficult study due to poor visualization of the endocardial border  despite use of IV contrast agent. Based on limited views, no wall motion  abnormalities noted. LVEF appears to be normal at estimated 55-60%. Left  ventricular diastolic parameters are  indeterminate.  2. Right ventricular systolic function is normal. The right ventricular  size is normal.  3. The aortic valve is tricuspid. Aortic valve regurgitation is mild to  moderate.  4. Aortic dilatation noted. There is mild dilatation of the aortic root,  measuring 41 mm.  5. The inferior vena cava is dilated in size with >50% respiratory  variability, suggesting right atrial pressure of 8 mmHg.  6. The mitral valve is normal in structure. Trivial mitral valve  regurgitation.    History of Present Illness     Ryan Humphrey is a 86 y.o. male with hx of non obstructive CAD by cath in 2010, HLD, hypertension, dementia, CKD III and prostate cancer presented for chest pain evaluation and found to have elevated troponin.   Cath in 2010 showed 40% proximal LAD disease and 50% ostial diagonal disease. No significant disease in the RCA or Circumflex.  Seen by Dr. Harrington Challenger in 2016 for chest pain. Follow up stress test negative for inducible ischemia. EF was normal.  Seen by Dr. Margaretann Loveless 03/2018 for  chest pain. Order coronary CTA but patient never scheduled it or showed up for follow up.   Echo 09/2019 showed LVEF of 60-65%, grade I DD. 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 There is mild dilatation of the ascending aorta measuring 40 mm.   Presented with sudden onset substernal chest pressure radiating across his chest at 9 PM while laying.  Associated with shortness of breath.  No radiation to his jaw or arm.  Denied diaphoresis nausea or vomiting.  Symptoms lasted greater than 30 minutes and EMS was called. Hs-Troponin 67>>310. Stated on heparin and admitted.    Hospital Course     Consultants: None  1. NSTEMI - Peak of troponin was 310 then trended down. EKG with non specific changes. Treated with heparin. Echo showed LVEF of 55-60% and grade 2 DD. Hydrated overnight prior to cath. Cath showed severe single vessel LAD disease as showed in cath report s/p Successful IVUS-guided PCI to proximal and mid LAD using overlapping Resolute Onyx 3.0 x 12 mm (proximal) and 2.75 x 12 mm (distal) drug-eluting stents with 0% residual stenosis and TIMI-3 flow. 95 cc contrast during cath.  - No recurrent chest pain. Ambulated well.  - Continue DAPT with ASA and Plavix.  - Continue metoprolol and statin   2.Acute on  CKD IIIB - Baseline renal function 1.6-1.8 - Scr improved to 1.52 from 2.43 after hydration however after PCI (used 95cc of contrast) creatinine continue to rise 1.61>>1.52>>1.88>>2.08.  - Check renal function later this week - Avoid nephrotoxins - Encouraged hydration  3. Aortic regurgitation/mild dilated aortic root - Echo showed mild to moderate AI and 2mm mild dilated aortic root - Follow as outpatient   4. HTN - Propranolol changed to metoprolol 25mg  BID on admit - BP stable  5. HLD - 09/30/2019: VLDL 10 02/26/2020: Cholesterol 171; HDL 41; LDL Cholesterol (Calc) 104; Triglycerides 151  - Zocor changed to Lipitor this admission   6. Dementia -  At baseline (mild)  Did the patient have an acute coronary syndrome (MI, NSTEMI, STEMI, etc) this admission?:  Yes                               AHA/ACC Clinical Performance & Quality Measures: 1. Aspirin prescribed? - Yes 2. ADP Receptor Inhibitor (Plavix/Clopidogrel, Brilinta/Ticagrelor or Effient/Prasugrel) prescribed (includes medically managed patients)? - Yes 3. Beta Blocker prescribed? - Yes 4. High Intensity Statin (Lipitor 40-80mg  or Crestor 20-40mg ) prescribed? - Yes 5. EF assessed during THIS hospitalization? - Yes 6. For EF <40%, was ACEI/ARB prescribed? - Not Applicable (EF >/= 01%) 7. For EF <40%, Aldosterone Antagonist (Spironolactone or Eplerenone) prescribed? - Not Applicable (EF >/= 74%) 8. Cardiac Rehab Phase II ordered (including medically managed patients)? - Yes   _____________  Discharge Vitals Blood pressure (!) 93/58, pulse 60, temperature 97.8 F (36.6 C), temperature source Oral, resp. rate 17, height 5\' 10"  (1.778 m), weight 70.4 kg, SpO2 97 %.  Filed Weights   03/12/20 1200 03/12/20 2145  Weight: 72 kg 70.4 kg    Labs & Radiologic Studies    CBC Recent Labs    03/14/20 0059 03/15/20 0102  WBC 8.3 7.5  HGB 11.0* 10.5*  HCT 32.6* 30.5*  MCV 96.7 97.1  PLT 116* 944*   Basic Metabolic Panel Recent Labs    03/15/20 0102 03/16/20 0120  NA 138 139  K 4.0 4.5  CL 107 108  CO2 23 24  GLUCOSE 113* 106*  BUN 21 24*  CREATININE 1.88* 2.08*  CALCIUM 8.4* 8.6*   High Sensitivity Troponin:   Recent Labs  Lab 03/12/20 0436 03/12/20 1520 03/12/20 1730 03/12/20 2317 03/13/20 0045  TROPONINIHS 310* 178* 166* 136* 130*    _____________  DG Chest 2 View  Result Date: 03/11/2020 CLINICAL DATA:  Chest pain EXAM: CHEST - 2 VIEW COMPARISON:  09/30/2019 FINDINGS: The heart size and mediastinal contours are within normal limits. Both lungs are clear. The visualized skeletal structures are unremarkable. Aortic atherosclerosis. Degenerative changes of  the spine. IMPRESSION: No active cardiopulmonary disease. Electronically Signed   By: Donavan Foil M.D.   On: 03/11/2020 22:54   CARDIAC CATHETERIZATION  Addendum Date: 03/13/2020   Conclusions: 1. Severe single-vessel coronary artery disease with multifocal proximal through distal LAD disease of up to 95%.  Mild, non-obstructive CAD involving LCx and RCA is also present. 2. Upper normal left ventricular filling pressure. 3. Successful IVUS-guided PCI to proximal and mid LAD using overlapping Resolute Onyx 3.0 x 12 mm (proximal) and 2.75 x 12 mm (distal) drug-eluting stents with 0% residual stenosis and TIMI-3 flow.  Recommendations: 1. Dual antiplatelet therapy with aspirin and clopidogrel for 12 months (if tolerated). 2. Aggressive secondary prevention. 3. Gentle post-catheterization hydration with continued monitoring of renal function. Nelva Bush, MD [facility]   Result Date: 03/13/2020 Conclusions: 1. Severe single-vessel coronary artery disease with multifocal proximal through distal LAD disease of up to 95%.  Mild, non-obstructive CAD involving LCx and RCA is also present. 2. Upper  normal left ventricular filling pressure. 3. Successful IVUS-guided PCI to proximal and mid LAD using overlapping Resolute Onyx 3.0 x 12 mm (proximal) and 2.75 x 12 mm (distal) drug-eluting stents with 0% residual stenosis and TIMI-3 flow.  Recommendations: 1. Dual antiplatelet therapy with aspirin and clopidogrel for 12 months (if tolerated). 2. Aggressive secondary prevention. 3. Gentle post-catheterization hydration with continued monitoring of renal function. Nelva Bush, MD [facility]   ECHOCARDIOGRAM COMPLETE  Result Date: 03/12/2020    ECHOCARDIOGRAM REPORT   Patient Name:   Ryan Humphrey Date of Exam: 03/12/2020 Medical Rec #:  [id number]      Height:       70.0 in Accession #:    [id number]     Weight:       158.8 lb Date of Birth:  10/22/1928     BSA:          1.892 m Patient Age:    86 years        BP:           154/68 mmHg Patient Gender: M              HR:           64 bpm. Exam Location:  Inpatient Procedure: 2D Echo and Intracardiac Opacification Agent Indications:    NSTEMI I21.4  History:        Patient has prior history of Echocardiogram examinations, most                 recent 09/30/2019. CAD, TIA, Signs/Symptoms:Chest Pain and                 Vascular dementia; Risk Factors:Hypertension and Dyslipidemia.                 Chronic kidney disease,.  Sonographer:    Darlina Sicilian RDCS Referring Phys: [id number] [place]  1. Difficult study due to poor visualization of the endocardial border despite use of IV contrast agent. Based on limited views, no wall motion abnormalities noted. LVEF appears to be normal at estimated 55-60%. Left ventricular diastolic parameters are  indeterminate.  2. Right ventricular systolic function is normal. The right ventricular size is normal.  3. The aortic valve is tricuspid. Aortic valve regurgitation is mild to moderate.  4. Aortic dilatation noted. There is mild dilatation of the aortic root, measuring 41 mm.  5. The inferior vena cava is dilated in size with >50% respiratory variability, suggesting right atrial pressure of 8 mmHg.  6. The mitral valve is normal in structure. Trivial mitral valve regurgitation. Comparison(s): Compared to prior TTE on 09/30/19, there appears to be mild-to-moderate aortic regurgitation. FINDINGS  Left Ventricle: Difficult study due to poor visualization of the endocardial border despite use of IV contrast agent. Based on limited views, no wall motion abnormalities noted. LVEF appears to be normal at estimated 55-60%.. Definity contrast agent was  given IV to delineate the left ventricular endocardial borders. The left ventricular internal cavity size was normal in size. There is no left ventricular hypertrophy. Left ventricular diastolic parameters are indeterminate. Right Ventricle: The right ventricular size is  normal. Right vetricular wall thickness was not assessed. Right ventricular systolic function is normal. Left Atrium: Left atrial size was normal in size. Right Atrium: Right atrial size was not well visualized. Pericardium: There is no evidence of pericardial effusion. Mitral Valve: The mitral valve is normal in structure. Trivial mitral valve regurgitation. Tricuspid Valve: The tricuspid valve is normal  in structure. Tricuspid valve regurgitation is trivial. Aortic Valve: The aortic valve is tricuspid. Aortic valve regurgitation is mild to moderate. Pulmonic Valve: The pulmonic valve was normal in structure. Pulmonic valve regurgitation is not visualized. Aorta: Aortic dilatation noted. There is mild dilatation of the aortic root, measuring 41 mm. Venous: The inferior vena cava is dilated in size with greater than 50% respiratory variability, suggesting right atrial pressure of 8 mmHg. IAS/Shunts: No atrial level shunt detected by color flow Doppler.  LEFT VENTRICLE PLAX 2D LVOT diam:     1.90 cm  Diastology LV SV:         40       LV e' medial:    4.35 cm/s LV SV Index:   21       LV E/e' medial:  13.1 LVOT Area:     2.84 cm LV e' lateral:   6.15 cm/s                         LV E/e' lateral: 9.3  RIGHT VENTRICLE RV S prime:     3.60 cm/s LEFT ATRIUM             Index LA Vol (A2C):   43.3 ml 22.88 ml/m LA Vol (A4C):   44.8 ml 23.67 ml/m LA Biplane Vol: 45.0 ml 23.78 ml/m  AORTIC VALVE LVOT Vmax:   58.10 cm/s LVOT Vmean:  37.300 cm/s LVOT VTI:    0.141 m AI PHT:      521 msec  AORTA Ao Root diam: 4.10 cm MITRAL VALVE MV Area (PHT): 2.69 cm    SHUNTS MV Decel Time: 282 msec    Systemic VTI:  0.14 m MV E velocity: 57.00 cm/s  Systemic Diam: 1.90 cm MV A velocity: 79.70 cm/s MV E/A ratio:  0.72 Gwyndolyn Kaufman MD Electronically signed by Gwyndolyn Kaufman MD Signature Date/Time: 03/12/2020/6:14:48 PM    Final    Disposition   Pt is being discharged home today in good condition.  Follow-up Plans &  Appointments     Discharge Instructions    Amb Referral to Cardiac Rehabilitation   Complete by: As directed    Patient not appropriate at this time secondary to D/C to SNF. Will continue to follow patient   Diagnosis:  NSTEMI Coronary Stents     After initial evaluation and assessments completed: Virtual Based Care may be provided alone or in conjunction with Phase 2 Cardiac Rehab based on patient barriers.: Yes   Diet - low sodium heart healthy   Complete by: As directed    Discharge instructions   Complete by: As directed    No lifting over 5 lbs for 1 week. No sexual activity for 1 week. Keep procedure site clean & dry. If you notice increased pain, swelling, bleeding or pus, call/return!  You may shower, but no soaking baths/hot tubs/pools for 1 week.   Some studies suggest Prilosec/Omeprazole interacts with Plavix. We changed your Prilosec/Omeprazole to Protonix for less chance of interaction.   Increase activity slowly   Complete by: As directed       Discharge Medications   Allergies as of 03/16/2020      Reactions   Niacin Itching   Penicillins Other (See Comments)   "rash"   Tramadol Itching      Medication List    STOP taking these medications   omeprazole 40 MG capsule Commonly known as: PRILOSEC Replaced by: pantoprazole 40 MG tablet  propranolol 20 MG tablet Commonly known as: INDERAL   simvastatin 20 MG tablet Commonly known as: ZOCOR     TAKE these medications   aspirin EC 81 MG tablet Take 1 tablet (81 mg total) by mouth as needed for pain (headache). What changed:   medication strength  how much to take   atorvastatin 80 MG tablet Commonly known as: LIPITOR Take 1 tablet (80 mg total) by mouth daily. Start taking on: March 17, 2020   clopidogrel 75 MG tablet Commonly known as: PLAVIX Take 1 tablet (75 mg total) by mouth daily with breakfast. Start taking on: March 17, 2020   gabapentin 400 MG capsule Commonly known as:  NEURONTIN Take 1 capsule 3 times a day for nerve pain. What changed:   how much to take  how to take this  when to take this  additional instructions   metoprolol tartrate 25 MG tablet Commonly known as: LOPRESSOR Take 1 tablet (25 mg total) by mouth 2 (two) times daily.   nitroGLYCERIN 0.4 MG SL tablet Commonly known as: NITROSTAT Place 1 tablet (0.4 mg total) under the tongue every 5 (five) minutes as needed for chest pain.   pantoprazole 40 MG tablet Commonly known as: PROTONIX Take 1 tablet (40 mg total) by mouth daily. Start taking on: March 17, 2020 Replaces: omeprazole 40 MG capsule   sertraline 100 MG tablet Commonly known as: ZOLOFT 1 tablet Daily for Mood What changed:   how much to take  how to take this  when to take this  additional instructions   Vitamin D 50 MCG (2000 UT) tablet Take 1 pill daily. What changed:   how much to take  how to take this  when to take this  additional instructions          Outstanding Labs/Studies   BMET in few days  Duration of Discharge Encounter   Greater than 30 minutes including physician time.  SignedLeanor Kail, PA 03/16/2020, 11:02 AM

## 2020-03-16 NOTE — TOC Transition Note (Signed)
Transition of Care [facility]) - CM/SW Discharge Note   Patient Details  Name: Ryan Humphrey MRN: [id number] Date of Birth: 07-26-1928  Transition of Care [facility]) CM/SW Contact:  Loreta Ave, [place] Phone Number: 03/16/2020, 12:56 PM   Clinical Narrative:    Patient will DC to:  [facility] Anticipated DC date:  03/16/20 Family notified: Ginger Szczesny Transport by:  Corey Harold   Per MD patient ready for DC to [facility]. RN to call report prior to discharge [phone] ext [phone]. RN, patient, patient's family, and facility notified of DC. Discharge Summary and FL2 sent to facility. DC packet on chart. Ambulance transport requested for patient.   CSW will sign off for now as social work intervention is no longer needed. Please consult [name] again if new needs arise.     Final next level of care: Assisted Living Barriers to Discharge: Barriers Resolved   Patient Goals and CMS Choice Patient states their goals for this hospitalization and ongoing recovery are:: Get better soon CMS Medicare.gov Compare Post Acute Care list provided to:: Other (Comment Required) (Daughter Ginger) Choice offered to / list presented to : Adult Children  Discharge Placement              Patient chooses bed at: [facility] Patient to be transferred to facility by: [facility] Name of family member notified: Tyge Somers [phone] Patient and family notified of of transfer: 03/16/20  Discharge Plan and Services                                     Social Determinants of Health (SDOH) Interventions     Readmission Risk Interventions No flowsheet data found.

## 2020-03-16 NOTE — Progress Notes (Signed)
Pt discharged at 2153. BP 114/94, T 97.7 HR 66 . [facility] transported pt to [facility] facility.

## 2020-03-16 NOTE — Progress Notes (Signed)
Progress Note  Patient Name: Ryan Humphrey Date of Encounter: 03/16/2020  Primary Cardiologist: Elouise Munroe, MD  Subjective   No overnight events- awaiting ALF discharge. Creatinine has been rising over the weekend (1.52, 1.88, 2.08). Potassium rising as well to 4.5 today. Baseline CKD III - creatinine 1.6-1.8.   Inpatient Medications    Scheduled Meds: . aspirin EC  81 mg Oral Daily  . atorvastatin  80 mg Oral Daily  . cholecalciferol  2,000 Units Oral Daily  . clopidogrel  75 mg Oral Q breakfast  . gabapentin  400 mg Oral TID  . heparin  5,000 Units Subcutaneous Q8H  . metoprolol tartrate  25 mg Oral BID  . pantoprazole  40 mg Oral Daily  . sertraline  100 mg Oral Daily  . sodium chloride flush  3 mL Intravenous Q12H  . sodium chloride flush  3 mL Intravenous Q12H   Continuous Infusions: . sodium chloride     PRN Meds: sodium chloride, acetaminophen, nitroGLYCERIN, ondansetron (ZOFRAN) IV, sodium chloride flush   Vital Signs    Vitals:   03/15/20 1542 03/16/20 0019 03/16/20 0511 03/16/20 0754  BP: 100/61 (!) 104/47 93/66 (!) 111/50  Pulse: 70 67 60   Resp: 19 15 16 15   Temp: 97.8 F (36.6 C) (!) 97.4 F (36.3 C) 97.6 F (36.4 C) (!) 97.3 F (36.3 C)  TempSrc: Oral Oral Oral Oral  SpO2: 100% 98% 97%   Weight:      Height:        Intake/Output Summary (Last 24 hours) at 03/16/2020 0818 Last data filed at 03/16/2020 0728 Gross per 24 hour  Intake 720 ml  Output 1400 ml  Net -680 ml   Filed Weights   03/12/20 1200 03/12/20 2145  Weight: 72 kg 70.4 kg    Telemetry    Sinus rhythm.  Personally reviewed.  ECG    N/A  Physical Exam   GEN:  Elderly male.  No acute distress.   Neck: No JVD. Cardiac: RRR, no gallop.  Respiratory: Nonlabored. Clear to auscultation bilaterally. GI: Soft, nontender, bowel sounds present. MS: No edema; No deformity. Neuro:  Nonfocal.  Labs    Chemistry Recent Labs  Lab 03/14/20 0059 03/15/20 0102  03/16/20 0120  NA 139 138 139  K 3.7 4.0 4.5  CL 109 107 108  CO2 21* 23 24  GLUCOSE 96 113* 106*  BUN 18 21 24*  CREATININE 1.52* 1.88* 2.08*  CALCIUM 8.2* 8.4* 8.6*  GFRNONAA 40* 31* 28*  GFRAA 47* 36* 32*  ANIONGAP 9 8 7      Hematology Recent Labs  Lab 03/13/20 0045 03/14/20 0059 03/15/20 0102  WBC 6.5 8.3 7.5  RBC 3.46* 3.37* 3.14*  HGB 11.5* 11.0* 10.5*  HCT 33.7* 32.6* 30.5*  MCV 97.4 96.7 97.1  MCH 33.2 32.6 33.4  MCHC 34.1 33.7 34.4  RDW 12.7 12.8 12.9  PLT 112* 116* 103*    Cardiac Enzymes Recent Labs  Lab 03/12/20 0436 03/12/20 1520 03/12/20 1730 03/12/20 2317 03/13/20 0045  TROPONINIHS 310* 178* 166* 136* 130*    Radiology    No results found.  Patient Profile     86 y.o. male with a history of CAD, hyperlipidemia, hypertension, CKD stage III, prostate cancer, and dementia presenting with NSTEMI.  Assessment & Plan    1.  NSTEMI.  Clinically stable at this time without active chest pain and status post cardiac catheterization on October 1 demonstrating severe LAD disease managed with  overlapping DES x2 to the proximal and mid LAD.  2.  CKD stage IIIb, creatinine 1.52 down from 2.43, now rising - difficult to assess baseline, but appears to be between 1.6-1.8. Avoid nephrotoxins. Received 95 cc contrast during cath. Encourage hydration - monitor potassium.  3.  Mixed hyperlipidemia, currently on Lipitor.  4.  Essential hypertension - controlled  5.  Mild aortic root dilatation at 41 mm with mild to moderate aortic regurgitation, asymptomatic. Monitor with annual imaging.  6.  Dementia - appears mild.  [facility] - follow-up BMET later this week. Follow-up with Dr. Margaretann Loveless.  Pixie Casino, MD, [facility], [place] Director of the Advanced Lipid Disorders &  Cardiovascular Risk Reduction Clinic Diplomate of the American Board of Clinical Lipidology Attending Cardiologist  Direct Dial:  [phone]  Fax: [phone]  Website:  [email or web address]  Pixie Casino, MD  03/16/2020, 8:18 AM

## 2020-03-16 NOTE — Telephone Encounter (Signed)
Patient has TOC appointment with Dr. Margaretann Loveless on 03/30/2020 at 2:00 pm per [facility].

## 2020-03-16 NOTE — Consult Note (Signed)
   [facility] CM Inpatient Consult   03/16/2020  [place] 02/06/1929 [id number]   .[place] [employer] [ACO] Patient: Medicare NextGen   Patient screened for reassessment for potential [place] Care Management service needs.  Review of patient's medical record reveals patient is from [facility] and was a history of THN outreached by a [place] from an EMMI follow up.  Primary Care Provider is listed as  Unk Pinto, MD this provider office is listed to provide the transition of care [TOC] for post hospital follow up.   Plan:  No current needs noted as patient is to transition back to [facility].    Please place a [facility] Care Management consult as appropriate and for questions contact:   Natividad Brood, RN BSN [facility] Liaison  [phone] business mobile phone Toll free office [phone]  Fax number: [phone] [email or web address]

## 2020-03-16 NOTE — NC FL2 (Signed)
[place] LEVEL OF CARE SCREENING TOOL     IDENTIFICATION  Patient Name: Ryan Humphrey Birthdate: June 30, 1928 Sex: male Admission Date (Current Location): 03/11/2020  [facility] and [place] Number:  Herbalist and Address:  [facility]. [facility], [place] [address] [place], [place] [address]      Provider Number: [id number]  Attending Physician Name and Address:  Geralynn Rile, MD  Relative Name and Phone Number:  [name] (daughter) [phone]    Current Level of Care: Hospital Recommended Level of Care: [place] Prior Approval Number:    Date Approved/Denied:   PASRR Number: [id number]  Discharge Plan: Other (Comment) (ALF)    Current Diagnoses: Patient Active Problem List   Diagnosis Date Noted  . NSTEMI (non-ST elevated myocardial infarction) ([place]) 03/12/2020  . Alzheimer's dementia without behavioral disturbance ([place]) 09/30/2019  . CKD (chronic kidney disease) stage 3, GFR 30-59 ml/min (HCC) 08/17/2017  . History of TIA (transient ischemic attack) 11/05/2016  . Vitamin B12 deficiency 11/05/2016  . GERD without esophagitis 11/05/2016  . Hereditary and idiopathic peripheral neuropathy 08/20/2015  . Benign essential tremor 08/20/2015  . Vitamin D deficiency 01/06/2015  . Medication management 01/06/2015  . Primary osteoarthritis of right knee 11/25/2014  . Malignant neoplasm of prostate ([place]) 03/28/2011  . INTERMITTENT VERTIGO 10/12/2009  . Mixed hyperlipidemia 04/10/2009  . Essential hypertension, benign 04/10/2009  . CORONARY ATHEROSCLEROSIS NATIVE CORONARY ARTERY 04/10/2009    Orientation RESPIRATION BLADDER Height & Weight     Self  Normal Continent Weight: 155 lb 3.3 oz (70.4 kg) Height:  5\' 10"  (177.8 cm)  BEHAVIORAL SYMPTOMS/MOOD NEUROLOGICAL BOWEL NUTRITION STATUS      Continent Diet (Heart Healthy Diet)  AMBULATORY STATUS COMMUNICATION OF NEEDS Skin   Limited Assist Verbally Normal                        Personal Care Assistance Level of Assistance  Bathing, Feeding, Dressing Bathing Assistance: Limited assistance Feeding assistance: Limited assistance Dressing Assistance: Limited assistance     Functional Limitations Info  Sight, Hearing, Speech Sight Info: Adequate Hearing Info: Adequate Speech Info: Adequate    SPECIAL CARE FACTORS FREQUENCY  OT (By licensed OT), PT (By licensed PT)     PT Frequency: 5x per week OT Frequency: 5x per week            Contractures Contractures Info: Not present    Additional Factors Info  Psychotropic Code Status Info: FULL Allergies Info: Niacin, Penicillins, Tramadol Psychotropic Info: gabapentin (NEURONTIN) capsule 400 mg 3x daily         Current Medications (03/16/2020):  This is the current hospital active medication list Current Facility-Administered Medications  Medication Dose Route Frequency Provider Last Rate Last Admin  . 0.9 %  sodium chloride infusion  250 mL Intravenous PRN End, Harrell Gave, MD      . acetaminophen (TYLENOL) tablet 650 mg  650 mg Oral Q4H PRN End, Harrell Gave, MD      . aspirin EC tablet 81 mg  81 mg Oral Daily End, Christopher, MD   81 mg at 03/16/20 0950  . atorvastatin (LIPITOR) tablet 80 mg  80 mg Oral Daily End, Christopher, MD   80 mg at 03/16/20 0950  . cholecalciferol (VITAMIN D3) tablet 2,000 Units  2,000 Units Oral Daily End, Christopher, MD   2,000 Units at 03/16/20 0950  . clopidogrel (PLAVIX) tablet 75 mg  75 mg Oral Q breakfast End,  Harrell Gave, MD   75 mg at 03/16/20 0949  . gabapentin (NEURONTIN) capsule 400 mg  400 mg Oral TID End, Christopher, MD   400 mg at 03/16/20 0950  . heparin injection 5,000 Units  5,000 Units Subcutaneous Q8H End, Christopher, MD   5,000 Units at 03/16/20 0545  . metoprolol tartrate (LOPRESSOR) tablet 25 mg  25 mg Oral BID End, Christopher, MD   25 mg at 03/16/20 0950  . nitroGLYCERIN (NITROSTAT) SL tablet 0.4 mg  0.4 mg Sublingual Q5 min PRN  End, Harrell Gave, MD      . ondansetron (ZOFRAN) injection 4 mg  4 mg Intravenous Q6H PRN End, Harrell Gave, MD   4 mg at 03/13/20 1016  . pantoprazole (PROTONIX) EC tablet 40 mg  40 mg Oral Daily End, Christopher, MD   40 mg at 03/16/20 0949  . sertraline (ZOLOFT) tablet 100 mg  100 mg Oral Daily End, Christopher, MD   100 mg at 03/16/20 0949  . sodium chloride flush (NS) 0.9 % injection 3 mL  3 mL Intravenous Q12H End, Christopher, MD   3 mL at 03/15/20 2203  . sodium chloride flush (NS) 0.9 % injection 3 mL  3 mL Intravenous Q12H End, Christopher, MD   3 mL at 03/15/20 2202  . sodium chloride flush (NS) 0.9 % injection 3 mL  3 mL Intravenous PRN End, Harrell Gave, MD         Discharge Medications: Please see discharge summary for a list of discharge medications.  Relevant Imaging Results:  Relevant Lab Results:   Additional Information SSN: [id number]  Loreta Ave, LCSWA

## 2020-03-16 NOTE — Plan of Care (Signed)

## 2020-03-17 ENCOUNTER — Non-Acute Institutional Stay (SKILLED_NURSING_FACILITY): Payer: Medicare Other | Admitting: Internal Medicine

## 2020-03-17 ENCOUNTER — Encounter: Payer: Self-pay | Admitting: Internal Medicine

## 2020-03-17 DIAGNOSIS — F339 Major depressive disorder, recurrent, unspecified: Secondary | ICD-10-CM

## 2020-03-17 DIAGNOSIS — G250 Essential tremor: Secondary | ICD-10-CM | POA: Diagnosis not present

## 2020-03-17 DIAGNOSIS — G300 Alzheimer's disease with early onset: Secondary | ICD-10-CM | POA: Diagnosis not present

## 2020-03-17 DIAGNOSIS — I214 Non-ST elevation (NSTEMI) myocardial infarction: Secondary | ICD-10-CM

## 2020-03-17 DIAGNOSIS — E785 Hyperlipidemia, unspecified: Secondary | ICD-10-CM

## 2020-03-17 DIAGNOSIS — G609 Hereditary and idiopathic neuropathy, unspecified: Secondary | ICD-10-CM

## 2020-03-17 DIAGNOSIS — N1832 Chronic kidney disease, stage 3b: Secondary | ICD-10-CM | POA: Diagnosis not present

## 2020-03-17 DIAGNOSIS — F0280 Dementia in other diseases classified elsewhere without behavioral disturbance: Secondary | ICD-10-CM

## 2020-03-17 NOTE — Care Management Important Message (Signed)
Important Message  Patient Details  Name: Ryan Humphrey MRN: [id number] Date of Birth: Sep 18, 1928   Medicare Important Message Given:  Yes  IM Document  delivered on 03/17/2020    Dalayah Deahl 03/17/2020, 10:50 AM

## 2020-03-17 NOTE — Progress Notes (Signed)
Provider:  Veleta Miners MD Location:   [place] Room Number: 34 Place of Service:  SNF (31)  PCP: Ryan Pinto, MD Patient Care Team: Ryan Pinto, MD as PCP - General (Internal Medicine) Ryan Munroe, MD as PCP - Cardiology (Cardiology)  Extended Emergency Contact Information Primary Emergency Contact: Ryan Humphrey Address: [address]          [place], [place] [address] Ryan Humphrey of [place] Phone: [phone] Mobile Phone: [phone] Relation: Daughter Secondary Emergency Contact: Ryan Humphrey, [place] of [place] Mobile Phone: [phone] Relation: Daughter  Code Status: Full Code Goals of Care: Advanced Directive information Advanced Directives 03/12/2020  Does Patient Have a Medical Advance Directive? No  Type of Advance Directive -  Does patient want to make changes to medical advance directive? No - Guardian declined  Copy of [place] in Chart? No - copy requested  Would patient like information on creating a medical advance directive? -      Chief Complaint  Patient presents with  . New Admit To SNF    Admission to SNF    HPI: Patient is a 86 y.o. male seen today for admission to SNF for therapy  Admitted from 9/29-10/4 for NSTEMI, and Renal Insufficiency,   Has h/o Hypertension,HLD, h/o CAD, Moderate AI, Chronic CKD stage 3, Alzheimer's Dementia   Went to ED for Substernal Chest pain on rest In ED had Troponin's peaked at 300. EKG RBBB.  Taken for Cath which showed LAD stenosis underwent with Drug Eluting Stent LVEF was 55-60% with no Wall motion abnormalities but Moderate AI Statin changed to Lipitor Worsening of Renal Fucntion of 1.8 to 2.4 Some better with Hydration  Feeling Better here. Does not have any more chest pains Does remember being in Hospital but does not remember anything else Per patient he lives by himself in his apartment and Can walk  with no assist  Past Medical History:  Diagnosis Date  . Anemia   . Arthritis    arthritis,osteopenia,"spinal stenosis"  . Bladder neck obstruction 03/28/2011  . CAD (coronary artery disease)   . Cancer [facility]) 12-06-12   Prostate cancer'98  . CKD (chronic kidney disease) stage 3, GFR 30-59 ml/min (HCC) 08/17/2017  . Depression   . Essential hypertension, benign 04/10/2009  . GERD (gastroesophageal reflux disease)   . GERD without esophagitis 11/05/2016  . H/O hiatal hernia   . H/O vertigo 12-06-12   none recent  . Hyperlipidemia   . IBS (irritable bowel syndrome)   . Mixed hyperlipidemia 04/10/2009  . Neuropathy   . Osteopenia   . Peripheral neuropathy 12-06-12   peripheral neuropathy  . Rhinitis   . RLS (restless legs syndrome)   . Vascular dementia ([place]) 09/30/2019  . Vitamin B12 deficiency    Past Surgical History:  Procedure Laterality Date  . APPENDECTOMY    . CARPAL TUNNEL RELEASE     both hands  . cataract surgery Left 12-06-12   recent surgery  . CHOLECYSTECTOMY    . COLONOSCOPY WITH PROPOFOL N/A 12/25/2012   Procedure: COLONOSCOPY WITH PROPOFOL;  Surgeon: Garlan Fair, MD;  Location: [facility] ENDOSCOPY;  Service: Endoscopy;  Laterality: N/A;  . CORONARY STENT INTERVENTION N/A 03/13/2020   Procedure: CORONARY STENT INTERVENTION;  Surgeon: Nelva Bush, MD;  Location: [place] CV LAB;  Service: Cardiovascular;  Laterality: N/A;  . ESOPHAGOGASTRODUODENOSCOPY (EGD) WITH PROPOFOL N/A 12/25/2012   Procedure:  ESOPHAGOGASTRODUODENOSCOPY (EGD) WITH PROPOFOL;  Surgeon: Garlan Fair, MD;  Location: [facility];  Service: Endoscopy;  Laterality: N/A;  . INTRAVASCULAR ULTRASOUND/IVUS N/A 03/13/2020   Procedure: Intravascular Ultrasound/IVUS;  Surgeon: Nelva Bush, MD;  Location: [place] CV LAB;  Service: Cardiovascular;  Laterality: N/A;  . LEFT HEART CATH AND CORONARY ANGIOGRAPHY N/A 03/13/2020   Procedure: LEFT HEART CATH AND CORONARY ANGIOGRAPHY;  Surgeon: Nelva Bush, MD;  Location: [place] CV LAB;  Service: Cardiovascular;  Laterality: N/A;  . PROSTATE SURGERY  12-06-12  . TONSILLECTOMY    . TOTAL KNEE ARTHROPLASTY Right 11/25/2014   Procedure: RIGHT TOTAL KNEE ARTHROPLASTY;  Surgeon: Melrose Nakayama, MD;  Location: [facility];  Service: Orthopedics;  Laterality: Right;    reports that he quit smoking about 56 years ago. He has a 15.00 pack-year smoking history. He has never used smokeless tobacco. He reports current alcohol use. He reports that he does not use drugs. Social History   Socioeconomic History  . Marital status: Widowed    Spouse name: Not on file  . Number of children: 2  . Years of education: College  . Highest education level: Not on file  Occupational History  . Occupation: Retired  Tobacco Use  . Smoking status: Former Smoker    Packs/day: 0.50    Years: 30.00    Pack years: 15.00    Quit date: 06/14/1963    Years since quitting: 56.7  . Smokeless tobacco: Never Used  Vaping Use  . Vaping Use: Never used  Substance and Sexual Activity  . Alcohol use: Yes    Alcohol/week: 0.0 standard drinks    Comment: Socially  . Drug use: No  . Sexual activity: Not Currently  Other Topics Concern  . Not on file  Social History Narrative   Pt lives at home alone, widowed in 2011, married for 55 years. They have two grown daugthers.   Plays the piano.   Caffeine Use: Rarely   Social Determinants of Health   Financial Resource Strain:   . Difficulty of Paying Living Expenses: Not on file  Food Insecurity:   . Worried About Charity fundraiser in the Last Year: Not on file  . Ran Out of Food in the Last Year: Not on file  Transportation Needs:   . Lack of Transportation (Medical): Not on file  . Lack of Transportation (Non-Medical): Not on file  Physical Activity:   . Days of Exercise per Week: Not on file  . Minutes of Exercise per Session: Not on file  Stress:   . Feeling of Stress : Not on file  Social Connections:    . Frequency of Communication with Friends and Family: Not on file  . Frequency of Social Gatherings with Friends and Family: Not on file  . Attends Religious Services: Not on file  . Active Member of Clubs or Organizations: Not on file  . Attends Archivist Meetings: Not on file  . Marital Status: Not on file  Intimate Partner Violence:   . Fear of Current or Ex-Partner: Not on file  . Emotionally Abused: Not on file  . Physically Abused: Not on file  . Sexually Abused: Not on file    Functional Status Survey:    Family History  Problem Relation Age of Onset  . CAD Father        Deceased, 85  . Dementia Mother        Deceased, 18  . Diabetes Brother   .  Hyperlipidemia Brother   . Hypertension Brother   . Dementia Brother   . Colon cancer Neg Hx   . Colon polyps Neg Hx   . Kidney disease Neg Hx   . Esophageal cancer Neg Hx   . Gallbladder disease Neg Hx     Health Maintenance  Topic Date Due  . COVID-19 Vaccine (1) Never done  . TETANUS/TDAP  Never done  . INFLUENZA VACCINE  01/12/2020  . PNA vac Low Risk Adult  Completed    Allergies  Allergen Reactions  . Niacin Itching  . Penicillins Other (See Comments)    "rash"   . Tramadol Itching    Allergies as of 03/17/2020      Reactions   Niacin Itching   Penicillins Other (See Comments)   "rash"   Tramadol Itching      Medication List       Accurate as of March 17, 2020  9:25 AM. If you have any questions, ask your nurse or doctor.        aspirin EC 81 MG tablet Take 1 tablet (81 mg total) by mouth as needed for pain (headache).   atorvastatin 80 MG tablet Commonly known as: LIPITOR Take 1 tablet (80 mg total) by mouth daily.   clopidogrel 75 MG tablet Commonly known as: PLAVIX Take 1 tablet (75 mg total) by mouth daily with breakfast.   gabapentin 400 MG capsule Commonly known as: NEURONTIN Take 1 capsule 3 times a day for nerve pain. What changed:   how much to take  how to  take this  when to take this  additional instructions   metoprolol tartrate 25 MG tablet Commonly known as: LOPRESSOR Take 1 tablet (25 mg total) by mouth 2 (two) times daily.   nitroGLYCERIN 0.4 MG SL tablet Commonly known as: NITROSTAT Place 1 tablet (0.4 mg total) under the tongue every 5 (five) minutes as needed for chest pain.   pantoprazole 40 MG tablet Commonly known as: PROTONIX Take 1 tablet (40 mg total) by mouth daily.   sertraline 100 MG tablet Commonly known as: ZOLOFT 1 tablet Daily for Mood What changed:   how much to take  how to take this  when to take this  additional instructions   Vitamin D 50 MCG (2000 UT) tablet Take 1 pill daily. What changed:   how much to take  how to take this  when to take this  additional instructions       Review of Systems  Constitutional: Negative.   HENT: Negative.   Respiratory: Negative.   Cardiovascular: Negative.   Gastrointestinal: Negative.   Genitourinary: Negative.   Musculoskeletal: Positive for gait problem.  Skin: Negative.   Psychiatric/Behavioral: Positive for confusion.    Vitals:   03/17/20 0921  BP: 120/70  Pulse: 76  Resp: 18  Temp: (!) 97.4 F (36.3 C)  SpO2: 95%  Weight: 155 lb 12.8 oz (70.7 kg)  Height: 6' (1.829 m)   Body mass index is 21.13 kg/m. Physical Exam  Constitutional:  Well-developed and well-nourished.  HENT:  Head: Normocephalic.  Mouth/Throat: Oropharynx is clear and moist.  Eyes: Pupils are equal, round, and reactive to light.  Neck: Neck supple.  Cardiovascular: Normal rate and normal heart sounds.  Positive for  murmur heard. Pulmonary/Chest: Effort normal and breath sounds normal. No respiratory distress. No wheezes. She has no rales.  Abdominal: Soft. Bowel sounds are normal. No distension. There is no tenderness. There is no rebound.  Musculoskeletal:  No edema.  Lymphadenopathy: none Neurological:No Focal Deficits Skin: Skin is warm and dry.    Psychiatric: Normal mood and affect. Behavior is normal. Thought content normal.    Labs reviewed: Basic Metabolic Panel: Recent Labs    09/10/19 1524 09/30/19 0209 02/26/20 1620 03/11/20 2307 03/14/20 0059 03/15/20 0102 03/16/20 0120  NA 142   < > 143   < > 139 138 139  K 4.3   < > 5.0   < > 3.7 4.0 4.5  CL 105   < > 106   < > 109 107 108  CO2 30   < > 30   < > 21* 23 24  GLUCOSE 92   < > 113*   < > 96 113* 106*  BUN 26*   < > 29*   < > 18 21 24*  CREATININE 1.94*   < > 1.84*   < > 1.52* 1.88* 2.08*  CALCIUM 9.0   < > 9.6   < > 8.2* 8.4* 8.6*  MG 2.1  --  2.3  --   --   --   --    < > = values in this interval not displayed.   Liver Function Tests: Recent Labs    09/30/19 0209 11/12/19 1536 02/26/20 1620  AST 15 11 13   ALT 11 7* 8*  ALKPHOS 53  --   --   BILITOT 0.3 0.5 0.6  PROT 5.7* 6.2 6.6  ALBUMIN 3.4*  --   --    Recent Labs    09/30/19 0209  LIPASE 40   No results for input(s): AMMONIA in the last 8760 hours. CBC: Recent Labs    09/10/19 1524 09/30/19 0209 11/12/19 1536 11/12/19 1536 02/26/20 1620 03/11/20 2307 03/13/20 0045 03/14/20 0059 03/15/20 0102  WBC 7.5   < > 6.6   < > 8.4   < > 6.5 8.3 7.5  NEUTROABS 5,775  --  5,029  --  6,275  --   --   --   --   HGB 12.0*   < > 11.4*   < > 13.0*   < > 11.5* 11.0* 10.5*  HCT 35.9*   < > 33.6*   < > 38.4*   < > 33.7* 32.6* 30.5*  MCV 96.2   < > 98.0   < > 97.0   < > 97.4 96.7 97.1  PLT 129*   < > 123*   < > 145   < > 112* 116* 103*   < > = values in this interval not displayed.   Cardiac Enzymes: No results for input(s): CKTOTAL, CKMB, CKMBINDEX, TROPONINI in the last 8760 hours. BNP: Invalid input(s): POCBNP Lab Results  Component Value Date   HGBA1C 5.1 09/10/2019   Lab Results  Component Value Date   TSH 3.715 03/12/2020   Lab Results  Component Value Date   VITAMINB12 >2,000 (H) 09/10/2019   No results found for: FOLATE No results found for: IRON, TIBC, FERRITIN  Imaging and  Procedures obtained prior to SNF admission: DG Chest 2 View  Result Date: 03/11/2020 CLINICAL DATA:  Chest pain EXAM: CHEST - 2 VIEW COMPARISON:  09/30/2019 FINDINGS: The heart size and mediastinal contours are within normal limits. Both lungs are clear. The visualized skeletal structures are unremarkable. Aortic atherosclerosis. Degenerative changes of the spine. IMPRESSION: No active cardiopulmonary disease. Electronically Signed   By: Donavan Foil M.D.   On: 03/11/2020 22:54   ECHOCARDIOGRAM COMPLETE  Result Date: 03/12/2020  ECHOCARDIOGRAM REPORT   Patient Name:   Ryan Humphrey Date of Exam: 03/12/2020 Medical Rec #:  [id number]      Height:       70.0 in Accession #:    [id number]     Weight:       158.8 lb Date of Birth:  [birth year]     BSA:          1.892 m Patient Age:    28 years       BP:           154/68 mmHg Patient Gender: M              HR:           64 bpm. Exam Location:  Inpatient Procedure: 2D Echo and Intracardiac Opacification Agent Indications:    NSTEMI I21.4  History:        Patient has prior history of Echocardiogram examinations, most                 recent 09/30/2019. CAD, TIA, Signs/Symptoms:Chest Pain and                 Vascular dementia; Risk Factors:Hypertension and Dyslipidemia.                 Chronic kidney disease,.  Sonographer:    Darlina Sicilian RDCS Referring Phys: [id number] [place]  1. Difficult study due to poor visualization of the endocardial border despite use of IV contrast agent. Based on limited views, no wall motion abnormalities noted. LVEF appears to be normal at estimated 55-60%. Left ventricular diastolic parameters are  indeterminate.  2. Right ventricular systolic function is normal. The right ventricular size is normal.  3. The aortic valve is tricuspid. Aortic valve regurgitation is mild to moderate.  4. Aortic dilatation noted. There is mild dilatation of the aortic root, measuring 41 mm.  5. The inferior vena cava is dilated in  size with >50% respiratory variability, suggesting right atrial pressure of 8 mmHg.  6. The mitral valve is normal in structure. Trivial mitral valve regurgitation. Comparison(s): Compared to prior TTE on 09/30/19, there appears to be mild-to-moderate aortic regurgitation. FINDINGS  Left Ventricle: Difficult study due to poor visualization of the endocardial border despite use of IV contrast agent. Based on limited views, no wall motion abnormalities noted. LVEF appears to be normal at estimated 55-60%.. Definity contrast agent was  given IV to delineate the left ventricular endocardial borders. The left ventricular internal cavity size was normal in size. There is no left ventricular hypertrophy. Left ventricular diastolic parameters are indeterminate. Right Ventricle: The right ventricular size is normal. Right vetricular wall thickness was not assessed. Right ventricular systolic function is normal. Left Atrium: Left atrial size was normal in size. Right Atrium: Right atrial size was not well visualized. Pericardium: There is no evidence of pericardial effusion. Mitral Valve: The mitral valve is normal in structure. Trivial mitral valve regurgitation. Tricuspid Valve: The tricuspid valve is normal in structure. Tricuspid valve regurgitation is trivial. Aortic Valve: The aortic valve is tricuspid. Aortic valve regurgitation is mild to moderate. Pulmonic Valve: The pulmonic valve was normal in structure. Pulmonic valve regurgitation is not visualized. Aorta: Aortic dilatation noted. There is mild dilatation of the aortic root, measuring 41 mm. Venous: The inferior vena cava is dilated in size with greater than 50% respiratory variability, suggesting right atrial pressure of 8 mmHg. IAS/Shunts: No atrial level shunt detected by color flow Doppler.  LEFT VENTRICLE PLAX 2D LVOT diam:     1.90 cm  Diastology LV SV:         40       LV e' medial:    4.35 cm/s LV SV Index:   21       LV E/e' medial:  13.1 LVOT Area:      2.84 cm LV e' lateral:   6.15 cm/s                         LV E/e' lateral: 9.3  RIGHT VENTRICLE RV S prime:     3.60 cm/s LEFT ATRIUM             Index LA Vol (A2C):   43.3 ml 22.88 ml/m LA Vol (A4C):   44.8 ml 23.67 ml/m LA Biplane Vol: 45.0 ml 23.78 ml/m  AORTIC VALVE LVOT Vmax:   58.10 cm/s LVOT Vmean:  37.300 cm/s LVOT VTI:    0.141 m AI PHT:      521 msec  AORTA Ao Root diam: 4.10 cm MITRAL VALVE MV Area (PHT): 2.69 cm    SHUNTS MV Decel Time: 282 msec    Systemic VTI:  0.14 m MV E velocity: 57.00 cm/s  Systemic Diam: 1.90 cm MV A velocity: 79.70 cm/s MV E/A ratio:  0.72 Gwyndolyn Kaufman MD Electronically signed by Gwyndolyn Kaufman MD Signature Date/Time: 03/12/2020/6:14:48 PM    Final     Assessment/Plan NSTEMI (non-ST elevated myocardial infarction) (HCC) Continue on Aspirin and Plavix DPAT for now Also started on Beta Blocker and statin Follow with Cardiology Stage 3b chronic kidney disease ([place]) With Worsenign after Cath Repeat BMP  Early onset Alzheimer's dementia without behavioral disturbance ([place]) Not tolerated any meds Sees Neurologist Last MMSE in 2019 was 23  Benign essential tremor Was on Propanol changed to Metoprolol in hospital Hereditary and idiopathic peripheral neuropathy On High Dose of neurontin per Neurology Depression, recurrent ([place]) On Zolft Hyperlipidemia, unspecified hyperlipidemia type Started oN Lipitor from Zocor     Family/ staff Communication:   Labs/tests ordered: CBC,CMP

## 2020-03-17 NOTE — Telephone Encounter (Signed)
Left messageon voicemail to call back for [facility] call questions First attempt.

## 2020-03-18 ENCOUNTER — Encounter: Payer: Self-pay | Admitting: Nurse Practitioner

## 2020-03-18 ENCOUNTER — Non-Acute Institutional Stay (SKILLED_NURSING_FACILITY): Payer: Medicare Other | Admitting: Nurse Practitioner

## 2020-03-18 DIAGNOSIS — W19XXXA Unspecified fall, initial encounter: Secondary | ICD-10-CM

## 2020-03-18 DIAGNOSIS — K219 Gastro-esophageal reflux disease without esophagitis: Secondary | ICD-10-CM | POA: Diagnosis not present

## 2020-03-18 DIAGNOSIS — I10 Essential (primary) hypertension: Secondary | ICD-10-CM | POA: Diagnosis not present

## 2020-03-18 DIAGNOSIS — G300 Alzheimer's disease with early onset: Secondary | ICD-10-CM | POA: Diagnosis not present

## 2020-03-18 DIAGNOSIS — F339 Major depressive disorder, recurrent, unspecified: Secondary | ICD-10-CM | POA: Diagnosis not present

## 2020-03-18 DIAGNOSIS — R296 Repeated falls: Secondary | ICD-10-CM | POA: Insufficient documentation

## 2020-03-18 DIAGNOSIS — N1832 Chronic kidney disease, stage 3b: Secondary | ICD-10-CM | POA: Diagnosis not present

## 2020-03-18 DIAGNOSIS — F0280 Dementia in other diseases classified elsewhere without behavioral disturbance: Secondary | ICD-10-CM | POA: Diagnosis not present

## 2020-03-18 DIAGNOSIS — G609 Hereditary and idiopathic neuropathy, unspecified: Secondary | ICD-10-CM | POA: Diagnosis not present

## 2020-03-18 DIAGNOSIS — I2510 Atherosclerotic heart disease of native coronary artery without angina pectoris: Secondary | ICD-10-CM | POA: Diagnosis not present

## 2020-03-18 NOTE — Assessment & Plan Note (Addendum)
Controlled blood pressure, continue Metoprolol. Will hold Metoprolol if Bp<90/26mmHg

## 2020-03-18 NOTE — Assessment & Plan Note (Signed)
F/u neurology, continue Gabapentin

## 2020-03-18 NOTE — Assessment & Plan Note (Signed)
New admit to SNF [facility] 03/16/20 for therapy following hospital stay 03/11/20-03/16/20 for NSTEMI, troponin 300s at ED, Cath showed LAD stenosis, underwent drug eluting stent, EF 55-60%, takes Atorvastatin, ASA, Plavix, Metoprolol.

## 2020-03-18 NOTE — Assessment & Plan Note (Addendum)
Close supervision for safety, didn't tolerate memory preserving meds. The patient has no recollection of the fall event, but he did remember his recent hospitalization, asked me what stents are, remembered his brothers.

## 2020-03-18 NOTE — Telephone Encounter (Signed)
Lm to call back ./cy 

## 2020-03-18 NOTE — Assessment & Plan Note (Signed)
Stable, continue Sertraline.  

## 2020-03-18 NOTE — Assessment & Plan Note (Signed)
Hydrated in hospital, pending f/u BMP

## 2020-03-18 NOTE — Assessment & Plan Note (Signed)
for reported fall, the patient was found sitting on the floor when trying to help another resident with their wheelchair, no apparent injury. Mechanical fall, emphasizes safety awareness.

## 2020-03-18 NOTE — Assessment & Plan Note (Signed)
Stable, continue Pantoprazole.  

## 2020-03-18 NOTE — Progress Notes (Addendum)
Location:   [facility] Room Number: 65 Place of Service:  SNF (31) Provider: Lennie Odor Graham Hyun NP  Unk Pinto, MD  Patient Care Team: Unk Pinto, MD as PCP - General (Internal Medicine) Ryan Munroe, MD as PCP - Cardiology (Cardiology)  Extended Emergency Contact Information Primary Emergency Contact: Humphrey,Ryan Address: [address]          [place], [place] [address] Ryan Humphrey [place] Phone: [phone] Mobile Phone: [phone] Relation: Daughter Secondary Emergency Contact: Ryan Humphrey, [place] Phone: [phone] Relation: Daughter  Code Status: DNR Goals of care: Advanced Directive information Advanced Directives 03/18/2020  Does Patient Have a Medical Advance Directive? Yes  Type of Paramedic of [place];Living will  Does patient want to make changes to medical advance directive? No - Patient declined  Copy of [place] in Chart? No - copy requested  Would patient like information on creating a medical advance directive? -     Chief Complaint  Patient presents with  . Acute Visit    Fall    HPI:  Pt is a 86 y.o. male seen today for an acute visit for reported fall, the patient was found sitting on the floor when trying to help another resident with their wheelchair, no apparent injury.   New admit to SNF [facility] 03/16/20 for therapy following hospital stay 03/11/20-03/16/20 for NSTEMI, troponin 300s at ED, Cath showed LAD stenosis, underwent drug eluting stent, EF 55-60%, takes Atorvastatin, ASA, Plavix, Metoprolol.   HTN, takes Metoprolol.   CAD, NSTEMI stent f/u cardiology  GERD, takes Pantoprazole  AI, EF 55-60%  CKD stage 3, hydrated in hospital, pending f/u BMP  Alzheimer's dementia, didn't tolerate memory meds, underwent neurology eval in the past.   Peripheral neuropathy, takes Gabapentin  Depression takes Sertraline   Past  Medical History:  Diagnosis Date  . Anemia   . Arthritis    arthritis,osteopenia,"spinal stenosis"  . Bladder neck obstruction 03/28/2011  . CAD (coronary artery disease)   . Cancer [facility]) 12-06-12   Prostate cancer'98  . CKD (chronic kidney disease) stage 3, GFR 30-59 ml/min (HCC) 08/17/2017  . Depression   . Essential hypertension, benign 04/10/2009  . GERD (gastroesophageal reflux disease)   . GERD without esophagitis 11/05/2016  . H/O hiatal hernia   . H/O vertigo 12-06-12   none recent  . Hyperlipidemia   . IBS (irritable bowel syndrome)   . Mixed hyperlipidemia 04/10/2009  . Neuropathy   . Osteopenia   . Peripheral neuropathy 12-06-12   peripheral neuropathy  . Rhinitis   . RLS (restless legs syndrome)   . Vascular dementia ([place]) 09/30/2019  . Vitamin B12 deficiency    Past Surgical History:  Procedure Laterality Date  . APPENDECTOMY    . CARPAL TUNNEL RELEASE     both hands  . cataract surgery Left 12-06-12   recent surgery  . CHOLECYSTECTOMY    . COLONOSCOPY WITH PROPOFOL N/A 12/25/2012   Procedure: COLONOSCOPY WITH PROPOFOL;  Surgeon: Garlan Fair, MD;  Location: [facility];  Service: Endoscopy;  Laterality: N/A;  . CORONARY STENT INTERVENTION N/A 03/13/2020   Procedure: CORONARY STENT INTERVENTION;  Surgeon: Nelva Bush, MD;  Location: [place] CV LAB;  Service: Cardiovascular;  Laterality: N/A;  . ESOPHAGOGASTRODUODENOSCOPY (EGD) WITH PROPOFOL N/A 12/25/2012   Procedure: ESOPHAGOGASTRODUODENOSCOPY (EGD) WITH PROPOFOL;  Surgeon: Garlan Fair, MD;  Location: [facility];  Service: Endoscopy;  Laterality: N/A;  . INTRAVASCULAR ULTRASOUND/IVUS N/A 03/13/2020   Procedure: Intravascular Ultrasound/IVUS;  Surgeon: Nelva Bush, MD;  Location: [place] [facility] LAB;  Service: Cardiovascular;  Laterality: N/A;  . LEFT HEART CATH AND CORONARY ANGIOGRAPHY N/A 03/13/2020   Procedure: LEFT HEART CATH AND CORONARY ANGIOGRAPHY;  Surgeon: Nelva Bush, MD;   Location: [place] CV LAB;  Service: Cardiovascular;  Laterality: N/A;  . PROSTATE SURGERY  12-06-12  . TONSILLECTOMY    . TOTAL KNEE ARTHROPLASTY Right 11/25/2014   Procedure: RIGHT TOTAL KNEE ARTHROPLASTY;  Surgeon: Melrose Nakayama, MD;  Location: [facility];  Service: Orthopedics;  Laterality: Right;    Allergies  Allergen Reactions  . Niacin Itching  . Penicillins Other (See Comments)    "rash"   . Tramadol Itching    Allergies as of 03/18/2020      Reactions   Niacin Itching   Penicillins Other (See Comments)   "rash"   Tramadol Itching      Medication List       Accurate as of March 18, 2020 11:59 PM. If you have any questions, ask your nurse or doctor.        aspirin EC 81 MG tablet Take 1 tablet (81 mg total) by mouth as needed for pain (headache). What changed: when to take this   atorvastatin 80 MG tablet Commonly known as: LIPITOR Take 1 tablet (80 mg total) by mouth daily.   clopidogrel 75 MG tablet Commonly known as: PLAVIX Take 1 tablet (75 mg total) by mouth daily with breakfast.   gabapentin 400 MG capsule Commonly known as: NEURONTIN Take 1 capsule 3 times a day for nerve pain. What changed:   how much to take  how to take this  when to take this  additional instructions   metoprolol tartrate 25 MG tablet Commonly known as: LOPRESSOR Take 1 tablet (25 mg total) by mouth 2 (two) times daily.   nitroGLYCERIN 0.4 MG SL tablet Commonly known as: NITROSTAT Place 1 tablet (0.4 mg total) under the tongue every 5 (five) minutes as needed for chest pain.   pantoprazole 40 MG tablet Commonly known as: PROTONIX Take 1 tablet (40 mg total) by mouth daily.   sertraline 100 MG tablet Commonly known as: ZOLOFT 1 tablet Daily for Mood What changed:   how much to take  how to take this  when to take this  additional instructions   Vitamin D 50 MCG (2000 UT) tablet Take 1 pill daily. What changed:   how much to take  how to take  this  when to take this  additional instructions       Review of Systems  Constitutional: Negative for activity change, appetite change and fever.  HENT: Positive for hearing loss. Negative for congestion and voice change.   Eyes: Negative for visual disturbance.  Respiratory: Negative for cough and shortness of breath.   Cardiovascular: Negative for chest pain, palpitations and leg swelling.  Gastrointestinal: Negative for abdominal pain, constipation, nausea and vomiting.  Genitourinary: Negative for difficulty urinating, dysuria, frequency and urgency.  Musculoskeletal: Positive for gait problem.  Neurological: Negative for dizziness, speech difficulty, weakness and headaches.       Memory lapses.   Psychiatric/Behavioral: Negative for agitation, behavioral problems, hallucinations and sleep disturbance. The patient is not nervous/anxious.     Immunization History  Administered Date(s) Administered  . Influenza, High Dose Seasonal PF 03/05/2015, 03/05/2018  . Influenza-Unspecified 04/22/2016, 03/13/2017, 06/13/2018  . Pneumococcal Conjugate-13 05/23/2017  .  Pneumococcal Polysaccharide-23 01/20/2016   Pertinent  Health Maintenance Due  Topic Date Due  . INFLUENZA VACCINE  01/12/2020  . PNA vac Low Risk Adult  Completed   Fall Risk  02/26/2020 09/09/2019 02/14/2019 07/26/2018 12/03/2017  Falls in the past year? 0 0 0 0 No  Number falls in past yr: 0 - 0 - -  Injury with Fall? 0 - 0 - -  Risk Factor Category  - - - - -  Risk for fall due to : No Fall Risks No Fall Risks - - -  Follow up Falls evaluation completed;Falls prevention discussed Falls prevention discussed;Education provided;Falls evaluation completed - - -   Functional Status Survey:    Vitals:   03/18/20 1206  BP: 120/70  Pulse: 77  Resp: 20  Temp: 98 F (36.7 C)  SpO2: 96%  Weight: 157 lb 14.4 oz (71.6 kg)  Height: 6' (1.829 m)   Body mass index is 21.42 kg/m. Physical Exam Vitals and nursing note  reviewed.  Constitutional:      Appearance: Normal appearance.  HENT:     Head: Normocephalic and atraumatic.     Nose: Nose normal.     Mouth/Throat:     Mouth: Mucous membranes are moist.  Eyes:     Extraocular Movements: Extraocular movements intact.     Conjunctiva/sclera: Conjunctivae normal.     Pupils: Pupils are equal, round, and reactive to light.  Cardiovascular:     Rate and Rhythm: Normal rate and regular rhythm.     Heart sounds: No murmur heard.   Pulmonary:     Effort: Pulmonary effort is normal.     Breath sounds: No wheezing.  Abdominal:     General: Bowel sounds are normal.     Palpations: Abdomen is soft.     Tenderness: There is no abdominal tenderness.  Musculoskeletal:     Cervical back: Normal range of motion and neck supple.     Right lower leg: No edema.     Left lower leg: No edema.  Skin:    General: Skin is warm and dry.     Findings: Bruising present.     Comments: Ecchymoses right hand/arm  Neurological:     General: No focal deficit present.     Mental Status: He is alert. Mental status is at baseline.     Motor: No weakness.     Gait: Gait abnormal.     Comments: Oriented to person, place  Psychiatric:        Mood and Affect: Mood normal.        Behavior: Behavior normal.     Labs reviewed: Recent Labs    09/10/19 1524 09/30/19 0209 02/26/20 1620 03/11/20 2307 03/14/20 0059 03/15/20 0102 03/16/20 0120  NA 142   < > 143   < > 139 138 139  K 4.3   < > 5.0   < > 3.7 4.0 4.5  CL 105   < > 106   < > 109 107 108  CO2 30   < > 30   < > 21* 23 24  GLUCOSE 92   < > 113*   < > 96 113* 106*  BUN 26*   < > 29*   < > 18 21 24*  CREATININE 1.94*   < > 1.84*   < > 1.52* 1.88* 2.08*  CALCIUM 9.0   < > 9.6   < > 8.2* 8.4* 8.6*  MG 2.1  --  2.3  --   --   --   --    < > =  values in this interval not displayed.   Recent Labs    09/30/19 0209 11/12/19 1536 02/26/20 1620  AST 15 11 13   ALT 11 7* 8*  ALKPHOS 53  --   --   BILITOT 0.3  0.5 0.6  PROT 5.7* 6.2 6.6  ALBUMIN 3.4*  --   --    Recent Labs    09/10/19 1524 09/30/19 0209 11/12/19 1536 11/12/19 1536 02/26/20 1620 03/11/20 2307 03/13/20 0045 03/14/20 0059 03/15/20 0102  WBC 7.5   < > 6.6   < > 8.4   < > 6.5 8.3 7.5  NEUTROABS 5,775  --  5,029  --  6,275  --   --   --   --   HGB 12.0*   < > 11.4*   < > 13.0*   < > 11.5* 11.0* 10.5*  HCT 35.9*   < > 33.6*   < > 38.4*   < > 33.7* 32.6* 30.5*  MCV 96.2   < > 98.0   < > 97.0   < > 97.4 96.7 97.1  PLT 129*   < > 123*   < > 145   < > 112* 116* 103*   < > = values in this interval not displayed.   Lab Results  Component Value Date   TSH 3.715 03/12/2020   Lab Results  Component Value Date   HGBA1C 5.1 09/10/2019   Lab Results  Component Value Date   CHOL 171 02/26/2020   HDL 41 02/26/2020   LDLCALC 104 (H) 02/26/2020   TRIG 151 (H) 02/26/2020   CHOLHDL 4.2 02/26/2020    Significant Diagnostic Results in last 30 days:  DG Chest 2 View  Result Date: 03/11/2020 CLINICAL DATA:  Chest pain EXAM: CHEST - 2 VIEW COMPARISON:  09/30/2019 FINDINGS: The heart size and mediastinal contours are within normal limits. Both lungs are clear. The visualized skeletal structures are unremarkable. Aortic atherosclerosis. Degenerative changes of the spine. IMPRESSION: No active cardiopulmonary disease. Electronically Signed   By: Donavan Foil M.D.   On: 03/11/2020 22:54   CARDIAC CATHETERIZATION  Addendum Date: 03/13/2020   Conclusions: 1. Severe single-vessel coronary artery disease with multifocal proximal through distal LAD disease of up to 95%.  Mild, non-obstructive CAD involving LCx and RCA is also present. 2. Upper normal left ventricular filling pressure. 3. Successful IVUS-guided PCI to proximal and mid LAD using overlapping Resolute Onyx 3.0 x 12 mm (proximal) and 2.75 x 12 mm (distal) drug-eluting stents with 0% residual stenosis and TIMI-3 flow.  Recommendations: 1. Dual antiplatelet therapy with aspirin and  clopidogrel for 12 months (if tolerated). 2. Aggressive secondary prevention. 3. Gentle post-catheterization hydration with continued monitoring of renal function. Nelva Bush, MD [facility]   Result Date: 03/13/2020 Conclusions: 1. Severe single-vessel coronary artery disease with multifocal proximal through distal LAD disease of up to 95%.  Mild, non-obstructive CAD involving LCx and RCA is also present. 2. Upper normal left ventricular filling pressure. 3. Successful IVUS-guided PCI to proximal and mid LAD using overlapping Resolute Onyx 3.0 x 12 mm (proximal) and 2.75 x 12 mm (distal) drug-eluting stents with 0% residual stenosis and TIMI-3 flow.  Recommendations: 1. Dual antiplatelet therapy with aspirin and clopidogrel for 12 months (if tolerated). 2. Aggressive secondary prevention. 3. Gentle post-catheterization hydration with continued monitoring of renal function. Nelva Bush, MD [facility]   ECHOCARDIOGRAM COMPLETE  Result Date: 03/12/2020    ECHOCARDIOGRAM REPORT   Patient Name:   Ryan Humphrey  [place] Date of Exam: 03/12/2020 Medical Rec #:  [id number]      Height:       70.0 in Accession #:    [id number]     Weight:       158.8 lb Date of Birth:  1928-11-29     BSA:          1.892 m Patient Age:    72 years       BP:           154/68 mmHg Patient Gender: M              HR:           64 bpm. Exam Location:  Inpatient Procedure: 2D Echo and Intracardiac Opacification Agent Indications:    NSTEMI I21.4  History:        Patient has prior history of Echocardiogram examinations, most                 recent 09/30/2019. CAD, TIA, Signs/Symptoms:Chest Pain and                 Vascular dementia; Risk Factors:Hypertension and Dyslipidemia.                 Chronic kidney disease,.  Sonographer:    Darlina Sicilian RDCS Referring Phys: [id number] [place]  1. Difficult study due to poor visualization of the endocardial border despite use of IV contrast agent. Based on limited views,  no wall motion abnormalities noted. LVEF appears to be normal at estimated 55-60%. Left ventricular diastolic parameters are  indeterminate.  2. Right ventricular systolic function is normal. The right ventricular size is normal.  3. The aortic valve is tricuspid. Aortic valve regurgitation is mild to moderate.  4. Aortic dilatation noted. There is mild dilatation of the aortic root, measuring 41 mm.  5. The inferior vena cava is dilated in size with >50% respiratory variability, suggesting right atrial pressure of 8 mmHg.  6. The mitral valve is normal in structure. Trivial mitral valve regurgitation. Comparison(s): Compared to prior TTE on 09/30/19, there appears to be mild-to-moderate aortic regurgitation. FINDINGS  Left Ventricle: Difficult study due to poor visualization of the endocardial border despite use of IV contrast agent. Based on limited views, no wall motion abnormalities noted. LVEF appears to be normal at estimated 55-60%.. Definity contrast agent was  given IV to delineate the left ventricular endocardial borders. The left ventricular internal cavity size was normal in size. There is no left ventricular hypertrophy. Left ventricular diastolic parameters are indeterminate. Right Ventricle: The right ventricular size is normal. Right vetricular wall thickness was not assessed. Right ventricular systolic function is normal. Left Atrium: Left atrial size was normal in size. Right Atrium: Right atrial size was not well visualized. Pericardium: There is no evidence of pericardial effusion. Mitral Valve: The mitral valve is normal in structure. Trivial mitral valve regurgitation. Tricuspid Valve: The tricuspid valve is normal in structure. Tricuspid valve regurgitation is trivial. Aortic Valve: The aortic valve is tricuspid. Aortic valve regurgitation is mild to moderate. Pulmonic Valve: The pulmonic valve was normal in structure. Pulmonic valve regurgitation is not visualized. Aorta: Aortic dilatation  noted. There is mild dilatation of the aortic root, measuring 41 mm. Venous: The inferior vena cava is dilated in size with greater than 50% respiratory variability, suggesting right atrial pressure of 8 mmHg. IAS/Shunts: No atrial level shunt detected by color flow Doppler.  LEFT VENTRICLE PLAX 2D LVOT diam:  1.90 cm  Diastology LV SV:         40       LV e' medial:    4.35 cm/s LV SV Index:   21       LV E/e' medial:  13.1 LVOT Area:     2.84 cm LV e' lateral:   6.15 cm/s                         LV E/e' lateral: 9.3  RIGHT VENTRICLE RV S prime:     3.60 cm/s LEFT ATRIUM             Index LA Vol (A2C):   43.3 ml 22.88 ml/m LA Vol (A4C):   44.8 ml 23.67 ml/m LA Biplane Vol: 45.0 ml 23.78 ml/m  AORTIC VALVE LVOT Vmax:   58.10 cm/s LVOT Vmean:  37.300 cm/s LVOT VTI:    0.141 m AI PHT:      521 msec  AORTA Ao Root diam: 4.10 cm MITRAL VALVE MV Area (PHT): 2.69 cm    SHUNTS MV Decel Time: 282 msec    Systemic VTI:  0.14 m MV E velocity: 57.00 cm/s  Systemic Diam: 1.90 cm MV A velocity: 79.70 cm/s MV E/A ratio:  0.72 Gwyndolyn Kaufman MD Electronically signed by Gwyndolyn Kaufman MD Signature Date/Time: 03/12/2020/6:14:48 PM    Final     Assessment/Plan: Fall for reported fall, the patient was found sitting on the floor when trying to help another resident with their wheelchair, no apparent injury. Mechanical fall, emphasizes safety awareness.    Alzheimer's dementia without behavioral disturbance ([place]) Close supervision for safety, didn't tolerate memory preserving meds. The patient has no recollection of the fall event, but he did remember his recent hospitalization, asked me what stents are, remembered his brothers.   CORONARY ATHEROSCLEROSIS NATIVE CORONARY ARTERY New admit to SNF [facility] 03/16/20 for therapy following hospital stay 03/11/20-03/16/20 for NSTEMI, troponin 300s at ED, Cath showed LAD stenosis, underwent drug eluting stent, EF 55-60%, takes Atorvastatin, ASA, Plavix, Metoprolol.    Essential hypertension, benign Controlled blood pressure, continue Metoprolol. Will hold Metoprolol if Bp<90/35mmHg  GERD without esophagitis Stable, continue Pantoprazole.   Hereditary and idiopathic peripheral neuropathy F/u neurology, continue Gabapentin  CKD (chronic kidney disease) stage 3, GFR 30-59 ml/min Hydrated in hospital, pending f/u BMP  Depression, recurrent (HCC) Stable, continue Sertraline.     Family/ staff Communication: plan of care reviewed with the patient and charge nurse.   Labs/tests ordered: none  Time spend 35 minutes.

## 2020-03-19 ENCOUNTER — Encounter: Payer: Self-pay | Admitting: Nurse Practitioner

## 2020-03-19 NOTE — Telephone Encounter (Signed)
3rd attempt to contact patient.  Called daughter [name]'s number. To call back  Id no response by the end of the day will close encounter

## 2020-03-20 LAB — CBC AND DIFFERENTIAL
HCT: 30 — AB (ref 41–53)
Hemoglobin: 10.3 — AB (ref 13.5–17.5)
Neutrophils Absolute: 4864
Platelets: 99 — AB (ref 150–399)
WBC: 6.4

## 2020-03-20 LAB — BASIC METABOLIC PANEL
BUN: 25 — AB (ref 4–21)
CO2: 23 — AB (ref 13–22)
Chloride: 110 — AB (ref 99–108)
Creatinine: 1.9 — AB (ref 0.6–1.3)
Glucose: 111
Potassium: 4.1 (ref 3.4–5.3)
Sodium: 143 (ref 137–147)

## 2020-03-20 LAB — HEPATIC FUNCTION PANEL
ALT: 11 (ref 10–40)
AST: 13 — AB (ref 14–40)
Alkaline Phosphatase: 53 (ref 25–125)
Bilirubin, Total: 0.4

## 2020-03-20 LAB — CBC: RBC: 3.06 — AB (ref 3.87–5.11)

## 2020-03-20 LAB — COMPREHENSIVE METABOLIC PANEL
Albumin: 3.4 — AB (ref 3.5–5.0)
Calcium: 8.3 — AB (ref 8.7–10.7)
Globulin: 1.9

## 2020-03-30 ENCOUNTER — Encounter: Payer: Self-pay | Admitting: Internal Medicine

## 2020-03-30 ENCOUNTER — Non-Acute Institutional Stay (SKILLED_NURSING_FACILITY): Payer: Medicare Other | Admitting: Internal Medicine

## 2020-03-30 ENCOUNTER — Ambulatory Visit (INDEPENDENT_AMBULATORY_CARE_PROVIDER_SITE_OTHER): Payer: Medicare Other | Admitting: Internal Medicine

## 2020-03-30 ENCOUNTER — Other Ambulatory Visit: Payer: Self-pay

## 2020-03-30 VITALS — BP 112/56 | HR 67 | Temp 97.1°F | Ht 70.0 in | Wt 168.0 lb

## 2020-03-30 DIAGNOSIS — Z955 Presence of coronary angioplasty implant and graft: Secondary | ICD-10-CM | POA: Diagnosis not present

## 2020-03-30 DIAGNOSIS — D649 Anemia, unspecified: Secondary | ICD-10-CM | POA: Diagnosis not present

## 2020-03-30 DIAGNOSIS — G300 Alzheimer's disease with early onset: Secondary | ICD-10-CM | POA: Diagnosis not present

## 2020-03-30 DIAGNOSIS — I214 Non-ST elevation (NSTEMI) myocardial infarction: Secondary | ICD-10-CM

## 2020-03-30 DIAGNOSIS — N1832 Chronic kidney disease, stage 3b: Secondary | ICD-10-CM | POA: Diagnosis not present

## 2020-03-30 DIAGNOSIS — E782 Mixed hyperlipidemia: Secondary | ICD-10-CM | POA: Diagnosis not present

## 2020-03-30 DIAGNOSIS — F339 Major depressive disorder, recurrent, unspecified: Secondary | ICD-10-CM

## 2020-03-30 DIAGNOSIS — I10 Essential (primary) hypertension: Secondary | ICD-10-CM | POA: Diagnosis not present

## 2020-03-30 DIAGNOSIS — I25119 Atherosclerotic heart disease of native coronary artery with unspecified angina pectoris: Secondary | ICD-10-CM | POA: Diagnosis not present

## 2020-03-30 DIAGNOSIS — E785 Hyperlipidemia, unspecified: Secondary | ICD-10-CM | POA: Diagnosis not present

## 2020-03-30 DIAGNOSIS — F0280 Dementia in other diseases classified elsewhere without behavioral disturbance: Secondary | ICD-10-CM

## 2020-03-30 NOTE — Progress Notes (Signed)
Cardiology Office Note:    Date:  03/30/2020   ID:  Ryan Humphrey, DOB 06/19/1928, MRN [id number]  PCP:  Unk Pinto, MD  Cardiologist:  Elouise Munroe, MD  Electrophysiologist:  None   Referring MD: Unk Pinto, MD   Chief Complaint/Reason for Referral: Follow NSTEMI  History of Present Illness:    Ryan Humphrey is a 86 y.o. male with a history of CAD by cath in 2010, HLD, hypertension, dementia, CKD III and prostate cancer who presented for chest pain evaluation on  03/12/20, found to have elevated troponin, found to have NSTEMI and received PCI to proximal and mid Lad with overlapping DES on 03/13/20. Mild residual disease in LCX and RCA. DAPT recommended for minimum of 12 months.   He feel well today without complaint. No CP, no SOB, no palpitations. No syncope.   Echo during hospital stay showed normal EF and mild dilation of ascending aorta at aortic root.  Past Medical History:  Diagnosis Date  . Anemia   . Arthritis    arthritis,osteopenia,"spinal stenosis"  . Bladder neck obstruction 03/28/2011  . CAD (coronary artery disease)   . Cancer [facility]) 12-06-12   Prostate cancer'98  . CKD (chronic kidney disease) stage 3, GFR 30-59 ml/min (HCC) 08/17/2017  . Depression   . Essential hypertension, benign 04/10/2009  . GERD (gastroesophageal reflux disease)   . GERD without esophagitis 11/05/2016  . H/O hiatal hernia   . H/O vertigo 12-06-12   none recent  . Hyperlipidemia   . IBS (irritable bowel syndrome)   . Mixed hyperlipidemia 04/10/2009  . Neuropathy   . Osteopenia   . Peripheral neuropathy 12-06-12   peripheral neuropathy  . Rhinitis   . RLS (restless legs syndrome)   . Vascular dementia ([place]) 09/30/2019  . Vitamin B12 deficiency     Past Surgical History:  Procedure Laterality Date  . APPENDECTOMY    . CARPAL TUNNEL RELEASE     both hands  . cataract surgery Left 12-06-12   recent surgery  . CHOLECYSTECTOMY    . COLONOSCOPY WITH PROPOFOL N/A  12/25/2012   Procedure: COLONOSCOPY WITH PROPOFOL;  Surgeon: Garlan Fair, MD;  Location: [facility];  Service: Endoscopy;  Laterality: N/A;  . CORONARY STENT INTERVENTION N/A 03/13/2020   Procedure: CORONARY STENT INTERVENTION;  Surgeon: Nelva Bush, MD;  Location: [place] [facility];  Service: Cardiovascular;  Laterality: N/A;  . ESOPHAGOGASTRODUODENOSCOPY (EGD) WITH PROPOFOL N/A 12/25/2012   Procedure: ESOPHAGOGASTRODUODENOSCOPY (EGD) WITH PROPOFOL;  Surgeon: Garlan Fair, MD;  Location: [facility];  Service: Endoscopy;  Laterality: N/A;  . INTRAVASCULAR ULTRASOUND/IVUS N/A 03/13/2020   Procedure: Intravascular Ultrasound/IVUS;  Surgeon: Nelva Bush, MD;  Location: [place] [facility];  Service: Cardiovascular;  Laterality: N/A;  . LEFT HEART CATH AND CORONARY ANGIOGRAPHY N/A 03/13/2020   Procedure: LEFT HEART CATH AND CORONARY ANGIOGRAPHY;  Surgeon: Nelva Bush, MD;  Location: [place] [facility];  Service: Cardiovascular;  Laterality: N/A;  . PROSTATE SURGERY  12-06-12  . TONSILLECTOMY    . TOTAL KNEE ARTHROPLASTY Right 11/25/2014   Procedure: RIGHT TOTAL KNEE ARTHROPLASTY;  Surgeon: Melrose Nakayama, MD;  Location: [facility];  Service: Orthopedics;  Laterality: Right;    Current Medications: Current Meds  Medication Sig  . aspirin 81 MG tablet Take 1 tablet (81 mg total) by mouth as needed for pain (headache). (Patient taking differently: Take 81 mg by mouth daily. )  . atorvastatin (LIPITOR) 80 MG tablet Take 1 tablet (80 mg total)  by mouth daily.  . Cholecalciferol (VITAMIN D) 50 MCG (2000 UT) tablet Take 1 pill daily. (Patient taking differently: Take 2,000 Units by mouth daily. )  . clopidogrel (PLAVIX) 75 MG tablet Take 1 tablet (75 mg total) by mouth daily with breakfast.  . gabapentin (NEURONTIN) 400 MG capsule Take 1 capsule 3 times a day for nerve pain. (Patient taking differently: Take 400 mg by mouth 3 (three) times daily. )  . metoprolol tartrate (LOPRESSOR) 25  MG tablet Take 1 tablet (25 mg total) by mouth 2 (two) times daily.  . nitroGLYCERIN (NITROSTAT) 0.4 MG SL tablet Place 1 tablet (0.4 mg total) under the tongue every 5 (five) minutes as needed for chest pain.  . pantoprazole (PROTONIX) 40 MG tablet Take 1 tablet (40 mg total) by mouth daily.  . sertraline (ZOLOFT) 100 MG tablet 1 tablet Daily for Mood (Patient taking differently: Take 100 mg by mouth daily. )     Allergies:   Niacin, Penicillins, and Tramadol   Social History   Tobacco Use  . Smoking status: Former Smoker    Packs/day: 0.50    Years: 30.00    Pack years: 15.00    Quit date: 06/14/1963    Years since quitting: 56.8  . Smokeless tobacco: Never Used  Vaping Use  . Vaping Use: Never used  Substance Use Topics  . Alcohol use: Yes    Alcohol/week: 0.0 standard drinks    Comment: Socially  . Drug use: No     Family History: The patient's family history includes CAD in his father; Dementia in his brother and mother; Diabetes in his brother; Hyperlipidemia in his brother; Hypertension in his brother. There is no history of Colon cancer, Colon polyps, Kidney disease, Esophageal cancer, or Gallbladder disease.  ROS:   Please see the history of present illness.    All other systems reviewed and are negative.  EKGs/Labs/Other Studies Reviewed:    The following studies were reviewed today:  EKG:  NSR, RBBB  I have independently reviewed the images from coronary angiogram 03/13/20.  Recent Labs: 02/26/2020: ALT 8; Magnesium 2.3 03/12/2020: TSH 3.715 03/15/2020: Hemoglobin 10.5; Platelets 103 03/16/2020: BUN 24; Creatinine, Ser 2.08; Potassium 4.5; Sodium 139  Recent Lipid Panel    Component Value Date/Time   CHOL 171 02/26/2020 1620   TRIG 151 (H) 02/26/2020 1620   HDL 41 02/26/2020 1620   CHOLHDL 4.2 02/26/2020 1620   VLDL 10 09/30/2019 0755   LDLCALC 104 (H) 02/26/2020 1620    Physical Exam:    VS:  BP (!) 112/56 (BP Location: Left Arm, Patient Position:  Sitting, Cuff Size: Normal)   Pulse 67   Temp (!) 97.1 F (36.2 C)   Ht 5\' 10"  (1.778 m)   Wt 168 lb (76.2 kg)   BMI 24.11 kg/m     Wt Readings from Last 5 Encounters:  03/30/20 168 lb (76.2 kg)  03/18/20 157 lb 14.4 oz (71.6 kg)  03/17/20 155 lb 12.8 oz (70.7 kg)  03/12/20 155 lb 3.3 oz (70.4 kg)  02/26/20 158 lb 12.8 oz (72 kg)    Constitutional: No acute distress Cardiovascular: regular rhythm, normal rate, no murmurs. No jugular venous distention.  Respiratory: clear to auscultation bilaterally GI : normal bowel sounds, soft and nontender. No distention.   MSK: extremities warm, well perfused. No edema.  NEURO: grossly nonfocal exam, moves all extremities. PSYCH: alert and oriented x 3, normal mood and affect.   ASSESSMENT:    1. Coronary  artery disease involving native heart with angina pectoris, unspecified vessel or lesion type ([place])   2. NSTEMI (non-ST elevated myocardial infarction) ([place])   3. S/P drug eluting coronary stent placement   4. Mixed hyperlipidemia   5. Essential hypertension, benign    PLAN:    Doing well overall. Continue DAPT for at least 12 months. I have instructed the patient that dual antiplatelet therapy should be taken for 1 year without interruption.  We have discussed the consequences of interrupted dual antiplatelet therapy and the risk for in-stent thrombosis. ASA is indefinitely therapy due to PCI.  Continue statin, BB, prn nitro.    Total time of encounter: 30 minutes total time of encounter, including 20 minutes spent in face-to-face patient care on the date of this encounter. This time includes coordination of care and counseling regarding above mentioned problem list. Remainder of non-face-to-face time involved reviewing chart documents/testing relevant to the patient encounter and documentation in the medical record. I have independently reviewed documentation from referring provider.   Cherlynn Kaiser, MD [place]  [facility]     Medication Adjustments/Labs and Tests Ordered: Current medicines are reviewed at length with the patient today.  Concerns regarding medicines are outlined above.   Orders Placed This Encounter  Procedures  . EKG 12-Lead    No orders of the defined types were placed in this encounter.   Patient Instructions  Medication Instructions:  No Changes In Medications at this time.  *If you need a refill on your cardiac medications before your next appointment, please call your pharmacy*  Lab Work: None Ordered At This Time.  If you have labs (blood work) drawn today and your tests are completely normal, you will receive your results only by: Marland Kitchen MyChart Message (if you have MyChart) OR . A paper copy in the mail If you have any lab test that is abnormal or we need to change your treatment, we will call you to review the results.  Testing/Procedures: None Ordered At This Time.   Follow-Up: At [facility], you and your health needs are our priority.  As part of our continuing mission to provide you with exceptional heart care, we have created designated Provider Care Teams.  These Care Teams include your primary Cardiologist (physician) and Advanced Practice Providers (APPs -  Physician Assistants and Nurse Practitioners) who all work together to provide you with the care you need, when you need it.  Your next appointment:   6 month(s)  The format for your next appointment:   In Person  Provider:   Cherlynn Kaiser, MD

## 2020-03-30 NOTE — Patient Instructions (Signed)
Medication Instructions:  No Changes In Medications at this time.  *If you need a refill on your cardiac medications before your next appointment, please call your pharmacy*  Lab Work: None Ordered At This Time.  If you have labs (blood work) drawn today and your tests are completely normal, you will receive your results only by: . MyChart Message (if you have MyChart) OR . A paper copy in the mail If you have any lab test that is abnormal or we need to change your treatment, we will call you to review the results.  Testing/Procedures: None Ordered At This Time.   Follow-Up: At [facility], you and your health needs are our priority.  As part of our continuing mission to provide you with exceptional heart care, we have created designated Provider Care Teams.  These Care Teams include your primary Cardiologist (physician) and Advanced Practice Providers (APPs -  Physician Assistants and Nurse Practitioners) who all work together to provide you with the care you need, when you need it.  Your next appointment:   6 month(s)  The format for your next appointment:   In Person  Provider:   Gayatri Acharya, MD   

## 2020-03-30 NOTE — Progress Notes (Signed)
Location:   [facility] Room Number: 34 Place of Service:  SNF [id number])  Provider: Veleta Miners MD  PCP: Unk Pinto, MD Patient Care Team: Unk Pinto, MD as PCP - General (Internal Medicine) Elouise Munroe, MD as PCP - Cardiology (Cardiology)  Extended Emergency Contact Information Primary Emergency Contact: Weeks,[name] Address: [address]          [place], [place] [address] [name] of [place] Phone: [phone] Mobile Phone: [phone] Relation: Daughter Secondary Emergency Contact: [name], [place] of [place] Phone: [phone] Relation: Daughter  Code Status: Full Code Goals of care:  Advanced Directive information Advanced Directives 03/18/2020  Does Patient Have a Medical Advance Directive? Yes  Type of Paramedic of [place];Living will  Does patient want to make changes to medical advance directive? No - Patient declined  Copy of [place] in Chart? No - copy requested  Would patient like information on creating a medical advance directive? -     Allergies  Allergen Reactions  . Niacin Itching  . Penicillins Other (See Comments)    "rash"   . Tramadol Itching    Chief Complaint  Patient presents with  . Discharge Note    Discharge from SNF    HPI:  86 y.o. male  To be discharged to AL  Admitted from 9/29-10/4 for NSTEMI, and Renal Insufficiency,   Has h/o Hypertension,HLD, h/o CAD, Moderate AI, Chronic CKD stage 3, Alzheimer's Dementia   Went to ED for Substernal Chest pain on rest In ED had Troponin's peaked at 300. EKG RBBB.  Taken for Cath which showed LAD stenosis underwent with Drug Eluting Stent LVEF was 55-60% with no Wall motion abnormalities but Moderate AI Statin changed to Lipitor Worsening of Renal Fucntion of 1.8 to 2.4 Some better with Hydration  Admitted to SNF for therapy Did very well. No  More chest pain or SOB. Walks with his walker.  To be transferred to AL this week  Past Medical History:  Diagnosis Date  . Anemia   . Arthritis    arthritis,osteopenia,"spinal stenosis"  . Bladder neck obstruction 03/28/2011  . CAD (coronary artery disease)   . Cancer [facility]) 12-06-12   Prostate cancer'98  . CKD (chronic kidney disease) stage 3, GFR 30-59 ml/min (HCC) 08/17/2017  . Depression   . Essential hypertension, benign 04/10/2009  . GERD (gastroesophageal reflux disease)   . GERD without esophagitis 11/05/2016  . H/O hiatal hernia   . H/O vertigo 12-06-12   none recent  . Hyperlipidemia   . IBS (irritable bowel syndrome)   . Mixed hyperlipidemia 04/10/2009  . Neuropathy   . Osteopenia   . Peripheral neuropathy 12-06-12   peripheral neuropathy  . Rhinitis   . RLS (restless legs syndrome)   . Vascular dementia ([place]) 09/30/2019  . Vitamin B12 deficiency     Past Surgical History:  Procedure Laterality Date  . APPENDECTOMY    . CARPAL TUNNEL RELEASE     both hands  . cataract surgery Left 12-06-12   recent surgery  . CHOLECYSTECTOMY    . COLONOSCOPY WITH PROPOFOL N/A 12/25/2012   Procedure: COLONOSCOPY WITH PROPOFOL;  Surgeon: Garlan Fair, MD;  Location: [facility];  Service: Endoscopy;  Laterality: N/A;  . CORONARY STENT INTERVENTION N/A 03/13/2020   Procedure: CORONARY STENT INTERVENTION;  Surgeon: Nelva Bush, MD;  Location: [facility];  Service:  Cardiovascular;  Laterality: N/A;  . ESOPHAGOGASTRODUODENOSCOPY (EGD) WITH PROPOFOL N/A 12/25/2012   Procedure: ESOPHAGOGASTRODUODENOSCOPY (EGD) WITH PROPOFOL;  Surgeon: Garlan Fair, MD;  [facility]: [facility];  Service: Endoscopy;  Laterality: N/A;  . INTRAVASCULAR ULTRASOUND/IVUS N/A 03/13/2020   Procedure: Intravascular Ultrasound/IVUS;  Surgeon: Nelva Bush, MD;  Location: [facility] CV [facility];  Service: Cardiovascular;  Laterality: N/A;  . LEFT HEART CATH AND CORONARY ANGIOGRAPHY N/A 03/13/2020    Procedure: LEFT HEART CATH AND CORONARY ANGIOGRAPHY;  Surgeon: Nelva Bush, MD;  Location: [facility] CV LAB;  Service: Cardiovascular;  Laterality: N/A;  . PROSTATE SURGERY  12-06-12  . TONSILLECTOMY    . TOTAL KNEE ARTHROPLASTY Right 11/25/2014   Procedure: RIGHT TOTAL KNEE ARTHROPLASTY;  Surgeon: Melrose Nakayama, MD;  Location: [facility];  Service: Orthopedics;  Laterality: Right;      reports that he quit smoking about 56 years ago. He has a 15.00 pack-year smoking history. He has never used smokeless tobacco. He reports current alcohol use. He reports that he does not use drugs. Social History   Socioeconomic History  . Marital status: Widowed    Spouse name: Not on file  . Number of children: 2  . Years of education: College  . Highest education level: Not on file  Occupational History  . Occupation: Retired  Tobacco Use  . Smoking status: Former Smoker    Packs/day: 0.50    Years: 30.00    Pack years: 15.00    Quit date: 06/14/1963    Years since quitting: 56.8  . Smokeless tobacco: Never Used  Vaping Use  . Vaping Use: Never used  Substance and Sexual Activity  . Alcohol use: Yes    Alcohol/week: 0.0 standard drinks    Comment: Socially  . Drug use: No  . Sexual activity: Not Currently  Other Topics Concern  . Not on file  Social History Narrative   Pt lives at home alone, widowed in 2011, married for 55 years. They have two grown daugthers.   Plays the piano.   Caffeine Use: Rarely   Social Determinants of Health   Financial Resource Strain:   . Difficulty of Paying Living Expenses: Not on file  Food Insecurity:   . Worried About Charity fundraiser in the Last Year: Not on file  . Ran Out of Food in the Last Year: Not on file  Transportation Needs:   . Lack of Transportation (Medical): Not on file  . Lack of Transportation (Non-Medical): Not on file  Physical Activity:   . Days of Exercise per Week: Not on file  . Minutes of Exercise per Session: Not on  file  Stress:   . Feeling of Stress : Not on file  Social Connections:   . Frequency of Communication with Friends and Family: Not on file  . Frequency of Social Gatherings with Friends and Family: Not on file  . Attends Religious Services: Not on file  . Active Member of Clubs or Organizations: Not on file  . Attends Archivist Meetings: Not on file  . Marital Status: Not on file  Intimate Partner Violence:   . Fear of Current or Ex-Partner: Not on file  . Emotionally Abused: Not on file  . Physically Abused: Not on file  . Sexually Abused: Not on file   Functional Status Survey:    Allergies  Allergen Reactions  . Niacin Itching  . Penicillins Other (See Comments)    "rash"   . Tramadol Itching  Pertinent  Health Maintenance Due  Topic Date Due  . INFLUENZA VACCINE  01/12/2020  . PNA vac Low Risk Adult  Completed    Medications: Allergies as of 03/30/2020      Reactions   Niacin Itching   Penicillins Other (See Comments)   "rash"   Tramadol Itching      Medication List       Accurate as of March 30, 2020  3:56 PM. If you have any questions, ask your nurse or doctor.        aspirin EC 81 MG tablet Take 1 tablet (81 mg total) by mouth as needed for pain (headache). What changed: when to take this   atorvastatin 80 MG tablet Commonly known as: LIPITOR Take 1 tablet (80 mg total) by mouth daily.   clopidogrel 75 MG tablet Commonly known as: PLAVIX Take 1 tablet (75 mg total) by mouth daily with breakfast.   gabapentin 400 MG capsule Commonly known as: NEURONTIN Take 1 capsule 3 times a day for nerve pain. What changed:   how much to take  how to take this  when to take this  additional instructions   metoprolol tartrate 25 MG tablet Commonly known as: LOPRESSOR Take 1 tablet (25 mg total) by mouth 2 (two) times daily.   nitroGLYCERIN 0.4 MG SL tablet Commonly known as: NITROSTAT Place 1 tablet (0.4 mg total) under the tongue  every 5 (five) minutes as needed for chest pain.   pantoprazole 40 MG tablet Commonly known as: PROTONIX Take 1 tablet (40 mg total) by mouth daily.   sertraline 100 MG tablet Commonly known as: ZOLOFT 1 tablet Daily for Mood   Vitamin D 50 MCG (2000 UT) tablet Take 1 pill daily. What changed:   how much to take  how to take this  when to take this  additional instructions       Review of Systems  Vitals:   03/30/20 1539  BP: 110/62  Pulse: 62  Resp: 20  Temp: 98.1 F (36.7 C)  SpO2: 98%  Weight: 166 lb 11.2 oz (75.6 kg)  Height: 6' (1.829 m)   Body mass index is 22.61 kg/m. Physical Exam  Constitutional:  Well-developed and well-nourished.  HENT:  Head: Normocephalic.  Mouth/Throat: Oropharynx is clear and moist.  Eyes: Pupils are equal, round, and reactive to light.  Neck: Neck supple.  Cardiovascular: Normal rate and normal heart sounds.   Pulmonary/Chest: Effort normal and breath sounds normal. No respiratory distress. No wheezes. She has no rales.  Abdominal: Soft. Bowel sounds are normal. No distension. There is no tenderness. There is no rebound.  Musculoskeletal: No edema.  Lymphadenopathy: none Neurological: No Focal deficits Skin: Skin is warm and dry.  Psychiatric: Normal mood and affect. Behavior is normal. Thought content normal.    Labs reviewed: Basic Metabolic Panel: Recent Labs    09/10/19 1524 09/30/19 0209 02/26/20 1620 03/11/20 2307 03/14/20 0059 03/14/20 0059 03/15/20 0102 03/16/20 0120 03/20/20 0000  NA 142   < > 143   < > 139   < > 138 139 143  K 4.3   < > 5.0   < > 3.7   < > 4.0 4.5 4.1  CL 105   < > 106   < > 109   < > 107 108 110*  CO2 30   < > 30   < > 21*   < > 23 24 23*  GLUCOSE 92   < > 113*   < >  96  --  113* 106*  --   BUN 26*   < > 29*   < > 18   < > 21 24* 25*  CREATININE 1.94*   < > 1.84*   < > 1.52*   < > 1.88* 2.08* 1.9*  CALCIUM 9.0   < > 9.6   < > 8.2*   < > 8.4* 8.6* 8.3*  MG 2.1  --  2.3  --   --    --   --   --   --    < > = values in this interval not displayed.   Liver Function Tests: Recent Labs    09/30/19 0209 09/30/19 0209 11/12/19 1536 02/26/20 1620 03/20/20 0000  AST 15   < > 11 13 13*  ALT 11   < > 7* 8* 11  ALKPHOS 53  --   --   --  53  BILITOT 0.3  --  0.5 0.6  --   PROT 5.7*  --  6.2 6.6  --   ALBUMIN 3.4*  --   --   --  3.4*   < > = values in this interval not displayed.   Recent Labs    09/30/19 0209  LIPASE 40   No results for input(s): AMMONIA in the last 8760 hours. CBC: Recent Labs    11/12/19 1536 11/12/19 1536 02/26/20 1620 03/11/20 2307 03/13/20 0045 03/13/20 0045 03/14/20 0059 03/15/20 0102 03/20/20 0000  WBC 6.6   < > 8.4   < > 6.5   < > 8.3 7.5 6.4  NEUTROABS 5,029  --  6,275  --   --   --   --   --  4,864.00  HGB 11.4*   < > 13.0*   < > 11.5*   < > 11.0* 10.5* 10.3*  HCT 33.6*   < > 38.4*   < > 33.7*   < > 32.6* 30.5* 30*  MCV 98.0   < > 97.0   < > 97.4  --  96.7 97.1  --   PLT 123*   < > 145   < > 112*   < > 116* 103* 99*   < > = values in this interval not displayed.   Cardiac Enzymes: No results for input(s): CKTOTAL, CKMB, CKMBINDEX, TROPONINI in the last 8760 hours. BNP: Invalid input(s): POCBNP CBG: No results for input(s): GLUCAP in the last 8760 hours.  Procedures and Imaging Studies During Stay: DG Chest 2 View  Result Date: 03/11/2020 CLINICAL DATA:  Chest pain EXAM: CHEST - 2 VIEW COMPARISON:  09/30/2019 FINDINGS: The heart size and mediastinal contours are within normal limits. Both lungs are clear. The visualized skeletal structures are unremarkable. Aortic atherosclerosis. Degenerative changes of the spine. IMPRESSION: No active cardiopulmonary disease. Electronically Signed   By: Donavan Foil M.D.   On: 03/11/2020 22:54   CARDIAC CATHETERIZATION  Addendum Date: 03/13/2020   Conclusions: 1. Severe single-vessel coronary artery disease with multifocal proximal through distal LAD disease of up to 95%.  Mild,  non-obstructive CAD involving LCx and RCA is also present. 2. Upper normal left ventricular filling pressure. 3. Successful IVUS-guided PCI to proximal and mid LAD using overlapping Resolute Onyx 3.0 x 12 mm (proximal) and 2.75 x 12 mm (distal) drug-eluting stents with 0% residual stenosis and TIMI-3 flow.  Recommendations: 1. Dual antiplatelet therapy with aspirin and clopidogrel for 12 months (if tolerated). 2. Aggressive secondary prevention. 3. Gentle post-catheterization hydration with continued  monitoring of renal function. Nelva Bush, MD [facility]   Result Date: 03/13/2020 Conclusions: 1. Severe single-vessel coronary artery disease with multifocal proximal through distal LAD disease of up to 95%.  Mild, non-obstructive CAD involving LCx and RCA is also present. 2. Upper normal left ventricular filling pressure. 3. Successful IVUS-guided PCI to proximal and mid LAD using overlapping Resolute Onyx 3.0 x 12 mm (proximal) and 2.75 x 12 mm (distal) drug-eluting stents with 0% residual stenosis and TIMI-3 flow.  Recommendations: 1. Dual antiplatelet therapy with aspirin and clopidogrel for 12 months (if tolerated). 2. Aggressive secondary prevention. 3. Gentle post-catheterization hydration with continued monitoring of renal function. Nelva Bush, MD [facility]   ECHOCARDIOGRAM COMPLETE  Result Date: 03/12/2020    ECHOCARDIOGRAM REPORT   Patient Name:   MEHAR KIRKWOOD Date of Exam: 03/12/2020 Medical Rec #:  [id number]      Height:       70.0 in Accession #:    [id number]     Weight:       158.8 lb Date of Birth:  July 27, 1928     BSA:          1.892 m Patient Age:    71 years       BP:           154/68 mmHg Patient Gender: M              HR:           64 bpm. Exam Location:  Inpatient Procedure: 2D Echo and Intracardiac Opacification Agent Indications:    NSTEMI I21.4  History:        Patient has prior history of Echocardiogram examinations, most                 recent 09/30/2019. CAD, TIA,  Signs/Symptoms:Chest Pain and                 Vascular dementia; Risk Factors:Hypertension and Dyslipidemia.                 Chronic kidney disease,.  Sonographer:    Darlina Sicilian RDCS Referring Phys: [id number] [place]  1. Difficult study due to poor visualization of the endocardial border despite use of IV contrast agent. Based on limited views, no wall motion abnormalities noted. LVEF appears to be normal at estimated 55-60%. Left ventricular diastolic parameters are  indeterminate.  2. Right ventricular systolic function is normal. The right ventricular size is normal.  3. The aortic valve is tricuspid. Aortic valve regurgitation is mild to moderate.  4. Aortic dilatation noted. There is mild dilatation of the aortic root, measuring 41 mm.  5. The inferior vena cava is dilated in size with >50% respiratory variability, suggesting right atrial pressure of 8 mmHg.  6. The mitral valve is normal in structure. Trivial mitral valve regurgitation. Comparison(s): Compared to prior TTE on 09/30/19, there appears to be mild-to-moderate aortic regurgitation. FINDINGS  Left Ventricle: Difficult study due to poor visualization of the endocardial border despite use of IV contrast agent. Based on limited views, no wall motion abnormalities noted. LVEF appears to be normal at estimated 55-60%.. Definity contrast agent was  given IV to delineate the left ventricular endocardial borders. The left ventricular internal cavity size was normal in size. There is no left ventricular hypertrophy. Left ventricular diastolic parameters are indeterminate. Right Ventricle: The right ventricular size is normal. Right vetricular wall thickness was not assessed. Right ventricular systolic function is normal. Left Atrium:  Left atrial size was normal in size. Right Atrium: Right atrial size was not well visualized. Pericardium: There is no evidence of pericardial effusion. Mitral Valve: The mitral valve is normal in  structure. Trivial mitral valve regurgitation. Tricuspid Valve: The tricuspid valve is normal in structure. Tricuspid valve regurgitation is trivial. Aortic Valve: The aortic valve is tricuspid. Aortic valve regurgitation is mild to moderate. Pulmonic Valve: The pulmonic valve was normal in structure. Pulmonic valve regurgitation is not visualized. Aorta: Aortic dilatation noted. There is mild dilatation of the aortic root, measuring 41 mm. Venous: The inferior vena cava is dilated in size with greater than 50% respiratory variability, suggesting right atrial pressure of 8 mmHg. IAS/Shunts: No atrial level shunt detected by color flow Doppler.  LEFT VENTRICLE PLAX 2D LVOT diam:     1.90 cm  Diastology LV SV:         40       LV e' medial:    4.35 cm/s LV SV Index:   21       LV E/e' medial:  13.1 LVOT Area:     2.84 cm LV e' lateral:   6.15 cm/s                         LV E/e' lateral: 9.3  RIGHT VENTRICLE RV S prime:     3.60 cm/s LEFT ATRIUM             Index LA Vol (A2C):   43.3 ml 22.88 ml/m LA Vol (A4C):   44.8 ml 23.67 ml/m LA Biplane Vol: 45.0 ml 23.78 ml/m  AORTIC VALVE LVOT Vmax:   58.10 cm/s LVOT Vmean:  37.300 cm/s LVOT VTI:    0.141 m AI PHT:      521 msec  AORTA Ao Root diam: 4.10 cm MITRAL VALVE MV Area (PHT): 2.69 cm    SHUNTS MV Decel Time: 282 msec    Systemic VTI:  0.14 m MV E velocity: 57.00 cm/s  Systemic Diam: 1.90 cm MV A velocity: 79.70 cm/s MV E/A ratio:  0.72 Gwyndolyn Kaufman MD Electronically signed by Gwyndolyn Kaufman MD Signature Date/Time: 03/12/2020/6:14:48 PM    Final     Assessment/Plan:    NSTEMI (non-ST elevated myocardial infarction) ([place]) Dual Therapy per Cardiology On Beta blocker and statin Follow up with Cardiology  Stage 3b chronic kidney disease ([place]) Creat less then 2 now Will need follow up as outpatient Early onset Alzheimer's dementia without behavioral disturbance ([place]) Not tolerated any meds Sees Neurologist Last MMSE in 2019 was 23 Depression,  recurrent ([place]) On Zoloft Hyperlipidemia, unspecified hyperlipidemia type Continue Lipitor Anemia, unspecified type Will need work up if no improvement.   Discharging to AL due to his worsening Cognition Will need follow up with his PCP Also will need follow up of his Renal function and Anemia    Future labs/tests needed:

## 2020-04-01 DIAGNOSIS — I214 Non-ST elevation (NSTEMI) myocardial infarction: Secondary | ICD-10-CM | POA: Diagnosis not present

## 2020-04-01 DIAGNOSIS — R2681 Unsteadiness on feet: Secondary | ICD-10-CM | POA: Diagnosis not present

## 2020-04-01 DIAGNOSIS — R41841 Cognitive communication deficit: Secondary | ICD-10-CM | POA: Diagnosis not present

## 2020-04-01 DIAGNOSIS — R29898 Other symptoms and signs involving the musculoskeletal system: Secondary | ICD-10-CM | POA: Diagnosis not present

## 2020-04-01 DIAGNOSIS — M6281 Muscle weakness (generalized): Secondary | ICD-10-CM | POA: Diagnosis not present

## 2020-04-02 DIAGNOSIS — M6281 Muscle weakness (generalized): Secondary | ICD-10-CM | POA: Diagnosis not present

## 2020-04-02 DIAGNOSIS — R29898 Other symptoms and signs involving the musculoskeletal system: Secondary | ICD-10-CM | POA: Diagnosis not present

## 2020-04-02 DIAGNOSIS — R41841 Cognitive communication deficit: Secondary | ICD-10-CM | POA: Diagnosis not present

## 2020-04-02 DIAGNOSIS — R2681 Unsteadiness on feet: Secondary | ICD-10-CM | POA: Diagnosis not present

## 2020-04-02 DIAGNOSIS — I214 Non-ST elevation (NSTEMI) myocardial infarction: Secondary | ICD-10-CM | POA: Diagnosis not present

## 2020-04-03 DIAGNOSIS — R2681 Unsteadiness on feet: Secondary | ICD-10-CM | POA: Diagnosis not present

## 2020-04-03 DIAGNOSIS — I214 Non-ST elevation (NSTEMI) myocardial infarction: Secondary | ICD-10-CM | POA: Diagnosis not present

## 2020-04-03 DIAGNOSIS — R41841 Cognitive communication deficit: Secondary | ICD-10-CM | POA: Diagnosis not present

## 2020-04-03 DIAGNOSIS — R29898 Other symptoms and signs involving the musculoskeletal system: Secondary | ICD-10-CM | POA: Diagnosis not present

## 2020-04-03 DIAGNOSIS — M6281 Muscle weakness (generalized): Secondary | ICD-10-CM | POA: Diagnosis not present

## 2020-04-06 DIAGNOSIS — I214 Non-ST elevation (NSTEMI) myocardial infarction: Secondary | ICD-10-CM | POA: Diagnosis not present

## 2020-04-06 DIAGNOSIS — R2681 Unsteadiness on feet: Secondary | ICD-10-CM | POA: Diagnosis not present

## 2020-04-06 DIAGNOSIS — M6281 Muscle weakness (generalized): Secondary | ICD-10-CM | POA: Diagnosis not present

## 2020-04-06 DIAGNOSIS — R29898 Other symptoms and signs involving the musculoskeletal system: Secondary | ICD-10-CM | POA: Diagnosis not present

## 2020-04-06 DIAGNOSIS — R41841 Cognitive communication deficit: Secondary | ICD-10-CM | POA: Diagnosis not present

## 2020-04-07 DIAGNOSIS — R41841 Cognitive communication deficit: Secondary | ICD-10-CM | POA: Diagnosis not present

## 2020-04-07 DIAGNOSIS — M6281 Muscle weakness (generalized): Secondary | ICD-10-CM | POA: Diagnosis not present

## 2020-04-07 DIAGNOSIS — R29898 Other symptoms and signs involving the musculoskeletal system: Secondary | ICD-10-CM | POA: Diagnosis not present

## 2020-04-07 DIAGNOSIS — I214 Non-ST elevation (NSTEMI) myocardial infarction: Secondary | ICD-10-CM | POA: Diagnosis not present

## 2020-04-07 DIAGNOSIS — R2681 Unsteadiness on feet: Secondary | ICD-10-CM | POA: Diagnosis not present

## 2020-04-08 DIAGNOSIS — R2681 Unsteadiness on feet: Secondary | ICD-10-CM | POA: Diagnosis not present

## 2020-04-08 DIAGNOSIS — M6281 Muscle weakness (generalized): Secondary | ICD-10-CM | POA: Diagnosis not present

## 2020-04-08 DIAGNOSIS — I214 Non-ST elevation (NSTEMI) myocardial infarction: Secondary | ICD-10-CM | POA: Diagnosis not present

## 2020-04-08 DIAGNOSIS — R41841 Cognitive communication deficit: Secondary | ICD-10-CM | POA: Diagnosis not present

## 2020-04-08 DIAGNOSIS — R29898 Other symptoms and signs involving the musculoskeletal system: Secondary | ICD-10-CM | POA: Diagnosis not present

## 2020-04-09 DIAGNOSIS — R41841 Cognitive communication deficit: Secondary | ICD-10-CM | POA: Diagnosis not present

## 2020-04-09 DIAGNOSIS — R29898 Other symptoms and signs involving the musculoskeletal system: Secondary | ICD-10-CM | POA: Diagnosis not present

## 2020-04-09 DIAGNOSIS — I214 Non-ST elevation (NSTEMI) myocardial infarction: Secondary | ICD-10-CM | POA: Diagnosis not present

## 2020-04-09 DIAGNOSIS — M6281 Muscle weakness (generalized): Secondary | ICD-10-CM | POA: Diagnosis not present

## 2020-04-09 DIAGNOSIS — R2681 Unsteadiness on feet: Secondary | ICD-10-CM | POA: Diagnosis not present

## 2020-04-10 DIAGNOSIS — R2681 Unsteadiness on feet: Secondary | ICD-10-CM | POA: Diagnosis not present

## 2020-04-10 DIAGNOSIS — R41841 Cognitive communication deficit: Secondary | ICD-10-CM | POA: Diagnosis not present

## 2020-04-10 DIAGNOSIS — M6281 Muscle weakness (generalized): Secondary | ICD-10-CM | POA: Diagnosis not present

## 2020-04-10 DIAGNOSIS — R29898 Other symptoms and signs involving the musculoskeletal system: Secondary | ICD-10-CM | POA: Diagnosis not present

## 2020-04-10 DIAGNOSIS — I214 Non-ST elevation (NSTEMI) myocardial infarction: Secondary | ICD-10-CM | POA: Diagnosis not present

## 2020-04-21 DIAGNOSIS — Z23 Encounter for immunization: Secondary | ICD-10-CM | POA: Diagnosis not present

## 2020-04-22 ENCOUNTER — Non-Acute Institutional Stay: Payer: Medicare Other | Admitting: Nurse Practitioner

## 2020-04-22 ENCOUNTER — Encounter: Payer: Self-pay | Admitting: Nurse Practitioner

## 2020-04-22 DIAGNOSIS — I2510 Atherosclerotic heart disease of native coronary artery without angina pectoris: Secondary | ICD-10-CM | POA: Diagnosis not present

## 2020-04-22 DIAGNOSIS — R269 Unspecified abnormalities of gait and mobility: Secondary | ICD-10-CM

## 2020-04-22 DIAGNOSIS — G609 Hereditary and idiopathic neuropathy, unspecified: Secondary | ICD-10-CM | POA: Diagnosis not present

## 2020-04-22 DIAGNOSIS — I10 Essential (primary) hypertension: Secondary | ICD-10-CM | POA: Diagnosis not present

## 2020-04-22 DIAGNOSIS — K219 Gastro-esophageal reflux disease without esophagitis: Secondary | ICD-10-CM | POA: Diagnosis not present

## 2020-04-22 DIAGNOSIS — N1832 Chronic kidney disease, stage 3b: Secondary | ICD-10-CM

## 2020-04-22 DIAGNOSIS — F0280 Dementia in other diseases classified elsewhere without behavioral disturbance: Secondary | ICD-10-CM | POA: Diagnosis not present

## 2020-04-22 DIAGNOSIS — G300 Alzheimer's disease with early onset: Secondary | ICD-10-CM | POA: Diagnosis not present

## 2020-04-22 DIAGNOSIS — F339 Major depressive disorder, recurrent, unspecified: Secondary | ICD-10-CM | POA: Diagnosis not present

## 2020-04-22 DIAGNOSIS — W19XXXA Unspecified fall, initial encounter: Secondary | ICD-10-CM | POA: Diagnosis not present

## 2020-04-22 NOTE — Assessment & Plan Note (Addendum)
CAD, NSTEMI stent f/u cardiology. Hospital stay 03/11/20-03/16/20 for NSTEMI, troponin 300s at ED, Cath showed LAD stenosis, underwent drug eluting stent, EF 55-60%, takes Atorvastatin, ASA, Plavix, Metoprolol.   AI, EF 55-60%

## 2020-04-22 NOTE — Progress Notes (Signed)
Location:   [facility] Room Number: 053-Z Place of Service:  ALF (13) Provider: Lennie Odor Bess Saltzman NP  Unk Pinto, MD  Patient Care Team: Unk Pinto, MD as PCP - General (Internal Medicine) Elouise Munroe, MD as PCP - Cardiology (Cardiology)  Extended Emergency Contact Information Primary Emergency Contact: Humphrey,Ryan Address: [address]          [place], [place] [address] Ryan Humphrey of [place] Phone: [phone] Mobile Phone: [phone] Relation: Daughter Secondary Emergency Contact: Ryan Humphrey, [place] of [place] Phone: [phone] Relation: Daughter  Code Status: DNR Goals of care: Advanced Directive information Advanced Directives 03/18/2020  Does Patient Have a Medical Advance Directive? Yes  Type of Paramedic of [place];Living will  Does patient want to make changes to medical advance directive? No - Patient declined  Copy of [place] in Chart? No - copy requested  Would patient like information on creating a medical advance directive? -     Chief Complaint  Patient presents with   Acute Visit    Patient is seen for a fall    HPI:  Pt is a 86 y.o. male seen today for an acute visit for the patient self reported the fall when the patient got out of bed, slipped, no apparent.    CAD, NSTEMI stent f/u cardiology. Hospital stay 03/11/20-03/16/20 for NSTEMI, troponin 300s at ED, Cath showed LAD stenosis, underwent drug eluting stent, EF 55-60%, takes Atorvastatin, ASA, Plavix, Metoprolol.              HTN, takes Metoprolol.              GERD, takes Pantoprazole, Hgb 10.3 03/20/20             AI, EF 55-60%             CKD stage 3, Bun/creat 25/1.9 03/20/20             Alzheimer's dementia, didn't tolerate memory meds, underwent neurology eval in the past.              Peripheral neuropathy, takes Gabapentin             Depression takes Sertraline   Past  Medical History:  Diagnosis Date   Anemia    Arthritis    arthritis,osteopenia,"spinal stenosis"   Bladder neck obstruction 03/28/2011   CAD (coronary artery disease)    Cancer ([place]) 12-06-12   Prostate cancer'98   CKD (chronic kidney disease) stage 3, GFR 30-59 ml/min ([place]) 08/17/2017   Depression    Essential hypertension, benign 04/10/2009   GERD (gastroesophageal reflux disease)    GERD without esophagitis 11/05/2016   H/O hiatal hernia    H/O vertigo 12-06-12   none recent   Hyperlipidemia    IBS (irritable bowel syndrome)    Mixed hyperlipidemia 04/10/2009   Neuropathy    Osteopenia    Peripheral neuropathy 12-06-12   peripheral neuropathy   Rhinitis    RLS (restless legs syndrome)    Vascular dementia ([place]) 09/30/2019   Vitamin B12 deficiency    Past Surgical History:  Procedure Laterality Date   APPENDECTOMY     CARPAL TUNNEL RELEASE     both hands   cataract surgery Left 12-06-12   recent surgery   CHOLECYSTECTOMY     COLONOSCOPY WITH PROPOFOL N/A 12/25/2012   Procedure: COLONOSCOPY WITH PROPOFOL;  Surgeon: Ursula Alert  Wynetta Emery, MD;  Location: [facility];  Service: Endoscopy;  Laterality: N/A;   CORONARY STENT INTERVENTION N/A 03/13/2020   Procedure: CORONARY STENT INTERVENTION;  Surgeon: Nelva Bush, MD;  Location: [facility];  Service: Cardiovascular;  Laterality: N/A;   ESOPHAGOGASTRODUODENOSCOPY (EGD) WITH PROPOFOL N/A 12/25/2012   Procedure: ESOPHAGOGASTRODUODENOSCOPY (EGD) WITH PROPOFOL;  Surgeon: Garlan Fair, MD;  Location: [facility];  Service: Endoscopy;  Laterality: N/A;   INTRAVASCULAR ULTRASOUND/IVUS N/A 03/13/2020   Procedure: Intravascular Ultrasound/IVUS;  Surgeon: Nelva Bush, MD;  Location: [facility];  Service: Cardiovascular;  Laterality: N/A;   LEFT HEART CATH AND CORONARY ANGIOGRAPHY N/A 03/13/2020   Procedure: LEFT HEART CATH AND CORONARY ANGIOGRAPHY;  Surgeon: Nelva Bush, MD;   Location: [facility];  Service: Cardiovascular;  Laterality: N/A;   PROSTATE SURGERY  12-06-12   TONSILLECTOMY     TOTAL KNEE ARTHROPLASTY Right 11/25/2014   Procedure: RIGHT TOTAL KNEE ARTHROPLASTY;  Surgeon: Melrose Nakayama, MD;  Location: [facility];  Service: Orthopedics;  Laterality: Right;    Allergies  Allergen Reactions   Niacin Itching   Penicillins Other (See Comments)    "rash"    Tramadol Itching    Allergies as of 04/22/2020      Reactions   Niacin Itching   Penicillins Other (See Comments)   "rash"   Tramadol Itching      Medication List       Accurate as of April 22, 2020 11:59 PM. If you have any questions, ask your nurse or doctor.        aspirin EC 81 MG tablet Take 81 mg by mouth daily. Swallow whole. What changed: Another medication with the same name was removed. Continue taking this medication, and follow the directions you see here. Changed by: Meryem Haertel X Ramon Zanders, NP   atorvastatin 80 MG tablet Commonly known as: LIPITOR Take 1 tablet (80 mg total) by mouth daily.   clopidogrel 75 MG tablet Commonly known as: PLAVIX Take 1 tablet (75 mg total) by mouth daily with breakfast.   gabapentin 400 MG capsule Commonly known as: NEURONTIN Take 1 capsule 3 times a day for nerve pain.   metoprolol tartrate 25 MG tablet Commonly known as: LOPRESSOR Take 1 tablet (25 mg total) by mouth 2 (two) times daily.   nitroGLYCERIN 0.4 MG SL tablet Commonly known as: NITROSTAT Place 1 tablet (0.4 mg total) under the tongue every 5 (five) minutes as needed for chest pain.   pantoprazole 40 MG tablet Commonly known as: PROTONIX Take 1 tablet (40 mg total) by mouth daily.   sertraline 100 MG tablet Commonly known as: ZOLOFT 1 tablet Daily for Mood   Vitamin D 50 MCG (2000 UT) tablet Take 1 pill daily.       Review of Systems  Constitutional: Negative for activity change, appetite change and fever.  HENT: Positive for hearing loss. Negative for  congestion and voice change.   Eyes: Negative for visual disturbance.  Respiratory: Negative for cough and shortness of breath.   Cardiovascular: Negative for chest pain, palpitations and leg swelling.  Gastrointestinal: Negative for abdominal pain, constipation, nausea and vomiting.  Genitourinary: Negative for difficulty urinating, dysuria, frequency and urgency.  Musculoskeletal: Positive for gait problem.  Skin:       Bruises.   Neurological: Negative for dizziness, speech difficulty, weakness and headaches.       Memory lapses.   Psychiatric/Behavioral: Negative for agitation, behavioral problems, hallucinations and sleep disturbance. The patient is not  nervous/anxious.     Immunization History  Administered Date(s) Administered   Influenza, High Dose Seasonal PF 03/05/2015, 03/05/2018   Influenza-Unspecified 04/22/2016, 03/13/2017, 06/13/2018   Pneumococcal Conjugate-13 05/23/2017   Pneumococcal Polysaccharide-23 01/20/2016   Pertinent  Health Maintenance Due  Topic Date Due   INFLUENZA VACCINE  01/12/2020   PNA vac Low Risk Adult  Completed   Fall Risk  02/26/2020 09/09/2019 02/14/2019 07/26/2018 12/03/2017  Falls in the past year? 0 0 0 0 No  Number falls in past yr: 0 - 0 - -  Injury with Fall? 0 - 0 - -  Risk Factor Category  - - - - -  Risk for fall due to : No Fall Risks No Fall Risks - - -  Follow up Falls evaluation completed;Falls prevention discussed Falls prevention discussed;Education provided;Falls evaluation completed - - -   Functional Status Survey:    Vitals:   04/22/20 1458  BP: 140/70  Pulse: 70  Resp: 20  Temp: 97.7 F (36.5 C)  TempSrc: Oral  Weight: 167 lb 3.2 oz (75.8 kg)  Height: 6' (1.829 m)   Body mass index is 22.68 kg/m. Physical Exam Vitals and nursing note reviewed.  Constitutional:      Appearance: Normal appearance.  HENT:     Head: Normocephalic and atraumatic.     Nose: Nose normal.     Mouth/Throat:     Mouth: Mucous  membranes are moist.  Eyes:     Extraocular Movements: Extraocular movements intact.     Conjunctiva/sclera: Conjunctivae normal.     Pupils: Pupils are equal, round, and reactive to light.  Cardiovascular:     Rate and Rhythm: Normal rate and regular rhythm.     Heart sounds: No murmur heard.   Pulmonary:     Effort: Pulmonary effort is normal.     Breath sounds: No wheezing.  Abdominal:     General: Bowel sounds are normal.     Palpations: Abdomen is soft.     Tenderness: There is no abdominal tenderness.  Musculoskeletal:     Cervical back: Normal range of motion and neck supple.     Right lower leg: No edema.     Left lower leg: No edema.     Comments: No spinal spinous process tenderness or right mid back abrasion area pain noted.   Skin:    General: Skin is warm and dry.     Findings: Bruising present.     Comments: Ecchymoses BLE. Abrasions right mid back from the fall  Neurological:     General: No focal deficit present.     Mental Status: He is alert. Mental status is at baseline.     Motor: No weakness.     Gait: Gait abnormal.     Comments: Oriented to person, place  Psychiatric:        Mood and Affect: Mood normal.        Behavior: Behavior normal.     Labs reviewed: Recent Labs    09/10/19 1524 09/30/19 0209 02/26/20 1620 03/11/20 2307 03/14/20 0059 03/14/20 0059 03/15/20 0102 03/16/20 0120 03/20/20 0000  NA 142   < > 143   < > 139   < > 138 139 143  K 4.3   < > 5.0   < > 3.7   < > 4.0 4.5 4.1  CL 105   < > 106   < > 109   < > 107 108 110*  CO2 30   < >  30   < > 21*   < > 23 24 23*  GLUCOSE 92   < > 113*   < > 96  --  113* 106*  --   BUN 26*   < > 29*   < > 18   < > 21 24* 25*  CREATININE 1.94*   < > 1.84*   < > 1.52*   < > 1.88* 2.08* 1.9*  CALCIUM 9.0   < > 9.6   < > 8.2*   < > 8.4* 8.6* 8.3*  MG 2.1  --  2.3  --   --   --   --   --   --    < > = values in this interval not displayed.   Recent Labs    09/30/19 0209 09/30/19 0209  11/12/19 1536 02/26/20 1620 03/20/20 0000  AST 15   < > 11 13 13*  ALT 11   < > 7* 8* 11  ALKPHOS 53  --   --   --  53  BILITOT 0.3  --  0.5 0.6  --   PROT 5.7*  --  6.2 6.6  --   ALBUMIN 3.4*  --   --   --  3.4*   < > = values in this interval not displayed.   Recent Labs    11/12/19 1536 11/12/19 1536 02/26/20 1620 03/11/20 2307 03/13/20 0045 03/13/20 0045 03/14/20 0059 03/15/20 0102 03/20/20 0000  WBC 6.6   < > 8.4   < > 6.5   < > 8.3 7.5 6.4  NEUTROABS 5,029  --  6,275  --   --   --   --   --  4,864.00  HGB 11.4*   < > 13.0*   < > 11.5*   < > 11.0* 10.5* 10.3*  HCT 33.6*   < > 38.4*   < > 33.7*   < > 32.6* 30.5* 30*  MCV 98.0   < > 97.0   < > 97.4  --  96.7 97.1  --   PLT 123*   < > 145   < > 112*   < > 116* 103* 99*   < > = values in this interval not displayed.   Lab Results  Component Value Date   TSH 3.715 03/12/2020   Lab Results  Component Value Date   HGBA1C 5.1 09/10/2019   Lab Results  Component Value Date   CHOL 171 02/26/2020   HDL 41 02/26/2020   LDLCALC 104 (H) 02/26/2020   TRIG 151 (H) 02/26/2020   CHOLHDL 4.2 02/26/2020    Significant Diagnostic Results in last 30 days:  No results found.  Assessment/Plan: Fall  the patient self reported the fall when the patient got out of bed, slipped, no apparent. Will provide safety awareness eduction, close supervision for safety. Needs bed rails and lift chair to facilitate mobility and safety.   CORONARY ATHEROSCLEROSIS NATIVE CORONARY ARTERY CAD, NSTEMI stent f/u cardiology. Hospital stay 03/11/20-03/16/20 for NSTEMI, troponin 300s at ED, Cath showed LAD stenosis, underwent drug eluting stent, EF 55-60%, takes Atorvastatin, ASA, Plavix, Metoprolol.   AI, EF 55-60%   Essential hypertension, benign  HTN, takes Metoprolol.   GERD without esophagitis GERD, takes Pantoprazole, Hgb 10.3 03/20/20   CKD (chronic kidney disease) stage 3, GFR 30-59 ml/min (H[facility]) CKD stage 3, Bun/creat 25/1.9 03/20/20                Alzheimer's dementia without behavioral  disturbance ([facility]) Alzheimer's dementia, didn't tolerate memory meds, underwent neurology eval in the past. Needs close supervision for safety   Hereditary and idiopathic peripheral neuropathy Peripheral neuropathy, takes Gabapentin   Depression, recurrent ([facility])  Depression takes Sertraline   Gait abnormality Needs to use walker all the time for ambulation.     Family/ staff Communication: plan of care reviewed with the patient and charge nurse.   Labs/tests ordered:  None  Time spend 40 minutes.

## 2020-04-22 NOTE — Assessment & Plan Note (Signed)
HTN, takes Metoprolol.   

## 2020-04-22 NOTE — Assessment & Plan Note (Signed)
CKD stage 3, Bun/creat 25/1.9 03/20/20

## 2020-04-22 NOTE — Assessment & Plan Note (Signed)
GERD, takes Pantoprazole, Hgb 10.3 03/20/20

## 2020-04-22 NOTE — Assessment & Plan Note (Addendum)
the patient self reported the fall when the patient got out of bed, slipped, no apparent. Will provide safety awareness eduction, close supervision for safety. Needs bed rails and lift chair to facilitate mobility and safety.

## 2020-04-22 NOTE — Assessment & Plan Note (Signed)
Peripheral neuropathy, takes Gabapentin

## 2020-04-22 NOTE — Assessment & Plan Note (Signed)
Needs to use walker all the time for ambulation.

## 2020-04-22 NOTE — Assessment & Plan Note (Signed)
Depression takes Sertraline

## 2020-04-22 NOTE — Assessment & Plan Note (Addendum)
Alzheimer's dementia, didn't tolerate memory meds, underwent neurology eval in the past. Needs close supervision for safety

## 2020-04-23 ENCOUNTER — Encounter: Payer: Self-pay | Admitting: Nurse Practitioner

## 2020-05-01 ENCOUNTER — Encounter (HOSPITAL_COMMUNITY): Payer: Self-pay

## 2020-05-01 ENCOUNTER — Emergency Department (HOSPITAL_COMMUNITY)
Admission: EM | Admit: 2020-05-01 | Discharge: 2020-05-02 | Disposition: A | Discharge status: Home / Self Care | Payer: Medicare Other | Attending: Emergency Medicine | Admitting: Emergency Medicine

## 2020-05-01 ENCOUNTER — Other Ambulatory Visit: Payer: Self-pay

## 2020-05-01 ENCOUNTER — Emergency Department (HOSPITAL_COMMUNITY): Payer: Medicare Other

## 2020-05-01 DIAGNOSIS — I2510 Atherosclerotic heart disease of native coronary artery without angina pectoris: Secondary | ICD-10-CM | POA: Diagnosis not present

## 2020-05-01 DIAGNOSIS — I129 Hypertensive chronic kidney disease with stage 1 through stage 4 chronic kidney disease, or unspecified chronic kidney disease: Secondary | ICD-10-CM | POA: Diagnosis not present

## 2020-05-01 DIAGNOSIS — Z87891 Personal history of nicotine dependence: Secondary | ICD-10-CM | POA: Insufficient documentation

## 2020-05-01 DIAGNOSIS — Z7902 Long term (current) use of antithrombotics/antiplatelets: Secondary | ICD-10-CM | POA: Insufficient documentation

## 2020-05-01 DIAGNOSIS — R0902 Hypoxemia: Secondary | ICD-10-CM | POA: Diagnosis not present

## 2020-05-01 DIAGNOSIS — I959 Hypotension, unspecified: Secondary | ICD-10-CM | POA: Diagnosis not present

## 2020-05-01 DIAGNOSIS — Z8546 Personal history of malignant neoplasm of prostate: Secondary | ICD-10-CM | POA: Diagnosis not present

## 2020-05-01 DIAGNOSIS — Z7982 Long term (current) use of aspirin: Secondary | ICD-10-CM | POA: Diagnosis not present

## 2020-05-01 DIAGNOSIS — R079 Chest pain, unspecified: Secondary | ICD-10-CM | POA: Diagnosis not present

## 2020-05-01 DIAGNOSIS — Z79899 Other long term (current) drug therapy: Secondary | ICD-10-CM | POA: Insufficient documentation

## 2020-05-01 DIAGNOSIS — Z96651 Presence of right artificial knee joint: Secondary | ICD-10-CM | POA: Insufficient documentation

## 2020-05-01 DIAGNOSIS — F0150 Vascular dementia without behavioral disturbance: Secondary | ICD-10-CM | POA: Insufficient documentation

## 2020-05-01 DIAGNOSIS — R0789 Other chest pain: Secondary | ICD-10-CM

## 2020-05-01 DIAGNOSIS — N183 Chronic kidney disease, stage 3 unspecified: Secondary | ICD-10-CM | POA: Diagnosis not present

## 2020-05-01 LAB — CBC
HCT: 27.8 % — ABNORMAL LOW (ref 39.0–52.0)
Hemoglobin: 8.8 g/dL — ABNORMAL LOW (ref 13.0–17.0)
MCH: 32.2 pg (ref 26.0–34.0)
MCHC: 31.7 g/dL (ref 30.0–36.0)
MCV: 101.8 fL — ABNORMAL HIGH (ref 80.0–100.0)
Platelets: 159 10*3/uL (ref 150–400)
RBC: 2.73 MIL/uL — ABNORMAL LOW (ref 4.22–5.81)
RDW: 12.5 % (ref 11.5–15.5)
WBC: 8.3 10*3/uL (ref 4.0–10.5)
nRBC: 0 % (ref 0.0–0.2)

## 2020-05-01 LAB — BASIC METABOLIC PANEL
Anion gap: 9 (ref 5–15)
BUN: 32 mg/dL — ABNORMAL HIGH (ref 8–23)
CO2: 21 mmol/L — ABNORMAL LOW (ref 22–32)
Calcium: 8.5 mg/dL — ABNORMAL LOW (ref 8.9–10.3)
Chloride: 111 mmol/L (ref 98–111)
Creatinine, Ser: 1.82 mg/dL — ABNORMAL HIGH (ref 0.61–1.24)
GFR, Estimated: 35 mL/min — ABNORMAL LOW (ref >60)
Glucose, Bld: 114 mg/dL — ABNORMAL HIGH (ref 70–99)
Potassium: 4.9 mmol/L (ref 3.5–5.1)
Sodium: 141 mmol/L (ref 135–145)

## 2020-05-01 LAB — TROPONIN I (HIGH SENSITIVITY): Troponin I (High Sensitivity): 4 ng/L (ref <18)

## 2020-05-01 NOTE — ED Provider Notes (Signed)
[facility] EMERGENCY DEPARTMENT Provider Note   CSN: [id number] Arrival date & time: 05/01/20  2205     History Chief Complaint  Patient presents with  . Chest Pain    Ryan Humphrey is a 86 y.o. male with past medical history significant for anemia, nonobstructive CAD, CKD stage III, hypertension, GERD, IBS, vascular dementia.  Anticoagulated on Plavix.    HPI Patient presents to emergency department today via EMS with chief complaint of sudden onset of chest pain just prior to arrival.  He was sitting in chair when the pain started.  He describes the pain as feeling like it was behind both nipples.  He did not think it radiated across his chest, to his back, arms or jaw.  He rated the pain 8 out of 10 in severity.  He describes the pain as a pressure sensation.  Patient was given 324 mg of aspirin and nitro 0.4 mg sublingual x3 by nursing home staff.  Patient states after taking the nitro his chest pain resolved.  He states it felt like the pain he had when he had his heart attack in September.  He denies being any pain currently.  Denies any fever, chills, cough, congestion, shortness of breath, abdominal pain, nausea, emesis, urinary symptoms.  He states his last bowel movement was yesterday.  He did not look to see if there was any blood or if it was black and tarry.  Chart review shows echo on 03/12/2020 with LVEF of 55 to 60% and grade 2 diastolic dysfunction.  Had cath 03/13/2020 with severe single-vessel LAD treated with PCI.   Past Medical History:  Diagnosis Date  . Anemia   . Arthritis    arthritis,osteopenia,"spinal stenosis"  . Bladder neck obstruction 03/28/2011  . CAD (coronary artery disease)   . Cancer [facility]) 12-06-12   Prostate cancer'98  . CKD (chronic kidney disease) stage 3, GFR 30-59 ml/min (HCC) 08/17/2017  . Depression   . Essential hypertension, benign 04/10/2009  . GERD (gastroesophageal reflux disease)   . GERD without esophagitis 11/05/2016   . H/O hiatal hernia   . H/O vertigo 12-06-12   none recent  . Hyperlipidemia   . IBS (irritable bowel syndrome)   . Mixed hyperlipidemia 04/10/2009  . Neuropathy   . Osteopenia   . Peripheral neuropathy 12-06-12   peripheral neuropathy  . Rhinitis   . RLS (restless legs syndrome)   . Vascular dementia ([place]) 09/30/2019  . Vitamin B12 deficiency     Patient Active Problem List   Diagnosis Date Noted  . Gait abnormality 04/22/2020  . Fall 03/18/2020  . Depression, recurrent ([place]) 03/18/2020  . NSTEMI (non-ST elevated myocardial infarction) ([place]) 03/12/2020  . Alzheimer's dementia without behavioral disturbance ([place]) 09/30/2019  . CKD (chronic kidney disease) stage 3, GFR 30-59 ml/min (HCC) 08/17/2017  . History of TIA (transient ischemic attack) 11/05/2016  . Vitamin B12 deficiency 11/05/2016  . GERD without esophagitis 11/05/2016  . Hereditary and idiopathic peripheral neuropathy 08/20/2015  . Benign essential tremor 08/20/2015  . Vitamin D deficiency 01/06/2015  . Medication management 01/06/2015  . Primary osteoarthritis of right knee 11/25/2014  . Malignant neoplasm of prostate ([place]) 03/28/2011  . INTERMITTENT VERTIGO 10/12/2009  . Mixed hyperlipidemia 04/10/2009  . Essential hypertension, benign 04/10/2009  . CORONARY ATHEROSCLEROSIS NATIVE CORONARY ARTERY 04/10/2009    Past Surgical History:  Procedure Laterality Date  . APPENDECTOMY    . CARPAL TUNNEL RELEASE     both hands  . cataract  surgery Left 12-06-12   recent surgery  . CHOLECYSTECTOMY    . COLONOSCOPY WITH PROPOFOL N/A 12/25/2012   Procedure: COLONOSCOPY WITH PROPOFOL;  Surgeon: Garlan Fair, MD;  Location: [facility] END[facility]COPY;  Service: Endoscopy;  Laterality: N/A;  . CORONARY STENT INTERVENTION N/A 03/13/2020   Procedure: CORONARY STENT INTERVENTION;  Surgeon: Nelva Bush, MD;  Location: [place] CV LAB;  Service: Cardiovascular;  Laterality: N/A;  . ESOPHAGOGASTRODUODENOSCOPY (EGD) WITH PROPOFOL N/A  12/25/2012   Procedure: ESOPHAGOGASTRODUODENOSCOPY (EGD) WITH PROPOFOL;  Surgeon: Garlan Fair, MD;  Location: [facility] ENDOS[facility];  Service: Endoscopy;  Laterality: N/A;  . INTRAVASCULAR ULTRASOUND/IVUS N/A 03/13/2020   Procedure: Intravascular Ultrasound/IVUS;  Surgeon: Nelva Bush, MD;  Location: [place] CV LAB;  Service: Cardiovascular;  Laterality: N/A;  . LEFT HEART CATH AND CORONARY ANGIOGRAPHY N/A 03/13/2020   Procedure: LEFT HEART CATH AND CORONARY ANGIOGRAPHY;  Surgeon: Nelva Bush, MD;  Location: [place] CV LAB;  Service: Cardiovascular;  Laterality: N/A;  . PROSTATE SURGERY  12-06-12  . TONSILLECTOMY    . TOTAL KNEE ARTHROPLASTY Right 11/25/2014   Procedure: RIGHT TOTAL KNEE ARTHROPLASTY;  Surgeon: Melrose Nakayama, MD;  Location: [place];  Service: Orthopedics;  Laterality: Right;       Family History  Problem Relation Age of Onset  . CAD Father        Deceased, 63  . Dementia Mother        Deceased, 14  . Diabetes Brother   . Hyperlipidemia Brother   . Hypertension Brother   . Dementia Brother   . Colon cancer Neg Hx   . Colon polyps Neg Hx   . Kidney disease Neg Hx   . Esophageal cancer Neg Hx   . Gallbladder disease Neg Hx     Social History   Tobacco Use  . Smoking status: Former Smoker    Packs/day: 0.50    Years: 30.00    Pack years: 15.00    Quit date: 06/14/1963    Years since quitting: 56.9  . Smokeless tobacco: Never Used  Vaping Use  . Vaping Use: Never used  Substance Use Topics  . Alcohol use: Yes    Alcohol/week: 0.0 standard drinks    Comment: Socially  . Drug use: No    Home Medications Prior to Admission medications   Medication Sig Start Date End Date Taking? Authorizing Provider  aspirin EC 81 MG tablet Take 81 mg by mouth daily. Swallow whole.   Yes [provider]  atorvastatin (LIPITOR) 80 MG tablet Take 1 tablet (80 mg total) by mouth daily. 03/17/20  Yes Bhagat, Bhavinkumar, PA  Cholecalciferol (VITAMIN D) 50 MCG  (2000 UT) tablet Take 1 pill daily. 01/10/19  Yes Unk Pinto, MD  clopidogrel (PLAVIX) 75 MG tablet Take 1 tablet (75 mg total) by mouth daily with breakfast. 03/17/20  Yes Bhagat, Bhavinkumar, PA  gabapentin (NEURONTIN) 400 MG capsule Take 1 capsule 3 times a day for nerve pain. Patient taking differently: Take 400 mg by mouth 3 (three) times daily. Take 1 capsule 3 times a day for nerve pain. 09/10/19  Yes Unk Pinto, MD  metoprolol tartrate (LOPRESSOR) 25 MG tablet Take 1 tablet (25 mg total) by mouth 2 (two) times daily. 03/16/20  Yes Bhagat, Bhavinkumar, PA  nitroGLYCERIN (NITROSTAT) 0.4 MG SL tablet Place 1 tablet (0.4 mg total) under the tongue every 5 (five) minutes as needed for chest pain. 03/16/20  Yes Bhagat, Bhavinkumar, PA  pantoprazole (PROTONIX) 40 MG tablet Take 1 tablet (  40 mg total) by mouth daily. 03/17/20  Yes Bhagat, Crista Luria, PA  sertraline (ZOLOFT) 100 MG tablet 1 tablet Daily for Mood 09/10/19 03/30/30 Yes Unk Pinto, MD    Allergies    Niacin, Penicillins, and Tramadol  Review of Systems   Review of Systems All other systems are reviewed and are negative for acute change except as noted in the HPI.  Physical Exam Updated Vital Signs BP (!) 108/51   Pulse 62   Temp 98.2 F (36.8 C) (Oral)   Resp 10   Ht 6' (1.829 m)   Wt 77.6 kg   SpO2 100%   BMI 23.19 kg/m   Physical Exam Vitals and nursing note reviewed.  Constitutional:      General: He is not in acute distress.    Appearance: He is not ill-appearing.  HENT:     Head: Normocephalic and atraumatic.     Right Ear: Tympanic membrane and external ear normal.     Left Ear: Tympanic membrane and external ear normal.     Nose: Nose normal.     Mouth/Throat:     Mouth: Mucous membranes are moist.     Pharynx: Oropharynx is clear.  Eyes:     General: No scleral icterus.       Right eye: No discharge.        Left eye: No discharge.     Extraocular Movements: Extraocular movements intact.       Conjunctiva/sclera: Conjunctivae normal.     Pupils: Pupils are equal, round, and reactive to light.  Neck:     Vascular: No JVD.  Cardiovascular:     Rate and Rhythm: Normal rate and regular rhythm.     Pulses: Normal pulses.          Radial pulses are 2+ on the right side and 2+ on the left side.     Heart sounds: Normal heart sounds. No murmur heard.   Pulmonary:     Comments: Lungs clear to auscultation in all fields. Symmetric chest rise. No wheezing, rales, or rhonchi. Chest:     Chest wall: No tenderness.  Abdominal:     Comments: Abdomen is soft, non-distended, and non-tender in all quadrants. No rigidity, no guarding. No peritoneal signs.  Musculoskeletal:        General: Normal range of motion.     Cervical back: Normal range of motion.     Right lower leg: No edema.     Left lower leg: No edema.  Skin:    General: Skin is warm and dry.     Capillary Refill: Capillary refill takes less than 2 seconds.  Neurological:     Mental Status: He is oriented to person, place, and time.     GCS: GCS eye subscore is 4. GCS verbal subscore is 5. GCS motor subscore is 6.     Comments: Fluent speech, no facial droop.  Psychiatric:        Behavior: Behavior normal.     ED Results / Procedures / Treatments   Labs (all labs ordered are listed, but only abnormal results are displayed) Labs Reviewed  BASIC METABOLIC PANEL - Abnormal; Notable for the following components:      Result Value   CO2 21 (*)    Glucose, Bld 114 (*)    BUN 32 (*)    Creatinine, Ser 1.82 (*)    Calcium 8.5 (*)    GFR, Estimated 35 (*)    All other components within  normal limits  CBC - Abnormal; Notable for the following components:   RBC 2.73 (*)    Hemoglobin 8.8 (*)    HCT 27.8 (*)    MCV 101.8 (*)    All other components within normal limits  POC OCCULT BLOOD, ED  TROPONIN I (HIGH SENSITIVITY)  TROPONIN I (HIGH SENSITIVITY)    EKG EKG Interpretation  Date/Time:  Friday May 01 2020 22:07:24 EST Ventricular Rate:  64 PR Interval:    QRS Duration: 141 QT Interval:  485 QTC Calculation: 501 R Axis:   27 Text Interpretation: Sinus rhythm Right bundle branch block improved St/t changes from 10/3 Confirmed by Merrily Pew [phone]) on 05/01/2020 11:50:04 PM   Radiology DG Chest 2 View  Result Date: 05/01/2020 CLINICAL DATA:  Chest pain. EXAM: CHEST - 2 VIEW COMPARISON:  March 11, 2020 FINDINGS: The heart size and mediastinal contours are within normal limits. There is moderate severity calcification of the thoracic aorta. Both lungs are clear. Degenerative changes are seen throughout the thoracic spine. IMPRESSION: No active cardiopulmonary disease. Electronically Signed   By: Virgina Norfolk M.D.   On: 05/01/2020 23:27    Procedures Procedures (including critical care time)  Medications Ordered in ED Medications  sodium chloride 0.9 % bolus 1,000 mL (has no administration in time range)    ED Course  I have reviewed the triage vital signs and the nursing notes.  Pertinent labs & imaging results that were available during my care of the patient were reviewed by me and considered in my medical decision making (see chart for details).    MDM Rules/Calculators/A&P                          History provided by patient with additional history obtained from chart review.    Patient presenting with an onset of chest pain, recent NSTEMI x2 months ago.  On ED arrival he is afebrile, hemodynamically stable.  He is well-appearing, no acute distress.  Denies being in any pain.  Exam is overall unremarkable, lungs are clear to auscultation all fields, no lower extremity edema. EKG shows no ischemic changes. I viewed pt's chest xray and it does not suggest acute infectious processes  Heart score 4. CBC without leukocytosis, does have hemoglobin of 8.8, appears his baseline appears to be around 10.  It was last checked in our system 1 month ago and it was 10.3. Fecal  occult negative. Delta troponin flat. Repeat EKG with second troponin is still showing sinus rhythm and RBBB, no ischemic changes. BMP without significant electrolyte derangement, creatinine up from baseline 1.3 although consistent with recent x 1 month ago after cath. Given 1L NS.  Pain is not a tearing sensation, symmetric pulses, no widening of mediastinum on CXR, doubt dissection. Cardiac monitor reviewed, no notable arrhythmias or tachycardia. Patient has appeared hemodynamically stable throughout ER visit and appears safe for discharge with close PCP/cardiology follow up. I discussed results, treatment plan, need for PCP follow-up, and return precautions with the patient. Provided opportunity for questions, patient confirmed understanding and is in agreement with plan. Patient will also need to follow up with GI for lower hemoglobin as well as pcp to have creatinine rechecked within 1 week. Findings and plan of care discussed with supervising physician Dr. Dayna Barker.    Portions of this note were generated with [employer]. Dictation errors may occur despite best attempts at proofreading.    Final Clinical Impression(s) /  ED Diagnoses Final diagnoses:  Atypical chest pain    Rx / DC Orders ED Discharge Orders    None       Lewanda Rife 05/02/20 0350    Merrily Pew, MD 05/02/20 [phone]

## 2020-05-02 DIAGNOSIS — R0789 Other chest pain: Secondary | ICD-10-CM | POA: Diagnosis not present

## 2020-05-02 LAB — TROPONIN I (HIGH SENSITIVITY): Troponin I (High Sensitivity): 5 ng/L (ref <18)

## 2020-05-02 LAB — POC OCCULT BLOOD, ED: Fecal Occult Bld: NEGATIVE

## 2020-05-02 MED ORDER — SODIUM CHLORIDE 0.9 % IV BOLUS: 1000.0000 mL | Freq: Once | INTRAVENOUS | Status: DC

## 2020-05-02 NOTE — ED Notes (Signed)
Report given to [place] [facility] [place].

## 2020-05-02 NOTE — ED Notes (Signed)
Pt provided with urinal

## 2020-05-02 NOTE — Discharge Instructions (Addendum)
Read instructions below for reasons to return to the Emergency Department.  -Recommend you follow-up with cardiology about your chest pain. You also need to follow-up with [facility].  Your hemoglobin level today was 8.8.  This is not low enough that would need a transfusion however you should have it rechecked outpatient.  -Have your creatinine (kidney function) rechecked by primary care doctor within 1 week. It was elevated today to 1.82.   Tests performed today include: An EKG of your heart A chest x-ray Cardiac enzymes - a blood test for heart muscle damage Blood counts and electrolytes  Chest Pain (Nonspecific)  HOME CARE INSTRUCTIONS  -For the next few days, avoid physical activities that bring on chest pain. Continue physical activities as directed.  -Follow your caregiver's suggestions for further testing if your chest pain does not go away.  -Keep any follow-up appointments you made. If you do not go to an appointment, you could develop lasting (chronic) problems with pain. If there is any problem keeping an appointment, you must call to reschedule.   SEEK MEDICAL CARE IF:  You think you are having problems from the medicine you are taking. Read your medicine instructions carefully.  Your chest pain does not go away, even after treatment.  You develop a rash with blisters on your chest.   SEEK IMMEDIATE MEDICAL CARE IF:  You have increased chest pain or pain that spreads to your arm, neck, jaw, back, or belly (abdomen).  You develop shortness of breath, an increasing cough, or you are coughing up blood.  You have severe back or abdominal pain, feel sick to your stomach (nauseous) or throw up (vomit).  You develop severe weakness, fainting, or chills.  You have an oral temperature above 102 F (38.9 C), not controlled by medicine.   THIS IS AN EMERGENCY. Do not wait to see if the pain will go away. Get medical help at once. Call 911. Do not drive yourself to the  hospital.

## 2020-05-02 NOTE — ED Notes (Signed)
Ptar called 

## 2020-05-04 ENCOUNTER — Non-Acute Institutional Stay: Payer: Medicare Other | Admitting: Internal Medicine

## 2020-05-04 ENCOUNTER — Encounter: Payer: Self-pay | Admitting: Internal Medicine

## 2020-05-04 DIAGNOSIS — F339 Major depressive disorder, recurrent, unspecified: Secondary | ICD-10-CM

## 2020-05-04 DIAGNOSIS — F0280 Dementia in other diseases classified elsewhere without behavioral disturbance: Secondary | ICD-10-CM | POA: Diagnosis not present

## 2020-05-04 DIAGNOSIS — D649 Anemia, unspecified: Secondary | ICD-10-CM

## 2020-05-04 DIAGNOSIS — N1832 Chronic kidney disease, stage 3b: Secondary | ICD-10-CM

## 2020-05-04 DIAGNOSIS — E785 Hyperlipidemia, unspecified: Secondary | ICD-10-CM | POA: Diagnosis not present

## 2020-05-04 DIAGNOSIS — I10 Essential (primary) hypertension: Secondary | ICD-10-CM | POA: Diagnosis not present

## 2020-05-04 DIAGNOSIS — G300 Alzheimer's disease with early onset: Secondary | ICD-10-CM | POA: Diagnosis not present

## 2020-05-04 DIAGNOSIS — I214 Non-ST elevation (NSTEMI) myocardial infarction: Secondary | ICD-10-CM

## 2020-05-04 DIAGNOSIS — R079 Chest pain, unspecified: Secondary | ICD-10-CM

## 2020-05-04 NOTE — Progress Notes (Signed)
Location:   [place] Room Number: [place] of Service:  ALF [phone]) Provider:  Veleta Miners MD  Virgie Dad, MD  Patient Care Team: Virgie Dad, MD as PCP - General (Internal Medicine) Elouise Munroe, MD as PCP - Cardiology (Cardiology)  Extended Emergency Contact Information Primary Emergency Contact: Weeks,[name] Address: [address]          [place], [place] [address] [name] of [place] Phone: [phone] Mobile Phone: [phone] Relation: Daughter Secondary Emergency Contact: [name], [place] of [place] Mobile Phone: [phone] Relation: Daughter  Code Status:  Full Code Goals of care: Advanced Directive information Advanced Directives 05/01/2020  Does Patient Have a Medical Advance Directive? No  Type of Advance Directive -  Does patient want to make changes to medical advance directive? -  Copy of [place] in Chart? -  Would patient like information on creating a medical advance directive? No - Patient declined     Chief Complaint  Patient presents with  . Acute Visit    Chest pain    HPI:  Pt is a 86 y.o. male seen today for an acute visit for Atypical Chest pain  [facility] transfer to [facility]   Has h/o Hypertension,HLD, h/o CAD, Moderate AI, Chronic CKD stage 3, Alzheimer's Dementia  Was Admitted from 9/29-10/4 for NSTEMI, and Renal Insufficiency, Underwent Stent Placement Dual Eluting in his LAD LVEF was 55-60% with no Wall motion abnormalities but Moderate AI  Was send to ED yesterday due to Substernal chest pain  Not relieved by sublingual nitroglycerin In the ED his EKG had no ischemic changes his chest x-ray was negative his troponins were negative.  But his CBC showed a drop of hemoglobin to 8.8.he was occult negative. He was sent back to assisted living.  Patient had no complaints today he does not remember having chest pain but does remember going  to the emergency room.  He states his memory is so bad he cannot keep up with the events Had no other acute complaints Past Medical History:  Diagnosis Date  . Anemia   . Arthritis    arthritis,osteopenia,"spinal stenosis"  . Bladder neck obstruction 03/28/2011  . CAD (coronary artery disease)   . Cancer [facility]) 12-06-12   Prostate cancer'98  . CKD (chronic kidney disease) stage 3, GFR 30-59 ml/min (HCC) 08/17/2017  . Depression   . Essential hypertension, benign 04/10/2009  . GERD (gastroesophageal reflux disease)   . GERD without esophagitis 11/05/2016  . H/O hiatal hernia   . H/O vertigo 12-06-12   none recent  . Hyperlipidemia   . IBS (irritable bowel syndrome)   . Mixed hyperlipidemia 04/10/2009  . Neuropathy   . Osteopenia   . Peripheral neuropathy 12-06-12   peripheral neuropathy  . Rhinitis   . RLS (restless legs syndrome)   . Vascular dementia ([place]) 09/30/2019  . Vitamin B12 deficiency    Past Surgical History:  Procedure Laterality Date  . APPENDECTOMY    . CARPAL TUNNEL RELEASE     both hands  . cataract surgery Left 12-06-12   recent surgery  . CHOLECYSTECTOMY    . COLONOSCOPY WITH PROPOFOL N/A 12/25/2012   Procedure: COLONOSCOPY WITH PROPOFOL;  Surgeon: Garlan Fair, MD;  [facility];  Service: Endoscopy;  Laterality: N/A;  . CORONARY STENT INTERVENTION N/A 03/13/2020   Procedure: CORONARY STENT INTERVENTION;  Surgeon: Nelva Bush, MD;  Location: [facility] CV LAB;  Service: Cardiovascular;  Laterality: N/A;  . ESOPHAGOGASTRODUODENOSCOPY (EGD) WITH PROPOFOL N/A 12/25/2012   Procedure: ESOPHAGOGASTRODUODENOSCOPY (EGD) WITH PROPOFOL;  Surgeon: Garlan Fair, MD;  [facility];  Service: Endoscopy;  Laterality: N/A;  . INTRAVASCULAR ULTRASOUND/IVUS N/A 03/13/2020   Procedure: Intravascular Ultrasound/IVUS;  Surgeon: Nelva Bush, MD;  Location: [facility] CV LAB;  Service: Cardiovascular;  Laterality: N/A;  . LEFT HEART CATH AND  CORONARY ANGIOGRAPHY N/A 03/13/2020   Procedure: LEFT HEART CATH AND CORONARY ANGIOGRAPHY;  Surgeon: Nelva Bush, MD;  Location: [facility] CV LAB;  Service: Cardiovascular;  Laterality: N/A;  . PROSTATE SURGERY  12-06-12  . TONSILLECTOMY    . TOTAL KNEE ARTHROPLASTY Right 11/25/2014   Procedure: RIGHT TOTAL KNEE ARTHROPLASTY;  Surgeon: Melrose Nakayama, MD;  Location: [facility];  Service: Orthopedics;  Laterality: Right;    Allergies  Allergen Reactions  . Niacin Itching  . Penicillins Other (See Comments)    "rash"   . Tramadol Itching    Allergies as of 05/04/2020      Reactions   Niacin Itching   Penicillins Other (See Comments)   "rash"   Tramadol Itching      Medication List       Accurate as of May 04, 2020 11:06 AM. If you have any questions, ask your nurse or doctor.        aspirin EC 81 MG tablet Take 81 mg by mouth daily. Swallow whole.   atorvastatin 80 MG tablet Commonly known as: LIPITOR Take 1 tablet (80 mg total) by mouth daily.   clopidogrel 75 MG tablet Commonly known as: PLAVIX Take 1 tablet (75 mg total) by mouth daily with breakfast.   gabapentin 400 MG capsule Commonly known as: NEURONTIN Take 1 capsule 3 times a day for nerve pain. What changed:   how much to take  how to take this  when to take this   metoprolol tartrate 25 MG tablet Commonly known as: LOPRESSOR Take 1 tablet (25 mg total) by mouth 2 (two) times daily.   nitroGLYCERIN 0.4 MG SL tablet Commonly known as: NITROSTAT Place 1 tablet (0.4 mg total) under the tongue every 5 (five) minutes as needed for chest pain.   pantoprazole 40 MG tablet Commonly known as: PROTONIX Take 1 tablet (40 mg total) by mouth daily.   sertraline 100 MG tablet Commonly known as: ZOLOFT 1 tablet Daily for Mood   Vitamin D 50 MCG (2000 UT) tablet Take 1 pill daily.       Review of Systems  Review of Systems  Constitutional: Negative for activity change, appetite change, chills,  diaphoresis, fatigue and fever.  HENT: Negative for mouth sores, postnasal drip, rhinorrhea, sinus pain and sore throat.   Respiratory: Negative for apnea, cough, chest tightness, shortness of breath and wheezing.   Cardiovascular: Negative for chest pain, palpitations and leg swelling.  Gastrointestinal: Negative for abdominal distention, abdominal pain, constipation, diarrhea, nausea and vomiting.  Genitourinary: Negative for dysuria and frequency.  Musculoskeletal: Negative for arthralgias, joint swelling and myalgias.  Skin: Negative for rash.  Neurological: Negative for dizziness, syncope, weakness, light-headedness and numbness.  Psychiatric/Behavioral: Negative for behavioral problems, confusion and sleep disturbance.     Immunization History  Administered Date(s) Administered  . Influenza, High Dose Seasonal PF 03/05/2015, 03/05/2018  . Influenza-Unspecified 04/22/2016, 03/13/2017, 06/13/2018  . Pneumococcal Conjugate-13 05/23/2017  . Pneumococcal Polysaccharide-23 01/20/2016   Pertinent  Health Maintenance Due  Topic Date Due  . INFLUENZA  VACCINE  01/12/2020  . PNA vac Low Risk Adult  Completed   Fall Risk  02/26/2020 09/09/2019 02/14/2019 07/26/2018 12/03/2017  Falls in the past year? 0 0 0 0 No  Number falls in past yr: 0 - 0 - -  Injury with Fall? 0 - 0 - -  Risk Factor Category  - - - - -  Risk for fall due to : No Fall Risks No Fall Risks - - -  Follow up Falls evaluation completed;Falls prevention discussed Falls prevention discussed;Education provided;Falls evaluation completed - - -   Functional Status Survey:    Vitals:   05/04/20 1053  BP: 98/62  Pulse: 68  Resp: 16  Temp: 97.6 F (36.4 C)  SpO2: 95%  Weight: 167 lb (75.8 kg)  Height: 6' (1.829 m)   Body mass index is 22.65 kg/m. Physical Exam Constitutional: . Well-developed and well-nourished.  HENT:  Head: Normocephalic.  Mouth/Throat: Oropharynx is clear and moist.  Eyes: Pupils are equal, round,  and reactive to light.  Neck: Neck supple.  Cardiovascular: Normal rate and normal heart sounds.  No murmur heard. Pulmonary/Chest: Effort normal and breath sounds normal. No respiratory distress. No wheezes. She has no rales.  Abdominal: Soft. Bowel sounds are normal. No distension. There is no tenderness. There is no rebound.  Musculoskeletal: No edema.  Lymphadenopathy: none Neurological: No Focal deficits Skin: Skin is warm and dry.  Psychiatric: Normal mood and affect. Behavior is normal. Thought content normal.   Labs reviewed: Recent Labs    09/10/19 1524 09/30/19 0209 02/26/20 1620 03/11/20 2307 03/15/20 0102 03/15/20 0102 03/16/20 0120 03/20/20 0000 05/01/20 2220  NA 142   < > 143   < > 138   < > 139 143 141  K 4.3   < > 5.0   < > 4.0   < > 4.5 4.1 4.9  CL 105   < > 106   < > 107   < > 108 110* 111  CO2 30   < > 30   < > 23   < > 24 23* 21*  GLUCOSE 92   < > 113*   < > 113*  --  106*  --  114*  BUN 26*   < > 29*   < > 21   < > 24* 25* 32*  CREATININE 1.94*   < > 1.84*   < > 1.88*   < > 2.08* 1.9* 1.82*  CALCIUM 9.0   < > 9.6   < > 8.4*   < > 8.6* 8.3* 8.5*  MG 2.1  --  2.3  --   --   --   --   --   --    < > = values in this interval not displayed.   Recent Labs    09/30/19 0209 09/30/19 0209 11/12/19 1536 02/26/20 1620 03/20/20 0000  AST 15   < > 11 13 13*  ALT 11   < > 7* 8* 11  ALKPHOS 53  --   --   --  53  BILITOT 0.3  --  0.5 0.6  --   PROT 5.7*  --  6.2 6.6  --   ALBUMIN 3.4*  --   --   --  3.4*   < > = values in this interval not displayed.   Recent Labs    11/12/19 1536 11/12/19 1536 02/26/20 1620 03/11/20 2307 03/14/20 0059 03/14/20 0059 03/15/20 0102 03/20/20 0000 05/01/20 2220  WBC 6.6   < > 8.4   < > 8.3   < > 7.5 6.4 8.3  NEUTROABS 5,029  --  6,275  --   --   --   --  4,864.00  --   HGB 11.4*   < > 13.0*   < > 11.0*   < > 10.5* 10.3* 8.8*  HCT 33.6*   < > 38.4*   < > 32.6*   < > 30.5* 30* 27.8*  MCV 98.0   < > 97.0   < > 96.7  --   97.1  --  101.8*  PLT 123*   < > 145   < > 116*   < > 103* 99* 159   < > = values in this interval not displayed.   Lab Results  Component Value Date   TSH 3.715 03/12/2020   Lab Results  Component Value Date   HGBA1C 5.1 09/10/2019   Lab Results  Component Value Date   CHOL 171 02/26/2020   HDL 41 02/26/2020   LDLCALC 104 (H) 02/26/2020   TRIG 151 (H) 02/26/2020   CHOLHDL 4.2 02/26/2020    Significant Diagnostic Results in last 30 days:  DG Chest 2 View  Result Date: 05/01/2020 CLINICAL DATA:  Chest pain. EXAM: CHEST - 2 VIEW COMPARISON:  March 11, 2020 FINDINGS: The heart size and mediastinal contours are within normal limits. There is moderate severity calcification of the thoracic aorta. Both lungs are clear. Degenerative changes are seen throughout the thoracic spine. IMPRESSION: No active cardiopulmonary disease. Electronically Signed   By: Virgina Norfolk M.D.   On: 05/01/2020 23:27    Assessment/Plan Chest pain, unspecified type GI consult Start him on Imdur 15 mg QD  NSTEMI (non-ST elevated myocardial infarction) [facility]) s/p Drug Eluting LAD  Stent placement On Statin, Dual Therapy with Aspirin and Plavix Has to continue for 1 year and then Aspirin for indefinite   Anemia, unspecified type Stool occult  Repeat CBC , Iron Ferritin and B12 Change Protonix to BID Start on Iron after occult  Stage 3b chronic kidney disease ([place]) Creat Stable  Early onset Alzheimer's dementia without behavioral disturbance ([place]) Not tolerated any meds Sees Neurologist Last MMSE in 2019 was 23  Essential hypertension, benign Decrase the Lopressor to 12.5 mg BID due to low BP  Depression, recurrent (HCC) On Zoloft  Hyperlipidemia, unspecified hyperlipidemia type On Lipitor Check Lipid again    Family/ staff Communication:   Labs/tests ordered:  CBC,Ferritin, iron studies Vit B12 and Lipid panel

## 2020-05-05 DIAGNOSIS — N183 Chronic kidney disease, stage 3 unspecified: Secondary | ICD-10-CM | POA: Diagnosis not present

## 2020-05-05 DIAGNOSIS — I10 Essential (primary) hypertension: Secondary | ICD-10-CM | POA: Diagnosis not present

## 2020-05-05 DIAGNOSIS — D510 Vitamin B12 deficiency anemia due to intrinsic factor deficiency: Secondary | ICD-10-CM | POA: Diagnosis not present

## 2020-05-05 DIAGNOSIS — E7849 Other hyperlipidemia: Secondary | ICD-10-CM | POA: Diagnosis not present

## 2020-05-05 DIAGNOSIS — E782 Mixed hyperlipidemia: Secondary | ICD-10-CM | POA: Diagnosis not present

## 2020-05-05 DIAGNOSIS — I214 Non-ST elevation (NSTEMI) myocardial infarction: Secondary | ICD-10-CM | POA: Diagnosis not present

## 2020-05-05 DIAGNOSIS — D649 Anemia, unspecified: Secondary | ICD-10-CM | POA: Diagnosis not present

## 2020-05-13 DIAGNOSIS — R41841 Cognitive communication deficit: Secondary | ICD-10-CM | POA: Diagnosis not present

## 2020-05-13 DIAGNOSIS — E782 Mixed hyperlipidemia: Secondary | ICD-10-CM | POA: Diagnosis not present

## 2020-05-13 DIAGNOSIS — G301 Alzheimer's disease with late onset: Secondary | ICD-10-CM | POA: Diagnosis not present

## 2020-05-13 DIAGNOSIS — I214 Non-ST elevation (NSTEMI) myocardial infarction: Secondary | ICD-10-CM | POA: Diagnosis not present

## 2020-05-13 DIAGNOSIS — F0280 Dementia in other diseases classified elsewhere without behavioral disturbance: Secondary | ICD-10-CM | POA: Diagnosis not present

## 2020-05-13 DIAGNOSIS — R2681 Unsteadiness on feet: Secondary | ICD-10-CM | POA: Diagnosis not present

## 2020-05-13 DIAGNOSIS — M6281 Muscle weakness (generalized): Secondary | ICD-10-CM | POA: Diagnosis not present

## 2020-05-13 DIAGNOSIS — R0789 Other chest pain: Secondary | ICD-10-CM | POA: Diagnosis not present

## 2020-05-14 DIAGNOSIS — R41841 Cognitive communication deficit: Secondary | ICD-10-CM | POA: Diagnosis not present

## 2020-05-14 DIAGNOSIS — R0789 Other chest pain: Secondary | ICD-10-CM | POA: Diagnosis not present

## 2020-05-14 DIAGNOSIS — R2681 Unsteadiness on feet: Secondary | ICD-10-CM | POA: Diagnosis not present

## 2020-05-14 DIAGNOSIS — I214 Non-ST elevation (NSTEMI) myocardial infarction: Secondary | ICD-10-CM | POA: Diagnosis not present

## 2020-05-14 DIAGNOSIS — E782 Mixed hyperlipidemia: Secondary | ICD-10-CM | POA: Diagnosis not present

## 2020-05-14 DIAGNOSIS — M6281 Muscle weakness (generalized): Secondary | ICD-10-CM | POA: Diagnosis not present

## 2020-05-18 DIAGNOSIS — E782 Mixed hyperlipidemia: Secondary | ICD-10-CM | POA: Diagnosis not present

## 2020-05-18 DIAGNOSIS — M6281 Muscle weakness (generalized): Secondary | ICD-10-CM | POA: Diagnosis not present

## 2020-05-18 DIAGNOSIS — R2681 Unsteadiness on feet: Secondary | ICD-10-CM | POA: Diagnosis not present

## 2020-05-18 DIAGNOSIS — R0789 Other chest pain: Secondary | ICD-10-CM | POA: Diagnosis not present

## 2020-05-18 DIAGNOSIS — I214 Non-ST elevation (NSTEMI) myocardial infarction: Secondary | ICD-10-CM | POA: Diagnosis not present

## 2020-05-18 DIAGNOSIS — R41841 Cognitive communication deficit: Secondary | ICD-10-CM | POA: Diagnosis not present

## 2020-05-19 DIAGNOSIS — M6281 Muscle weakness (generalized): Secondary | ICD-10-CM | POA: Diagnosis not present

## 2020-05-19 DIAGNOSIS — R2681 Unsteadiness on feet: Secondary | ICD-10-CM | POA: Diagnosis not present

## 2020-05-19 DIAGNOSIS — R0789 Other chest pain: Secondary | ICD-10-CM | POA: Diagnosis not present

## 2020-05-19 DIAGNOSIS — E782 Mixed hyperlipidemia: Secondary | ICD-10-CM | POA: Diagnosis not present

## 2020-05-19 DIAGNOSIS — I214 Non-ST elevation (NSTEMI) myocardial infarction: Secondary | ICD-10-CM | POA: Diagnosis not present

## 2020-05-19 DIAGNOSIS — R41841 Cognitive communication deficit: Secondary | ICD-10-CM | POA: Diagnosis not present

## 2020-05-20 DIAGNOSIS — R0789 Other chest pain: Secondary | ICD-10-CM | POA: Diagnosis not present

## 2020-05-20 DIAGNOSIS — R41841 Cognitive communication deficit: Secondary | ICD-10-CM | POA: Diagnosis not present

## 2020-05-20 DIAGNOSIS — E782 Mixed hyperlipidemia: Secondary | ICD-10-CM | POA: Diagnosis not present

## 2020-05-20 DIAGNOSIS — R2681 Unsteadiness on feet: Secondary | ICD-10-CM | POA: Diagnosis not present

## 2020-05-20 DIAGNOSIS — I214 Non-ST elevation (NSTEMI) myocardial infarction: Secondary | ICD-10-CM | POA: Diagnosis not present

## 2020-05-20 DIAGNOSIS — M6281 Muscle weakness (generalized): Secondary | ICD-10-CM | POA: Diagnosis not present

## 2020-05-21 DIAGNOSIS — R0789 Other chest pain: Secondary | ICD-10-CM | POA: Diagnosis not present

## 2020-05-21 DIAGNOSIS — I214 Non-ST elevation (NSTEMI) myocardial infarction: Secondary | ICD-10-CM | POA: Diagnosis not present

## 2020-05-21 DIAGNOSIS — R41841 Cognitive communication deficit: Secondary | ICD-10-CM | POA: Diagnosis not present

## 2020-05-21 DIAGNOSIS — M6281 Muscle weakness (generalized): Secondary | ICD-10-CM | POA: Diagnosis not present

## 2020-05-21 DIAGNOSIS — R2681 Unsteadiness on feet: Secondary | ICD-10-CM | POA: Diagnosis not present

## 2020-05-21 DIAGNOSIS — E782 Mixed hyperlipidemia: Secondary | ICD-10-CM | POA: Diagnosis not present

## 2020-05-26 DIAGNOSIS — R2681 Unsteadiness on feet: Secondary | ICD-10-CM | POA: Diagnosis not present

## 2020-05-26 DIAGNOSIS — M6281 Muscle weakness (generalized): Secondary | ICD-10-CM | POA: Diagnosis not present

## 2020-05-26 DIAGNOSIS — R41841 Cognitive communication deficit: Secondary | ICD-10-CM | POA: Diagnosis not present

## 2020-05-26 DIAGNOSIS — R0789 Other chest pain: Secondary | ICD-10-CM | POA: Diagnosis not present

## 2020-05-26 DIAGNOSIS — E782 Mixed hyperlipidemia: Secondary | ICD-10-CM | POA: Diagnosis not present

## 2020-05-26 DIAGNOSIS — I214 Non-ST elevation (NSTEMI) myocardial infarction: Secondary | ICD-10-CM | POA: Diagnosis not present

## 2020-05-28 DIAGNOSIS — R2681 Unsteadiness on feet: Secondary | ICD-10-CM | POA: Diagnosis not present

## 2020-05-28 DIAGNOSIS — R41841 Cognitive communication deficit: Secondary | ICD-10-CM | POA: Diagnosis not present

## 2020-05-28 DIAGNOSIS — R0789 Other chest pain: Secondary | ICD-10-CM | POA: Diagnosis not present

## 2020-05-28 DIAGNOSIS — E782 Mixed hyperlipidemia: Secondary | ICD-10-CM | POA: Diagnosis not present

## 2020-05-28 DIAGNOSIS — I214 Non-ST elevation (NSTEMI) myocardial infarction: Secondary | ICD-10-CM | POA: Diagnosis not present

## 2020-05-28 DIAGNOSIS — M6281 Muscle weakness (generalized): Secondary | ICD-10-CM | POA: Diagnosis not present

## 2020-06-01 DIAGNOSIS — R41841 Cognitive communication deficit: Secondary | ICD-10-CM | POA: Diagnosis not present

## 2020-06-01 DIAGNOSIS — E782 Mixed hyperlipidemia: Secondary | ICD-10-CM | POA: Diagnosis not present

## 2020-06-01 DIAGNOSIS — M6281 Muscle weakness (generalized): Secondary | ICD-10-CM | POA: Diagnosis not present

## 2020-06-01 DIAGNOSIS — R0789 Other chest pain: Secondary | ICD-10-CM | POA: Diagnosis not present

## 2020-06-01 DIAGNOSIS — I214 Non-ST elevation (NSTEMI) myocardial infarction: Secondary | ICD-10-CM | POA: Diagnosis not present

## 2020-06-01 DIAGNOSIS — R2681 Unsteadiness on feet: Secondary | ICD-10-CM | POA: Diagnosis not present

## 2020-06-03 DIAGNOSIS — R2681 Unsteadiness on feet: Secondary | ICD-10-CM | POA: Diagnosis not present

## 2020-06-03 DIAGNOSIS — I214 Non-ST elevation (NSTEMI) myocardial infarction: Secondary | ICD-10-CM | POA: Diagnosis not present

## 2020-06-03 DIAGNOSIS — R0789 Other chest pain: Secondary | ICD-10-CM | POA: Diagnosis not present

## 2020-06-03 DIAGNOSIS — M6281 Muscle weakness (generalized): Secondary | ICD-10-CM | POA: Diagnosis not present

## 2020-06-03 DIAGNOSIS — E782 Mixed hyperlipidemia: Secondary | ICD-10-CM | POA: Diagnosis not present

## 2020-06-03 DIAGNOSIS — R41841 Cognitive communication deficit: Secondary | ICD-10-CM | POA: Diagnosis not present

## 2020-06-05 DIAGNOSIS — R0789 Other chest pain: Secondary | ICD-10-CM | POA: Diagnosis not present

## 2020-06-05 DIAGNOSIS — M6281 Muscle weakness (generalized): Secondary | ICD-10-CM | POA: Diagnosis not present

## 2020-06-05 DIAGNOSIS — E782 Mixed hyperlipidemia: Secondary | ICD-10-CM | POA: Diagnosis not present

## 2020-06-05 DIAGNOSIS — I214 Non-ST elevation (NSTEMI) myocardial infarction: Secondary | ICD-10-CM | POA: Diagnosis not present

## 2020-06-05 DIAGNOSIS — R2681 Unsteadiness on feet: Secondary | ICD-10-CM | POA: Diagnosis not present

## 2020-06-05 DIAGNOSIS — R41841 Cognitive communication deficit: Secondary | ICD-10-CM | POA: Diagnosis not present

## 2020-06-10 DIAGNOSIS — M6281 Muscle weakness (generalized): Secondary | ICD-10-CM | POA: Diagnosis not present

## 2020-06-10 DIAGNOSIS — R0789 Other chest pain: Secondary | ICD-10-CM | POA: Diagnosis not present

## 2020-06-10 DIAGNOSIS — R2681 Unsteadiness on feet: Secondary | ICD-10-CM | POA: Diagnosis not present

## 2020-06-10 DIAGNOSIS — E782 Mixed hyperlipidemia: Secondary | ICD-10-CM | POA: Diagnosis not present

## 2020-06-10 DIAGNOSIS — I214 Non-ST elevation (NSTEMI) myocardial infarction: Secondary | ICD-10-CM | POA: Diagnosis not present

## 2020-06-10 DIAGNOSIS — R41841 Cognitive communication deficit: Secondary | ICD-10-CM | POA: Diagnosis not present

## 2020-06-11 DIAGNOSIS — I214 Non-ST elevation (NSTEMI) myocardial infarction: Secondary | ICD-10-CM | POA: Diagnosis not present

## 2020-06-11 DIAGNOSIS — R2681 Unsteadiness on feet: Secondary | ICD-10-CM | POA: Diagnosis not present

## 2020-06-11 DIAGNOSIS — E782 Mixed hyperlipidemia: Secondary | ICD-10-CM | POA: Diagnosis not present

## 2020-06-11 DIAGNOSIS — R0789 Other chest pain: Secondary | ICD-10-CM | POA: Diagnosis not present

## 2020-06-11 DIAGNOSIS — M6281 Muscle weakness (generalized): Secondary | ICD-10-CM | POA: Diagnosis not present

## 2020-06-11 DIAGNOSIS — R41841 Cognitive communication deficit: Secondary | ICD-10-CM | POA: Diagnosis not present

## 2020-06-12 DIAGNOSIS — R0789 Other chest pain: Secondary | ICD-10-CM | POA: Diagnosis not present

## 2020-06-12 DIAGNOSIS — R2681 Unsteadiness on feet: Secondary | ICD-10-CM | POA: Diagnosis not present

## 2020-06-12 DIAGNOSIS — E782 Mixed hyperlipidemia: Secondary | ICD-10-CM | POA: Diagnosis not present

## 2020-06-12 DIAGNOSIS — M6281 Muscle weakness (generalized): Secondary | ICD-10-CM | POA: Diagnosis not present

## 2020-06-12 DIAGNOSIS — I214 Non-ST elevation (NSTEMI) myocardial infarction: Secondary | ICD-10-CM | POA: Diagnosis not present

## 2020-06-12 DIAGNOSIS — R41841 Cognitive communication deficit: Secondary | ICD-10-CM | POA: Diagnosis not present

## 2020-06-17 ENCOUNTER — Non-Acute Institutional Stay: Payer: Medicare Other | Admitting: Nurse Practitioner

## 2020-06-17 ENCOUNTER — Encounter: Payer: Self-pay | Admitting: Nurse Practitioner

## 2020-06-17 DIAGNOSIS — I2510 Atherosclerotic heart disease of native coronary artery without angina pectoris: Secondary | ICD-10-CM

## 2020-06-17 DIAGNOSIS — I10 Essential (primary) hypertension: Secondary | ICD-10-CM | POA: Diagnosis not present

## 2020-06-17 DIAGNOSIS — R41841 Cognitive communication deficit: Secondary | ICD-10-CM | POA: Diagnosis not present

## 2020-06-17 DIAGNOSIS — F0280 Dementia in other diseases classified elsewhere without behavioral disturbance: Secondary | ICD-10-CM | POA: Diagnosis not present

## 2020-06-17 DIAGNOSIS — N1832 Chronic kidney disease, stage 3b: Secondary | ICD-10-CM

## 2020-06-17 DIAGNOSIS — I214 Non-ST elevation (NSTEMI) myocardial infarction: Secondary | ICD-10-CM | POA: Diagnosis not present

## 2020-06-17 DIAGNOSIS — D509 Iron deficiency anemia, unspecified: Secondary | ICD-10-CM | POA: Insufficient documentation

## 2020-06-17 DIAGNOSIS — G609 Hereditary and idiopathic neuropathy, unspecified: Secondary | ICD-10-CM | POA: Diagnosis not present

## 2020-06-17 DIAGNOSIS — F339 Major depressive disorder, recurrent, unspecified: Secondary | ICD-10-CM | POA: Diagnosis not present

## 2020-06-17 DIAGNOSIS — K219 Gastro-esophageal reflux disease without esophagitis: Secondary | ICD-10-CM

## 2020-06-17 DIAGNOSIS — G300 Alzheimer's disease with early onset: Secondary | ICD-10-CM

## 2020-06-17 DIAGNOSIS — D500 Iron deficiency anemia secondary to blood loss (chronic): Secondary | ICD-10-CM | POA: Diagnosis not present

## 2020-06-17 NOTE — Assessment & Plan Note (Signed)
Stable, continue Pantoprazole.  

## 2020-06-17 NOTE — Progress Notes (Signed)
Location:   [facility] Place of Service:  ALF (13) Provider: Lennie Odor Dashanti Burr NP  Virgie Dad, MD  Patient Care Team: Virgie Dad, MD as PCP - General (Internal Medicine) Elouise Munroe, MD as PCP - Cardiology (Cardiology)  Extended Emergency Contact Information Primary Emergency Contact: Weeks,[name] Address: [address]          [place], [place] [address] [name] of [place] Phone: [phone] Mobile Phone: [phone] Relation: Daughter Secondary Emergency Contact: [name], [place] of [place] Mobile Phone: [phone] Relation: Daughter  Code Status:  DNR Goals of care: Advanced Directive information Advanced Directives 06/17/2020  Does Patient Have a Medical Advance Directive? Yes  Type of Paramedic of [place];Living will  Does patient want to make changes to medical advance directive? No - Patient declined  Copy of [place] in Chart? Yes - validated most recent copy scanned in chart (See row information)  Would patient like information on creating a medical advance directive? -     Chief Complaint  Patient presents with  . Medical Management of Chronic Issues    Routine follow up visit. Discuss need for Tetanus/Tdap and Flu vaccine    HPI:  Pt is a 87 y.o. male seen today for medical management of chronic diseases.    CAD, NSTEMI stent f/u cardiology. Hospital stay 03/11/20-03/16/20 for NSTEMI, troponin 300s at ED, Cath showed LAD stenosis, underwent drug eluting stent, EF 55-60%, takes Atorvastatin, ASA, Plavix, Metoprolol. Added Isosorbide 24m since last chest pain 05/01/20 HTN, takes Metoprolol.  GERD, takes Pantoprazole, Hgb 8.8 05/01/20 AI, EF 55-60% CKD stage 3, Bun/creat 32/1.82 05/01/20 Alzheimer's dementia, didn't tolerate memory meds, underwent neurology eval in the past.  Peripheral  neuropathy, takes Gabapentin Depression takes Sertraline  Anemia, takes Iron, Hgb 8.8 05/01/20   Past Medical History:  Diagnosis Date  . Anemia   . Arthritis    arthritis,osteopenia,"spinal stenosis"  . Bladder neck obstruction 03/28/2011  . CAD (coronary artery disease)   . Cancer ([facility] 12-06-12   Prostate cancer'98  . CKD (chronic kidney disease) stage 3, GFR 30-59 ml/min (HCC) 08/17/2017  . Depression   . Essential hypertension, benign 04/10/2009  . GERD (gastroesophageal reflux disease)   . GERD without esophagitis 11/05/2016  . H/O hiatal hernia   . H/O vertigo 12-06-12   none recent  . Hyperlipidemia   . IBS (irritable bowel syndrome)   . Mixed hyperlipidemia 04/10/2009  . Neuropathy   . Osteopenia   . Peripheral neuropathy 12-06-12   peripheral neuropathy  . Rhinitis   . RLS (restless legs syndrome)   . Vascular dementia ([place] 09/30/2019  . Vitamin B12 deficiency    Past Surgical History:  Procedure Laterality Date  . APPENDECTOMY    . CARPAL TUNNEL RELEASE     both hands  . cataract surgery Left 12-06-12   recent surgery  . CHOLECYSTECTOMY    . COLONOSCOPY WITH PROPOFOL N/A 12/25/2012   Procedure: COLONOSCOPY WITH PROPOFOL;  Surgeon: MGarlan Fair MD;  [facility];  Service: Endoscopy;  Laterality: N/A;  . CORONARY STENT INTERVENTION N/A 03/13/2020   Procedure: CORONARY STENT INTERVENTION;  Surgeon: ENelva Bush MD;  [facility];  Service: Cardiovascular;  Laterality: N/A;  . ESOPHAGOGASTRODUODENOSCOPY (EGD) WITH PROPOFOL N/A 12/25/2012   Procedure: ESOPHAGOGASTRODUODENOSCOPY (EGD) WITH PROPOFOL;  Surgeon: MGarlan Fair MD;  [facility];  Service: Endoscopy;  Laterality: N/A;  . INTRAVASCULAR ULTRASOUND/IVUS N/A 03/13/2020   Procedure: Intravascular Ultrasound/IVUS;  Surgeon: Nelva Bush, MD;  Location: [facility] LAB;  Service: Cardiovascular;  Laterality: N/A;  . LEFT HEART CATH AND CORONARY  ANGIOGRAPHY N/A 03/13/2020   Procedure: LEFT HEART CATH AND CORONARY ANGIOGRAPHY;  Surgeon: Nelva Bush, MD;  Location: [facility] CV LAB;  Service: Cardiovascular;  Laterality: N/A;  . PROSTATE SURGERY  12-06-12  . TONSILLECTOMY    . TOTAL KNEE ARTHROPLASTY Right 11/25/2014   Procedure: RIGHT TOTAL KNEE ARTHROPLASTY;  Surgeon: Melrose Nakayama, MD;  Location: [facility];  Service: Orthopedics;  Laterality: Right;    Allergies  Allergen Reactions  . Niacin Itching  . Penicillins Other (See Comments)    "rash"   . Tramadol Itching    Allergies as of 06/17/2020      Reactions   Niacin Itching   Penicillins Other (See Comments)   "rash"   Tramadol Itching      Medication List       Accurate as of June 17, 2020 11:59 PM. If you have any questions, ask your nurse or doctor.        aspirin EC 81 MG tablet Take 81 mg by mouth daily. Swallow whole.   atorvastatin 80 MG tablet Commonly known as: LIPITOR Take 1 tablet (80 mg total) by mouth daily.   clopidogrel 75 MG tablet Commonly known as: PLAVIX Take 1 tablet (75 mg total) by mouth daily with breakfast.   gabapentin 400 MG capsule Commonly known as: NEURONTIN Take 1 capsule 3 times a day for nerve pain. What changed:   how much to take  how to take this  when to take this   Iron (Ferrous Sulfate) 325 (65 Fe) MG Tabs Take 1 tablet by mouth daily.   isosorbide mononitrate 30 MG 24 hr tablet Commonly known as: IMDUR Take 30 mg by mouth daily. Take 1/2 tab =15 mg, oral, Once A Day, **HOLD IF SBP IS LESS THAN 100**   metoprolol tartrate 25 MG tablet Commonly known as: LOPRESSOR Take 1 tablet (25 mg total) by mouth 2 (two) times daily. What changed:   how much to take  additional instructions   nitroGLYCERIN 0.4 MG SL tablet Commonly known as: NITROSTAT Place 1 tablet (0.4 mg total) under the tongue every 5 (five) minutes as needed for chest pain.   pantoprazole 40 MG tablet Commonly known as: PROTONIX Take  1 tablet (40 mg total) by mouth daily.   sertraline 100 MG tablet Commonly known as: ZOLOFT 1 tablet Daily for Mood   Vitamin D 50 MCG (2000 UT) tablet Take 1 pill daily.       Review of Systems  Constitutional: Negative for fatigue, fever and unexpected weight change.  HENT: Positive for hearing loss. Negative for congestion and voice change.   Eyes: Negative for visual disturbance.  Respiratory: Negative for cough, shortness of breath and wheezing.   Cardiovascular: Positive for leg swelling. Negative for chest pain and palpitations.  Gastrointestinal: Negative for abdominal pain, constipation, nausea and vomiting.  Genitourinary: Negative for dysuria, frequency and urgency.  Musculoskeletal: Positive for gait problem.  Skin: Negative for color change.  Neurological: Negative for speech difficulty, weakness, light-headedness and headaches.       Memory lapses.   Psychiatric/Behavioral: Negative for behavioral problems, hallucinations and sleep disturbance. The patient is not nervous/anxious.     Immunization History  Administered Date(s) Administered  . Influenza, High Dose Seasonal PF 03/05/2015, 03/05/2018  .  Influenza-Unspecified 04/22/2016, 03/13/2017, 06/13/2018, 03/28/2019  . Moderna Sars-Covid-2 Vaccination 06/17/2019, 07/15/2019, 04/21/2020  . Pneumococcal Conjugate-13 05/23/2017  . Pneumococcal Polysaccharide-23 01/20/2016   Pertinent  Health Maintenance Due  Topic Date Due  . INFLUENZA VACCINE  01/12/2020  . PNA vac Low Risk Adult  Completed   Fall Risk  02/26/2020 09/09/2019 02/14/2019 07/26/2018 12/03/2017  Falls in the past year? 0 0 0 0 No  Number falls in past yr: 0 - 0 - -  Injury with Fall? 0 - 0 - -  Risk Factor Category  - - - - -  Risk for fall due to : No Fall Risks No Fall Risks - - -  Follow up Falls evaluation completed;Falls prevention discussed Falls prevention discussed;Education provided;Falls evaluation completed - - -   Functional Status  Survey:    Vitals:   06/17/20 1325  BP: 122/62  Pulse: 74  Resp: 20  Temp: 98.5 F (36.9 C)  SpO2: 92%  Weight: 167 lb (75.8 kg)   Body mass index is 22.65 kg/m. Physical Exam Vitals and nursing note reviewed.  Constitutional:      Appearance: Normal appearance.  HENT:     Head: Normocephalic and atraumatic.     Nose: Nose normal.     Mouth/Throat:     Mouth: Mucous membranes are moist.  Eyes:     Extraocular Movements: Extraocular movements intact.     Conjunctiva/sclera: Conjunctivae normal.     Pupils: Pupils are equal, round, and reactive to light.  Cardiovascular:     Rate and Rhythm: Normal rate and regular rhythm.     Heart sounds: No murmur heard.   Pulmonary:     Effort: Pulmonary effort is normal.     Breath sounds: No wheezing.  Abdominal:     General: Bowel sounds are normal.     Palpations: Abdomen is soft.     Tenderness: There is no abdominal tenderness.  Musculoskeletal:     Cervical back: Normal range of motion and neck supple.     Right lower leg: Edema present.     Left lower leg: Edema present.     Comments: Minimal edema BLE  Skin:    General: Skin is warm and dry.  Neurological:     General: No focal deficit present.     Mental Status: He is alert. Mental status is at baseline.     Motor: No weakness.     Gait: Gait abnormal.     Comments: Oriented to person, place  Psychiatric:        Mood and Affect: Mood normal.        Behavior: Behavior normal.     Labs reviewed: Recent Labs    09/10/19 1524 09/30/19 0209 02/26/20 1620 03/11/20 2307 03/15/20 0102 03/16/20 0120 03/20/20 0000 05/01/20 2220  NA 142   < > 143   < > 138 139 143 141  K 4.3   < > 5.0   < > 4.0 4.5 4.1 4.9  CL 105   < > 106   < > 107 108 110* 111  CO2 30   < > 30   < > 23 24 23* 21*  GLUCOSE 92   < > 113*   < > 113* 106*  --  114*  BUN 26*   < > 29*   < > 21 24* 25* 32*  CREATININE 1.94*   < > 1.84*   < > 1.88* 2.08* 1.9* 1.82*  CALCIUM 9.0   < >  9.6   < >  8.4* 8.6* 8.3* 8.5*  MG 2.1  --  2.3  --   --   --   --   --    < > = values in this interval not displayed.   Recent Labs    09/30/19 0209 11/12/19 1536 02/26/20 1620 03/20/20 0000  AST _0 13*  ALT 11 7* 8* 11  ALKPHOS 53  --   --  53  BILITOT 0.3 0.5 0.6  --   PROT 5.7* 6.2 6.6  --   ALBUMIN 3.4*  --   --  3.4*   Recent Labs    11/12/19 1536 02/26/20 1620 03/11/20 2307 03/14/20 0059 03/15/20 0102 03/20/20 0000 05/01/20 2220  WBC 6.6 8.4   < > 8.3 7.5 6.4 8.3  NEUTROABS 5,029 6,275  --   --   --  4,864.00  --   HGB 11.4* 13.0*   < > 11.0* 10.5* 10.3* 8.8*  HCT 33.6* 38.4*   < > 32.6* 30.5* 30* 27.8*  MCV 98.0 97.0   < > 96.7 97.1  --  101.8*  PLT 123* 145   < > 116* 103* 99* 159   < > = values in this interval not displayed.   Lab Results  Component Value Date   TSH 3.715 03/12/2020   Lab Results  Component Value Date   HGBA1C 5.1 09/10/2019   Lab Results  Component Value Date   CHOL 171 02/26/2020   HDL 41 02/26/2020   LDLCALC 104 (H) 02/26/2020   TRIG 151 (H) 02/26/2020   CHOLHDL 4.2 02/26/2020    Significant Diagnostic Results in last 30 days:  No results found.  Assessment/Plan  Iron deficiency anemia Anemia, takes Iron, Hgb 8.8 05/01/20, update CBC/diff.   Depression, recurrent ([place]) Stable, continue Sertraline.   Hereditary and idiopathic peripheral neuropathy Peripheral neuropathy, takes Gabapentin  Alzheimer's dementia without behavioral disturbance (HCC) Alzheimer's dementia, didn't tolerate memory meds, underwent neurology eval in the past.    CKD (chronic kidney disease) stage 3, GFR 30-59 ml/min (HCC) CKD stage 3, Bun/creat 32/1.82 05/01/20, update CMP/eGFR  GERD without esophagitis Stable, continue Pantoprazole.   Essential hypertension, benign Blood pressure is controlled, continue Metoprolol.   CORONARY ATHEROSCLEROSIS NATIVE CORONARY ARTERY AI, EF 55-60%. CAD, NSTEMI stent f/u cardiology. Hospital stay  03/11/20-03/16/20 for NSTEMI, troponin 300s at ED, Cath showed LAD stenosis, underwent drug eluting stent, EF 55-60%, takes Atorvastatin, ASA, Plavix, Metoprolol. Added Isosorbide 104m since last chest pain 05/01/20     Family/ staff Communication: plan of care reviewed with the patient and charge nurse.   Labs/tests ordered:  CBC/diff, CMP/eGFR  Time spend 40 minutes.

## 2020-06-17 NOTE — Assessment & Plan Note (Signed)
Alzheimer's dementia, didn't tolerate memory meds, underwent neurology eval in the past. 

## 2020-06-17 NOTE — Assessment & Plan Note (Signed)
Stable, continue Sertraline.  

## 2020-06-17 NOTE — Assessment & Plan Note (Signed)
Blood pressure is controlled, continue Metoprolol. 

## 2020-06-17 NOTE — Assessment & Plan Note (Signed)
CKD stage 3, Bun/creat 32/1.82 05/01/20, update CMP/eGFR

## 2020-06-17 NOTE — Assessment & Plan Note (Signed)
Anemia, takes Iron, Hgb 8.8 05/01/20, update CBC/diff.

## 2020-06-17 NOTE — Assessment & Plan Note (Signed)
AI, EF 55-60%. CAD, NSTEMI stent f/u cardiology. Hospital stay 03/11/20-03/16/20 for NSTEMI, troponin 300s at ED, Cath showed LAD stenosis, underwent drug eluting stent, EF 55-60%, takes Atorvastatin, ASA, Plavix, Metoprolol. Added Isosorbide 15mg  since last chest pain 05/01/20

## 2020-06-17 NOTE — Assessment & Plan Note (Signed)
Peripheral neuropathy, takes Gabapentin  

## 2020-06-18 ENCOUNTER — Encounter: Payer: Self-pay | Admitting: Nurse Practitioner

## 2020-06-18 DIAGNOSIS — R41841 Cognitive communication deficit: Secondary | ICD-10-CM | POA: Diagnosis not present

## 2020-06-18 DIAGNOSIS — I214 Non-ST elevation (NSTEMI) myocardial infarction: Secondary | ICD-10-CM | POA: Diagnosis not present

## 2020-06-18 DIAGNOSIS — I10 Essential (primary) hypertension: Secondary | ICD-10-CM | POA: Diagnosis not present

## 2020-06-18 LAB — BASIC METABOLIC PANEL
BUN: 27 — AB (ref 4–21)
CO2: 30 — AB (ref 13–22)
Chloride: 105 (ref 99–108)
Creatinine: 1.9 — AB (ref 0.6–1.3)
Glucose: 86
Potassium: 3.7 (ref 3.4–5.3)
Sodium: 144 (ref 137–147)

## 2020-06-18 LAB — CBC AND DIFFERENTIAL
HCT: 35 — AB (ref 41–53)
Hemoglobin: 11.6 — AB (ref 13.5–17.5)
Neutrophils Absolute: 7325
Platelets: 154 (ref 150–399)
WBC: 9.5

## 2020-06-18 LAB — HEPATIC FUNCTION PANEL
ALT: 9 — AB (ref 10–40)
AST: 12 — AB (ref 14–40)
Alkaline Phosphatase: 82 (ref 25–125)
Bilirubin, Total: 0.6

## 2020-06-18 LAB — COMPREHENSIVE METABOLIC PANEL
Albumin: 4.6 (ref 3.5–5.0)
Calcium: 9.4 (ref 8.7–10.7)
Globulin: 2.4

## 2020-06-18 LAB — CBC: RBC: 3.67 — AB (ref 3.87–5.11)

## 2020-06-19 DIAGNOSIS — I214 Non-ST elevation (NSTEMI) myocardial infarction: Secondary | ICD-10-CM | POA: Diagnosis not present

## 2020-06-19 DIAGNOSIS — R41841 Cognitive communication deficit: Secondary | ICD-10-CM | POA: Diagnosis not present

## 2020-06-22 DIAGNOSIS — R41841 Cognitive communication deficit: Secondary | ICD-10-CM | POA: Diagnosis not present

## 2020-06-22 DIAGNOSIS — I214 Non-ST elevation (NSTEMI) myocardial infarction: Secondary | ICD-10-CM | POA: Diagnosis not present

## 2020-07-15 ENCOUNTER — Encounter: Payer: Self-pay | Admitting: Nurse Practitioner

## 2020-07-15 NOTE — Progress Notes (Signed)
This encounter was created in error - please disregard.

## 2020-08-03 DIAGNOSIS — Z9181 History of falling: Secondary | ICD-10-CM | POA: Diagnosis not present

## 2020-08-03 DIAGNOSIS — M6281 Muscle weakness (generalized): Secondary | ICD-10-CM | POA: Diagnosis not present

## 2020-08-03 DIAGNOSIS — R2681 Unsteadiness on feet: Secondary | ICD-10-CM | POA: Diagnosis not present

## 2020-08-04 DIAGNOSIS — R2681 Unsteadiness on feet: Secondary | ICD-10-CM | POA: Diagnosis not present

## 2020-08-04 DIAGNOSIS — Z9181 History of falling: Secondary | ICD-10-CM | POA: Diagnosis not present

## 2020-08-04 DIAGNOSIS — M6281 Muscle weakness (generalized): Secondary | ICD-10-CM | POA: Diagnosis not present

## 2020-08-06 DIAGNOSIS — M6281 Muscle weakness (generalized): Secondary | ICD-10-CM | POA: Diagnosis not present

## 2020-08-06 DIAGNOSIS — R2681 Unsteadiness on feet: Secondary | ICD-10-CM | POA: Diagnosis not present

## 2020-08-06 DIAGNOSIS — Z9181 History of falling: Secondary | ICD-10-CM | POA: Diagnosis not present

## 2020-08-10 DIAGNOSIS — Z9181 History of falling: Secondary | ICD-10-CM | POA: Diagnosis not present

## 2020-08-10 DIAGNOSIS — R2681 Unsteadiness on feet: Secondary | ICD-10-CM | POA: Diagnosis not present

## 2020-08-10 DIAGNOSIS — M6281 Muscle weakness (generalized): Secondary | ICD-10-CM | POA: Diagnosis not present

## 2020-08-11 DIAGNOSIS — R2681 Unsteadiness on feet: Secondary | ICD-10-CM | POA: Diagnosis not present

## 2020-08-11 DIAGNOSIS — M6281 Muscle weakness (generalized): Secondary | ICD-10-CM | POA: Diagnosis not present

## 2020-08-11 DIAGNOSIS — Z9181 History of falling: Secondary | ICD-10-CM | POA: Diagnosis not present

## 2020-08-17 DIAGNOSIS — Z9181 History of falling: Secondary | ICD-10-CM | POA: Diagnosis not present

## 2020-08-17 DIAGNOSIS — R2681 Unsteadiness on feet: Secondary | ICD-10-CM | POA: Diagnosis not present

## 2020-08-17 DIAGNOSIS — M6281 Muscle weakness (generalized): Secondary | ICD-10-CM | POA: Diagnosis not present

## 2020-08-19 DIAGNOSIS — R2681 Unsteadiness on feet: Secondary | ICD-10-CM | POA: Diagnosis not present

## 2020-08-19 DIAGNOSIS — Z9181 History of falling: Secondary | ICD-10-CM | POA: Diagnosis not present

## 2020-08-19 DIAGNOSIS — M6281 Muscle weakness (generalized): Secondary | ICD-10-CM | POA: Diagnosis not present

## 2020-08-20 DIAGNOSIS — R2681 Unsteadiness on feet: Secondary | ICD-10-CM | POA: Diagnosis not present

## 2020-08-20 DIAGNOSIS — M6281 Muscle weakness (generalized): Secondary | ICD-10-CM | POA: Diagnosis not present

## 2020-08-20 DIAGNOSIS — Z9181 History of falling: Secondary | ICD-10-CM | POA: Diagnosis not present

## 2020-08-24 DIAGNOSIS — R2681 Unsteadiness on feet: Secondary | ICD-10-CM | POA: Diagnosis not present

## 2020-08-24 DIAGNOSIS — M6281 Muscle weakness (generalized): Secondary | ICD-10-CM | POA: Diagnosis not present

## 2020-08-24 DIAGNOSIS — Z9181 History of falling: Secondary | ICD-10-CM | POA: Diagnosis not present

## 2020-08-26 DIAGNOSIS — R2681 Unsteadiness on feet: Secondary | ICD-10-CM | POA: Diagnosis not present

## 2020-08-26 DIAGNOSIS — Z9181 History of falling: Secondary | ICD-10-CM | POA: Diagnosis not present

## 2020-08-26 DIAGNOSIS — M6281 Muscle weakness (generalized): Secondary | ICD-10-CM | POA: Diagnosis not present

## 2020-08-27 DIAGNOSIS — R2681 Unsteadiness on feet: Secondary | ICD-10-CM | POA: Diagnosis not present

## 2020-08-27 DIAGNOSIS — Z9181 History of falling: Secondary | ICD-10-CM | POA: Diagnosis not present

## 2020-08-27 DIAGNOSIS — M6281 Muscle weakness (generalized): Secondary | ICD-10-CM | POA: Diagnosis not present

## 2020-08-31 ENCOUNTER — Non-Acute Institutional Stay: Payer: Medicare Other | Admitting: Nurse Practitioner

## 2020-08-31 ENCOUNTER — Encounter: Payer: Self-pay | Admitting: Nurse Practitioner

## 2020-08-31 DIAGNOSIS — G609 Hereditary and idiopathic neuropathy, unspecified: Secondary | ICD-10-CM

## 2020-08-31 DIAGNOSIS — I2510 Atherosclerotic heart disease of native coronary artery without angina pectoris: Secondary | ICD-10-CM

## 2020-08-31 DIAGNOSIS — N183 Chronic kidney disease, stage 3 unspecified: Secondary | ICD-10-CM

## 2020-08-31 DIAGNOSIS — G309 Alzheimer's disease, unspecified: Secondary | ICD-10-CM | POA: Diagnosis not present

## 2020-08-31 DIAGNOSIS — F0280 Dementia in other diseases classified elsewhere without behavioral disturbance: Secondary | ICD-10-CM | POA: Diagnosis not present

## 2020-08-31 DIAGNOSIS — K219 Gastro-esophageal reflux disease without esophagitis: Secondary | ICD-10-CM

## 2020-08-31 DIAGNOSIS — I10 Essential (primary) hypertension: Secondary | ICD-10-CM | POA: Diagnosis not present

## 2020-08-31 DIAGNOSIS — F339 Major depressive disorder, recurrent, unspecified: Secondary | ICD-10-CM | POA: Diagnosis not present

## 2020-08-31 DIAGNOSIS — D500 Iron deficiency anemia secondary to blood loss (chronic): Secondary | ICD-10-CM

## 2020-08-31 NOTE — Progress Notes (Signed)
Location:   [place] Room Number: [place] of Service:  ALF [phone]) Provider: Lennie Odor Reiss Mowrey NP  Virgie Dad, MD  Patient Care Team: Virgie Dad, MD as PCP - General (Internal Medicine) Elouise Munroe, MD as PCP - Cardiology (Cardiology)  Extended Emergency Contact Information Primary Emergency Contact: Weeks,[name] Address: [address]          [place], [place] [address] [name] of [place] Phone: [phone] Mobile Phone: [phone] Relation: Daughter Secondary Emergency Contact: [name], [place] of [place] Mobile Phone: [phone] Relation: Daughter  Code Status: DNR Goals of care: Advanced Directive information Advanced Directives 06/17/2020  Does Patient Have a Medical Advance Directive? Yes  Type of Paramedic of [place];Living will  Does patient want to make changes to medical advance directive? No - Patient declined  Copy of [place] in Chart? Yes - validated most recent copy scanned in chart (See row information)  Would patient like information on creating a medical advance directive? -     Chief Complaint  Patient presents with  . Acute Visit    Increased confusion, exit seeking, wandering    HPI:  Pt is a 87 y.o. male seen today for an acute visit for increased confusion, exit seeking, wandering. The patient denied headache, change of vision, chest pain/pressure, palpitation, cough, SOB, abd discomfort, dysuria, or focal weakness.   CAD, NSTEMI stent f/u cardiology. Hospital stay 03/11/20-03/16/20 for NSTEMI, troponin 300s at ED, Cath showed LAD stenosis, underwent drug eluting stent, EF 55-60%, takes Atorvastatin, ASA, Plavix, Metoprolol. Added Isosorbide 38m since last chest pain 05/01/20 HTN, takes Metoprolol.  GERD, takes Pantoprazole, Hgb 11.6 06/18/20 AI, EF 55-60% CKD stage  3,Bun/creat 27/1.9 06/18/20 Alzheimer's dementia, didn't tolerate memory meds, underwent neurology eval in the past.  Peripheral neuropathy, takes Gabapentin Depression takes Sertraline             Anemia, takes Iron, Hgb 11.6 06/18/20    Past Medical History:  Diagnosis Date  . Anemia   . Arthritis    arthritis,osteopenia,"spinal stenosis"  . Bladder neck obstruction 03/28/2011  . CAD (coronary artery disease)   . Cancer ([facility] 12-06-12   Prostate cancer'98  . CKD (chronic kidney disease) stage 3, GFR 30-59 ml/min (HCC) 08/17/2017  . Depression   . Essential hypertension, benign 04/10/2009  . GERD (gastroesophageal reflux disease)   . GERD without esophagitis 11/05/2016  . H/O hiatal hernia   . H/O vertigo 12-06-12   none recent  . Hyperlipidemia   . IBS (irritable bowel syndrome)   . Mixed hyperlipidemia 04/10/2009  . Neuropathy   . Osteopenia   . Peripheral neuropathy 12-06-12   peripheral neuropathy  . Rhinitis   . RLS (restless legs syndrome)   . Vascular dementia ([place] 09/30/2019  . Vitamin B12 deficiency    Past Surgical History:  Procedure Laterality Date  . APPENDECTOMY    . CARPAL TUNNEL RELEASE     both hands  . cataract surgery Left 12-06-12   recent surgery  . CHOLECYSTECTOMY    . COLONOSCOPY WITH PROPOFOL N/A 12/25/2012   Procedure: COLONOSCOPY WITH PROPOFOL;  Surgeon: MGarlan Fair MD;  [facility];  Service: Endoscopy;  Laterality: N/A;  . CORONARY STENT INTERVENTION N/A 03/13/2020   Procedure: CORONARY STENT INTERVENTION;  Surgeon: ENelva Bush MD;  [place] [facility];  Service: Cardiovascular;  Laterality: N/A;  .  ESOPHAGOGASTRODUODENOSCOPY (EGD) WITH PROPOFOL N/A 12/25/2012   Procedure: ESOPHAGOGASTRODUODENOSCOPY (EGD) WITH PROPOFOL;  Surgeon: Garlan Fair, MD;  [facility];  Service: Endoscopy;  Laterality: N/A;  . INTRAVASCULAR ULTRASOUND/IVUS N/A 03/13/2020   Procedure:  Intravascular Ultrasound/IVUS;  Surgeon: Nelva Bush, MD;  Location: [place] [facility] LAB;  Service: Cardiovascular;  Laterality: N/A;  . LEFT HEART CATH AND CORONARY ANGIOGRAPHY N/A 03/13/2020   Procedure: LEFT HEART CATH AND CORONARY ANGIOGRAPHY;  Surgeon: Nelva Bush, MD;  Location: [place] CV LAB;  Service: Cardiovascular;  Laterality: N/A;  . PROSTATE SURGERY  12-06-12  . TONSILLECTOMY    . TOTAL KNEE ARTHROPLASTY Right 11/25/2014   Procedure: RIGHT TOTAL KNEE ARTHROPLASTY;  Surgeon: Melrose Nakayama, MD;  Location: [place];  Service: Orthopedics;  Laterality: Right;    Allergies  Allergen Reactions  . Niacin Itching  . Penicillins Other (See Comments)    "rash"   . Tramadol Itching    Allergies as of 08/31/2020      Reactions   Niacin Itching   Penicillins Other (See Comments)   "rash"   Tramadol Itching      Medication List       Accurate as of August 31, 2020 11:59 PM. If you have any questions, ask your nurse or doctor.        aspirin EC 81 MG tablet Take 81 mg by mouth daily. Swallow whole.   atorvastatin 80 MG tablet Commonly known as: LIPITOR Take 1 tablet (80 mg total) by mouth daily.   clopidogrel 75 MG tablet Commonly known as: PLAVIX Take 1 tablet (75 mg total) by mouth daily with breakfast.   gabapentin 400 MG capsule Commonly known as: NEURONTIN Take 300 mg by mouth 3 (three) times daily.   Iron (Ferrous Sulfate) 325 (65 Fe) MG Tabs Take 1 tablet by mouth. Mon and Fri   isosorbide mononitrate 30 MG 24 hr tablet Commonly known as: IMDUR Take 30 mg by mouth daily. Take 1/2 tab =15 mg, oral, Once A Day, **HOLD IF SBP IS LESS THAN 100**   lactose free nutrition Liqd Take 237 mLs by mouth daily.   metoprolol tartrate 25 MG tablet Commonly known as: LOPRESSOR Take 12.5 mg by mouth 2 (two) times daily.   nitroGLYCERIN 0.4 MG SL tablet Commonly known as: NITROSTAT Place 1 tablet (0.4 mg total) under the tongue every 5 (five) minutes as  needed for chest pain.   pantoprazole 40 MG tablet Commonly known as: PROTONIX Take 40 mg by mouth 2 (two) times daily.   sertraline 100 MG tablet Commonly known as: ZOLOFT 1 tablet Daily for Mood   Vitamin D 50 MCG (2000 UT) tablet Take 1 pill daily.       Review of Systems  Constitutional: Positive for unexpected weight change. Negative for fatigue and fever.       Gradual weight loss, about #7 Ibs in the past 3 months.   HENT: Positive for hearing loss. Negative for congestion and voice change.   Eyes: Negative for visual disturbance.  Respiratory: Negative for cough, shortness of breath and wheezing.   Cardiovascular: Positive for leg swelling. Negative for chest pain and palpitations.  Gastrointestinal: Negative for abdominal pain, constipation, nausea and vomiting.  Genitourinary: Negative for dysuria, frequency and urgency.  Musculoskeletal: Positive for gait problem.  Skin: Negative for color change.  Neurological: Negative for speech difficulty, weakness, light-headedness and headaches.       Memory lapses.   Psychiatric/Behavioral: Negative for behavioral problems, hallucinations and  sleep disturbance. The patient is not nervous/anxious.     Immunization History  Administered Date(s) Administered  . Influenza, High Dose Seasonal PF 03/05/2015, 03/05/2018  . Influenza-Unspecified 04/22/2016, 03/13/2017, 06/13/2018, 03/28/2019  . Moderna Sars-Covid-2 Vaccination 06/17/2019, 07/15/2019, 04/21/2020  . Pneumococcal Conjugate-13 05/23/2017  . Pneumococcal Polysaccharide-23 01/20/2016   Pertinent  Health Maintenance Due  Topic Date Due  . INFLUENZA VACCINE  01/12/2020  . PNA vac Low Risk Adult  Completed   Fall Risk  02/26/2020 09/09/2019 02/14/2019 07/26/2018 12/03/2017  Falls in the past year? 0 0 0 0 No  Number falls in past yr: 0 - 0 - -  Injury with Fall? 0 - 0 - -  Risk Factor Category  - - - - -  Risk for fall due to : No Fall Risks No Fall Risks - - -  Follow  up Falls evaluation completed;Falls prevention discussed Falls prevention discussed;Education provided;Falls evaluation completed - - -   Functional Status Survey:    Vitals:   08/31/20 1604  BP: 118/68  Pulse: 66  Resp: 18  Temp: 98.1 F (36.7 C)  SpO2: 95%  Weight: 160 lb (72.6 kg)  Height: 6' (1.829 m)   Body mass index is 21.7 kg/m. Physical Exam Vitals and nursing note reviewed.  Constitutional:      Appearance: Normal appearance.  HENT:     Head: Normocephalic and atraumatic.     Mouth/Throat:     Mouth: Mucous membranes are moist.  Eyes:     Extraocular Movements: Extraocular movements intact.     Conjunctiva/sclera: Conjunctivae normal.     Pupils: Pupils are equal, round, and reactive to light.  Cardiovascular:     Rate and Rhythm: Normal rate and regular rhythm.     Heart sounds: No murmur heard.   Pulmonary:     Effort: Pulmonary effort is normal.     Breath sounds: No wheezing.  Abdominal:     General: Bowel sounds are normal.     Palpations: Abdomen is soft.     Tenderness: There is no abdominal tenderness.  Musculoskeletal:     Cervical back: Normal range of motion and neck supple.     Right lower leg: Edema present.     Left lower leg: Edema present.     Comments: Minimal edema BLE  Skin:    General: Skin is warm and dry.  Neurological:     General: No focal deficit present.     Mental Status: He is alert. Mental status is at baseline.     Motor: No weakness.     Gait: Gait abnormal.     Comments: Oriented to person, place  Psychiatric:        Mood and Affect: Mood normal.        Behavior: Behavior normal.     Labs reviewed: Recent Labs    09/10/19 1524 09/30/19 0209 02/26/20 1620 03/11/20 2307 03/15/20 0102 03/16/20 0120 03/20/20 0000 05/01/20 2220 06/18/20 0000  NA 142   < > 143   < > 138 139 143 141 144  K 4.3   < > 5.0   < > 4.0 4.5 4.1 4.9 3.7  CL 105   < > 106   < > 107 108 110* 111 105  CO2 30   < > 30   < > 23 24 23*  21* 30*  GLUCOSE 92   < > 113*   < > 113* 106*  --  114*  --  BUN 26*   < > 29*   < > 21 24* 25* 32* 27*  CREATININE 1.94*   < > 1.84*   < > 1.88* 2.08* 1.9* 1.82* 1.9*  CALCIUM 9.0   < > 9.6   < > 8.4* 8.6* 8.3* 8.5* 9.4  MG 2.1  --  2.3  --   --   --   --   --   --    < > = values in this interval not displayed.   Recent Labs    09/30/19 0209 11/12/19 1536 02/26/20 1620 03/20/20 0000 06/18/20 0000  AST _0 13* 12*  ALT 11 7* 8* 11 9*  ALKPHOS 53  --   --  53 82  BILITOT 0.3 0.5 0.6  --   --   PROT 5.7* 6.2 6.6  --   --   ALBUMIN 3.4*  --   --  3.4* 4.6   Recent Labs    02/26/20 1620 03/11/20 2307 03/14/20 0059 03/15/20 0102 03/20/20 0000 05/01/20 2220 06/18/20 0000  WBC 8.4   < > 8.3 7.5 6.4 8.3 9.5  NEUTROABS 6,275  --   --   --  4,864.00  --  7,325.00  HGB 13.0*   < > 11.0* 10.5* 10.3* 8.8* 11.6*  HCT 38.4*   < > 32.6* 30.5* 30* 27.8* 35*  MCV 97.0   < > 96.7 97.1  --  101.8*  --   PLT 145   < > 116* 103* 99* 159 154   < > = values in this interval not displayed.   Lab Results  Component Value Date   TSH 3.715 03/12/2020   Lab Results  Component Value Date   HGBA1C 5.1 09/10/2019   Lab Results  Component Value Date   CHOL 171 02/26/2020   HDL 41 02/26/2020   LDLCALC 104 (H) 02/26/2020   TRIG 151 (H) 02/26/2020   CHOLHDL 4.2 02/26/2020    Significant Diagnostic Results in last 30 days:  No results found.  Assessment/Plan: Alzheimer's dementia without behavioral disturbance ([place]) increased confusion, exit seeking, wandering. Did tolerate memory meds, underwent neurology eval, will MMSE, CBC/diff, CMP/eGFR.   Depression, recurrent ([place]) increased confusion, exit seeking, wandering. Will increase Sertraline to 11m qd, update CBC/diff, CMP/eGFR.   CORONARY ATHEROSCLEROSIS NATIVE CORONARY ARTERY CAD, NSTEMI stent f/u cardiology. Hospital stay 03/11/20-03/16/20 for NSTEMI, troponin 300s at ED, Cath showed LAD stenosis, underwent drug eluting  stent, EF 55-60%, takes Atorvastatin, ASA, Plavix, Metoprolol. Added Isosorbide 119msince last chest pain 05/01/20 AI, EF 55-60%   Essential hypertension, benign Blood pressure is controlled, continue Metoprolol.   GERD without esophagitis  takes Pantoprazole, Hgb 11.6 06/18/20   CKD (chronic kidney disease) stage 3, GFR 30-59 ml/min (HCC) Bun/creat 27/1.9 06/18/20  Hereditary and idiopathic peripheral neuropathy Stable, ambulating w/o pain, continue Gabapentin.   Iron deficiency anemia Stable, Hgb 11.6 06/18/20.     Family/ staff Communication: plan of care reviewed with the patient and charge nurse.   Labs/tests ordered:  CBC/diff, CMP/eGFR  Time spend 40 minutes.

## 2020-08-31 NOTE — Assessment & Plan Note (Signed)
increased confusion, exit seeking, wandering. Did tolerate memory meds, underwent neurology eval, will MMSE, CBC/diff, CMP/eGFR.

## 2020-08-31 NOTE — Assessment & Plan Note (Signed)
increased confusion, exit seeking, wandering. Will increase Sertraline to 158m qd, update CBC/diff, CMP/eGFR.

## 2020-09-01 ENCOUNTER — Encounter: Payer: Self-pay | Admitting: Nurse Practitioner

## 2020-09-01 DIAGNOSIS — Z9181 History of falling: Secondary | ICD-10-CM | POA: Diagnosis not present

## 2020-09-01 DIAGNOSIS — I10 Essential (primary) hypertension: Secondary | ICD-10-CM | POA: Diagnosis not present

## 2020-09-01 DIAGNOSIS — R2681 Unsteadiness on feet: Secondary | ICD-10-CM | POA: Diagnosis not present

## 2020-09-01 DIAGNOSIS — M6281 Muscle weakness (generalized): Secondary | ICD-10-CM | POA: Diagnosis not present

## 2020-09-01 LAB — BASIC METABOLIC PANEL
BUN: 28 — AB (ref 4–21)
CO2: 27 — AB (ref 13–22)
Chloride: 108 (ref 99–108)
Creatinine: 1.8 — AB (ref 0.6–1.3)
Glucose: 82
Potassium: 4.1 (ref 3.4–5.3)
Sodium: 144 (ref 137–147)

## 2020-09-01 LAB — CBC AND DIFFERENTIAL
HCT: 32 — AB (ref 41–53)
Hemoglobin: 10.4 — AB (ref 13.5–17.5)
Neutrophils Absolute: 5809
Platelets: 111 — AB (ref 150–399)
WBC: 7.4

## 2020-09-01 LAB — HEPATIC FUNCTION PANEL
ALT: 8 — AB (ref 10–40)
AST: 12 — AB (ref 14–40)
Alkaline Phosphatase: 71 (ref 25–125)
Bilirubin, Total: 0.6

## 2020-09-01 LAB — COMPREHENSIVE METABOLIC PANEL
Albumin: 4.2 (ref 3.5–5.0)
Calcium: 8.8 (ref 8.7–10.7)
Globulin: 2.4

## 2020-09-01 LAB — CBC: RBC: 3.44 — AB (ref 3.87–5.11)

## 2020-09-01 NOTE — Assessment & Plan Note (Addendum)
CAD, NSTEMI stent f/u cardiology. Hospital stay 03/11/20-03/16/20 for NSTEMI, troponin 300s at ED, Cath showed LAD stenosis, underwent drug eluting stent, EF 55-60%, takes Atorvastatin, ASA, Plavix, Metoprolol. Added Isosorbide 15mg  since last chest pain 05/01/20 AI, EF 55-60%

## 2020-09-01 NOTE — Assessment & Plan Note (Signed)
Bun/creat 27/1.9 06/18/20

## 2020-09-01 NOTE — Assessment & Plan Note (Signed)
Stable, ambulating w/o pain, continue Gabapentin.

## 2020-09-01 NOTE — Assessment & Plan Note (Signed)
Stable, Hgb 11.6 06/18/20.

## 2020-09-01 NOTE — Assessment & Plan Note (Signed)
Blood pressure is controlled, continue Metoprolol. 

## 2020-09-01 NOTE — Assessment & Plan Note (Signed)
takes Pantoprazole, Hgb 11.6 06/18/20

## 2020-09-02 DIAGNOSIS — M6281 Muscle weakness (generalized): Secondary | ICD-10-CM | POA: Diagnosis not present

## 2020-09-02 DIAGNOSIS — R2681 Unsteadiness on feet: Secondary | ICD-10-CM | POA: Diagnosis not present

## 2020-09-02 DIAGNOSIS — Z9181 History of falling: Secondary | ICD-10-CM | POA: Diagnosis not present

## 2020-09-04 DIAGNOSIS — Z9181 History of falling: Secondary | ICD-10-CM | POA: Diagnosis not present

## 2020-09-04 DIAGNOSIS — M6281 Muscle weakness (generalized): Secondary | ICD-10-CM | POA: Diagnosis not present

## 2020-09-04 DIAGNOSIS — R2681 Unsteadiness on feet: Secondary | ICD-10-CM | POA: Diagnosis not present

## 2020-09-16 ENCOUNTER — Encounter: Payer: Medicare Other | Admitting: Internal Medicine

## 2020-11-03 ENCOUNTER — Non-Acute Institutional Stay: Payer: Medicare Other | Admitting: Internal Medicine

## 2020-11-03 ENCOUNTER — Encounter: Payer: Self-pay | Admitting: Internal Medicine

## 2020-11-03 DIAGNOSIS — I10 Essential (primary) hypertension: Secondary | ICD-10-CM

## 2020-11-03 DIAGNOSIS — E785 Hyperlipidemia, unspecified: Secondary | ICD-10-CM

## 2020-11-03 DIAGNOSIS — K625 Hemorrhage of anus and rectum: Secondary | ICD-10-CM

## 2020-11-03 DIAGNOSIS — F339 Major depressive disorder, recurrent, unspecified: Secondary | ICD-10-CM | POA: Diagnosis not present

## 2020-11-03 DIAGNOSIS — G609 Hereditary and idiopathic neuropathy, unspecified: Secondary | ICD-10-CM

## 2020-11-03 DIAGNOSIS — N183 Chronic kidney disease, stage 3 unspecified: Secondary | ICD-10-CM

## 2020-11-03 DIAGNOSIS — D500 Iron deficiency anemia secondary to blood loss (chronic): Secondary | ICD-10-CM | POA: Diagnosis not present

## 2020-11-03 DIAGNOSIS — G309 Alzheimer's disease, unspecified: Secondary | ICD-10-CM | POA: Diagnosis not present

## 2020-11-03 DIAGNOSIS — K219 Gastro-esophageal reflux disease without esophagitis: Secondary | ICD-10-CM

## 2020-11-03 DIAGNOSIS — F0280 Dementia in other diseases classified elsewhere without behavioral disturbance: Secondary | ICD-10-CM | POA: Diagnosis not present

## 2020-11-03 DIAGNOSIS — I214 Non-ST elevation (NSTEMI) myocardial infarction: Secondary | ICD-10-CM

## 2020-11-03 DIAGNOSIS — R300 Dysuria: Secondary | ICD-10-CM | POA: Diagnosis not present

## 2020-11-03 NOTE — Progress Notes (Signed)
Location:   [place] Room Number: [place] of Service:  ALF [phone]) Provider:  Veleta Miners MD  Virgie Dad, MD  Patient Care Team: Virgie Dad, MD as PCP - General (Internal Medicine) Elouise Munroe, MD as PCP - Cardiology (Cardiology)  Extended Emergency Contact Information Primary Emergency Contact: Weeks,[name] Address: [address]          [place], [place] [address] [name] of [place] Phone: [phone] Mobile Phone: [phone] Relation: Daughter Secondary Emergency Contact: [name], [place] Mobile Phone: [phone] Relation: Daughter  Code Status:  Full Code Goals of care: Advanced Directive information Advanced Directives 06/17/2020  Does Patient Have a Medical Advance Directive? Yes  Type of Paramedic of [place];Living will  Does patient want to make changes to medical advance directive? No - Patient declined  Copy of [place] in Chart? Yes - validated most recent copy scanned in chart (See row information)  Would patient like information on creating a medical advance directive? -     Chief Complaint  Patient presents with  . Acute Visit    Rectal Bleeding   . Medical Management of Chronic Issues    HPI:  Pt is a 87 y.o. male seen today for an acute visit for ? Rectal Bleeding and Medical management   Has h/o Hypertension,HLD,  h/o CAD, Moderate AI, Chronic CKD stage 3, Alzheimer's Dementia  Admitted from 9/29-10/4 for NSTEMI, and Renal Insufficiency,   Patient this morning told the nurse that he had episode of Rectal bleeding Nurse also saw some blood on his sheets But when I went to see him he denied it and said it never happened Per his daughter he has been more confused lately.Also has c/o Dysuria No abdominal pain No Fever  Walks with his walker no falls no other issues Weight stable  Past Medical  History:  Diagnosis Date  . Anemia   . Arthritis    arthritis,osteopenia,"spinal stenosis"  . Bladder neck obstruction 03/28/2011  . CAD (coronary artery disease)   . Cancer [facility]) 12-06-12   Prostate cancer'98  . CKD (chronic kidney disease) stage 3, GFR 30-59 ml/min (HCC) 08/17/2017  . Depression   . Essential hypertension, benign 04/10/2009  . GERD (gastroesophageal reflux disease)   . GERD without esophagitis 11/05/2016  . H/O hiatal hernia   . H/O vertigo 12-06-12   none recent  . Hyperlipidemia   . IBS (irritable bowel syndrome)   . Mixed hyperlipidemia 04/10/2009  . Neuropathy   . Osteopenia   . Peripheral neuropathy 12-06-12   peripheral neuropathy  . Rhinitis   . RLS (restless legs syndrome)   . Vascular dementia ([place]) 09/30/2019  . Vitamin B12 deficiency    Past Surgical History:  Procedure Laterality Date  . APPENDECTOMY    . CARPAL TUNNEL RELEASE     both hands  . cataract surgery Left 12-06-12   recent surgery  . CHOLECYSTECTOMY    . COLONOSCOPY WITH PROPOFOL N/A 12/25/2012   Procedure: COLONOSCOPY WITH PROPOFOL;  Surgeon: Garlan Fair, MD;  [facility];  Service: Endoscopy;  Laterality: N/A;  . CORONARY STENT INTERVENTION N/A 03/13/2020   Procedure: CORONARY STENT INTERVENTION;  Surgeon: Nelva Bush, MD;  Location: [place] CV LAB;  Service: Cardiovascular;  Laterality: N/A;  . ESOPHAGOGASTRODUODENOSCOPY (EGD) WITH PROPOFOL N/A 12/25/2012   Procedure: ESOPHAGOGASTRODUODENOSCOPY (EGD) WITH PROPOFOL;  Surgeon: Garlan Fair, MD;  Location: [facility];  Service: Endoscopy;  Laterality: N/A;  . INTRAVASCULAR ULTRASOUND/IVUS N/A 03/13/2020   Procedure: Intravascular Ultrasound/IVUS;  Surgeon: Nelva Bush, MD;  Location: [place] CV LAB;  Service: Cardiovascular;  Laterality: N/A;  . LEFT HEART CATH AND CORONARY ANGIOGRAPHY N/A 03/13/2020   Procedure: LEFT HEART CATH AND CORONARY ANGIOGRAPHY;  Surgeon: Nelva Bush, MD;  Location: [place] CV LAB;  Service: Cardiovascular;  Laterality: N/A;  . PROSTATE SURGERY  12-06-12  . TONSILLECTOMY    . TOTAL KNEE ARTHROPLASTY Right 11/25/2014   Procedure: RIGHT TOTAL KNEE ARTHROPLASTY;  Surgeon: Melrose Nakayama, MD;  Location: [facility];  Service: Orthopedics;  Laterality: Right;    Allergies  Allergen Reactions  . Niacin Itching  . Penicillins Other (See Comments)    "rash"   . Tramadol Itching    Allergies as of 11/03/2020      Reactions   Niacin Itching   Penicillins Other (See Comments)   "rash"   Tramadol Itching      Medication List       Accurate as of Nov 03, 2020 11:59 PM. If you have any questions, ask your nurse or doctor.        aspirin EC 81 MG tablet Take 81 mg by mouth daily. Swallow whole.   atorvastatin 80 MG tablet Commonly known as: LIPITOR Take 1 tablet (80 mg total) by mouth daily.   clopidogrel 75 MG tablet Commonly known as: PLAVIX Take 1 tablet (75 mg total) by mouth daily with breakfast.   gabapentin 400 MG capsule Commonly known as: NEURONTIN Take 300 mg by mouth 3 (three) times daily.   Iron (Ferrous Sulfate) 325 (65 Fe) MG Tabs Take 1 tablet by mouth. Mon and Fri   isosorbide mononitrate 30 MG 24 hr tablet Commonly known as: IMDUR Take 30 mg by mouth daily. Take 1/2 tab =15 mg, oral, Once A Day, **HOLD IF SBP IS LESS THAN 100**   lactose free nutrition Liqd Take 237 mLs by mouth daily.   metoprolol tartrate 25 MG tablet Commonly known as: LOPRESSOR Take 12.5 mg by mouth 2 (two) times daily.   nitroGLYCERIN 0.4 MG SL tablet Commonly known as: NITROSTAT Place 1 tablet (0.4 mg total) under the tongue every 5 (five) minutes as needed for chest pain.   pantoprazole 40 MG tablet Commonly known as: PROTONIX Take 40 mg by mouth 2 (two) times daily.   sertraline 25 MG tablet Commonly known as: ZOLOFT Take 25 mg by mouth daily. Take with 100 mg tab to total 125 mg daily.   sertraline 100 MG tablet Commonly known as:  ZOLOFT 1 tablet Daily for Mood   Vitamin D 50 MCG (2000 UT) tablet Take 1 pill daily.       Review of Systems  Unable to perform ROS: Dementia    Immunization History  Administered Date(s) Administered  . Influenza, High Dose Seasonal PF 03/05/2015, 03/05/2018  . Influenza-Unspecified 04/22/2016, 03/13/2017, 06/13/2018, 03/28/2019  . Moderna Sars-Covid-2 Vaccination 06/17/2019, 07/15/2019, 04/21/2020  . Pneumococcal Conjugate-13 05/23/2017  . Pneumococcal Polysaccharide-23 01/20/2016   Pertinent  Health Maintenance Due  Topic Date Due  . INFLUENZA VACCINE  01/11/2021  . PNA vac Low Risk Adult  Completed   Fall Risk  02/26/2020 09/09/2019 02/14/2019 07/26/2018 12/03/2017  Falls in the past year? 0 0 0 0 No  Number falls in past yr: 0 - 0 - -  Injury with Fall? 0 - 0 - -  Risk Factor Category  - - - - -  Risk for fall due to : No Fall Risks No Fall Risks - - -  Follow up Falls evaluation completed;Falls prevention discussed Falls prevention discussed;Education provided;Falls evaluation completed - - -   Functional Status Survey:    Vitals:   11/03/20 1428  BP: 104/60  Pulse: 62  Resp: 18  Temp: 97.9 F (36.6 C)  SpO2: 97%  Weight: 160 lb (72.6 kg)  Height: 6' (1.829 m)   Body mass index is 21.7 kg/m. Physical Exam Constitutional:  Well-developed and well-nourished.  HENT:  Head: Normocephalic.  Mouth/Throat: Oropharynx is clear and moist.  Eyes: Pupils are equal, round, and reactive to light.  Neck: Neck supple.  Cardiovascular: Normal rate and normal heart sounds.  No murmur heard. Pulmonary/Chest: Effort normal and breath sounds normal. No respiratory distress. No wheezes. She has no rales.  Abdominal: Soft. Bowel sounds are normal. No distension. There is no tenderness. There is no rebound.  Musculoskeletal: No edema.  Lymphadenopathy: none Neurological: No Deficits Skin: Skin is warm and dry.  Psychiatric: Normal mood and affect. Behavior is normal.  Thought content normal.   Labs reviewed: Recent Labs    02/26/20 1620 03/11/20 2307 03/15/20 0102 03/16/20 0120 03/20/20 0000 05/01/20 2220 06/18/20 0000 09/01/20 0000  NA 143   < > 138 139   < > 141 144 144  K 5.0   < > 4.0 4.5   < > 4.9 3.7 4.1  CL 106   < > 107 108   < > 111 105 108  CO2 30   < > 23 24   < > 21* 30* 27*  GLUCOSE 113*   < > 113* 106*  --  114*  --   --   BUN 29*   < > 21 24*   < > 32* 27* 28*  CREATININE 1.84*   < > 1.88* 2.08*   < > 1.82* 1.9* 1.8*  CALCIUM 9.6   < > 8.4* 8.6*   < > 8.5* 9.4 8.8  MG 2.3  --   --   --   --   --   --   --    < > = values in this interval not displayed.   Recent Labs    11/12/19 1536 02/26/20 1620 03/20/20 0000 06/18/20 0000 09/01/20 0000  AST 11 13 13* 12* 12*  ALT 7* 8* 11 9* 8*  ALKPHOS  --   --  53 82 71  BILITOT 0.5 0.6  --   --   --   PROT 6.2 6.6  --   --   --   ALBUMIN  --   --  3.4* 4.6 4.2   Recent Labs    03/14/20 0059 03/15/20 0102 03/20/20 0000 05/01/20 2220 06/18/20 0000 09/01/20 0000  WBC 8.3 7.5 6.4 8.3 9.5 7.4  NEUTROABS  --   --  4,864.00  --  7,325.00 5,809.00  HGB 11.0* 10.5* 10.3* 8.8* 11.6* 10.4*  HCT 32.6* 30.5* 30* 27.8* 35* 32*  MCV 96.7 97.1  --  101.8*  --   --   PLT 116* 103* 99* 159 154 111*   Lab Results  Component Value Date   TSH 3.715 03/12/2020   Lab Results  Component Value Date   HGBA1C 5.1 09/10/2019   Lab Results  Component Value Date   CHOL 171 02/26/2020   HDL 41 02/26/2020   LDLCALC 104 (H) 02/26/2020  TRIG 151 (H) 02/26/2020   CHOLHDL 4.2 02/26/2020    Significant Diagnostic Results in last 30 days:  No results found.  Assessment/Plan Rectal bleed Now patient is denying it Per nurses they did see some blood on his Sheets Will check CBC He is on Iron Has had GI work up before  Will continue to monitor Dysuria per his Daughter Will check UA Alzheimer's dementia without behavioral disturbance, unspecified timing of dementia onset ([place]) Has  failed Aricept due to GI effects ? Try Namenda Will d/w POA Also Repeat MMSE Depression, recurrent ([place]) On Zoloft Mood stable Essential hypertension, benign On Lopressor GERD without esophagitis Continue Protonix Stage 3 chronic kidney disease, 3 b Creat Stable  Hereditary and idiopathic peripheral neuropathy Continue Neurontin Iron deficiency anemia due to chronic blood loss On Iron Has had Colon and EGD before  NSTEMI (non-ST elevated myocardial infarction) ([place]) On DAPT till 10/22 Also on Beta Blocker and Statin Also on Imdur  Hyperlipidemia,  LDL 58 in 11/21   Family/ staff Communication:   Labs/tests ordered:  CBC,UA

## 2020-11-04 DIAGNOSIS — N390 Urinary tract infection, site not specified: Secondary | ICD-10-CM | POA: Diagnosis not present

## 2020-11-04 DIAGNOSIS — D649 Anemia, unspecified: Secondary | ICD-10-CM | POA: Diagnosis not present

## 2020-11-04 LAB — CBC: RBC: 3.39 — AB (ref 3.87–5.11)

## 2020-11-04 LAB — CBC AND DIFFERENTIAL
HCT: 32 — AB (ref 41–53)
Hemoglobin: 10.1 — AB (ref 13.5–17.5)
Platelets: 102 — AB (ref 150–399)
WBC: 6.7

## 2020-11-10 DIAGNOSIS — Z23 Encounter for immunization: Secondary | ICD-10-CM | POA: Diagnosis not present

## 2020-11-12 ENCOUNTER — Encounter: Payer: Self-pay | Admitting: Internal Medicine

## 2020-11-12 ENCOUNTER — Non-Acute Institutional Stay: Payer: Medicare Other | Admitting: Internal Medicine

## 2020-11-12 DIAGNOSIS — R269 Unspecified abnormalities of gait and mobility: Secondary | ICD-10-CM

## 2020-11-12 DIAGNOSIS — T148XXA Other injury of unspecified body region, initial encounter: Secondary | ICD-10-CM

## 2020-11-12 DIAGNOSIS — D649 Anemia, unspecified: Secondary | ICD-10-CM | POA: Diagnosis not present

## 2020-11-12 DIAGNOSIS — I214 Non-ST elevation (NSTEMI) myocardial infarction: Secondary | ICD-10-CM

## 2020-11-12 DIAGNOSIS — R509 Fever, unspecified: Secondary | ICD-10-CM

## 2020-11-12 DIAGNOSIS — R296 Repeated falls: Secondary | ICD-10-CM

## 2020-11-12 DIAGNOSIS — I10 Essential (primary) hypertension: Secondary | ICD-10-CM

## 2020-11-12 DIAGNOSIS — G609 Hereditary and idiopathic neuropathy, unspecified: Secondary | ICD-10-CM | POA: Diagnosis not present

## 2020-11-12 NOTE — Progress Notes (Signed)
Location: [place] Room Number: 907 Place of Service:  ALF [phone])  Provider: Veleta Miners MD  Code Status: Full Code Goals of Care:  Advanced Directives 06/17/2020  Does Patient Have a Medical Advance Directive? Yes  Type of Paramedic of [place];Living will  Does patient want to make changes to medical advance directive? No - Patient declined  Copy of [place] in Chart? Yes - validated most recent copy scanned in chart (See row information)  Would patient like information on creating a medical advance directive? -     Chief Complaint  Patient presents with  . Acute Visit    Fall    HPI: Patient is a 87 y.o. male seen today for an acute visit for recurent falls and Big Bruise   Has h/o Hypertension,HLD,  h/o CAD, Moderate AI, Chronic CKD stage 3, Alzheimer's Dementia  Admitted from 9/29-10/4 for NSTEMI, and Renal Insufficiency  Patient was seen today as he has got sick  for had 3 falls in past 2 days When I went to see him in his room he was on the floor in his bathroom. Patient stated that he just loses his balance and falls.  Denies any dizziness. Denies any chest pain to me no shortness of breath no dysuria Unable to get much history due to  dementia  He got COVID booster  yesterday.  And has had low-grade temp of 100   Past Medical History:  Diagnosis Date  . Anemia   . Arthritis    arthritis,osteopenia,"spinal stenosis"  . Bladder neck obstruction 03/28/2011  . CAD (coronary artery disease)   . Cancer [facility]) 12-06-12   Prostate cancer'98  . CKD (chronic kidney disease) stage 3, GFR 30-59 ml/min (HCC) 08/17/2017  . Depression   . Essential hypertension, benign 04/10/2009  . GERD (gastroesophageal reflux disease)   . GERD without esophagitis 11/05/2016  . H/O hiatal hernia   . H/O vertigo 12-06-12   none recent  . Hyperlipidemia   . IBS (irritable bowel syndrome)   . Mixed hyperlipidemia  04/10/2009  . Neuropathy   . Osteopenia   . Peripheral neuropathy 12-06-12   peripheral neuropathy  . Rhinitis   . RLS (restless legs syndrome)   . Vascular dementia ([place]) 09/30/2019  . Vitamin B12 deficiency     Past Surgical History:  Procedure Laterality Date  . APPENDECTOMY    . CARPAL TUNNEL RELEASE     both hands  . cataract surgery Left 12-06-12   recent surgery  . CHOLECYSTECTOMY    . COLONOSCOPY WITH PROPOFOL N/A 12/25/2012   Procedure: COLONOSCOPY WITH PROPOFOL;  Surgeon: Garlan Fair, MD;  [facility];  Service: Endoscopy;  Laterality: N/A;  . CORONARY STENT INTERVENTION N/A 03/13/2020   Procedure: CORONARY STENT INTERVENTION;  Surgeon: Nelva Bush, MD;  Location: [place] CV LAB;  Service: Cardiovascular;  Laterality: N/A;  . ESOPHAGOGASTRODUODENOSCOPY (EGD) WITH PROPOFOL N/A 12/25/2012   Procedure: ESOPHAGOGASTRODUODENOSCOPY (EGD) WITH PROPOFOL;  Surgeon: Garlan Fair, MD;  [facility];  Service: Endoscopy;  Laterality: N/A;  . INTRAVASCULAR ULTRASOUND/IVUS N/A 03/13/2020   Procedure: Intravascular Ultrasound/IVUS;  Surgeon: Nelva Bush, MD;  Location: [place] CV LAB;  Service: Cardiovascular;  Laterality: N/A;  . LEFT HEART CATH AND CORONARY ANGIOGRAPHY N/A 03/13/2020   Procedure: LEFT HEART CATH AND CORONARY ANGIOGRAPHY;  Surgeon: Nelva Bush, MD;  Location: [place] CV LAB;  Service: Cardiovascular;  Laterality: N/A;  . PROSTATE SURGERY  12-06-12  . TONSILLECTOMY    . TOTAL KNEE ARTHROPLASTY Right 11/25/2014   Procedure: RIGHT TOTAL KNEE ARTHROPLASTY;  Surgeon: Melrose Nakayama, MD;  Location: [place];  Service: Orthopedics;  Laterality: Right;    Allergies  Allergen Reactions  . Niacin Itching  . Penicillins Other (See Comments)    "rash"   . Tramadol Itching    Outpatient Encounter Medications as of 11/12/2020  Medication Sig  . aspirin EC 81 MG tablet Take 81 mg by mouth daily. Swallow whole.  Marland Kitchen atorvastatin  (LIPITOR) 80 MG tablet Take 1 tablet (80 mg total) by mouth daily.  . Cholecalciferol (VITAMIN D) 50 MCG (2000 UT) tablet Take 1 pill daily.  . clopidogrel (PLAVIX) 75 MG tablet Take 1 tablet (75 mg total) by mouth daily with breakfast.  . gabapentin (NEURONTIN) 300 MG capsule Take 300 mg by mouth 3 (three) times daily.  . Iron, Ferrous Sulfate, 325 (65 Fe) MG TABS Take 1 tablet by mouth. Mon and Fri  . isosorbide mononitrate (IMDUR) 30 MG 24 hr tablet Take 30 mg by mouth daily. Take 1/2 tab =15 mg, oral, Once A Day, **HOLD IF SBP IS LESS THAN 100**  . lactose free nutrition (BOOST) LIQD Take 237 mLs by mouth daily.  . metoprolol tartrate (LOPRESSOR) 25 MG tablet Take 12.5 mg by mouth 2 (two) times daily.  . nitroGLYCERIN (NITROSTAT) 0.4 MG SL tablet Place 1 tablet (0.4 mg total) under the tongue every 5 (five) minutes as needed for chest pain.  . pantoprazole (PROTONIX) 40 MG tablet Take 40 mg by mouth 2 (two) times daily.  . sertraline (ZOLOFT) 100 MG tablet 1 tablet Daily for Mood  . sertraline (ZOLOFT) 25 MG tablet Take 25 mg by mouth daily. Take with 100 mg tab to total 125 mg daily.   No facility-administered encounter medications on file as of 11/12/2020.    Review of Systems:  Review of Systems  Unable to perform ROS: Dementia    Health Maintenance  Topic Date Due  . TETANUS/TDAP  Never done  . Zoster Vaccines- Shingrix (1 of 2) Never done  . COVID-19 Vaccine (4 - Booster for Moderna series) 07/22/2020  . INFLUENZA VACCINE  01/11/2021  . PNA vac Low Risk Adult  Completed  . HPV VACCINES  Aged Out    Physical Exam: Vitals:   11/12/20 1312  BP: 118/60  Pulse: 93  Temp: 99.5 F (37.5 C)  SpO2: 93%  Weight: 160 lb (72.6 kg)  Height: 6' (1.829 m)   Body mass index is 21.7 kg/m. Physical Exam Vitals reviewed.  Constitutional:      Appearance: Normal appearance.  HENT:     Head: Normocephalic.     Nose: Nose normal.     Mouth/Throat:     Mouth: Mucous membranes are  moist.     Pharynx: Oropharynx is clear.  Eyes:     Pupils: Pupils are equal, round, and reactive to light.  Cardiovascular:     Rate and Rhythm: Regular rhythm. Tachycardia present.     Pulses: Normal pulses.  Pulmonary:     Effort: Pulmonary effort is normal.     Breath sounds: Normal breath sounds.  Abdominal:     General: Abdomen is flat. Bowel sounds are normal.     Palpations: Abdomen is soft.  Musculoskeletal:        General: No swelling.     Cervical back: Neck supple.  Skin:    Comments: Big Bruise in his Left Thigh  Area  Neurological:     General: No focal deficit present.     Mental Status: He is alert.     Comments: Alert Mental status at baseline When I tried to stand him he did but was feeling weak  Psychiatric:        Mood and Affect: Mood normal.        Thought Content: Thought content normal.     Labs reviewed: Basic Metabolic Panel: Recent Labs    02/26/20 1620 03/11/20 2307 03/12/20 2317 03/13/20 0045 03/15/20 0102 03/16/20 0120 03/20/20 0000 05/01/20 2220 06/18/20 0000 09/01/20 0000  NA 143   < >  --    < > 138 139   < > 141 144 144  K 5.0   < >  --    < > 4.0 4.5   < > 4.9 3.7 4.1  CL 106   < >  --    < > 107 108   < > 111 105 108  CO2 30   < >  --    < > 23 24   < > 21* 30* 27*  GLUCOSE 113*   < >  --    < > 113* 106*  --  114*  --   --   BUN 29*   < >  --    < > 21 24*   < > 32* 27* 28*  CREATININE 1.84*   < >  --    < > 1.88* 2.08*   < > 1.82* 1.9* 1.8*  CALCIUM 9.6   < >  --    < > 8.4* 8.6*   < > 8.5* 9.4 8.8  MG 2.3  --   --   --   --   --   --   --   --   --   TSH 2.42  --  3.715  --   --   --   --   --   --   --    < > = values in this interval not displayed.   Liver Function Tests: Recent Labs    02/26/20 1620 03/20/20 0000 06/18/20 0000 09/01/20 0000  AST 13 13* 12* 12*  ALT 8* 11 9* 8*  ALKPHOS  --  53 82 71  BILITOT 0.6  --   --   --   PROT 6.6  --   --   --   ALBUMIN  --  3.4* 4.6 4.2   No results for input(s):  LIPASE, AMYLASE in the last 8760 hours. No results for input(s): AMMONIA in the last 8760 hours. CBC: Recent Labs    03/14/20 0059 03/15/20 0102 03/15/20 0102 03/20/20 0000 05/01/20 2220 06/18/20 0000 09/01/20 0000 11/04/20 0000  WBC 8.3 7.5   < > 6.4 8.3 9.5 7.4 6.7  NEUTROABS  --   --   --  4,864.00  --  7,325.00 5,809.00  --   HGB 11.0* 10.5*  --  10.3* 8.8* 11.6* 10.4* 10.1*  HCT 32.6* 30.5*  --  30* 27.8* 35* 32* 32*  MCV 96.7 97.1  --   --  101.8*  --   --   --   PLT 116* 103*  --  99* 159 154 111* 102*   < > = values in this interval not displayed.   Lipid Panel: Recent Labs    02/26/20 1620  CHOL 171  HDL 41  LDLCALC 104*  TRIG 151*  CHOLHDL  4.2   Lab Results  Component Value Date   HGBA1C 5.1 09/10/2019    Procedures since last visit: No results found.  Assessment/Plan Recurrent falls Check BMP and CBC HGB  few days ago showed hgb 10.1 with Normal White Count Discontinue Lopressor to avoid Hypotension Therapy to evaluate and Treat Also Change Neurontin to 200 mg TID  Addendum HGB came back 8.4 Drop from 10.1 Most likely due to his bleed in his thigh. Also Check Stool for Occult Change his Iron to 3/ week. Repeat CBC in few days Cannot stop Plavix and Aspirin due to his Stent placed in 10/21 Will d/w Cardiology if any more bleed  Low grade fever Patient just got Covid Vaccination yesterday White count normal Recent UA was negative Will continue to monitor CKD stage 3 b Creat stable Bun slightly high Encourage PO fluids  Thrombocytopenia Platelets low but stable   Bruise  Gait abnormality Therapy to evaluate NSTEMI (non-ST elevated myocardial infarction) ([place]) On DAPT for 12 months Essential hypertension, benign BP on lower side Discontinue Lopressor Hereditary and idiopathic peripheral neuropathy Change Neurontin to 200 mg TID due to Falls  Alzheimer's dementia without behavioral disturbance, unspecified timing of dementia onset  ([place]) Has failed Aricept due to GI effects Will Hold Namenda due to these Falls   Labs/tests ordered:  CBC,CMP in 3 days   Total time spent in this patient care encounter was  _45  minutes; greater than 50% of the visit spent counseling patient and staff, reviewing records , Labs and coordinating care for problems addressed at this encounter.

## 2020-11-17 DIAGNOSIS — I10 Essential (primary) hypertension: Secondary | ICD-10-CM | POA: Diagnosis not present

## 2020-11-17 DIAGNOSIS — D649 Anemia, unspecified: Secondary | ICD-10-CM | POA: Diagnosis not present

## 2020-11-18 ENCOUNTER — Encounter: Payer: Self-pay | Admitting: Nurse Practitioner

## 2020-11-18 ENCOUNTER — Non-Acute Institutional Stay: Payer: Medicare Other | Admitting: Nurse Practitioner

## 2020-11-18 DIAGNOSIS — K219 Gastro-esophageal reflux disease without esophagitis: Secondary | ICD-10-CM | POA: Diagnosis not present

## 2020-11-18 DIAGNOSIS — G609 Hereditary and idiopathic neuropathy, unspecified: Secondary | ICD-10-CM | POA: Diagnosis not present

## 2020-11-18 DIAGNOSIS — D500 Iron deficiency anemia secondary to blood loss (chronic): Secondary | ICD-10-CM

## 2020-11-18 DIAGNOSIS — F0280 Dementia in other diseases classified elsewhere without behavioral disturbance: Secondary | ICD-10-CM | POA: Diagnosis not present

## 2020-11-18 DIAGNOSIS — I2510 Atherosclerotic heart disease of native coronary artery without angina pectoris: Secondary | ICD-10-CM | POA: Diagnosis not present

## 2020-11-18 DIAGNOSIS — I10 Essential (primary) hypertension: Secondary | ICD-10-CM

## 2020-11-18 DIAGNOSIS — G309 Alzheimer's disease, unspecified: Secondary | ICD-10-CM | POA: Diagnosis not present

## 2020-11-18 DIAGNOSIS — N1832 Chronic kidney disease, stage 3b: Secondary | ICD-10-CM

## 2020-11-18 DIAGNOSIS — F339 Major depressive disorder, recurrent, unspecified: Secondary | ICD-10-CM | POA: Diagnosis not present

## 2020-11-18 LAB — BASIC METABOLIC PANEL
BUN: 30 — AB (ref 4–21)
CO2: 24 — AB (ref 13–22)
Chloride: 106 (ref 99–108)
Creatinine: 1.6 — AB (ref 0.6–1.3)
Glucose: 85
Potassium: 4.1 (ref 3.4–5.3)
Sodium: 142 (ref 137–147)

## 2020-11-18 LAB — CBC AND DIFFERENTIAL
HCT: 25 — AB (ref 41–53)
Hemoglobin: 8 — AB (ref 13.5–17.5)
Neutrophils Absolute: 4801
Platelets: 138 — AB (ref 150–399)
WBC: 6.1

## 2020-11-18 LAB — COMPREHENSIVE METABOLIC PANEL
Calcium: 8.9 (ref 8.7–10.7)
GFR calc Af Amer: 44
GFR calc non Af Amer: 38

## 2020-11-18 LAB — CBC: RBC: 2.67 — AB (ref 3.87–5.11)

## 2020-11-18 NOTE — Assessment & Plan Note (Signed)
His mood is stable, continue Sertraline.

## 2020-11-18 NOTE — Assessment & Plan Note (Signed)
controlled, off Metoprolol.

## 2020-11-18 NOTE — Assessment & Plan Note (Signed)
Stable. Peripheral neuropathy, reduced Gabapentin 200mg  tid since 11/12/20 after fall.

## 2020-11-18 NOTE — Progress Notes (Signed)
Location:   [place] Room Number: [phone] Place of Service:  ALF [phone]) Provider:  Tylyn Derwin Otho Darner, NP    Patient Care Team: Virgie Dad, MD as PCP - General (Internal Medicine) Elouise Munroe, MD as PCP - Cardiology (Cardiology)  Extended Emergency Contact Information Primary Emergency Contact: Weeks,[name] Address: [address]          [place], [place] [address] [name] of [place] Phone: [phone] Mobile Phone: [phone] Relation: Daughter Secondary Emergency Contact: [name], [place] of [place] Mobile Phone: [phone] Relation: Daughter  Code Status:  FULL CODE Goals of care: Advanced Directive information Advanced Directives 11/18/2020  Does Patient Have a Medical Advance Directive? Yes  Type of Paramedic of [place];Living will  Does patient want to make changes to medical advance directive? No - Patient declined  Copy of [place] in Chart? Yes - validated most recent copy scanned in chart (See row information)  Would patient like information on creating a medical advance directive? -     Chief Complaint  Patient presents with  . Acute Visit    Patient being seen for anemia    HPI:  Pt is a 87 y.o. male seen today for an acute visit for blood loss anemia, Hgb dropped from 10.1>>8.4>>8.0 11/17/20. Massive ecchymoses arms, legs, shoulder blades, chest, sides, buttocks, but mostly in the left thigh. The patient denied pain, is able to ambulate with walker. Denied dizziness, change of vision, chest pain/pressure, palpitation, SOB, or excessive fatigue.     CAD, NSTEMI stent f/u cardiology. Hospital stay 03/11/20-03/16/20 for NSTEMI, troponin 300s at ED, Cath showed LAD stenosis, underwent drug eluting stent, EF 55-60%, takes Atorvastatin, ASA, Plavix. Added Isosorbide 15mg  since last chest pain 05/01/20 HTN, controlled, off Metoprolol.   GERD, takes Pantoprazole AI, EF 55-60% CKD stage 3,Bun/creat 30/1.6 11/18/20 Alzheimer's dementia, didn't tolerate memory meds, underwent neurology eval in the past.  Peripheral neuropathy, reduced Gabapentin 200mg  tid since 11/12/20 after fall.  Depression takes Sertraline Anemia, takes Iron, worsened since the fall/excessive ecchymoses.     Past Medical History:  Diagnosis Date  . Anemia   . Arthritis    arthritis,osteopenia,"spinal stenosis"  . Bladder neck obstruction 03/28/2011  . CAD (coronary artery disease)   . Cancer [facility]) 12-06-12   Prostate cancer'98  . CKD (chronic kidney disease) stage 3, GFR 30-59 ml/min (HCC) 08/17/2017  . Depression   . Essential hypertension, benign 04/10/2009  . GERD (gastroesophageal reflux disease)   . GERD without esophagitis 11/05/2016  . H/O hiatal hernia   . H/O vertigo 12-06-12   none recent  . Hyperlipidemia   . IBS (irritable bowel syndrome)   . Mixed hyperlipidemia 04/10/2009  . Neuropathy   . Osteopenia   . Peripheral neuropathy 12-06-12   peripheral neuropathy  . Rhinitis   . RLS (restless legs syndrome)   . Vascular dementia ([place]) 09/30/2019  . Vitamin B12 deficiency    Past Surgical History:  Procedure Laterality Date  . APPENDECTOMY    . CARPAL TUNNEL RELEASE     both hands  . cataract surgery Left 12-06-12   recent surgery  . CHOLECYSTECTOMY    . COLONOSCOPY WITH PROPOFOL N/A 12/25/2012   Procedure: COLONOSCOPY WITH PROPOFOL;  Surgeon: Garlan Fair, MD;  [facility];  Service: Endoscopy;  Laterality: N/A;  . CORONARY STENT INTERVENTION N/A 03/13/2020   Procedure: CORONARY  STENT INTERVENTION;  Surgeon: Nelva Bush, MD;  Location: [place] CV LAB;  Service: Cardiovascular;  Laterality: N/A;  . ESOPHAGOGASTRODUODENOSCOPY (EGD) WITH PROPOFOL N/A 12/25/2012   Procedure: ESOPHAGOGASTRODUODENOSCOPY (EGD) WITH PROPOFOL;   Surgeon: Garlan Fair, MD;  [facility];  Service: Endoscopy;  Laterality: N/A;  . INTRAVASCULAR ULTRASOUND/IVUS N/A 03/13/2020   Procedure: Intravascular Ultrasound/IVUS;  Surgeon: Nelva Bush, MD;  Location: [place] CV LAB;  Service: Cardiovascular;  Laterality: N/A;  . LEFT HEART CATH AND CORONARY ANGIOGRAPHY N/A 03/13/2020   Procedure: LEFT HEART CATH AND CORONARY ANGIOGRAPHY;  Surgeon: Nelva Bush, MD;  Location: [place] CV LAB;  Service: Cardiovascular;  Laterality: N/A;  . PROSTATE SURGERY  12-06-12  . TONSILLECTOMY    . TOTAL KNEE ARTHROPLASTY Right 11/25/2014   Procedure: RIGHT TOTAL KNEE ARTHROPLASTY;  Surgeon: Melrose Nakayama, MD;  Location: [place];  Service: Orthopedics;  Laterality: Right;    Allergies  Allergen Reactions  . Niacin Itching  . Penicillins Other (See Comments)    "rash"   . Tramadol Itching    Allergies as of 11/18/2020      Reactions   Niacin Itching   Penicillins Other (See Comments)   "rash"   Tramadol Itching      Medication List       Accurate as of November 18, 2020 12:32 PM. If you have any questions, ask your nurse or doctor.        aspirin EC 81 MG tablet Take 81 mg by mouth daily. Swallow whole.   atorvastatin 80 MG tablet Commonly known as: LIPITOR Take 1 tablet (80 mg total) by mouth daily.   clopidogrel 75 MG tablet Commonly known as: PLAVIX Take 1 tablet (75 mg total) by mouth daily with breakfast.   gabapentin 100 MG capsule Commonly known as: NEURONTIN Take 200 mg by mouth 3 (three) times daily.   Iron (Ferrous Sulfate) 325 (65 Fe) MG Tabs Take 1 tablet by mouth. Mon and Fri   isosorbide mononitrate 30 MG 24 hr tablet Commonly known as: IMDUR Take 30 mg by mouth daily. Take 1/2 tab =15 mg, oral, Once A Day, **HOLD IF SBP IS LESS THAN 100**   lactose free nutrition Liqd Take 237 mLs by mouth daily.   nitroGLYCERIN 0.4 MG SL tablet Commonly known as: NITROSTAT Place 1 tablet (0.4 mg total)  under the tongue every 5 (five) minutes as needed for chest pain.   pantoprazole 40 MG tablet Commonly known as: PROTONIX Take 40 mg by mouth 2 (two) times daily.   sertraline 25 MG tablet Commonly known as: ZOLOFT Take 25 mg by mouth daily. Take with 100 mg tab to total 125 mg daily.   sertraline 100 MG tablet Commonly known as: ZOLOFT 1 tablet Daily for Mood   Vitamin D 50 MCG (2000 UT) tablet Take 1 pill daily.       Review of Systems  Constitutional: Negative for activity change, appetite change, fatigue and fever.  HENT: Positive for hearing loss. Negative for congestion and voice change.   Eyes: Negative for visual disturbance.  Respiratory: Negative for cough and shortness of breath.   Cardiovascular: Positive for leg swelling. Negative for chest pain and palpitations.  Gastrointestinal: Negative for abdominal pain and constipation.  Genitourinary: Negative for dysuria, frequency and urgency.  Musculoskeletal: Positive for gait problem.  Skin: Positive for pallor. Negative for color change.  Neurological: Negative for dizziness, speech difficulty and weakness.       Memory lapses.  Psychiatric/Behavioral: Negative for behavioral problems and sleep disturbance. The patient is not nervous/anxious.     Immunization History  Administered Date(s) Administered  . Influenza, High Dose Seasonal PF 03/05/2015, 03/05/2018  . Influenza-Unspecified 04/22/2016, 03/13/2017, 06/13/2018, 03/28/2019  . Moderna Sars-Covid-2 Vaccination 06/17/2019, 07/15/2019, 04/21/2020  . Pneumococcal Conjugate-13 05/23/2017  . Pneumococcal Polysaccharide-23 01/20/2016   Pertinent  Health Maintenance Due  Topic Date Due  . INFLUENZA VACCINE  01/11/2021  . PNA vac Low Risk Adult  Completed   Fall Risk  02/26/2020 09/09/2019 02/14/2019 07/26/2018 12/03/2017  Falls in the past year? 0 0 0 0 No  Number falls in past yr: 0 - 0 - -  Injury with Fall? 0 - 0 - -  Risk Factor Category  - - - - -  Risk  for fall due to : No Fall Risks No Fall Risks - - -  Follow up Falls evaluation completed;Falls prevention discussed Falls prevention discussed;Education provided;Falls evaluation completed - - -   Functional Status Survey:    Vitals:   11/18/20 0957  BP: 120/72  Pulse: 96  Resp: 18  Temp: (!) 97.5 F (36.4 C)  SpO2: 93%  Weight: 160 lb (72.6 kg)  Height: 6' (1.829 m)   Body mass index is 21.7 kg/m. Physical Exam Vitals and nursing note reviewed.  Constitutional:      Appearance: Normal appearance.  HENT:     Head: Normocephalic and atraumatic.     Mouth/Throat:     Mouth: Mucous membranes are moist.  Eyes:     Extraocular Movements: Extraocular movements intact.     Conjunctiva/sclera: Conjunctivae normal.     Pupils: Pupils are equal, round, and reactive to light.  Cardiovascular:     Rate and Rhythm: Normal rate and regular rhythm.     Heart sounds: No murmur heard.   Pulmonary:     Effort: Pulmonary effort is normal.     Breath sounds: No wheezing.  Abdominal:     General: Bowel sounds are normal.     Palpations: Abdomen is soft.     Tenderness: There is no abdominal tenderness.  Musculoskeletal:     Cervical back: Normal range of motion and neck supple.     Right lower leg: Edema present.     Left lower leg: Edema present.     Comments: Minimal edema BLE  Skin:    General: Skin is warm and dry.     Findings: Bruising present.     Comments: Massive ecchymoses arms, legs, shoulder blades, chest, sides, buttocks, but mostly in the left thigh.   Neurological:     General: No focal deficit present.     Mental Status: He is alert. Mental status is at baseline.     Motor: No weakness.     Gait: Gait abnormal.     Comments: Oriented to person, place  Psychiatric:        Mood and Affect: Mood normal.        Behavior: Behavior normal.     Labs reviewed: Recent Labs    02/26/20 1620 03/11/20 2307 03/15/20 0102 03/16/20 0120 03/20/20 0000 05/01/20 2220  06/18/20 0000 09/01/20 0000 11/18/20 0000  NA 143   < > 138 139   < > 141 144 144 142  K 5.0   < > 4.0 4.5   < > 4.9 3.7 4.1 4.1  CL 106   < > 107 108   < > 111 105 108 106  CO2 30   < >  23 24   < > 21* 30* 27* 24*  GLUCOSE 113*   < > 113* 106*  --  114*  --   --   --   BUN 29*   < > 21 24*   < > 32* 27* 28* 30*  CREATININE 1.84*   < > 1.88* 2.08*   < > 1.82* 1.9* 1.8* 1.6*  CALCIUM 9.6   < > 8.4* 8.6*   < > 8.5* 9.4 8.8 8.9  MG 2.3  --   --   --   --   --   --   --   --    < > = values in this interval not displayed.   Recent Labs    02/26/20 1620 03/20/20 0000 06/18/20 0000 09/01/20 0000  AST 13 13* 12* 12*  ALT 8* 11 9* 8*  ALKPHOS  --  53 82 71  BILITOT 0.6  --   --   --   PROT 6.6  --   --   --   ALBUMIN  --  3.4* 4.6 4.2   Recent Labs    03/14/20 0059 03/15/20 0102 03/20/20 0000 05/01/20 2220 06/18/20 0000 09/01/20 0000 11/04/20 0000 11/18/20 0000  WBC 8.3 7.5   < > 8.3 9.5 7.4 6.7 6.1  NEUTROABS  --   --    < >  --  7,325.00 5,809.00  --  4,801.00  HGB 11.0* 10.5*   < > 8.8* 11.6* 10.4* 10.1* 8.0*  HCT 32.6* 30.5*   < > 27.8* 35* 32* 32* 25*  MCV 96.7 97.1  --  101.8*  --   --   --   --   PLT 116* 103*   < > 159 154 111* 102* 138*   < > = values in this interval not displayed.   Lab Results  Component Value Date   TSH 3.715 03/12/2020   Lab Results  Component Value Date   HGBA1C 5.1 09/10/2019   Lab Results  Component Value Date   CHOL 171 02/26/2020   HDL 41 02/26/2020   LDLCALC 104 (H) 02/26/2020   TRIG 151 (H) 02/26/2020   CHOLHDL 4.2 02/26/2020    Significant Diagnostic Results in last 30 days:  No results found.  Assessment/Plan Iron deficiency anemia blood loss anemia, Hgb dropped from 10.1>>8.4>>8.0 11/17/20. Massive ecchymoses arms, legs, shoulder blades, chest, sides, buttocks, but mostly in the left thigh. The patient denied pain, is able to ambulate with walker. Denied dizziness, change of vision, chest pain/pressure, palpitation,  SOB, or excessive fatigue.  11/17/20 FOBT negative x2 11/18/20 continue Fe, repeat CBC one week, may D/W cardiology if Hgb continuous dropping.   CORONARY ATHEROSCLEROSIS NATIVE CORONARY ARTERY CAD, NSTEMI stent f/u cardiology. Hospital stay 03/11/20-03/16/20 for NSTEMI, troponin 300s at ED, Cath showed LAD stenosis, underwent drug eluting stent, EF 55-60%, takes Atorvastatin, ASA, Plavix. Added Isosorbide 15mg  since last chest pain 05/01/20   Essential hypertension, benign controlled, off Metoprolol.    GERD without esophagitis Stable, takes Pantoprazole.   CKD (chronic kidney disease) stage 3, GFR 30-59 ml/min (HCC) Bun/creat 30/1.6 11/18/20   Alzheimer's dementia without behavioral disturbance (HCC) didn't tolerate memory meds, underwent neurology eval in the past.    Hereditary and idiopathic peripheral neuropathy Stable. Peripheral neuropathy, reduced Gabapentin 200mg  tid since 11/12/20 after fall.  Depression, recurrent ([place]) His mood is stable, continue Sertraline.      Family/ staff Communication: plan of care reviewed with the patient and charge nurse.  Labs/tests ordered:  CBC one week.   Time spend 40 minutes.

## 2020-11-18 NOTE — Assessment & Plan Note (Signed)
Stable, takes Pantoprazole 

## 2020-11-18 NOTE — Assessment & Plan Note (Signed)
Bun/creat 30/1.6 11/18/20

## 2020-11-18 NOTE — Assessment & Plan Note (Signed)
blood loss anemia, Hgb dropped from 10.1>>8.4>>8.0 11/17/20. Massive ecchymoses arms, legs, shoulder blades, chest, sides, buttocks, but mostly in the left thigh. The patient denied pain, is able to ambulate with walker. Denied dizziness, change of vision, chest pain/pressure, palpitation, SOB, or excessive fatigue.  11/17/20 FOBT negative x2 11/18/20 continue Fe, repeat CBC one week, may D/W cardiology if Hgb continuous dropping.

## 2020-11-18 NOTE — Assessment & Plan Note (Signed)
CAD, NSTEMI stent f/u cardiology. Hospital stay 03/11/20-03/16/20 for NSTEMI, troponin 300s at ED, Cath showed LAD stenosis, underwent drug eluting stent, EF 55-60%, takes Atorvastatin, ASA, Plavix. Added Isosorbide 15mg  since last chest pain 05/01/20

## 2020-11-18 NOTE — Assessment & Plan Note (Signed)
didn't tolerate memory meds, underwent neurology eval in the past.

## 2020-11-19 DIAGNOSIS — R29898 Other symptoms and signs involving the musculoskeletal system: Secondary | ICD-10-CM | POA: Diagnosis not present

## 2020-11-19 DIAGNOSIS — M6281 Muscle weakness (generalized): Secondary | ICD-10-CM | POA: Diagnosis not present

## 2020-11-19 DIAGNOSIS — R296 Repeated falls: Secondary | ICD-10-CM | POA: Diagnosis not present

## 2020-11-19 DIAGNOSIS — R2681 Unsteadiness on feet: Secondary | ICD-10-CM | POA: Diagnosis not present

## 2020-11-22 DIAGNOSIS — R296 Repeated falls: Secondary | ICD-10-CM | POA: Diagnosis not present

## 2020-11-22 DIAGNOSIS — R29898 Other symptoms and signs involving the musculoskeletal system: Secondary | ICD-10-CM | POA: Diagnosis not present

## 2020-11-22 DIAGNOSIS — M6281 Muscle weakness (generalized): Secondary | ICD-10-CM | POA: Diagnosis not present

## 2020-11-22 DIAGNOSIS — R2681 Unsteadiness on feet: Secondary | ICD-10-CM | POA: Diagnosis not present

## 2020-11-23 DIAGNOSIS — M6281 Muscle weakness (generalized): Secondary | ICD-10-CM | POA: Diagnosis not present

## 2020-11-23 DIAGNOSIS — R2681 Unsteadiness on feet: Secondary | ICD-10-CM | POA: Diagnosis not present

## 2020-11-23 DIAGNOSIS — R296 Repeated falls: Secondary | ICD-10-CM | POA: Diagnosis not present

## 2020-11-23 DIAGNOSIS — R29898 Other symptoms and signs involving the musculoskeletal system: Secondary | ICD-10-CM | POA: Diagnosis not present

## 2020-11-24 DIAGNOSIS — R29898 Other symptoms and signs involving the musculoskeletal system: Secondary | ICD-10-CM | POA: Diagnosis not present

## 2020-11-24 DIAGNOSIS — R296 Repeated falls: Secondary | ICD-10-CM | POA: Diagnosis not present

## 2020-11-24 DIAGNOSIS — M6281 Muscle weakness (generalized): Secondary | ICD-10-CM | POA: Diagnosis not present

## 2020-11-24 DIAGNOSIS — R2681 Unsteadiness on feet: Secondary | ICD-10-CM | POA: Diagnosis not present

## 2020-11-25 DIAGNOSIS — R2681 Unsteadiness on feet: Secondary | ICD-10-CM | POA: Diagnosis not present

## 2020-11-25 DIAGNOSIS — R29898 Other symptoms and signs involving the musculoskeletal system: Secondary | ICD-10-CM | POA: Diagnosis not present

## 2020-11-25 DIAGNOSIS — M6281 Muscle weakness (generalized): Secondary | ICD-10-CM | POA: Diagnosis not present

## 2020-11-25 DIAGNOSIS — R296 Repeated falls: Secondary | ICD-10-CM | POA: Diagnosis not present

## 2020-11-26 DIAGNOSIS — R29898 Other symptoms and signs involving the musculoskeletal system: Secondary | ICD-10-CM | POA: Diagnosis not present

## 2020-11-26 DIAGNOSIS — M6281 Muscle weakness (generalized): Secondary | ICD-10-CM | POA: Diagnosis not present

## 2020-11-26 DIAGNOSIS — R296 Repeated falls: Secondary | ICD-10-CM | POA: Diagnosis not present

## 2020-11-26 DIAGNOSIS — I10 Essential (primary) hypertension: Secondary | ICD-10-CM | POA: Diagnosis not present

## 2020-11-26 DIAGNOSIS — D649 Anemia, unspecified: Secondary | ICD-10-CM | POA: Diagnosis not present

## 2020-11-26 DIAGNOSIS — R2681 Unsteadiness on feet: Secondary | ICD-10-CM | POA: Diagnosis not present

## 2020-11-26 LAB — CBC AND DIFFERENTIAL
HCT: 29 — AB (ref 41–53)
Hemoglobin: 9.3 — AB (ref 13.5–17.5)
Platelets: 141 — AB (ref 150–399)
WBC: 7.4

## 2020-11-26 LAB — CBC: RBC: 3.04 — AB (ref 3.87–5.11)

## 2020-11-27 DIAGNOSIS — R2681 Unsteadiness on feet: Secondary | ICD-10-CM | POA: Diagnosis not present

## 2020-11-27 DIAGNOSIS — R296 Repeated falls: Secondary | ICD-10-CM | POA: Diagnosis not present

## 2020-11-27 DIAGNOSIS — R29898 Other symptoms and signs involving the musculoskeletal system: Secondary | ICD-10-CM | POA: Diagnosis not present

## 2020-11-27 DIAGNOSIS — M6281 Muscle weakness (generalized): Secondary | ICD-10-CM | POA: Diagnosis not present

## 2020-11-30 DIAGNOSIS — M6281 Muscle weakness (generalized): Secondary | ICD-10-CM | POA: Diagnosis not present

## 2020-11-30 DIAGNOSIS — R29898 Other symptoms and signs involving the musculoskeletal system: Secondary | ICD-10-CM | POA: Diagnosis not present

## 2020-11-30 DIAGNOSIS — R2681 Unsteadiness on feet: Secondary | ICD-10-CM | POA: Diagnosis not present

## 2020-11-30 DIAGNOSIS — R296 Repeated falls: Secondary | ICD-10-CM | POA: Diagnosis not present

## 2020-12-01 DIAGNOSIS — R29898 Other symptoms and signs involving the musculoskeletal system: Secondary | ICD-10-CM | POA: Diagnosis not present

## 2020-12-01 DIAGNOSIS — R2681 Unsteadiness on feet: Secondary | ICD-10-CM | POA: Diagnosis not present

## 2020-12-01 DIAGNOSIS — M6281 Muscle weakness (generalized): Secondary | ICD-10-CM | POA: Diagnosis not present

## 2020-12-01 DIAGNOSIS — R296 Repeated falls: Secondary | ICD-10-CM | POA: Diagnosis not present

## 2020-12-03 DIAGNOSIS — R296 Repeated falls: Secondary | ICD-10-CM | POA: Diagnosis not present

## 2020-12-03 DIAGNOSIS — R2681 Unsteadiness on feet: Secondary | ICD-10-CM | POA: Diagnosis not present

## 2020-12-03 DIAGNOSIS — M6281 Muscle weakness (generalized): Secondary | ICD-10-CM | POA: Diagnosis not present

## 2020-12-03 DIAGNOSIS — R29898 Other symptoms and signs involving the musculoskeletal system: Secondary | ICD-10-CM | POA: Diagnosis not present

## 2020-12-04 DIAGNOSIS — M6281 Muscle weakness (generalized): Secondary | ICD-10-CM | POA: Diagnosis not present

## 2020-12-04 DIAGNOSIS — R2681 Unsteadiness on feet: Secondary | ICD-10-CM | POA: Diagnosis not present

## 2020-12-04 DIAGNOSIS — R296 Repeated falls: Secondary | ICD-10-CM | POA: Diagnosis not present

## 2020-12-04 DIAGNOSIS — R29898 Other symptoms and signs involving the musculoskeletal system: Secondary | ICD-10-CM | POA: Diagnosis not present

## 2020-12-07 DIAGNOSIS — R296 Repeated falls: Secondary | ICD-10-CM | POA: Diagnosis not present

## 2020-12-07 DIAGNOSIS — R29898 Other symptoms and signs involving the musculoskeletal system: Secondary | ICD-10-CM | POA: Diagnosis not present

## 2020-12-07 DIAGNOSIS — M6281 Muscle weakness (generalized): Secondary | ICD-10-CM | POA: Diagnosis not present

## 2020-12-07 DIAGNOSIS — R2681 Unsteadiness on feet: Secondary | ICD-10-CM | POA: Diagnosis not present

## 2020-12-08 DIAGNOSIS — M6281 Muscle weakness (generalized): Secondary | ICD-10-CM | POA: Diagnosis not present

## 2020-12-08 DIAGNOSIS — R29898 Other symptoms and signs involving the musculoskeletal system: Secondary | ICD-10-CM | POA: Diagnosis not present

## 2020-12-08 DIAGNOSIS — R296 Repeated falls: Secondary | ICD-10-CM | POA: Diagnosis not present

## 2020-12-08 DIAGNOSIS — R2681 Unsteadiness on feet: Secondary | ICD-10-CM | POA: Diagnosis not present

## 2020-12-10 DIAGNOSIS — R296 Repeated falls: Secondary | ICD-10-CM | POA: Diagnosis not present

## 2020-12-10 DIAGNOSIS — R2681 Unsteadiness on feet: Secondary | ICD-10-CM | POA: Diagnosis not present

## 2020-12-10 DIAGNOSIS — R29898 Other symptoms and signs involving the musculoskeletal system: Secondary | ICD-10-CM | POA: Diagnosis not present

## 2020-12-10 DIAGNOSIS — M6281 Muscle weakness (generalized): Secondary | ICD-10-CM | POA: Diagnosis not present

## 2020-12-11 ENCOUNTER — Encounter: Payer: Self-pay | Admitting: Nurse Practitioner

## 2020-12-11 ENCOUNTER — Non-Acute Institutional Stay: Payer: Medicare Other | Admitting: Nurse Practitioner

## 2020-12-11 DIAGNOSIS — F339 Major depressive disorder, recurrent, unspecified: Secondary | ICD-10-CM | POA: Diagnosis not present

## 2020-12-11 DIAGNOSIS — G609 Hereditary and idiopathic neuropathy, unspecified: Secondary | ICD-10-CM | POA: Diagnosis not present

## 2020-12-11 DIAGNOSIS — R269 Unspecified abnormalities of gait and mobility: Secondary | ICD-10-CM

## 2020-12-11 DIAGNOSIS — N1832 Chronic kidney disease, stage 3b: Secondary | ICD-10-CM | POA: Diagnosis not present

## 2020-12-11 DIAGNOSIS — D500 Iron deficiency anemia secondary to blood loss (chronic): Secondary | ICD-10-CM | POA: Diagnosis not present

## 2020-12-11 DIAGNOSIS — M6281 Muscle weakness (generalized): Secondary | ICD-10-CM | POA: Diagnosis not present

## 2020-12-11 DIAGNOSIS — F0280 Dementia in other diseases classified elsewhere without behavioral disturbance: Secondary | ICD-10-CM | POA: Diagnosis not present

## 2020-12-11 DIAGNOSIS — R296 Repeated falls: Secondary | ICD-10-CM | POA: Diagnosis not present

## 2020-12-11 DIAGNOSIS — K219 Gastro-esophageal reflux disease without esophagitis: Secondary | ICD-10-CM | POA: Diagnosis not present

## 2020-12-11 DIAGNOSIS — G309 Alzheimer's disease, unspecified: Secondary | ICD-10-CM

## 2020-12-11 DIAGNOSIS — I2510 Atherosclerotic heart disease of native coronary artery without angina pectoris: Secondary | ICD-10-CM | POA: Diagnosis not present

## 2020-12-11 DIAGNOSIS — R2681 Unsteadiness on feet: Secondary | ICD-10-CM | POA: Diagnosis not present

## 2020-12-11 DIAGNOSIS — R29898 Other symptoms and signs involving the musculoskeletal system: Secondary | ICD-10-CM | POA: Diagnosis not present

## 2020-12-11 NOTE — Progress Notes (Signed)
Location:   [facility] Room Number: [phone] Place of Service:  ALF [id number]) Provider:  Curren Mohrmann Otho Darner, NP    Patient Care Team: Virgie Dad, MD as PCP - General (Internal Medicine) Elouise Munroe, MD as PCP - Cardiology (Cardiology)  Extended Emergency Contact Information Primary Emergency Contact: Weeks,[name] Address: [address]          [place], [place] [address] [name] of [place] Phone: [phone] Mobile Phone: [phone] Relation: Daughter Secondary Emergency Contact: [name], [place] of [place] Mobile Phone: [phone] Relation: Daughter  Code Status:  FULL CODE Goals of care: Advanced Directive information Advanced Directives 12/11/2020  Does Patient Have a Medical Advance Directive? Yes  Type of Paramedic of [place];Living will  Does patient want to make changes to medical advance directive? No - Patient declined  Copy of [place] in Chart? Yes - validated most recent copy scanned in chart (See row information)  Would patient like information on creating a medical advance directive? -     Chief Complaint  Patient presents with   Medical Management of Chronic Issues    Routine follow up   Health Maintenance    Discuss need td/tdap vaccine and shingles vaccine.     HPI:  Pt is a 87 y.o. male seen today for medical management of chronic diseases.     Blood loss anemia, Hgb dropped from 10.1>>8.4>>8.0 11/17/20 due to massive ecchymoses arms, legs, shoulder blades, chest, sides, buttocks, but mostly in the left thigh-healed, Hgb 9.3 11/26/20, on Iron.                           CAD, NSTEMI stent f/u cardiology. Hospital stay 03/11/20-03/16/20 for NSTEMI, troponin 300s at ED, Cath showed LAD stenosis, underwent drug eluting stent, EF 55-60%, takes Atorvastatin, ASA, Plavix.  Added Isosorbide 15mg  since last chest pain 05/01/20             HTN,  controlled, off Metoprolol.             GERD, takes Pantoprazole             AI, EF 55-60%             CKD stage 3, Bun/creat 30/1.6 11/18/20             Alzheimer's dementia, didn't tolerate memory meds, underwent neurology eval in the past.             Peripheral neuropathy, reduced Gabapentin 200mg  tid since 11/12/20 after fall.              Depression takes Sertraline  Gait abnormality, multiple factorials, working with therapy, risk of falling is high.       Past Medical History:  Diagnosis Date   Anemia    Arthritis    arthritis,osteopenia,"spinal stenosis"   Bladder neck obstruction 03/28/2011   CAD (coronary artery disease)    Cancer ([facility]) 12-06-12   Prostate cancer'98   CKD (chronic kidney disease) stage 3, GFR 30-59 ml/min ([facility]) 08/17/2017   Depression    Essential hypertension, benign 04/10/2009   GERD (gastroesophageal reflux disease)    GERD without esophagitis 11/05/2016   H/O hiatal hernia    H/O vertigo 12-06-12   none recent   Hyperlipidemia    IBS (irritable bowel syndrome)    Mixed hyperlipidemia 04/10/2009  Neuropathy    Osteopenia    Peripheral neuropathy 12-06-12   peripheral neuropathy   Rhinitis    RLS (restless legs syndrome)    Vascular dementia ([facility]) 09/30/2019   Vitamin B12 deficiency    Past Surgical History:  Procedure Laterality Date   APPENDECTOMY     CARPAL TUNNEL RELEASE     both hands   cataract surgery Left 12-06-12   recent surgery   CHOLECYSTECTOMY     COLONOSCOPY WITH PROPOFOL N/A 12/25/2012   Procedure: COLONOSCOPY WITH PROPOFOL;  Surgeon: Garlan Fair, MD;  Location: [facility];  Service: Endoscopy;  Laterality: N/A;   CORONARY STENT INTERVENTION N/A 03/13/2020   Procedure: CORONARY STENT INTERVENTION;  Surgeon: Nelva Bush, MD;  Location: [facility];  Service: Cardiovascular;  Laterality: N/A;   ESOPHAGOGASTRODUODENOSCOPY (EGD) WITH PROPOFOL N/A 12/25/2012   Procedure: ESOPHAGOGASTRODUODENOSCOPY (EGD) WITH PROPOFOL;   Surgeon: Garlan Fair, MD;  Location: [facility];  Service: Endoscopy;  Laterality: N/A;   INTRAVASCULAR ULTRASOUND/IVUS N/A 03/13/2020   Procedure: Intravascular Ultrasound/IVUS;  Surgeon: Nelva Bush, MD;  Location: [facility];  Service: Cardiovascular;  Laterality: N/A;   LEFT HEART CATH AND CORONARY ANGIOGRAPHY N/A 03/13/2020   Procedure: LEFT HEART CATH AND CORONARY ANGIOGRAPHY;  Surgeon: Nelva Bush, MD;  Location: [facility];  Service: Cardiovascular;  Laterality: N/A;   PROSTATE SURGERY  12-06-12   TONSILLECTOMY     TOTAL KNEE ARTHROPLASTY Right 11/25/2014   Procedure: RIGHT TOTAL KNEE ARTHROPLASTY;  Surgeon: Melrose Nakayama, MD;  Location: [facility];  Service: Orthopedics;  Laterality: Right;    Allergies  Allergen Reactions   Niacin Itching   Penicillins Other (See Comments)    "rash"    Tramadol Itching    Allergies as of 12/11/2020       Reactions   Niacin Itching   Penicillins Other (See Comments)   "rash"   Tramadol Itching        Medication List        Accurate as of December 11, 2020  1:49 PM. If you have any questions, ask your nurse or doctor.          aspirin EC 81 MG tablet Take 81 mg by mouth daily. Swallow whole.   atorvastatin 80 MG tablet Commonly known as: LIPITOR Take 1 tablet (80 mg total) by mouth daily.   clopidogrel 75 MG tablet Commonly known as: PLAVIX Take 1 tablet (75 mg total) by mouth daily with breakfast.   gabapentin 100 MG capsule Commonly known as: NEURONTIN Take 200 mg by mouth 3 (three) times daily.   Iron (Ferrous Sulfate) 325 (65 Fe) MG Tabs Take 1 tablet by mouth. Mon and Fri   isosorbide mononitrate 30 MG 24 hr tablet Commonly known as: IMDUR Take 30 mg by mouth daily. Take 1/2 tab =15 mg, oral, Once A Day, **HOLD IF SBP IS LESS THAN 100**   lactose free nutrition Liqd Take 237 mLs by mouth daily.   nitroGLYCERIN 0.4 MG SL tablet Commonly known as: NITROSTAT Place 1 tablet (0.4 mg total)  under the tongue every 5 (five) minutes as needed for chest pain.   pantoprazole 40 MG tablet Commonly known as: PROTONIX Take 40 mg by mouth 2 (two) times daily.   sertraline 25 MG tablet Commonly known as: ZOLOFT Take 25 mg by mouth daily. Take with 100 mg tab to total 125 mg daily.   sertraline 100 MG tablet Commonly known as: ZOLOFT 1 tablet Daily for  Mood   Vitamin D 50 MCG (2000 UT) tablet Take 1 pill daily.        Review of Systems  Constitutional:  Negative for fatigue, fever and unexpected weight change.  HENT:  Positive for hearing loss. Negative for congestion and voice change.   Eyes:  Negative for visual disturbance.  Respiratory:  Negative for shortness of breath.   Cardiovascular:  Negative for chest pain, palpitations and leg swelling.  Gastrointestinal:  Negative for abdominal pain and constipation.  Genitourinary:  Negative for dysuria, frequency and urgency.  Musculoskeletal:  Positive for gait problem.  Skin:  Negative for color change.  Neurological:  Negative for speech difficulty, weakness, light-headedness and headaches.       Memory lapses.   Psychiatric/Behavioral:  Negative for behavioral problems and sleep disturbance. The patient is not nervous/anxious.    Immunization History  Administered Date(s) Administered   Influenza, High Dose Seasonal PF 03/05/2015, 03/05/2018   Influenza-Unspecified 04/22/2016, 03/13/2017, 06/13/2018, 03/28/2019   Moderna Sars-Covid-2 Vaccination 06/17/2019, 07/15/2019, 04/21/2020   Pneumococcal Conjugate-13 05/23/2017   Pneumococcal Polysaccharide-23 01/20/2016   Pertinent  Health Maintenance Due  Topic Date Due   INFLUENZA VACCINE  01/11/2021   PNA vac Low Risk Adult  Completed   Fall Risk  02/26/2020 09/09/2019 02/14/2019 07/26/2018 12/03/2017  Falls in the past year? 0 0 0 0 No  Number falls in past yr: 0 - 0 - -  Injury with Fall? 0 - 0 - -  Risk Factor Category  - - - - -  Risk for fall due to : No Fall Risks  No Fall Risks - - -  Follow up Falls evaluation completed;Falls prevention discussed Falls prevention discussed;Education provided;Falls evaluation completed - - -   Functional Status Survey:    Vitals:   12/11/20 0949  BP: 110/68  Pulse: 70  Resp: 20  Temp: (!) 97.2 F (36.2 C)  SpO2: 95%  Weight: 160 lb (72.6 kg)  Height: 6' (1.829 m)   Body mass index is 21.7 kg/m. Physical Exam Vitals and nursing note reviewed.  Constitutional:      Appearance: Normal appearance.  HENT:     Head: Normocephalic and atraumatic.     Mouth/Throat:     Mouth: Mucous membranes are moist.  Eyes:     Extraocular Movements: Extraocular movements intact.     Conjunctiva/sclera: Conjunctivae normal.     Pupils: Pupils are equal, round, and reactive to light.  Cardiovascular:     Rate and Rhythm: Normal rate and regular rhythm.     Heart sounds: No murmur heard. Pulmonary:     Effort: Pulmonary effort is normal.     Breath sounds: No wheezing.  Abdominal:     General: Bowel sounds are normal.     Palpations: Abdomen is soft.     Tenderness: There is no abdominal tenderness.  Musculoskeletal:     Cervical back: Normal range of motion and neck supple.     Right lower leg: Edema present.     Left lower leg: Edema present.     Comments: Minimal edema BLE  Skin:    General: Skin is warm and dry.     Findings: Bruising present.     Comments: A residual bruise about a quarter sided left hip from previous bruising. A new bruise about 3x3cm lateral lower left thigh, no pain, the patient has no recollection of how he obtained it.   Neurological:     General: No focal deficit present.  Mental Status: He is alert. Mental status is at baseline.     Motor: No weakness.     Gait: Gait abnormal.     Comments: Oriented to person, place  Psychiatric:        Mood and Affect: Mood normal.        Behavior: Behavior normal.    Labs reviewed: Recent Labs    02/26/20 1620 03/11/20 2307  03/15/20 0102 03/16/20 0120 03/20/20 0000 05/01/20 2220 06/18/20 0000 09/01/20 0000 11/18/20 0000  NA 143   < > 138 139   < > 141 144 144 142  K 5.0   < > 4.0 4.5   < > 4.9 3.7 4.1 4.1  CL 106   < > 107 108   < > 111 105 108 106  CO2 30   < > 23 24   < > 21* 30* 27* 24*  GLUCOSE 113*   < > 113* 106*  --  114*  --   --   --   BUN 29*   < > 21 24*   < > 32* 27* 28* 30*  CREATININE 1.84*   < > 1.88* 2.08*   < > 1.82* 1.9* 1.8* 1.6*  CALCIUM 9.6   < > 8.4* 8.6*   < > 8.5* 9.4 8.8 8.9  MG 2.3  --   --   --   --   --   --   --   --    < > = values in this interval not displayed.   Recent Labs    02/26/20 1620 03/20/20 0000 06/18/20 0000 09/01/20 0000  AST 13 13* 12* 12*  ALT 8* 11 9* 8*  ALKPHOS  --  53 82 71  BILITOT 0.6  --   --   --   PROT 6.6  --   --   --   ALBUMIN  --  3.4* 4.6 4.2   Recent Labs    03/14/20 0059 03/15/20 0102 03/20/20 0000 05/01/20 2220 06/18/20 0000 09/01/20 0000 11/04/20 0000 11/18/20 0000 11/26/20 0000  WBC 8.3 7.5   < > 8.3 9.5 7.4 6.7 6.1 7.4  NEUTROABS  --   --    < >  --  7,325.00 5,809.00  --  4,801.00  --   HGB 11.0* 10.5*   < > 8.8* 11.6* 10.4* 10.1* 8.0* 9.3*  HCT 32.6* 30.5*   < > 27.8* 35* 32* 32* 25* 29*  MCV 96.7 97.1  --  101.8*  --   --   --   --   --   PLT 116* 103*   < > 159 154 111* 102* 138* 141*   < > = values in this interval not displayed.   Lab Results  Component Value Date   TSH 3.715 03/12/2020   Lab Results  Component Value Date   HGBA1C 5.1 09/10/2019   Lab Results  Component Value Date   CHOL 171 02/26/2020   HDL 41 02/26/2020   LDLCALC 104 (H) 02/26/2020   TRIG 151 (H) 02/26/2020   CHOLHDL 4.2 02/26/2020    Significant Diagnostic Results in last 30 days:  No results found.  Assessment/Plan Hereditary and idiopathic peripheral neuropathy Peripheral neuropathy, reduced Gabapentin 200mg  tid since 11/12/20 after fall. wobbly gait with walker, working with therapy.   Depression, recurrent ([facility]) His  mood is stable, continue Sertraline.   Alzheimer's dementia without behavioral disturbance ([facility]) Alzheimer's dementia, didn't tolerate memory meds, underwent neurology eval in  the past.  CKD (chronic kidney disease) stage 3, GFR 30-59 ml/min ([facility]) CKD stage 3, Bun/creat 30/1.6 11/18/20  CORONARY ATHEROSCLEROSIS NATIVE CORONARY ARTERY EF 55-60%, CAD, NSTEMI stent f/u cardiology. Hospital stay 03/11/20-03/16/20 for NSTEMI, troponin 300s at ED, Cath showed LAD stenosis, underwent drug eluting stent, EF 55-60%, takes Atorvastatin, ASA, Plavix.  Added Isosorbide 15mg  since last chest pain 05/01/20  GERD without esophagitis Stable, continue Pantoprazole.   Iron deficiency anemia Blood loss anemia, Hgb dropped from 10.1>>8.4>>8.0 11/17/20 due to massive ecchymoses arms, legs, shoulder blades, chest, sides, buttocks, but mostly in the left thigh-healed, Hgb 9.3 11/26/20, on Iron.   Gait abnormality Gait abnormality, multiple factorials, working with therapy, risk of falling is high.     Family/ staff Communication: plan of care reviewed with the patient and charge nurse.   Labs/tests ordered:  none  Time spend 40 minutes.

## 2020-12-11 NOTE — Assessment & Plan Note (Signed)
CKD stage 3, Bun/creat 30/1.6 11/18/20

## 2020-12-11 NOTE — Assessment & Plan Note (Signed)
Gait abnormality, multiple factorials, working with therapy, risk of falling is high.

## 2020-12-11 NOTE — Assessment & Plan Note (Addendum)
EF 55-60%, CAD, NSTEMI stent f/u cardiology. Hospital stay 03/11/20-03/16/20 for NSTEMI, troponin 300s at ED, Cath showed LAD stenosis, underwent drug eluting stent, EF 55-60%, takes Atorvastatin, ASA, Plavix.  Added Isosorbide 15mg  since last chest pain 05/01/20

## 2020-12-11 NOTE — Assessment & Plan Note (Signed)
Blood loss anemia, Hgb dropped from 10.1>>8.4>>8.0 11/17/20 due to massive ecchymoses arms, legs, shoulder blades, chest, sides, buttocks, but mostly in the left thigh-healed, Hgb 9.3 11/26/20, on Iron.

## 2020-12-11 NOTE — Assessment & Plan Note (Signed)
Stable, continue Pantoprazole.  

## 2020-12-11 NOTE — Assessment & Plan Note (Signed)
Peripheral neuropathy, reduced Gabapentin 200mg  tid since 11/12/20 after fall. wobbly gait with walker, working with therapy.

## 2020-12-11 NOTE — Assessment & Plan Note (Signed)
His mood is stable, continue Sertraline.

## 2020-12-11 NOTE — Assessment & Plan Note (Signed)
Alzheimer's dementia, didn't tolerate memory meds, underwent neurology eval in the past.

## 2020-12-14 DIAGNOSIS — R29898 Other symptoms and signs involving the musculoskeletal system: Secondary | ICD-10-CM | POA: Diagnosis not present

## 2020-12-14 DIAGNOSIS — M6281 Muscle weakness (generalized): Secondary | ICD-10-CM | POA: Diagnosis not present

## 2020-12-14 DIAGNOSIS — R2681 Unsteadiness on feet: Secondary | ICD-10-CM | POA: Diagnosis not present

## 2020-12-14 DIAGNOSIS — R296 Repeated falls: Secondary | ICD-10-CM | POA: Diagnosis not present

## 2020-12-15 DIAGNOSIS — M6281 Muscle weakness (generalized): Secondary | ICD-10-CM | POA: Diagnosis not present

## 2020-12-15 DIAGNOSIS — R296 Repeated falls: Secondary | ICD-10-CM | POA: Diagnosis not present

## 2020-12-15 DIAGNOSIS — R29898 Other symptoms and signs involving the musculoskeletal system: Secondary | ICD-10-CM | POA: Diagnosis not present

## 2020-12-15 DIAGNOSIS — R2681 Unsteadiness on feet: Secondary | ICD-10-CM | POA: Diagnosis not present

## 2020-12-17 DIAGNOSIS — M6281 Muscle weakness (generalized): Secondary | ICD-10-CM | POA: Diagnosis not present

## 2020-12-17 DIAGNOSIS — R2681 Unsteadiness on feet: Secondary | ICD-10-CM | POA: Diagnosis not present

## 2020-12-17 DIAGNOSIS — R29898 Other symptoms and signs involving the musculoskeletal system: Secondary | ICD-10-CM | POA: Diagnosis not present

## 2020-12-17 DIAGNOSIS — R296 Repeated falls: Secondary | ICD-10-CM | POA: Diagnosis not present

## 2020-12-18 DIAGNOSIS — R2681 Unsteadiness on feet: Secondary | ICD-10-CM | POA: Diagnosis not present

## 2020-12-18 DIAGNOSIS — R29898 Other symptoms and signs involving the musculoskeletal system: Secondary | ICD-10-CM | POA: Diagnosis not present

## 2020-12-18 DIAGNOSIS — M6281 Muscle weakness (generalized): Secondary | ICD-10-CM | POA: Diagnosis not present

## 2020-12-18 DIAGNOSIS — R296 Repeated falls: Secondary | ICD-10-CM | POA: Diagnosis not present

## 2020-12-20 DIAGNOSIS — R2681 Unsteadiness on feet: Secondary | ICD-10-CM | POA: Diagnosis not present

## 2020-12-20 DIAGNOSIS — R29898 Other symptoms and signs involving the musculoskeletal system: Secondary | ICD-10-CM | POA: Diagnosis not present

## 2020-12-20 DIAGNOSIS — R296 Repeated falls: Secondary | ICD-10-CM | POA: Diagnosis not present

## 2020-12-20 DIAGNOSIS — M6281 Muscle weakness (generalized): Secondary | ICD-10-CM | POA: Diagnosis not present

## 2020-12-21 ENCOUNTER — Non-Acute Institutional Stay: Payer: Medicare Other | Admitting: Nurse Practitioner

## 2020-12-21 ENCOUNTER — Encounter: Payer: Self-pay | Admitting: Nurse Practitioner

## 2020-12-21 DIAGNOSIS — G309 Alzheimer's disease, unspecified: Secondary | ICD-10-CM | POA: Diagnosis not present

## 2020-12-21 DIAGNOSIS — I2510 Atherosclerotic heart disease of native coronary artery without angina pectoris: Secondary | ICD-10-CM | POA: Diagnosis not present

## 2020-12-21 DIAGNOSIS — S51019A Laceration without foreign body of unspecified elbow, initial encounter: Secondary | ICD-10-CM | POA: Diagnosis not present

## 2020-12-21 DIAGNOSIS — G609 Hereditary and idiopathic neuropathy, unspecified: Secondary | ICD-10-CM | POA: Diagnosis not present

## 2020-12-21 DIAGNOSIS — F339 Major depressive disorder, recurrent, unspecified: Secondary | ICD-10-CM | POA: Diagnosis not present

## 2020-12-21 DIAGNOSIS — R269 Unspecified abnormalities of gait and mobility: Secondary | ICD-10-CM

## 2020-12-21 DIAGNOSIS — K219 Gastro-esophageal reflux disease without esophagitis: Secondary | ICD-10-CM

## 2020-12-21 DIAGNOSIS — N1832 Chronic kidney disease, stage 3b: Secondary | ICD-10-CM | POA: Diagnosis not present

## 2020-12-21 DIAGNOSIS — D500 Iron deficiency anemia secondary to blood loss (chronic): Secondary | ICD-10-CM

## 2020-12-21 DIAGNOSIS — I10 Essential (primary) hypertension: Secondary | ICD-10-CM | POA: Diagnosis not present

## 2020-12-21 DIAGNOSIS — F0280 Dementia in other diseases classified elsewhere without behavioral disturbance: Secondary | ICD-10-CM

## 2020-12-21 NOTE — Progress Notes (Signed)
Location:   [facility] Room Number: [phone] Place of Service:  ALF [id number]) Provider:  Captola Teschner Otho Darner, NP    Patient Care Team: Virgie Dad, MD as PCP - General (Internal Medicine) Elouise Munroe, MD as PCP - Cardiology (Cardiology)  Extended Emergency Contact Information Primary Emergency Contact: Weeks,[name] Address: [address]          [place], [place] [address] [name] of [place] Phone: [phone] Mobile Phone: [phone] Relation: Daughter Secondary Emergency Contact: [name], [place] of [place] Mobile Phone: [phone] Relation: Daughter  Code Status:  DNR Goals of care: Advanced Directive information Advanced Directives 12/21/2020  Does Patient Have a Medical Advance Directive? Yes  Type of Paramedic of [place];Living will  Does patient want to make changes to medical advance directive? No - Patient declined  Copy of [place] in Chart? Yes - validated most recent copy scanned in chart (See row information)  Would patient like information on creating a medical advance directive? -     Chief Complaint  Patient presents with   Acute Visit    Patient presents for a bruise on mid back, left thigh, and left forearm     HPI:  Pt is a 87 y.o. male seen today for an acute visit for the left forearm skin tear, bruise right mid back, lateral left upper thigh. No recollection of the mechanism of the occurrence. No active bleeding or s/s of infection noted.   Blood loss anemia, Hgb dropped from 10.1>>8.4>>8.0 11/17/20 due to massive ecchymoses arms, legs, shoulder blades, chest, sides, buttocks, but mostly in the left thigh-healed, Hgb 9.3 11/26/20, on Iron.                           CAD, NSTEMI stent f/u cardiology. Hospital stay 03/11/20-03/16/20 for NSTEMI, troponin 300s at ED, Cath showed LAD stenosis, underwent drug eluting stent, EF 55-60%, takes  Atorvastatin, ASA, Plavix.  Added Isosorbide 23m since last chest pain 05/01/20             HTN, controlled, off Metoprolol.             GERD, takes Pantoprazole             AI, EF 55-60%             CKD stage 3, Bun/creat 30/1.6 11/18/20             Alzheimer's dementia, didn't tolerate memory meds, underwent neurology eval in the past.             Peripheral neuropathy, reduced Gabapentin 203mtid since 11/12/20 after fall.              Depression takes Sertraline             Gait abnormality, multiple factorials, working with therapy, risk of falling is high.      Past Medical History:  Diagnosis Date   Anemia    Arthritis    arthritis,osteopenia,"spinal stenosis"   Bladder neck obstruction 03/28/2011   CAD (coronary artery disease)    Cancer ([facility]   Prostate cancer'98   CKD (chronic kidney disease) stage 3, GFR 30-59 ml/min ([facility]/12/2017   Depression    Essential hypertension, benign 04/10/2009   GERD (gastroesophageal reflux disease)    GERD without esophagitis 11/05/2016   H/O hiatal  hernia    H/O vertigo 12-06-12   none recent   Hyperlipidemia    IBS (irritable bowel syndrome)    Mixed hyperlipidemia 04/10/2009   Neuropathy    Osteopenia    Peripheral neuropathy 12-06-12   peripheral neuropathy   Rhinitis    RLS (restless legs syndrome)    Vascular dementia ([place]) 09/30/2019   Vitamin B12 deficiency    Past Surgical History:  Procedure Laterality Date   APPENDECTOMY     CARPAL TUNNEL RELEASE     both hands   cataract surgery Left 12-06-12   recent surgery   CHOLECYSTECTOMY     COLONOSCOPY WITH PROPOFOL N/A 12/25/2012   Procedure: COLONOSCOPY WITH PROPOFOL;  Surgeon: Garlan Fair, MD;  Location: [facility];  Service: Endoscopy;  Laterality: N/A;   CORONARY STENT INTERVENTION N/A 03/13/2020   Procedure: CORONARY STENT INTERVENTION;  Surgeon: Nelva Bush, MD;  Location: [facility];  Service: Cardiovascular;  Laterality: N/A;    ESOPHAGOGASTRODUODENOSCOPY (EGD) WITH PROPOFOL N/A 12/25/2012   Procedure: ESOPHAGOGASTRODUODENOSCOPY (EGD) WITH PROPOFOL;  Surgeon: Garlan Fair, MD;  Location: [facility];  Service: Endoscopy;  Laterality: N/A;   INTRAVASCULAR ULTRASOUND/IVUS N/A 03/13/2020   Procedure: Intravascular Ultrasound/IVUS;  Surgeon: Nelva Bush, MD;  Location: [facility];  Service: Cardiovascular;  Laterality: N/A;   LEFT HEART CATH AND CORONARY ANGIOGRAPHY N/A 03/13/2020   Procedure: LEFT HEART CATH AND CORONARY ANGIOGRAPHY;  Surgeon: Nelva Bush, MD;  Location: [facility];  Service: Cardiovascular;  Laterality: N/A;   PROSTATE SURGERY  12-06-12   TONSILLECTOMY     TOTAL KNEE ARTHROPLASTY Right 11/25/2014   Procedure: RIGHT TOTAL KNEE ARTHROPLASTY;  Surgeon: Melrose Nakayama, MD;  Location: [facility];  Service: Orthopedics;  Laterality: Right;    Allergies  Allergen Reactions   Niacin Itching   Penicillins Other (See Comments)    "rash"    Tramadol Itching    Allergies as of 12/21/2020       Reactions   Niacin Itching   Penicillins Other (See Comments)   "rash"   Tramadol Itching        Medication List        Accurate as of December 21, 2020 11:59 PM. If you have any questions, ask your nurse or doctor.          STOP taking these medications    nitroGLYCERIN 0.4 MG SL tablet Commonly known as: NITROSTAT Stopped by: Davison Ohms X Alroy Portela, NP       TAKE these medications    aspirin EC 81 MG tablet Take 81 mg by mouth daily. Swallow whole.   atorvastatin 80 MG tablet Commonly known as: LIPITOR Take 1 tablet (80 mg total) by mouth daily.   clopidogrel 75 MG tablet Commonly known as: PLAVIX Take 1 tablet (75 mg total) by mouth daily with breakfast.   gabapentin 100 MG capsule Commonly known as: NEURONTIN Take 200 mg by mouth 3 (three) times daily.   Iron (Ferrous Sulfate) 325 (65 Fe) MG Tabs Take 1 tablet by mouth. Mon and Fri   isosorbide mononitrate 30 MG 24 hr  tablet Commonly known as: IMDUR Take 30 mg by mouth daily. Take 1/2 tab =15 mg, oral, Once A Day, **HOLD IF SBP IS LESS THAN 100**   lactose free nutrition Liqd Take 237 mLs by mouth daily.   pantoprazole 40 MG tablet Commonly known as: PROTONIX Take 40 mg by mouth 2 (two) times daily.   sertraline 25 MG tablet Commonly  known as: ZOLOFT Take 25 mg by mouth daily. Take with 100 mg tab to total 125 mg daily.   sertraline 100 MG tablet Commonly known as: ZOLOFT 1 tablet Daily for Mood   Vitamin D 50 MCG (2000 UT) tablet Take 1 pill daily.        Review of Systems  Constitutional:  Negative for activity change, appetite change and fever.  HENT:  Positive for hearing loss. Negative for congestion and voice change.   Eyes:  Negative for visual disturbance.  Respiratory:  Negative for shortness of breath.   Cardiovascular:  Negative for chest pain, palpitations and leg swelling.  Gastrointestinal:  Negative for abdominal pain and constipation.  Genitourinary:  Negative for dysuria, frequency and urgency.  Musculoskeletal:  Positive for gait problem.  Skin:  Positive for wound.  Neurological:  Negative for speech difficulty, weakness and light-headedness.       Memory lapses.   Psychiatric/Behavioral:  Positive for confusion. Negative for sleep disturbance. The patient is not nervous/anxious.    Immunization History  Administered Date(s) Administered   Influenza, High Dose Seasonal PF 03/05/2015, 03/05/2018   Influenza-Unspecified 04/22/2016, 03/13/2017, 06/13/2018, 03/28/2019   Moderna Sars-Covid-2 Vaccination 06/17/2019, 07/15/2019, 04/21/2020   Pneumococcal Conjugate-13 05/23/2017   Pneumococcal Polysaccharide-23 01/20/2016   Pertinent  Health Maintenance Due  Topic Date Due   INFLUENZA VACCINE  01/11/2021   PNA vac Low Risk Adult  Completed   Fall Risk  02/26/2020 09/09/2019 02/14/2019 07/26/2018 12/03/2017  Falls in the past year? 0 0 0 0 No  Number falls in past yr: 0 -  0 - -  Injury with Fall? 0 - 0 - -  Risk Factor Category  - - - - -  Risk for fall due to : No Fall Risks No Fall Risks - - -  Follow up Falls evaluation completed;Falls prevention discussed Falls prevention discussed;Education provided;Falls evaluation completed - - -   Functional Status Survey:    Vitals:   12/21/20 1442  BP: 120/72  Pulse: 64  Resp: 18  Temp: (!) 97.1 F (36.2 C)  SpO2: 94%  Weight: 160 lb 1.6 oz (72.6 kg)  Height: 6' (1.829 m)   Body mass index is 21.71 kg/m. Physical Exam Vitals and nursing note reviewed.  Constitutional:      Appearance: Normal appearance.  HENT:     Head: Normocephalic and atraumatic.     Mouth/Throat:     Mouth: Mucous membranes are moist.  Eyes:     Extraocular Movements: Extraocular movements intact.     Conjunctiva/sclera: Conjunctivae normal.     Pupils: Pupils are equal, round, and reactive to light.  Cardiovascular:     Rate and Rhythm: Normal rate and regular rhythm.     Heart sounds: No murmur heard. Pulmonary:     Effort: Pulmonary effort is normal.     Breath sounds: No wheezing.  Abdominal:     General: Bowel sounds are normal.     Palpations: Abdomen is soft.     Tenderness: There is no abdominal tenderness.  Musculoskeletal:     Cervical back: Normal range of motion and neck supple.     Right lower leg: No edema.     Left lower leg: No edema.     Comments: Minimal edema BLE  Skin:    General: Skin is warm and dry.     Findings: Bruising present.     Comments: Skin tear left forearm is well approximated with steri strips, no active bleeding  or s/s infection. A palm sized bruise noted lateral left upper thigh, a hand sized bruise right mid back, no pain with deep breathing.   Neurological:     General: No focal deficit present.     Mental Status: He is alert. Mental status is at baseline.     Motor: No weakness.     Gait: Gait abnormal.     Comments: Oriented to person, place  Psychiatric:         Behavior: Behavior normal.     Comments: Appears anxious, stating his clothes are missing.     Labs reviewed: Recent Labs    02/26/20 1620 03/11/20 2307 03/15/20 0102 03/16/20 0120 03/20/20 0000 05/01/20 2220 06/18/20 0000 09/01/20 0000 11/18/20 0000  NA 143   < > 138 139   < > 141 144 144 142  K 5.0   < > 4.0 4.5   < > 4.9 3.7 4.1 4.1  CL 106   < > 107 108   < > 111 105 108 106  CO2 30   < > 23 24   < > 21* 30* 27* 24*  GLUCOSE 113*   < > 113* 106*  --  114*  --   --   --   BUN 29*   < > 21 24*   < > 32* 27* 28* 30*  CREATININE 1.84*   < > 1.88* 2.08*   < > 1.82* 1.9* 1.8* 1.6*  CALCIUM 9.6   < > 8.4* 8.6*   < > 8.5* 9.4 8.8 8.9  MG 2.3  --   --   --   --   --   --   --   --    < > = values in this interval not displayed.   Recent Labs    02/26/20 1620 03/20/20 0000 06/18/20 0000 09/01/20 0000  AST 13 13* 12* 12*  ALT 8* 11 9* 8*  ALKPHOS  --  53 82 71  BILITOT 0.6  --   --   --   PROT 6.6  --   --   --   ALBUMIN  --  3.4* 4.6 4.2   Recent Labs    03/14/20 0059 03/15/20 0102 03/20/20 0000 05/01/20 2220 06/18/20 0000 09/01/20 0000 11/04/20 0000 11/18/20 0000 11/26/20 0000  WBC 8.3 7.5   < > 8.3 9.5 7.4 6.7 6.1 7.4  NEUTROABS  --   --    < >  --  7,325.00 5,809.00  --  4,801.00  --   HGB 11.0* 10.5*   < > 8.8* 11.6* 10.4* 10.1* 8.0* 9.3*  HCT 32.6* 30.5*   < > 27.8* 35* 32* 32* 25* 29*  MCV 96.7 97.1  --  101.8*  --   --   --   --   --   PLT 116* 103*   < > 159 154 111* 102* 138* 141*   < > = values in this interval not displayed.   Lab Results  Component Value Date   TSH 3.715 03/12/2020   Lab Results  Component Value Date   HGBA1C 5.1 09/10/2019   Lab Results  Component Value Date   CHOL 171 02/26/2020   HDL 41 02/26/2020   LDLCALC 104 (H) 02/26/2020   TRIG 151 (H) 02/26/2020   CHOLHDL 4.2 02/26/2020    Significant Diagnostic Results in last 30 days:  No results found.  Assessment/Plan Skin tear of elbow without complication, initial  encounter the left  forearm skin tear, bruise right mid back, lateral left upper thigh. No recollection of the mechanism of the occurrence. No active bleeding or s/s of infection noted. Steri strips on the left forearm skin tear should be self exfoliated. Update CBC/diff, CMP/eGFR.   Iron deficiency anemia Hgb dropped from 10.1>>8.4>>8.0 11/17/20 due to massive ecchymoses arms, legs, shoulder blades, chest, sides, buttocks, but mostly in the left thigh-healed, Hgb 9.3 11/26/20, on Iron.   CORONARY ATHEROSCLEROSIS NATIVE CORONARY ARTERY CAD, NSTEMI stent f/u cardiology. Hospital stay 03/11/20-03/16/20 for NSTEMI, troponin 300s at ED, Cath showed LAD stenosis, underwent drug eluting stent, EF 55-60%, takes Atorvastatin, ASA, Plavix.  Added Isosorbide 9m since last chest pain 05/01/20 AL EF 55-60%  Essential hypertension, benign Blood pressure is controlled, taking Imdur for hx of chest pain.   GERD without esophagitis Stable, continue Pantoprazole.   CKD (chronic kidney disease) stage 3, GFR 30-59 ml/min (HCC) stage 3, Bun/creat 30/1.6 11/18/20  Alzheimer's dementia without behavioral disturbance (HCC) didn't tolerate memory meds, underwent neurology eval in the past.  Hereditary and idiopathic peripheral neuropathy Peripheral neuropathy, reduced Gabapentin 2055mtid since 11/12/20 after fall.   Depression, recurrent ([facility] emotional outburst related to his dementia, continue Sertraline.   Gait abnormality Gait abnormality, multiple factorials, working with therapy, risk of falling is high.     Family/ staff Communication: plan of care reviewed with the patient and charge nurse.   Labs/tests ordered:    Time spend 40 minutes.

## 2020-12-22 ENCOUNTER — Encounter: Payer: Self-pay | Admitting: Nurse Practitioner

## 2020-12-22 DIAGNOSIS — M6281 Muscle weakness (generalized): Secondary | ICD-10-CM | POA: Diagnosis not present

## 2020-12-22 DIAGNOSIS — R2681 Unsteadiness on feet: Secondary | ICD-10-CM | POA: Diagnosis not present

## 2020-12-22 DIAGNOSIS — S51019A Laceration without foreign body of unspecified elbow, initial encounter: Secondary | ICD-10-CM | POA: Insufficient documentation

## 2020-12-22 DIAGNOSIS — R29898 Other symptoms and signs involving the musculoskeletal system: Secondary | ICD-10-CM | POA: Diagnosis not present

## 2020-12-22 DIAGNOSIS — R296 Repeated falls: Secondary | ICD-10-CM | POA: Diagnosis not present

## 2020-12-22 NOTE — Assessment & Plan Note (Signed)
Gait abnormality, multiple factorials, working with therapy, risk of falling is high.

## 2020-12-22 NOTE — Assessment & Plan Note (Signed)
Occasional emotional outburst related to his dementia, continue Sertraline.

## 2020-12-22 NOTE — Assessment & Plan Note (Addendum)
CAD, NSTEMI stent f/u cardiology. Hospital stay 03/11/20-03/16/20 for NSTEMI, troponin 300s at ED, Cath showed LAD stenosis, underwent drug eluting stent, EF 55-60%, takes Atorvastatin, ASA, Plavix.  Added Isosorbide 15mg  since last chest pain 05/01/20 AL EF 55-60%

## 2020-12-22 NOTE — Assessment & Plan Note (Signed)
Hgb dropped from 10.1>>8.4>>8.0 11/17/20 due to massive ecchymoses arms, legs, shoulder blades, chest, sides, buttocks, but mostly in the left thigh-healed, Hgb 9.3 11/26/20, on Iron.

## 2020-12-22 NOTE — Assessment & Plan Note (Signed)
stage 3, Bun/creat 30/1.6 11/18/20

## 2020-12-22 NOTE — Assessment & Plan Note (Signed)
Peripheral neuropathy, reduced Gabapentin 200mg  tid since 11/12/20 after fall.

## 2020-12-22 NOTE — Assessment & Plan Note (Signed)
Blood pressure is controlled, taking Imdur for hx of chest pain.

## 2020-12-22 NOTE — Assessment & Plan Note (Signed)
Stable, continue Pantoprazole.  

## 2020-12-22 NOTE — Assessment & Plan Note (Addendum)
the left forearm skin tear, bruise right mid back, lateral left upper thigh. No recollection of the mechanism of the occurrence. No active bleeding or s/s of infection noted. Steri strips on the left forearm skin tear should be self exfoliated. Update CBC/diff, CMP/eGFR.

## 2020-12-22 NOTE — Assessment & Plan Note (Signed)
didn't tolerate memory meds, underwent neurology eval in the past.

## 2020-12-23 DIAGNOSIS — M6281 Muscle weakness (generalized): Secondary | ICD-10-CM | POA: Diagnosis not present

## 2020-12-23 DIAGNOSIS — D649 Anemia, unspecified: Secondary | ICD-10-CM | POA: Diagnosis not present

## 2020-12-23 DIAGNOSIS — R296 Repeated falls: Secondary | ICD-10-CM | POA: Diagnosis not present

## 2020-12-23 DIAGNOSIS — I10 Essential (primary) hypertension: Secondary | ICD-10-CM | POA: Diagnosis not present

## 2020-12-23 DIAGNOSIS — R2681 Unsteadiness on feet: Secondary | ICD-10-CM | POA: Diagnosis not present

## 2020-12-23 DIAGNOSIS — R29898 Other symptoms and signs involving the musculoskeletal system: Secondary | ICD-10-CM | POA: Diagnosis not present

## 2020-12-23 LAB — CBC AND DIFFERENTIAL
HCT: 28 — AB (ref 41–53)
Hemoglobin: 8.9 — AB (ref 13.5–17.5)
Neutrophils Absolute: 5396
Platelets: 100 — AB (ref 150–399)
WBC: 6.9

## 2020-12-23 LAB — BASIC METABOLIC PANEL
BUN: 31 — AB (ref 4–21)
CO2: 26 — AB (ref 13–22)
Chloride: 108 (ref 99–108)
Creatinine: 1.6 — AB (ref 0.6–1.3)
Glucose: 95
Potassium: 4.5 (ref 3.4–5.3)
Sodium: 142 (ref 137–147)

## 2020-12-23 LAB — CBC: RBC: 2.89 — AB (ref 3.87–5.11)

## 2020-12-23 LAB — HEPATIC FUNCTION PANEL
ALT: 11 (ref 10–40)
AST: 13 — AB (ref 14–40)
Alkaline Phosphatase: 69 (ref 25–125)
Bilirubin, Total: 0.4

## 2020-12-23 LAB — COMPREHENSIVE METABOLIC PANEL
Albumin: 3.8 (ref 3.5–5.0)
Calcium: 8.7 (ref 8.7–10.7)
Globulin: 1.8

## 2020-12-24 DIAGNOSIS — R29898 Other symptoms and signs involving the musculoskeletal system: Secondary | ICD-10-CM | POA: Diagnosis not present

## 2020-12-24 DIAGNOSIS — M6281 Muscle weakness (generalized): Secondary | ICD-10-CM | POA: Diagnosis not present

## 2020-12-24 DIAGNOSIS — R2681 Unsteadiness on feet: Secondary | ICD-10-CM | POA: Diagnosis not present

## 2020-12-24 DIAGNOSIS — R296 Repeated falls: Secondary | ICD-10-CM | POA: Diagnosis not present

## 2020-12-25 DIAGNOSIS — M6281 Muscle weakness (generalized): Secondary | ICD-10-CM | POA: Diagnosis not present

## 2020-12-25 DIAGNOSIS — R2681 Unsteadiness on feet: Secondary | ICD-10-CM | POA: Diagnosis not present

## 2020-12-25 DIAGNOSIS — R29898 Other symptoms and signs involving the musculoskeletal system: Secondary | ICD-10-CM | POA: Diagnosis not present

## 2020-12-25 DIAGNOSIS — R296 Repeated falls: Secondary | ICD-10-CM | POA: Diagnosis not present

## 2020-12-30 DIAGNOSIS — R2681 Unsteadiness on feet: Secondary | ICD-10-CM | POA: Diagnosis not present

## 2020-12-30 DIAGNOSIS — R296 Repeated falls: Secondary | ICD-10-CM | POA: Diagnosis not present

## 2020-12-30 DIAGNOSIS — R29898 Other symptoms and signs involving the musculoskeletal system: Secondary | ICD-10-CM | POA: Diagnosis not present

## 2020-12-30 DIAGNOSIS — M6281 Muscle weakness (generalized): Secondary | ICD-10-CM | POA: Diagnosis not present

## 2021-01-01 DIAGNOSIS — R29898 Other symptoms and signs involving the musculoskeletal system: Secondary | ICD-10-CM | POA: Diagnosis not present

## 2021-01-01 DIAGNOSIS — R296 Repeated falls: Secondary | ICD-10-CM | POA: Diagnosis not present

## 2021-01-01 DIAGNOSIS — R2681 Unsteadiness on feet: Secondary | ICD-10-CM | POA: Diagnosis not present

## 2021-01-01 DIAGNOSIS — M6281 Muscle weakness (generalized): Secondary | ICD-10-CM | POA: Diagnosis not present

## 2021-02-13 ENCOUNTER — Emergency Department (HOSPITAL_COMMUNITY): Payer: Medicare Other

## 2021-02-13 ENCOUNTER — Other Ambulatory Visit: Payer: Self-pay

## 2021-02-13 ENCOUNTER — Emergency Department (HOSPITAL_COMMUNITY)
Admission: EM | Admit: 2021-02-13 | Discharge: 2021-02-13 | Disposition: A | Discharge status: Skilled Nursing Facility | Payer: Medicare Other | Attending: Emergency Medicine | Admitting: Emergency Medicine

## 2021-02-13 ENCOUNTER — Encounter (HOSPITAL_COMMUNITY): Payer: Self-pay

## 2021-02-13 DIAGNOSIS — Z7982 Long term (current) use of aspirin: Secondary | ICD-10-CM | POA: Insufficient documentation

## 2021-02-13 DIAGNOSIS — Z96651 Presence of right artificial knee joint: Secondary | ICD-10-CM | POA: Insufficient documentation

## 2021-02-13 DIAGNOSIS — Z8546 Personal history of malignant neoplasm of prostate: Secondary | ICD-10-CM | POA: Insufficient documentation

## 2021-02-13 DIAGNOSIS — I129 Hypertensive chronic kidney disease with stage 1 through stage 4 chronic kidney disease, or unspecified chronic kidney disease: Secondary | ICD-10-CM | POA: Insufficient documentation

## 2021-02-13 DIAGNOSIS — R0789 Other chest pain: Secondary | ICD-10-CM | POA: Diagnosis not present

## 2021-02-13 DIAGNOSIS — F0280 Dementia in other diseases classified elsewhere without behavioral disturbance: Secondary | ICD-10-CM | POA: Insufficient documentation

## 2021-02-13 DIAGNOSIS — Z955 Presence of coronary angioplasty implant and graft: Secondary | ICD-10-CM | POA: Insufficient documentation

## 2021-02-13 DIAGNOSIS — N183 Chronic kidney disease, stage 3 unspecified: Secondary | ICD-10-CM | POA: Insufficient documentation

## 2021-02-13 DIAGNOSIS — Z7902 Long term (current) use of antithrombotics/antiplatelets: Secondary | ICD-10-CM | POA: Insufficient documentation

## 2021-02-13 DIAGNOSIS — I959 Hypotension, unspecified: Secondary | ICD-10-CM | POA: Diagnosis not present

## 2021-02-13 DIAGNOSIS — R0902 Hypoxemia: Secondary | ICD-10-CM | POA: Diagnosis not present

## 2021-02-13 DIAGNOSIS — I2510 Atherosclerotic heart disease of native coronary artery without angina pectoris: Secondary | ICD-10-CM | POA: Insufficient documentation

## 2021-02-13 DIAGNOSIS — Z87891 Personal history of nicotine dependence: Secondary | ICD-10-CM | POA: Insufficient documentation

## 2021-02-13 DIAGNOSIS — R079 Chest pain, unspecified: Secondary | ICD-10-CM | POA: Diagnosis not present

## 2021-02-13 DIAGNOSIS — G309 Alzheimer's disease, unspecified: Secondary | ICD-10-CM | POA: Insufficient documentation

## 2021-02-13 LAB — CBC WITH DIFFERENTIAL/PLATELET
Abs Immature Granulocytes: 0.06 10*3/uL (ref 0.00–0.07)
Basophils Absolute: 0 10*3/uL (ref 0.0–0.1)
Basophils Relative: 0 %
Eosinophils Absolute: 0.2 10*3/uL (ref 0.0–0.5)
Eosinophils Relative: 2 %
HCT: 28.6 % — ABNORMAL LOW (ref 39.0–52.0)
Hemoglobin: 9 g/dL — ABNORMAL LOW (ref 13.0–17.0)
Immature Granulocytes: 1 %
Lymphocytes Relative: 11 %
Lymphs Abs: 0.8 10*3/uL (ref 0.7–4.0)
MCH: 30.6 pg (ref 26.0–34.0)
MCHC: 31.5 g/dL (ref 30.0–36.0)
MCV: 97.3 fL (ref 80.0–100.0)
Monocytes Absolute: 0.5 10*3/uL (ref 0.1–1.0)
Monocytes Relative: 6 %
Neutro Abs: 6.2 10*3/uL (ref 1.7–7.7)
Neutrophils Relative %: 80 %
Platelets: 133 10*3/uL — ABNORMAL LOW (ref 150–400)
RBC: 2.94 MIL/uL — ABNORMAL LOW (ref 4.22–5.81)
RDW: 13 % (ref 11.5–15.5)
WBC: 7.8 10*3/uL (ref 4.0–10.5)
nRBC: 0 % (ref 0.0–0.2)

## 2021-02-13 LAB — BASIC METABOLIC PANEL
Anion gap: 5 (ref 5–15)
BUN: 30 mg/dL — ABNORMAL HIGH (ref 8–23)
CO2: 28 mmol/L (ref 22–32)
Calcium: 9 mg/dL (ref 8.9–10.3)
Chloride: 107 mmol/L (ref 98–111)
Creatinine, Ser: 1.59 mg/dL — ABNORMAL HIGH (ref 0.61–1.24)
GFR, Estimated: 41 mL/min — ABNORMAL LOW (ref >60)
Glucose, Bld: 100 mg/dL — ABNORMAL HIGH (ref 70–99)
Potassium: 4.6 mmol/L (ref 3.5–5.1)
Sodium: 140 mmol/L (ref 135–145)

## 2021-02-13 LAB — TROPONIN I (HIGH SENSITIVITY)
Troponin I (High Sensitivity): 5 ng/L (ref <18)
Troponin I (High Sensitivity): 5 ng/L (ref <18)

## 2021-02-13 NOTE — ED Provider Notes (Signed)
Emergency Medicine Provider Triage Evaluation Note  Ryan Humphrey , a 87 y.o. male  was evaluated in triage.  Pt has history of dementia.  He is a poor historian and cannot verbalize why he is here.   EMS reported initial call out was for chest pain, however upon their arrival he was complaining of foot pain.  He did receive 1 SL NTG from facility staff [facility]).  Currently, patient has no complaints other than his feet being cold.  He does not recall having chest pain.  Review of Systems  Positive: ?chest pain Negative: Fever, cough  Physical Exam  BP (!) 110/46 (BP Location: Left Arm)   Pulse 73   Temp 98.4 F (36.9 C) (Oral)   Resp 18   SpO2 100%   Gen:   Awake, no distress   Resp:  Normal effort  MSK:   Moves extremities without difficulty  Other:    Medical Decision Making  Medically screening exam initiated at 4:49 AM.  Appropriate orders placed.  Shequille Wasdin Pillard was informed that the remainder of the evaluation will be completed by another provider, this initial triage assessment does not replace that evaluation, and the importance of remaining in the ED until their evaluation is complete.  Hx of dementia, poor historian.  Unable to verbalize why he is here.  Initial call out was for chest pain but denies this at present.  He did receive NTG at facility.  Will proceed with CP work-up as he does have known cardiac history.   Larene Pickett, PA-C 02/13/21 Dennis Bast    Veryl Speak, MD 02/13/21 [phone]

## 2021-02-13 NOTE — ED Notes (Signed)
Daughter called. States she will be here to sit with him shortly.

## 2021-02-13 NOTE — ED Provider Notes (Signed)
[facility] EMERGENCY DEPARTMENT Provider Note   CSN: [id number] Arrival date & time: 02/13/21  0450     History Chief Complaint  Patient presents with   Chest Pain    Ryan Humphrey is a 87 y.o. male.  Patient presents with concern for chest pain.  He comes from a nursing home with history of dementia.  Reportedly cried out complaining of chest pain at that time he went to the ER he said that he no longer had any chest pain.  Additional history review of systems very difficult to obtain from this patient with dementia.  Family is at bedside and state that he is at his baseline and appears comfortable at this time.  No reports given no fevers or cough or vomiting or diarrhea.      Past Medical History:  Diagnosis Date   Anemia    Arthritis    arthritis,osteopenia,"spinal stenosis"   Bladder neck obstruction 03/28/2011   CAD (coronary artery disease)    Cancer ([place]) 12-06-12   Prostate cancer'98   CKD (chronic kidney disease) stage 3, GFR 30-59 ml/min ([place]) 08/17/2017   Depression    Essential hypertension, benign 04/10/2009   GERD (gastroesophageal reflux disease)    GERD without esophagitis 11/05/2016   H/O hiatal hernia    H/O vertigo 12-06-12   none recent   Hyperlipidemia    IBS (irritable bowel syndrome)    Mixed hyperlipidemia 04/10/2009   Neuropathy    Osteopenia    Peripheral neuropathy 12-06-12   peripheral neuropathy   Rhinitis    RLS (restless legs syndrome)    Vascular dementia ([place]) 09/30/2019   Vitamin B12 deficiency     Patient Active Problem List   Diagnosis Date Noted   Skin tear of elbow without complication, initial encounter 12/22/2020   Iron deficiency anemia 06/17/2020   Gait abnormality 04/22/2020   Fall 03/18/2020   Depression, recurrent ([place]) 03/18/2020   NSTEMI (non-ST elevated myocardial infarction) ([place]) 03/12/2020   Alzheimer's dementia without behavioral disturbance ([place]) 09/30/2019   CKD (chronic kidney disease)  stage 3, GFR 30-59 ml/min ([place]) 08/17/2017   History of TIA (transient ischemic attack) 11/05/2016   Vitamin B12 deficiency 11/05/2016   GERD without esophagitis 11/05/2016   Hereditary and idiopathic peripheral neuropathy 08/20/2015   Benign essential tremor 08/20/2015   Vitamin D deficiency 01/06/2015   Medication management 01/06/2015   Primary osteoarthritis of right knee 11/25/2014   Malignant neoplasm of prostate ([place]) 03/28/2011   INTERMITTENT VERTIGO 10/12/2009   Mixed hyperlipidemia 04/10/2009   Essential hypertension, benign 04/10/2009   CORONARY ATHEROSCLEROSIS NATIVE CORONARY ARTERY 04/10/2009    Past Surgical History:  Procedure Laterality Date   APPENDECTOMY     CARPAL TUNNEL RELEASE     both hands   cataract surgery Left 12-06-12   recent surgery   CHOLECYSTECTOMY     COLONOSCOPY WITH PROPOFOL N/A 12/25/2012   Procedure: COLONOSCOPY WITH PROPOFOL;  Surgeon: Garlan Fair, MD;  Location: [facility];  Service: Endoscopy;  Laterality: N/A;   CORONARY STENT INTERVENTION N/A 03/13/2020   Procedure: CORONARY STENT INTERVENTION;  Surgeon: Nelva Bush, MD;  Location: [place] CV LAB;  Service: Cardiovascular;  Laterality: N/A;   ESOPHAGOGASTRODUODENOSCOPY (EGD) WITH PROPOFOL N/A 12/25/2012   Procedure: ESOPHAGOGASTRODUODENOSCOPY (EGD) WITH PROPOFOL;  Surgeon: Garlan Fair, MD;  [facility];  Service: Endoscopy;  Laterality: N/A;   INTRAVASCULAR ULTRASOUND/IVUS N/A 03/13/2020   Procedure: Intravascular Ultrasound/IVUS;  Surgeon: Nelva Bush, MD;  Location: [facility]  INVASIVE CV LAB;  Service: Cardiovascular;  Laterality: N/A;   LEFT HEART CATH AND CORONARY ANGIOGRAPHY N/A 03/13/2020   Procedure: LEFT HEART CATH AND CORONARY ANGIOGRAPHY;  Surgeon: Nelva Bush, MD;  Location: [place] CV LAB;  Service: Cardiovascular;  Laterality: N/A;   PROSTATE SURGERY  12-06-12   TONSILLECTOMY     TOTAL KNEE ARTHROPLASTY Right 11/25/2014   Procedure: RIGHT TOTAL  KNEE ARTHROPLASTY;  Surgeon: Melrose Nakayama, MD;  Location: [place];  Service: Orthopedics;  Laterality: Right;       Family History  Problem Relation Age of Onset   CAD Father        Deceased, 55   Dementia Mother        Deceased, 49   Diabetes Brother    Hyperlipidemia Brother    Hypertension Brother    Dementia Brother    Colon cancer Neg Hx    Colon polyps Neg Hx    Kidney disease Neg Hx    Esophageal cancer Neg Hx    Gallbladder disease Neg Hx     Social History   Tobacco Use   Smoking status: Former    Packs/day: 0.50    Years: 30.00    Pack years: 15.00    Types: Cigarettes    Quit date: 06/14/1963    Years since quitting: 57.7   Smokeless tobacco: Never  Vaping Use   Vaping Use: Never used  Substance Use Topics   Alcohol use: Yes    Alcohol/week: 0.0 standard drinks    Comment: Socially   Drug use: No    Home Medications Prior to Admission medications   Medication Sig Start Date End Date Taking? Authorizing Provider  aspirin EC 81 MG tablet Take 81 mg by mouth daily. Swallow whole.    [provider]  atorvastatin (LIPITOR) 80 MG tablet Take 1 tablet (80 mg total) by mouth daily. 03/17/20   Leanor Kail, PA  Cholecalciferol (VITAMIN D) 50 MCG (2000 UT) tablet Take 1 pill daily. 01/10/19   Unk Pinto, MD  clopidogrel (PLAVIX) 75 MG tablet Take 1 tablet (75 mg total) by mouth daily with breakfast. 03/17/20   Bhagat, Bhavinkumar, PA  gabapentin (NEURONTIN) 100 MG capsule Take 200 mg by mouth 3 (three) times daily.    [provider]  Iron, Ferrous Sulfate, 325 (65 Fe) MG TABS Take 1 tablet by mouth. Mon and Fri    [provider]  isosorbide mononitrate (IMDUR) 30 MG 24 hr tablet Take 30 mg by mouth daily. Take 1/2 tab =15 mg, oral, Once A Day, **HOLD IF SBP IS LESS THAN 100**    [provider]  lactose free nutrition (BOOST) LIQD Take 237 mLs by mouth daily.    [provider]  pantoprazole (PROTONIX) 40 MG  tablet Take 40 mg by mouth 2 (two) times daily.    [provider]  sertraline (ZOLOFT) 100 MG tablet 1 tablet Daily for Mood 09/10/19 03/30/30  Unk Pinto, MD  sertraline (ZOLOFT) 25 MG tablet Take 25 mg by mouth daily. Take with 100 mg tab to total 125 mg daily.    [provider]    Allergies    Niacin, Penicillins, and Tramadol  Review of Systems   Review of Systems  Unable to perform ROS: Dementia   Physical Exam Updated Vital Signs BP 127/69   Pulse 75   Temp 97.9 F (36.6 C) (Oral)   Resp 17   SpO2 100%   Physical Exam Constitutional:  Appearance: He is well-developed.  HENT:     Head: Normocephalic.     Nose: Nose normal.  Eyes:     Extraocular Movements: Extraocular movements intact.  Cardiovascular:     Rate and Rhythm: Normal rate.  Pulmonary:     Effort: Pulmonary effort is normal.  Skin:    Coloration: Skin is not jaundiced.  Neurological:     Mental Status: He is alert. Mental status is at baseline.     Comments: Patient is smiling appears comfortable.  Is awake and alert answers my questions to the best of his ability and appears been moving all extremities.    ED Results / Procedures / Treatments   Labs (all labs ordered are listed, but only abnormal results are displayed) Labs Reviewed  CBC WITH DIFFERENTIAL/PLATELET - Abnormal; Notable for the following components:      Result Value   RBC 2.94 (*)    Hemoglobin 9.0 (*)    HCT 28.6 (*)    Platelets 133 (*)    All other components within normal limits  BASIC METABOLIC PANEL - Abnormal; Notable for the following components:   Glucose, Bld 100 (*)    BUN 30 (*)    Creatinine, Ser 1.59 (*)    GFR, Estimated 41 (*)    All other components within normal limits  TROPONIN I (HIGH SENSITIVITY)  TROPONIN I (HIGH SENSITIVITY)    EKG EKG Interpretation  Date/Time:  Saturday February 13 2021 04:48:39 EDT Ventricular Rate:  73 PR Interval:  178 QRS Duration: 136 QT  Interval:  426 QTC Calculation: 469 R Axis:   -23 Text Interpretation: Normal sinus rhythm Right bundle branch block Septal infarct , age undetermined Abnormal ECG Confirmed by Thamas Jaegers ([id number]) on 02/13/2021 7:37:20 AM  Radiology DG Chest 2 View  Result Date: 02/13/2021 CLINICAL DATA:  Chest pain EXAM: CHEST - 2 VIEW COMPARISON:  05/01/2020 chest radiograph. FINDINGS: Stable cardiomediastinal silhouette with normal heart size. No pneumothorax. No pleural effusion. Lungs appear clear, with no acute consolidative airspace disease and no pulmonary edema. IMPRESSION: No active cardiopulmonary disease. Electronically Signed   By: Ilona Sorrel M.D.   On: 02/13/2021 05:56    Procedures Procedures   Medications Ordered in ED Medications - No data to display  ED Course  I have reviewed the triage vital signs and the nursing notes.  Pertinent labs & imaging results that were available during my care of the patient were reviewed by me and considered in my medical decision making (see chart for details).    MDM Rules/Calculators/A&P                           EKG shows sinus rhythm, no ST elevations noted depressions normal rate.  Labs otherwise unremarkable troponin is negative x2 patient remains comfortable during his ER stay.  I doubt acute coronary syndrome.  Will recommend outpatient follow-up with his doctors within the week.  Recommending immediate return for worsening pain fevers cough or any additional concerns.  Final Clinical Impression(s) / ED Diagnoses Final diagnoses:  Nonspecific chest pain    Rx / DC Orders ED Discharge Orders     None        Luna Fuse, MD 02/13/21 [phone]

## 2021-02-13 NOTE — ED Triage Notes (Signed)
Pt comes via [facility] from [facility], called out for CP, given one nitro PTA, does not have any now or any  other complaints, hx of dementia

## 2021-02-13 NOTE — Discharge Instructions (Addendum)
Call your primary care doctor or specialist as discussed in the next 2-3 days.   Return immediately back to the ER if:  Your symptoms worsen within the next 12-24 hours. You develop new symptoms such as new fevers, persistent vomiting, new pain, shortness of breath, or new weakness or numbness, or if you have any other concerns.  

## 2021-02-15 DIAGNOSIS — Z20822 Contact with and (suspected) exposure to covid-19: Secondary | ICD-10-CM | POA: Diagnosis not present

## 2021-03-01 ENCOUNTER — Ambulatory Visit: Payer: Medicare Other | Admitting: Adult Health

## 2021-03-02 DIAGNOSIS — Z23 Encounter for immunization: Secondary | ICD-10-CM | POA: Diagnosis not present

## 2021-04-06 ENCOUNTER — Other Ambulatory Visit: Payer: Self-pay

## 2021-04-06 ENCOUNTER — Encounter: Payer: Self-pay | Admitting: Internal Medicine

## 2021-04-06 ENCOUNTER — Ambulatory Visit (INDEPENDENT_AMBULATORY_CARE_PROVIDER_SITE_OTHER): Payer: Medicare Other | Admitting: Internal Medicine

## 2021-04-06 VITALS — BP 120/64 | HR 73 | Ht 72.0 in | Wt 153.4 lb

## 2021-04-06 DIAGNOSIS — I25119 Atherosclerotic heart disease of native coronary artery with unspecified angina pectoris: Secondary | ICD-10-CM | POA: Diagnosis not present

## 2021-04-06 DIAGNOSIS — R2689 Other abnormalities of gait and mobility: Secondary | ICD-10-CM | POA: Diagnosis not present

## 2021-04-06 DIAGNOSIS — I214 Non-ST elevation (NSTEMI) myocardial infarction: Secondary | ICD-10-CM

## 2021-04-06 DIAGNOSIS — Z955 Presence of coronary angioplasty implant and graft: Secondary | ICD-10-CM | POA: Diagnosis not present

## 2021-04-06 DIAGNOSIS — E782 Mixed hyperlipidemia: Secondary | ICD-10-CM

## 2021-04-06 DIAGNOSIS — Z79899 Other long term (current) drug therapy: Secondary | ICD-10-CM

## 2021-04-06 DIAGNOSIS — I10 Essential (primary) hypertension: Secondary | ICD-10-CM

## 2021-04-06 DIAGNOSIS — Z9181 History of falling: Secondary | ICD-10-CM | POA: Diagnosis not present

## 2021-04-06 NOTE — Progress Notes (Signed)
Cardiology Office Note:    Date:  04/06/2021   ID:  Ryan Humphrey, DOB [birth year], MRN [id number]  PCP:  Virgie Dad, MD  Cardiologist:  Elouise Munroe, MD  Electrophysiologist:  None   Referring MD: Virgie Dad, MD   Chief Complaint/Reason for Referral: Follow NSTEMI  History of Present Illness:    Ryan Humphrey is a 87 y.o. male with a history of CAD by cath in 2010, HLD, hypertension, dementia, CKD III and prostate cancer who presented for chest pain evaluation on  03/12/20, found to have elevated troponin, found to have NSTEMI and received PCI to proximal and mid Lad with overlapping DES on 03/13/20. Mild residual disease in LCX and RCA. DAPT recommended for minimum of 12 months. He has completed DAPT now and we discussed stopping plavix.  He feel well today without complaint. No CP, no SOB, no palpitations. No syncope. He is very hard of hearing and is unclear where he is today. He is at the visit alone, and asks several times if he is in [place].  Past Medical History:  Diagnosis Date   Anemia    Arthritis    arthritis,osteopenia,"spinal stenosis"   Bladder neck obstruction 03/28/2011   CAD (coronary artery disease)    Cancer ([place]) 12-06-12   Prostate cancer'98   CKD (chronic kidney disease) stage 3, GFR 30-59 ml/min ([place]) 08/17/2017   Depression    Essential hypertension, benign 04/10/2009   GERD (gastroesophageal reflux disease)    GERD without esophagitis 11/05/2016   H/O hiatal hernia    H/O vertigo 12-06-12   none recent   Hyperlipidemia    IBS (irritable bowel syndrome)    Mixed hyperlipidemia 04/10/2009   Neuropathy    Osteopenia    Peripheral neuropathy 12-06-12   peripheral neuropathy   Rhinitis    RLS (restless legs syndrome)    Vascular dementia ([place]) 09/30/2019   Vitamin B12 deficiency     Past Surgical History:  Procedure Laterality Date   APPENDECTOMY     CARPAL TUNNEL RELEASE     both hands   cataract surgery Left 12-06-12   recent  surgery   CHOLECYSTECTOMY     COLONOSCOPY WITH PROPOFOL N/A 12/25/2012   Procedure: COLONOSCOPY WITH PROPOFOL;  Surgeon: Garlan Fair, MD;  [facility];  Service: Endoscopy;  Laterality: N/A;   CORONARY STENT INTERVENTION N/A 03/13/2020   Procedure: CORONARY STENT INTERVENTION;  Surgeon: Nelva Bush, MD;  Location: [place] CV LAB;  Service: Cardiovascular;  Laterality: N/A;   ESOPHAGOGASTRODUODENOSCOPY (EGD) WITH PROPOFOL N/A 12/25/2012   Procedure: ESOPHAGOGASTRODUODENOSCOPY (EGD) WITH PROPOFOL;  Surgeon: Garlan Fair, MD;  [facility];  Service: Endoscopy;  Laterality: N/A;   INTRAVASCULAR ULTRASOUND/IVUS N/A 03/13/2020   Procedure: Intravascular Ultrasound/IVUS;  Surgeon: Nelva Bush, MD;  Location: [place] CV LAB;  Service: Cardiovascular;  Laterality: N/A;   LEFT HEART CATH AND CORONARY ANGIOGRAPHY N/A 03/13/2020   Procedure: LEFT HEART CATH AND CORONARY ANGIOGRAPHY;  Surgeon: Nelva Bush, MD;  Location: [place] CV LAB;  Service: Cardiovascular;  Laterality: N/A;   PROSTATE SURGERY  12-06-12   TONSILLECTOMY     TOTAL KNEE ARTHROPLASTY Right 11/25/2014   Procedure: RIGHT TOTAL KNEE ARTHROPLASTY;  Surgeon: Melrose Nakayama, MD;  Location: [place];  Service: Orthopedics;  Laterality: Right;    Current Medications: Current Meds  Medication Sig   aspirin EC 81 MG tablet Take 81 mg by mouth daily. Swallow whole.   atorvastatin (LIPITOR) 80  MG tablet Take 1 tablet (80 mg total) by mouth daily.   Cholecalciferol (VITAMIN D) 50 MCG (2000 UT) tablet Take 1 pill daily.   clopidogrel (PLAVIX) 75 MG tablet Take 1 tablet (75 mg total) by mouth daily with breakfast.   gabapentin (NEURONTIN) 100 MG capsule Take 200 mg by mouth 3 (three) times daily.   Iron, Ferrous Sulfate, 325 (65 Fe) MG TABS Take 1 tablet by mouth. Mon and Fri   isosorbide mononitrate (IMDUR) 30 MG 24 hr tablet Take 30 mg by mouth daily. Take 1/2 tab =15 mg, oral, Once A Day, **HOLD IF  SBP IS LESS THAN 100**   lactose free nutrition (BOOST) LIQD Take 237 mLs by mouth daily.   pantoprazole (PROTONIX) 40 MG tablet Take 40 mg by mouth 2 (two) times daily.   sertraline (ZOLOFT) 100 MG tablet 1 tablet Daily for Mood   sertraline (ZOLOFT) 25 MG tablet Take 25 mg by mouth daily. Take with 100 mg tab to total 125 mg daily.     Allergies:   Niacin, Penicillins, and Tramadol   Social History   Tobacco Use   Smoking status: Former    Packs/day: 0.50    Years: 30.00    Pack years: 15.00    Types: Cigarettes    Quit date: 06/14/1963    Years since quitting: 57.8   Smokeless tobacco: Never  Vaping Use   Vaping Use: Never used  Substance Use Topics   Alcohol use: Yes    Alcohol/week: 0.0 standard drinks    Comment: Socially   Drug use: No     Family History: The patient's family history includes CAD in his father; Dementia in his brother and mother; Diabetes in his brother; Hyperlipidemia in his brother; Hypertension in his brother. There is no history of Colon cancer, Colon polyps, Kidney disease, Esophageal cancer, or Gallbladder disease.  ROS:   Please see the history of present illness.    All other systems reviewed and are negative.  EKGs/Labs/Other Studies Reviewed:    The following studies were reviewed today:  EKG:  NSR,RBBB, QRS 176ms Last visit: NSR, RBBB  I have independently reviewed the images from coronary angiogram 03/13/20.  Recent Labs: 09/01/2020: ALT 8 02/13/2021: BUN 30; Creatinine, Ser 1.59; Hemoglobin 9.0; Platelets 133; Potassium 4.6; Sodium 140  Recent Lipid Panel    Component Value Date/Time   CHOL 171 02/26/2020 1620   TRIG 151 (H) 02/26/2020 1620   HDL 41 02/26/2020 1620   CHOLHDL 4.2 02/26/2020 1620   VLDL 10 09/30/2019 0755   LDLCALC 104 (H) 02/26/2020 1620    Physical Exam:    VS:  BP 120/64   Pulse 73   Ht 6' (1.829 m)   Wt 153 lb 6.4 oz (69.6 kg)   SpO2 99%   BMI 20.80 kg/m     Wt Readings from Last 5 Encounters:   04/06/21 153 lb 6.4 oz (69.6 kg)  12/21/20 160 lb 1.6 oz (72.6 kg)  12/11/20 160 lb (72.6 kg)  11/18/20 160 lb (72.6 kg)  11/12/20 160 lb (72.6 kg)    Constitutional: No acute distress Cardiovascular: regular rhythm, normal rate, no murmurs. No jugular venous distention.  Respiratory: clear to auscultation bilaterally GI : normal bowel sounds, soft and nontender. No distention.   MSK: extremities warm, well perfused. No edema.  NEURO: grossly nonfocal exam, moves all extremities. PSYCH: alert but not oriented to place or time.   ASSESSMENT:    1. Coronary artery disease involving  native heart with angina pectoris, unspecified vessel or lesion type ([place])   2. NSTEMI (non-ST elevated myocardial infarction) ([place])   3. S/P drug eluting coronary stent placement   4. Mixed hyperlipidemia   5. Essential hypertension, benign   6. Medication management     PLAN:    Doing well overall. Completed DAPT, discontinue plavix today, continue ASA indefinitely.   He is confused on his medications today and is not oriented to place or why he is at the office. Would recommend he come to any future appointments with a care aid or family. He was sent to his visit alone today.   Continue statin, prn nitro.    Total time of encounter: 20 minutes total time of encounter, including 15 minutes spent in face-to-face patient care on the date of this encounter. This time includes coordination of care and counseling regarding above mentioned problem list. Remainder of non-face-to-face time involved reviewing chart documents/testing relevant to the patient encounter and documentation in the medical record. I have independently reviewed documentation from referring provider.   Cherlynn Kaiser, MD, [facility]     Medication Adjustments/Labs and Tests Ordered: Current medicines are reviewed at length with the patient today.  Concerns regarding medicines are outlined above.   Orders  Placed This Encounter  Procedures   EKG 12-Lead     No orders of the defined types were placed in this encounter.    Patient Instructions  Medication Instructions:  STOP TAKING PLAVIX (CLOPIDOGREL) CONTINUE ASPIRIN 81 mg DAILY PLEASE CALL WITH ANY ISSUES ([phone]) *If you need a refill on your cardiac medications before your next appointment, please call your pharmacy*  Follow-Up: At [facility], you and your health needs are our priority.  As part of our continuing mission to provide you with exceptional heart care, we have created designated Provider Care Teams.  These Care Teams include your primary Cardiologist (physician) and Advanced Practice Providers (APPs -  Physician Assistants and Nurse Practitioners) who all work together to provide you with the care you need, when you need it.  Your next appointment:    AS NEEDED - TO BE DETERMINED BY [place] [phone])   The format for your next appointment:   In Person  Provider:   Cherlynn Kaiser, MD

## 2021-04-06 NOTE — Patient Instructions (Signed)
Medication Instructions:  STOP TAKING PLAVIX (CLOPIDOGREL) CONTINUE ASPIRIN 81 mg DAILY PLEASE CALL WITH ANY ISSUES ([phone]) *If you need a refill on your cardiac medications before your next appointment, please call your pharmacy*  Follow-Up: At [facility], you and your health needs are our priority.  As part of our continuing mission to provide you with exceptional heart care, we have created designated Provider Care Teams.  These Care Teams include your primary Cardiologist (physician) and Advanced Practice Providers (APPs -  Physician Assistants and Nurse Practitioners) who all work together to provide you with the care you need, when you need it.  Your next appointment:    AS NEEDED - TO BE DETERMINED BY [place] [phone])   The format for your next appointment:   In Person  Provider:   Cherlynn Kaiser, MD

## 2021-04-08 DIAGNOSIS — Z9181 History of falling: Secondary | ICD-10-CM | POA: Diagnosis not present

## 2021-04-08 DIAGNOSIS — R2689 Other abnormalities of gait and mobility: Secondary | ICD-10-CM | POA: Diagnosis not present

## 2021-04-09 ENCOUNTER — Encounter: Payer: Self-pay | Admitting: Nurse Practitioner

## 2021-04-09 ENCOUNTER — Non-Acute Institutional Stay: Payer: Medicare Other | Admitting: Nurse Practitioner

## 2021-04-09 DIAGNOSIS — F0280 Dementia in other diseases classified elsewhere without behavioral disturbance: Secondary | ICD-10-CM

## 2021-04-09 DIAGNOSIS — I214 Non-ST elevation (NSTEMI) myocardial infarction: Secondary | ICD-10-CM

## 2021-04-09 DIAGNOSIS — K219 Gastro-esophageal reflux disease without esophagitis: Secondary | ICD-10-CM | POA: Diagnosis not present

## 2021-04-09 DIAGNOSIS — G609 Hereditary and idiopathic neuropathy, unspecified: Secondary | ICD-10-CM

## 2021-04-09 DIAGNOSIS — R269 Unspecified abnormalities of gait and mobility: Secondary | ICD-10-CM

## 2021-04-09 DIAGNOSIS — I10 Essential (primary) hypertension: Secondary | ICD-10-CM

## 2021-04-09 DIAGNOSIS — D500 Iron deficiency anemia secondary to blood loss (chronic): Secondary | ICD-10-CM

## 2021-04-09 DIAGNOSIS — F339 Major depressive disorder, recurrent, unspecified: Secondary | ICD-10-CM | POA: Diagnosis not present

## 2021-04-09 DIAGNOSIS — G309 Alzheimer's disease, unspecified: Secondary | ICD-10-CM

## 2021-04-09 DIAGNOSIS — N1832 Chronic kidney disease, stage 3b: Secondary | ICD-10-CM | POA: Diagnosis not present

## 2021-04-09 DIAGNOSIS — R7989 Other specified abnormal findings of blood chemistry: Secondary | ICD-10-CM

## 2021-04-09 NOTE — Progress Notes (Addendum)
Location:   [facility] Room Number: 907 Place of Service:  ALF [phone]) Provider:  Marlana Latus NP  Virgie Dad, MD  Patient Care Team: Virgie Dad, MD as PCP - General (Internal Medicine) Elouise Munroe, MD as PCP - Cardiology (Cardiology)  Extended Emergency Contact Information Primary Emergency Contact: Weeks,[name] Address: [address]          [place], [place] [address] [name] of [place] Phone: [phone] Mobile Phone: [phone] Relation: Daughter Secondary Emergency Contact: [name], [place] Mobile Phone: [phone] Relation: Daughter  Code Status:  Full Code Goals of care: Advanced Directive information Advanced Directives 12/21/2020  Does Patient Have a Medical Advance Directive? Yes  Type of Paramedic of [place];Living will  Does patient want to make changes to medical advance directive? No - Patient declined  Copy of [place] in Chart? Yes - validated most recent copy scanned in chart (See row information)  Would patient like information on creating a medical advance directive? -     Chief Complaint  Patient presents with  . Acute Visit    s/p fall    HPI:  Pt is a 87 y.o. male seen today for an acute visit for s/p fall, POA requesting UA C/S. The patient denied urinary frequency, went to bathroom x1 last night, denied burning sensation upon urination, lower abd/back pain. The patient stated his appetite remains no change, denied nausea, vomiting, HA, cough, or SOB. Will obtain CBC/diff, observe for s/s of UTI  Anemia, takes Iron, Hgb 9.0 02/13/21  CAD, NSTEMI stent f/u cardiology. Hospital stay 03/11/20-03/16/20 for NSTEMI, troponin 300s at ED, Cath showed LAD stenosis, underwent drug eluting stent, EF 55-60%, takes Atorvastatin, ASA,, Added Isosorbide 32m since last chest pain 05/01/20. 02/13/21 ED for chest pain, f/u  Cardiology 04/06/21, took him off Plavix.              HTN, controlled, off Metoprolol.             GERD, takes Pantoprazole             AI, EF 55-60%             CKD stage 3, Bun/creat 30/1.59 02/13/21  Alzheimer's dementia, didn't tolerate memory meds, underwent neurology eval in the past.             Peripheral neuropathy, reduced Gabapentin 2023mtid since 11/12/20 after fall, stable, will reduce Gabapentin to 20048mid in setting of no pain complained and falling              Depression takes Sertraline             Gait abnormality, multiple factorials, working with therapy, risk of falling is high.   Past Medical History:  Diagnosis Date  . Anemia   . Arthritis    arthritis,osteopenia,"spinal stenosis"  . Bladder neck obstruction 03/28/2011  . CAD (coronary artery disease)   . [facility]   Prostate cancer'98  . CKD (chronic kidney disease) stage 3, GFR 30-59 ml/min (HCC) 08/17/2017  . Depression   . Essential hypertension, benign 04/10/2009  . GERD (gastroesophageal reflux disease)   . GERD without esophagitis 11/05/2016  . H/O hiatal hernia   . H/O vertigo 12-06-12   none recent  . Hyperlipidemia   . IBS (irritable bowel syndrome)   . Mixed hyperlipidemia 04/10/2009  .  Neuropathy   . Osteopenia   . Peripheral neuropathy 12-06-12   peripheral neuropathy  . Rhinitis   . RLS (restless legs syndrome)   . Vascular dementia ([place]) 09/30/2019  . Vitamin B12 deficiency    Past Surgical History:  Procedure Laterality Date  . APPENDECTOMY    . CARPAL TUNNEL RELEASE     both hands  . cataract surgery Left 12-06-12   recent surgery  . CHOLECYSTECTOMY    . COLONOSCOPY WITH PROPOFOL N/A 12/25/2012   Procedure: COLONOSCOPY WITH PROPOFOL;  Surgeon: Garlan Fair, MD;  Location: [facility];  Service: Endoscopy;  Laterality: N/A;  . CORONARY STENT INTERVENTION N/A 03/13/2020   Procedure: CORONARY STENT INTERVENTION;  Surgeon: Nelva Bush, MD;  Location: [facility];  Service: Cardiovascular;  Laterality: N/A;  . ESOPHAGOGASTRODUODENOSCOPY (EGD) WITH PROPOFOL N/A 12/25/2012   Procedure: ESOPHAGOGASTRODUODENOSCOPY (EGD) WITH PROPOFOL;  Surgeon: Garlan Fair, MD;  Location: [facility];  Service: Endoscopy;  Laterality: N/A;  . INTRAVASCULAR ULTRASOUND/IVUS N/A 03/13/2020   Procedure: Intravascular Ultrasound/IVUS;  Surgeon: Nelva Bush, MD;  Location: [facility];  Service: Cardiovascular;  Laterality: N/A;  . LEFT HEART CATH AND CORONARY ANGIOGRAPHY N/A 03/13/2020   Procedure: LEFT HEART CATH AND CORONARY ANGIOGRAPHY;  Surgeon: Nelva Bush, MD;  Location: [facility];  Service: Cardiovascular;  Laterality: N/A;  . PROSTATE SURGERY  12-06-12  . TONSILLECTOMY    . TOTAL KNEE ARTHROPLASTY Right 11/25/2014   Procedure: RIGHT TOTAL KNEE ARTHROPLASTY;  Surgeon: Melrose Nakayama, MD;  Location: [facility];  Service: Orthopedics;  Laterality: Right;    Allergies  Allergen Reactions  . Niacin Itching  . Penicillins Other (See Comments)    "rash"   . Tramadol Itching    Allergies as of 04/09/2021       Reactions   Niacin Itching   Penicillins Other (See Comments)   "rash"   Tramadol Itching        Medication List        Accurate as of April 09, 2021 11:59 PM. If you have any questions, ask your nurse or doctor.          aspirin EC 81 MG tablet Take 81 mg by mouth daily. Swallow whole.   atorvastatin 80 MG tablet Commonly known as: LIPITOR Take 1 tablet (80 mg total) by mouth daily.   gabapentin 100 MG capsule Commonly known as: NEURONTIN Take 200 mg by mouth 3 (three) times daily.   Iron (Ferrous Sulfate) 325 (65 Fe) MG Tabs Take 1 tablet by mouth. Mon and Fri   isosorbide mononitrate 30 MG 24 hr tablet Commonly known as: IMDUR Take 30 mg by mouth daily. Take 1/2 tab =15 mg, oral, Once A Day, **HOLD IF SBP IS LESS THAN 100**   lactose free nutrition Liqd Take 237 mLs by mouth daily.   nitroGLYCERIN 0.4  MG SL tablet Commonly known as: NITROSTAT Place 0.4 mg under the tongue every 5 (five) minutes as needed for chest pain.   pantoprazole 40 MG tablet Commonly known as: PROTONIX Take 40 mg by mouth 2 (two) times daily.   sertraline 25 MG tablet Commonly known as: ZOLOFT Take 25 mg by mouth daily. Take with 100 mg tab to total 125 mg daily.   sertraline 100 MG tablet Commonly known as: ZOLOFT 1 tablet Daily for Mood   Vitamin D 50 MCG (2000 UT) tablet Take 1 pill daily.        Review of Systems  Constitutional:  Negative for activity change, appetite change and fever.  HENT:  Positive for hearing loss. Negative for congestion and voice change.   Eyes:  Negative for visual disturbance.  Respiratory:  Negative for shortness of breath.   Cardiovascular:  Negative for chest pain, palpitations and leg swelling.  Gastrointestinal:  Negative for abdominal pain and constipation.  Genitourinary:  Negative for dysuria, frequency, hematuria and urgency.  Musculoskeletal:  Positive for gait problem.  Neurological:  Negative for speech difficulty, weakness and light-headedness.       Memory lapses.   Psychiatric/Behavioral:  Positive for confusion. Negative for sleep disturbance. The patient is not nervous/anxious.    Immunization History  Administered Date(s) Administered  . Influenza, High Dose Seasonal PF 03/05/2015, 03/05/2018  . Influenza-Unspecified 04/22/2016, 03/13/2017, 06/13/2018, 03/28/2019  . Moderna Sars-Covid-2 Vaccination 06/17/2019, 07/15/2019, 04/21/2020  . Pneumococcal Conjugate-13 05/23/2017  . Pneumococcal Polysaccharide-23 01/20/2016   Pertinent  Health Maintenance Due  Topic Date Due  . INFLUENZA VACCINE  01/11/2021   Fall Risk 03/16/2020 03/16/2020 05/01/2020 05/02/2020 02/13/2021  Falls in the past year? - - - - -  Was there an injury with Fall? - - - - -  Fall Risk Category Calculator - - - - -  Fall Risk Category - - - - -  Patient Fall Risk Level Moderate  fall risk Moderate fall risk High fall risk Moderate fall risk Low fall risk  Patient at Risk for Falls Due to - - - - -  Fall risk Follow up - - - - -   Functional Status Survey:    Vitals:   04/09/21 0823  BP: 138/80  Pulse: 69  Resp: 19  Temp: 98 F (36.7 C)  SpO2: 94%  Weight: 148 lb 9.6 oz (67.4 kg)  Height: 6' (1.829 m)   Body mass index is 20.15 kg/m. Physical Exam Vitals and nursing note reviewed.  Constitutional:      Appearance: Normal appearance.  HENT:     Head: Normocephalic and atraumatic.     Mouth/Throat:     Mouth: Mucous membranes are moist.  Eyes:     Extraocular Movements: Extraocular movements intact.     Conjunctiva/sclera: Conjunctivae normal.     Pupils: Pupils are equal, round, and reactive to light.  Cardiovascular:     Rate and Rhythm: Normal rate and regular rhythm.     Heart sounds: No murmur heard. Pulmonary:     Effort: Pulmonary effort is normal.     Breath sounds: No wheezing.  Abdominal:     General: Bowel sounds are normal.     Palpations: Abdomen is soft.     Tenderness: There is no abdominal tenderness. There is no right CVA tenderness, left CVA tenderness, guarding or rebound.  Musculoskeletal:     Cervical back: Normal range of motion and neck supple.     Right lower leg: No edema.     Left lower leg: No edema.     Comments: Minimal edema BLE  Skin:    General: Skin is warm and dry.     Findings: Bruising present.     Comments: Skin tear left forearm is well approximated with steri strips, no active bleeding or s/s infection. A palm sized bruise noted lateral left upper thigh, a hand sized bruise right mid back, no pain with deep breathing.   Neurological:     General: No focal deficit present.     Mental Status: He is alert. Mental status is at baseline.  Motor: No weakness.     Gait: Gait abnormal.     Comments: Oriented to person, place  Psychiatric:        Behavior: Behavior normal.     Comments: Appears anxious,  stating his clothes are missing.     Labs reviewed: Recent Labs    05/01/20 2220 06/18/20 0000 11/18/20 0000 12/23/20 0000 02/13/21 0509  NA 141   < > 142 142 140  K 4.9   < > 4.1 4.5 4.6  CL 111   < > 106 108 107  CO2 21*   < > 24* 26* 28  GLUCOSE 114*  --   --   --  100*  BUN 32*   < > 30* 31* 30*  CREATININE 1.82*   < > 1.6* 1.6* 1.59*  CALCIUM 8.5*   < > 8.9 8.7 9.0   < > = values in this interval not displayed.   Recent Labs    06/18/20 0000 09/01/20 0000 12/23/20 0000  AST 12* 12* 13*  ALT 9* 8* 11  ALKPHOS 82 71 69  ALBUMIN 4.6 4.2 3.8   Recent Labs    05/01/20 2220 06/18/20 0000 11/18/20 0000 11/26/20 0000 12/23/20 0000 02/13/21 0509  WBC 8.3   < > 6.1 7.4 6.9 7.8  NEUTROABS  --    < > 4,801.00  --  5,396.00 6.2  HGB 8.8*   < > 8.0* 9.3* 8.9* 9.0*  HCT 27.8*   < > 25* 29* 28* 28.6*  MCV 101.8*  --   --   --   --  97.3  PLT 159   < > 138* 141* 100* 133*   < > = values in this interval not displayed.   Lab Results  Component Value Date   TSH 3.715 03/12/2020   Lab Results  Component Value Date   HGBA1C 5.1 09/10/2019   Lab Results  Component Value Date   CHOL 171 02/26/2020   HDL 41 02/26/2020   LDLCALC 104 (H) 02/26/2020   TRIG 151 (H) 02/26/2020   CHOLHDL 4.2 02/26/2020    Significant Diagnostic Results in last 30 days:  No results found.  Assessment/Plan Alzheimer's dementia without behavioral disturbance ([place]) didn't tolerate memory meds, underwent neurology eval in the past. POA reported the patient's increased confusion and recent fall, will update CBC/diff, CMP/eGFR, lipid panel, TSH. Observe for s/s of UTI since the patient's POA requesting UA C/S in setting of c/o the patient's increased confusion.   Hereditary and idiopathic peripheral neuropathy reduced Gabapentin 228m tid since 11/12/20 after fall, stable, will reduce Gabapentin to 2060mbid in setting of no pain complained and falling   Depression, recurrent (HCC) His mood  is stable, continue Sertraline.   Gait abnormality Gait abnormality, multiple factorials, working with therapy, risk of falling is high.    CKD (chronic kidney disease) stage 3, GFR 30-59 ml/min (HCC) Bun/creat 39/1.59 02/13/21  NSTEMI (non-ST elevated myocardial infarction) (HCPeachEF 55-60%, NSTEMI stent f/u cardiology. Hospital stay 03/11/20-03/16/20 for NSTEMI, troponin 300s at ED, Cath showed LAD stenosis, underwent drug eluting stent, EF 55-60%, takes Atorvastatin, ASA,, Added Isosorbide 1549mince last chest pain 05/01/20  Essential hypertension, benign Controlled, no med  GERD without esophagitis Stable, takes Pantoprazole.   Iron deficiency anemia takes Iron, Hgb 9.0 02/13/21   Family/ staff Communication: plan of care reviewed with the patient and charge nurse.   Labs/tests ordered:   CBC/diff, CMP/eGFR, lipid panel, TSH  Time spend 40 minutes.

## 2021-04-09 NOTE — Assessment & Plan Note (Signed)
His mood is stable, continue Sertraline.

## 2021-04-09 NOTE — Assessment & Plan Note (Signed)
Gait abnormality, multiple factorials, working with therapy, risk of falling is high.

## 2021-04-09 NOTE — Assessment & Plan Note (Signed)
reduced Gabapentin 200mg  tid since 11/12/20 after fall, stable, will reduce Gabapentin to 200mg  bid in setting of no pain complained and falling

## 2021-04-09 NOTE — Assessment & Plan Note (Addendum)
takes Iron, Hgb 9.0 02/13/21

## 2021-04-09 NOTE — Assessment & Plan Note (Signed)
EF 55-60%, NSTEMI stent f/u cardiology. Hospital stay 03/11/20-03/16/20 for NSTEMI, troponin 300s at ED, Cath showed LAD stenosis, underwent drug eluting stent, EF 55-60%, takes Atorvastatin, ASA,, Added Isosorbide 15mg  since last chest pain 05/01/20

## 2021-04-09 NOTE — Assessment & Plan Note (Addendum)
Bun/creat 39/1.59 02/13/21

## 2021-04-09 NOTE — Assessment & Plan Note (Addendum)
didn't tolerate memory meds, underwent neurology eval in the past. POA reported the patient's increased confusion and recent fall, will update CBC/diff, CMP/eGFR, lipid panel, TSH. Observe for s/s of UTI since the patient's POA requesting UA C/S in setting of c/o the patient's increased confusion.

## 2021-04-09 NOTE — Assessment & Plan Note (Signed)
Controlled, no med

## 2021-04-09 NOTE — Assessment & Plan Note (Signed)
Stable, takes Pantoprazole 

## 2021-04-12 DIAGNOSIS — R2689 Other abnormalities of gait and mobility: Secondary | ICD-10-CM | POA: Diagnosis not present

## 2021-04-12 DIAGNOSIS — Z9181 History of falling: Secondary | ICD-10-CM | POA: Diagnosis not present

## 2021-04-13 DIAGNOSIS — D649 Anemia, unspecified: Secondary | ICD-10-CM | POA: Diagnosis not present

## 2021-04-13 DIAGNOSIS — Z9181 History of falling: Secondary | ICD-10-CM | POA: Diagnosis not present

## 2021-04-13 DIAGNOSIS — I10 Essential (primary) hypertension: Secondary | ICD-10-CM | POA: Diagnosis not present

## 2021-04-13 DIAGNOSIS — R2689 Other abnormalities of gait and mobility: Secondary | ICD-10-CM | POA: Diagnosis not present

## 2021-04-13 LAB — COMPREHENSIVE METABOLIC PANEL
Albumin: 4.6 (ref 3.5–5.0)
Calcium: 9.6 (ref 8.7–10.7)
Globulin: 2.3

## 2021-04-13 LAB — CBC: RBC: 3.74 — AB (ref 3.87–5.11)

## 2021-04-13 LAB — BASIC METABOLIC PANEL
BUN: 30 — AB (ref 4–21)
CO2: 26 — AB (ref 13–22)
Chloride: 106 (ref 99–108)
Creatinine: 1.6 — AB (ref 0.6–1.3)
Glucose: 82
Potassium: 4.3 (ref 3.4–5.3)
Sodium: 144 (ref 137–147)

## 2021-04-13 LAB — CBC AND DIFFERENTIAL
HCT: 35 — AB (ref 41–53)
Hemoglobin: 11.3 — AB (ref 13.5–17.5)
Platelets: 151 (ref 150–399)
WBC: 8.6

## 2021-04-13 LAB — LIPID PANEL
Cholesterol: 114 (ref 0–200)
HDL: 37 (ref 35–70)
LDL Cholesterol: 59
LDl/HDL Ratio: 3.1
Triglycerides: 101 (ref 40–160)

## 2021-04-13 LAB — HEPATIC FUNCTION PANEL
ALT: 25 (ref 10–40)
AST: 25 (ref 14–40)
Alkaline Phosphatase: 92 (ref 25–125)
Bilirubin, Total: 0.6

## 2021-04-15 DIAGNOSIS — R7989 Other specified abnormal findings of blood chemistry: Secondary | ICD-10-CM | POA: Insufficient documentation

## 2021-04-15 DIAGNOSIS — Z9181 History of falling: Secondary | ICD-10-CM | POA: Diagnosis not present

## 2021-04-15 DIAGNOSIS — R2689 Other abnormalities of gait and mobility: Secondary | ICD-10-CM | POA: Diagnosis not present

## 2021-04-15 NOTE — Assessment & Plan Note (Signed)
04/13/21 TSH 5.12, repeat TSH 3 months.

## 2021-04-16 DIAGNOSIS — R2689 Other abnormalities of gait and mobility: Secondary | ICD-10-CM | POA: Diagnosis not present

## 2021-04-16 DIAGNOSIS — Z9181 History of falling: Secondary | ICD-10-CM | POA: Diagnosis not present

## 2021-04-20 DIAGNOSIS — R2689 Other abnormalities of gait and mobility: Secondary | ICD-10-CM | POA: Diagnosis not present

## 2021-04-20 DIAGNOSIS — Z9181 History of falling: Secondary | ICD-10-CM | POA: Diagnosis not present

## 2021-04-22 DIAGNOSIS — Z9181 History of falling: Secondary | ICD-10-CM | POA: Diagnosis not present

## 2021-04-22 DIAGNOSIS — R2689 Other abnormalities of gait and mobility: Secondary | ICD-10-CM | POA: Diagnosis not present

## 2021-04-23 DIAGNOSIS — Z9181 History of falling: Secondary | ICD-10-CM | POA: Diagnosis not present

## 2021-04-23 DIAGNOSIS — R2689 Other abnormalities of gait and mobility: Secondary | ICD-10-CM | POA: Diagnosis not present

## 2021-04-27 DIAGNOSIS — R2689 Other abnormalities of gait and mobility: Secondary | ICD-10-CM | POA: Diagnosis not present

## 2021-04-27 DIAGNOSIS — Z9181 History of falling: Secondary | ICD-10-CM | POA: Diagnosis not present

## 2021-04-28 DIAGNOSIS — Z9181 History of falling: Secondary | ICD-10-CM | POA: Diagnosis not present

## 2021-04-28 DIAGNOSIS — R2689 Other abnormalities of gait and mobility: Secondary | ICD-10-CM | POA: Diagnosis not present

## 2021-04-29 DIAGNOSIS — R2689 Other abnormalities of gait and mobility: Secondary | ICD-10-CM | POA: Diagnosis not present

## 2021-04-29 DIAGNOSIS — Z9181 History of falling: Secondary | ICD-10-CM | POA: Diagnosis not present

## 2021-05-03 DIAGNOSIS — Z20828 Contact with and (suspected) exposure to other viral communicable diseases: Secondary | ICD-10-CM | POA: Diagnosis not present

## 2021-05-04 DIAGNOSIS — R2689 Other abnormalities of gait and mobility: Secondary | ICD-10-CM | POA: Diagnosis not present

## 2021-05-04 DIAGNOSIS — Z9181 History of falling: Secondary | ICD-10-CM | POA: Diagnosis not present

## 2021-05-05 DIAGNOSIS — Z9181 History of falling: Secondary | ICD-10-CM | POA: Diagnosis not present

## 2021-05-05 DIAGNOSIS — R2689 Other abnormalities of gait and mobility: Secondary | ICD-10-CM | POA: Diagnosis not present

## 2021-05-10 DIAGNOSIS — Z9181 History of falling: Secondary | ICD-10-CM | POA: Diagnosis not present

## 2021-05-10 DIAGNOSIS — R2689 Other abnormalities of gait and mobility: Secondary | ICD-10-CM | POA: Diagnosis not present

## 2021-05-12 DIAGNOSIS — R2689 Other abnormalities of gait and mobility: Secondary | ICD-10-CM | POA: Diagnosis not present

## 2021-05-12 DIAGNOSIS — Z9181 History of falling: Secondary | ICD-10-CM | POA: Diagnosis not present

## 2021-05-13 DIAGNOSIS — Z9181 History of falling: Secondary | ICD-10-CM | POA: Diagnosis not present

## 2021-05-13 DIAGNOSIS — M6281 Muscle weakness (generalized): Secondary | ICD-10-CM | POA: Diagnosis not present

## 2021-05-13 DIAGNOSIS — R2689 Other abnormalities of gait and mobility: Secondary | ICD-10-CM | POA: Diagnosis not present

## 2021-05-13 DIAGNOSIS — R296 Repeated falls: Secondary | ICD-10-CM | POA: Diagnosis not present

## 2021-05-17 ENCOUNTER — Encounter: Payer: Self-pay | Admitting: Orthopedic Surgery

## 2021-05-17 ENCOUNTER — Non-Acute Institutional Stay: Payer: Medicare Other | Admitting: Orthopedic Surgery

## 2021-05-17 DIAGNOSIS — R269 Unspecified abnormalities of gait and mobility: Secondary | ICD-10-CM

## 2021-05-17 DIAGNOSIS — I214 Non-ST elevation (NSTEMI) myocardial infarction: Secondary | ICD-10-CM | POA: Diagnosis not present

## 2021-05-17 DIAGNOSIS — I10 Essential (primary) hypertension: Secondary | ICD-10-CM | POA: Diagnosis not present

## 2021-05-17 DIAGNOSIS — K219 Gastro-esophageal reflux disease without esophagitis: Secondary | ICD-10-CM | POA: Diagnosis not present

## 2021-05-17 DIAGNOSIS — R079 Chest pain, unspecified: Secondary | ICD-10-CM

## 2021-05-17 DIAGNOSIS — F339 Major depressive disorder, recurrent, unspecified: Secondary | ICD-10-CM | POA: Diagnosis not present

## 2021-05-17 DIAGNOSIS — G609 Hereditary and idiopathic neuropathy, unspecified: Secondary | ICD-10-CM | POA: Diagnosis not present

## 2021-05-17 DIAGNOSIS — M6281 Muscle weakness (generalized): Secondary | ICD-10-CM | POA: Diagnosis not present

## 2021-05-17 DIAGNOSIS — F0280 Dementia in other diseases classified elsewhere without behavioral disturbance: Secondary | ICD-10-CM | POA: Diagnosis not present

## 2021-05-17 DIAGNOSIS — N1832 Chronic kidney disease, stage 3b: Secondary | ICD-10-CM | POA: Diagnosis not present

## 2021-05-17 DIAGNOSIS — Z9181 History of falling: Secondary | ICD-10-CM | POA: Diagnosis not present

## 2021-05-17 DIAGNOSIS — G309 Alzheimer's disease, unspecified: Secondary | ICD-10-CM | POA: Diagnosis not present

## 2021-05-17 DIAGNOSIS — R2689 Other abnormalities of gait and mobility: Secondary | ICD-10-CM | POA: Diagnosis not present

## 2021-05-17 DIAGNOSIS — R296 Repeated falls: Secondary | ICD-10-CM | POA: Diagnosis not present

## 2021-05-17 NOTE — Progress Notes (Signed)
Location:   [place] Room Number: [place] of Service:  ALF [phone]) Provider:  Windell Moulding, NP  Virgie Dad, MD  Patient Care Team: Virgie Dad, MD as PCP - General (Internal Medicine) Elouise Munroe, MD as PCP - Cardiology (Cardiology)  Extended Emergency Contact Information Primary Emergency Contact: Weeks,[name] Address: [address]          [place], [place] [address] [name] of [place] Phone: [phone] Mobile Phone: [phone] Relation: Daughter Secondary Emergency Contact: [name], [place] of [place] Mobile Phone: [phone] Relation: Daughter  Code Status:  FULL CODE Goals of care: Advanced Directive information Advanced Directives 05/17/2021  Does Patient Have a Medical Advance Directive? Yes  Type of Paramedic of [place];Living will  Does patient want to make changes to medical advance directive? No - Patient declined  Copy of [place] in Chart? Yes - validated most recent copy scanned in chart (See row information)  Would patient like information on creating a medical advance directive? -     Chief Complaint  Patient presents with   Acute Visit    Back pain    HPI:  Pt is a 87 y.o. male seen today for an acute visit for right chest pain.   He currently resides on the assisted living unit at [facility]. PMH includes: CAD, HTN, NSTEMI, GERD, AD, tremor, peripheral neuropathy, OA right knee, CKD stage 3, prostate cancer, anemia, depression and recurrent falls.   He reports right sided chest pain and pain to right middle back. He is a poor historian due to AD, but believes he fell last night and bumped his side. No apparent bruising. Confirms pain when taking a deep breath. He has been given tylenol 650 mg prn for pain without success. Ambulates with walker, but forgot to use it during our encounter. Denies constant chest  pain, sob, arm pain, or nausea.   Dementia- no recent behavioral outbursts, continues to have poor safety awareness, ambulating with walker, MRI brain 2020 unchanged  generalized volume loss and chronic microvascular ischemia CAD/NSTEMI- followed by cardiology, NSTEMI 03/11/20- 03/16/20- stent placed, EF 55-60%, ED visit 02/2021 for chest pain- Plavix d/c by cardiology at f/u, remains on statin, aspirin, and Imdur HTN- BUN/creat 31/1.6 12/23/2020, metoprolol discontinued earlier this year due to hypotension GERD- hgb 9.0 02/13/2021, remains on Protonix CKD stage 3- BUN/creat 31/1.6 12/23/2020 Neuropathy- remains on gabapentin  Depression- stable mood, remains on Zoloft  Recent blood pressures:  12/05- 102/59, 124/60  12/04- 118/62  12/03- 130/66       Past Medical History:  Diagnosis Date   Anemia    Arthritis    arthritis,osteopenia,"spinal stenosis"   Bladder neck obstruction 03/28/2011   CAD (coronary artery disease)    Cancer ([place]) 12-06-12   Prostate cancer'98   CKD (chronic kidney disease) stage 3, GFR 30-59 ml/min ([place]) 08/17/2017   Depression    Essential hypertension, benign 04/10/2009   GERD (gastroesophageal reflux disease)    GERD without esophagitis 11/05/2016   H/O hiatal hernia    H/O vertigo 12-06-12   none recent   Hyperlipidemia    IBS (irritable bowel syndrome)    Mixed hyperlipidemia 04/10/2009   Neuropathy    Osteopenia    Peripheral neuropathy 12-06-12   peripheral neuropathy   Rhinitis    RLS (restless legs syndrome)    Vascular dementia ([place]) 09/30/2019  Vitamin B12 deficiency    Past Surgical History:  Procedure Laterality Date   APPENDECTOMY     CARPAL TUNNEL RELEASE     both hands   cataract surgery Left 12-06-12   recent surgery   CHOLECYSTECTOMY     COLONOSCOPY WITH PROPOFOL N/A 12/25/2012   Procedure: COLONOSCOPY WITH PROPOFOL;  Surgeon: Garlan Fair, MD;  Location: [facility];  Service: Endoscopy;  Laterality: N/A;   CORONARY  STENT INTERVENTION N/A 03/13/2020   Procedure: CORONARY STENT INTERVENTION;  Surgeon: Nelva Bush, MD;  Location: [facility];  Service: Cardiovascular;  Laterality: N/A;   ESOPHAGOGASTRODUODENOSCOPY (EGD) WITH PROPOFOL N/A 12/25/2012   Procedure: ESOPHAGOGASTRODUODENOSCOPY (EGD) WITH PROPOFOL;  Surgeon: Garlan Fair, MD;  Location: [facility];  Service: Endoscopy;  Laterality: N/A;   INTRAVASCULAR ULTRASOUND/IVUS N/A 03/13/2020   Procedure: Intravascular Ultrasound/IVUS;  Surgeon: Nelva Bush, MD;  Location: [facility];  Service: Cardiovascular;  Laterality: N/A;   LEFT HEART CATH AND CORONARY ANGIOGRAPHY N/A 03/13/2020   Procedure: LEFT HEART CATH AND CORONARY ANGIOGRAPHY;  Surgeon: Nelva Bush, MD;  Location: [facility];  Service: Cardiovascular;  Laterality: N/A;   PROSTATE SURGERY  12-06-12   TONSILLECTOMY     TOTAL KNEE ARTHROPLASTY Right 11/25/2014   Procedure: RIGHT TOTAL KNEE ARTHROPLASTY;  Surgeon: Melrose Nakayama, MD;  Location: [facility];  Service: Orthopedics;  Laterality: Right;    Allergies  Allergen Reactions   Niacin Itching   Penicillins Other (See Comments)    "rash"    Tramadol Itching    Allergies as of 05/17/2021       Reactions   Niacin Itching   Penicillins Other (See Comments)   "rash"   Tramadol Itching        Medication List        Accurate as of May 17, 2021  3:46 PM. If you have any questions, ask your nurse or doctor.          acetaminophen 325 MG tablet Commonly known as: TYLENOL Take 650 mg by mouth every 4 (four) hours as needed.   aspirin EC 81 MG tablet Take 81 mg by mouth daily. Swallow whole.   atorvastatin 80 MG tablet Commonly known as: LIPITOR Take 1 tablet (80 mg total) by mouth daily.   gabapentin 100 MG capsule Commonly known as: NEURONTIN Take 200 mg by mouth 2 (two) times daily.   Iron (Ferrous Sulfate) 325 (65 Fe) MG Tabs Take 1 tablet by mouth. Mon and Fri   isosorbide  mononitrate 30 MG 24 hr tablet Commonly known as: IMDUR Take 30 mg by mouth daily. Take 1/2 tab =15 mg, oral, Once A Day, **HOLD IF SBP IS LESS THAN 100**   lactose free nutrition Liqd Take 237 mLs by mouth daily.   nitroGLYCERIN 0.4 MG SL tablet Commonly known as: NITROSTAT Place 0.4 mg under the tongue every 5 (five) minutes as needed for chest pain.   pantoprazole 40 MG tablet Commonly known as: PROTONIX Take 40 mg by mouth 2 (two) times daily.   sertraline 25 MG tablet Commonly known as: ZOLOFT Take 25 mg by mouth daily. Take with 100 mg tab to total 125 mg daily.   sertraline 100 MG tablet Commonly known as: ZOLOFT 1 tablet Daily for Mood   Vitamin D 50 MCG (2000 UT) tablet Take 1 pill daily.        Review of Systems  Unable to perform ROS: Dementia   Immunization History  Administered Date(s)  Administered   Influenza, High Dose Seasonal PF 03/05/2015, 03/05/2018   Influenza-Unspecified 04/22/2016, 03/13/2017, 06/13/2018, 03/28/2019   Moderna Sars-Covid-2 Vaccination 06/17/2019, 07/15/2019, 04/21/2020   Pneumococcal Conjugate-13 05/23/2017   Pneumococcal Polysaccharide-23 01/20/2016   Pertinent  Health Maintenance Due  Topic Date Due   INFLUENZA VACCINE  01/11/2021   Fall Risk 03/16/2020 03/16/2020 05/01/2020 05/02/2020 02/13/2021  Falls in the past year? - - - - -  Was there an injury with Fall? - - - - -  Fall Risk Category Calculator - - - - -  Fall Risk Category - - - - -  Patient Fall Risk Level Moderate fall risk Moderate fall risk High fall risk Moderate fall risk Low fall risk  Patient at Risk for Falls Due to - - - - -  Fall risk Follow up - - - - -   Functional Status Survey:    Vitals:   05/17/21 1535  BP: 124/60  Pulse: 83  Resp: 15  Temp: 98 F (36.7 C)  SpO2: 96%  Weight: 148 lb 9.6 oz (67.4 kg)  Height: 6' (1.829 m)   Body mass index is 20.15 kg/m. Physical Exam Vitals reviewed.  Constitutional:      General: He is not in acute  distress. HENT:     Head: Normocephalic.  Eyes:     General:        Right eye: No discharge.        Left eye: No discharge.  Neck:     Vascular: No carotid bruit.  Cardiovascular:     Rate and Rhythm: Normal rate and regular rhythm.     Pulses: Normal pulses.     Heart sounds: Normal heart sounds. No murmur heard.    Comments: No bruising to chest Pulmonary:     Effort: Pulmonary effort is normal. No respiratory distress.     Breath sounds: Normal breath sounds. No wheezing.  Abdominal:     General: Bowel sounds are normal. There is no distension.     Palpations: Abdomen is soft.     Tenderness: There is no abdominal tenderness.  Musculoskeletal:     Cervical back: Normal and normal range of motion.     Thoracic back: Normal.     Lumbar back: Normal.     Right lower leg: No edema.     Left lower leg: No edema.  Lymphadenopathy:     Cervical: No cervical adenopathy.  Skin:    General: Skin is warm and dry.     Capillary Refill: Capillary refill takes less than 2 seconds.  Neurological:     General: No focal deficit present.     Mental Status: He is alert. Mental status is at baseline.     Motor: Weakness present.     Gait: Gait abnormal.     Comments: walker  Psychiatric:        Mood and Affect: Mood normal.        Behavior: Behavior normal.    Labs reviewed: Recent Labs    11/18/20 0000 12/23/20 0000 02/13/21 0509  NA 142 142 140  K 4.1 4.5 4.6  CL 106 108 107  CO2 24* 26* 28  GLUCOSE  --   --  100*  BUN 30* 31* 30*  CREATININE 1.6* 1.6* 1.59*  CALCIUM 8.9 8.7 9.0   Recent Labs    06/18/20 0000 09/01/20 0000 12/23/20 0000  AST 12* 12* 13*  ALT 9* 8* 11  ALKPHOS 82 71 69  ALBUMIN  4.6 4.2 3.8   Recent Labs    11/18/20 0000 11/26/20 0000 12/23/20 0000 02/13/21 0509  WBC 6.1 7.4 6.9 7.8  NEUTROABS 4,801.00  --  5,396.00 6.2  HGB 8.0* 9.3* 8.9* 9.0*  HCT 25* 29* 28* 28.6*  MCV  --   --   --  97.3  PLT 138* 141* 100* 133*   Lab Results   Component Value Date   TSH 3.715 03/12/2020   Lab Results  Component Value Date   HGBA1C 5.1 09/10/2019   Lab Results  Component Value Date   CHOL 171 02/26/2020   HDL 41 02/26/2020   LDLCALC 104 (H) 02/26/2020   TRIG 151 (H) 02/26/2020   CHOLHDL 4.2 02/26/2020    Significant Diagnostic Results in last 30 days:  No results found.  Assessment/Plan 1. Right-sided chest pain - poor historian due to AD, reports fall  - no bruising to chest - pain with deep breaths - exam to back unremarkable - CXR to r/o rib fracture - start tylenol 1000 mg po bid x 1 week, then bid prn  2. Alzheimer's dementia without behavioral disturbance ([place]) - no recent behavioral outbursts - poor safety awareness  3. NSTEMI (non-ST elevated myocardial infarction) [facility]) - followed by cardiology - hospitalized 03/11/2020- 03/16/2020- stent placed - EF 55-60% - cont aspirin, statin and imdur  4. Essential hypertension, benign - controlled - off metoprolol due to hypotension  5. GERD without esophagitis - hgb stable - cont Protonix  6. Stage 3b chronic kidney disease ([place]) - cont to avoid nephrotoxic drugs like NSAIDS and dose adjust medications to be renally excreted - encourage hydration with water  7. Hereditary and idiopathic peripheral neuropathy - cont gabapentin  8. Depression, recurrent ([place]) - cont zoloft  9. Gait abnormality - continues to fall, forgets to use walker - completed PT October 2022 - cont falls safety precautions    Family/ staff Communication: plan discussed with patient and nurse  Labs/tests ordered:   CXR

## 2021-05-18 DIAGNOSIS — R0989 Other specified symptoms and signs involving the circulatory and respiratory systems: Secondary | ICD-10-CM | POA: Diagnosis not present

## 2021-05-18 DIAGNOSIS — R079 Chest pain, unspecified: Secondary | ICD-10-CM | POA: Diagnosis not present

## 2021-05-19 DIAGNOSIS — M6281 Muscle weakness (generalized): Secondary | ICD-10-CM | POA: Diagnosis not present

## 2021-05-19 DIAGNOSIS — Z9181 History of falling: Secondary | ICD-10-CM | POA: Diagnosis not present

## 2021-05-19 DIAGNOSIS — R2689 Other abnormalities of gait and mobility: Secondary | ICD-10-CM | POA: Diagnosis not present

## 2021-05-19 DIAGNOSIS — R296 Repeated falls: Secondary | ICD-10-CM | POA: Diagnosis not present

## 2021-05-20 DIAGNOSIS — M6281 Muscle weakness (generalized): Secondary | ICD-10-CM | POA: Diagnosis not present

## 2021-05-20 DIAGNOSIS — R2689 Other abnormalities of gait and mobility: Secondary | ICD-10-CM | POA: Diagnosis not present

## 2021-05-20 DIAGNOSIS — R296 Repeated falls: Secondary | ICD-10-CM | POA: Diagnosis not present

## 2021-05-20 DIAGNOSIS — Z9181 History of falling: Secondary | ICD-10-CM | POA: Diagnosis not present

## 2021-05-21 DIAGNOSIS — M6281 Muscle weakness (generalized): Secondary | ICD-10-CM | POA: Diagnosis not present

## 2021-05-21 DIAGNOSIS — R2689 Other abnormalities of gait and mobility: Secondary | ICD-10-CM | POA: Diagnosis not present

## 2021-05-21 DIAGNOSIS — Z9181 History of falling: Secondary | ICD-10-CM | POA: Diagnosis not present

## 2021-05-21 DIAGNOSIS — R296 Repeated falls: Secondary | ICD-10-CM | POA: Diagnosis not present

## 2021-05-22 ENCOUNTER — Encounter: Payer: Self-pay | Admitting: Adult Health

## 2021-05-22 ENCOUNTER — Emergency Department (HOSPITAL_COMMUNITY)
Admission: EM | Admit: 2021-05-22 | Discharge: 2021-05-23 | Disposition: A | Discharge status: Home / Self Care | Payer: Medicare Other | Attending: Emergency Medicine | Admitting: Emergency Medicine

## 2021-05-22 ENCOUNTER — Emergency Department (HOSPITAL_COMMUNITY): Payer: Medicare Other

## 2021-05-22 ENCOUNTER — Telehealth: Payer: Self-pay | Admitting: Adult Health

## 2021-05-22 DIAGNOSIS — Z7982 Long term (current) use of aspirin: Secondary | ICD-10-CM | POA: Insufficient documentation

## 2021-05-22 DIAGNOSIS — I2510 Atherosclerotic heart disease of native coronary artery without angina pectoris: Secondary | ICD-10-CM | POA: Diagnosis not present

## 2021-05-22 DIAGNOSIS — I1310 Hypertensive heart and chronic kidney disease without heart failure, with stage 1 through stage 4 chronic kidney disease, or unspecified chronic kidney disease: Secondary | ICD-10-CM | POA: Insufficient documentation

## 2021-05-22 DIAGNOSIS — Z8546 Personal history of malignant neoplasm of prostate: Secondary | ICD-10-CM | POA: Insufficient documentation

## 2021-05-22 DIAGNOSIS — G309 Alzheimer's disease, unspecified: Secondary | ICD-10-CM | POA: Insufficient documentation

## 2021-05-22 DIAGNOSIS — N183 Chronic kidney disease, stage 3 unspecified: Secondary | ICD-10-CM | POA: Diagnosis not present

## 2021-05-22 DIAGNOSIS — I959 Hypotension, unspecified: Secondary | ICD-10-CM | POA: Diagnosis not present

## 2021-05-22 DIAGNOSIS — Z96651 Presence of right artificial knee joint: Secondary | ICD-10-CM | POA: Diagnosis not present

## 2021-05-22 DIAGNOSIS — R0602 Shortness of breath: Secondary | ICD-10-CM | POA: Insufficient documentation

## 2021-05-22 DIAGNOSIS — Z87891 Personal history of nicotine dependence: Secondary | ICD-10-CM | POA: Insufficient documentation

## 2021-05-22 DIAGNOSIS — I4510 Unspecified right bundle-branch block: Secondary | ICD-10-CM | POA: Diagnosis not present

## 2021-05-22 DIAGNOSIS — R404 Transient alteration of awareness: Secondary | ICD-10-CM | POA: Diagnosis not present

## 2021-05-22 DIAGNOSIS — R079 Chest pain, unspecified: Secondary | ICD-10-CM | POA: Insufficient documentation

## 2021-05-22 DIAGNOSIS — Z79899 Other long term (current) drug therapy: Secondary | ICD-10-CM | POA: Insufficient documentation

## 2021-05-22 DIAGNOSIS — S2241XA Multiple fractures of ribs, right side, initial encounter for closed fracture: Secondary | ICD-10-CM | POA: Diagnosis not present

## 2021-05-22 DIAGNOSIS — J90 Pleural effusion, not elsewhere classified: Secondary | ICD-10-CM | POA: Diagnosis not present

## 2021-05-22 DIAGNOSIS — S0990XA Unspecified injury of head, initial encounter: Secondary | ICD-10-CM | POA: Diagnosis not present

## 2021-05-22 DIAGNOSIS — R42 Dizziness and giddiness: Secondary | ICD-10-CM | POA: Insufficient documentation

## 2021-05-22 DIAGNOSIS — F0390 Unspecified dementia without behavioral disturbance: Secondary | ICD-10-CM | POA: Insufficient documentation

## 2021-05-22 DIAGNOSIS — S199XXA Unspecified injury of neck, initial encounter: Secondary | ICD-10-CM | POA: Diagnosis not present

## 2021-05-22 LAB — COMPREHENSIVE METABOLIC PANEL
ALT: 24 U/L (ref 0–44)
AST: 24 U/L (ref 15–41)
Albumin: 3.4 g/dL — ABNORMAL LOW (ref 3.5–5.0)
Alkaline Phosphatase: 76 U/L (ref 38–126)
Anion gap: 9 (ref 5–15)
BUN: 30 mg/dL — ABNORMAL HIGH (ref 8–23)
CO2: 25 mmol/L (ref 22–32)
Calcium: 8.6 mg/dL — ABNORMAL LOW (ref 8.9–10.3)
Chloride: 106 mmol/L (ref 98–111)
Creatinine, Ser: 1.52 mg/dL — ABNORMAL HIGH (ref 0.61–1.24)
GFR, Estimated: 43 mL/min — ABNORMAL LOW (ref >60)
Glucose, Bld: 109 mg/dL — ABNORMAL HIGH (ref 70–99)
Potassium: 4.4 mmol/L (ref 3.5–5.1)
Sodium: 140 mmol/L (ref 135–145)
Total Bilirubin: 0.6 mg/dL (ref 0.3–1.2)
Total Protein: 5.7 g/dL — ABNORMAL LOW (ref 6.5–8.1)

## 2021-05-22 LAB — CBC WITH DIFFERENTIAL/PLATELET
Abs Immature Granulocytes: 0.14 10*3/uL — ABNORMAL HIGH (ref 0.00–0.07)
Basophils Absolute: 0 10*3/uL (ref 0.0–0.1)
Basophils Relative: 0 %
Eosinophils Absolute: 0.1 10*3/uL (ref 0.0–0.5)
Eosinophils Relative: 1 %
HCT: 30.6 % — ABNORMAL LOW (ref 39.0–52.0)
Hemoglobin: 9.6 g/dL — ABNORMAL LOW (ref 13.0–17.0)
Immature Granulocytes: 2 %
Lymphocytes Relative: 10 %
Lymphs Abs: 0.8 10*3/uL (ref 0.7–4.0)
MCH: 30.5 pg (ref 26.0–34.0)
MCHC: 31.4 g/dL (ref 30.0–36.0)
MCV: 97.1 fL (ref 80.0–100.0)
Monocytes Absolute: 0.6 10*3/uL (ref 0.1–1.0)
Monocytes Relative: 7 %
Neutro Abs: 6.7 10*3/uL (ref 1.7–7.7)
Neutrophils Relative %: 80 %
Platelets: 122 10*3/uL — ABNORMAL LOW (ref 150–400)
RBC: 3.15 MIL/uL — ABNORMAL LOW (ref 4.22–5.81)
RDW: 12.7 % (ref 11.5–15.5)
WBC: 8.4 10*3/uL (ref 4.0–10.5)
nRBC: 0 % (ref 0.0–0.2)

## 2021-05-22 LAB — TROPONIN I (HIGH SENSITIVITY): Troponin I (High Sensitivity): 6 ng/L (ref <18)

## 2021-05-22 LAB — PROTIME-INR
INR: 1 (ref 0.8–1.2)
Prothrombin Time: 13.5 seconds (ref 11.4–15.2)

## 2021-05-22 LAB — BRAIN NATRIURETIC PEPTIDE: B Natriuretic Peptide: 118.8 pg/mL — ABNORMAL HIGH (ref 0.0–100.0)

## 2021-05-22 NOTE — ED Provider Notes (Signed)
[facility]PART[facility] Provider Note   CSN: [id number] Arrival date & time: 05/14/2021  2137     History Chief Complaint  Patient presents with   Chest Pain    Ryan Humphrey is a 87 y.o. male who presents via EMS with report of chest pain that started this evening while he was at his facility without any associated shortness of breath or lightheadedness. Onset of pain at 7:45 pm, reportedly similar to his CP in the past at time of MI.   Administered 2 nitro and 324 of ASA prior to arrival.   I personally reviewed the patient's medical records.  He has history of CAD with last heart catheterization in October 2021 at which time there was an LAD stent placed.  Last echocardiogram was in September 2021 and revealed LVEF 55 to 60%.  At time of arrival patient does state he is feeling quite well and does not remember why he was brought to the emergency department.  Appears he has advanced dementia at baseline.  He is not anticoagulated any longer though he is on daily baby aspirin.  HPI     Past Medical History:  Diagnosis Date   Anemia    Arthritis    arthritis,osteopenia,"spinal stenosis"   Bladder neck obstruction 03/28/2011   CAD (coronary artery disease)    Cancer ([place]) 12-06-12   Prostate cancer'98   CKD (chronic kidney disease) stage 3, GFR 30-59 ml/min ([place]) 08/17/2017   Depression    Essential hypertension, benign 04/10/2009   GERD (gastroesophageal reflux disease)    GERD without esophagitis 11/05/2016   H/O hiatal hernia    H/O vertigo 12-06-12   none recent   Hyperlipidemia    IBS (irritable bowel syndrome)    Mixed hyperlipidemia 04/10/2009   Neuropathy    Osteopenia    Peripheral neuropathy 12-06-12   peripheral neuropathy   Rhinitis    RLS (restless legs syndrome)    Vascular dementia ([place]) 09/30/2019   Vitamin B12 deficiency     Patient Active Problem List   Diagnosis Date Noted   Elevated TSH 04/15/2021   Iron deficiency anemia  06/17/2020   Gait abnormality 04/22/2020   Fall 03/18/2020   Depression, recurrent ([place]) 03/18/2020   NSTEMI (non-ST elevated myocardial infarction) ([place]) 03/12/2020   Alzheimer's dementia without behavioral disturbance ([place]) 09/30/2019   CKD (chronic kidney disease) stage 3, GFR 30-59 ml/min ([place]) 08/17/2017   History of TIA (transient ischemic attack) 11/05/2016   Vitamin B12 deficiency 11/05/2016   GERD without esophagitis 11/05/2016   Hereditary and idiopathic peripheral neuropathy 08/20/2015   Benign essential tremor 08/20/2015   Vitamin D deficiency 01/06/2015   Medication management 01/06/2015   Primary osteoarthritis of right knee 11/25/2014   Malignant neoplasm of prostate ([place]) 03/28/2011   INTERMITTENT VERTIGO 10/12/2009   Mixed hyperlipidemia 04/10/2009   Essential hypertension, benign 04/10/2009   CORONARY ATHEROSCLEROSIS NATIVE CORONARY ARTERY 04/10/2009    Past Surgical History:  Procedure Laterality Date   APPENDECTOMY     CARPAL TUNNEL RELEASE     both hands   cataract surgery Left 12-06-12   recent surgery   CHOLECYSTECTOMY     COLONOSCOPY WITH PROPOFOL N/A 12/25/2012   Procedure: COLONOSCOPY WITH PROPOFOL;  Surgeon: Garlan Fair, MD;  Location: [facility];  Service: Endoscopy;  Laterality: N/A;   CORONARY STENT INTERVENTION N/A 03/13/2020   Procedure: CORONARY STENT INTERVENTION;  Surgeon: Nelva Bush, MD;  Location: [facility];  Service: Cardiovascular;  Laterality: N/A;   ESOPHAGOGASTRODUODENOSCOPY (EGD) WITH PROPOFOL N/A 12/25/2012   Procedure: ESOPHAGOGASTRODUODENOSCOPY (EGD) WITH PROPOFOL;  Surgeon: Garlan Fair, MD;  Location: [facility];  Service: Endoscopy;  Laterality: N/A;   INTRAVASCULAR ULTRASOUND/IVUS N/A 03/13/2020   Procedure: Intravascular Ultrasound/IVUS;  Surgeon: Nelva Bush, MD;  Location: [facility];  Service: Cardiovascular;  Laterality: N/A;   LEFT HEART CATH AND CORONARY ANGIOGRAPHY N/A 03/13/2020    Procedure: LEFT HEART CATH AND CORONARY ANGIOGRAPHY;  Surgeon: Nelva Bush, MD;  Location: [facility];  Service: Cardiovascular;  Laterality: N/A;   PROSTATE SURGERY  12-06-12   TONSILLECTOMY     TOTAL KNEE ARTHROPLASTY Right 11/25/2014   Procedure: RIGHT TOTAL KNEE ARTHROPLASTY;  Surgeon: Melrose Nakayama, MD;  Location: [facility];  Service: Orthopedics;  Laterality: Right;       Family History  Problem Relation Age of Onset   CAD Father        Deceased, 12   Dementia Mother        Deceased, 59   Diabetes Brother    Hyperlipidemia Brother    Hypertension Brother    Dementia Brother    Colon cancer Neg Hx    Colon polyps Neg Hx    Kidney disease Neg Hx    Esophageal cancer Neg Hx    Gallbladder disease Neg Hx     Social History   Tobacco Use   Smoking status: Former    Packs/day: 0.50    Years: 30.00    Pack years: 15.00    Types: Cigarettes    Quit date: 06/14/1963    Years since quitting: 57.9   Smokeless tobacco: Never  Vaping Use   Vaping Use: Never used  Substance Use Topics   Alcohol use: Yes    Alcohol/week: 0.0 standard drinks    Comment: Socially   Drug use: No    Home Medications Prior to Admission medications   Medication Sig Start Date End Date Taking? Authorizing Provider  acetaminophen (TYLENOL) 325 MG tablet Take 650 mg by mouth every 4 (four) hours as needed.    [provider]  aspirin EC 81 MG tablet Take 81 mg by mouth daily. Swallow whole.    [provider]  atorvastatin (LIPITOR) 80 MG tablet Take 1 tablet (80 mg total) by mouth daily. 03/17/20   Leanor Kail, PA  Cholecalciferol (VITAMIN D) 50 MCG (2000 UT) tablet Take 1 pill daily. 01/10/19   Unk Pinto, MD  gabapentin (NEURONTIN) 100 MG capsule Take 200 mg by mouth 2 (two) times daily.    [provider]  Iron, Ferrous Sulfate, 325 (65 Fe) MG TABS Take 1 tablet by mouth. Mon and Fri    [provider]  isosorbide mononitrate (IMDUR) 30 MG  24 hr tablet Take 30 mg by mouth daily. Take 1/2 tab =15 mg, oral, Once A Day, **HOLD IF SBP IS LESS THAN 100**    [provider]  lactose free nutrition (BOOST) LIQD Take 237 mLs by mouth daily.    [provider]  nitroGLYCERIN (NITROSTAT) 0.4 MG SL tablet Place 0.4 mg under the tongue every 5 (five) minutes as needed for chest pain.    [provider]  pantoprazole (PROTONIX) 40 MG tablet Take 40 mg by mouth 2 (two) times daily.    [provider]  sertraline (ZOLOFT) 100 MG tablet 1 tablet Daily for Mood 09/10/19 03/30/30  Unk Pinto, MD  sertraline (ZOLOFT) 25 MG tablet Take 25 mg by  mouth daily. Take with 100 mg tab to total 125 mg daily.    [provider]    Allergies    Niacin, Penicillins, and Tramadol  Review of Systems   Review of Systems  Unable to perform ROS: Dementia   Physical Exam Updated Vital Signs BP 137/61   Pulse 70   Temp 98.6 F (37 C) (Oral)   Resp 10   SpO2 99%   Physical Exam Vitals and nursing note reviewed.  Constitutional:      Appearance: He is not ill-appearing or toxic-appearing.  HENT:     Head: Normocephalic and atraumatic.     Nose: Nose normal.     Mouth/Throat:     Mouth: Mucous membranes are moist.     Pharynx: Oropharynx is clear. Uvula midline. No oropharyngeal exudate or posterior oropharyngeal erythema.  Eyes:     General: Lids are normal. Vision grossly intact.        Right eye: No discharge.        Left eye: No discharge.     Extraocular Movements: Extraocular movements intact.     Conjunctiva/sclera: Conjunctivae normal.     Pupils: Pupils are equal, round, and reactive to light.  Neck:     Trachea: Trachea and phonation normal.  Cardiovascular:     Rate and Rhythm: Normal rate and regular rhythm.     Pulses: Normal pulses.     Heart sounds: Normal heart sounds. No murmur heard. Pulmonary:     Effort: Pulmonary effort is normal. No tachypnea, bradypnea, accessory muscle  usage, prolonged expiration or respiratory distress.     Breath sounds: Normal breath sounds. No wheezing or rales.  Chest:     Chest wall: No mass, deformity, swelling, tenderness, crepitus or edema.     Comments: No area of tenderness to palpation, deformity, bruising, or step-off Abdominal:     General: Bowel sounds are normal. There is no distension.     Palpations: Abdomen is soft.     Tenderness: There is no abdominal tenderness. There is no right CVA tenderness, left CVA tenderness, guarding or rebound.  Musculoskeletal:        General: No deformity.     Cervical back: Full passive range of motion without pain and neck supple. No edema, rigidity or crepitus. No pain with movement, spinous process tenderness or muscular tenderness.     Right lower leg: No tenderness. No edema.     Left lower leg: No tenderness. No edema.  Lymphadenopathy:     Cervical: No cervical adenopathy.  Skin:    General: Skin is warm and dry.     Capillary Refill: Capillary refill takes less than 2 seconds.  Neurological:     General: No focal deficit present.     Mental Status: He is alert. Mental status is at baseline.     GCS: GCS eye subscore is 4. GCS verbal subscore is 5. GCS motor subscore is 6.     Sensory: Sensation is intact.     Motor: Motor function is intact.  Psychiatric:        Mood and Affect: Mood normal.    ED Results / Procedures / Treatments   Labs (all labs ordered are listed, but only abnormal results are displayed) Labs Reviewed  COMPREHENSIVE METABOLIC PANEL - Abnormal; Notable for the following components:      Result Value   Glucose, Bld 109 (*)    BUN 30 (*)    Creatinine, Ser 1.52 (*)  Calcium 8.6 (*)    Total Protein 5.7 (*)    Albumin 3.4 (*)    GFR, Estimated 43 (*)    All other components within normal limits  BRAIN NATRIURETIC PEPTIDE - Abnormal; Notable for the following components:   B Natriuretic Peptide 118.8 (*)    All other components within normal  limits  CBC WITH DIFFERENTIAL/PLATELET - Abnormal; Notable for the following components:   RBC 3.15 (*)    Hemoglobin 9.6 (*)    HCT 30.6 (*)    Platelets 122 (*)    Abs Immature Granulocytes 0.14 (*)    All other components within normal limits  PROTIME-INR  TROPONIN I (HIGH SENSITIVITY)  TROPONIN I (HIGH SENSITIVITY)    EKG None  Radiology CT Head Wo Contrast  Result Date: 05/23/2021 CLINICAL DATA:  Trauma. EXAM: CT HEAD WITHOUT CONTRAST CT CERVICAL SPINE WITHOUT CONTRAST TECHNIQUE: Multidetector CT imaging of the head and cervical spine was performed following the standard protocol without intravenous contrast. Multiplanar CT image reconstructions of the cervical spine were also generated. COMPARISON:  CT dated 09/05/2018. FINDINGS: CT HEAD FINDINGS Brain: Moderate age-related atrophy and chronic microvascular ischemic changes. There is no acute intracranial hemorrhage. No mass effect or midline shift. No extra-axial fluid collection. Vascular: No hyperdense vessel or unexpected calcification. Skull: Normal. Negative for fracture or focal lesion. Sinuses/Orbits: No acute finding. Other: None CT CERVICAL SPINE FINDINGS Alignment: No acute subluxation. Skull base and vertebrae: No acute fracture. Soft tissues and spinal canal: No prevertebral fluid or swelling. No visible canal hematoma. Disc levels:  Multilevel degenerative changes. Upper chest: Mildly displaced fracture of the right third and fourth ribs. Partially visualized small right pleural effusion. Other: Bilateral carotid bulb calcified plaques. IMPRESSION: 1. No acute intracranial pathology. Moderate age-related atrophy and chronic microvascular ischemic changes. 2. No acute/traumatic cervical spine pathology. 3. Mildly displaced fracture of the right third and fourth ribs. Partially visualized small right pleural effusion. Electronically Signed   By: Anner Crete M.D.   On: 05/23/2021 00:39   CT Cervical Spine Wo  Contrast  Result Date: 05/23/2021 CLINICAL DATA:  Trauma. EXAM: CT HEAD WITHOUT CONTRAST CT CERVICAL SPINE WITHOUT CONTRAST TECHNIQUE: Multidetector CT imaging of the head and cervical spine was performed following the standard protocol without intravenous contrast. Multiplanar CT image reconstructions of the cervical spine were also generated. COMPARISON:  CT dated 09/05/2018. FINDINGS: CT HEAD FINDINGS Brain: Moderate age-related atrophy and chronic microvascular ischemic changes. There is no acute intracranial hemorrhage. No mass effect or midline shift. No extra-axial fluid collection. Vascular: No hyperdense vessel or unexpected calcification. Skull: Normal. Negative for fracture or focal lesion. Sinuses/Orbits: No acute finding. Other: None CT CERVICAL SPINE FINDINGS Alignment: No acute subluxation. Skull base and vertebrae: No acute fracture. Soft tissues and spinal canal: No prevertebral fluid or swelling. No visible canal hematoma. Disc levels:  Multilevel degenerative changes. Upper chest: Mildly displaced fracture of the right third and fourth ribs. Partially visualized small right pleural effusion. Other: Bilateral carotid bulb calcified plaques. IMPRESSION: 1. No acute intracranial pathology. Moderate age-related atrophy and chronic microvascular ischemic changes. 2. No acute/traumatic cervical spine pathology. 3. Mildly displaced fracture of the right third and fourth ribs. Partially visualized small right pleural effusion. Electronically Signed   By: Anner Crete M.D.   On: 05/23/2021 00:39   DG Chest Port 1 View  Result Date: 05/27/2021 CLINICAL DATA:  Chest pain, history of coronary artery disease EXAM: PORTABLE CHEST 1 VIEW COMPARISON:  02/13/2021 FINDINGS: Single frontal  view of the chest demonstrates an unremarkable cardiac silhouette. No airspace disease, effusion, or pneumothorax. There is an acute displaced right posterior seventh rib fracture. No other acute bony abnormalities.  IMPRESSION: 1. Acute displaced right posterior seventh rib fracture. 2. Otherwise no acute intrathoracic process. Electronically Signed   By: Randa Ngo M.D.   On: 05/21/2021 22:05    Procedures Procedures   Medications Ordered in ED Medications - No data to display  ED Course  I have reviewed the triage vital signs and the nursing notes.  Pertinent labs & imaging results that were available during my care of the patient were reviewed by me and considered in my medical decision making (see chart for details).    MDM Rules/Calculators/A&P                         87 year old male who presents with concern for chest pain reported by EMS.  Differential diagnosis includes but is limited to acute trauma, ACS, PE, pleural effusion, pneumothorax, pneumonia.  Vital signs are normal and intake.  Cardiopulmonary exam is normal, abdominal exam is benign.  Patient without evidence of trauma on the head, neck, chest, abdomen, or extremities.  CBC with anemia with hemoglobin 9.6 at patient's baseline.  CMP with  creatinine of 1.5 at patient's baseline.  BNP mildly elevated to 118 the patient does not appear clinically fluid overloaded.  INR is normal and troponin is negative, 6.  EKG with sinus rhythm with right bundle blanch block without acute ischemic changes.  Chest x-ray is negative for acute cardiopulmonary disease the patient does have an acute displaced right posterior seventh rib fracture.  Due to evidence of trauma on chest x-ray in context of patient unable to provide any history, CT of the head and C-spine was obtained.  CT head was negative for acute intracranial abnormality.  No acute traumatic injury to the C-spine though there was a mildly displaced fracture of the right third and fourth ribs visualized on CT of the C-spine.  Case discussed with attending physician.  Patient is very well-appearing, not tender over the anterior posterior chest walls, with normal pulmonary exam and  sufficient pulmonary effort with full inspiration.  Will provide incentive spirometry, however no indication for admission to the hospital at this time.  Given reassuring vital signs, physical exam, and work-up, patient is safe to be discharged home.  Attempted to contact the patient's facility multiple times and each time the phone was picked up and then hung up on me once transferred to an RN.  Ryan Humphrey unable to voice understanding of his medical evaluation and treatment plan though he continues to endorse that he is asymptomatic and feels well at this time.  No further work-up warranted in the ER.  This chart was dictated using voice recognition software, Dragon. Despite the best efforts of this provider to proofread and correct errors, errors may still occur which can change documentation meaning.  Final Clinical Impression(s) / ED Diagnoses Final diagnoses:  Chest pain, unspecified type    Rx / DC Orders ED Discharge Orders     None        Emeline Darling, PA-C 05/23/21 0103    Jeanell Sparrow, DO 05/25/21 0732

## 2021-05-22 NOTE — Telephone Encounter (Signed)
Nurse from [facility] called to report this resident is having chest pain midsternal and then radiating to the right side. Has prior stent with DAPT therapy. He had nitro x 2 without relief. He is a full code per nurse. Order given to send to the ER for further eval. Oxygen applied as well.

## 2021-05-22 NOTE — Progress Notes (Signed)
This encounter was created in error - please disregard.

## 2021-05-22 NOTE — ED Triage Notes (Addendum)
Pt from [facility] facility. Pain started at 7:45 pm tonight. Pt felt a hard CP. No sob or dizziness. Pt stated the last time he felt like this was when he had a non-stemi several years ago. Pain 5/10 .   Meds given by facility:  2 nitro. Some improvements after nitro.  324 aspirin given by EMS.   BP 136/66 HR 70  RR 16  SPO2 100%. RA

## 2021-05-23 DIAGNOSIS — S199XXA Unspecified injury of neck, initial encounter: Secondary | ICD-10-CM | POA: Diagnosis not present

## 2021-05-23 DIAGNOSIS — S0990XA Unspecified injury of head, initial encounter: Secondary | ICD-10-CM | POA: Diagnosis not present

## 2021-05-23 DIAGNOSIS — S2241XA Multiple fractures of ribs, right side, initial encounter for closed fracture: Secondary | ICD-10-CM | POA: Diagnosis not present

## 2021-05-23 DIAGNOSIS — R079 Chest pain, unspecified: Secondary | ICD-10-CM | POA: Diagnosis not present

## 2021-05-23 DIAGNOSIS — J90 Pleural effusion, not elsewhere classified: Secondary | ICD-10-CM | POA: Diagnosis not present

## 2021-05-23 NOTE — ED Notes (Signed)
RN reviewed discharge instructions with pt and daughter. Both verbalized understanding and had no further questions. VSS upon discharge.

## 2021-05-23 NOTE — Discharge Instructions (Signed)
Ryan Humphrey was seen in the ER today for his reported chest pain.  His cardiac work-up was normal but he was found to have 3 rib fractures.  He is not in pain and his pulmonary exam is normal therefore he is safe to be discharged back to facility.  He has been provided an incentive spirometer.  He should use this multiple times a day for the next couple of weeks to ensure that he does not develop any pneumonia due to decreased respiratory effort.  Please have him evaluated by a medical provider frequently and return to the ER if he develops any new severe symptoms.

## 2021-05-24 ENCOUNTER — Non-Acute Institutional Stay: Payer: Medicare Other | Admitting: Nurse Practitioner

## 2021-05-24 ENCOUNTER — Encounter: Payer: Self-pay | Admitting: Nurse Practitioner

## 2021-05-24 DIAGNOSIS — N1832 Chronic kidney disease, stage 3b: Secondary | ICD-10-CM

## 2021-05-24 DIAGNOSIS — I2510 Atherosclerotic heart disease of native coronary artery without angina pectoris: Secondary | ICD-10-CM | POA: Diagnosis not present

## 2021-05-24 DIAGNOSIS — Z9181 History of falling: Secondary | ICD-10-CM | POA: Diagnosis not present

## 2021-05-24 DIAGNOSIS — F339 Major depressive disorder, recurrent, unspecified: Secondary | ICD-10-CM

## 2021-05-24 DIAGNOSIS — S2241XD Multiple fractures of ribs, right side, subsequent encounter for fracture with routine healing: Secondary | ICD-10-CM

## 2021-05-24 DIAGNOSIS — I10 Essential (primary) hypertension: Secondary | ICD-10-CM

## 2021-05-24 DIAGNOSIS — F0280 Dementia in other diseases classified elsewhere without behavioral disturbance: Secondary | ICD-10-CM | POA: Diagnosis not present

## 2021-05-24 DIAGNOSIS — G609 Hereditary and idiopathic neuropathy, unspecified: Secondary | ICD-10-CM | POA: Diagnosis not present

## 2021-05-24 DIAGNOSIS — R2689 Other abnormalities of gait and mobility: Secondary | ICD-10-CM | POA: Diagnosis not present

## 2021-05-24 DIAGNOSIS — G309 Alzheimer's disease, unspecified: Secondary | ICD-10-CM | POA: Diagnosis not present

## 2021-05-24 DIAGNOSIS — R269 Unspecified abnormalities of gait and mobility: Secondary | ICD-10-CM | POA: Diagnosis not present

## 2021-05-24 DIAGNOSIS — D500 Iron deficiency anemia secondary to blood loss (chronic): Secondary | ICD-10-CM | POA: Diagnosis not present

## 2021-05-24 DIAGNOSIS — K219 Gastro-esophageal reflux disease without esophagitis: Secondary | ICD-10-CM

## 2021-05-24 DIAGNOSIS — R296 Repeated falls: Secondary | ICD-10-CM | POA: Diagnosis not present

## 2021-05-24 DIAGNOSIS — M6281 Muscle weakness (generalized): Secondary | ICD-10-CM | POA: Diagnosis not present

## 2021-05-24 DIAGNOSIS — S2249XA Multiple fractures of ribs, unspecified side, initial encounter for closed fracture: Secondary | ICD-10-CM | POA: Insufficient documentation

## 2021-05-24 NOTE — Assessment & Plan Note (Signed)
NSTEMI stent f/u cardiology. Hospital stay 03/11/20-03/16/20 for NSTEMI, troponin 300s at ED, Cath showed LAD stenosis, underwent drug eluting stent, EF 55-60%, takes Atorvastatin, ASA,, Added Isosorbide 15mg  since last chest pain 05/01/20. 02/13/21 ED for chest pain, f/u Cardiology 04/06/21, took him off Plavix.

## 2021-05-24 NOTE — Assessment & Plan Note (Signed)
The right 3rd, 4th, 7th, will extend Tylenol 1000mg  bid x 7 days.

## 2021-05-24 NOTE — Progress Notes (Signed)
Location:   [facility] Room Number: 678L Place of Service:  ALF (13) Provider: Lennie Odor Jilliane Kazanjian NP  Virgie Dad, MD  Patient Care Team: Virgie Dad, MD as PCP - General (Internal Medicine) Elouise Munroe, MD as PCP - Cardiology (Cardiology)  Extended Emergency Contact Information Primary Emergency Contact: Weeks,[name] Address: [address]          [place], [place] [address] [name] of [place] Phone: [phone] Mobile Phone: [phone] Relation: Daughter Secondary Emergency Contact: [name], [place] of [place] Mobile Phone: [phone] Relation: Daughter  Code Status: DNR Goals of care: Advanced Directive information Advanced Directives 05/24/2021  Does Patient Have a Medical Advance Directive? Yes  Type of Paramedic of [place];Living will  Does patient want to make changes to medical advance directive? No - Patient declined  Copy of [place] in Chart? Yes - validated most recent copy scanned in chart (See row information)  Would patient like information on creating a medical advance directive? -     Chief Complaint  Patient presents with   Acute Visit    Emergency Department visit follow up    HPI:  Pt is a 87 y.o. male seen today for an acute visit for f/u ED evaluation 06/02/2021 for chest pain. CMP, BNP, CBC.diff, Troponin I, EKG, CXR, showed acutet displaced R posterior 7th rib fx CT head/cervical spine showed milly displaced fx of the R 3rd, 4th fx.   R sided chest wall pain 05/17/21, bruises noted, CXR, completed at [facility], Tylenol 100mg  bid, needs to be continued.   Anemia, takes Iron, Hgb 9.6 05/28/2021             CAD, NSTEMI stent f/u cardiology. Hospital stay 03/11/20-03/16/20 for NSTEMI, troponin 300s at ED, Cath showed LAD stenosis, underwent drug eluting stent, EF 55-60%, takes Atorvastatin, ASA,, Added Isosorbide 15mg  since last chest pain 05/01/20.  02/13/21 ED for chest pain, f/u Cardiology 04/06/21, took him off Plavix.              HTN, controlled, off Metoprolol.             GERD, takes Pantoprazole             AI, EF 55-60%             CKD stage 3, Bun/creat 30/1.52 05/15/2021 Alzheimer's dementia, didn't tolerate memory meds, underwent neurology eval in the past.             Peripheral neuropathy, takes Gabapentin             Depression takes Sertraline             Gait abnormality, multiple factorials, working with therapy, risk of falling is high.     Past Medical History:  Diagnosis Date   Anemia    Arthritis    arthritis,osteopenia,"spinal stenosis"   Bladder neck obstruction 03/28/2011   CAD (coronary artery disease)    Cancer ([place]) 12-06-12   Prostate cancer'98   CKD (chronic kidney disease) stage 3, GFR 30-59 ml/min ([place]) 08/17/2017   Depression    Essential hypertension, benign 04/10/2009   GERD (gastroesophageal reflux disease)    GERD without esophagitis 11/05/2016   H/O hiatal hernia    H/O vertigo 12-06-12   none recent   Hyperlipidemia    IBS (irritable bowel syndrome)    Mixed hyperlipidemia 04/10/2009   Neuropathy  Osteopenia    Peripheral neuropathy 12-06-12   peripheral neuropathy   Rhinitis    RLS (restless legs syndrome)    Vascular dementia ([facility]) 09/30/2019   Vitamin B12 deficiency    Past Surgical History:  Procedure Laterality Date   APPENDECTOMY     CARPAL TUNNEL RELEASE     both hands   cataract surgery Left 12-06-12   recent surgery   CHOLECYSTECTOMY     COLONOSCOPY WITH PROPOFOL N/A 12/25/2012   Procedure: COLONOSCOPY WITH PROPOFOL;  Surgeon: Garlan Fair, MD;  Location: [facility];  Service: Endoscopy;  Laterality: N/A;   CORONARY STENT INTERVENTION N/A 03/13/2020   Procedure: CORONARY STENT INTERVENTION;  Surgeon: Nelva Bush, MD;  Location: [facility];  Service: Cardiovascular;  Laterality: N/A;   ESOPHAGOGASTRODUODENOSCOPY (EGD) WITH PROPOFOL N/A 12/25/2012    Procedure: ESOPHAGOGASTRODUODENOSCOPY (EGD) WITH PROPOFOL;  Surgeon: Garlan Fair, MD;  Location: [facility];  Service: Endoscopy;  Laterality: N/A;   INTRAVASCULAR ULTRASOUND/IVUS N/A 03/13/2020   Procedure: Intravascular Ultrasound/IVUS;  Surgeon: Nelva Bush, MD;  Location: [facility];  Service: Cardiovascular;  Laterality: N/A;   LEFT HEART CATH AND CORONARY ANGIOGRAPHY N/A 03/13/2020   Procedure: LEFT HEART CATH AND CORONARY ANGIOGRAPHY;  Surgeon: Nelva Bush, MD;  Location: [facility];  Service: Cardiovascular;  Laterality: N/A;   PROSTATE SURGERY  12-06-12   TONSILLECTOMY     TOTAL KNEE ARTHROPLASTY Right 11/25/2014   Procedure: RIGHT TOTAL KNEE ARTHROPLASTY;  Surgeon: Melrose Nakayama, MD;  Location: [facility];  Service: Orthopedics;  Laterality: Right;    Allergies  Allergen Reactions   Niacin Itching   Penicillins Other (See Comments)    "rash"    Tramadol Itching    Allergies as of 05/24/2021       Reactions   Niacin Itching   Penicillins Other (See Comments)   "rash"   Tramadol Itching        Medication List        Accurate as of May 24, 2021 11:59 PM. If you have any questions, ask your nurse or doctor.          acetaminophen 500 MG tablet Commonly known as: TYLENOL Take 1,000 mg by mouth in the morning and at bedtime.   aspirin EC 81 MG tablet Take 81 mg by mouth daily. Swallow whole.   atorvastatin 80 MG tablet Commonly known as: LIPITOR Take 1 tablet (80 mg total) by mouth daily.   gabapentin 100 MG capsule Commonly known as: NEURONTIN Take 200 mg by mouth 2 (two) times daily.   Iron (Ferrous Sulfate) 325 (65 Fe) MG Tabs Take 1 tablet by mouth. **PLEASE GIVE THIS MED WITH FOOD** Mon, Wed, and Fri   isosorbide mononitrate 30 MG 24 hr tablet Commonly known as: IMDUR Take 30 mg by mouth daily. Take 1/2 tab =15 mg, oral, Once A Day, **HOLD IF SBP IS LESS THAN 100**   lactose free nutrition Liqd Take 237 mLs by  mouth daily.   nitroGLYCERIN 0.4 MG SL tablet Commonly known as: NITROSTAT Place 0.4 mg under the tongue every 5 (five) minutes as needed for chest pain. X 3   pantoprazole 40 MG tablet Commonly known as: PROTONIX Take 40 mg by mouth 2 (two) times daily.   sertraline 25 MG tablet Commonly known as: ZOLOFT Take 25 mg by mouth daily. Take with 100 mg tab to total 125 mg daily.   sertraline 100 MG tablet Commonly known as: ZOLOFT 1 tablet Daily  for Mood   Vitamin D 50 MCG (2000 UT) tablet Take 1 pill daily.        Review of Systems  Unable to perform ROS: Dementia   Immunization History  Administered Date(s) Administered   Influenza, High Dose Seasonal PF 03/05/2015, 03/05/2018   Influenza-Unspecified 04/22/2016, 03/13/2017, 06/13/2018, 03/28/2019, 04/01/2021   Moderna Sars-Covid-2 Vaccination 06/17/2019, 07/15/2019, 04/21/2020   Pneumococcal Conjugate-13 05/23/2017   Pneumococcal Polysaccharide-23 01/20/2016   Unspecified SARS-COV-2 Vaccination 11/10/2020, 03/02/2021   Pertinent  Health Maintenance Due  Topic Date Due   INFLUENZA VACCINE  Completed   Fall Risk 03/16/2020 03/16/2020 05/01/2020 05/02/2020 02/13/2021  Falls in the past year? - - - - -  Was there an injury with Fall? - - - - -  Fall Risk Category Calculator - - - - -  Fall Risk Category - - - - -  Patient Fall Risk Level Moderate fall risk Moderate fall risk High fall risk Moderate fall risk Low fall risk  Patient at Risk for Falls Due to - - - - -  Fall risk Follow up - - - - -   Functional Status Survey:    Vitals:   05/24/21 1512  BP: 120/60  Pulse: 70  Resp: 18  Temp: (!) 97.2 F (36.2 C)  SpO2: 97%  Weight: 148 lb 9.6 oz (67.4 kg)  Height: 6' (1.829 m)   Body mass index is 20.15 kg/m. Physical Exam Vitals and nursing note reviewed.  Constitutional:      Appearance: Normal appearance.  HENT:     Head: Normocephalic and atraumatic.     Mouth/Throat:     Mouth: Mucous membranes are  moist.  Eyes:     Extraocular Movements: Extraocular movements intact.     Conjunctiva/sclera: Conjunctivae normal.     Pupils: Pupils are equal, round, and reactive to light.  Cardiovascular:     Rate and Rhythm: Normal rate and regular rhythm.     Heart sounds: No murmur heard. Pulmonary:     Effort: Pulmonary effort is normal.     Breath sounds: No wheezing.  Abdominal:     General: Bowel sounds are normal.     Palpations: Abdomen is soft.     Tenderness: There is no abdominal tenderness.  Musculoskeletal:     Cervical back: Normal range of motion and neck supple.     Right lower leg: No edema.     Left lower leg: No edema.     Comments: Minimal edema BLE. R sided chest wall tender on palpation, c/o pain with deep breathing.   Skin:    General: Skin is warm and dry.     Findings: Bruising present.     Comments: Bruise noted right side rib cage, pain palpated.   Neurological:     General: No focal deficit present.     Mental Status: He is alert. Mental status is at baseline.     Motor: No weakness.     Gait: Gait abnormal.     Comments: Oriented to person, place  Psychiatric:        Mood and Affect: Mood normal.    Labs reviewed: Recent Labs    02/13/21 0509 04/13/21 0000 05/25/2021 2140  NA 140 144 140  K 4.6 4.3 4.4  CL 107 106 106  CO2 28 26* 25  GLUCOSE 100*  --  109*  BUN 30* 30* 30*  CREATININE 1.59* 1.6* 1.52*  CALCIUM 9.0 9.6 8.6*   Recent Labs  12/23/20 0000 04/13/21 0000 05/18/2021 2140  AST 13* 25 24  ALT 11 25 24   ALKPHOS 69 92 76  BILITOT  --   --  0.6  PROT  --   --  5.7*  ALBUMIN 3.8 4.6 3.4*   Recent Labs    12/23/20 0000 02/13/21 0509 04/13/21 0000 05/25/2021 2140  WBC 6.9 7.8 8.6 8.4  NEUTROABS 5,396.00 6.2  --  6.7  HGB 8.9* 9.0* 11.3* 9.6*  HCT 28* 28.6* 35* 30.6*  MCV  --  97.3  --  97.1  PLT 100* 133* 151 122*   Lab Results  Component Value Date   TSH 3.715 03/12/2020   Lab Results  Component Value Date   HGBA1C 5.1  09/10/2019   Lab Results  Component Value Date   CHOL 114 04/13/2021   HDL 37 04/13/2021   LDLCALC 59 04/13/2021   TRIG 101 04/13/2021   CHOLHDL 4.2 02/26/2020    Significant Diagnostic Results in last 30 days:  CT Head Wo Contrast  Result Date: 05/23/2021 CLINICAL DATA:  Trauma. EXAM: CT HEAD WITHOUT CONTRAST CT CERVICAL SPINE WITHOUT CONTRAST TECHNIQUE: Multidetector CT imaging of the head and cervical spine was performed following the standard protocol without intravenous contrast. Multiplanar CT image reconstructions of the cervical spine were also generated. COMPARISON:  CT dated 09/05/2018. FINDINGS: CT HEAD FINDINGS Brain: Moderate age-related atrophy and chronic microvascular ischemic changes. There is no acute intracranial hemorrhage. No mass effect or midline shift. No extra-axial fluid collection. Vascular: No hyperdense vessel or unexpected calcification. Skull: Normal. Negative for fracture or focal lesion. Sinuses/Orbits: No acute finding. Other: None CT CERVICAL SPINE FINDINGS Alignment: No acute subluxation. Skull base and vertebrae: No acute fracture. Soft tissues and spinal canal: No prevertebral fluid or swelling. No visible canal hematoma. Disc levels:  Multilevel degenerative changes. Upper chest: Mildly displaced fracture of the right third and fourth ribs. Partially visualized small right pleural effusion. Other: Bilateral carotid bulb calcified plaques. IMPRESSION: 1. No acute intracranial pathology. Moderate age-related atrophy and chronic microvascular ischemic changes. 2. No acute/traumatic cervical spine pathology. 3. Mildly displaced fracture of the right third and fourth ribs. Partially visualized small right pleural effusion. Electronically Signed   By: Anner Crete M.D.   On: 05/23/2021 00:39   CT Cervical Spine Wo Contrast  Result Date: 05/23/2021 CLINICAL DATA:  Trauma. EXAM: CT HEAD WITHOUT CONTRAST CT CERVICAL SPINE WITHOUT CONTRAST TECHNIQUE: Multidetector  CT imaging of the head and cervical spine was performed following the standard protocol without intravenous contrast. Multiplanar CT image reconstructions of the cervical spine were also generated. COMPARISON:  CT dated 09/05/2018. FINDINGS: CT HEAD FINDINGS Brain: Moderate age-related atrophy and chronic microvascular ischemic changes. There is no acute intracranial hemorrhage. No mass effect or midline shift. No extra-axial fluid collection. Vascular: No hyperdense vessel or unexpected calcification. Skull: Normal. Negative for fracture or focal lesion. Sinuses/Orbits: No acute finding. Other: None CT CERVICAL SPINE FINDINGS Alignment: No acute subluxation. Skull base and vertebrae: No acute fracture. Soft tissues and spinal canal: No prevertebral fluid or swelling. No visible canal hematoma. Disc levels:  Multilevel degenerative changes. Upper chest: Mildly displaced fracture of the right third and fourth ribs. Partially visualized small right pleural effusion. Other: Bilateral carotid bulb calcified plaques. IMPRESSION: 1. No acute intracranial pathology. Moderate age-related atrophy and chronic microvascular ischemic changes. 2. No acute/traumatic cervical spine pathology. 3. Mildly displaced fracture of the right third and fourth ribs. Partially visualized small right pleural effusion. Electronically Signed  By: Anner Crete M.D.   On: 05/23/2021 00:39   DG Chest Port 1 View  Result Date: 06/04/2021 CLINICAL DATA:  Chest pain, history of coronary artery disease EXAM: PORTABLE CHEST 1 VIEW COMPARISON:  02/13/2021 FINDINGS: Single frontal view of the chest demonstrates an unremarkable cardiac silhouette. No airspace disease, effusion, or pneumothorax. There is an acute displaced right posterior seventh rib fracture. No other acute bony abnormalities. IMPRESSION: 1. Acute displaced right posterior seventh rib fracture. 2. Otherwise no acute intrathoracic process. Electronically Signed   By: Randa Ngo M.D.   On: 05/27/2021 22:05    Assessment/Plan: Multiple rib fractures The right 3rd, 4th, 7th, will extend Tylenol 1000mg  bid x 7 days.   Iron deficiency anemia takes Iron, Hgb 9.6 06/05/2021  CORONARY ATHEROSCLEROSIS NATIVE CORONARY ARTERY NSTEMI stent f/u cardiology. Hospital stay 03/11/20-03/16/20 for NSTEMI, troponin 300s at ED, Cath showed LAD stenosis, underwent drug eluting stent, EF 55-60%, takes Atorvastatin, ASA,, Added Isosorbide 15mg  since last chest pain 05/01/20. 02/13/21 ED for chest pain, f/u Cardiology 04/06/21, took him off Plavix.   Essential hypertension, benign Blood pressure is controlled, off Metoprolol.   GERD without esophagitis Stable, continue Pantoprazole  CKD (chronic kidney disease) stage 3, GFR 30-59 ml/min ([facility]) Bun/creat 30/1.52 05/25/2021  Alzheimer's dementia without behavioral disturbance ([facility])  didn't tolerate memory meds, underwent neurology eval in the past.  Hereditary and idiopathic peripheral neuropathy takes Gabapentin  Depression, recurrent ([place]) His mood is stable,  takes Sertraline  Gait abnormality  multiple factorials, working with therapy, risk of falling is high.     Family/ staff Communication: plan of care reviewed with the patient and charge nurse.   Labs/tests ordered:  none  Time spend 40 minutes.

## 2021-05-24 NOTE — Assessment & Plan Note (Signed)
takes Iron, Hgb 9.6 05/23/2021

## 2021-05-24 NOTE — Assessment & Plan Note (Signed)
didn't tolerate memory meds, underwent neurology eval in the past.

## 2021-05-24 NOTE — Assessment & Plan Note (Signed)
Bun/creat 30/1.52 05/25/2021

## 2021-05-24 NOTE — Assessment & Plan Note (Signed)
takes Gabapentin  

## 2021-05-24 NOTE — Assessment & Plan Note (Signed)
Blood pressure is controlled, off Metoprolol. 

## 2021-05-24 NOTE — Assessment & Plan Note (Signed)
His mood is stable,  takes Sertraline

## 2021-05-24 NOTE — Assessment & Plan Note (Signed)
Stable, continue Pantoprazole.  

## 2021-05-24 NOTE — Assessment & Plan Note (Signed)
multiple factorials, working with therapy, risk of falling is high.

## 2021-05-25 ENCOUNTER — Encounter: Payer: Self-pay | Admitting: Nurse Practitioner

## 2021-05-25 DIAGNOSIS — M6281 Muscle weakness (generalized): Secondary | ICD-10-CM | POA: Diagnosis not present

## 2021-05-25 DIAGNOSIS — R2689 Other abnormalities of gait and mobility: Secondary | ICD-10-CM | POA: Diagnosis not present

## 2021-05-25 DIAGNOSIS — R296 Repeated falls: Secondary | ICD-10-CM | POA: Diagnosis not present

## 2021-05-25 DIAGNOSIS — Z9181 History of falling: Secondary | ICD-10-CM | POA: Diagnosis not present

## 2021-05-27 DIAGNOSIS — R296 Repeated falls: Secondary | ICD-10-CM | POA: Diagnosis not present

## 2021-05-27 DIAGNOSIS — M6281 Muscle weakness (generalized): Secondary | ICD-10-CM | POA: Diagnosis not present

## 2021-05-27 DIAGNOSIS — R2689 Other abnormalities of gait and mobility: Secondary | ICD-10-CM | POA: Diagnosis not present

## 2021-05-27 DIAGNOSIS — Z9181 History of falling: Secondary | ICD-10-CM | POA: Diagnosis not present

## 2021-05-28 DIAGNOSIS — R2689 Other abnormalities of gait and mobility: Secondary | ICD-10-CM | POA: Diagnosis not present

## 2021-05-28 DIAGNOSIS — M6281 Muscle weakness (generalized): Secondary | ICD-10-CM | POA: Diagnosis not present

## 2021-05-28 DIAGNOSIS — Z9181 History of falling: Secondary | ICD-10-CM | POA: Diagnosis not present

## 2021-05-28 DIAGNOSIS — R296 Repeated falls: Secondary | ICD-10-CM | POA: Diagnosis not present

## 2021-05-31 DIAGNOSIS — R2689 Other abnormalities of gait and mobility: Secondary | ICD-10-CM | POA: Diagnosis not present

## 2021-05-31 DIAGNOSIS — Z9181 History of falling: Secondary | ICD-10-CM | POA: Diagnosis not present

## 2021-05-31 DIAGNOSIS — R296 Repeated falls: Secondary | ICD-10-CM | POA: Diagnosis not present

## 2021-05-31 DIAGNOSIS — M6281 Muscle weakness (generalized): Secondary | ICD-10-CM | POA: Diagnosis not present

## 2021-06-01 ENCOUNTER — Encounter: Payer: Self-pay | Admitting: Internal Medicine

## 2021-06-01 ENCOUNTER — Non-Acute Institutional Stay: Payer: Medicare Other | Admitting: Internal Medicine

## 2021-06-01 DIAGNOSIS — R296 Repeated falls: Secondary | ICD-10-CM | POA: Diagnosis not present

## 2021-06-01 DIAGNOSIS — N1832 Chronic kidney disease, stage 3b: Secondary | ICD-10-CM | POA: Diagnosis not present

## 2021-06-01 DIAGNOSIS — D500 Iron deficiency anemia secondary to blood loss (chronic): Secondary | ICD-10-CM

## 2021-06-01 DIAGNOSIS — S2241XD Multiple fractures of ribs, right side, subsequent encounter for fracture with routine healing: Secondary | ICD-10-CM | POA: Diagnosis not present

## 2021-06-01 DIAGNOSIS — Z9181 History of falling: Secondary | ICD-10-CM | POA: Diagnosis not present

## 2021-06-01 DIAGNOSIS — R269 Unspecified abnormalities of gait and mobility: Secondary | ICD-10-CM

## 2021-06-01 DIAGNOSIS — F0280 Dementia in other diseases classified elsewhere without behavioral disturbance: Secondary | ICD-10-CM

## 2021-06-01 DIAGNOSIS — G309 Alzheimer's disease, unspecified: Secondary | ICD-10-CM

## 2021-06-01 DIAGNOSIS — I25119 Atherosclerotic heart disease of native coronary artery with unspecified angina pectoris: Secondary | ICD-10-CM | POA: Diagnosis not present

## 2021-06-01 DIAGNOSIS — K219 Gastro-esophageal reflux disease without esophagitis: Secondary | ICD-10-CM | POA: Diagnosis not present

## 2021-06-01 DIAGNOSIS — G609 Hereditary and idiopathic neuropathy, unspecified: Secondary | ICD-10-CM | POA: Diagnosis not present

## 2021-06-01 DIAGNOSIS — F339 Major depressive disorder, recurrent, unspecified: Secondary | ICD-10-CM | POA: Diagnosis not present

## 2021-06-01 DIAGNOSIS — R2689 Other abnormalities of gait and mobility: Secondary | ICD-10-CM | POA: Diagnosis not present

## 2021-06-01 DIAGNOSIS — R079 Chest pain, unspecified: Secondary | ICD-10-CM | POA: Diagnosis not present

## 2021-06-01 DIAGNOSIS — M6281 Muscle weakness (generalized): Secondary | ICD-10-CM | POA: Diagnosis not present

## 2021-06-01 NOTE — Progress Notes (Signed)
Location:   [facility] Room Number: [place] of Service:  ALF [phone]) Provider:  Veleta Miners MD  Virgie Dad, MD  Patient Care Team: Virgie Dad, MD as PCP - General (Internal Medicine) Elouise Munroe, MD as PCP - Cardiology (Cardiology)  Extended Emergency Contact Information Primary Emergency Contact: Weeks,[name] Address: [address]          [place], [place] [address] [name] of [place] Phone: [phone] Mobile Phone: [phone] Relation: Daughter Secondary Emergency Contact: [name], [place] of [place] Mobile Phone: [phone] Relation: Daughter  Code Status:  Full Code Goals of care: Advanced Directive information Advanced Directives 06/01/2021  Does Patient Have a Medical Advance Directive? Yes  Type of Paramedic of [place];Living will  Does patient want to make changes to medical advance directive? No - Patient declined  Copy of [place] in Chart? Yes - validated most recent copy scanned in chart (See row information)  Would patient like information on creating a medical advance directive? -     Chief Complaint  Patient presents with   Medical Management of Chronic Issues   Quality Metric Gaps    TETANUS/TDAP (Every 10 Years)   Zoster Vaccines- Shingrix (1 of 2) COVID-19 Vaccine      HPI:  Pt is a 87 y.o. male seen today for medical management of chronic diseases.    Has h/o Hypertension,HLD,  h/o CAD, Moderate AI, Chronic CKD stage 3, Alzheimer's Dementia  Admitted from 9/29-10/4 for NSTEMI, and Renal Insufficiency,   H/o Recurent Chest Pains .  Recent Diagnosis with  acute displaced right posterior seventh rib fracture  Patient seen today for routine but when I saw him he was having left Sided Chest Pain and got Sublingual Nitro This seems to be chronic Issue for him and has been to ED multiple times due to this He  denies any SOB Diaphoresis No Dizziness though his BP was low after getting Nitro Later Patient was better and Had his lunch Wt Readings from Last 3 Encounters:  06/01/21 145 lb (65.8 kg)  05/24/21 148 lb 9.6 oz (67.4 kg)  05/17/21 148 lb 9.6 oz (67.4 kg)    He stays in AL and Walks with his walker. Continues ot have issue with his cognition and Falls   Past Medical History:  Diagnosis Date   Anemia    Arthritis    arthritis,osteopenia,"spinal stenosis"   Bladder neck obstruction 03/28/2011   CAD (coronary artery disease)    Cancer ([place]) 12-06-12   Prostate cancer'98   CKD (chronic kidney disease) stage 3, GFR 30-59 ml/min ([place]) 08/17/2017   Depression    Essential hypertension, benign 04/10/2009   GERD (gastroesophageal reflux disease)    GERD without esophagitis 11/05/2016   H/O hiatal hernia    H/O vertigo 12-06-12   none recent   Hyperlipidemia    IBS (irritable bowel syndrome)    Mixed hyperlipidemia 04/10/2009   Neuropathy    Osteopenia    Peripheral neuropathy 12-06-12   peripheral neuropathy   Rhinitis    RLS (restless legs syndrome)    Vascular dementia ([place]) 09/30/2019   Vitamin B12 deficiency    Past Surgical History:  Procedure Laterality Date   APPENDECTOMY     CARPAL TUNNEL RELEASE     both hands   cataract surgery Left 12-06-12   recent surgery   CHOLECYSTECTOMY  COLONOSCOPY WITH PROPOFOL N/A 12/25/2012   Procedure: COLONOSCOPY WITH PROPOFOL;  Surgeon: Garlan Fair, MD;  Location: [facility];  Service: Endoscopy;  Laterality: N/A;   CORONARY STENT INTERVENTION N/A 03/13/2020   Procedure: CORONARY STENT INTERVENTION;  Surgeon: Nelva Bush, MD;  Location: [facility];  Service: Cardiovascular;  Laterality: N/A;   ESOPHAGOGASTRODUODENOSCOPY (EGD) WITH PROPOFOL N/A 12/25/2012   Procedure: ESOPHAGOGASTRODUODENOSCOPY (EGD) WITH PROPOFOL;  Surgeon: Garlan Fair, MD;  Location: [facility];  Service: Endoscopy;  Laterality: N/A;    INTRAVASCULAR ULTRASOUND/IVUS N/A 03/13/2020   Procedure: Intravascular Ultrasound/IVUS;  Surgeon: Nelva Bush, MD;  Location: [facility];  Service: Cardiovascular;  Laterality: N/A;   LEFT HEART CATH AND CORONARY ANGIOGRAPHY N/A 03/13/2020   Procedure: LEFT HEART CATH AND CORONARY ANGIOGRAPHY;  Surgeon: Nelva Bush, MD;  Location: [facility];  Service: Cardiovascular;  Laterality: N/A;   PROSTATE SURGERY  12-06-12   TONSILLECTOMY     TOTAL KNEE ARTHROPLASTY Right 11/25/2014   Procedure: RIGHT TOTAL KNEE ARTHROPLASTY;  Surgeon: Melrose Nakayama, MD;  Location: [facility];  Service: Orthopedics;  Laterality: Right;    Allergies  Allergen Reactions   Niacin Itching   Penicillins Other (See Comments)    "rash"    Tramadol Itching    Allergies as of 06/01/2021       Reactions   Niacin Itching   Penicillins Other (See Comments)   "rash"   Tramadol Itching        Medication List        Accurate as of June 01, 2021 11:44 AM. If you have any questions, ask your nurse or doctor.          acetaminophen 500 MG tablet Commonly known as: TYLENOL Take 1,000 mg by mouth in the morning and at bedtime.   aspirin EC 81 MG tablet Take 81 mg by mouth daily. Swallow whole.   atorvastatin 80 MG tablet Commonly known as: LIPITOR Take 1 tablet (80 mg total) by mouth daily.   gabapentin 100 MG capsule Commonly known as: NEURONTIN Take 200 mg by mouth 2 (two) times daily.   Iron (Ferrous Sulfate) 325 (65 Fe) MG Tabs Take 1 tablet by mouth. **PLEASE GIVE THIS MED WITH FOOD** Mon, Wed, and Fri   isosorbide mononitrate 30 MG 24 hr tablet Commonly known as: IMDUR Take 30 mg by mouth daily. Take 1/2 tab =15 mg, oral, Once A Day, **HOLD IF SBP IS LESS THAN 100**   lactose free nutrition Liqd Take 237 mLs by mouth daily.   nitroGLYCERIN 0.4 MG SL tablet Commonly known as: NITROSTAT Place 0.4 mg under the tongue every 5 (five) minutes as needed for chest pain. X  3   pantoprazole 40 MG tablet Commonly known as: PROTONIX Take 40 mg by mouth 2 (two) times daily.   sertraline 25 MG tablet Commonly known as: ZOLOFT Take 25 mg by mouth daily. Take with 100 mg tab to total 125 mg daily.   sertraline 100 MG tablet Commonly known as: ZOLOFT 1 tablet Daily for Mood   Vitamin D 50 MCG (2000 UT) tablet Take 1 pill daily.        Review of Systems  Unable to perform ROS: Dementia   Immunization History  Administered Date(s) Administered   Influenza, High Dose Seasonal PF 03/05/2015, 03/05/2018   Influenza-Unspecified 04/22/2016, 03/13/2017, 06/13/2018, 03/28/2019, 04/01/2021   Moderna Sars-Covid-2 Vaccination 06/17/2019, 07/15/2019, 04/21/2020   Pneumococcal Conjugate-13 05/23/2017   Pneumococcal Polysaccharide-23 01/20/2016  Unspecified SARS-COV-2 Vaccination 11/10/2020, 03/02/2021   Pertinent  Health Maintenance Due  Topic Date Due   INFLUENZA VACCINE  Completed   Fall Risk 03/16/2020 03/16/2020 05/01/2020 05/02/2020 02/13/2021  Falls in the past year? - - - - -  Was there an injury with Fall? - - - - -  Fall Risk Category Calculator - - - - -  Fall Risk Category - - - - -  Patient Fall Risk Level Moderate fall risk Moderate fall risk High fall risk Moderate fall risk Low fall risk  Patient at Risk for Falls Due to - - - - -  Fall risk Follow up - - - - -   Functional Status Survey:    Vitals:   06/01/21 1129  BP: 104/64  Pulse: 82  Resp: 18  Temp: 98 F (36.7 C)  SpO2: 94%  Weight: 145 lb (65.8 kg)  Height: 6' (1.829 m)   Body mass index is 19.67 kg/m. Physical Exam Vitals reviewed.  Constitutional:      Appearance: Normal appearance.  HENT:     Head: Normocephalic.     Nose: Nose normal.     Mouth/Throat:     Mouth: Mucous membranes are moist.     Pharynx: Oropharynx is clear.  Eyes:     Pupils: Pupils are equal, round, and reactive to light.  Cardiovascular:     Rate and Rhythm: Normal rate and regular rhythm.      Pulses: Normal pulses.     Heart sounds: No murmur heard. Pulmonary:     Effort: Pulmonary effort is normal. No respiratory distress.     Breath sounds: Normal breath sounds. No rales.  Abdominal:     General: Abdomen is flat. Bowel sounds are normal.     Palpations: Abdomen is soft.  Musculoskeletal:        General: No swelling.     Cervical back: Neck supple.  Skin:    General: Skin is warm.  Neurological:     General: No focal deficit present.     Mental Status: He is alert.  Psychiatric:        Mood and Affect: Mood normal.        Thought Content: Thought content normal.    Labs reviewed: Recent Labs    02/13/21 0509 04/13/21 0000 05/24/2021 2140  NA 140 144 140  K 4.6 4.3 4.4  CL 107 106 106  CO2 28 26* 25  GLUCOSE 100*  --  109*  BUN 30* 30* 30*  CREATININE 1.59* 1.6* 1.52*  CALCIUM 9.0 9.6 8.6*   Recent Labs    12/23/20 0000 04/13/21 0000 06/10/2021 2140  AST 13* 25 24  ALT 11 25 24   ALKPHOS 69 92 76  BILITOT  --   --  0.6  PROT  --   --  5.7*  ALBUMIN 3.8 4.6 3.4*   Recent Labs    12/23/20 0000 02/13/21 0509 04/13/21 0000 05/15/2021 2140  WBC 6.9 7.8 8.6 8.4  NEUTROABS 5,396.00 6.2  --  6.7  HGB 8.9* 9.0* 11.3* 9.6*  HCT 28* 28.6* 35* 30.6*  MCV  --  97.3  --  97.1  PLT 100* 133* 151 122*   Lab Results  Component Value Date   TSH 3.715 03/12/2020   Lab Results  Component Value Date   HGBA1C 5.1 09/10/2019   Lab Results  Component Value Date   CHOL 114 04/13/2021   HDL 37 04/13/2021   LDLCALC 59 04/13/2021  TRIG 101 04/13/2021   CHOLHDL 4.2 02/26/2020    Significant Diagnostic Results in last 30 days:  CT Head Wo Contrast  Result Date: 05/23/2021 CLINICAL DATA:  Trauma. EXAM: CT HEAD WITHOUT CONTRAST CT CERVICAL SPINE WITHOUT CONTRAST TECHNIQUE: Multidetector CT imaging of the head and cervical spine was performed following the standard protocol without intravenous contrast. Multiplanar CT image reconstructions of the cervical  spine were also generated. COMPARISON:  CT dated 09/05/2018. FINDINGS: CT HEAD FINDINGS Brain: Moderate age-related atrophy and chronic microvascular ischemic changes. There is no acute intracranial hemorrhage. No mass effect or midline shift. No extra-axial fluid collection. Vascular: No hyperdense vessel or unexpected calcification. Skull: Normal. Negative for fracture or focal lesion. Sinuses/Orbits: No acute finding. Other: None CT CERVICAL SPINE FINDINGS Alignment: No acute subluxation. Skull base and vertebrae: No acute fracture. Soft tissues and spinal canal: No prevertebral fluid or swelling. No visible canal hematoma. Disc levels:  Multilevel degenerative changes. Upper chest: Mildly displaced fracture of the right third and fourth ribs. Partially visualized small right pleural effusion. Other: Bilateral carotid bulb calcified plaques. IMPRESSION: 1. No acute intracranial pathology. Moderate age-related atrophy and chronic microvascular ischemic changes. 2. No acute/traumatic cervical spine pathology. 3. Mildly displaced fracture of the right third and fourth ribs. Partially visualized small right pleural effusion. Electronically Signed   By: Anner Crete M.D.   On: 05/23/2021 00:39   CT Cervical Spine Wo Contrast  Result Date: 05/23/2021 CLINICAL DATA:  Trauma. EXAM: CT HEAD WITHOUT CONTRAST CT CERVICAL SPINE WITHOUT CONTRAST TECHNIQUE: Multidetector CT imaging of the head and cervical spine was performed following the standard protocol without intravenous contrast. Multiplanar CT image reconstructions of the cervical spine were also generated. COMPARISON:  CT dated 09/05/2018. FINDINGS: CT HEAD FINDINGS Brain: Moderate age-related atrophy and chronic microvascular ischemic changes. There is no acute intracranial hemorrhage. No mass effect or midline shift. No extra-axial fluid collection. Vascular: No hyperdense vessel or unexpected calcification. Skull: Normal. Negative for fracture or focal  lesion. Sinuses/Orbits: No acute finding. Other: None CT CERVICAL SPINE FINDINGS Alignment: No acute subluxation. Skull base and vertebrae: No acute fracture. Soft tissues and spinal canal: No prevertebral fluid or swelling. No visible canal hematoma. Disc levels:  Multilevel degenerative changes. Upper chest: Mildly displaced fracture of the right third and fourth ribs. Partially visualized small right pleural effusion. Other: Bilateral carotid bulb calcified plaques. IMPRESSION: 1. No acute intracranial pathology. Moderate age-related atrophy and chronic microvascular ischemic changes. 2. No acute/traumatic cervical spine pathology. 3. Mildly displaced fracture of the right third and fourth ribs. Partially visualized small right pleural effusion. Electronically Signed   By: Anner Crete M.D.   On: 05/23/2021 00:39   DG Chest Port 1 View  Result Date: 06/11/2021 CLINICAL DATA:  Chest pain, history of coronary artery disease EXAM: PORTABLE CHEST 1 VIEW COMPARISON:  02/13/2021 FINDINGS: Single frontal view of the chest demonstrates an unremarkable cardiac silhouette. No airspace disease, effusion, or pneumothorax. There is an acute displaced right posterior seventh rib fracture. No other acute bony abnormalities. IMPRESSION: 1. Acute displaced right posterior seventh rib fracture. 2. Otherwise no acute intrathoracic process. Electronically Signed   By: Randa Ngo M.D.   On: 05/23/2021 22:05    Assessment/Plan Closed fracture of multiple ribs of right side with routine healing, subsequent encounter Pain Controlled on Plain Tylenol  Iron deficiency anemia due to chronic blood loss Continue Iron Hgb stable  Chest pain, unspecified type Un sure Etiology On Imdur and Statin Cannot increase the dose  due to Soft BP D/W social worker to talk to family about Goals of care to avoid Frequent ED visit Cardiology has said to follow PRN Coronary artery disease involving native coronary artery of native  heart with angina pectoris (HCC) On Aspirin, Statin  LDL 59 in 11/22 GERD without esophagitis On Prilosec Stage 3b chronic kidney disease ([place]) Creat stable Alzheimer's dementia without behavioral disturbance ([place]) Has failed Aricept due to GI effects Hereditary and idiopathic peripheral neuropathy Continue on Neurontin Depression, recurrent ([place]) On Zoloft Gait abnormality Continue to walk with the walker but stays high risk for falls Iron deficiency anemia due to chronic blood loss On Iron Has had Colon and EGD before  Family/ staff Communication:   Labs/tests ordered:   Total time spent in this patient care encounter was  45_  minutes; greater than 50% of the visit spent counseling patient and staff, reviewing records , Labs and coordinating care for problems addressed at this encounter.

## 2021-06-02 DIAGNOSIS — Z9181 History of falling: Secondary | ICD-10-CM | POA: Diagnosis not present

## 2021-06-02 DIAGNOSIS — R296 Repeated falls: Secondary | ICD-10-CM | POA: Diagnosis not present

## 2021-06-02 DIAGNOSIS — M6281 Muscle weakness (generalized): Secondary | ICD-10-CM | POA: Diagnosis not present

## 2021-06-02 DIAGNOSIS — R2689 Other abnormalities of gait and mobility: Secondary | ICD-10-CM | POA: Diagnosis not present

## 2021-06-03 DIAGNOSIS — R296 Repeated falls: Secondary | ICD-10-CM | POA: Diagnosis not present

## 2021-06-03 DIAGNOSIS — Z9181 History of falling: Secondary | ICD-10-CM | POA: Diagnosis not present

## 2021-06-03 DIAGNOSIS — M6281 Muscle weakness (generalized): Secondary | ICD-10-CM | POA: Diagnosis not present

## 2021-06-03 DIAGNOSIS — R2689 Other abnormalities of gait and mobility: Secondary | ICD-10-CM | POA: Diagnosis not present

## 2021-06-04 ENCOUNTER — Encounter: Payer: Self-pay | Admitting: Nurse Practitioner

## 2021-06-04 ENCOUNTER — Non-Acute Institutional Stay (INDEPENDENT_AMBULATORY_CARE_PROVIDER_SITE_OTHER): Payer: Medicare Other | Admitting: Nurse Practitioner

## 2021-06-04 DIAGNOSIS — R296 Repeated falls: Secondary | ICD-10-CM | POA: Diagnosis not present

## 2021-06-04 DIAGNOSIS — Z0000 Encounter for general adult medical examination without abnormal findings: Secondary | ICD-10-CM | POA: Diagnosis not present

## 2021-06-04 DIAGNOSIS — R2689 Other abnormalities of gait and mobility: Secondary | ICD-10-CM | POA: Diagnosis not present

## 2021-06-04 DIAGNOSIS — M6281 Muscle weakness (generalized): Secondary | ICD-10-CM | POA: Diagnosis not present

## 2021-06-04 DIAGNOSIS — Z9181 History of falling: Secondary | ICD-10-CM | POA: Diagnosis not present

## 2021-06-04 NOTE — Progress Notes (Addendum)
Subjective:   Ryan Humphrey is a 87 y.o. male who presents for Medicare Annual/Subsequent preventive examination at [place].      Objective:    Today's Vitals   06/04/21 0901 06/04/21 1459  BP: 128/62   Pulse: 82   Resp: 18   Temp: 97.6 F (36.4 C)   SpO2: 95%   Weight: 145 lb (65.8 kg)   Height: 6' (1.829 m)   PainSc:  0-No pain   Body mass index is 19.67 kg/m.  Advanced Directives 06/04/2021 06/01/2021 05/24/2021 05/17/2021 12/21/2020 12/11/2020 11/18/2020  Does Patient Have a Medical Advance Directive? Yes Yes Yes Yes Yes Yes Yes  Type of Paramedic of [place];Living will [place];Living will [place];Living will [place];Living will [place];Living will [place];Living will [place];Living will  Does patient want to make changes to medical advance directive? No - Patient declined No - Patient declined No - Patient declined No - Patient declined No - Patient declined No - Patient declined No - Patient declined  Copy of [place] in Chart? Yes - validated most recent copy scanned in chart (See row information) Yes - validated most recent copy scanned in chart (See row information) Yes - validated most recent copy scanned in chart (See row information) Yes - validated most recent copy scanned in chart (See row information) Yes - validated most recent copy scanned in chart (See row information) Yes - validated most recent copy scanned in chart (See row information) Yes - validated most recent copy scanned in chart (See row information)  Would patient like information on creating a medical advance directive? - - - - - - -    Current Medications (verified) Outpatient Encounter Medications as of 06/04/2021  Medication Sig   acetaminophen (TYLENOL) 500 MG tablet Take 1,000 mg by mouth in the  morning and at bedtime.   aspirin EC 81 MG tablet Take 81 mg by mouth daily. Swallow whole.   atorvastatin (LIPITOR) 80 MG tablet Take 1 tablet (80 mg total) by mouth daily.   Cholecalciferol (VITAMIN D) 50 MCG (2000 UT) tablet Take 1 pill daily.   gabapentin (NEURONTIN) 100 MG capsule Take 200 mg by mouth 2 (two) times daily.   Iron, Ferrous Sulfate, 325 (65 Fe) MG TABS Take 1 tablet by mouth. **PLEASE GIVE THIS MED WITH FOOD** Mon, Wed, and Fri   isosorbide mononitrate (IMDUR) 30 MG 24 hr tablet Take 30 mg by mouth daily. Take 1/2 tab =15 mg, oral, Once A Day, **HOLD IF SBP IS LESS THAN 100**   lactose free nutrition (BOOST) LIQD Take 237 mLs by mouth daily.   nitroGLYCERIN (NITROSTAT) 0.4 MG SL tablet Place 0.4 mg under the tongue every 5 (five) minutes as needed for chest pain. X 3   pantoprazole (PROTONIX) 40 MG tablet Take 40 mg by mouth 2 (two) times daily.   sertraline (ZOLOFT) 100 MG tablet 1 tablet Daily for Mood   sertraline (ZOLOFT) 25 MG tablet Take 25 mg by mouth daily. Take with 100 mg tab to total 125 mg daily.   No facility-administered encounter medications on file as of 06/04/2021.    Allergies (verified) Niacin, Penicillins, and Tramadol   History: Past Medical History:  Diagnosis Date   Anemia    Arthritis    arthritis,osteopenia,"spinal stenosis"   Bladder neck obstruction 03/28/2011   CAD (coronary artery disease)  Cancer ([place]) 12-06-12   Prostate cancer'98   CKD (chronic kidney disease) stage 3, GFR 30-59 ml/min (HCC) 08/17/2017   Depression    Essential hypertension, benign 04/10/2009   GERD (gastroesophageal reflux disease)    GERD without esophagitis 11/05/2016   H/O hiatal hernia    H/O vertigo 12-06-12   none recent   Hyperlipidemia    IBS (irritable bowel syndrome)    Mixed hyperlipidemia 04/10/2009   Neuropathy    Osteopenia    Peripheral neuropathy 12-06-12   peripheral neuropathy   Rhinitis    RLS (restless legs syndrome)    Vascular dementia  ([place]) 09/30/2019   Vitamin B12 deficiency    Past Surgical History:  Procedure Laterality Date   APPENDECTOMY     CARPAL TUNNEL RELEASE     both hands   cataract surgery Left 12-06-12   recent surgery   CHOLECYSTECTOMY     COLONOSCOPY WITH PROPOFOL N/A 12/25/2012   Procedure: COLONOSCOPY WITH PROPOFOL;  Surgeon: Garlan Fair, MD;  Location: [facility];  Service: Endoscopy;  Laterality: N/A;   CORONARY STENT INTERVENTION N/A 03/13/2020   Procedure: CORONARY STENT INTERVENTION;  Surgeon: Nelva Bush, MD;  Location: [facility];  Service: Cardiovascular;  Laterality: N/A;   ESOPHAGOGASTRODUODENOSCOPY (EGD) WITH PROPOFOL N/A 12/25/2012   Procedure: ESOPHAGOGASTRODUODENOSCOPY (EGD) WITH PROPOFOL;  Surgeon: Garlan Fair, MD;  Location: [facility];  Service: Endoscopy;  Laterality: N/A;   INTRAVASCULAR ULTRASOUND/IVUS N/A 03/13/2020   Procedure: Intravascular Ultrasound/IVUS;  Surgeon: Nelva Bush, MD;  Location: [facility];  Service: Cardiovascular;  Laterality: N/A;   LEFT HEART CATH AND CORONARY ANGIOGRAPHY N/A 03/13/2020   Procedure: LEFT HEART CATH AND CORONARY ANGIOGRAPHY;  Surgeon: Nelva Bush, MD;  Location: [facility];  Service: Cardiovascular;  Laterality: N/A;   PROSTATE SURGERY  12-06-12   TONSILLECTOMY     TOTAL KNEE ARTHROPLASTY Right 11/25/2014   Procedure: RIGHT TOTAL KNEE ARTHROPLASTY;  Surgeon: Melrose Nakayama, MD;  Location: [facility];  Service: Orthopedics;  Laterality: Right;   Family History  Problem Relation Age of Onset   CAD Father        Deceased, 17   Dementia Mother        Deceased, 56   Diabetes Brother    Hyperlipidemia Brother    Hypertension Brother    Dementia Brother    Colon cancer Neg Hx    Colon polyps Neg Hx    Kidney disease Neg Hx    Esophageal cancer Neg Hx    Gallbladder disease Neg Hx    Social History   Socioeconomic History   Marital status: Widowed    Spouse name: Not on file   Number of  children: 2   Years of education: College   Highest education level: Not on file  Occupational History   Occupation: Retired  Tobacco Use   Smoking status: Former    Packs/day: 0.50    Years: 30.00    Pack years: 15.00    Types: Cigarettes    Quit date: 06/14/1963    Years since quitting: 58.0   Smokeless tobacco: Never  Vaping Use   Vaping Use: Never used  Substance and Sexual Activity   Alcohol use: Yes    Alcohol/week: 0.0 standard drinks    Comment: Socially   Drug use: No   Sexual activity: Not Currently  Other Topics Concern   Not on file  Social History Narrative   Pt lives at home alone, widowed in  2011, married for 55 years. They have two grown daugthers.   Plays the piano.   Caffeine Use: Rarely   Social Determinants of Health   Financial Resource Strain: Not on file  Food Insecurity: Not on file  Transportation Needs: Not on file  Physical Activity: Not on file  Stress: Not on file  Social Connections: Not on file    Tobacco Counseling Counseling given: Not Answered   Clinical Intake:  Pre-visit preparation completed: Yes  Pain : No/denies pain Pain Score: 0-No pain     BMI - recorded: 19.67 Nutritional Status: BMI of 19-24  Normal Nutritional Risks: None Diabetes: No  How often do you need to have someone help you when you read instructions, pamphlets, or other written materials from your doctor or pharmacy?: 1 - Never What is the last grade level you completed in school?: college  Diabetic?no  Interpreter Needed?: No  Information entered by :: Lindi Abram Bretta Bang NP   Activities of Daily Living In your present state of health, do you have any difficulty performing the following activities: 06/04/2021  Hearing? Y  Vision? N  Difficulty concentrating or making decisions? Y  Walking or climbing stairs? Y  Dressing or bathing? Y  Doing errands, shopping? Y  Preparing Food and eating ? N  Using the Toilet? N  In the past six months, have you  accidently leaked urine? N  Do you have problems with loss of bowel control? N  Managing your Medications? Y  Managing your Finances? Y  Housekeeping or managing your Housekeeping? Y  Some recent data might be hidden    Patient Care Team: Virgie Dad, MD as PCP - General (Internal Medicine) Elouise Munroe, MD as PCP - Cardiology (Cardiology)  Indicate any recent Medical Services you may have received from other than [facility] providers in the past year (date may be approximate).     Assessment:   This is a routine wellness examination for Ryan Humphrey.  Hearing/Vision screen No results found.  Dietary issues and exercise activities discussed: Current Exercise Habits: The patient does not participate in regular exercise at present, Exercise limited by: cardiac condition(s);orthopedic condition(s);neurologic condition(s);psychological condition(s)   Goals Addressed             This Visit's Progress    Maintain Mobility and Function       Evidence-based guidance:  Emphasize the importance of physical activity and aerobic exercise as included in treatment plan; assess barriers to adherence; consider patient's abilities and preferences.  Encourage gradual increase in activity or exercise instead of stopping if pain occurs.  Reinforce individual therapy exercise prescription, such as strengthening, stabilization and stretching programs.  Promote optimal body mechanics to stabilize the spine with lifting and functional activity.  Encourage activity and mobility modifications to facilitate optimal function, such as using a log roll for bed mobility or dressing from a seated position.  Reinforce individual adaptive equipment recommendations to limit excessive spinal movements, such as a Systems analyst.  Assess adequacy of sleep; encourage use of sleep hygiene techniques, such as bedtime routine; use of white noise; dark, cool bedroom; avoiding daytime naps, heavy meals or exercise  before bedtime.  Promote positions and modification to optimize sleep and sexual activity; consider pillows or positioning devices to assist in maintaining neutral spine.  Explore options for applying ergonomic principles at work and home, such as frequent position changes, using ergonomically designed equipment and working at optimal height.  Promote modifications to increase comfort with driving such  as lumbar support, optimizing seat and steering wheel position, using cruise control and taking frequent rest stops to stretch and walk.   Notes:        Depression Screen PHQ 2/9 Scores 02/26/2020 09/09/2019 02/14/2019 07/26/2018 12/03/2017 05/23/2017 04/22/2016  PHQ - 2 Score 0 0 0 0 0 0 0  PHQ- 9 Score 0 - - - - - -    Fall Risk Fall Risk  02/26/2020 09/09/2019 02/14/2019 07/26/2018 12/03/2017  Falls in the past year? 0 0 0 0 No  Number falls in past yr: 0 - 0 - -  Injury with Fall? 0 - 0 - -  Risk Factor Category  - - - - -  Risk for fall due to : No Fall Risks No Fall Risks - - -  Follow up Falls evaluation completed;Falls prevention discussed Falls prevention discussed;Education provided;Falls evaluation completed - - -    FALL RISK PREVENTION PERTAINING TO THE HOME:  Any stairs in or around the home? Yes  If so, are there any without handrails? No  Home free of loose throw rugs in walkways, pet beds, electrical cords, etc? Yes  Adequate lighting in your home to reduce risk of falls? Yes   ASSISTIVE DEVICES UTILIZED TO PREVENT FALLS:  Life alert? No  Use of a cane, walker or w/c? Yes  Grab bars in the bathroom? Yes  Shower chair or bench in shower? Yes  Elevated toilet seat or a handicapped toilet? Yes   TIMED UP AND GO:  Was the test performed? Yes .  Length of time to ambulate 10 feet: 18 sec.   Gait unsteady with use of assistive device, provider informed and education provided.   Cognitive Function: MMSE - Mini Mental State Exam 06/10/2021  Orientation to time 3   Orientation to Place 4  Registration 3  Attention/ Calculation 4  Recall 0  Language- name 2 objects 2  Language- repeat 1  Language- follow 3 step command 3  Language- read & follow direction 1  Write a sentence 1  Copy design 0  Total score 22   Montreal Cognitive Assessment  11/09/2017 08/19/2016 08/20/2015 04/29/2014  Visuospatial/ Executive (0/5) 4 5 4 5   Naming (0/3) 3 2 2 2   Attention: Read list of digits (0/2) 2 2 2 2   Attention: Read list of letters (0/1) 1 1 1 1   Attention: Serial 7 subtraction starting at 100 (0/3) 3 3 3 3   Language: Repeat phrase (0/2) 2 2 2 2   Language : Fluency (0/1) 1 1 1 1   Abstraction (0/2) 2 2 2 2   Delayed Recall (0/5) 0 0 0 3  Orientation (0/6) 5 6 6 6   Total 23 24 23 27   Adjusted Score (based on education) 23 24 - 27      Immunizations Immunization History  Administered Date(s) Administered   Influenza, High Dose Seasonal PF 03/05/2015, 03/05/2018   Influenza-Unspecified 04/22/2016, 03/13/2017, 06/13/2018, 03/28/2019, 04/01/2021   Moderna Sars-Covid-2 Vaccination 06/17/2019, 07/15/2019, 04/21/2020   Pneumococcal Conjugate-13 05/23/2017   Pneumococcal Polysaccharide-23 01/20/2016   Unspecified SARS-COV-2 Vaccination 11/10/2020, 03/02/2021    TDAP status: Due, Education has been provided regarding the importance of this vaccine. Advised may receive this vaccine at local pharmacy or Health Dept. Aware to provide a copy of the vaccination record if obtained from local pharmacy or Health Dept. Verbalized acceptance and understanding.  Flu Vaccine status: Up to date  Pneumococcal vaccine status: Up to date  Covid-19 vaccine status: Information provided on how to  obtain vaccines.   Qualifies for Shingles Vaccine? Yes   Zostavax completed No   Shingrix Completed?: No.    Education has been provided regarding the importance of this vaccine. Patient has been advised to call insurance company to determine out of pocket expense if they have not  yet received this vaccine. Advised may also receive vaccine at local pharmacy or Health Dept. Verbalized acceptance and understanding.  Screening Tests Health Maintenance  Topic Date Due   TETANUS/TDAP  Never done   Zoster Vaccines- Shingrix (1 of 2) Never done   COVID-19 Vaccine (6 - Booster for Moderna series) 04/27/2021   Pneumonia Vaccine 60+ Years old  Completed   INFLUENZA VACCINE  Completed   HPV VACCINES  Aged Out    Health Maintenance  Health Maintenance Due  Topic Date Due   TETANUS/TDAP  Never done   Zoster Vaccines- Shingrix (1 of 2) Never done   COVID-19 Vaccine (6 - Booster for Moderna series) 04/27/2021    Colorectal cancer screening: No longer required.   Lung Cancer Screening: (Low Dose CT Chest recommended if Age 36-80 years, 30 pack-year currently smoking OR have quit w/in 15years.) does not qualify.   Lung Cancer Screening Referral: none   Hepatitis C Screening: does not qualify  Vision Screening: Recommended annual ophthalmology exams for early detection of glaucoma and other disorders of the eye. Is the patient up to date with their annual eye exam?  No  Who is the provider or what is the name of the office in which the patient attends annual eye exams? Will refer If pt is not established with a provider, would they like to be referred to a provider to establish care? No .   Dental Screening: Recommended annual dental exams for proper oral hygiene  Community Resource Referral / Chronic Care Management: CRR required this visit?  No   CCM required this visit?  No      Plan:     I have personally reviewed and noted the following in the patients chart:   Medical and social history Use of alcohol, tobacco or illicit drugs  Current medications and supplements including opioid prescriptions. Patient is not currently taking opioid prescriptions. Functional ability and status Nutritional status Physical activity Advanced directives List of other  physicians Hospitalizations, surgeries, and ER visits in previous 12 months Vitals Screenings to include cognitive, depression, and falls Referrals and appointments  In addition, I have reviewed and discussed with patient certain preventive protocols, quality metrics, and best practice recommendations. A written personalized care plan for preventive services as well as general preventive health recommendations were provided to patient.   MMSE. Tdap, Shingrix, Ophthalmology referral prescription provided.   Krystie Leiter X Annalyn Blecher, NP   06/10/2021

## 2021-06-08 DIAGNOSIS — R2689 Other abnormalities of gait and mobility: Secondary | ICD-10-CM | POA: Diagnosis not present

## 2021-06-08 DIAGNOSIS — Z9181 History of falling: Secondary | ICD-10-CM | POA: Diagnosis not present

## 2021-06-08 DIAGNOSIS — R296 Repeated falls: Secondary | ICD-10-CM | POA: Diagnosis not present

## 2021-06-08 DIAGNOSIS — M6281 Muscle weakness (generalized): Secondary | ICD-10-CM | POA: Diagnosis not present

## 2021-06-09 DIAGNOSIS — M6281 Muscle weakness (generalized): Secondary | ICD-10-CM | POA: Diagnosis not present

## 2021-06-09 DIAGNOSIS — R296 Repeated falls: Secondary | ICD-10-CM | POA: Diagnosis not present

## 2021-06-09 DIAGNOSIS — R2689 Other abnormalities of gait and mobility: Secondary | ICD-10-CM | POA: Diagnosis not present

## 2021-06-09 DIAGNOSIS — Z9181 History of falling: Secondary | ICD-10-CM | POA: Diagnosis not present

## 2021-06-10 DIAGNOSIS — M6281 Muscle weakness (generalized): Secondary | ICD-10-CM | POA: Diagnosis not present

## 2021-06-10 DIAGNOSIS — R2689 Other abnormalities of gait and mobility: Secondary | ICD-10-CM | POA: Diagnosis not present

## 2021-06-10 DIAGNOSIS — R296 Repeated falls: Secondary | ICD-10-CM | POA: Diagnosis not present

## 2021-06-10 DIAGNOSIS — Z9181 History of falling: Secondary | ICD-10-CM | POA: Diagnosis not present

## 2021-06-11 DIAGNOSIS — M6281 Muscle weakness (generalized): Secondary | ICD-10-CM | POA: Diagnosis not present

## 2021-06-11 DIAGNOSIS — R296 Repeated falls: Secondary | ICD-10-CM | POA: Diagnosis not present

## 2021-06-11 DIAGNOSIS — Z9181 History of falling: Secondary | ICD-10-CM | POA: Diagnosis not present

## 2021-06-11 DIAGNOSIS — R2689 Other abnormalities of gait and mobility: Secondary | ICD-10-CM | POA: Diagnosis not present

## 2021-06-13 DIAGNOSIS — 419620001 Death: Secondary | SNOMED CT | POA: Diagnosis not present

## 2021-06-13 DEATH — deceased

## 2021-06-15 DIAGNOSIS — Z9181 History of falling: Secondary | ICD-10-CM | POA: Diagnosis not present

## 2021-06-15 DIAGNOSIS — M6281 Muscle weakness (generalized): Secondary | ICD-10-CM | POA: Diagnosis not present

## 2021-06-15 DIAGNOSIS — R2689 Other abnormalities of gait and mobility: Secondary | ICD-10-CM | POA: Diagnosis not present

## 2021-06-15 DIAGNOSIS — R296 Repeated falls: Secondary | ICD-10-CM | POA: Diagnosis not present

## 2021-06-16 DIAGNOSIS — R296 Repeated falls: Secondary | ICD-10-CM | POA: Diagnosis not present

## 2021-06-16 DIAGNOSIS — Z9181 History of falling: Secondary | ICD-10-CM | POA: Diagnosis not present

## 2021-06-16 DIAGNOSIS — M6281 Muscle weakness (generalized): Secondary | ICD-10-CM | POA: Diagnosis not present

## 2021-06-16 DIAGNOSIS — R2689 Other abnormalities of gait and mobility: Secondary | ICD-10-CM | POA: Diagnosis not present

## 2021-06-17 DIAGNOSIS — R2689 Other abnormalities of gait and mobility: Secondary | ICD-10-CM | POA: Diagnosis not present

## 2021-06-17 DIAGNOSIS — M6281 Muscle weakness (generalized): Secondary | ICD-10-CM | POA: Diagnosis not present

## 2021-06-17 DIAGNOSIS — R296 Repeated falls: Secondary | ICD-10-CM | POA: Diagnosis not present

## 2021-06-17 DIAGNOSIS — Z9181 History of falling: Secondary | ICD-10-CM | POA: Diagnosis not present

## 2021-06-21 DIAGNOSIS — Z9181 History of falling: Secondary | ICD-10-CM | POA: Diagnosis not present

## 2021-06-21 DIAGNOSIS — R2689 Other abnormalities of gait and mobility: Secondary | ICD-10-CM | POA: Diagnosis not present

## 2021-06-21 DIAGNOSIS — M6281 Muscle weakness (generalized): Secondary | ICD-10-CM | POA: Diagnosis not present

## 2021-06-21 DIAGNOSIS — R296 Repeated falls: Secondary | ICD-10-CM | POA: Diagnosis not present

## 2021-06-22 DIAGNOSIS — M6281 Muscle weakness (generalized): Secondary | ICD-10-CM | POA: Diagnosis not present

## 2021-06-22 DIAGNOSIS — R296 Repeated falls: Secondary | ICD-10-CM | POA: Diagnosis not present

## 2021-06-22 DIAGNOSIS — Z9181 History of falling: Secondary | ICD-10-CM | POA: Diagnosis not present

## 2021-06-22 DIAGNOSIS — R2689 Other abnormalities of gait and mobility: Secondary | ICD-10-CM | POA: Diagnosis not present

## 2021-06-23 DIAGNOSIS — R296 Repeated falls: Secondary | ICD-10-CM | POA: Diagnosis not present

## 2021-06-23 DIAGNOSIS — Z9181 History of falling: Secondary | ICD-10-CM | POA: Diagnosis not present

## 2021-06-23 DIAGNOSIS — R2689 Other abnormalities of gait and mobility: Secondary | ICD-10-CM | POA: Diagnosis not present

## 2021-06-23 DIAGNOSIS — M6281 Muscle weakness (generalized): Secondary | ICD-10-CM | POA: Diagnosis not present

## 2021-06-24 DIAGNOSIS — R2689 Other abnormalities of gait and mobility: Secondary | ICD-10-CM | POA: Diagnosis not present

## 2021-06-24 DIAGNOSIS — R296 Repeated falls: Secondary | ICD-10-CM | POA: Diagnosis not present

## 2021-06-24 DIAGNOSIS — M6281 Muscle weakness (generalized): Secondary | ICD-10-CM | POA: Diagnosis not present

## 2021-06-24 DIAGNOSIS — Z9181 History of falling: Secondary | ICD-10-CM | POA: Diagnosis not present

## 2021-06-25 DIAGNOSIS — Z9181 History of falling: Secondary | ICD-10-CM | POA: Diagnosis not present

## 2021-06-25 DIAGNOSIS — M6281 Muscle weakness (generalized): Secondary | ICD-10-CM | POA: Diagnosis not present

## 2021-06-25 DIAGNOSIS — R2689 Other abnormalities of gait and mobility: Secondary | ICD-10-CM | POA: Diagnosis not present

## 2021-06-25 DIAGNOSIS — R296 Repeated falls: Secondary | ICD-10-CM | POA: Diagnosis not present

## 2021-06-28 DIAGNOSIS — R296 Repeated falls: Secondary | ICD-10-CM | POA: Diagnosis not present

## 2021-06-28 DIAGNOSIS — Z9181 History of falling: Secondary | ICD-10-CM | POA: Diagnosis not present

## 2021-06-28 DIAGNOSIS — R2689 Other abnormalities of gait and mobility: Secondary | ICD-10-CM | POA: Diagnosis not present

## 2021-06-28 DIAGNOSIS — M6281 Muscle weakness (generalized): Secondary | ICD-10-CM | POA: Diagnosis not present

## 2021-06-30 DIAGNOSIS — Z9181 History of falling: Secondary | ICD-10-CM | POA: Diagnosis not present

## 2021-06-30 DIAGNOSIS — R296 Repeated falls: Secondary | ICD-10-CM | POA: Diagnosis not present

## 2021-06-30 DIAGNOSIS — M6281 Muscle weakness (generalized): Secondary | ICD-10-CM | POA: Diagnosis not present

## 2021-06-30 DIAGNOSIS — R2689 Other abnormalities of gait and mobility: Secondary | ICD-10-CM | POA: Diagnosis not present

## 2021-07-01 DIAGNOSIS — Z9181 History of falling: Secondary | ICD-10-CM | POA: Diagnosis not present

## 2021-07-01 DIAGNOSIS — R296 Repeated falls: Secondary | ICD-10-CM | POA: Diagnosis not present

## 2021-07-01 DIAGNOSIS — R2689 Other abnormalities of gait and mobility: Secondary | ICD-10-CM | POA: Diagnosis not present

## 2021-07-01 DIAGNOSIS — M6281 Muscle weakness (generalized): Secondary | ICD-10-CM | POA: Diagnosis not present

## 2021-07-02 DIAGNOSIS — R296 Repeated falls: Secondary | ICD-10-CM | POA: Diagnosis not present

## 2021-07-02 DIAGNOSIS — Z9181 History of falling: Secondary | ICD-10-CM | POA: Diagnosis not present

## 2021-07-02 DIAGNOSIS — M6281 Muscle weakness (generalized): Secondary | ICD-10-CM | POA: Diagnosis not present

## 2021-07-02 DIAGNOSIS — R2689 Other abnormalities of gait and mobility: Secondary | ICD-10-CM | POA: Diagnosis not present

## 2021-07-06 DIAGNOSIS — M6281 Muscle weakness (generalized): Secondary | ICD-10-CM | POA: Diagnosis not present

## 2021-07-06 DIAGNOSIS — Z9181 History of falling: Secondary | ICD-10-CM | POA: Diagnosis not present

## 2021-07-06 DIAGNOSIS — R296 Repeated falls: Secondary | ICD-10-CM | POA: Diagnosis not present

## 2021-07-06 DIAGNOSIS — R2689 Other abnormalities of gait and mobility: Secondary | ICD-10-CM | POA: Diagnosis not present

## 2021-07-07 DIAGNOSIS — Z9181 History of falling: Secondary | ICD-10-CM | POA: Diagnosis not present

## 2021-07-07 DIAGNOSIS — R2689 Other abnormalities of gait and mobility: Secondary | ICD-10-CM | POA: Diagnosis not present

## 2021-07-07 DIAGNOSIS — M6281 Muscle weakness (generalized): Secondary | ICD-10-CM | POA: Diagnosis not present

## 2021-07-07 DIAGNOSIS — R296 Repeated falls: Secondary | ICD-10-CM | POA: Diagnosis not present

## 2021-07-08 DIAGNOSIS — E039 Hypothyroidism, unspecified: Secondary | ICD-10-CM | POA: Diagnosis not present

## 2021-07-08 LAB — TSH: TSH: 3.97 (ref 0.41–5.90)

## 2021-07-09 ENCOUNTER — Non-Acute Institutional Stay: Payer: Medicare Other | Admitting: Nurse Practitioner

## 2021-07-09 ENCOUNTER — Encounter: Payer: Self-pay | Admitting: Nurse Practitioner

## 2021-07-09 DIAGNOSIS — F0280 Dementia in other diseases classified elsewhere without behavioral disturbance: Secondary | ICD-10-CM

## 2021-07-09 DIAGNOSIS — R2689 Other abnormalities of gait and mobility: Secondary | ICD-10-CM | POA: Diagnosis not present

## 2021-07-09 DIAGNOSIS — R296 Repeated falls: Secondary | ICD-10-CM | POA: Diagnosis not present

## 2021-07-09 DIAGNOSIS — Z9181 History of falling: Secondary | ICD-10-CM | POA: Diagnosis not present

## 2021-07-09 DIAGNOSIS — G609 Hereditary and idiopathic neuropathy, unspecified: Secondary | ICD-10-CM | POA: Diagnosis not present

## 2021-07-09 DIAGNOSIS — D500 Iron deficiency anemia secondary to blood loss (chronic): Secondary | ICD-10-CM | POA: Diagnosis not present

## 2021-07-09 DIAGNOSIS — I10 Essential (primary) hypertension: Secondary | ICD-10-CM

## 2021-07-09 DIAGNOSIS — R269 Unspecified abnormalities of gait and mobility: Secondary | ICD-10-CM | POA: Diagnosis not present

## 2021-07-09 DIAGNOSIS — N1832 Chronic kidney disease, stage 3b: Secondary | ICD-10-CM

## 2021-07-09 DIAGNOSIS — F339 Major depressive disorder, recurrent, unspecified: Secondary | ICD-10-CM | POA: Diagnosis not present

## 2021-07-09 DIAGNOSIS — K219 Gastro-esophageal reflux disease without esophagitis: Secondary | ICD-10-CM

## 2021-07-09 DIAGNOSIS — M6281 Muscle weakness (generalized): Secondary | ICD-10-CM | POA: Diagnosis not present

## 2021-07-09 DIAGNOSIS — G309 Alzheimer's disease, unspecified: Secondary | ICD-10-CM

## 2021-07-09 DIAGNOSIS — I214 Non-ST elevation (NSTEMI) myocardial infarction: Secondary | ICD-10-CM

## 2021-07-09 NOTE — Assessment & Plan Note (Signed)
had Colon and EGD, takes Iron, Hgb 9.6 05/25/2021 at his baseline.

## 2021-07-09 NOTE — Assessment & Plan Note (Signed)
Stable, , takes Pantoprazole

## 2021-07-09 NOTE — Assessment & Plan Note (Signed)
Ambulates with walker, risk of falling is high.

## 2021-07-09 NOTE — Progress Notes (Addendum)
Location:   [facility] Room Number: 907 Place of Service:  ALF [phone]) Provider:  Maurie Musco X, NP  Virgie Dad, MD  Patient Care Team: Virgie Dad, MD as PCP - General (Internal Medicine) Elouise Munroe, MD as PCP - Cardiology (Cardiology)  Extended Emergency Contact Information Primary Emergency Contact: Weeks,[name] Address: [address]          [place], [place] [address] [name] of [place] Phone: [phone] Mobile Phone: [phone] Relation: Daughter Secondary Emergency Contact: [name], [place] of [place] Mobile Phone: [phone] Relation: Daughter  Code Status:  FULL CODE Goals of care: Advanced Directive information Advanced Directives 07/09/2021  Does Patient Have a Medical Advance Directive? Yes  Type of Paramedic of [place];Living will  Does patient want to make changes to medical advance directive? No - Patient declined  Copy of [place] in Chart? Yes - validated most recent copy scanned in chart (See row information)  Would patient like information on creating a medical advance directive? -     Chief Complaint  Patient presents with   Medical Management of Chronic Issues    Routine folow up visit.   Health Maintenance    Tetanus/tdap, shingrix vaccine, 4th COVID booster    HPI:  Pt is a 88 y.o. male seen today for medical management of chronic diseases.     R ribs fx 3rd, 4th, 7th, healed.              Anemia, had Colon and EGD, takes Iron, Hgb 9.6 05/14/2021             CAD, NSTEMI stent f/u cardiology. Hospital stay 03/11/20-03/16/20 for NSTEMI, troponin 300s at ED, Cath showed LAD stenosis, underwent drug eluting stent, EF 55-60%, takes Atorvastatin, ASA,, Added Isosorbide 15mg  since last chest pain 05/01/20. 02/13/21 ED for chest pain, f/u Cardiology 04/06/21, took him off Plavix.              HTN, controlled, off Metoprolol.              GERD, takes Pantoprazole             AI, EF 55-60%             CKD stage 3, Bun/creat 30/1.52 05/27/2021 Alzheimer's dementia, didn't tolerate memory meds, underwent neurology eval in the past. TSH 3.97 07/08/21             Peripheral neuropathy, takes Gabapentin             Depression takes Sertraline             Gait abnormality, risk of falling is high.       Past Medical History:  Diagnosis Date   Anemia    Arthritis    arthritis,osteopenia,"spinal stenosis"   Bladder neck obstruction 03/28/2011   CAD (coronary artery disease)    Cancer ([place]) 12-06-12   Prostate cancer'98   CKD (chronic kidney disease) stage 3, GFR 30-59 ml/min ([place]) 08/17/2017   Depression    Essential hypertension, benign 04/10/2009   GERD (gastroesophageal reflux disease)    GERD without esophagitis 11/05/2016   H/O hiatal hernia    H/O vertigo 12-06-12   none recent   Hyperlipidemia    IBS (irritable bowel syndrome)    Mixed hyperlipidemia 04/10/2009   Neuropathy    Osteopenia    Peripheral neuropathy  12-06-12   peripheral neuropathy   Rhinitis    RLS (restless legs syndrome)    Vascular dementia ([facility]) 09/30/2019   Vitamin B12 deficiency    Past Surgical History:  Procedure Laterality Date   APPENDECTOMY     CARPAL TUNNEL RELEASE     both hands   cataract surgery Left 12-06-12   recent surgery   CHOLECYSTECTOMY     COLONOSCOPY WITH PROPOFOL N/A 12/25/2012   Procedure: COLONOSCOPY WITH PROPOFOL;  Surgeon: Garlan Fair, MD;  [facility]: [facility];  Service: Endoscopy;  Laterality: N/A;   CORONARY STENT INTERVENTION N/A 03/13/2020   Procedure: CORONARY STENT INTERVENTION;  Surgeon: Nelva Bush, MD;  Location: [facility];  Service: Cardiovascular;  Laterality: N/A;   ESOPHAGOGASTRODUODENOSCOPY (EGD) WITH PROPOFOL N/A 12/25/2012   Procedure: ESOPHAGOGASTRODUODENOSCOPY (EGD) WITH PROPOFOL;  Surgeon: Garlan Fair, MD;  [facility];  Service: Endoscopy;  Laterality: N/A;    INTRAVASCULAR ULTRASOUND/IVUS N/A 03/13/2020   Procedure: Intravascular Ultrasound/IVUS;  Surgeon: Nelva Bush, MD;  Location: [facility];  Service: Cardiovascular;  Laterality: N/A;   LEFT HEART CATH AND CORONARY ANGIOGRAPHY N/A 03/13/2020   Procedure: LEFT HEART CATH AND CORONARY ANGIOGRAPHY;  Surgeon: Nelva Bush, MD;  Location: [facility];  Service: Cardiovascular;  Laterality: N/A;   PROSTATE SURGERY  12-06-12   TONSILLECTOMY     TOTAL KNEE ARTHROPLASTY Right 11/25/2014   Procedure: RIGHT TOTAL KNEE ARTHROPLASTY;  Surgeon: Melrose Nakayama, MD;  Location: [facility];  Service: Orthopedics;  Laterality: Right;    Allergies  Allergen Reactions   Niacin Itching   Penicillins Other (See Comments)    "rash"    Tramadol Itching    Allergies as of 07/09/2021       Reactions   Niacin Itching   Penicillins Other (See Comments)   "rash"   Tramadol Itching        Medication List        Accurate as of July 09, 2021  3:56 PM. If you have any questions, ask your nurse or doctor.          acetaminophen 500 MG tablet Commonly known as: TYLENOL Take 1,000 mg by mouth in the morning and at bedtime.   aspirin EC 81 MG tablet Take 81 mg by mouth daily. Swallow whole.   atorvastatin 80 MG tablet Commonly known as: LIPITOR Take 1 tablet (80 mg total) by mouth daily.   gabapentin 100 MG capsule Commonly known as: NEURONTIN Take 200 mg by mouth 2 (two) times daily.   Iron (Ferrous Sulfate) 325 (65 Fe) MG Tabs Take 1 tablet by mouth. **PLEASE GIVE THIS MED WITH FOOD** Mon, Wed, and Fri   isosorbide mononitrate 30 MG 24 hr tablet Commonly known as: IMDUR Take 30 mg by mouth daily. Take 1/2 tab =15 mg, oral, Once A Day, **HOLD IF SBP IS LESS THAN 100**   lactose free nutrition Liqd Take 237 mLs by mouth daily.   nitroGLYCERIN 0.4 MG SL tablet Commonly known as: NITROSTAT Place 0.4 mg under the tongue every 5 (five) minutes as needed for chest pain. X 3    pantoprazole 40 MG tablet Commonly known as: PROTONIX Take 40 mg by mouth 2 (two) times daily.   sertraline 25 MG tablet Commonly known as: ZOLOFT Take 25 mg by mouth daily. Take with 100 mg tab to total 125 mg daily.   sertraline 100 MG tablet Commonly known as: ZOLOFT 1 tablet Daily for Mood   Vitamin  D 50 MCG (2000 UT) tablet Take 1 pill daily.        Review of Systems  Unable to perform ROS: Dementia   Immunization History  Administered Date(s) Administered   Influenza, High Dose Seasonal PF 03/05/2015, 03/05/2018   Influenza-Unspecified 04/22/2016, 03/13/2017, 06/13/2018, 03/28/2019, 04/01/2021   Moderna Sars-Covid-2 Vaccination 06/17/2019, 07/15/2019, 04/21/2020   Pneumococcal Conjugate-13 05/23/2017   Pneumococcal Polysaccharide-23 01/20/2016   Tdap 06/21/2021   Unspecified SARS-COV-2 Vaccination 11/10/2020, 03/02/2021   Zoster Recombinat (Shingrix) 06/16/2021   Pertinent  Health Maintenance Due  Topic Date Due   INFLUENZA VACCINE  Completed   Fall Risk 03/16/2020 03/16/2020 05/01/2020 05/02/2020 02/13/2021  Falls in the past year? - - - - -  Was there an injury with Fall? - - - - -  Fall Risk Category Calculator - - - - -  Fall Risk Category - - - - -  Patient Fall Risk Level Moderate fall risk Moderate fall risk High fall risk Moderate fall risk Low fall risk  Patient at Risk for Falls Due to - - - - -  Fall risk Follow up - - - - -   Functional Status Survey:    Vitals:   07/09/21 1105  BP: (!) 108/59  Pulse: 77  Resp: 18  Temp: 98.3 F (36.8 C)  SpO2: 100%  Weight: 145 lb (65.8 kg)  Height: 6' (1.829 m)   Body mass index is 19.67 kg/m. Physical Exam Vitals and nursing note reviewed.  Constitutional:      Appearance: Normal appearance.  HENT:     Head: Normocephalic and atraumatic.     Mouth/Throat:     Mouth: Mucous membranes are moist.  Eyes:     Extraocular Movements: Extraocular movements intact.     Conjunctiva/sclera: Conjunctivae  normal.     Pupils: Pupils are equal, round, and reactive to light.  Cardiovascular:     Rate and Rhythm: Normal rate and regular rhythm.     Heart sounds: No murmur heard. Pulmonary:     Effort: Pulmonary effort is normal.     Breath sounds: No wheezing.  Abdominal:     General: Bowel sounds are normal.     Palpations: Abdomen is soft.     Tenderness: There is no abdominal tenderness.  Musculoskeletal:     Cervical back: Normal range of motion and neck supple.     Right lower leg: No edema.     Left lower leg: No edema.  Skin:    General: Skin is warm and dry.  Neurological:     General: No focal deficit present.     Mental Status: He is alert. Mental status is at baseline.     Motor: No weakness.     Gait: Gait abnormal.     Comments: Oriented to person, place  Psychiatric:        Mood and Affect: Mood normal.    Labs reviewed: Recent Labs    02/13/21 0509 04/13/21 0000 06/12/2021 2140  NA 140 144 140  K 4.6 4.3 4.4  CL 107 106 106  CO2 28 26* 25  GLUCOSE 100*  --  109*  BUN 30* 30* 30*  CREATININE 1.59* 1.6* 1.52*  CALCIUM 9.0 9.6 8.6*   Recent Labs    12/23/20 0000 04/13/21 0000 05/13/2021 2140  AST 13* 25 24  ALT 11 25 24   ALKPHOS 69 92 76  BILITOT  --   --  0.6  PROT  --   --  5.7*  ALBUMIN 3.8 4.6 3.4*   Recent Labs    12/23/20 0000 02/13/21 0509 04/13/21 0000 06/09/2021 2140  WBC 6.9 7.8 8.6 8.4  NEUTROABS 5,396.00 6.2  --  6.7  HGB 8.9* 9.0* 11.3* 9.6*  HCT 28* 28.6* 35* 30.6*  MCV  --  97.3  --  97.1  PLT 100* 133* 151 122*   Lab Results  Component Value Date   TSH 3.715 03/12/2020   Lab Results  Component Value Date   HGBA1C 5.1 09/10/2019   Lab Results  Component Value Date   CHOL 114 04/13/2021   HDL 37 04/13/2021   LDLCALC 59 04/13/2021   TRIG 101 04/13/2021   CHOLHDL 4.2 02/26/2020    Significant Diagnostic Results in last 30 days:  No results found.  Assessment/Plan Iron deficiency anemia had Colon and EGD, takes  Iron, Hgb 9.6 06/04/2021 at his baseline.   NSTEMI (non-ST elevated myocardial infarction) [facility]) NSTEMI stent f/u cardiology. Hospital stay 03/11/20-03/16/20 for NSTEMI, troponin 300s at ED, Cath showed LAD stenosis, underwent drug eluting stent, EF 55-60%, takes Atorvastatin, ASA,, Added Isosorbide 15mg  since last chest pain 05/01/20. 02/13/21 ED for chest pain, f/u Cardiology 04/06/21, took him off Plavix.   Essential hypertension, benign Runs low, off Metoprolol, takes Isosorbide.   GERD without esophagitis Stable, , takes Pantoprazole  CKD (chronic kidney disease) stage 3, GFR 30-59 ml/min (HCC) Bun/creat 30/1.52 05/19/2021  Alzheimer's dementia without behavioral disturbance ([place]) didn't tolerate memory meds, underwent neurology eval in the past. TSH 3.97 07/08/21  Hereditary and idiopathic peripheral neuropathy Stable, takes Gabapentin  Depression, recurrent (HCC) His mood is stable,  takes Sertraline  Gait abnormality Ambulates with walker, risk of falling is high.      Family/ staff Communication: plan of care reviewed with the patient and charge nurse.   Labs/tests ordered:  none  Time spend 40 minutes.

## 2021-07-09 NOTE — Assessment & Plan Note (Signed)
NSTEMI stent f/u cardiology. Hospital stay 03/11/20-03/16/20 for NSTEMI, troponin 300s at ED, Cath showed LAD stenosis, underwent drug eluting stent, EF 55-60%, takes Atorvastatin, ASA,, Added Isosorbide 15mg  since last chest pain 05/01/20. 02/13/21 ED for chest pain, f/u Cardiology 04/06/21, took him off Plavix.

## 2021-07-09 NOTE — Assessment & Plan Note (Signed)
His mood is stable,  takes Sertraline

## 2021-07-09 NOTE — Assessment & Plan Note (Signed)
Stable, takes Gabapentin

## 2021-07-09 NOTE — Assessment & Plan Note (Signed)
Bun/creat 30/1.52 06/05/2021

## 2021-07-09 NOTE — Assessment & Plan Note (Signed)
Runs low, off Metoprolol, takes Isosorbide.

## 2021-07-09 NOTE — Assessment & Plan Note (Signed)
didn't tolerate memory meds, underwent neurology eval in the past. TSH 3.97 07/08/21 

## 2021-07-12 DIAGNOSIS — R2689 Other abnormalities of gait and mobility: Secondary | ICD-10-CM | POA: Diagnosis not present

## 2021-07-12 DIAGNOSIS — M6281 Muscle weakness (generalized): Secondary | ICD-10-CM | POA: Diagnosis not present

## 2021-07-12 DIAGNOSIS — R296 Repeated falls: Secondary | ICD-10-CM | POA: Diagnosis not present

## 2021-07-12 DIAGNOSIS — Z9181 History of falling: Secondary | ICD-10-CM | POA: Diagnosis not present

## 2021-07-14 DIAGNOSIS — M6281 Muscle weakness (generalized): Secondary | ICD-10-CM | POA: Diagnosis not present

## 2021-07-14 DIAGNOSIS — R296 Repeated falls: Secondary | ICD-10-CM | POA: Diagnosis not present

## 2021-07-14 DIAGNOSIS — R2689 Other abnormalities of gait and mobility: Secondary | ICD-10-CM | POA: Diagnosis not present

## 2021-07-14 DIAGNOSIS — I10 Essential (primary) hypertension: Secondary | ICD-10-CM | POA: Diagnosis not present

## 2021-07-14 DIAGNOSIS — M5459 Other low back pain: Secondary | ICD-10-CM | POA: Diagnosis not present

## 2021-07-17 DIAGNOSIS — R2689 Other abnormalities of gait and mobility: Secondary | ICD-10-CM | POA: Diagnosis not present

## 2021-07-17 DIAGNOSIS — M6281 Muscle weakness (generalized): Secondary | ICD-10-CM | POA: Diagnosis not present

## 2021-07-17 DIAGNOSIS — M5459 Other low back pain: Secondary | ICD-10-CM | POA: Diagnosis not present

## 2021-07-17 DIAGNOSIS — I10 Essential (primary) hypertension: Secondary | ICD-10-CM | POA: Diagnosis not present

## 2021-07-17 DIAGNOSIS — R296 Repeated falls: Secondary | ICD-10-CM | POA: Diagnosis not present

## 2021-07-19 DIAGNOSIS — I10 Essential (primary) hypertension: Secondary | ICD-10-CM | POA: Diagnosis not present

## 2021-07-19 DIAGNOSIS — M6281 Muscle weakness (generalized): Secondary | ICD-10-CM | POA: Diagnosis not present

## 2021-07-19 DIAGNOSIS — R296 Repeated falls: Secondary | ICD-10-CM | POA: Diagnosis not present

## 2021-07-19 DIAGNOSIS — M5459 Other low back pain: Secondary | ICD-10-CM | POA: Diagnosis not present

## 2021-07-19 DIAGNOSIS — R2689 Other abnormalities of gait and mobility: Secondary | ICD-10-CM | POA: Diagnosis not present

## 2021-07-20 DIAGNOSIS — M5459 Other low back pain: Secondary | ICD-10-CM | POA: Diagnosis not present

## 2021-07-20 DIAGNOSIS — M6281 Muscle weakness (generalized): Secondary | ICD-10-CM | POA: Diagnosis not present

## 2021-07-20 DIAGNOSIS — R296 Repeated falls: Secondary | ICD-10-CM | POA: Diagnosis not present

## 2021-07-20 DIAGNOSIS — R2689 Other abnormalities of gait and mobility: Secondary | ICD-10-CM | POA: Diagnosis not present

## 2021-07-20 DIAGNOSIS — I10 Essential (primary) hypertension: Secondary | ICD-10-CM | POA: Diagnosis not present

## 2021-07-21 ENCOUNTER — Non-Acute Institutional Stay: Payer: Medicare Other | Admitting: Orthopedic Surgery

## 2021-07-21 ENCOUNTER — Encounter: Payer: Self-pay | Admitting: Orthopedic Surgery

## 2021-07-21 DIAGNOSIS — I10 Essential (primary) hypertension: Secondary | ICD-10-CM

## 2021-07-21 DIAGNOSIS — F0280 Dementia in other diseases classified elsewhere without behavioral disturbance: Secondary | ICD-10-CM | POA: Diagnosis not present

## 2021-07-21 DIAGNOSIS — G309 Alzheimer's disease, unspecified: Secondary | ICD-10-CM | POA: Diagnosis not present

## 2021-07-21 DIAGNOSIS — D500 Iron deficiency anemia secondary to blood loss (chronic): Secondary | ICD-10-CM

## 2021-07-21 DIAGNOSIS — I214 Non-ST elevation (NSTEMI) myocardial infarction: Secondary | ICD-10-CM

## 2021-07-21 DIAGNOSIS — M25551 Pain in right hip: Secondary | ICD-10-CM | POA: Diagnosis not present

## 2021-07-21 DIAGNOSIS — I25119 Atherosclerotic heart disease of native coronary artery with unspecified angina pectoris: Secondary | ICD-10-CM

## 2021-07-21 NOTE — Progress Notes (Signed)
Location:  [facility] Room Number: 907/A Place of Service:  ALF [phone]) Provider:  Yvonna Alanis, NP  Patient Care Team: Virgie Dad, MD as PCP - General (Internal Medicine) Elouise Munroe, MD as PCP - Cardiology (Cardiology)  Extended Emergency Contact Information Primary Emergency Contact: Weeks,[name] Address: [address]          [place], [place] [address] [name] Phone: [phone] Mobile Phone: [phone] Relation: Daughter Secondary Emergency Contact: [name], [place] of [place] Mobile Phone: [phone] Relation: Daughter  Code Status:  Full Code Goals of care: Advanced Directive information Advanced Directives 07/21/2021  Does Patient Have a Medical Advance Directive? Yes  Type of Paramedic of [place];Living will  Does patient want to make changes to medical advance directive? No - Patient declined  Copy of [place] in Chart? Yes - validated most recent copy scanned in chart (See row information)  Would patient like information on creating a medical advance directive? -     Chief Complaint  Patient presents with   Acute Visit    Acute visit for back pain.    HPI:  Pt is a 88 y.o. male seen today for an acute visit for low back pain.   02/08 he was observed on the bathroom floor by nursing staff. He told nurse he hit his right shoulder, but denied pain. He reported low back pain later on to nursing staff. He does not recall fall earlier this morning. He denies low back pain during our encounter. During exam, tenderness noted over right buttocks, no bruising or sign of injury. He is able with walk without increased pain. Ambulates with walker. He is given scheduled tylenol and gabapentin for pain.   Alzheimer's- no recent behavioral outbursts, continues to have poor safety awareness, ambulating with walker, MRI brain 2020 unchanged   generalized volume loss and chronic microvascular ischemia, unable to tolerate medication in past CAD/NSTEMI- followed by cardiology, NSTEMI 03/11/20- 03/16/20- stent placed, EF 55-60%, ED visit 02/2021 for chest pain- Plavix d/c by cardiology at f/u, remains on statin, aspirin, and Imdur HTN- BUN/creat 30/1.52 06/04/2021, metoprolol discontinued earlier this year due to hypotension Anemia- hgb 9.6 06/02/2021, remains on iron  Past Medical History:  Diagnosis Date   Anemia    Arthritis    arthritis,osteopenia,"spinal stenosis"   Bladder neck obstruction 03/28/2011   CAD (coronary artery disease)    Cancer ([facility]) 12-06-12   Prostate cancer'98   CKD (chronic kidney disease) stage 3, GFR 30-59 ml/min ([facility]) 08/17/2017   Depression    Essential hypertension, benign 04/10/2009   GERD (gastroesophageal reflux disease)    GERD without esophagitis 11/05/2016   H/O hiatal hernia    H/O vertigo 12-06-12   none recent   Hyperlipidemia    IBS (irritable bowel syndrome)    Mixed hyperlipidemia 04/10/2009   Neuropathy    Osteopenia    Peripheral neuropathy 12-06-12   peripheral neuropathy   Rhinitis    RLS (restless legs syndrome)    Vascular dementia ([facility]) 09/30/2019   Vitamin B12 deficiency    Past Surgical History:  Procedure Laterality Date   APPENDECTOMY     CARPAL TUNNEL RELEASE     both hands   cataract surgery Left 12-06-12   recent surgery   CHOLECYSTECTOMY     COLONOSCOPY WITH PROPOFOL N/A 12/25/2012   Procedure: COLONOSCOPY WITH PROPOFOL;  Surgeon: Garlan Fair, MD;  Location: [facility];  Service: Endoscopy;  Laterality: N/A;   CORONARY STENT INTERVENTION N/A 03/13/2020   Procedure: CORONARY STENT INTERVENTION;  Surgeon: Nelva Bush, MD;  Location: [facility];  Service: Cardiovascular;  Laterality: N/A;   ESOPHAGOGASTRODUODENOSCOPY (EGD) WITH PROPOFOL N/A 12/25/2012   Procedure: ESOPHAGOGASTRODUODENOSCOPY (EGD) WITH PROPOFOL;  Surgeon: Garlan Fair, MD;   [facility];  Service: Endoscopy;  Laterality: N/A;   INTRAVASCULAR ULTRASOUND/IVUS N/A 03/13/2020   Procedure: Intravascular Ultrasound/IVUS;  Surgeon: Nelva Bush, MD;  Location: [facility];  Service: Cardiovascular;  Laterality: N/A;   LEFT HEART CATH AND CORONARY ANGIOGRAPHY N/A 03/13/2020   Procedure: LEFT HEART CATH AND CORONARY ANGIOGRAPHY;  Surgeon: Nelva Bush, MD;  Location: [facility];  Service: Cardiovascular;  Laterality: N/A;   PROSTATE SURGERY  12-06-12   TONSILLECTOMY     TOTAL KNEE ARTHROPLASTY Right 11/25/2014   Procedure: RIGHT TOTAL KNEE ARTHROPLASTY;  Surgeon: Melrose Nakayama, MD;  Location: [facility];  Service: Orthopedics;  Laterality: Right;    Allergies  Allergen Reactions   Niacin Itching   Penicillins Other (See Comments)    "rash"    Tramadol Itching    Outpatient Encounter Medications as of 07/21/2021  Medication Sig   acetaminophen (TYLENOL) 500 MG tablet Take 1,000 mg by mouth in the morning and at bedtime.   aspirin EC 81 MG tablet Take 81 mg by mouth daily. Swallow whole.   atorvastatin (LIPITOR) 80 MG tablet Take 1 tablet (80 mg total) by mouth daily.   Cholecalciferol (VITAMIN D) 50 MCG (2000 UT) tablet Take 1 pill daily.   gabapentin (NEURONTIN) 100 MG capsule Take 200 mg by mouth 2 (two) times daily.   Iron, Ferrous Sulfate, 325 (65 Fe) MG TABS Take 1 tablet by mouth. **PLEASE GIVE THIS MED WITH FOOD** Mon, Wed, and Fri   isosorbide mononitrate (IMDUR) 30 MG 24 hr tablet Take 30 mg by mouth daily. Take 1/2 tab =15 mg, oral, Once A Day, **HOLD IF SBP IS LESS THAN 100**   lactose free nutrition (BOOST) LIQD Take 237 mLs by mouth daily.   loperamide (IMODIUM A-D) 2 MG tablet Take 2 mg by mouth 4 (four) times daily as needed for diarrhea or loose stools.   nitroGLYCERIN (NITROSTAT) 0.4 MG SL tablet Place 0.4 mg under the tongue every 5 (five) minutes as needed for chest pain. X 3   pantoprazole (PROTONIX) 40 MG tablet Take 40  mg by mouth 2 (two) times daily.   sertraline (ZOLOFT) 100 MG tablet 1 tablet Daily for Mood   sertraline (ZOLOFT) 25 MG tablet Take 25 mg by mouth daily. Take with 100 mg tab to total 125 mg daily.   No facility-administered encounter medications on file as of 07/21/2021.    Review of Systems  Unable to perform ROS: Dementia   Immunization History  Administered Date(s) Administered   Influenza, High Dose Seasonal PF 03/05/2015, 03/05/2018   Influenza-Unspecified 04/22/2016, 03/13/2017, 06/13/2018, 03/28/2019, 04/01/2021   Moderna Sars-Covid-2 Vaccination 06/17/2019, 07/15/2019, 04/21/2020   Pneumococcal Conjugate-13 05/23/2017   Pneumococcal Polysaccharide-23 01/20/2016   Tdap 06/21/2021   Unspecified SARS-COV-2 Vaccination 11/10/2020, 03/02/2021   Zoster Recombinat (Shingrix) 06/16/2021   Pertinent  Health Maintenance Due  Topic Date Due   INFLUENZA VACCINE  Completed   Fall Risk 03/16/2020 03/16/2020 05/01/2020 05/02/2020 02/13/2021  Falls in the past year? - - - - -  Was there an injury with Fall? - - - - -  Fall Risk Category Calculator - - - - -  Fall Risk Category - - - - -  Patient Fall Risk Level Moderate fall risk Moderate fall risk High fall risk Moderate fall risk Low fall risk  Patient at Risk for Falls Due to - - - - -  Fall risk Follow up - - - - -   Functional Status Survey:    Vitals:   07/21/21 1445  BP: 118/64  Pulse: 91  Resp: 20  Temp: 99 F (37.2 C)  SpO2: 99%  Weight: 145 lb (65.8 kg)  Height: 6' (1.829 m)   Body mass index is 19.67 kg/m. Physical Exam Vitals reviewed.  Constitutional:      General: He is not in acute distress. HENT:     Head: Normocephalic and atraumatic.  Eyes:     General:        Right eye: No discharge.        Left eye: No discharge.  Cardiovascular:     Rate and Rhythm: Normal rate and regular rhythm.     Pulses: Normal pulses.     Heart sounds: Murmur heard.  Pulmonary:     Effort: Pulmonary effort is normal. No  respiratory distress.     Breath sounds: Normal breath sounds. No wheezing.  Abdominal:     General: Bowel sounds are normal. There is no distension.     Palpations: Abdomen is soft.     Tenderness: There is no abdominal tenderness.  Musculoskeletal:     Right shoulder: Normal. No swelling, deformity or tenderness. Normal range of motion.     Cervical back: Normal and neck supple.     Thoracic back: Normal.     Lumbar back: Normal.     Right hip: Tenderness present. No deformity. Normal range of motion.     Left hip: Normal.     Right lower leg: No edema.     Left lower leg: No edema.     Comments: Tenderness to right sciatic/piriformis region and right lateral hip, no bruising or deformity noted, leg lengths even, able to bear weight and ambulate  Skin:    General: Skin is warm and dry.     Capillary Refill: Capillary refill takes less than 2 seconds.  Neurological:     General: No focal deficit present.     Mental Status: He is alert. Mental status is at baseline.     Motor: Weakness present.     Gait: Gait abnormal.     Comments: walker  Psychiatric:        Mood and Affect: Mood normal.        Behavior: Behavior normal.        Cognition and Memory: Memory is impaired.    Labs reviewed: Recent Labs    02/13/21 0509 04/13/21 0000 06/06/2021 2140  NA 140 144 140  K 4.6 4.3 4.4  CL 107 106 106  CO2 28 26* 25  GLUCOSE 100*  --  109*  BUN 30* 30* 30*  CREATININE 1.59* 1.6* 1.52*  CALCIUM 9.0 9.6 8.6*   Recent Labs    12/23/20 0000 04/13/21 0000 05/26/2021 2140  AST 13* 25 24  ALT 11 25 24   ALKPHOS 69 92 76  BILITOT  --   --  0.6  PROT  --   --  5.7*  ALBUMIN 3.8 4.6 3.4*   Recent Labs    12/23/20 0000 02/13/21 0509 04/13/21 0000 06/01/2021 2140  WBC 6.9 7.8 8.6 8.4  NEUTROABS 5,396.00 6.2  --  6.7  HGB 8.9* 9.0* 11.3* 9.6*  HCT 28* 28.6* 35* 30.6*  MCV  --  97.3  --  97.1  PLT 100* 133* 151 122*   Lab Results  Component Value Date   TSH 3.715 03/12/2020    Lab Results  Component Value Date   HGBA1C 5.1 09/10/2019   Lab Results  Component Value Date   CHOL 114 04/13/2021   HDL 37 04/13/2021   LDLCALC 59 04/13/2021   TRIG 101 04/13/2021   CHOLHDL 4.2 02/26/2020    Significant Diagnostic Results in last 30 days:  No results found.  Assessment/Plan 1. Pelvic joint pain, right - tenderness over right sciatic and right lateral hip, otherwise exam unremarkable - cont tylenol and gabapentin - recommend xray of pelvis 2-3 views, if pain worsens  2. Alzheimer's dementia without behavioral disturbance ([place]) - no behavioral outbursts - poor safety awareness - cont assisted living  3. Coronary artery disease involving native coronary artery of native heart with angina pectoris ([place]) - cont asa and statin  4. NSTEMI (non-ST elevated myocardial infarction) [facility]) - followed by cardiology - hospital stay 03/11/20-03/16/20- NSTEMI - LVEF 55-60% - cont asa, statin and imdur - off plavix 04/06/2021- per cardiology  5. Essential hypertension, benign - controlled without medication  6. Iron deficiency anemia due to chronic blood loss - hgb stable  - cont ferrous sulfate    Family/ staff Communication: plan discussed with patient and nurse  Labs/tests ordered:  none

## 2021-07-22 ENCOUNTER — Telehealth: Payer: Self-pay | Admitting: *Deleted

## 2021-07-22 DIAGNOSIS — M533 Sacrococcygeal disorders, not elsewhere classified: Secondary | ICD-10-CM | POA: Diagnosis not present

## 2021-07-22 DIAGNOSIS — R102 Pelvic and perineal pain: Secondary | ICD-10-CM | POA: Diagnosis not present

## 2021-07-22 DIAGNOSIS — M6281 Muscle weakness (generalized): Secondary | ICD-10-CM | POA: Diagnosis not present

## 2021-07-22 DIAGNOSIS — R296 Repeated falls: Secondary | ICD-10-CM | POA: Diagnosis not present

## 2021-07-22 DIAGNOSIS — R2689 Other abnormalities of gait and mobility: Secondary | ICD-10-CM | POA: Diagnosis not present

## 2021-07-22 DIAGNOSIS — W19XXXA Unspecified fall, initial encounter: Secondary | ICD-10-CM | POA: Diagnosis not present

## 2021-07-22 DIAGNOSIS — M5459 Other low back pain: Secondary | ICD-10-CM | POA: Diagnosis not present

## 2021-07-22 DIAGNOSIS — I10 Essential (primary) hypertension: Secondary | ICD-10-CM | POA: Diagnosis not present

## 2021-07-22 NOTE — Telephone Encounter (Signed)
Please let Manxie know. She is on campus at [facility]. I left orders to obtain pelvic xray if he continues to complain of pelivic/ hip pain.

## 2021-07-22 NOTE — Telephone Encounter (Signed)
Elizabeth with [facility] called and stated that patient was seen for a fall yesterday and fell again today. No Injury, did not hit head. Alert. States he feels weak.   Vitals: BP 128/56, SpO2: 94%, Pulse 92 RR 18  Nurse was just calling to make you aware of incident.  FYI

## 2021-07-23 ENCOUNTER — Non-Acute Institutional Stay: Payer: Medicare Other | Admitting: Nurse Practitioner

## 2021-07-23 ENCOUNTER — Encounter: Payer: Self-pay | Admitting: Nurse Practitioner

## 2021-07-23 DIAGNOSIS — G309 Alzheimer's disease, unspecified: Secondary | ICD-10-CM | POA: Diagnosis not present

## 2021-07-23 DIAGNOSIS — G609 Hereditary and idiopathic neuropathy, unspecified: Secondary | ICD-10-CM | POA: Diagnosis not present

## 2021-07-23 DIAGNOSIS — I10 Essential (primary) hypertension: Secondary | ICD-10-CM | POA: Diagnosis not present

## 2021-07-23 DIAGNOSIS — F339 Major depressive disorder, recurrent, unspecified: Secondary | ICD-10-CM | POA: Diagnosis not present

## 2021-07-23 DIAGNOSIS — R296 Repeated falls: Secondary | ICD-10-CM

## 2021-07-23 DIAGNOSIS — K219 Gastro-esophageal reflux disease without esophagitis: Secondary | ICD-10-CM | POA: Diagnosis not present

## 2021-07-23 DIAGNOSIS — F0280 Dementia in other diseases classified elsewhere without behavioral disturbance: Secondary | ICD-10-CM

## 2021-07-23 DIAGNOSIS — R269 Unspecified abnormalities of gait and mobility: Secondary | ICD-10-CM | POA: Diagnosis not present

## 2021-07-23 DIAGNOSIS — I2510 Atherosclerotic heart disease of native coronary artery without angina pectoris: Secondary | ICD-10-CM

## 2021-07-23 DIAGNOSIS — D500 Iron deficiency anemia secondary to blood loss (chronic): Secondary | ICD-10-CM

## 2021-07-23 DIAGNOSIS — N1832 Chronic kidney disease, stage 3b: Secondary | ICD-10-CM | POA: Diagnosis not present

## 2021-07-23 DIAGNOSIS — W19XXXA Unspecified fall, initial encounter: Secondary | ICD-10-CM | POA: Diagnosis not present

## 2021-07-23 LAB — HEPATIC FUNCTION PANEL
ALT: 30 (ref 10–40)
AST: 31 (ref 14–40)
Alkaline Phosphatase: 84 (ref 25–125)
Bilirubin, Total: 0.9

## 2021-07-23 LAB — CBC AND DIFFERENTIAL
HCT: 32 — AB (ref 41–53)
Hemoglobin: 10.6 — AB (ref 13.5–17.5)
Neutrophils Absolute: 8066
Platelets: 108 — AB (ref 150–399)
WBC: 9.5

## 2021-07-23 LAB — COMPREHENSIVE METABOLIC PANEL
Albumin: 4.3 (ref 3.5–5.0)
Calcium: 8.9 (ref 8.7–10.7)
Globulin: 2.2

## 2021-07-23 LAB — BASIC METABOLIC PANEL
BUN: 31 — AB (ref 4–21)
CO2: 27 — AB (ref 13–22)
Chloride: 105 (ref 99–108)
Creatinine: 1.6 — AB (ref 0.6–1.3)
Glucose: 103
Potassium: 3.9 (ref 3.4–5.3)
Sodium: 141 (ref 137–147)

## 2021-07-23 LAB — CBC: RBC: 3.4 — AB (ref 3.87–5.11)

## 2021-07-23 NOTE — Progress Notes (Addendum)
Location:   [facility] Room Number: 907 Place of Service:  ALF (13) Provider: Lennie Odor Zaela Graley NP  Virgie Dad, MD  Patient Care Team: Virgie Dad, MD as PCP - General (Internal Medicine) Elouise Munroe, MD as PCP - Cardiology (Cardiology)  Extended Emergency Contact Information Primary Emergency Contact: Weeks,[name] Address: [address]          [place], [place] [address] [name] of [place] Phone: [phone] Mobile Phone: [phone] Relation: Daughter Secondary Emergency Contact: [name], [place] of [place] Mobile Phone: [phone] Relation: Daughter  Code Status: DNR Goals of care: Advanced Directive information Advanced Directives 07/23/2021  Does Patient Have a Medical Advance Directive? Yes  Type of Paramedic of [place];Living will  Does patient want to make changes to medical advance directive? No - Patient declined  Copy of [place] in Chart? Yes - validated most recent copy scanned in chart (See row information)  Would patient like information on creating a medical advance directive? -     Chief Complaint  Patient presents with   Acute Visit    Fall    HPI:  Pt is a 88 y.o. male seen today for an acute visit for falls x3 in the past week, sacral coccyx region bruise noted, c/o buttocks pain, able to ambulate with walker as usual. The patient has difficulty manipulate his walker on the plush area rug during my examination today.    Hx of fall with injury of R ribs fx 3rd, 4th, 7th, healed.              Anemia, had Colon and EGD, takes Iron, Hgb 9.6 06/12/2021             CAD, NSTEMI stent f/u cardiology. Hospital stay 03/11/20-03/16/20 for NSTEMI, troponin 300s at ED, Cath showed LAD stenosis, underwent drug eluting stent, EF 55-60%, takes Atorvastatin, ASA,, Added Isosorbide 61m since last chest pain 05/01/20. 02/13/21 ED for chest pain, f/u Cardiology  04/06/21, took him off Plavix.              HTN, controlled, off Metoprolol.             GERD, takes Pantoprazole             AI, EF 55-60%             CKD stage 3, Bun/creat 30/1.52 05/24/2021 Alzheimer's dementia, didn't tolerate memory meds, underwent neurology eval in the past. TSH 3.97 07/08/21             Peripheral neuropathy, takes Gabapentin             Depression takes Sertraline             Gait abnormality, risk of falling is high.   Past Medical History:  Diagnosis Date   Anemia    Arthritis    arthritis,osteopenia,"spinal stenosis"   Bladder neck obstruction 03/28/2011   CAD (coronary artery disease)    [place] 12-06-12   Prostate cancer'98   CKD (chronic kidney disease) stage 3, GFR 30-59 ml/min ([facility] 08/17/2017   Depression    Essential hypertension, benign 04/10/2009   GERD (gastroesophageal reflux disease)    GERD without esophagitis 11/05/2016   H/O hiatal hernia    H/O vertigo 12-06-12   none recent   Hyperlipidemia    IBS (irritable bowel syndrome)    Mixed hyperlipidemia  04/10/2009   Neuropathy    Osteopenia    Peripheral neuropathy 12-06-12   peripheral neuropathy   Rhinitis    RLS (restless legs syndrome)    Vascular dementia ([facility]) 09/30/2019   Vitamin B12 deficiency    Past Surgical History:  Procedure Laterality Date   APPENDECTOMY     CARPAL TUNNEL RELEASE     both hands   cataract surgery Left 12-06-12   recent surgery   CHOLECYSTECTOMY     COLONOSCOPY WITH PROPOFOL N/A 12/25/2012   Procedure: COLONOSCOPY WITH PROPOFOL;  Surgeon: Garlan Fair, MD;  Location: [facility];  Service: Endoscopy;  Laterality: N/A;   CORONARY STENT INTERVENTION N/A 03/13/2020   Procedure: CORONARY STENT INTERVENTION;  Surgeon: Nelva Bush, MD;  Location: [facility];  Service: Cardiovascular;  Laterality: N/A;   ESOPHAGOGASTRODUODENOSCOPY (EGD) WITH PROPOFOL N/A 12/25/2012   Procedure: ESOPHAGOGASTRODUODENOSCOPY (EGD) WITH PROPOFOL;  Surgeon: Garlan Fair, MD;  Location: [facility];  Service: Endoscopy;  Laterality: N/A;   INTRAVASCULAR ULTRASOUND/IVUS N/A 03/13/2020   Procedure: Intravascular Ultrasound/IVUS;  Surgeon: Nelva Bush, MD;  Location: [facility];  Service: Cardiovascular;  Laterality: N/A;   LEFT HEART CATH AND CORONARY ANGIOGRAPHY N/A 03/13/2020   Procedure: LEFT HEART CATH AND CORONARY ANGIOGRAPHY;  Surgeon: Nelva Bush, MD;  Location: [facility];  Service: Cardiovascular;  Laterality: N/A;   PROSTATE SURGERY  12-06-12   TONSILLECTOMY     TOTAL KNEE ARTHROPLASTY Right 11/25/2014   Procedure: RIGHT TOTAL KNEE ARTHROPLASTY;  Surgeon: Melrose Nakayama, MD;  Location: [facility];  Service: Orthopedics;  Laterality: Right;    Allergies  Allergen Reactions   Niacin Itching   Penicillins Other (See Comments)    "rash"    Tramadol Itching    Allergies as of 07/23/2021       Reactions   Niacin Itching   Penicillins Other (See Comments)   "rash"   Tramadol Itching        Medication List        Accurate as of July 23, 2021 11:59 PM. If you have any questions, ask your nurse or doctor.          STOP taking these medications    loperamide 2 MG tablet Commonly known as: IMODIUM A-D Stopped by: Vonette Grosso X Tascha Casares, NP       TAKE these medications    acetaminophen 500 MG tablet Commonly known as: TYLENOL Take 1,000 mg by mouth in the morning and at bedtime.   aspirin EC 81 MG tablet Take 81 mg by mouth daily. Swallow whole.   atorvastatin 80 MG tablet Commonly known as: LIPITOR Take 1 tablet (80 mg total) by mouth daily.   gabapentin 100 MG capsule Commonly known as: NEURONTIN Take 200 mg by mouth 2 (two) times daily.   Iron (Ferrous Sulfate) 325 (65 Fe) MG Tabs Take 1 tablet by mouth. **PLEASE GIVE THIS MED WITH FOOD** Mon, Wed, and Fri   isosorbide mononitrate 30 MG 24 hr tablet Commonly known as: IMDUR Take 30 mg by mouth daily. Take 1/2 tab =15 mg, oral, Once A Day, **HOLD  IF SBP IS LESS THAN 100**   lactose free nutrition Liqd Take 237 mLs by mouth daily.   nitroGLYCERIN 0.4 MG SL tablet Commonly known as: NITROSTAT Place 0.4 mg under the tongue every 5 (five) minutes as needed for chest pain. X 3   pantoprazole 40 MG tablet Commonly known as: PROTONIX Take 40 mg by mouth 2 (two)  times daily.   sertraline 25 MG tablet Commonly known as: ZOLOFT Take 25 mg by mouth daily. Take with 100 mg tab to total 125 mg daily.   sertraline 100 MG tablet Commonly known as: ZOLOFT 1 tablet Daily for Mood   Vitamin D 50 MCG (2000 UT) tablet Take 1 pill daily.        Review of Systems  Unable to perform ROS: Dementia   Immunization History  Administered Date(s) Administered   Influenza, High Dose Seasonal PF 03/05/2015, 03/05/2018   Influenza-Unspecified 04/22/2016, 03/13/2017, 06/13/2018, 03/28/2019, 04/01/2021   Moderna Sars-Covid-2 Vaccination 06/17/2019, 07/15/2019, 04/21/2020   Pneumococcal Conjugate-13 05/23/2017   Pneumococcal Polysaccharide-23 01/20/2016   Tdap 06/21/2021   Unspecified SARS-COV-2 Vaccination 11/10/2020, 03/02/2021   Zoster Recombinat (Shingrix) 06/16/2021   Pertinent  Health Maintenance Due  Topic Date Due   INFLUENZA VACCINE  Completed   Fall Risk 03/16/2020 03/16/2020 05/01/2020 05/02/2020 02/13/2021  Falls in the past year? - - - - -  Was there an injury with Fall? - - - - -  Fall Risk Category Calculator - - - - -  Fall Risk Category - - - - -  Patient Fall Risk Level Moderate fall risk Moderate fall risk High fall risk Moderate fall risk Low fall risk  Patient at Risk for Falls Due to - - - - -  Fall risk Follow up - - - - -   Functional Status Survey:    Vitals:   07/23/21 1509  BP: 130/64  Pulse: 89  Resp: 18  Temp: 98 F (36.7 C)  SpO2: 94%  Weight: 145 lb (65.8 kg)  Height: 6' (1.829 m)   Body mass index is 19.67 kg/m. Physical Exam Vitals and nursing note reviewed.  Constitutional:      Appearance:  Normal appearance.  HENT:     Head: Normocephalic and atraumatic.     Mouth/Throat:     Mouth: Mucous membranes are moist.  Eyes:     Extraocular Movements: Extraocular movements intact.     Conjunctiva/sclera: Conjunctivae normal.     Pupils: Pupils are equal, round, and reactive to light.  Cardiovascular:     Rate and Rhythm: Normal rate and regular rhythm.     Heart sounds: No murmur heard. Pulmonary:     Effort: Pulmonary effort is normal.     Breath sounds: No wheezing.  Abdominal:     General: Bowel sounds are normal.     Palpations: Abdomen is soft.     Tenderness: There is no abdominal tenderness.  Musculoskeletal:     Cervical back: Normal range of motion and neck supple.     Right lower leg: No edema.     Left lower leg: No edema.     Comments: C/o in buttocks.   Skin:    General: Skin is warm and dry.     Findings: Bruising present.     Comments: Bruises in the sacral coccyx areas.   Neurological:     General: No focal deficit present.     Mental Status: He is alert. Mental status is at baseline.     Motor: No weakness.     Gait: Gait abnormal.     Comments: Oriented to person, place  Psychiatric:        Mood and Affect: Mood normal.    Labs reviewed: Recent Labs    02/13/21 0509 04/13/21 0000 06/10/2021 2140 07/23/21 0000  NA 140 144 140 141  K 4.6 4.3 4.4 3.9  CL 107 106 106 105  CO2 28 26* 25 27*  GLUCOSE 100*  --  109*  --   BUN 30* 30* 30* 31*  CREATININE 1.59* 1.6* 1.52* 1.6*  CALCIUM 9.0 9.6 8.6* 8.9   Recent Labs    04/13/21 0000 05/15/2021 2140 07/23/21 0000  AST _0 ALT _1 ALKPHOS 92 76 84  BILITOT  --  0.6  --   PROT  --  5.7*  --   ALBUMIN 4.6 3.4* 4.3   Recent Labs    02/13/21 0509 04/13/21 0000 06/01/2021 2140 07/23/21 0000  WBC 7.8 8.6 8.4 9.5  NEUTROABS 6.2  --  6.7 8,066.00  HGB 9.0* 11.3* 9.6* 10.6*  HCT 28.6* 35* 30.6* 32*  MCV 97.3  --  97.1  --   PLT 133* 151 122* 108*   Lab Results  Component Value  Date   TSH 3.97 07/08/2021   Lab Results  Component Value Date   HGBA1C 5.1 09/10/2019   Lab Results  Component Value Date   CHOL 114 04/13/2021   HDL 37 04/13/2021   LDLCALC 59 04/13/2021   TRIG 101 04/13/2021   CHOLHDL 4.2 02/26/2020    Significant Diagnostic Results in last 30 days:  No results found.  Assessment/Plan: Frequent falls  falls x3 in the past a few days, sacral coccyx region bruise noted, c/o buttocks pain, able to ambulate with walker as usual. The patient has difficulty manipulate his walker on the plush area rug during my examination today.   X-ray 07/22/21 pelvis no acute osseous abnormality. Sacrum/coccyx no evidence of sacral or coccygeal vertebral body fx.   Hx of fall with injury of R ribs fx 3rd, 4th, 7th, healed.   Recommend to remove the area rug from his room, close supervision/assistance for safety, PT to eval. Update CBC/diff, CMP/eGFR.   Iron deficiency anemia had Colon and EGD, takes Iron, Hgb 9.6 05/24/2021 07/29/21 wbc 6.1, Hgb 8.1, plt 103.   CORONARY ATHEROSCLEROSIS NATIVE CORONARY ARTERY  NSTEMI stent f/u cardiology. Hospital stay 03/11/20-03/16/20 for NSTEMI, troponin 300s at ED, Cath showed LAD stenosis, underwent drug eluting stent, EF 55-60%, takes Atorvastatin, ASA,, Added Isosorbide 46m since last chest pain 05/01/20. 02/13/21 ED for chest pain, f/u Cardiology 04/06/21, took him off Plavix.   Essential hypertension, benign controlled, off Metoprolol.  GERD without esophagitis takes Pantoprazole  CKD (chronic kidney disease) stage 3, GFR 30-59 ml/min ([facility]) Bun/creat 30/1.52 05/15/2021 07/23/21 Na 141, K 3.9, Bun 31, creat 1.63, wbc 9.5, Hgb 10.6, plt 108, neutrophils 84.9, will get UA C/S 2/2 slightly left shift  neutrophils, observe for s/s of infection, encourage oral fluid intake, f/u CBC, BMP 07/29/21 Bun/creat 33/1.66  Alzheimer's dementia without behavioral disturbance ([facility]  didn't tolerate memory meds, underwent neurology eval in the  past. TSH 3.97 07/08/21  Hereditary and idiopathic peripheral neuropathy takes Gabapentin  Depression, recurrent ([facility] His mood is stable, continue Sertraline.   Gait abnormality Continue ambulating with walker, risk for fall is high 2/2 increased frailty, lack of safety awareness, ensure safety surroundings, provide close supervision/assistance.     Family/ staff Communication: plan of care reviewed with the patient and charge nurse.   Labs/tests ordered: CBC/diff, CMP/eGFR  Time spend 40 minutes

## 2021-07-23 NOTE — Assessment & Plan Note (Signed)
falls x3 in the past a few days, sacral coccyx region bruise noted, c/o buttocks pain, able to ambulate with walker as usual. The patient has difficulty manipulate his walker on the plush area rug during my examination today.   X-ray 07/22/21 pelvis no acute osseous abnormality. Sacrum/coccyx no evidence of sacral or coccygeal vertebral body fx.   Hx of fall with injury of R ribs fx 3rd, 4th, 7th, healed.   Recommend to remove the area rug from his room, close supervision/assistance for safety, PT to eval. Update CBC/diff, CMP/eGFR.

## 2021-07-23 NOTE — Assessment & Plan Note (Signed)
didn't tolerate memory meds, underwent neurology eval in the past. TSH 3.97 07/08/21 

## 2021-07-23 NOTE — Assessment & Plan Note (Addendum)
Bun/creat 30/1.52 05/31/2021 07/23/21 Na 141, K 3.9, Bun 31, creat 1.63, wbc 9.5, Hgb 10.6, plt 108, neutrophils 84.9, will get UA C/S 2/2 slightly left shift  neutrophils, observe for s/s of infection, encourage oral fluid intake, f/u CBC, BMP 07/29/21 Bun/creat 33/1.66

## 2021-07-23 NOTE — Telephone Encounter (Signed)
Forwarded to [facility].

## 2021-07-23 NOTE — Assessment & Plan Note (Signed)
Continue ambulating with walker, risk for fall is high 2/2 increased frailty, lack of safety awareness, ensure safety surroundings, provide close supervision/assistance.

## 2021-07-23 NOTE — Assessment & Plan Note (Signed)
His mood is stable, continue Sertraline.

## 2021-07-23 NOTE — Assessment & Plan Note (Signed)
takes Gabapentin  

## 2021-07-23 NOTE — Assessment & Plan Note (Signed)
takes Pantoprazole 

## 2021-07-23 NOTE — Assessment & Plan Note (Signed)
controlled, off Metoprolol.

## 2021-07-23 NOTE — Assessment & Plan Note (Signed)
NSTEMI stent f/u cardiology. Hospital stay 03/11/20-03/16/20 for NSTEMI, troponin 300s at ED, Cath showed LAD stenosis, underwent drug eluting stent, EF 55-60%, takes Atorvastatin, ASA,, Added Isosorbide 15mg  since last chest pain 05/01/20. 02/13/21 ED for chest pain, f/u Cardiology 04/06/21, took him off Plavix.

## 2021-07-23 NOTE — Assessment & Plan Note (Addendum)
had Colon and EGD, takes Iron, Hgb 9.6 06/04/2021 07/29/21 wbc 6.1, Hgb 8.1, plt 103.

## 2021-07-25 ENCOUNTER — Encounter: Payer: Self-pay | Admitting: Nurse Practitioner

## 2021-07-27 DIAGNOSIS — M6281 Muscle weakness (generalized): Secondary | ICD-10-CM | POA: Diagnosis not present

## 2021-07-27 DIAGNOSIS — R296 Repeated falls: Secondary | ICD-10-CM | POA: Diagnosis not present

## 2021-07-27 DIAGNOSIS — R2689 Other abnormalities of gait and mobility: Secondary | ICD-10-CM | POA: Diagnosis not present

## 2021-07-27 DIAGNOSIS — M5459 Other low back pain: Secondary | ICD-10-CM | POA: Diagnosis not present

## 2021-07-27 DIAGNOSIS — I10 Essential (primary) hypertension: Secondary | ICD-10-CM | POA: Diagnosis not present

## 2021-07-28 DIAGNOSIS — R2689 Other abnormalities of gait and mobility: Secondary | ICD-10-CM | POA: Diagnosis not present

## 2021-07-28 DIAGNOSIS — M5459 Other low back pain: Secondary | ICD-10-CM | POA: Diagnosis not present

## 2021-07-28 DIAGNOSIS — R296 Repeated falls: Secondary | ICD-10-CM | POA: Diagnosis not present

## 2021-07-28 DIAGNOSIS — I10 Essential (primary) hypertension: Secondary | ICD-10-CM | POA: Diagnosis not present

## 2021-07-28 DIAGNOSIS — M6281 Muscle weakness (generalized): Secondary | ICD-10-CM | POA: Diagnosis not present

## 2021-07-29 DIAGNOSIS — I10 Essential (primary) hypertension: Secondary | ICD-10-CM | POA: Diagnosis not present

## 2021-07-29 DIAGNOSIS — M5459 Other low back pain: Secondary | ICD-10-CM | POA: Diagnosis not present

## 2021-07-29 DIAGNOSIS — R296 Repeated falls: Secondary | ICD-10-CM | POA: Diagnosis not present

## 2021-07-29 DIAGNOSIS — E860 Dehydration: Secondary | ICD-10-CM | POA: Diagnosis not present

## 2021-07-29 DIAGNOSIS — M6281 Muscle weakness (generalized): Secondary | ICD-10-CM | POA: Diagnosis not present

## 2021-07-29 DIAGNOSIS — R2689 Other abnormalities of gait and mobility: Secondary | ICD-10-CM | POA: Diagnosis not present

## 2021-07-29 LAB — CBC: RBC: 2.63 — AB (ref 3.87–5.11)

## 2021-07-29 LAB — BASIC METABOLIC PANEL
BUN: 33 — AB (ref 4–21)
CO2: 26 — AB (ref 13–22)
Chloride: 109 — AB (ref 99–108)
Creatinine: 1.7 — AB (ref 0.6–1.3)
Glucose: 100
Potassium: 4.2 (ref 3.4–5.3)
Sodium: 146 (ref 137–147)

## 2021-07-29 LAB — CBC AND DIFFERENTIAL
HCT: 25 — AB (ref 41–53)
Hemoglobin: 8.1 — AB (ref 13.5–17.5)
Platelets: 103 — AB (ref 150–399)
WBC: 6.1

## 2021-07-29 LAB — COMPREHENSIVE METABOLIC PANEL: Calcium: 8.4 — AB (ref 8.7–10.7)

## 2021-07-30 ENCOUNTER — Encounter: Payer: Self-pay | Admitting: Nurse Practitioner

## 2021-07-30 ENCOUNTER — Non-Acute Institutional Stay: Payer: Medicare Other | Admitting: Nurse Practitioner

## 2021-07-30 DIAGNOSIS — R296 Repeated falls: Secondary | ICD-10-CM

## 2021-07-30 DIAGNOSIS — F339 Major depressive disorder, recurrent, unspecified: Secondary | ICD-10-CM

## 2021-07-30 DIAGNOSIS — D500 Iron deficiency anemia secondary to blood loss (chronic): Secondary | ICD-10-CM | POA: Diagnosis not present

## 2021-07-30 DIAGNOSIS — G309 Alzheimer's disease, unspecified: Secondary | ICD-10-CM | POA: Diagnosis not present

## 2021-07-30 DIAGNOSIS — M7918 Myalgia, other site: Secondary | ICD-10-CM

## 2021-07-30 DIAGNOSIS — F0280 Dementia in other diseases classified elsewhere without behavioral disturbance: Secondary | ICD-10-CM

## 2021-07-30 DIAGNOSIS — G609 Hereditary and idiopathic neuropathy, unspecified: Secondary | ICD-10-CM | POA: Diagnosis not present

## 2021-07-30 DIAGNOSIS — R269 Unspecified abnormalities of gait and mobility: Secondary | ICD-10-CM | POA: Diagnosis not present

## 2021-07-30 DIAGNOSIS — N1832 Chronic kidney disease, stage 3b: Secondary | ICD-10-CM

## 2021-07-30 DIAGNOSIS — K219 Gastro-esophageal reflux disease without esophagitis: Secondary | ICD-10-CM

## 2021-07-30 DIAGNOSIS — N390 Urinary tract infection, site not specified: Secondary | ICD-10-CM | POA: Diagnosis not present

## 2021-07-30 DIAGNOSIS — I10 Essential (primary) hypertension: Secondary | ICD-10-CM | POA: Diagnosis not present

## 2021-07-30 DIAGNOSIS — I2510 Atherosclerotic heart disease of native coronary artery without angina pectoris: Secondary | ICD-10-CM

## 2021-07-30 NOTE — Assessment & Plan Note (Signed)
07/29/21 wbc 6.1, Hgb 8.1, plt 103. Hold ASA, repeat CBC, Iron, Ferritin Vit B12. Obtain FOBT x3

## 2021-07-30 NOTE — Progress Notes (Signed)
Location:  [facility] Room Number: DT267/T Place of Service:  ALF [phone]) Provider:  Kaylee Wombles Otho Darner, NP  Patient Care Team: Virgie Dad, MD as PCP - General (Internal Medicine) Elouise Munroe, MD as PCP - Cardiology (Cardiology)  Extended Emergency Contact Information Primary Emergency Contact: Weeks,[name] Address: [address]          [place], [place] [address] [name] of [place] Phone: [phone] Mobile Phone: [phone] Relation: Daughter Secondary Emergency Contact: [name], [place] of [place] Mobile Phone: [phone] Relation: Daughter  Code Status:  Full code Goals of care: Advanced Directive information Advanced Directives 07/30/2021  Does Patient Have a Medical Advance Directive? Yes  Type of Paramedic of [place];Living will  Does patient want to make changes to medical advance directive? No - Patient declined  Copy of [place] in Chart? Yes - validated most recent copy scanned in chart (See row information)  Would patient like information on creating a medical advance directive? -     Chief Complaint  Patient presents with   Acute Visit    Anemia     HPI:  Pt is a 88 y.o. male seen today for an acute visit for lower Hgb 9.6 06/02/2021>>8.1 07/29/21, on Iron, FOBT positive x2 ? 2/2 taking Iron   Frequent falls x3 about 2 weeks ago, c/o R buttock pain, noted bruise and hematoma, Hx of fall with injury of R ribs fx 3rd, 4th, 7th, healed.  Recommend to remove the area rug from his room, close supervision/assistance for safety, PT to eval.             Anemia, had Colon and EGD, takes Iron, Hgb 9.6 05/13/2021>>8.1 07/29/21, on Iron, FOBT positive x2 ? 2/2 taking Iron             CAD, NSTEMI stent f/u cardiology. Hospital stay 03/11/20-03/16/20 for NSTEMI, troponin 300s at ED, Cath showed LAD stenosis, underwent drug eluting stent, EF 55-60%, takes  Atorvastatin, ASA,, Added Isosorbide 15mg  since last chest pain 05/01/20. 02/13/21 ED for chest pain, f/u Cardiology 04/06/21, took him off Plavix.              HTN, controlled, off Metoprolol.             GERD, takes Pantoprazole             AI, EF 55-60%             CKD stage 3, Bun/creat 33/1.66 07/29/21 Alzheimer's dementia, didn't tolerate memory meds, underwent neurology eval in the past. TSH 3.97 07/08/21             Peripheral neuropathy, takes Gabapentin             Depression takes Sertraline             Gait abnormality, risk of falling is high.  Past Medical History:  Diagnosis Date   Anemia    Arthritis    arthritis,osteopenia,"spinal stenosis"   Bladder neck obstruction 03/28/2011   CAD (coronary artery disease)    Cancer ([facility]) 12-06-12   Prostate cancer'98   CKD (chronic kidney disease) stage 3, GFR 30-59 ml/min ([facility]) 08/17/2017   Depression    Essential hypertension, benign 04/10/2009   GERD (gastroesophageal reflux disease)    GERD without esophagitis 11/05/2016   H/O hiatal hernia    H/O vertigo 12-06-12   none  recent   Hyperlipidemia    IBS (irritable bowel syndrome)    Mixed hyperlipidemia 04/10/2009   Neuropathy    Osteopenia    Peripheral neuropathy 12-06-12   peripheral neuropathy   Rhinitis    RLS (restless legs syndrome)    Vascular dementia ([facility]) 09/30/2019   Vitamin B12 deficiency    Past Surgical History:  Procedure Laterality Date   APPENDECTOMY     CARPAL TUNNEL RELEASE     both hands   cataract surgery Left 12-06-12   recent surgery   CHOLECYSTECTOMY     COLONOSCOPY WITH PROPOFOL N/A 12/25/2012   Procedure: COLONOSCOPY WITH PROPOFOL;  Surgeon: Garlan Fair, MD;  [facility];  Service: Endoscopy;  Laterality: N/A;   CORONARY STENT INTERVENTION N/A 03/13/2020   Procedure: CORONARY STENT INTERVENTION;  Surgeon: Nelva Bush, MD;  Location: [facility];  Service: Cardiovascular;  Laterality: N/A;   ESOPHAGOGASTRODUODENOSCOPY  (EGD) WITH PROPOFOL N/A 12/25/2012   Procedure: ESOPHAGOGASTRODUODENOSCOPY (EGD) WITH PROPOFOL;  Surgeon: Garlan Fair, MD;  [facility];  Service: Endoscopy;  Laterality: N/A;   INTRAVASCULAR ULTRASOUND/IVUS N/A 03/13/2020   Procedure: Intravascular Ultrasound/IVUS;  Surgeon: Nelva Bush, MD;  Location: [facility];  Service: Cardiovascular;  Laterality: N/A;   LEFT HEART CATH AND CORONARY ANGIOGRAPHY N/A 03/13/2020   Procedure: LEFT HEART CATH AND CORONARY ANGIOGRAPHY;  Surgeon: Nelva Bush, MD;  Location: [facility];  Service: Cardiovascular;  Laterality: N/A;   PROSTATE SURGERY  12-06-12   TONSILLECTOMY     TOTAL KNEE ARTHROPLASTY Right 11/25/2014   Procedure: RIGHT TOTAL KNEE ARTHROPLASTY;  Surgeon: Melrose Nakayama, MD;  Location: [facility];  Service: Orthopedics;  Laterality: Right;    Allergies  Allergen Reactions   Niacin Itching   Penicillins Other (See Comments)    "rash"    Tramadol Itching    Outpatient Encounter Medications as of 07/30/2021  Medication Sig   acetaminophen (TYLENOL) 500 MG tablet Take 1,000 mg by mouth in the morning and at bedtime.   aspirin EC 81 MG tablet Take 81 mg by mouth daily. Swallow whole.   atorvastatin (LIPITOR) 80 MG tablet Take 1 tablet (80 mg total) by mouth daily.   Cholecalciferol (VITAMIN D) 50 MCG (2000 UT) tablet Take 1 pill daily.   gabapentin (NEURONTIN) 100 MG capsule Take 200 mg by mouth 2 (two) times daily.   Iron, Ferrous Sulfate, 325 (65 Fe) MG TABS Take 1 tablet by mouth. **PLEASE GIVE THIS MED WITH FOOD** Mon, Wed, and Fri   isosorbide mononitrate (IMDUR) 30 MG 24 hr tablet Take 30 mg by mouth daily. Take 1/2 tab =15 mg, oral, Once A Day, **HOLD IF SBP IS LESS THAN 100**   lactose free nutrition (BOOST) LIQD Take 237 mLs by mouth daily.   nitroGLYCERIN (NITROSTAT) 0.4 MG SL tablet Place 0.4 mg under the tongue every 5 (five) minutes as needed for chest pain. X 3   pantoprazole (PROTONIX) 40 MG  tablet Take 40 mg by mouth 2 (two) times daily.   sertraline (ZOLOFT) 100 MG tablet 1 tablet Daily for Mood   sertraline (ZOLOFT) 25 MG tablet Take 25 mg by mouth daily. Take with 100 mg tab to total 125 mg daily.   No facility-administered encounter medications on file as of 07/30/2021.    Review of Systems  Unable to perform ROS: Dementia   Immunization History  Administered Date(s) Administered   Influenza, High Dose Seasonal PF 03/05/2015, 03/05/2018   Influenza-Unspecified 04/22/2016,  03/13/2017, 06/13/2018, 03/28/2019, 04/01/2021   Moderna Sars-Covid-2 Vaccination 06/17/2019, 07/15/2019, 04/21/2020   Pneumococcal Conjugate-13 05/23/2017   Pneumococcal Polysaccharide-23 01/20/2016   Tdap 06/21/2021   Unspecified SARS-COV-2 Vaccination 11/10/2020, 03/02/2021   Zoster Recombinat (Shingrix) 06/16/2021   Pertinent  Health Maintenance Due  Topic Date Due   INFLUENZA VACCINE  Completed   Fall Risk 03/16/2020 03/16/2020 05/01/2020 05/02/2020 02/13/2021  Falls in the past year? - - - - -  Was there an injury with Fall? - - - - -  Fall Risk Category Calculator - - - - -  Fall Risk Category - - - - -  Patient Fall Risk Level Moderate fall risk Moderate fall risk High fall risk Moderate fall risk Low fall risk  Patient at Risk for Falls Due to - - - - -  Fall risk Follow up - - - - -   Functional Status Survey:    Vitals:   07/30/21 1549  BP: 102/60  Pulse: 75  Resp: 18  Temp: 97.6 F (36.4 C)  SpO2: 98%  Weight: 144 lb 6.4 oz (65.5 kg)  Height: 6' (1.829 m)   Body mass index is 19.58 kg/m. Physical Exam Vitals and nursing note reviewed.  Constitutional:      Appearance: Normal appearance.  HENT:     Head: Normocephalic and atraumatic.     Mouth/Throat:     Mouth: Mucous membranes are moist.  Eyes:     Extraocular Movements: Extraocular movements intact.     Conjunctiva/sclera: Conjunctivae normal.     Pupils: Pupils are equal, round, and reactive to light.   Cardiovascular:     Rate and Rhythm: Normal rate and regular rhythm.     Heart sounds: No murmur heard. Pulmonary:     Effort: Pulmonary effort is normal.     Breath sounds: No wheezing.  Abdominal:     General: Bowel sounds are normal.     Palpations: Abdomen is soft.     Tenderness: There is no abdominal tenderness.  Musculoskeletal:     Cervical back: Normal range of motion and neck supple.     Right lower leg: No edema.     Left lower leg: No edema.     Comments: C/o in buttocks.   Skin:    General: Skin is warm and dry.     Findings: Bruising present.     Comments: Bruises sacral, coccyx, right buttock, a tennis ball sized hematoma right buttock. Able to walk but c/o pain in the right buttock.  Neurological:     General: No focal deficit present.     Mental Status: He is alert. Mental status is at baseline.     Motor: No weakness.     Gait: Gait abnormal.     Comments: Oriented to person, place  Psychiatric:        Mood and Affect: Mood normal.    Labs reviewed: Recent Labs    02/13/21 0509 04/13/21 0000 06/06/2021 2140 07/23/21 0000  NA 140 144 140 141  K 4.6 4.3 4.4 3.9  CL 107 106 106 105  CO2 28 26* 25 27*  GLUCOSE 100*  --  109*  --   BUN 30* 30* 30* 31*  CREATININE 1.59* 1.6* 1.52* 1.6*  CALCIUM 9.0 9.6 8.6* 8.9   Recent Labs    04/13/21 0000 06/10/2021 2140 07/23/21 0000  AST 25 24 31   ALT 25 24 30   ALKPHOS 92 76 84  BILITOT  --  0.6  --  PROT  --  5.7*  --   ALBUMIN 4.6 3.4* 4.3   Recent Labs    02/13/21 0509 04/13/21 0000 05/20/2021 2140 07/23/21 0000  WBC 7.8 8.6 8.4 9.5  NEUTROABS 6.2  --  6.7 8,066.00  HGB 9.0* 11.3* 9.6* 10.6*  HCT 28.6* 35* 30.6* 32*  MCV 97.3  --  97.1  --   PLT 133* 151 122* 108*   Lab Results  Component Value Date   TSH 3.97 07/08/2021   Lab Results  Component Value Date   HGBA1C 5.1 09/10/2019   Lab Results  Component Value Date   CHOL 114 04/13/2021   HDL 37 04/13/2021   LDLCALC 59 04/13/2021    TRIG 101 04/13/2021   CHOLHDL 4.2 02/26/2020    Significant Diagnostic Results in last 30 days:  No results found.  Assessment/Plan Iron deficiency anemia 07/29/21 wbc 6.1, Hgb 8.1, plt 103. Hold ASA, repeat CBC, Iron, Ferritin Vit B12. Obtain FOBT x3   Frequent falls Frequent falls x3 about 2 weeks ago, c/o R buttock pain, noted bruise and hematoma, Hx of fall with injury of R ribs fx 3rd, 4th, 7th, healed.  Recommend to remove the area rug from his room, close supervision/assistance for safety, PT to eval. Repeat X-ray pelvis, lumbar/sacral spine X-ray to evaluate further. May consider CT if no better.   CORONARY ATHEROSCLEROSIS NATIVE CORONARY ARTERY NSTEMI stent f/u cardiology. Hospital stay 03/11/20-03/16/20 for NSTEMI, troponin 300s at ED, Cath showed LAD stenosis, underwent drug eluting stent, EF 55-60%, takes Atorvastatin, ASA,, Added Isosorbide 15mg  since last chest pain 05/01/20. 02/13/21 ED for chest pain, f/u Cardiology 04/06/21, took him off Plavix.   Essential hypertension, benign controlled, off Metoprolol.  GERD without esophagitis Stable, takes Pantoprazole  CKD (chronic kidney disease) stage 3, GFR 30-59 ml/min ([facility]) Bun/creat 33/1.66 07/29/21  Alzheimer's dementia without behavioral disturbance ([facility]) didn't tolerate memory meds, underwent neurology eval in the past. TSH 3.97 07/08/21  Hereditary and idiopathic peripheral neuropathy  takes Gabapentin  Depression, recurrent ([facility]) His mood is stable,  takes Sertraline  Gait abnormality risk of falling is high, continue ambulating with walker consistently.   Right buttock pain Bruise, hematoma right buttock, c/o pain ? Lower back, right buttock area, able to walk with discomfort/pain. Palpated right buttock pain, no spinal spinous process tenderness noted. Repeat X-ray sacral lumbar spine, pelvis. May CT if persists.       Family/ staff Communication: plan of care reviewed with the patient and charge nurse.    Labs/tests ordered:  CBC, Iron Ferritin, Vit B12, FOBT x3, X-ray lumbar/sacral spine, pelvis.   Time spend 40 minutes.

## 2021-07-31 DIAGNOSIS — R2689 Other abnormalities of gait and mobility: Secondary | ICD-10-CM | POA: Diagnosis not present

## 2021-07-31 DIAGNOSIS — I10 Essential (primary) hypertension: Secondary | ICD-10-CM | POA: Diagnosis not present

## 2021-07-31 DIAGNOSIS — R296 Repeated falls: Secondary | ICD-10-CM | POA: Diagnosis not present

## 2021-07-31 DIAGNOSIS — M6281 Muscle weakness (generalized): Secondary | ICD-10-CM | POA: Diagnosis not present

## 2021-07-31 DIAGNOSIS — M5459 Other low back pain: Secondary | ICD-10-CM | POA: Diagnosis not present

## 2021-08-02 ENCOUNTER — Encounter: Payer: Self-pay | Admitting: Nurse Practitioner

## 2021-08-02 DIAGNOSIS — M545 Low back pain, unspecified: Secondary | ICD-10-CM | POA: Diagnosis not present

## 2021-08-02 DIAGNOSIS — W19XXXA Unspecified fall, initial encounter: Secondary | ICD-10-CM | POA: Diagnosis not present

## 2021-08-02 DIAGNOSIS — R102 Pelvic and perineal pain: Secondary | ICD-10-CM | POA: Diagnosis not present

## 2021-08-02 DIAGNOSIS — M7918 Myalgia, other site: Secondary | ICD-10-CM | POA: Insufficient documentation

## 2021-08-02 DIAGNOSIS — M533 Sacrococcygeal disorders, not elsewhere classified: Secondary | ICD-10-CM | POA: Diagnosis not present

## 2021-08-02 NOTE — Assessment & Plan Note (Signed)
NSTEMI stent f/u cardiology. Hospital stay 03/11/20-03/16/20 for NSTEMI, troponin 300s at ED, Cath showed LAD stenosis, underwent drug eluting stent, EF 55-60%, takes Atorvastatin, ASA,, Added Isosorbide 15mg  since last chest pain 05/01/20. 02/13/21 ED for chest pain, f/u Cardiology 04/06/21, took him off Plavix.

## 2021-08-02 NOTE — Assessment & Plan Note (Signed)
risk of falling is high, continue ambulating with walker consistently.

## 2021-08-02 NOTE — Assessment & Plan Note (Signed)
didn't tolerate memory meds, underwent neurology eval in the past. TSH 3.97 07/08/21 

## 2021-08-02 NOTE — Assessment & Plan Note (Signed)
Bruise, hematoma right buttock, c/o pain ? Lower back, right buttock area, able to walk with discomfort/pain. Palpated right buttock pain, no spinal spinous process tenderness noted. Repeat X-ray sacral lumbar spine, pelvis. May CT if persists.

## 2021-08-02 NOTE — Assessment & Plan Note (Signed)
Stable, takes Pantoprazole 

## 2021-08-02 NOTE — Assessment & Plan Note (Signed)
takes Gabapentin  

## 2021-08-02 NOTE — Assessment & Plan Note (Signed)
Bun/creat 33/1.66 07/29/21 

## 2021-08-02 NOTE — Assessment & Plan Note (Signed)
His mood is stable,  takes Sertraline

## 2021-08-02 NOTE — Assessment & Plan Note (Signed)
Frequent falls x3 about 2 weeks ago, c/o R buttock pain, noted bruise and hematoma, Hx of fall with injury of R ribs fx 3rd, 4th, 7th, healed.  Recommend to remove the area rug from his room, close supervision/assistance for safety, PT to eval. Repeat X-ray pelvis, lumbar/sacral spine X-ray to evaluate further. May consider CT if no better.

## 2021-08-02 NOTE — Assessment & Plan Note (Signed)
controlled, off Metoprolol.

## 2021-08-03 DIAGNOSIS — I10 Essential (primary) hypertension: Secondary | ICD-10-CM | POA: Diagnosis not present

## 2021-08-03 DIAGNOSIS — M5459 Other low back pain: Secondary | ICD-10-CM | POA: Diagnosis not present

## 2021-08-03 DIAGNOSIS — R296 Repeated falls: Secondary | ICD-10-CM | POA: Diagnosis not present

## 2021-08-03 DIAGNOSIS — M6281 Muscle weakness (generalized): Secondary | ICD-10-CM | POA: Diagnosis not present

## 2021-08-03 DIAGNOSIS — D649 Anemia, unspecified: Secondary | ICD-10-CM | POA: Diagnosis not present

## 2021-08-03 DIAGNOSIS — R2689 Other abnormalities of gait and mobility: Secondary | ICD-10-CM | POA: Diagnosis not present

## 2021-08-03 DIAGNOSIS — D510 Vitamin B12 deficiency anemia due to intrinsic factor deficiency: Secondary | ICD-10-CM | POA: Diagnosis not present

## 2021-08-04 ENCOUNTER — Encounter: Payer: Self-pay | Admitting: Orthopedic Surgery

## 2021-08-04 ENCOUNTER — Non-Acute Institutional Stay: Payer: Medicare Other | Admitting: Orthopedic Surgery

## 2021-08-04 DIAGNOSIS — K5909 Other constipation: Secondary | ICD-10-CM

## 2021-08-04 DIAGNOSIS — R296 Repeated falls: Secondary | ICD-10-CM

## 2021-08-04 DIAGNOSIS — D500 Iron deficiency anemia secondary to blood loss (chronic): Secondary | ICD-10-CM | POA: Diagnosis not present

## 2021-08-04 DIAGNOSIS — M7918 Myalgia, other site: Secondary | ICD-10-CM

## 2021-08-04 DIAGNOSIS — M5459 Other low back pain: Secondary | ICD-10-CM | POA: Diagnosis not present

## 2021-08-04 DIAGNOSIS — R2689 Other abnormalities of gait and mobility: Secondary | ICD-10-CM | POA: Diagnosis not present

## 2021-08-04 DIAGNOSIS — M6281 Muscle weakness (generalized): Secondary | ICD-10-CM | POA: Diagnosis not present

## 2021-08-04 DIAGNOSIS — I10 Essential (primary) hypertension: Secondary | ICD-10-CM | POA: Diagnosis not present

## 2021-08-04 LAB — CBC AND DIFFERENTIAL
HCT: 27 — AB (ref 41–53)
Hemoglobin: 8.8 — AB (ref 13.5–17.5)
Platelets: 115 — AB (ref 150–399)
WBC: 5.8

## 2021-08-04 LAB — IRON,TIBC AND FERRITIN PANEL
%SAT: 20
Ferritin: 132
Iron: 48
TIBC: 238

## 2021-08-04 LAB — VITAMIN B12: Vitamin B-12: 256

## 2021-08-04 LAB — CBC: RBC: 2.82 — AB (ref 3.87–5.11)

## 2021-08-04 MED ORDER — SENNOSIDES 8.6 MG PO TABS: 1.0000 | ORAL_TABLET | Freq: Every day | ORAL | 0 refills | Status: DC

## 2021-08-04 MED ORDER — POLYETHYLENE GLYCOL 3350 17 GM/SCOOP PO POWD: 17.0000 g | Freq: Every day | ORAL | 1 refills | Status: DC | PRN

## 2021-08-04 NOTE — Progress Notes (Signed)
Location:  [facility] Room Number: YF749/S Place of Service:  ALF [phone]) Provider: Yvonna Alanis, NP   Patient Care Team: Virgie Dad, MD as PCP - General (Internal Medicine) Elouise Munroe, MD as PCP - Cardiology (Cardiology)  Extended Emergency Contact Information Primary Emergency Contact: Weeks,[name] Address: [address]          [place], [place] [address] [name] of [place] Phone: [phone] Mobile Phone: [phone] Relation: Daughter Secondary Emergency Contact: [name], [place] of [place] Mobile Phone: [phone] Relation: Daughter  Code Status:  Full code Goals of care: Advanced Directive information Advanced Directives 08/04/2021  Does Patient Have a Medical Advance Directive? Yes  Type of Paramedic of [place];Living will  Does patient want to make changes to medical advance directive? No - Patient declined  Copy of [place] in Chart? Yes - validated most recent copy scanned in chart (See row information)  Would patient like information on creating a medical advance directive? -     Chief Complaint  Patient presents with   Acute Visit    Low hemoglobin    HPI:  Pt is a 88 y.o. male seen today for an acute visit for low hemoglobin.   Recent lab work for anemia reveals Iron 48, TIBC 238, hgb 8.8, rbc 2.82, and hct 26.9 08/03/2020. Hgb has trended down in the past few months, last noted to be 9.6 05/2021> 8.1 07/29/2021. FOBT positive x 2, 3rd test pending. Hx colon and EGD in past. He currently is taking ferrous sulfate 3x/week. Today he complains of hard stools and constipation.   In addition, he has had 3 falls within the past 2 weeks. He has complained of pain in his right buttocks at times. Xray pelvis, lumbar/sacral spine ordered- results pending. He continues to ambulate throughout AL unit today without difficulty. He is given tylenol  1000 mg and gabapentin 100 mg bid.    Past Medical History:  Diagnosis Date   Anemia    Arthritis    arthritis,osteopenia,"spinal stenosis"   Bladder neck obstruction 03/28/2011   CAD (coronary artery disease)    Cancer ([facility]) 12-06-12   Prostate cancer'98   CKD (chronic kidney disease) stage 3, GFR 30-59 ml/min ([facility]) 08/17/2017   Depression    Essential hypertension, benign 04/10/2009   GERD (gastroesophageal reflux disease)    GERD without esophagitis 11/05/2016   H/O hiatal hernia    H/O vertigo 12-06-12   none recent   Hyperlipidemia    IBS (irritable bowel syndrome)    Mixed hyperlipidemia 04/10/2009   Neuropathy    Osteopenia    Peripheral neuropathy 12-06-12   peripheral neuropathy   Rhinitis    RLS (restless legs syndrome)    Vascular dementia ([facility]) 09/30/2019   Vitamin B12 deficiency    Past Surgical History:  Procedure Laterality Date   APPENDECTOMY     CARPAL TUNNEL RELEASE     both hands   cataract surgery Left 12-06-12   recent surgery   CHOLECYSTECTOMY     COLONOSCOPY WITH PROPOFOL N/A 12/25/2012   Procedure: COLONOSCOPY WITH PROPOFOL;  Surgeon: Garlan Fair, MD;  [facility];  Service: Endoscopy;  Laterality: N/A;   CORONARY STENT INTERVENTION N/A 03/13/2020   Procedure: CORONARY STENT INTERVENTION;  Surgeon: Nelva Bush, MD;  Location: [facility];  Service: Cardiovascular;  Laterality: N/A;   ESOPHAGOGASTRODUODENOSCOPY (EGD)  WITH PROPOFOL N/A 12/25/2012   Procedure: ESOPHAGOGASTRODUODENOSCOPY (EGD) WITH PROPOFOL;  Surgeon: Garlan Fair, MD;  [facility];  Service: Endoscopy;  Laterality: N/A;   INTRAVASCULAR ULTRASOUND/IVUS N/A 03/13/2020   Procedure: Intravascular Ultrasound/IVUS;  Surgeon: Nelva Bush, MD;  Location: [facility];  Service: Cardiovascular;  Laterality: N/A;   LEFT HEART CATH AND CORONARY ANGIOGRAPHY N/A 03/13/2020   Procedure: LEFT HEART CATH AND CORONARY ANGIOGRAPHY;  Surgeon: Nelva Bush, MD;  Location: [facility];  Service: Cardiovascular;  Laterality: N/A;   PROSTATE SURGERY  12-06-12   TONSILLECTOMY     TOTAL KNEE ARTHROPLASTY Right 11/25/2014   Procedure: RIGHT TOTAL KNEE ARTHROPLASTY;  Surgeon: Melrose Nakayama, MD;  Location: [facility];  Service: Orthopedics;  Laterality: Right;    Allergies  Allergen Reactions   Niacin Itching   Penicillins Other (See Comments)    "rash"    Tramadol Itching    Outpatient Encounter Medications as of 08/04/2021  Medication Sig   acetaminophen (TYLENOL) 500 MG tablet Take 1,000 mg by mouth in the morning and at bedtime.   atorvastatin (LIPITOR) 80 MG tablet Take 1 tablet (80 mg total) by mouth daily.   Cholecalciferol (VITAMIN D) 50 MCG (2000 UT) tablet Take 1 pill daily.   gabapentin (NEURONTIN) 100 MG capsule Take 200 mg by mouth 2 (two) times daily.   Iron, Ferrous Sulfate, 325 (65 Fe) MG TABS Take 1 tablet by mouth. **PLEASE GIVE THIS MED WITH FOOD** Mon, Wed, and Fri   isosorbide mononitrate (IMDUR) 30 MG 24 hr tablet Take 30 mg by mouth daily. Take 1/2 tab =15 mg, oral, Once A Day, **HOLD IF SBP IS LESS THAN 100**   lactose free nutrition (BOOST) LIQD Take 237 mLs by mouth daily.   nitroGLYCERIN (NITROSTAT) 0.4 MG SL tablet Place 0.4 mg under the tongue every 5 (five) minutes as needed for chest pain. X 3   pantoprazole (PROTONIX) 40 MG tablet Take 40 mg by mouth 2 (two) times daily.   sertraline (ZOLOFT) 100 MG tablet 1 tablet Daily for Mood   sertraline (ZOLOFT) 25 MG tablet Take 25 mg by mouth daily. Take with 100 mg tab to total 125 mg daily.   [DISCONTINUED] aspirin EC 81 MG tablet Take 81 mg by mouth daily. Swallow whole. (Patient not taking: Reported on 08/04/2021)   No facility-administered encounter medications on file as of 08/04/2021.    Review of Systems  Unable to perform ROS: Dementia   Immunization History  Administered Date(s) Administered   Influenza, High Dose Seasonal PF 03/05/2015,  03/05/2018   Influenza-Unspecified 04/22/2016, 03/13/2017, 06/13/2018, 03/28/2019, 04/01/2021   Moderna Sars-Covid-2 Vaccination 06/17/2019, 07/15/2019, 04/21/2020   Pneumococcal Conjugate-13 05/23/2017   Pneumococcal Polysaccharide-23 01/20/2016   Tdap 06/21/2021   Unspecified SARS-COV-2 Vaccination 11/10/2020, 03/02/2021   Zoster Recombinat (Shingrix) 06/16/2021   Pertinent  Health Maintenance Due  Topic Date Due   INFLUENZA VACCINE  Completed   Fall Risk 03/16/2020 03/16/2020 05/01/2020 05/02/2020 02/13/2021  Falls in the past year? - - - - -  Was there an injury with Fall? - - - - -  Fall Risk Category Calculator - - - - -  Fall Risk Category - - - - -  Patient Fall Risk Level Moderate fall risk Moderate fall risk High fall risk Moderate fall risk Low fall risk  Patient at Risk for Falls Due to - - - - -  Fall risk Follow up - - - - -   Functional  Status Survey:    Vitals:   08/04/21 1421  BP: (!) 118/58  Pulse: 82  Resp: 20  Temp: 98.3 F (36.8 C)  SpO2: 98%  Weight: 144 lb 6.4 oz (65.5 kg)  Height: 6' (1.829 m)   Body mass index is 19.58 kg/m. Physical Exam Vitals reviewed.  Constitutional:      General: He is not in acute distress. HENT:     Head: Normocephalic.  Eyes:     General:        Right eye: No discharge.        Left eye: No discharge.  Cardiovascular:     Rate and Rhythm: Normal rate and regular rhythm.     Pulses: Normal pulses.     Heart sounds: Normal heart sounds.  Pulmonary:     Effort: Pulmonary effort is normal. No respiratory distress.     Breath sounds: Normal breath sounds. No wheezing.  Abdominal:     General: Bowel sounds are normal. There is no distension.     Palpations: Abdomen is soft.     Tenderness: There is no abdominal tenderness.  Musculoskeletal:     Cervical back: Neck supple.     Right lower leg: No edema.     Left lower leg: No edema.  Skin:    General: Skin is warm and dry.     Comments: Bruising to right  buttocks/sacral region, no skin breakdown, non tender to touch today  Neurological:     General: No focal deficit present.     Mental Status: He is alert. Mental status is at baseline.     Motor: Weakness present.     Gait: Gait abnormal.     Comments: walker  Psychiatric:        Mood and Affect: Mood normal.        Behavior: Behavior normal.    Labs reviewed: Recent Labs    02/13/21 0509 04/13/21 0000 05/29/2021 2140 07/23/21 0000 07/29/21 0000  NA 140   < > 140 141 146  K 4.6   < > 4.4 3.9 4.2  CL 107   < > 106 105 109*  CO2 28   < > 25 27* 26*  GLUCOSE 100*  --  109*  --   --   BUN 30*   < > 30* 31* 33*  CREATININE 1.59*   < > 1.52* 1.6* 1.7*  CALCIUM 9.0   < > 8.6* 8.9 8.4*   < > = values in this interval not displayed.   Recent Labs    04/13/21 0000 05/16/2021 2140 07/23/21 0000  AST 25 24 31   ALT 25 24 30   ALKPHOS 92 76 84  BILITOT  --  0.6  --   PROT  --  5.7*  --   ALBUMIN 4.6 3.4* 4.3   Recent Labs    02/13/21 0509 04/13/21 0000 06/09/2021 2140 07/23/21 0000 07/29/21 0000  WBC 7.8   < > 8.4 9.5 6.1  NEUTROABS 6.2  --  6.7 8,066.00  --   HGB 9.0*   < > 9.6* 10.6* 8.1*  HCT 28.6*   < > 30.6* 32* 25*  MCV 97.3  --  97.1  --   --   PLT 133*   < > 122* 108* 103*   < > = values in this interval not displayed.   Lab Results  Component Value Date   TSH 3.97 07/08/2021   Lab Results  Component Value Date   HGBA1C  5.1 09/10/2019   Lab Results  Component Value Date   CHOL 114 04/13/2021   HDL 37 04/13/2021   LDLCALC 59 04/13/2021   TRIG 101 04/13/2021   CHOLHDL 4.2 02/26/2020    Significant Diagnostic Results in last 30 days:  No results found.  Assessment/Plan 1. Iron deficiency anemia due to chronic blood loss - Iron 48, TIBC 238, rbc 2.82, and hct 26.9 08/03/2020 - hgb 9.6 05/2021> 8.1 07/29/2021> 8.8 08/03/2021 - last FOBT pending - will increase ferrous sulfate to M/W/F/Sat  2. Right buttock pain - due to frequent falls x 2 weeks -  bruising to buttocks- appears to be healing - Xray to pelvis, lumbar/sacral spine- pending - cont tylenol and gabapentin for pain  3. Frequent falls - see above  4. Other constipation - reports hard stools with ferrous sulfate - start senna 8.6 mg daily - start miralax daily prn   Family/ staff Communication: plan discussed with patient and nurse  Labs/tests ordered:  none

## 2021-08-04 NOTE — Progress Notes (Deleted)
Location:  [facility] Room Number: YT035/W Place of Service:  ALF [phone]) Provider: Yvonna Alanis, NP  Patient Care Team: Virgie Dad, MD as PCP - General (Internal Medicine) Elouise Munroe, MD as PCP - Cardiology (Cardiology)  Extended Emergency Contact Information Primary Emergency Contact: Humphrey,Ryan Address: [address]          [place], [place] [address] Ryan Humphrey of [place] Phone: [phone] Mobile Phone: [phone] Relation: Daughter Secondary Emergency Contact: Ryan Humphrey, [place] of [place] Mobile Phone: [phone] Relation: Daughter  Code Status: Full code Goals of care: Advanced Directive information Advanced Directives 08/04/2021  Does Patient Have a Medical Advance Directive? Yes  Type of Paramedic of [place];Living will  Does patient want to make changes to medical advance directive? No - Patient declined  Copy of [place] in Chart? Yes - validated most recent copy scanned in chart (See row information)  Would patient like information on creating a medical advance directive? -     Chief Complaint  Patient presents with   Acute Visit    Low hemoglobin    HPI:  Pt is a 88 y.o. male seen today for an acute visit for    Past Medical History:  Diagnosis Date   Anemia    Arthritis    arthritis,osteopenia,"spinal stenosis"   Bladder neck obstruction 03/28/2011   CAD (coronary artery disease)    Cancer ([place]) 12-06-12   Prostate cancer'98   CKD (chronic kidney disease) stage 3, GFR 30-59 ml/min ([facility]) 08/17/2017   Depression    Essential hypertension, benign 04/10/2009   GERD (gastroesophageal reflux disease)    GERD without esophagitis 11/05/2016   H/O hiatal hernia    H/O vertigo 12-06-12   none recent   Hyperlipidemia    IBS (irritable bowel syndrome)    Mixed hyperlipidemia 04/10/2009   Neuropathy    Osteopenia    Peripheral  neuropathy 12-06-12   peripheral neuropathy   Rhinitis    RLS (restless legs syndrome)    Vascular dementia ([place]) 09/30/2019   Vitamin B12 deficiency    Past Surgical History:  Procedure Laterality Date   APPENDECTOMY     CARPAL TUNNEL RELEASE     both hands   cataract surgery Left 12-06-12   recent surgery   CHOLECYSTECTOMY     COLONOSCOPY WITH PROPOFOL N/A 12/25/2012   Procedure: COLONOSCOPY WITH PROPOFOL;  Surgeon: Garlan Fair, MD;  [facility]: [facility];  Service: Endoscopy;  Laterality: N/A;   CORONARY STENT INTERVENTION N/A 03/13/2020   Procedure: CORONARY STENT INTERVENTION;  Surgeon: Nelva Bush, MD;  Location: [facility];  Service: Cardiovascular;  Laterality: N/A;   ESOPHAGOGASTRODUODENOSCOPY (EGD) WITH PROPOFOL N/A 12/25/2012   Procedure: ESOPHAGOGASTRODUODENOSCOPY (EGD) WITH PROPOFOL;  Surgeon: Garlan Fair, MD;  [facility];  Service: Endoscopy;  Laterality: N/A;   INTRAVASCULAR ULTRASOUND/IVUS N/A 03/13/2020   Procedure: Intravascular Ultrasound/IVUS;  Surgeon: Nelva Bush, MD;  Location: [facility];  Service: Cardiovascular;  Laterality: N/A;   LEFT HEART CATH AND CORONARY ANGIOGRAPHY N/A 03/13/2020   Procedure: LEFT HEART CATH AND CORONARY ANGIOGRAPHY;  Surgeon: Nelva Bush, MD;  Location: [facility];  Service: Cardiovascular;  Laterality: N/A;   PROSTATE SURGERY  12-06-12   TONSILLECTOMY     TOTAL KNEE ARTHROPLASTY Right 11/25/2014   Procedure: RIGHT TOTAL KNEE ARTHROPLASTY;  Surgeon: Melrose Nakayama, MD;  Location: [facility];  Service: Orthopedics;  Laterality: Right;    Allergies  Allergen Reactions   Niacin Itching   Penicillins Other (See Comments)    "rash"    Tramadol Itching    Outpatient Encounter Medications as of 08/04/2021  Medication Sig   acetaminophen (TYLENOL) 500 MG tablet Take 1,000 mg by mouth in the morning and at bedtime.   atorvastatin (LIPITOR) 80 MG tablet Take 1 tablet (80 mg total) by  mouth daily.   Cholecalciferol (VITAMIN D) 50 MCG (2000 UT) tablet Take 1 pill daily.   gabapentin (NEURONTIN) 100 MG capsule Take 200 mg by mouth 2 (two) times daily.   Iron, Ferrous Sulfate, 325 (65 Fe) MG TABS Take 1 tablet by mouth. **PLEASE GIVE THIS MED WITH FOOD** Mon, Wed, and Fri   isosorbide mononitrate (IMDUR) 30 MG 24 hr tablet Take 30 mg by mouth daily. Take 1/2 tab =15 mg, oral, Once A Day, **HOLD IF SBP IS LESS THAN 100**   lactose free nutrition (BOOST) LIQD Take 237 mLs by mouth daily.   nitroGLYCERIN (NITROSTAT) 0.4 MG SL tablet Place 0.4 mg under the tongue every 5 (five) minutes as needed for chest pain. X 3   pantoprazole (PROTONIX) 40 MG tablet Take 40 mg by mouth 2 (two) times daily.   sertraline (ZOLOFT) 100 MG tablet 1 tablet Daily for Mood   sertraline (ZOLOFT) 25 MG tablet Take 25 mg by mouth daily. Take with 100 mg tab to total 125 mg daily.   [DISCONTINUED] aspirin EC 81 MG tablet Take 81 mg by mouth daily. Swallow whole. (Patient not taking: Reported on 08/04/2021)   No facility-administered encounter medications on file as of 08/04/2021.    Review of Systems  Immunization History  Administered Date(s) Administered   Influenza, High Dose Seasonal PF 03/05/2015, 03/05/2018   Influenza-Unspecified 04/22/2016, 03/13/2017, 06/13/2018, 03/28/2019, 04/01/2021   Moderna Sars-Covid-2 Vaccination 06/17/2019, 07/15/2019, 04/21/2020   Pneumococcal Conjugate-13 05/23/2017   Pneumococcal Polysaccharide-23 01/20/2016   Tdap 06/21/2021   Unspecified SARS-COV-2 Vaccination 11/10/2020, 03/02/2021   Zoster Recombinat (Shingrix) 06/16/2021   Pertinent  Health Maintenance Due  Topic Date Due   INFLUENZA VACCINE  Completed   Fall Risk 03/16/2020 03/16/2020 05/01/2020 05/02/2020 02/13/2021  Falls in the past year? - - - - -  Was there an injury with Fall? - - - - -  Fall Risk Category Calculator - - - - -  Fall Risk Category - - - - -  Patient Fall Risk Level Moderate fall risk  Moderate fall risk High fall risk Moderate fall risk Low fall risk  Patient at Risk for Falls Due to - - - - -  Fall risk Follow up - - - - -   Functional Status Survey:    Vitals:   08/04/21 1421  BP: (!) 118/58  Pulse: 82  Resp: 20  Temp: 98.3 F (36.8 C)  SpO2: 98%  Weight: 144 lb 6.4 oz (65.5 kg)  Height: 6' (1.829 m)   Body mass index is 19.58 kg/m. Physical Exam  Labs reviewed: Recent Labs    02/13/21 0509 04/13/21 0000 05/24/2021 2140 07/23/21 0000  NA 140 144 140 141  K 4.6 4.3 4.4 3.9  CL 107 106 106 105  CO2 28 26* 25 27*  GLUCOSE 100*  --  109*  --   BUN 30* 30* 30* 31*  CREATININE 1.59* 1.6* 1.52* 1.6*  CALCIUM 9.0 9.6 8.6* 8.9   Recent Labs    04/13/21 0000  05/13/2021 2140 07/23/21 0000  AST 25 24 31   ALT 25 24 30   ALKPHOS 92 76 84  BILITOT  --  0.6  --   PROT  --  5.7*  --   ALBUMIN 4.6 3.4* 4.3   Recent Labs    02/13/21 0509 04/13/21 0000 06/02/2021 2140 07/23/21 0000  WBC 7.8 8.6 8.4 9.5  NEUTROABS 6.2  --  6.7 8,066.00  HGB 9.0* 11.3* 9.6* 10.6*  HCT 28.6* 35* 30.6* 32*  MCV 97.3  --  97.1  --   PLT 133* 151 122* 108*   Lab Results  Component Value Date   TSH 3.97 07/08/2021   Lab Results  Component Value Date   HGBA1C 5.1 09/10/2019   Lab Results  Component Value Date   CHOL 114 04/13/2021   HDL 37 04/13/2021   LDLCALC 59 04/13/2021   TRIG 101 04/13/2021   CHOLHDL 4.2 02/26/2020    Significant Diagnostic Results in last 30 days:  No results found.  Assessment/Plan There are no diagnoses linked to this encounter.   Family/ staff Communication: ***  Labs/tests ordered:  ***

## 2021-08-05 DIAGNOSIS — M6281 Muscle weakness (generalized): Secondary | ICD-10-CM | POA: Diagnosis not present

## 2021-08-05 DIAGNOSIS — R296 Repeated falls: Secondary | ICD-10-CM | POA: Diagnosis not present

## 2021-08-05 DIAGNOSIS — R2689 Other abnormalities of gait and mobility: Secondary | ICD-10-CM | POA: Diagnosis not present

## 2021-08-05 DIAGNOSIS — I10 Essential (primary) hypertension: Secondary | ICD-10-CM | POA: Diagnosis not present

## 2021-08-05 DIAGNOSIS — M5459 Other low back pain: Secondary | ICD-10-CM | POA: Diagnosis not present

## 2021-08-06 ENCOUNTER — Non-Acute Institutional Stay: Payer: Medicare Other | Admitting: Nurse Practitioner

## 2021-08-06 ENCOUNTER — Encounter: Payer: Self-pay | Admitting: Nurse Practitioner

## 2021-08-06 DIAGNOSIS — E039 Hypothyroidism, unspecified: Secondary | ICD-10-CM | POA: Diagnosis not present

## 2021-08-06 DIAGNOSIS — F339 Major depressive disorder, recurrent, unspecified: Secondary | ICD-10-CM | POA: Diagnosis not present

## 2021-08-06 DIAGNOSIS — G309 Alzheimer's disease, unspecified: Secondary | ICD-10-CM

## 2021-08-06 DIAGNOSIS — R269 Unspecified abnormalities of gait and mobility: Secondary | ICD-10-CM | POA: Diagnosis not present

## 2021-08-06 DIAGNOSIS — I10 Essential (primary) hypertension: Secondary | ICD-10-CM

## 2021-08-06 DIAGNOSIS — N1832 Chronic kidney disease, stage 3b: Secondary | ICD-10-CM

## 2021-08-06 DIAGNOSIS — D500 Iron deficiency anemia secondary to blood loss (chronic): Secondary | ICD-10-CM

## 2021-08-06 DIAGNOSIS — F0280 Dementia in other diseases classified elsewhere without behavioral disturbance: Secondary | ICD-10-CM

## 2021-08-06 DIAGNOSIS — I2510 Atherosclerotic heart disease of native coronary artery without angina pectoris: Secondary | ICD-10-CM

## 2021-08-06 DIAGNOSIS — R296 Repeated falls: Secondary | ICD-10-CM

## 2021-08-06 DIAGNOSIS — R300 Dysuria: Secondary | ICD-10-CM

## 2021-08-06 DIAGNOSIS — K5901 Slow transit constipation: Secondary | ICD-10-CM

## 2021-08-06 DIAGNOSIS — M6281 Muscle weakness (generalized): Secondary | ICD-10-CM | POA: Diagnosis not present

## 2021-08-06 DIAGNOSIS — E860 Dehydration: Secondary | ICD-10-CM | POA: Diagnosis not present

## 2021-08-06 DIAGNOSIS — N390 Urinary tract infection, site not specified: Secondary | ICD-10-CM | POA: Diagnosis not present

## 2021-08-06 DIAGNOSIS — R2689 Other abnormalities of gait and mobility: Secondary | ICD-10-CM | POA: Diagnosis not present

## 2021-08-06 DIAGNOSIS — G609 Hereditary and idiopathic neuropathy, unspecified: Secondary | ICD-10-CM | POA: Diagnosis not present

## 2021-08-06 DIAGNOSIS — M5459 Other low back pain: Secondary | ICD-10-CM | POA: Diagnosis not present

## 2021-08-06 LAB — BASIC METABOLIC PANEL
BUN: 32 — AB (ref 4–21)
CO2: 27 — AB (ref 13–22)
Chloride: 105 (ref 99–108)
Creatinine: 1.7 — AB (ref 0.6–1.3)
Glucose: 104
Potassium: 5 mEq/L (ref 3.5–5.1)
Sodium: 141 (ref 137–147)

## 2021-08-06 LAB — HEPATIC FUNCTION PANEL
ALT: 14 U/L (ref 10–40)
AST: 18 (ref 14–40)
Alkaline Phosphatase: 150 — AB (ref 25–125)
Bilirubin, Total: 0.5

## 2021-08-06 LAB — COMPREHENSIVE METABOLIC PANEL
Albumin: 3.9 (ref 3.5–5.0)
Calcium: 8.7 (ref 8.7–10.7)
Globulin: 2.1
eGFR: 39

## 2021-08-06 LAB — CBC AND DIFFERENTIAL
HCT: 29 — AB (ref 41–53)
Hemoglobin: 9.4 — AB (ref 13.5–17.5)
Platelets: 146 10*3/uL — AB (ref 150–400)
WBC: 8.3

## 2021-08-06 LAB — CBC: RBC: 3.06 — AB (ref 3.87–5.11)

## 2021-08-06 NOTE — Assessment & Plan Note (Signed)
controlled, off Metoprolol.

## 2021-08-06 NOTE — Assessment & Plan Note (Signed)
Frequent falls x3 about 2 weeks ago, c/o R buttock pain, noted bruise and hematoma, Hx of fall with injury of R ribs fx 3rd, 4th, 7th, healed.  Recommend to remove the area rug from his room, close supervision/assistance for safety, PT consulted.

## 2021-08-06 NOTE — Progress Notes (Addendum)
Location:   [facility] Room Number: 907 Place of Service:  ALF (13) Provider: Lennie Odor Nancyjo Givhan NP  Virgie Dad, MD  Patient Care Team: Virgie Dad, MD as PCP - General (Internal Medicine) Elouise Munroe, MD as PCP - Cardiology (Cardiology)  Extended Emergency Contact Information Primary Emergency Contact: Weeks,[name] Address: [address]          [place], [place] [address] [name] of [place] Phone: [phone] Mobile Phone: [phone] Relation: Daughter Secondary Emergency Contact: [name], [place] of [place] Mobile Phone: [phone] Relation: Daughter  Code Status: DNR Goals of care: Advanced Directive information Advanced Directives 08/04/2021  Does Patient Have a Medical Advance Directive? Yes  Type of Paramedic of [place];Living will  Does patient want to make changes to medical advance directive? No - Patient declined  Copy of [place] in Chart? Yes - validated most recent copy scanned in chart (See row information)  Would patient like information on creating a medical advance directive? -     Chief Complaint  Patient presents with   Acute Visit    Difficulty starting urine flow with pain.     HPI:  Pt is a 88 y.o. male seen today for an acute visit for c/o difficulty starting urine flow with pain, denied abd pain, nausea, vomiting. He is afebrile.     Hgb 9.6 05/26/2021>>8.1 07/29/21<<8.8 08/03/21, on Iron, FOBT positive x2 ? 2/2 taking Iron. Iron 48   R buttock pain: resulted from falls, X-ray 08/02/21 pelvis/sacrum/Coccyx/lumbar spine, no acute bony abnormality.              Frequent falls x3 about 2 weeks ago, c/o R buttock pain, noted bruise and hematoma, Hx of fall with injury of R ribs fx 3rd, 4th, 7th, healed.  Recommend to remove the area rug from his room, close supervision/assistance for safety, PT consulted. , X-ray 08/02/21  pelvis/sacrum/Coccyx/lumbar spine, no acute bony abnormality.   Constipation, started Senna 08/04/21, on MiraLax.              Anemia, had Colon and EGD, takes Iron, Hgb 9.6 05/17/2021>>8.1 07/29/21<<8.8 08/03/21, on Iron, FOBT positive x2 ? 2/2 taking Iron, Iron 48.              CAD, NSTEMI stent f/u cardiology. Hospital stay 03/11/20-03/16/20 for NSTEMI, troponin 300s at ED, Cath showed LAD stenosis, underwent drug eluting stent, EF 55-60%, takes Atorvastatin, ASA,, Added Isosorbide 79m since last chest pain 05/01/20. 02/13/21 ED for chest pain, f/u Cardiology 04/06/21, took him off Plavix.              HTN, controlled, off Metoprolol.             GERD, takes Pantoprazole             AI, EF 55-60%             CKD stage 3, Bun/creat 33/1.66 07/29/21 Alzheimer's dementia, didn't tolerate memory meds, underwent neurology eval in the past. TSH 3.97 07/08/21             Peripheral neuropathy, takes Gabapentin             Depression takes Sertraline             Gait abnormality, risk of falling is high.   Past Medical History:  Diagnosis Date   Anemia    Arthritis  arthritis,osteopenia,"spinal stenosis"   Bladder neck obstruction 03/28/2011   CAD (coronary artery disease)    Cancer ([facility]) 12-06-12   Prostate cancer'98   CKD (chronic kidney disease) stage 3, GFR 30-59 ml/min ([facility]) 08/17/2017   Depression    Essential hypertension, benign 04/10/2009   GERD (gastroesophageal reflux disease)    GERD without esophagitis 11/05/2016   H/O hiatal hernia    H/O vertigo 12-06-12   none recent   Hyperlipidemia    IBS (irritable bowel syndrome)    Mixed hyperlipidemia 04/10/2009   Neuropathy    Osteopenia    Peripheral neuropathy 12-06-12   peripheral neuropathy   Rhinitis    RLS (restless legs syndrome)    Vascular dementia ([facility]) 09/30/2019   Vitamin B12 deficiency    Past Surgical History:  Procedure Laterality Date   APPENDECTOMY     CARPAL TUNNEL RELEASE     both hands    cataract surgery Left 12-06-12   recent surgery   CHOLECYSTECTOMY     COLONOSCOPY WITH PROPOFOL N/A 12/25/2012   Procedure: COLONOSCOPY WITH PROPOFOL;  Surgeon: Garlan Fair, MD;  [facility];  Service: Endoscopy;  Laterality: N/A;   CORONARY STENT INTERVENTION N/A 03/13/2020   Procedure: CORONARY STENT INTERVENTION;  Surgeon: Nelva Bush, MD;  Location: [facility];  Service: Cardiovascular;  Laterality: N/A;   ESOPHAGOGASTRODUODENOSCOPY (EGD) WITH PROPOFOL N/A 12/25/2012   Procedure: ESOPHAGOGASTRODUODENOSCOPY (EGD) WITH PROPOFOL;  Surgeon: Garlan Fair, MD;  [facility];  Service: Endoscopy;  Laterality: N/A;   INTRAVASCULAR ULTRASOUND/IVUS N/A 03/13/2020   Procedure: Intravascular Ultrasound/IVUS;  Surgeon: Nelva Bush, MD;  Location: [facility];  Service: Cardiovascular;  Laterality: N/A;   LEFT HEART CATH AND CORONARY ANGIOGRAPHY N/A 03/13/2020   Procedure: LEFT HEART CATH AND CORONARY ANGIOGRAPHY;  Surgeon: Nelva Bush, MD;  Location: [facility];  Service: Cardiovascular;  Laterality: N/A;   PROSTATE SURGERY  12-06-12   TONSILLECTOMY     TOTAL KNEE ARTHROPLASTY Right 11/25/2014   Procedure: RIGHT TOTAL KNEE ARTHROPLASTY;  Surgeon: Melrose Nakayama, MD;  Location: [facility];  Service: Orthopedics;  Laterality: Right;    Allergies  Allergen Reactions   Niacin Itching   Penicillins Other (See Comments)    "rash"    Tramadol Itching    Allergies as of 08/06/2021       Reactions   Niacin Itching   Penicillins Other (See Comments)   "rash"   Tramadol Itching        Medication List        Accurate as of August 06, 2021 11:59 PM. If you have any questions, ask your nurse or doctor.          acetaminophen 500 MG tablet Commonly known as: TYLENOL Take 1,000 mg by mouth in the morning and at bedtime.   atorvastatin 80 MG tablet Commonly known as: LIPITOR Take 1 tablet (80 mg total) by mouth daily.    gabapentin 100 MG capsule Commonly known as: NEURONTIN Take 200 mg by mouth 2 (two) times daily.   Iron (Ferrous Sulfate) 325 (65 Fe) MG Tabs Take 1 tablet by mouth. **PLEASE GIVE THIS MED WITH FOOD** Mon, Wed, and Fri and Saturday   isosorbide mononitrate 30 MG 24 hr tablet Commonly known as: IMDUR Take 30 mg by mouth daily. Take 1/2 tab =15 mg, oral, Once A Day, **HOLD IF SBP IS LESS THAN 100**   lactose free nutrition Liqd Take 237 mLs by mouth daily.  nitroGLYCERIN 0.4 MG SL tablet Commonly known as: NITROSTAT Place 0.4 mg under the tongue every 5 (five) minutes as needed for chest pain. X 3   pantoprazole 40 MG tablet Commonly known as: PROTONIX Take 40 mg by mouth 2 (two) times daily.   polyethylene glycol powder 17 GM/SCOOP powder Commonly known as: GLYCOLAX/MIRALAX Take 17 g by mouth daily as needed for moderate constipation.   senna 8.6 MG tablet Commonly known as: SENOKOT Take 1 tablet (8.6 mg total) by mouth daily.   sertraline 25 MG tablet Commonly known as: ZOLOFT Take 25 mg by mouth daily. Take with 100 mg tab to total 125 mg daily.   sertraline 100 MG tablet Commonly known as: ZOLOFT 1 tablet Daily for Mood   Vitamin D 50 MCG (2000 UT) tablet Take 1 pill daily.        Review of Systems  Unable to perform ROS: Dementia   Immunization History  Administered Date(s) Administered   Influenza, High Dose Seasonal PF 03/05/2015, 03/05/2018   Influenza-Unspecified 04/22/2016, 03/13/2017, 06/13/2018, 03/28/2019, 04/01/2021   Moderna Sars-Covid-2 Vaccination 06/17/2019, 07/15/2019, 04/21/2020   Pneumococcal Conjugate-13 05/23/2017   Pneumococcal Polysaccharide-23 01/20/2016   Tdap 06/21/2021   Unspecified SARS-COV-2 Vaccination 11/10/2020, 03/02/2021   Zoster Recombinat (Shingrix) 06/16/2021   Pertinent  Health Maintenance Due  Topic Date Due   INFLUENZA VACCINE  Completed   Fall Risk 03/16/2020 03/16/2020 05/01/2020 05/02/2020 02/13/2021   Falls in the past year? - - - - -  Was there an injury with Fall? - - - - -  Fall Risk Category Calculator - - - - -  Fall Risk Category - - - - -  Patient Fall Risk Level Moderate fall risk Moderate fall risk High fall risk Moderate fall risk Low fall risk  Patient at Risk for Falls Due to - - - - -  Fall risk Follow up - - - - -   Functional Status Survey:    Vitals:   08/06/21 1410  BP: 120/60  Pulse: 82  Resp: 20  Temp: 98.2 F (36.8 C)  SpO2: 93%  Weight: 144 lb 6.4 oz (65.5 kg)  Height: 6' (1.829 m)   Body mass index is 19.58 kg/m. Physical Exam Vitals and nursing note reviewed.  Constitutional:      Comments: fatigue  HENT:     Head: Normocephalic and atraumatic.     Mouth/Throat:     Mouth: Mucous membranes are moist.  Eyes:     Extraocular Movements: Extraocular movements intact.     Conjunctiva/sclera: Conjunctivae normal.     Pupils: Pupils are equal, round, and reactive to light.  Cardiovascular:     Rate and Rhythm: Normal rate and regular rhythm.     Heart sounds: No murmur heard. Pulmonary:     Effort: Pulmonary effort is normal.     Breath sounds: No wheezing.  Abdominal:     General: Bowel sounds are normal. There is no distension.     Palpations: Abdomen is soft.     Tenderness: There is no abdominal tenderness. There is no right CVA tenderness, left CVA tenderness, guarding or rebound.  Genitourinary:    Comments: C/o difficulty starting urine flow, painful urination.  Musculoskeletal:     Cervical back: Normal range of motion and neck supple.     Right lower leg: No edema.     Left lower leg: No edema.     Comments: C/o in buttocks.   Skin:    General: Skin  is warm and dry.     Findings: Bruising present.     Comments: Bruises sacral, coccyx, right buttock, a tennis ball sized hematoma right buttock. Able to walk but c/o pain in the right buttock.  Neurological:     General: No focal deficit present.     Mental Status: He is alert.  Mental status is at baseline.     Motor: No weakness.     Gait: Gait abnormal.     Comments: Oriented to person, place  Psychiatric:        Mood and Affect: Mood normal.    Labs reviewed: Recent Labs    02/13/21 0509 04/13/21 0000 05/17/2021 2140 07/23/21 0000 07/29/21 0000  NA 140   < > 140 141 146  K 4.6   < > 4.4 3.9 4.2  CL 107   < > 106 105 109*  CO2 28   < > 25 27* 26*  GLUCOSE 100*  --  109*  --   --   BUN 30*   < > 30* 31* 33*  CREATININE 1.59*   < > 1.52* 1.6* 1.7*  CALCIUM 9.0   < > 8.6* 8.9 8.4*   < > = values in this interval not displayed.   Recent Labs    04/13/21 0000 05/26/2021 2140 07/23/21 0000  AST '25 24 31  ' ALT '25 24 30  ' ALKPHOS 92 76 84  BILITOT  --  0.6  --   PROT  --  5.7*  --   ALBUMIN 4.6 3.4* 4.3   Recent Labs    02/13/21 0509 04/13/21 0000 06/08/2021 2140 07/23/21 0000 07/29/21 0000 08/04/21 0000  WBC 7.8   < > 8.4 9.5 6.1 5.8  NEUTROABS 6.2  --  6.7 8,066.00  --   --   HGB 9.0*   < > 9.6* 10.6* 8.1* 8.8*  HCT 28.6*   < > 30.6* 32* 25* 27*  MCV 97.3  --  97.1  --   --   --   PLT 133*   < > 122* 108* 103* 115*   < > = values in this interval not displayed.   Lab Results  Component Value Date   TSH 3.97 07/08/2021   Lab Results  Component Value Date   HGBA1C 5.1 09/10/2019   Lab Results  Component Value Date   CHOL 114 04/13/2021   HDL 37 04/13/2021   LDLCALC 59 04/13/2021   TRIG 101 04/13/2021   CHOLHDL 4.2 02/26/2020    Significant Diagnostic Results in last 30 days:  No results found.  Assessment/Plan: Difficult or painful urination Will cath UA C/S, CBC/diff, CMP/eGFR. May I+O cath q shift if no urine output.   Iron deficiency anemia Hgb 9.6 05/29/2021>>8.1 07/29/21<<8.8 08/03/21, on Iron, FOBT positive x2 ? 2/2 taking Iron. Iron 48 08/06/21 wbc 8.3, Hgb 9.4, plt 146, neutrophils 82.5, Na 141, K 5.0, Bun 32, creat 1.66, eGFR 39, negative urine culture.   Frequent falls Frequent falls x3 about 2 weeks ago, c/o R  buttock pain, noted bruise and hematoma, Hx of fall with injury of R ribs fx 3rd, 4th, 7th, healed.  Recommend to remove the area rug from his room, close supervision/assistance for safety, PT consulted.   Slow transit constipation started Senna 08/04/21, change  MiraLax to daily.   CORONARY ATHEROSCLEROSIS NATIVE CORONARY ARTERY NSTEMI stent f/u cardiology. Hospital stay 03/11/20-03/16/20 for NSTEMI, troponin 300s at ED, Cath showed LAD stenosis, underwent drug eluting stent, EF 55-60%, takes  Atorvastatin, ASA,, Added Isosorbide 86m since last chest pain 05/01/20. 02/13/21 ED for chest pain, f/u Cardiology 04/06/21, took him off Plavix.   Essential hypertension, benign controlled, off Metoprolol.  CKD (chronic kidney disease) stage 3, GFR 30-59 ml/min ([facility]) Bun/creat 33/1.66 07/29/21  Alzheimer's dementia without behavioral disturbance (HCC)  didn't tolerate memory meds, underwent neurology eval in the past. TSH 3.97 07/08/21  Hereditary and idiopathic peripheral neuropathy takes Gabapentin  Depression, recurrent (HCC) Stable,  takes Sertraline  Gait abnormality risk of falling is high.   Right buttock pain X-ray 08/02/21 pelvis/sacrum/Coccyx/lumbar spine, no acute bony abnormality.     Family/ staff Communication: plan of care reviewed with the patient and charge nurse   Labs/tests ordered:  Cath UA C/S, CBC/diff, CMP/eGFR stat  Time spend 40 minutes.

## 2021-08-06 NOTE — Assessment & Plan Note (Signed)
Will cath UA C/S, CBC/diff, CMP/eGFR. May I+O cath q shift if no urine output.

## 2021-08-06 NOTE — Assessment & Plan Note (Signed)
didn't tolerate memory meds, underwent neurology eval in the past. TSH 3.97 07/08/21 

## 2021-08-06 NOTE — Assessment & Plan Note (Signed)
started Senna 08/04/21, change  MiraLax to daily.

## 2021-08-06 NOTE — Assessment & Plan Note (Addendum)
Hgb 9.6 05/30/2021>>8.1 07/29/21<<8.8 08/03/21, on Iron, FOBT positive x2 ? 2/2 taking Iron. Iron 48 08/06/21 wbc 8.3, Hgb 9.4, plt 146, neutrophils 82.5, Na 141, K 5.0, Bun 32, creat 1.66, eGFR 39, negative urine culture.

## 2021-08-06 NOTE — Assessment & Plan Note (Signed)
Bun/creat 33/1.66 07/29/21 

## 2021-08-06 NOTE — Assessment & Plan Note (Signed)
X-ray 08/02/21 pelvis/sacrum/Coccyx/lumbar spine, no acute bony abnormality.

## 2021-08-06 NOTE — Assessment & Plan Note (Signed)
Stable,  takes Sertraline

## 2021-08-06 NOTE — Assessment & Plan Note (Signed)
NSTEMI stent f/u cardiology. Hospital stay 03/11/20-03/16/20 for NSTEMI, troponin 300s at ED, Cath showed LAD stenosis, underwent drug eluting stent, EF 55-60%, takes Atorvastatin, ASA,, Added Isosorbide 15mg  since last chest pain 05/01/20. 02/13/21 ED for chest pain, f/u Cardiology 04/06/21, took him off Plavix.

## 2021-08-06 NOTE — Assessment & Plan Note (Signed)
takes Gabapentin  

## 2021-08-06 NOTE — Assessment & Plan Note (Signed)
risk of falling is high.

## 2021-08-10 DIAGNOSIS — M6281 Muscle weakness (generalized): Secondary | ICD-10-CM | POA: Diagnosis not present

## 2021-08-10 DIAGNOSIS — M5459 Other low back pain: Secondary | ICD-10-CM | POA: Diagnosis not present

## 2021-08-10 DIAGNOSIS — R2689 Other abnormalities of gait and mobility: Secondary | ICD-10-CM | POA: Diagnosis not present

## 2021-08-10 DIAGNOSIS — R296 Repeated falls: Secondary | ICD-10-CM | POA: Diagnosis not present

## 2021-08-10 DIAGNOSIS — I10 Essential (primary) hypertension: Secondary | ICD-10-CM | POA: Diagnosis not present

## 2021-08-12 DIAGNOSIS — M5459 Other low back pain: Secondary | ICD-10-CM | POA: Diagnosis not present

## 2021-08-12 DIAGNOSIS — R2689 Other abnormalities of gait and mobility: Secondary | ICD-10-CM | POA: Diagnosis not present

## 2021-08-12 DIAGNOSIS — I10 Essential (primary) hypertension: Secondary | ICD-10-CM | POA: Diagnosis not present

## 2021-08-12 DIAGNOSIS — M6281 Muscle weakness (generalized): Secondary | ICD-10-CM | POA: Diagnosis not present

## 2021-08-12 DIAGNOSIS — R296 Repeated falls: Secondary | ICD-10-CM | POA: Diagnosis not present

## 2021-08-14 DIAGNOSIS — M6281 Muscle weakness (generalized): Secondary | ICD-10-CM | POA: Diagnosis not present

## 2021-08-14 DIAGNOSIS — R2689 Other abnormalities of gait and mobility: Secondary | ICD-10-CM | POA: Diagnosis not present

## 2021-08-14 DIAGNOSIS — I10 Essential (primary) hypertension: Secondary | ICD-10-CM | POA: Diagnosis not present

## 2021-08-14 DIAGNOSIS — M5459 Other low back pain: Secondary | ICD-10-CM | POA: Diagnosis not present

## 2021-08-14 DIAGNOSIS — R296 Repeated falls: Secondary | ICD-10-CM | POA: Diagnosis not present

## 2021-08-17 DIAGNOSIS — M5459 Other low back pain: Secondary | ICD-10-CM | POA: Diagnosis not present

## 2021-08-17 DIAGNOSIS — M6281 Muscle weakness (generalized): Secondary | ICD-10-CM | POA: Diagnosis not present

## 2021-08-17 DIAGNOSIS — R2689 Other abnormalities of gait and mobility: Secondary | ICD-10-CM | POA: Diagnosis not present

## 2021-08-17 DIAGNOSIS — R296 Repeated falls: Secondary | ICD-10-CM | POA: Diagnosis not present

## 2021-08-17 DIAGNOSIS — I10 Essential (primary) hypertension: Secondary | ICD-10-CM | POA: Diagnosis not present

## 2021-08-18 DIAGNOSIS — M5459 Other low back pain: Secondary | ICD-10-CM | POA: Diagnosis not present

## 2021-08-18 DIAGNOSIS — I10 Essential (primary) hypertension: Secondary | ICD-10-CM | POA: Diagnosis not present

## 2021-08-18 DIAGNOSIS — R2689 Other abnormalities of gait and mobility: Secondary | ICD-10-CM | POA: Diagnosis not present

## 2021-08-18 DIAGNOSIS — M6281 Muscle weakness (generalized): Secondary | ICD-10-CM | POA: Diagnosis not present

## 2021-08-18 DIAGNOSIS — R296 Repeated falls: Secondary | ICD-10-CM | POA: Diagnosis not present

## 2021-08-20 DIAGNOSIS — I10 Essential (primary) hypertension: Secondary | ICD-10-CM | POA: Diagnosis not present

## 2021-08-20 DIAGNOSIS — R2689 Other abnormalities of gait and mobility: Secondary | ICD-10-CM | POA: Diagnosis not present

## 2021-08-20 DIAGNOSIS — R296 Repeated falls: Secondary | ICD-10-CM | POA: Diagnosis not present

## 2021-08-20 DIAGNOSIS — M5459 Other low back pain: Secondary | ICD-10-CM | POA: Diagnosis not present

## 2021-08-20 DIAGNOSIS — M6281 Muscle weakness (generalized): Secondary | ICD-10-CM | POA: Diagnosis not present

## 2021-08-24 DIAGNOSIS — R296 Repeated falls: Secondary | ICD-10-CM | POA: Diagnosis not present

## 2021-08-24 DIAGNOSIS — R2689 Other abnormalities of gait and mobility: Secondary | ICD-10-CM | POA: Diagnosis not present

## 2021-08-24 DIAGNOSIS — M6281 Muscle weakness (generalized): Secondary | ICD-10-CM | POA: Diagnosis not present

## 2021-08-24 DIAGNOSIS — M5459 Other low back pain: Secondary | ICD-10-CM | POA: Diagnosis not present

## 2021-08-24 DIAGNOSIS — I10 Essential (primary) hypertension: Secondary | ICD-10-CM | POA: Diagnosis not present

## 2021-08-25 ENCOUNTER — Non-Acute Institutional Stay: Payer: Medicare Other | Admitting: Orthopedic Surgery

## 2021-08-25 ENCOUNTER — Encounter: Payer: Self-pay | Admitting: Orthopedic Surgery

## 2021-08-25 DIAGNOSIS — G8929 Other chronic pain: Secondary | ICD-10-CM | POA: Diagnosis not present

## 2021-08-25 DIAGNOSIS — K6289 Other specified diseases of anus and rectum: Secondary | ICD-10-CM | POA: Diagnosis not present

## 2021-08-25 DIAGNOSIS — M545 Low back pain, unspecified: Secondary | ICD-10-CM

## 2021-08-25 MED ORDER — PREDNISONE 10 MG PO TABS: ORAL_TABLET | ORAL | 0 refills | Status: AC

## 2021-08-25 NOTE — Progress Notes (Signed)
?Location:  [facility] ?Nursing Home Room Number: IR678/L ?Place of Service:  ALF (13) ?Provider: Yvonna Alanis, NP ? ?Patient Care Team: ?Virgie Dad, MD as PCP - General (Internal Medicine) ?Elouise Munroe, MD as PCP - Cardiology (Cardiology) ? ?Extended Emergency Contact Information ?Primary Emergency Contact: Weeks,[name] ?Address: [address] ?         [place], [place] [address] [place] ?Home Phone: [phone] ?Mobile Phone: [phone] ?Relation: Daughter ?Secondary Emergency Contact: Mcauliffe,Ginger ?         [place], [place] ?Mobile Phone: [phone] ?Relation: Daughter ? ?Code Status:  Full Code ?Goals of care: Advanced Directive information ?Advanced Directives 08/25/2021  ?Does Patient Have a Medical Advance Directive? Yes  ?Type of Paramedic of [place];Living will  ?Does patient want to make changes to medical advance directive? No - Patient declined  ?Copy of [place] in Chart? Yes - validated most recent copy scanned in chart (See row information)  ?Would patient like information on creating a medical advance directive? -  ? ? ? ?Chief Complaint  ?Patient presents with  ? Acute Visit  ?  Rectal and back pain  ? ? ?HPI:  ?Pt is a 88 y.o. male seen today for an acute visit for rectal and back pain.  ? ?He has had pelvic and right sided lower back pain since mechanical fall 07/2021. X-ray pelvis/sacrum/coccyx/lumbar spine negative for acute fracture or bony abnormality. He is a poor historian due to AD. He is unable to rate pain, states " it comes and goes. " Ambulation has improved some per nursing. He continues to work with PT/OT. No recent falls. Ambulates with walker. Admits to rectal pain as well. Denies hemorrhoids, hard stools or pain with defecation. He is taking senna daily. He reports last BM was today.  ? ? ?Past Medical History:  ?Diagnosis Date  ? Anemia   ? Arthritis   ?  arthritis,osteopenia,"spinal stenosis"  ? Bladder neck obstruction 03/28/2011  ? CAD (coronary artery disease)   ? Cancer [facility]) 12-06-12  ? Prostate cancer'98  ? CKD (chronic kidney disease) stage 3, GFR 30-59 ml/min (HCC) 08/17/2017  ? Depression   ? Essential hypertension, benign 04/10/2009  ? GERD (gastroesophageal reflux disease)   ? GERD without esophagitis 11/05/2016  ? H/O hiatal hernia   ? H/O vertigo 12-06-12  ? none recent  ? Hyperlipidemia   ? IBS (irritable bowel syndrome)   ? Mixed hyperlipidemia 04/10/2009  ? Neuropathy   ? Osteopenia   ? Peripheral neuropathy 12-06-12  ? peripheral neuropathy  ? Rhinitis   ? RLS (restless legs syndrome)   ? Vascular dementia ([place]) 09/30/2019  ? Vitamin B12 deficiency   ? ?Past Surgical History:  ?Procedure Laterality Date  ? APPENDECTOMY    ? CARPAL TUNNEL RELEASE    ? both hands  ? cataract surgery Left 12-06-12  ? recent surgery  ? CHOLECYSTECTOMY    ? COLONOSCOPY WITH PROPOFOL N/A 12/25/2012  ? Procedure: COLONOSCOPY WITH PROPOFOL;  Surgeon: Garlan Fair, MD;  [facility]: [facility];  Service: Endoscopy;  Laterality: N/A;  ? CORONARY STENT INTERVENTION N/A 03/13/2020  ? Procedure: CORONARY STENT INTERVENTION;  Surgeon: Nelva Bush, MD;  Location: [place] [facility];  Service: Cardiovascular;  Laterality: N/A;  ? ESOPHAGOGASTRODUODENOSCOPY (EGD) WITH PROPOFOL N/A 12/25/2012  ? Procedure: ESOPHAGOGASTRODUODENOSCOPY (EGD) WITH PROPOFOL;  Surgeon: Garlan Fair, MD;  [facility];  Service: Endoscopy;  Laterality: N/A;  ? INTRAVASCULAR ULTRASOUND/IVUS N/A 03/13/2020  ? Procedure: Intravascular Ultrasound/IVUS;  Surgeon: Nelva Bush, MD;  Location: [place] [facility];  Service: Cardiovascular;  Laterality: N/A;  ? LEFT HEART CATH AND CORONARY ANGIOGRAPHY N/A 03/13/2020  ? Procedure: LEFT HEART CATH AND CORONARY ANGIOGRAPHY;  Surgeon: Nelva Bush, MD;  Location: [place] [facility];  Service: Cardiovascular;  Laterality: N/A;  ? PROSTATE SURGERY   12-06-12  ? TONSILLECTOMY    ? TOTAL KNEE ARTHROPLASTY Right 11/25/2014  ? Procedure: RIGHT TOTAL KNEE ARTHROPLASTY;  Surgeon: Melrose Nakayama, MD;  Location: [place];  Service: Orthopedics;  Laterality: Right;  ? ? ?Allergies  ?Allergen Reactions  ? Niacin Itching  ? Penicillins Other (See Comments)  ?  "rash" ?  ? Tramadol Itching  ? ? ?Outpatient Encounter Medications as of 08/25/2021  ?Medication Sig  ? acetaminophen (TYLENOL) 500 MG tablet Take 1,000 mg by mouth in the morning and at bedtime.  ? atorvastatin (LIPITOR) 80 MG tablet Take 1 tablet (80 mg total) by mouth daily.  ? Cholecalciferol (VITAMIN D) 50 MCG (2000 UT) tablet Take 1 pill daily.  ? gabapentin (NEURONTIN) 100 MG capsule Take 200 mg by mouth 2 (two) times daily.  ? Iron, Ferrous Sulfate, 325 (65 Fe) MG TABS Take 1 tablet by mouth. **PLEASE GIVE THIS MED WITH FOOD** Mon, Wed, and Fri and Saturday  ? isosorbide mononitrate (IMDUR) 30 MG 24 hr tablet Take 30 mg by mouth daily. Take 1/2 tab =15 mg, oral, Once A Day, **HOLD IF SBP IS LESS THAN 100**  ? lactose free nutrition (BOOST) LIQD Take 237 mLs by mouth daily.  ? nitroGLYCERIN (NITROSTAT) 0.4 MG SL tablet Place 0.4 mg under the tongue every 5 (five) minutes as needed for chest pain. X 3  ? pantoprazole (PROTONIX) 40 MG tablet Take 40 mg by mouth 2 (two) times daily.  ? polyethylene glycol powder (GLYCOLAX/MIRALAX) 17 GM/SCOOP powder Take 17 g by mouth daily as needed for moderate constipation.  ? senna (SENOKOT) 8.6 MG tablet Take 1 tablet (8.6 mg total) by mouth daily.  ? sertraline (ZOLOFT) 100 MG tablet 1 tablet Daily for Mood  ? sertraline (ZOLOFT) 25 MG tablet Take 25 mg by mouth daily. Take with 100 mg tab to total 125 mg daily.  ? ?No facility-administered encounter medications on file as of 08/25/2021.  ? ? ?Review of Systems  ?Unable to perform ROS: Dementia  ? ?Immunization History  ?Administered Date(s) Administered  ? Influenza, High Dose Seasonal PF 03/05/2015, 03/05/2018  ?  Influenza-Unspecified 04/22/2016, 03/13/2017, 06/13/2018, 03/28/2019, 04/01/2021  ? Moderna Sars-Covid-2 Vaccination 06/17/2019, 07/15/2019, 04/21/2020  ? Pneumococcal Conjugate-13 05/23/2017  ? Pneumococcal Polysaccharide-23 01/20/2016  ? Tdap 06/21/2021  ? Unspecified SARS-COV-2 Vaccination 11/10/2020, 03/02/2021  ? Zoster Recombinat (Shingrix) 06/16/2021  ? ?Pertinent  Health Maintenance Due  ?Topic Date Due  ? INFLUENZA VACCINE  Completed  ? ?Fall Risk 03/16/2020 03/16/2020 05/01/2020 05/02/2020 02/13/2021  ?Falls in the past year? - - - - -  ?Was there an injury with Fall? - - - - -  ?Fall Risk Category Calculator - - - - -  ?Fall Risk Category - - - - -  ?Patient Fall Risk Level Moderate fall risk Moderate fall risk High fall risk Moderate fall risk Low fall risk  ?Patient at Risk for Falls Due to - - - - -  ?Fall risk Follow up - - - - -  ? ?Functional Status Survey: ?  ? ?Vitals:  ? 08/25/21  1517  ?BP: 120/60  ?Pulse: 84  ?Resp: 18  ?Temp: 98 ?F (36.7 ?C)  ?SpO2: 94%  ?Weight: 144 lb 6.4 oz (65.5 kg)  ?Height: 6' (1.829 m)  ? ?Body mass index is 19.58 kg/m?Marland Kitchen ?Physical Exam ?Vitals reviewed.  ?Constitutional:   ?   General: He is not in acute distress. ?HENT:  ?   Head: Normocephalic.  ?Eyes:  ?   General:     ?   Right eye: No discharge.     ?   Left eye: No discharge.  ?Cardiovascular:  ?   Rate and Rhythm: Normal rate and regular rhythm.  ?   Pulses: Normal pulses.  ?   Heart sounds: Normal heart sounds.  ?Pulmonary:  ?   Effort: Pulmonary effort is normal. No respiratory distress.  ?   Breath sounds: Normal breath sounds. No wheezing.  ?Abdominal:  ?   General: Bowel sounds are normal. There is no distension.  ?   Palpations: Abdomen is soft.  ?   Tenderness: There is no abdominal tenderness.  ?Genitourinary: ?   Rectum: Normal.  ?   Comments: No visible hemorrhoids, skin excoriated near rectum, small  smears of stool present.  ?Musculoskeletal:  ?   Cervical back: Normal.  ?   Thoracic back: Normal.  ?    Lumbar back: Tenderness present. No swelling or deformity. Normal range of motion.  ?   Right lower leg: No edema.  ?   Left lower leg: No edema.  ?   Comments: Tenderness over right lower back to buttocks, near piriformis  ?Skin: ?   Gener

## 2021-08-26 DIAGNOSIS — I10 Essential (primary) hypertension: Secondary | ICD-10-CM | POA: Diagnosis not present

## 2021-08-26 DIAGNOSIS — R296 Repeated falls: Secondary | ICD-10-CM | POA: Diagnosis not present

## 2021-08-26 DIAGNOSIS — M6281 Muscle weakness (generalized): Secondary | ICD-10-CM | POA: Diagnosis not present

## 2021-08-26 DIAGNOSIS — R2689 Other abnormalities of gait and mobility: Secondary | ICD-10-CM | POA: Diagnosis not present

## 2021-08-26 DIAGNOSIS — M5459 Other low back pain: Secondary | ICD-10-CM | POA: Diagnosis not present

## 2021-08-30 DIAGNOSIS — R2689 Other abnormalities of gait and mobility: Secondary | ICD-10-CM | POA: Diagnosis not present

## 2021-08-30 DIAGNOSIS — M5459 Other low back pain: Secondary | ICD-10-CM | POA: Diagnosis not present

## 2021-08-30 DIAGNOSIS — R296 Repeated falls: Secondary | ICD-10-CM | POA: Diagnosis not present

## 2021-08-30 DIAGNOSIS — M6281 Muscle weakness (generalized): Secondary | ICD-10-CM | POA: Diagnosis not present

## 2021-08-30 DIAGNOSIS — I10 Essential (primary) hypertension: Secondary | ICD-10-CM | POA: Diagnosis not present

## 2021-09-05 DIAGNOSIS — M5459 Other low back pain: Secondary | ICD-10-CM | POA: Diagnosis not present

## 2021-09-05 DIAGNOSIS — I10 Essential (primary) hypertension: Secondary | ICD-10-CM | POA: Diagnosis not present

## 2021-09-05 DIAGNOSIS — R296 Repeated falls: Secondary | ICD-10-CM | POA: Diagnosis not present

## 2021-09-05 DIAGNOSIS — M6281 Muscle weakness (generalized): Secondary | ICD-10-CM | POA: Diagnosis not present

## 2021-09-05 DIAGNOSIS — R2689 Other abnormalities of gait and mobility: Secondary | ICD-10-CM | POA: Diagnosis not present

## 2021-09-07 DIAGNOSIS — R2689 Other abnormalities of gait and mobility: Secondary | ICD-10-CM | POA: Diagnosis not present

## 2021-09-07 DIAGNOSIS — I10 Essential (primary) hypertension: Secondary | ICD-10-CM | POA: Diagnosis not present

## 2021-09-07 DIAGNOSIS — M6281 Muscle weakness (generalized): Secondary | ICD-10-CM | POA: Diagnosis not present

## 2021-09-07 DIAGNOSIS — R296 Repeated falls: Secondary | ICD-10-CM | POA: Diagnosis not present

## 2021-09-07 DIAGNOSIS — M5459 Other low back pain: Secondary | ICD-10-CM | POA: Diagnosis not present

## 2021-09-09 DIAGNOSIS — R296 Repeated falls: Secondary | ICD-10-CM | POA: Diagnosis not present

## 2021-09-09 DIAGNOSIS — M5459 Other low back pain: Secondary | ICD-10-CM | POA: Diagnosis not present

## 2021-09-09 DIAGNOSIS — I10 Essential (primary) hypertension: Secondary | ICD-10-CM | POA: Diagnosis not present

## 2021-09-09 DIAGNOSIS — M6281 Muscle weakness (generalized): Secondary | ICD-10-CM | POA: Diagnosis not present

## 2021-09-09 DIAGNOSIS — R2689 Other abnormalities of gait and mobility: Secondary | ICD-10-CM | POA: Diagnosis not present

## 2021-09-10 ENCOUNTER — Non-Acute Institutional Stay: Payer: Medicare Other | Admitting: Nurse Practitioner

## 2021-09-10 ENCOUNTER — Encounter: Payer: Self-pay | Admitting: Nurse Practitioner

## 2021-09-10 DIAGNOSIS — M25552 Pain in left hip: Secondary | ICD-10-CM | POA: Diagnosis not present

## 2021-09-10 DIAGNOSIS — G309 Alzheimer's disease, unspecified: Secondary | ICD-10-CM | POA: Diagnosis not present

## 2021-09-10 DIAGNOSIS — I351 Nonrheumatic aortic (valve) insufficiency: Secondary | ICD-10-CM

## 2021-09-10 DIAGNOSIS — K5901 Slow transit constipation: Secondary | ICD-10-CM | POA: Diagnosis not present

## 2021-09-10 DIAGNOSIS — K219 Gastro-esophageal reflux disease without esophagitis: Secondary | ICD-10-CM | POA: Diagnosis not present

## 2021-09-10 DIAGNOSIS — F339 Major depressive disorder, recurrent, unspecified: Secondary | ICD-10-CM | POA: Diagnosis not present

## 2021-09-10 DIAGNOSIS — R269 Unspecified abnormalities of gait and mobility: Secondary | ICD-10-CM

## 2021-09-10 DIAGNOSIS — R296 Repeated falls: Secondary | ICD-10-CM | POA: Diagnosis not present

## 2021-09-10 DIAGNOSIS — G609 Hereditary and idiopathic neuropathy, unspecified: Secondary | ICD-10-CM | POA: Diagnosis not present

## 2021-09-10 DIAGNOSIS — I10 Essential (primary) hypertension: Secondary | ICD-10-CM

## 2021-09-10 DIAGNOSIS — F0280 Dementia in other diseases classified elsewhere without behavioral disturbance: Secondary | ICD-10-CM

## 2021-09-10 DIAGNOSIS — D500 Iron deficiency anemia secondary to blood loss (chronic): Secondary | ICD-10-CM | POA: Diagnosis not present

## 2021-09-10 DIAGNOSIS — I2510 Atherosclerotic heart disease of native coronary artery without angina pectoris: Secondary | ICD-10-CM

## 2021-09-10 DIAGNOSIS — R0781 Pleurodynia: Secondary | ICD-10-CM | POA: Diagnosis not present

## 2021-09-10 DIAGNOSIS — N1832 Chronic kidney disease, stage 3b: Secondary | ICD-10-CM | POA: Diagnosis not present

## 2021-09-10 DIAGNOSIS — W19XXXA Unspecified fall, initial encounter: Secondary | ICD-10-CM | POA: Diagnosis not present

## 2021-09-10 NOTE — Assessment & Plan Note (Addendum)
Fall 09/09/21 when the patient stepped away from his walker to open the restroom door, lost balance, c/o pain in the left lower rib cage and hip, able to bear weight or deep breathing. Will obtain X-ray left hip, left ribs. Observe.  ?09/10/21 X-ray ribs no acute bone abnormality.  ?

## 2021-09-10 NOTE — Progress Notes (Signed)
?Location:   [facility] ?Nursing Home Room Number: 907 A ?Place of Service:  ALF (13) ?Provider:  Raquelle Pietro X, NP ? ?Ryan Dad, MD ? ?Patient Care Team: ?Ryan Dad, MD as PCP - General (Internal Medicine) ?Ryan Munroe, MD as PCP - Cardiology (Cardiology) ? ?Extended Emergency Contact Information ?Primary Emergency Contact: Humphrey,Ryan ?Address: [address] ?         [place], [place] [address] [place] ?Home Phone: [phone] ?Mobile Phone: [phone] ?Relation: Daughter ?Secondary Emergency Contact: Humphrey,Ryan ?         [place], [place] ?Mobile Phone: [phone] ?Relation: Daughter ? ?Code Status:  FULL CODE ?Goals of care: Advanced Directive information ? ?  08/25/2021  ?  3:21 PM  ?Advanced Directives  ?Does Patient Have a Medical Advance Directive? Yes  ?Type of Paramedic of [place];Living will  ?Does patient want to make changes to medical advance directive? No - Patient declined  ?Copy of [place] in Chart? Yes - validated most recent copy scanned in chart (See row information)  ? ? ? ?Chief Complaint  ?Patient presents with  ? Acute Visit  ?  Fall, left hip pain  ? ? ?HPI:  ?Pt is a 88 y.o. male seen today for an acute visit for fall 09/09/21 when the patient stepped away from his walker to open the restroom door, lost balance, c/o pain in the left lower rib cage and hip, able to bear weight or deep breathing. On Tylenol ?  ?  ?            Constipation, started Senna 08/04/21, on MiraLax.  ?            Anemia, had Colon and EGD, takes Iron, Hgb 9.6 05/14/2021>>8.1 07/29/21<<8.8 08/03/21, on Iron, FOBT positive x2 ? 2/2 taking Iron, Iron 48.  ?            CAD, NSTEMI stent f/u cardiology. Hospital stay 03/11/20-03/16/20 for NSTEMI, troponin 300s at ED, Cath showed LAD stenosis, underwent drug eluting stent, EF 55-60%, takes Atorvastatin, ASA,, Added Isosorbide '15mg'$  since last chest pain 05/01/20.  02/13/21 ED for chest pain, f/u Cardiology 04/06/21, took him off Plavix.  ?            HTN, controlled, off Metoprolol. ?            GERD, takes Pantoprazole ?            AI, EF 55-60% ?            CKD stage 3, Bun/creat 33/1.66 07/29/21 ?Alzheimer's dementia, didn't tolerate memory meds, underwent neurology eval in the past. TSH 3.97 07/08/21 ?            Peripheral neuropathy, takes Gabapentin ?            Depression takes Sertraline ?            Gait abnormality, risk of falling is high.  ? ?Past Medical History:  ?Diagnosis Date  ? Anemia   ? Arthritis   ? arthritis,osteopenia,"spinal stenosis"  ? Bladder neck obstruction 03/28/2011  ? CAD (coronary artery disease)   ? Cancer [facility]) 12-06-12  ? Prostate cancer'98  ? CKD (chronic kidney disease) stage 3, GFR 30-59 ml/min (HCC) 08/17/2017  ? Depression   ? Essential hypertension, benign 04/10/2009  ? GERD (gastroesophageal reflux disease)   ? GERD without esophagitis 11/05/2016  ? H/O hiatal hernia   ?  H/O vertigo 12-06-12  ? none recent  ? Hyperlipidemia   ? IBS (irritable bowel syndrome)   ? Mixed hyperlipidemia 04/10/2009  ? Neuropathy   ? Osteopenia   ? Peripheral neuropathy 12-06-12  ? peripheral neuropathy  ? Rhinitis   ? RLS (restless legs syndrome)   ? Vascular dementia ([place]) 09/30/2019  ? Vitamin B12 deficiency   ? ?Past Surgical History:  ?Procedure Laterality Date  ? APPENDECTOMY    ? CARPAL TUNNEL RELEASE    ? both hands  ? cataract surgery Left 12-06-12  ? recent surgery  ? CHOLECYSTECTOMY    ? COLONOSCOPY WITH PROPOFOL N/A 12/25/2012  ? Procedure: COLONOSCOPY WITH PROPOFOL;  Surgeon: Garlan Fair, MD;  [facility];  Service: Endoscopy;  Laterality: N/A;  ? CORONARY STENT INTERVENTION N/A 03/13/2020  ? Procedure: CORONARY STENT INTERVENTION;  Surgeon: Nelva Bush, MD;  Location: [place] [facility];  Service: Cardiovascular;  Laterality: N/A;  ? ESOPHAGOGASTRODUODENOSCOPY (EGD) WITH PROPOFOL N/A 12/25/2012  ? Procedure: ESOPHAGOGASTRODUODENOSCOPY  (EGD) WITH PROPOFOL;  Surgeon: Garlan Fair, MD;  [facility];  Service: Endoscopy;  Laterality: N/A;  ? INTRAVASCULAR ULTRASOUND/IVUS N/A 03/13/2020  ? Procedure: Intravascular Ultrasound/IVUS;  Surgeon: Nelva Bush, MD;  Location: [place] [facility];  Service: Cardiovascular;  Laterality: N/A;  ? LEFT HEART CATH AND CORONARY ANGIOGRAPHY N/A 03/13/2020  ? Procedure: LEFT HEART CATH AND CORONARY ANGIOGRAPHY;  Surgeon: Nelva Bush, MD;  Location: [place] [facility];  Service: Cardiovascular;  Laterality: N/A;  ? PROSTATE SURGERY  12-06-12  ? TONSILLECTOMY    ? TOTAL KNEE ARTHROPLASTY Right 11/25/2014  ? Procedure: RIGHT TOTAL KNEE ARTHROPLASTY;  Surgeon: Melrose Nakayama, MD;  Location: [place];  Service: Orthopedics;  Laterality: Right;  ? ? ?Allergies  ?Allergen Reactions  ? Niacin Itching  ? Penicillins Other (See Comments)  ?  "rash" ?  ? Tramadol Itching  ? ? ?Allergies as of 09/10/2021   ? ?   Reactions  ? Niacin Itching  ? Penicillins Other (See Comments)  ? "rash"  ? Tramadol Itching  ? ?  ? ?  ?Medication List  ?  ? ?  ? Accurate as of September 10, 2021 11:59 PM. If you have any questions, ask your nurse or doctor.  ?  ?  ? ?  ? ?acetaminophen 500 MG tablet ?Commonly known as: TYLENOL ?Take 1,000 mg by mouth in the morning and at bedtime. ?  ?atorvastatin 80 MG tablet ?Commonly known as: LIPITOR ?Take 1 tablet (80 mg total) by mouth daily. ?  ?gabapentin 100 MG capsule ?Commonly known as: NEURONTIN ?Take 200 mg by mouth 2 (two) times daily. ?  ?Iron (Ferrous Sulfate) 325 (65 Fe) MG Tabs ?Take 1 tablet by mouth. **PLEASE GIVE THIS MED WITH FOOD** Mon, Wed, and Fri and Saturday ?  ?isosorbide mononitrate 30 MG 24 hr tablet ?Commonly known as: IMDUR ?Take 30 mg by mouth daily. Take 1/2 tab =15 mg, oral, Once A Day, **HOLD IF SBP IS LESS THAN 100** ?  ?lactose free nutrition Liqd ?Take 237 mLs by mouth daily. ?  ?nitroGLYCERIN 0.4 MG SL tablet ?Commonly known as: NITROSTAT ?Place 0.4 mg under the  tongue every 5 (five) minutes as needed for chest pain. X 3 ?  ?pantoprazole 40 MG tablet ?Commonly known as: PROTONIX ?Take 40 mg by mouth 2 (two) times daily. ?  ?polyethylene glycol powder 17 GM/SCOOP powder ?Commonly known as: GLYCOLAX/MIRALAX ?Take 17 g by mouth daily as needed for moderate constipation. ?  ?  senna 8.6 MG tablet ?Commonly known as: SENOKOT ?Take 1 tablet (8.6 mg total) by mouth daily. ?  ?sertraline 25 MG tablet ?Commonly known as: ZOLOFT ?Take 25 mg by mouth daily. Take with 100 mg tab to total 125 mg daily. ?  ?sertraline 100 MG tablet ?Commonly known as: ZOLOFT ?1 tablet Daily for Mood ?  ?Vitamin D 50 MCG (2000 UT) tablet ?Take 1 pill daily. ?  ? ?  ? ? ?Review of Systems  ?Unable to perform ROS: Dementia  ? ?Immunization History  ?Administered Date(s) Administered  ? Influenza, High Dose Seasonal PF 03/05/2015, 03/05/2018  ? Influenza-Unspecified 04/22/2016, 03/13/2017, 06/13/2018, 03/28/2019, 04/01/2021  ? Moderna Sars-Covid-2 Vaccination 06/17/2019, 07/15/2019, 04/21/2020  ? Pneumococcal Conjugate-13 05/23/2017  ? Pneumococcal Polysaccharide-23 01/20/2016  ? Tdap 06/21/2021  ? Unspecified SARS-COV-2 Vaccination 11/10/2020, 03/02/2021  ? Zoster Recombinat (Shingrix) 06/16/2021  ? ?Pertinent  Health Maintenance Due  ?Topic Date Due  ? INFLUENZA VACCINE  01/11/2022  ? ? ?  03/16/2020  ?  8:15 AM 03/16/2020  ?  8:30 PM 05/01/2020  ? 10:41 PM 05/02/2020  ?  4:48 AM 02/13/2021  ?  4:54 AM  ?Fall Risk  ?Patient Fall Risk Level Moderate fall risk Moderate fall risk High fall risk Moderate fall risk Low fall risk  ? ?Functional Status Survey: ?  ? ?Vitals:  ? 09/10/21 1054  ?BP: 122/70  ?Pulse: 88  ?Resp: 20  ?Temp: 98.3 ?F (36.8 ?C)  ?SpO2: 98%  ?Weight: 144 lb 6.4 oz (65.5 kg)  ?Height: 6' (1.829 m)  ? ?Body mass index is 19.58 kg/m?Marland Kitchen ?Physical Exam ?Vitals and nursing note reviewed.  ?Constitutional:   ?   Comments: fatigue  ?HENT:  ?   Head: Normocephalic and atraumatic.  ?   Mouth/Throat:  ?    Mouth: Mucous membranes are moist.  ?Eyes:  ?   Extraocular Movements: Extraocular movements intact.  ?   Conjunctiva/sclera: Conjunctivae normal.  ?   Pupils: Pupils are equal, round, and reactive to ligh

## 2021-09-13 ENCOUNTER — Encounter: Payer: Self-pay | Admitting: Nurse Practitioner

## 2021-09-13 DIAGNOSIS — I351 Nonrheumatic aortic (valve) insufficiency: Secondary | ICD-10-CM | POA: Insufficient documentation

## 2021-09-13 DIAGNOSIS — W19XXXA Unspecified fall, initial encounter: Secondary | ICD-10-CM | POA: Diagnosis not present

## 2021-09-13 DIAGNOSIS — R0781 Pleurodynia: Secondary | ICD-10-CM | POA: Diagnosis not present

## 2021-09-13 DIAGNOSIS — M25552 Pain in left hip: Secondary | ICD-10-CM | POA: Diagnosis not present

## 2021-09-13 NOTE — Assessment & Plan Note (Signed)
NSTEMI stent f/u cardiology. Hospital stay 03/11/20-03/16/20 for NSTEMI, troponin 300s at ED, Cath showed LAD stenosis, underwent drug eluting stent, EF 55-60%, takes Atorvastatin, ASA,, Added Isosorbide '15mg'$  since last chest pain 05/01/20. 02/13/21 ED for chest pain, f/u Cardiology 04/06/21, took him off Plavix.  ?

## 2021-09-13 NOTE — Assessment & Plan Note (Signed)
risk of falling is high.  ? ?

## 2021-09-13 NOTE — Assessment & Plan Note (Signed)
His mood is stable, takes Sertraline ?

## 2021-09-13 NOTE — Assessment & Plan Note (Signed)
EF 55-60% 

## 2021-09-13 NOTE — Assessment & Plan Note (Signed)
Blood pressure is controlled, off Metoprolol. 

## 2021-09-13 NOTE — Assessment & Plan Note (Signed)
takes Gabapentin  

## 2021-09-13 NOTE — Assessment & Plan Note (Signed)
started Senna 08/04/21, on MiraLax.  ?

## 2021-09-13 NOTE — Assessment & Plan Note (Signed)
Bun/creat 33/1.66 07/29/21 

## 2021-09-13 NOTE — Assessment & Plan Note (Signed)
didn't tolerate memory meds, underwent neurology eval in the past. TSH 3.97 07/08/21 

## 2021-09-13 NOTE — Assessment & Plan Note (Signed)
had Colon and EGD, takes Iron, Hgb 9.6 06/03/2021>>8.1 07/29/21<<8.8 08/03/21, on Iron, FOBT positive x2 ? 2/2 taking Iron, Iron 48.  ?

## 2021-09-13 NOTE — Assessment & Plan Note (Signed)
Stable, takes Pantoprazole 

## 2021-09-14 DIAGNOSIS — I10 Essential (primary) hypertension: Secondary | ICD-10-CM | POA: Diagnosis not present

## 2021-09-14 DIAGNOSIS — R2689 Other abnormalities of gait and mobility: Secondary | ICD-10-CM | POA: Diagnosis not present

## 2021-09-14 DIAGNOSIS — R296 Repeated falls: Secondary | ICD-10-CM | POA: Diagnosis not present

## 2021-09-14 DIAGNOSIS — M5459 Other low back pain: Secondary | ICD-10-CM | POA: Diagnosis not present

## 2021-09-16 DIAGNOSIS — R2689 Other abnormalities of gait and mobility: Secondary | ICD-10-CM | POA: Diagnosis not present

## 2021-09-16 DIAGNOSIS — I10 Essential (primary) hypertension: Secondary | ICD-10-CM | POA: Diagnosis not present

## 2021-09-16 DIAGNOSIS — R296 Repeated falls: Secondary | ICD-10-CM | POA: Diagnosis not present

## 2021-09-16 DIAGNOSIS — M5459 Other low back pain: Secondary | ICD-10-CM | POA: Diagnosis not present

## 2021-09-20 DIAGNOSIS — I10 Essential (primary) hypertension: Secondary | ICD-10-CM | POA: Diagnosis not present

## 2021-09-20 DIAGNOSIS — R2689 Other abnormalities of gait and mobility: Secondary | ICD-10-CM | POA: Diagnosis not present

## 2021-09-20 DIAGNOSIS — R296 Repeated falls: Secondary | ICD-10-CM | POA: Diagnosis not present

## 2021-09-20 DIAGNOSIS — M5459 Other low back pain: Secondary | ICD-10-CM | POA: Diagnosis not present

## 2021-09-23 DIAGNOSIS — I10 Essential (primary) hypertension: Secondary | ICD-10-CM | POA: Diagnosis not present

## 2021-09-23 DIAGNOSIS — R2689 Other abnormalities of gait and mobility: Secondary | ICD-10-CM | POA: Diagnosis not present

## 2021-09-23 DIAGNOSIS — M5459 Other low back pain: Secondary | ICD-10-CM | POA: Diagnosis not present

## 2021-09-23 DIAGNOSIS — R296 Repeated falls: Secondary | ICD-10-CM | POA: Diagnosis not present

## 2021-09-27 DIAGNOSIS — M5459 Other low back pain: Secondary | ICD-10-CM | POA: Diagnosis not present

## 2021-09-27 DIAGNOSIS — R2689 Other abnormalities of gait and mobility: Secondary | ICD-10-CM | POA: Diagnosis not present

## 2021-09-27 DIAGNOSIS — R296 Repeated falls: Secondary | ICD-10-CM | POA: Diagnosis not present

## 2021-09-27 DIAGNOSIS — Z20822 Contact with and (suspected) exposure to covid-19: Secondary | ICD-10-CM | POA: Diagnosis not present

## 2021-09-27 DIAGNOSIS — I10 Essential (primary) hypertension: Secondary | ICD-10-CM | POA: Diagnosis not present

## 2021-09-29 DIAGNOSIS — M5459 Other low back pain: Secondary | ICD-10-CM | POA: Diagnosis not present

## 2021-09-29 DIAGNOSIS — R296 Repeated falls: Secondary | ICD-10-CM | POA: Diagnosis not present

## 2021-09-29 DIAGNOSIS — R2689 Other abnormalities of gait and mobility: Secondary | ICD-10-CM | POA: Diagnosis not present

## 2021-09-29 DIAGNOSIS — I10 Essential (primary) hypertension: Secondary | ICD-10-CM | POA: Diagnosis not present

## 2021-10-05 DIAGNOSIS — R296 Repeated falls: Secondary | ICD-10-CM | POA: Diagnosis not present

## 2021-10-05 DIAGNOSIS — I10 Essential (primary) hypertension: Secondary | ICD-10-CM | POA: Diagnosis not present

## 2021-10-05 DIAGNOSIS — M5459 Other low back pain: Secondary | ICD-10-CM | POA: Diagnosis not present

## 2021-10-05 DIAGNOSIS — R2689 Other abnormalities of gait and mobility: Secondary | ICD-10-CM | POA: Diagnosis not present

## 2021-10-07 DIAGNOSIS — I10 Essential (primary) hypertension: Secondary | ICD-10-CM | POA: Diagnosis not present

## 2021-10-07 DIAGNOSIS — R296 Repeated falls: Secondary | ICD-10-CM | POA: Diagnosis not present

## 2021-10-07 DIAGNOSIS — M5459 Other low back pain: Secondary | ICD-10-CM | POA: Diagnosis not present

## 2021-10-07 DIAGNOSIS — R2689 Other abnormalities of gait and mobility: Secondary | ICD-10-CM | POA: Diagnosis not present

## 2021-10-12 DIAGNOSIS — Z20822 Contact with and (suspected) exposure to covid-19: Secondary | ICD-10-CM | POA: Diagnosis not present

## 2021-10-12 DIAGNOSIS — I10 Essential (primary) hypertension: Secondary | ICD-10-CM | POA: Diagnosis not present

## 2021-10-12 DIAGNOSIS — R296 Repeated falls: Secondary | ICD-10-CM | POA: Diagnosis not present

## 2021-10-12 DIAGNOSIS — M5459 Other low back pain: Secondary | ICD-10-CM | POA: Diagnosis not present

## 2021-10-12 DIAGNOSIS — R2689 Other abnormalities of gait and mobility: Secondary | ICD-10-CM | POA: Diagnosis not present

## 2021-10-14 DIAGNOSIS — I10 Essential (primary) hypertension: Secondary | ICD-10-CM | POA: Diagnosis not present

## 2021-10-14 DIAGNOSIS — R296 Repeated falls: Secondary | ICD-10-CM | POA: Diagnosis not present

## 2021-10-14 DIAGNOSIS — M5459 Other low back pain: Secondary | ICD-10-CM | POA: Diagnosis not present

## 2021-10-14 DIAGNOSIS — R2689 Other abnormalities of gait and mobility: Secondary | ICD-10-CM | POA: Diagnosis not present

## 2021-10-18 ENCOUNTER — Non-Acute Institutional Stay: Payer: Medicare Other | Admitting: Adult Health

## 2021-10-18 ENCOUNTER — Encounter: Payer: Self-pay | Admitting: Adult Health

## 2021-10-18 DIAGNOSIS — M5459 Other low back pain: Secondary | ICD-10-CM | POA: Diagnosis not present

## 2021-10-18 DIAGNOSIS — R2689 Other abnormalities of gait and mobility: Secondary | ICD-10-CM | POA: Diagnosis not present

## 2021-10-18 DIAGNOSIS — R296 Repeated falls: Secondary | ICD-10-CM | POA: Diagnosis not present

## 2021-10-18 DIAGNOSIS — M25552 Pain in left hip: Secondary | ICD-10-CM

## 2021-10-18 DIAGNOSIS — I2510 Atherosclerotic heart disease of native coronary artery without angina pectoris: Secondary | ICD-10-CM

## 2021-10-18 DIAGNOSIS — I10 Essential (primary) hypertension: Secondary | ICD-10-CM | POA: Diagnosis not present

## 2021-10-18 NOTE — Progress Notes (Signed)
? ?Location:  [facility] ?Nursing Home Room Number: 907-A ?Place of Service:  ALF (13) ?Provider:  Durenda Age, DNP, FNP-BC ? ?Patient Care Team: ?Virgie Dad, MD as PCP - General (Internal Medicine) ?Elouise Munroe, MD as PCP - Cardiology (Cardiology) ? ?Extended Emergency Contact Information ?Primary Emergency Contact: Weeks,[name] ?Address: [address] ?         [place], [place] [address] [place] ?Home Phone: [phone] ?Mobile Phone: [phone] ?Relation: Daughter ?Secondary Emergency Contact: Whittley,Ginger ?         [place], [place] ?Mobile Phone: [phone] ?Relation: Daughter ? ?Code Status:  Full Code ? ?Goals of care: Advanced Directive information ? ?  10/18/2021  ?  3:18 PM  ?Advanced Directives  ?Does Patient Have a Medical Advance Directive? Yes  ?Type of Paramedic of [place];Living will  ?Does patient want to make changes to medical advance directive? No - Patient declined  ?Copy of [place] in Chart? Yes - validated most recent copy scanned in chart (See row information)  ? ? ? ?Chief Complaint  ?Patient presents with  ? Acute Visit  ?  Left hip pain  ? ? ?HPI:  ?Pt is a 88 y.o. male seen today for an acute visit regarding left hip pain. He said that his pain is moderate when he walks. He uses a walker when ambulating. He said that he is able to sleep without waking up at night due to pain. No reported falls/trauma. ? ? ?Past Medical History:  ?Diagnosis Date  ? Anemia   ? Arthritis   ? arthritis,osteopenia,"spinal stenosis"  ? Bladder neck obstruction 03/28/2011  ? CAD (coronary artery disease)   ? Cancer [facility]) 12-06-12  ? Prostate cancer'98  ? CKD (chronic kidney disease) stage 3, GFR 30-59 ml/min (HCC) 08/17/2017  ? Depression   ? Essential hypertension, benign 04/10/2009  ? GERD (gastroesophageal reflux disease)   ? GERD without esophagitis 11/05/2016  ? H/O hiatal hernia   ? H/O  vertigo 12-06-12  ? none recent  ? Hyperlipidemia   ? IBS (irritable bowel syndrome)   ? Mixed hyperlipidemia 04/10/2009  ? Neuropathy   ? Osteopenia   ? Peripheral neuropathy 12-06-12  ? peripheral neuropathy  ? Rhinitis   ? RLS (restless legs syndrome)   ? Vascular dementia ([place]) 09/30/2019  ? Vitamin B12 deficiency   ? ?Past Surgical History:  ?Procedure Laterality Date  ? APPENDECTOMY    ? CARPAL TUNNEL RELEASE    ? both hands  ? cataract surgery Left 12-06-12  ? recent surgery  ? CHOLECYSTECTOMY    ? COLONOSCOPY WITH PROPOFOL N/A 12/25/2012  ? Procedure: COLONOSCOPY WITH PROPOFOL;  Surgeon: Garlan Fair, MD;  Location: [facility];  Service: Endoscopy;  Laterality: N/A;  ? CORONARY STENT INTERVENTION N/A 03/13/2020  ? Procedure: CORONARY STENT INTERVENTION;  Surgeon: Nelva Bush, MD;  Location: [facility] CV LAB;  Service: Cardiovascular;  Laterality: N/A;  ? ESOPHAGOGASTRODUODENOSCOPY (EGD) WITH PROPOFOL N/A 12/25/2012  ? Procedure: ESOPHAGOGASTRODUODENOSCOPY (EGD) WITH PROPOFOL;  Surgeon: Garlan Fair, MD;  [facility];  Service: Endoscopy;  Laterality: N/A;  ? INTRAVASCULAR ULTRASOUND/IVUS N/A 03/13/2020  ? Procedure: Intravascular Ultrasound/IVUS;  Surgeon: Nelva Bush, MD;  Location: [place] CV LAB;  Service: Cardiovascular;  Laterality: N/A;  ? LEFT HEART CATH AND CORONARY ANGIOGRAPHY N/A 03/13/2020  ? Procedure: LEFT HEART CATH AND CORONARY ANGIOGRAPHY;  Surgeon: Nelva Bush, MD;  Location: [facility]  CV LAB;  Service: Cardiovascular;  Laterality: N/A;  ? PROSTATE SURGERY  12-06-12  ? TONSILLECTOMY    ? TOTAL KNEE ARTHROPLASTY Right 11/25/2014  ? Procedure: RIGHT TOTAL KNEE ARTHROPLASTY;  Surgeon: Melrose Nakayama, MD;  Location: [facility];  Service: Orthopedics;  Laterality: Right;  ? ? ?Allergies  ?Allergen Reactions  ? Niacin Itching  ? Penicillins Other (See Comments)  ?  "rash" ?  ? Tramadol Itching  ? ? ?Outpatient Encounter Medications as of 10/18/2021  ?Medication Sig  ?  acetaminophen (TYLENOL) 500 MG tablet Take 1,000 mg by mouth in the morning and at bedtime.  ? atorvastatin (LIPITOR) 80 MG tablet Take 1 tablet (80 mg total) by mouth daily.  ? Cholecalciferol (VITAMIN D) 50 MCG (2000 UT) tablet Take 1 pill daily.  ? gabapentin (NEURONTIN) 100 MG capsule Take 200 mg by mouth 2 (two) times daily.  ? Iron, Ferrous Sulfate, 325 (65 Fe) MG TABS Take 1 tablet by mouth. **PLEASE GIVE THIS MED WITH FOOD** Mon, Wed, and Fri and Saturday  ? isosorbide mononitrate (IMDUR) 30 MG 24 hr tablet Take 30 mg by mouth daily. Take 1/2 tab =15 mg, oral, Once A Day, **HOLD IF SBP IS LESS THAN 100**  ? lactose free nutrition (BOOST) LIQD Take 237 mLs by mouth daily.  ? lidocaine 4 % Place 1 patch onto the skin daily. APPLY TO LEFT HIP  ? nitroGLYCERIN (NITROSTAT) 0.4 MG SL tablet Place 0.4 mg under the tongue every 5 (five) minutes as needed for chest pain. X 3  ? pantoprazole (PROTONIX) 40 MG tablet Take 40 mg by mouth 2 (two) times daily.  ? polyethylene glycol powder (GLYCOLAX/MIRALAX) 17 GM/SCOOP powder Take 17 g by mouth daily as needed for moderate constipation.  ? senna (SENOKOT) 8.6 MG tablet Take 1 tablet (8.6 mg total) by mouth daily.  ? sertraline (ZOLOFT) 100 MG tablet 1 tablet Daily for Mood  ? sertraline (ZOLOFT) 25 MG tablet Take 25 mg by mouth daily. Take with 100 mg tab to total 125 mg daily.  ? vitamin B-12 (CYANOCOBALAMIN) 1000 MCG tablet Take 1,000 mcg by mouth daily.  ? ?No facility-administered encounter medications on file as of 10/18/2021.  ? ? ?Review of Systems  ?Constitutional:  Negative for activity change, appetite change and fever.  ?HENT:  Negative for sore throat.   ?Eyes: Negative.   ?Cardiovascular:  Negative for chest pain and leg swelling.  ?Gastrointestinal:  Negative for abdominal distention, diarrhea and vomiting.  ?Genitourinary:  Negative for dysuria, frequency and urgency.  ?Musculoskeletal:   ?     Left hip pain when ambulating  ?Skin:  Negative for color change.   ?Neurological:  Negative for dizziness and headaches.  ?Psychiatric/Behavioral:  Negative for behavioral problems and sleep disturbance. The patient is not nervous/anxious.    ? ? ? ?Immunization History  ?Administered Date(s) Administered  ? Influenza, High Dose Seasonal PF 03/05/2015, 03/05/2018  ? Influenza-Unspecified 04/22/2016, 03/13/2017, 06/13/2018, 03/28/2019, 04/01/2021  ? Moderna Sars-Covid-2 Vaccination 06/17/2019, 07/15/2019, 04/21/2020  ? Pneumococcal Conjugate-13 05/23/2017  ? Pneumococcal Polysaccharide-23 01/20/2016  ? Tdap 06/21/2021  ? Unspecified SARS-COV-2 Vaccination 11/10/2020, 03/02/2021  ? Zoster Recombinat (Shingrix) 06/16/2021  ? ?Pertinent  Health Maintenance Due  ?Topic Date Due  ? INFLUENZA VACCINE  01/11/2022  ? ? ?  03/16/2020  ?  8:15 AM 03/16/2020  ?  8:30 PM 05/01/2020  ? 10:41 PM 05/02/2020  ?  4:48 AM 02/13/2021  ?  4:54 AM  ?Fall Risk  ?Patient Fall  Risk Level Moderate fall risk Moderate fall risk High fall risk Moderate fall risk Low fall risk  ? ? ? ?Vitals:  ? 10/18/21 1511  ?BP: 100/60  ?Pulse: 77  ?Resp: 18  ?Temp: 97.6 ?F (36.4 ?C)  ?SpO2: 98%  ?Weight: 147 lb 3.2 oz (66.8 kg)  ?Height: 6' (1.829 m)  ? ?Body mass index is 19.96 kg/m?. ? ?Physical Exam ?Constitutional:   ?   Appearance: Normal appearance.  ?HENT:  ?   Head: Normocephalic and atraumatic.  ?   Mouth/Throat:  ?   Mouth: Mucous membranes are moist.  ?Eyes:  ?   Conjunctiva/sclera: Conjunctivae normal.  ?Cardiovascular:  ?   Rate and Rhythm: Normal rate and regular rhythm.  ?   Pulses: Normal pulses.  ?   Heart sounds: Normal heart sounds.  ?Pulmonary:  ?   Effort: Pulmonary effort is normal.  ?   Breath sounds: Normal breath sounds.  ?Abdominal:  ?   General: Bowel sounds are normal.  ?   Palpations: Abdomen is soft.  ?Musculoskeletal:     ?   General: No swelling. Normal range of motion.  ?   Cervical back: Normal range of motion.  ?Skin: ?   General: Skin is warm and dry.  ?Neurological:  ?   General: No focal  deficit present.  ?   Mental Status: He is alert.  ?   Comments: Alert to self and place, disoriented to time.  ?Psychiatric:     ?   Mood and Affect: Mood normal.     ?   Behavior: Behavior normal.  ?  ? ? ?

## 2021-10-19 DIAGNOSIS — I10 Essential (primary) hypertension: Secondary | ICD-10-CM | POA: Diagnosis not present

## 2021-10-19 DIAGNOSIS — M5459 Other low back pain: Secondary | ICD-10-CM | POA: Diagnosis not present

## 2021-10-19 DIAGNOSIS — R296 Repeated falls: Secondary | ICD-10-CM | POA: Diagnosis not present

## 2021-10-19 DIAGNOSIS — R2689 Other abnormalities of gait and mobility: Secondary | ICD-10-CM | POA: Diagnosis not present

## 2021-10-20 DIAGNOSIS — I10 Essential (primary) hypertension: Secondary | ICD-10-CM | POA: Diagnosis not present

## 2021-10-20 DIAGNOSIS — R2689 Other abnormalities of gait and mobility: Secondary | ICD-10-CM | POA: Diagnosis not present

## 2021-10-20 DIAGNOSIS — R296 Repeated falls: Secondary | ICD-10-CM | POA: Diagnosis not present

## 2021-10-20 DIAGNOSIS — M5459 Other low back pain: Secondary | ICD-10-CM | POA: Diagnosis not present

## 2021-10-21 DIAGNOSIS — R296 Repeated falls: Secondary | ICD-10-CM | POA: Diagnosis not present

## 2021-10-21 DIAGNOSIS — M5459 Other low back pain: Secondary | ICD-10-CM | POA: Diagnosis not present

## 2021-10-21 DIAGNOSIS — R2689 Other abnormalities of gait and mobility: Secondary | ICD-10-CM | POA: Diagnosis not present

## 2021-10-21 DIAGNOSIS — I10 Essential (primary) hypertension: Secondary | ICD-10-CM | POA: Diagnosis not present

## 2021-10-29 DIAGNOSIS — Z23 Encounter for immunization: Secondary | ICD-10-CM | POA: Diagnosis not present

## 2021-11-29 ENCOUNTER — Emergency Department (HOSPITAL_COMMUNITY): Payer: Medicare Other

## 2021-11-29 ENCOUNTER — Emergency Department (HOSPITAL_COMMUNITY)
Admission: EM | Admit: 2021-11-29 | Discharge: 2021-11-29 | Disposition: A | Discharge status: Nursing Home (Includes ICF) | Payer: Medicare Other | Attending: Emergency Medicine | Admitting: Emergency Medicine

## 2021-11-29 ENCOUNTER — Other Ambulatory Visit: Payer: Self-pay

## 2021-11-29 ENCOUNTER — Encounter (HOSPITAL_COMMUNITY): Payer: Self-pay

## 2021-11-29 DIAGNOSIS — W19XXXA Unspecified fall, initial encounter: Secondary | ICD-10-CM | POA: Diagnosis not present

## 2021-11-29 DIAGNOSIS — R102 Pelvic and perineal pain: Secondary | ICD-10-CM | POA: Diagnosis not present

## 2021-11-29 DIAGNOSIS — S7012XA Contusion of left thigh, initial encounter: Secondary | ICD-10-CM | POA: Insufficient documentation

## 2021-11-29 DIAGNOSIS — W1839XA Other fall on same level, initial encounter: Secondary | ICD-10-CM | POA: Insufficient documentation

## 2021-11-29 DIAGNOSIS — M25552 Pain in left hip: Secondary | ICD-10-CM | POA: Diagnosis not present

## 2021-11-29 DIAGNOSIS — M25559 Pain in unspecified hip: Secondary | ICD-10-CM | POA: Diagnosis not present

## 2021-11-29 DIAGNOSIS — S79922A Unspecified injury of left thigh, initial encounter: Secondary | ICD-10-CM | POA: Diagnosis present

## 2021-11-29 DIAGNOSIS — R296 Repeated falls: Secondary | ICD-10-CM | POA: Diagnosis not present

## 2021-11-29 NOTE — ED Provider Notes (Signed)
[facility] DEPT Provider Note   CSN: [id number] Arrival date & time: 11/29/21  0645     History  Chief Complaint  Patient presents with   Hip Pain    Ryan Humphrey is a 88 y.o. male.  88 year old male with a chief complaints of a fall.  This is at least what was reported by EMS.  The patient does not remember even having a fall.  Denies any pain anywhere.  It was told that he had a bruise on his left leg.  He has not noticed a bruise.  He denies chest pain headache neck pain back pain abdominal pain extremity pain.   Hip Pain       Home Medications Prior to Admission medications   Medication Sig Start Date End Date Taking? Authorizing Provider  acetaminophen (TYLENOL) 500 MG tablet Take 1,000 mg by mouth in the morning and at bedtime.    [provider]  atorvastatin (LIPITOR) 80 MG tablet Take 1 tablet (80 mg total) by mouth daily. 03/17/20   Leanor Kail, PA  Cholecalciferol (VITAMIN D) 50 MCG (2000 UT) tablet Take 1 pill daily. 01/10/19   Unk Pinto, MD  gabapentin (NEURONTIN) 100 MG capsule Take 200 mg by mouth 2 (two) times daily.    [provider]  Iron, Ferrous Sulfate, 325 (65 Fe) MG TABS Take 1 tablet by mouth. **PLEASE GIVE THIS MED WITH FOOD** Mon, Wed, and Fri and Saturday    [provider]  isosorbide mononitrate (IMDUR) 30 MG 24 hr tablet Take 30 mg by mouth daily. Take 1/2 tab =15 mg, oral, Once A Day, **HOLD IF SBP IS LESS THAN 100**    [provider]  lactose free nutrition (BOOST) LIQD Take 237 mLs by mouth daily.    [provider]  lidocaine 4 % Place 1 patch onto the skin daily. APPLY TO LEFT HIP    [provider]  nitroGLYCERIN (NITROSTAT) 0.4 MG SL tablet Place 0.4 mg under the tongue every 5 (five) minutes as needed for chest pain. X 3    [provider]  pantoprazole (PROTONIX) 40 MG tablet Take 40 mg by mouth 2 (two) times daily.    [provider]  polyethylene glycol powder (GLYCOLAX/MIRALAX) 17 GM/SCOOP powder Take 17 g by mouth daily as needed for moderate constipation. 08/04/21   Yvonna Alanis, NP  senna (SENOKOT) 8.6 MG tablet Take 1 tablet (8.6 mg total) by mouth daily. 08/04/21   Yvonna Alanis, NP  sertraline (ZOLOFT) 100 MG tablet 1 tablet Daily for Mood 09/10/19 03/30/30  Unk Pinto, MD  sertraline (ZOLOFT) 25 MG tablet Take 25 mg by mouth daily. Take with 100 mg tab to total 125 mg daily.    [provider]  vitamin B-12 (CYANOCOBALAMIN) 1000 MCG tablet Take 1,000 mcg by mouth daily.    [provider]      Allergies    Niacin, Penicillins, and Tramadol    Review of Systems   Review of Systems  Physical Exam Updated Vital Signs BP (!) 116/57 (BP Location: Right Arm)   Pulse 72   Temp 97.8 F (36.6 C) (Oral)   Resp 16   SpO2 100%  Physical Exam Vitals and nursing note reviewed.  Constitutional:      Appearance: He is well-developed.  HENT:     Head: Normocephalic and atraumatic.  Eyes:     Pupils: Pupils are equal, round, and reactive to light.  Neck:  Vascular: No JVD.  Cardiovascular:     Rate and Rhythm: Normal rate and regular rhythm.     Heart sounds: No murmur heard.    No friction rub. No gallop.  Pulmonary:     Effort: No respiratory distress.     Breath sounds: No wheezing.  Abdominal:     General: There is no distension.     Tenderness: There is no abdominal tenderness. There is no guarding or rebound.  Musculoskeletal:        General: Normal range of motion.     Cervical back: Normal range of motion and neck supple.     Comments: Old appearing bruise to the left lateral thigh.  No appreciable bony tenderness.  Pulse motor and sensation intact distally bilaterally.  Able to internally and actually rotate the hips bilaterally without pain.  No pain with compression of the pelvis.  Palpated from head to toe without other noted areas of bony tenderness.  Skin:     Coloration: Skin is not pale.     Findings: No rash.  Neurological:     Mental Status: He is alert and oriented to person, place, and time.  Psychiatric:        Behavior: Behavior normal.     ED Results / Procedures / Treatments   Labs (all labs ordered are listed, but only abnormal results are displayed) Labs Reviewed - No data to display  EKG None  Radiology No results found.  Procedures Procedures    Medications Ordered in ED Medications - No data to display  ED Course/ Medical Decision Making/ A&P                           Medical Decision Making Amount and/or Complexity of Data Reviewed Radiology: ordered.   88 yo M with a chief complaints of fall.  This was reported by the nursing home to EMS.  Patient does not remember falling.  Denies any specific complaint.  There is no signs of head trauma.  He has an old appearing bruise to the left lateral thigh.  We will obtain a screening film of the pelvis.  Plain film of the pelvis independently interpreted by me without fracture.  Patient was reassessed and continues not to have a complaint.  Will discharge.  7:58 AM:  I have discussed the diagnosis/risks/treatment options with the patient.  Evaluation and diagnostic testing in the emergency department does not suggest an emergent condition requiring admission or immediate intervention beyond what has been performed at this time.  They will follow up with  PCP. We also discussed returning to the ED immediately if new or worsening sx occur. We discussed the sx which are most concerning (e.g., sudden worsening pain, fever, inability to tolerate by mouth) that necessitate immediate return. Medications administered to the patient during their visit and any new prescriptions provided to the patient are listed below.  Medications given during this visit Medications - No data to display   The patient appears reasonably screen and/or stabilized for discharge and I doubt any  other medical condition or other [facility] requiring further screening, evaluation, or treatment in the ED at this time prior to discharge.          Final Clinical Impression(s) / ED Diagnoses Final diagnoses:  None    Rx / DC Orders ED Discharge Orders     None         Deno Etienne, DO 11/29/21 [phone]

## 2021-11-29 NOTE — Discharge Instructions (Signed)
Whenever you fall you should call your family doctor, sometimes this is a warning sign that you are on too many medications or you need physical therapy.  Please discuss this with them.

## 2021-11-29 NOTE — ED Triage Notes (Signed)
[facility]. Pt. Has dementia at baseline. Hx of multiple falls. Pt. Denies pain with EMS. EMS notes bruise to left thigh.  EMS VS:  BP: 124/60 HR: 73 O2: 99%

## 2021-12-28 DIAGNOSIS — Q6689 Other  specified congenital deformities of feet: Secondary | ICD-10-CM | POA: Diagnosis not present

## 2021-12-28 DIAGNOSIS — L602 Onychogryphosis: Secondary | ICD-10-CM | POA: Diagnosis not present

## 2022-01-25 ENCOUNTER — Non-Acute Institutional Stay: Payer: Medicare Other | Admitting: Nurse Practitioner

## 2022-01-25 ENCOUNTER — Encounter: Payer: Self-pay | Admitting: Nurse Practitioner

## 2022-01-25 DIAGNOSIS — I10 Essential (primary) hypertension: Secondary | ICD-10-CM | POA: Diagnosis not present

## 2022-01-25 DIAGNOSIS — N1832 Chronic kidney disease, stage 3b: Secondary | ICD-10-CM | POA: Diagnosis not present

## 2022-01-25 DIAGNOSIS — R296 Repeated falls: Secondary | ICD-10-CM

## 2022-01-25 DIAGNOSIS — M159 Polyosteoarthritis, unspecified: Secondary | ICD-10-CM

## 2022-01-25 DIAGNOSIS — I351 Nonrheumatic aortic (valve) insufficiency: Secondary | ICD-10-CM | POA: Diagnosis not present

## 2022-01-25 DIAGNOSIS — I2510 Atherosclerotic heart disease of native coronary artery without angina pectoris: Secondary | ICD-10-CM | POA: Diagnosis not present

## 2022-01-25 DIAGNOSIS — D500 Iron deficiency anemia secondary to blood loss (chronic): Secondary | ICD-10-CM

## 2022-01-25 DIAGNOSIS — F339 Major depressive disorder, recurrent, unspecified: Secondary | ICD-10-CM

## 2022-01-25 DIAGNOSIS — G309 Alzheimer's disease, unspecified: Secondary | ICD-10-CM

## 2022-01-25 DIAGNOSIS — R269 Unspecified abnormalities of gait and mobility: Secondary | ICD-10-CM | POA: Diagnosis not present

## 2022-01-25 DIAGNOSIS — G609 Hereditary and idiopathic neuropathy, unspecified: Secondary | ICD-10-CM

## 2022-01-25 DIAGNOSIS — K219 Gastro-esophageal reflux disease without esophagitis: Secondary | ICD-10-CM

## 2022-01-25 DIAGNOSIS — F0280 Dementia in other diseases classified elsewhere without behavioral disturbance: Secondary | ICD-10-CM

## 2022-01-25 DIAGNOSIS — K5901 Slow transit constipation: Secondary | ICD-10-CM

## 2022-01-25 NOTE — Progress Notes (Signed)
Location:  [facility] Room Number: AL/907/A Place of Service:  ALF (13) Provider:  Carmel Waddington X, NP  Virgie Dad, MD  Patient Care Team: Virgie Dad, MD as PCP - General (Internal Medicine) Elouise Munroe, MD as PCP - Cardiology (Cardiology)  Extended Emergency Contact Information Primary Emergency Contact: Weeks,[name] Address: [address]          [place], [place] [address] [name] Phone: [phone] Mobile Phone: [phone] Relation: Daughter Secondary Emergency Contact: [name], [place] Mobile Phone: [phone] Relation: Daughter  Code Status:  FULL Goals of care: Advanced Directive information    01/25/2022   10:57 AM  Advanced Directives  Does Patient Have a Medical Advance Directive? Yes  Type of Advance Directive Living will  Does patient want to make changes to medical advance directive? No - Patient declined     Chief Complaint  Patient presents with  . Medical Management of Chronic Issues    Patient is here for a follow up for chronic conditions, patient is due for 2nd shingrix, and flu vaccine    HPI:  Pt is a 88 y.o. male seen today for an acute visit for    Past Medical History:  Diagnosis Date  . Anemia   . Arthritis    arthritis,osteopenia,"spinal stenosis"  . Bladder neck obstruction 03/28/2011  . CAD (coronary artery disease)   . Cancer [facility]) 12-06-12   Prostate cancer'98  . CKD (chronic kidney disease) stage 3, GFR 30-59 ml/min (HCC) 08/17/2017  . Depression   . Essential hypertension, benign 04/10/2009  . GERD (gastroesophageal reflux disease)   . GERD without esophagitis 11/05/2016  . H/O hiatal hernia   . H/O vertigo 12-06-12   none recent  . Hyperlipidemia   . IBS (irritable bowel syndrome)   . Mixed hyperlipidemia 04/10/2009  . Neuropathy   . Osteopenia   . Peripheral neuropathy 12-06-12   peripheral neuropathy  . Rhinitis    . RLS (restless legs syndrome)   . Vascular dementia ([place]) 09/30/2019  . Vitamin B12 deficiency    Past Surgical History:  Procedure Laterality Date  . APPENDECTOMY    . CARPAL TUNNEL RELEASE     both hands  . cataract surgery Left 12-06-12   recent surgery  . CHOLECYSTECTOMY    . COLONOSCOPY WITH PROPOFOL N/A 12/25/2012   Procedure: COLONOSCOPY WITH PROPOFOL;  Surgeon: Garlan Fair, MD;  [facility];  Service: Endoscopy;  Laterality: N/A;  . CORONARY STENT INTERVENTION N/A 03/13/2020   Procedure: CORONARY STENT INTERVENTION;  Surgeon: Nelva Bush, MD;  Location: [facility];  Service: Cardiovascular;  Laterality: N/A;  . ESOPHAGOGASTRODUODENOSCOPY (EGD) WITH PROPOFOL N/A 12/25/2012   Procedure: ESOPHAGOGASTRODUODENOSCOPY (EGD) WITH PROPOFOL;  Surgeon: Garlan Fair, MD;  [facility];  Service: Endoscopy;  Laterality: N/A;  . INTRAVASCULAR ULTRASOUND/IVUS N/A 03/13/2020   Procedure: Intravascular Ultrasound/IVUS;  Surgeon: Nelva Bush, MD;  Location: [facility] CV LAB;  Service: Cardiovascular;  Laterality: N/A;  . LEFT HEART CATH AND CORONARY ANGIOGRAPHY N/A 03/13/2020   Procedure: LEFT HEART CATH AND CORONARY ANGIOGRAPHY;  Surgeon: Nelva Bush, MD;  Location: [facility] LAB;  Service: Cardiovascular;  Laterality: N/A;  . PROSTATE SURGERY  12-06-12  . TONSILLECTOMY    . TOTAL KNEE ARTHROPLASTY Right 11/25/2014   Procedure: RIGHT TOTAL KNEE ARTHROPLASTY;  Surgeon: Melrose Nakayama, MD;  Location: [facility]  OR;  Service: Orthopedics;  Laterality: Right;    Allergies  Allergen Reactions  . Niacin Itching  . Penicillins Other (See Comments)    "rash"   . Tramadol Itching    Outpatient Encounter Medications as of 01/25/2022  Medication Sig  . acetaminophen (TYLENOL) 500 MG tablet Take 1,000 mg by mouth in the morning and at bedtime.  Marland Kitchen atorvastatin (LIPITOR) 80 MG tablet Take 1 tablet (80 mg total) by mouth daily.  . Cholecalciferol (VITAMIN  D) 50 MCG (2000 UT) tablet Take 1 pill daily.  Marland Kitchen gabapentin (NEURONTIN) 100 MG capsule Take 200 mg by mouth 2 (two) times daily.  . Iron, Ferrous Sulfate, 325 (65 Fe) MG TABS Take 1 tablet by mouth. **PLEASE GIVE THIS MED WITH FOOD** Mon, Wed, and Fri and Saturday  . isosorbide mononitrate (IMDUR) 30 MG 24 hr tablet Take 30 mg by mouth daily. Take 1/2 tab =15 mg, oral, Once A Day, **HOLD IF SBP IS LESS THAN 100**  . lactose free nutrition (BOOST) LIQD Take 237 mLs by mouth daily.  Marland Kitchen lidocaine 4 % Place 1 patch onto the skin daily. APPLY TO LEFT HIP  . nitroGLYCERIN (NITROSTAT) 0.4 MG SL tablet Place 0.4 mg under the tongue every 5 (five) minutes as needed for chest pain. X 3  . pantoprazole (PROTONIX) 40 MG tablet Take 40 mg by mouth 2 (two) times daily.  . polyethylene glycol powder (GLYCOLAX/MIRALAX) 17 GM/SCOOP powder Take 17 g by mouth daily as needed for moderate constipation.  . senna (SENOKOT) 8.6 MG tablet Take 1 tablet (8.6 mg total) by mouth daily.  . sertraline (ZOLOFT) 100 MG tablet 1 tablet Daily for Mood  . sertraline (ZOLOFT) 25 MG tablet Take 25 mg by mouth daily. Take with 100 mg tab to total 125 mg daily.  . vitamin B-12 (CYANOCOBALAMIN) 1000 MCG tablet Take 1,000 mcg by mouth daily.   No facility-administered encounter medications on file as of 01/25/2022.    Review of Systems  Immunization History  Administered Date(s) Administered  . DT (Pediatric) 07/23/2002  . Influenza Split 02/11/2013, 04/13/2016, 03/13/2017  . Influenza Whole 03/22/2002  . Influenza, High Dose Seasonal PF 03/13/2014, 03/05/2015, 03/05/2018  . Influenza-Unspecified 03/22/2002, 04/07/2004, 03/13/2005, 03/31/2006, 05/02/2007, 02/12/2008, 03/13/2009, 03/14/2011, 03/13/2012, 04/22/2016, 03/13/2017, 06/13/2018, 03/28/2019, 04/01/2021  . Moderna Sars-Covid-2 Vaccination 06/17/2019, 07/15/2019, 04/21/2020  . Pneumococcal Conjugate-13 05/23/2017  . Pneumococcal Polysaccharide-23 01/20/2016  .  Pneumococcal-Unspecified 03/22/2002  . Tdap 06/21/2021  . Unspecified SARS-COV-2 Vaccination 11/10/2020, 03/02/2021  . Zoster Recombinat (Shingrix) 06/16/2021   Pertinent  Health Maintenance Due  Topic Date Due  . INFLUENZA VACCINE  01/11/2022      03/16/2020    8:30 PM 05/01/2020   10:41 PM 05/02/2020    4:48 AM 02/13/2021    4:54 AM 11/29/2021    7:02 AM  Fall Risk  Patient Fall Risk Level Moderate fall risk High fall risk Moderate fall risk Low fall risk High fall risk   Functional Status Survey:    Vitals:   01/25/22 1056  BP: 114/62  Pulse: 81  Resp: 16  Temp: (!) 97.2 F (36.2 C)  SpO2: 96%  Weight: 137 lb (62.1 kg)  Height: 6' (1.829 m)   Body mass index is 18.58 kg/m. Physical Exam  Labs reviewed: Recent Labs    02/13/21 0509 04/13/21 0000 05/19/2021 2140 07/23/21 0000 07/29/21 0000 08/06/21 0000  NA 140   < > 140 141 146 141  K 4.6   < > 4.4 3.9 4.2  5.0  CL 107   < > 106 105 109* 105  CO2 28   < > 25 27* 26* 27*  GLUCOSE 100*  --  109*  --   --   --   BUN 30*   < > 30* 31* 33* 32*  CREATININE 1.59*   < > 1.52* 1.6* 1.7* 1.7*  CALCIUM 9.0   < > 8.6* 8.9 8.4* 8.7   < > = values in this interval not displayed.   Recent Labs    06/05/2021 2140 07/23/21 0000 08/06/21 0000  AST '24 31 18  '$ ALT '24 30 14  '$ ALKPHOS 76 84 150*  BILITOT 0.6  --   --   PROT 5.7*  --   --   ALBUMIN 3.4* 4.3 3.9   Recent Labs    02/13/21 0509 04/13/21 0000 05/25/2021 2140 07/23/21 0000 07/29/21 0000 08/04/21 0000 08/06/21 0000  WBC 7.8   < > 8.4 9.5 6.1 5.8 8.3  NEUTROABS 6.2  --  6.7 8,066.00  --   --   --   HGB 9.0*   < > 9.6* 10.6* 8.1* 8.8* 9.4*  HCT 28.6*   < > 30.6* 32* 25* 27* 29*  MCV 97.3  --  97.1  --   --   --   --   PLT 133*   < > 122* 108* 103* 115* 146*   < > = values in this interval not displayed.   Lab Results  Component Value Date   TSH 3.97 07/08/2021   Lab Results  Component Value Date   HGBA1C 5.1 09/10/2019   Lab Results  Component  Value Date   CHOL 114 04/13/2021   HDL 37 04/13/2021   LDLCALC 59 04/13/2021   TRIG 101 04/13/2021   CHOLHDL 4.2 02/26/2020    Significant Diagnostic Results in last 30 days:  No results found.  Assessment/Plan No problem-specific Assessment & Plan notes found for this encounter.     Family/ staff Communication: plan of care reviewed with the patient and charge nurse.  Labs/tests ordered: none  Time spend 40 minutes.

## 2022-02-01 ENCOUNTER — Encounter: Payer: Self-pay | Admitting: Nurse Practitioner

## 2022-02-01 DIAGNOSIS — M159 Polyosteoarthritis, unspecified: Secondary | ICD-10-CM | POA: Insufficient documentation

## 2022-02-01 NOTE — Assessment & Plan Note (Signed)
Uses walker for ambulation,  risk of falling is high.

## 2022-02-01 NOTE — Assessment & Plan Note (Signed)
multiple sites, takes Tylenol

## 2022-02-01 NOTE — Assessment & Plan Note (Signed)
NSTEMI stent f/u cardiology. Hospital stay 03/11/20-03/16/20 for NSTEMI, troponin 300s at ED, Cath showed LAD stenosis, underwent drug eluting stent, EF 55-60%, takes Atorvastatin, ASA,, Added Isosorbide for chest pain, f/u Cardiology,  took him off Plavix.

## 2022-02-01 NOTE — Assessment & Plan Note (Signed)
Stable,  takes Senokot, prn MiraLax

## 2022-02-01 NOTE — Assessment & Plan Note (Signed)
EF 55-60%

## 2022-02-01 NOTE — Assessment & Plan Note (Signed)
Blood pressure is controlled, off Metoprolol.

## 2022-02-01 NOTE — Assessment & Plan Note (Signed)
Stable, takes Pantoprazole 

## 2022-02-01 NOTE — Assessment & Plan Note (Signed)
takes Gabapentin, ambulates with walker.

## 2022-02-01 NOTE — Assessment & Plan Note (Signed)
had Colon and EGD, takes Iron, Vit B12, Hgb 9.4 08/06/21, Hx of FOBT positive x2 ? 2/2 taking Iron, Iron 48.

## 2022-02-01 NOTE — Assessment & Plan Note (Signed)
His mood is stable, takes Sertraline,

## 2022-02-01 NOTE — Assessment & Plan Note (Signed)
last ED visit for fall 12/09/21

## 2022-02-01 NOTE — Assessment & Plan Note (Signed)
didn't tolerate memory meds, underwent neurology eval in the past. TSH 3.97 07/08/21

## 2022-02-01 NOTE — Assessment & Plan Note (Signed)
Bun/creat 33/1.66 07/29/21

## 2022-04-12 ENCOUNTER — Encounter: Payer: Self-pay | Admitting: Family Medicine

## 2022-04-12 ENCOUNTER — Non-Acute Institutional Stay: Payer: Medicare Other | Admitting: Family Medicine

## 2022-04-12 DIAGNOSIS — I351 Nonrheumatic aortic (valve) insufficiency: Secondary | ICD-10-CM | POA: Diagnosis not present

## 2022-04-12 DIAGNOSIS — I2510 Atherosclerotic heart disease of native coronary artery without angina pectoris: Secondary | ICD-10-CM | POA: Diagnosis not present

## 2022-04-12 DIAGNOSIS — F0280 Dementia in other diseases classified elsewhere without behavioral disturbance: Secondary | ICD-10-CM | POA: Diagnosis not present

## 2022-04-12 DIAGNOSIS — E782 Mixed hyperlipidemia: Secondary | ICD-10-CM | POA: Diagnosis not present

## 2022-04-12 DIAGNOSIS — I10 Essential (primary) hypertension: Secondary | ICD-10-CM

## 2022-04-12 DIAGNOSIS — G309 Alzheimer's disease, unspecified: Secondary | ICD-10-CM

## 2022-04-12 DIAGNOSIS — N1832 Chronic kidney disease, stage 3b: Secondary | ICD-10-CM

## 2022-04-12 NOTE — Progress Notes (Signed)
Provider:  Alain Honey, MD Location:      Place of Service:     PCP: Virgie Dad, MD Patient Care Team: Virgie Dad, MD as PCP - General (Internal Medicine) Elouise Munroe, MD as PCP - Cardiology (Cardiology)  Extended Emergency Contact Information Primary Emergency Contact: [facility] Address: [address]          [place], [place] [address] [name] of [place] Phone: [phone] Mobile Phone: [phone] Relation: Daughter Secondary Emergency Contact: [name], [place] of [place] Mobile Phone: [phone] Relation: Daughter  Code Status:  Goals of Care: Advanced Directive information    01/25/2022   10:57 AM  Advanced Directives  Does Patient Have a Medical Advance Directive? Yes  Type of Advance Directive Living will  Does patient want to make changes to medical advance directive? No - Patient declined      No chief complaint on file.   HPI: Patient is a 88 y.o. male seen today for medical management of chronic problems including chronic kidney disease, stage IIIa, depression, hypertension, GERD, hyperlipidemia, neuropathy, and vascular dementia. Patient moved here from [facility] per his history.  He does not recall why he moved here from [place] but he does have a daughter here.  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 He has no specific complaints today.  In reviewing his medical record there was a history of falls but he does not recall that happening. Appetite is good sleep good normal bowel and bladder function.  He has a walker in his room but says he does not use it.  There is signs around his room to go slow in be careful.  Past Medical History:  Diagnosis Date   Anemia    Arthritis    arthritis,osteopenia,"spinal stenosis"   Bladder neck obstruction 03/28/2011   CAD (coronary artery disease)    Cancer ([place]) 12-06-12   Prostate cancer'98   CKD  (chronic kidney disease) stage 3, GFR 30-59 ml/min ([place]) 08/17/2017   Depression    Essential hypertension, benign 04/10/2009   GERD (gastroesophageal reflux disease)    GERD without esophagitis 11/05/2016   H/O hiatal hernia    H/O vertigo 12-06-12   none recent   Hyperlipidemia    IBS (irritable bowel syndrome)    Mixed hyperlipidemia 04/10/2009   Neuropathy    Osteopenia    Peripheral neuropathy 12-06-12   peripheral neuropathy   Rhinitis    RLS (restless legs syndrome)    Vascular dementia ([place]) 09/30/2019   Vitamin B12 deficiency    Past Surgical History:  Procedure Laterality Date   APPENDECTOMY     CARPAL TUNNEL RELEASE     both hands   cataract surgery Left 12-06-12   recent surgery   CHOLECYSTECTOMY     COLONOSCOPY WITH PROPOFOL N/A 12/25/2012   Procedure: COLONOSCOPY WITH PROPOFOL;  Surgeon: Garlan Fair, MD;  Location: [facility];  Service: Endoscopy;  Laterality: N/A;   CORONARY STENT INTERVENTION N/A 03/13/2020   Procedure: CORONARY STENT INTERVENTION;  Surgeon: Nelva Bush, MD;  Location: [facility];  Service: Cardiovascular;  Laterality: N/A;   ESOPHAGOGASTRODUODENOSCOPY (EGD) WITH PROPOFOL N/A 12/25/2012   Procedure: ESOPHAGOGASTRODUODENOSCOPY (EGD) WITH PROPOFOL;  Surgeon: Garlan Fair, MD;  Location: [facility];  Service: Endoscopy;  Laterality: N/A;   INTRAVASCULAR ULTRASOUND/IVUS N/A 03/13/2020   Procedure: Intravascular Ultrasound/IVUS;  Surgeon: Nelva Bush,  MD;  Location: [facility];  Service: Cardiovascular;  Laterality: N/A;   LEFT HEART CATH AND CORONARY ANGIOGRAPHY N/A 03/13/2020   Procedure: LEFT HEART CATH AND CORONARY ANGIOGRAPHY;  Surgeon: Nelva Bush, MD;  Location: [facility];  Service: Cardiovascular;  Laterality: N/A;   PROSTATE SURGERY  12-06-12   TONSILLECTOMY     TOTAL KNEE ARTHROPLASTY Right 11/25/2014   Procedure: RIGHT TOTAL KNEE ARTHROPLASTY;  Surgeon: Melrose Nakayama, MD;  Location: [facility];   Service: Orthopedics;  Laterality: Right;    reports that he quit smoking about 58 years ago. His smoking use included cigarettes. He has a 15.00 pack-year smoking history. He has never used smokeless tobacco. He reports current alcohol use. He reports that he does not use drugs. Social History   Socioeconomic History   Marital status: Widowed    Spouse name: Not on file   Number of children: 2   Years of education: College   Highest education level: Not on file  Occupational History   Occupation: Retired  Tobacco Use   Smoking status: Former    Packs/day: 0.50    Years: 30.00    Total pack years: 15.00    Types: Cigarettes    Quit date: 06/14/1963    Years since quitting: 58.8   Smokeless tobacco: Never  Vaping Use   Vaping Use: Never used  Substance and Sexual Activity   Alcohol use: Yes    Alcohol/week: 0.0 standard drinks of alcohol    Comment: Socially   Drug use: No   Sexual activity: Not Currently  Other Topics Concern   Not on file  Social History Narrative   Pt lives at home alone, widowed in 2011, married for 55 years. They have two grown daugthers.   Plays the piano.   Caffeine Use: Rarely   Social Determinants of Health   Financial Resource Strain: Not on file  Food Insecurity: Not on file  Transportation Needs: Not on file  Physical Activity: Not on file  Stress: Not on file  Social Connections: Not on file  Intimate Partner Violence: Not on file    Functional Status Survey:    Family History  Problem Relation Age of Onset   CAD Father        Deceased, 70   Dementia Mother        Deceased, 35   Diabetes Brother    Hyperlipidemia Brother    Hypertension Brother    Dementia Brother    Colon cancer Neg Hx    Colon polyps Neg Hx    Kidney disease Neg Hx    Esophageal cancer Neg Hx    Gallbladder disease Neg Hx     Health Maintenance  Topic Date Due   COVID-19 Vaccine (6 - Moderna risk series) 04/27/2021   Zoster Vaccines- Shingrix (2 of 2)  08/11/2021   INFLUENZA VACCINE  01/11/2022   Medicare Annual Wellness (AWV)  05/15/2022   TETANUS/TDAP  06/22/2031   Pneumonia Vaccine 61+ Years old  Completed   HPV VACCINES  Aged Out    Allergies  Allergen Reactions   Niacin Itching   Penicillins Other (See Comments)    "rash"    Tramadol Itching    Outpatient Encounter Medications as of 04/12/2022  Medication Sig   acetaminophen (TYLENOL) 500 MG tablet Take 1,000 mg by mouth in the morning and at bedtime.   atorvastatin (LIPITOR) 80 MG tablet Take 1 tablet (80 mg total) by mouth daily.   Cholecalciferol (  VITAMIN D) 50 MCG (2000 UT) tablet Take 1 pill daily.   gabapentin (NEURONTIN) 100 MG capsule Take 200 mg by mouth 2 (two) times daily.   Iron, Ferrous Sulfate, 325 (65 Fe) MG TABS Take 1 tablet by mouth. **PLEASE GIVE THIS MED WITH FOOD** Mon, Wed, and Fri and Saturday   isosorbide mononitrate (IMDUR) 30 MG 24 hr tablet Take 30 mg by mouth daily. Take 1/2 tab =15 mg, oral, Once A Day, **HOLD IF SBP IS LESS THAN 100**   lactose free nutrition (BOOST) LIQD Take 237 mLs by mouth daily.   lidocaine 4 % Place 1 patch onto the skin daily. APPLY TO LEFT HIP   nitroGLYCERIN (NITROSTAT) 0.4 MG SL tablet Place 0.4 mg under the tongue every 5 (five) minutes as needed for chest pain. X 3   pantoprazole (PROTONIX) 40 MG tablet Take 40 mg by mouth 2 (two) times daily.   polyethylene glycol powder (GLYCOLAX/MIRALAX) 17 GM/SCOOP powder Take 17 g by mouth daily as needed for moderate constipation.   senna (SENOKOT) 8.6 MG tablet Take 1 tablet (8.6 mg total) by mouth daily.   sertraline (ZOLOFT) 100 MG tablet 1 tablet Daily for Mood   sertraline (ZOLOFT) 25 MG tablet Take 25 mg by mouth daily. Take with 100 mg tab to total 125 mg daily.   vitamin B-12 (CYANOCOBALAMIN) 1000 MCG tablet Take 1,000 mcg by mouth daily.   No facility-administered encounter medications on file as of 04/12/2022.    Review of Systems  Constitutional: Negative.    HENT: Negative.    Respiratory: Negative.    Cardiovascular: Negative.   Gastrointestinal: Negative.   Genitourinary: Negative.   Musculoskeletal:  Positive for gait problem.  Psychiatric/Behavioral:  Positive for confusion.     Vitals:   04/12/22 1612  BP: 114/62  Pulse: 81  Resp: 16  Temp: (!) 97.2 F (36.2 C)   There is no height or weight on file to calculate BMI. Physical Exam Vitals and nursing note reviewed.  Constitutional:      Appearance: Normal appearance.  HENT:     Mouth/Throat:     Mouth: Mucous membranes are moist.     Pharynx: Oropharynx is clear.  Eyes:     Extraocular Movements: Extraocular movements intact.     Conjunctiva/sclera: Conjunctivae normal.     Pupils: Pupils are equal, round, and reactive to light.  Cardiovascular:     Rate and Rhythm: Normal rate and regular rhythm.     Heart sounds: Normal heart sounds.  Pulmonary:     Effort: Pulmonary effort is normal.     Breath sounds: Normal breath sounds.  Abdominal:     General: Abdomen is flat. Bowel sounds are normal.  Musculoskeletal:        General: No swelling, tenderness or deformity.     Comments: Supposed to use walker for ambulation  Neurological:     General: No focal deficit present.     Mental Status: He is alert.     Comments: Oriented to person but not place or time  Psychiatric:        Mood and Affect: Mood normal.        Behavior: Behavior normal.     Labs reviewed: Basic Metabolic Panel: Recent Labs    06/04/2021 2140 07/23/21 0000 07/29/21 0000 08/06/21 0000  NA 140 141 146 141  K 4.4 3.9 4.2 5.0  CL 106 105 109* 105  CO2 25 27* 26* 27*  GLUCOSE 109*  --   --   --  BUN 30* 31* 33* 32*  CREATININE 1.52* 1.6* 1.7* 1.7*  CALCIUM 8.6* 8.9 8.4* 8.7   Liver Function Tests: Recent Labs    06/04/2021 2140 07/23/21 0000 08/06/21 0000  AST '24 31 18  '$ ALT '24 30 14  '$ ALKPHOS 76 84 150*  BILITOT 0.6  --   --   PROT 5.7*  --   --   ALBUMIN 3.4* 4.3 3.9   No  results for input(s): "LIPASE", "AMYLASE" in the last 8760 hours. No results for input(s): "AMMONIA" in the last 8760 hours. CBC: Recent Labs    05/18/2021 2140 07/23/21 0000 07/29/21 0000 08/04/21 0000 08/06/21 0000  WBC 8.4 9.5 6.1 5.8 8.3  NEUTROABS 6.7 8,066.00  --   --   --   HGB 9.6* 10.6* 8.1* 8.8* 9.4*  HCT 30.6* 32* 25* 27* 29*  MCV 97.1  --   --   --   --   PLT 122* 108* 103* 115* 146*   Cardiac Enzymes: No results for input(s): "CKTOTAL", "CKMB", "CKMBINDEX", "TROPONINI" in the last 8760 hours. BNP: Invalid input(s): "POCBNP" Lab Results  Component Value Date   HGBA1C 5.1 09/10/2019   Lab Results  Component Value Date   TSH 3.97 07/08/2021   Lab Results  Component Value Date   VITAMINB12 256 08/04/2021   No results found for: "FOLATE" Lab Results  Component Value Date   IRON 48 08/04/2021   TIBC 238 08/04/2021   FERRITIN 132 08/04/2021    Imaging and Procedures obtained prior to SNF admission: DG Pelvis Portable  Result Date: 11/29/2021 CLINICAL DATA:  Left hip pain. Patient with dementia, multiple falls brought in by the EMS with hip pain EXAM: PORTABLE PELVIS 1-2 VIEWS COMPARISON:  None Available. FINDINGS: There is no evidence of pelvic fracture or diastasis. No pelvic bone lesions are seen. Mild degenerative changes at the bilateral hip joints. Moderate spondylosis of the visualized lumbosacral spine. Multiple surgical clips in the pelvis. IMPRESSION: No fracture or dislocation. Mild degenerative changes at the hip joints. Electronically Signed   By: Frazier Richards M.D.   On: 11/29/2021 07:53    Assessment/Plan 1. Aortic valve insufficiency, etiology of cardiac valve disease unspecified I cannot hear a murmur today  2. Stage 3b chronic kidney disease (HCC) Creatinine has been stable over the past year  3. Essential hypertension, benign Blood pressures have been normal recently off antihypertensives  4. Mixed hyperlipidemia Continues to take  atorvastatin 80 m  5. Alzheimer's dementia without behavioral disturbance ([place]) Does well in assisted living environment.  Continues to take sertraline    Family/ staff Communication:   Labs/tests ordered:  .smmsig

## 2022-04-14 DIAGNOSIS — Z23 Encounter for immunization: Secondary | ICD-10-CM | POA: Diagnosis not present

## 2022-06-05 ENCOUNTER — Emergency Department (HOSPITAL_COMMUNITY): Payer: Medicare Other

## 2022-06-05 ENCOUNTER — Encounter (HOSPITAL_COMMUNITY): Payer: Self-pay | Admitting: Emergency Medicine

## 2022-06-05 ENCOUNTER — Inpatient Hospital Stay (HOSPITAL_COMMUNITY)
Admission: EM | Admit: 2022-06-05 | Discharge: 2022-06-09 | DRG: 064 | Disposition: A | Discharge status: Hospice | Payer: Medicare Other | Source: Skilled Nursing Facility | Attending: Neurology | Admitting: Neurology

## 2022-06-05 ENCOUNTER — Other Ambulatory Visit: Payer: Self-pay

## 2022-06-05 DIAGNOSIS — D649 Anemia, unspecified: Secondary | ICD-10-CM | POA: Diagnosis not present

## 2022-06-05 DIAGNOSIS — D72829 Elevated white blood cell count, unspecified: Secondary | ICD-10-CM | POA: Diagnosis present

## 2022-06-05 DIAGNOSIS — Z8546 Personal history of malignant neoplasm of prostate: Secondary | ICD-10-CM | POA: Diagnosis not present

## 2022-06-05 DIAGNOSIS — I619 Nontraumatic intracerebral hemorrhage, unspecified: Secondary | ICD-10-CM | POA: Diagnosis not present

## 2022-06-05 DIAGNOSIS — Z515 Encounter for palliative care: Secondary | ICD-10-CM

## 2022-06-05 DIAGNOSIS — R1310 Dysphagia, unspecified: Secondary | ICD-10-CM | POA: Diagnosis present

## 2022-06-05 DIAGNOSIS — E538 Deficiency of other specified B group vitamins: Secondary | ICD-10-CM | POA: Diagnosis present

## 2022-06-05 DIAGNOSIS — N1831 Chronic kidney disease, stage 3a: Secondary | ICD-10-CM | POA: Diagnosis present

## 2022-06-05 DIAGNOSIS — Z888 Allergy status to other drugs, medicaments and biological substances status: Secondary | ICD-10-CM

## 2022-06-05 DIAGNOSIS — R000 Tachycardia, unspecified: Secondary | ICD-10-CM | POA: Diagnosis present

## 2022-06-05 DIAGNOSIS — Z87891 Personal history of nicotine dependence: Secondary | ICD-10-CM | POA: Diagnosis not present

## 2022-06-05 DIAGNOSIS — R54 Age-related physical debility: Secondary | ICD-10-CM | POA: Diagnosis present

## 2022-06-05 DIAGNOSIS — I615 Nontraumatic intracerebral hemorrhage, intraventricular: Secondary | ICD-10-CM | POA: Diagnosis not present

## 2022-06-05 DIAGNOSIS — F0150 Vascular dementia without behavioral disturbance: Secondary | ICD-10-CM | POA: Diagnosis present

## 2022-06-05 DIAGNOSIS — I612 Nontraumatic intracerebral hemorrhage in hemisphere, unspecified: Secondary | ICD-10-CM | POA: Diagnosis not present

## 2022-06-05 DIAGNOSIS — G2581 Restless legs syndrome: Secondary | ICD-10-CM | POA: Diagnosis present

## 2022-06-05 DIAGNOSIS — S0191XA Laceration without foreign body of unspecified part of head, initial encounter: Secondary | ICD-10-CM | POA: Diagnosis present

## 2022-06-05 DIAGNOSIS — N289 Disorder of kidney and ureter, unspecified: Secondary | ICD-10-CM | POA: Diagnosis not present

## 2022-06-05 DIAGNOSIS — S0101XA Laceration without foreign body of scalp, initial encounter: Secondary | ICD-10-CM | POA: Diagnosis not present

## 2022-06-05 DIAGNOSIS — Z79899 Other long term (current) drug therapy: Secondary | ICD-10-CM

## 2022-06-05 DIAGNOSIS — Z7401 Bed confinement status: Secondary | ICD-10-CM | POA: Diagnosis not present

## 2022-06-05 DIAGNOSIS — I129 Hypertensive chronic kidney disease with stage 1 through stage 4 chronic kidney disease, or unspecified chronic kidney disease: Secondary | ICD-10-CM | POA: Diagnosis present

## 2022-06-05 DIAGNOSIS — G9389 Other specified disorders of brain: Secondary | ICD-10-CM | POA: Diagnosis not present

## 2022-06-05 DIAGNOSIS — R471 Dysarthria and anarthria: Secondary | ICD-10-CM | POA: Diagnosis present

## 2022-06-05 DIAGNOSIS — Z66 Do not resuscitate: Secondary | ICD-10-CM | POA: Diagnosis present

## 2022-06-05 DIAGNOSIS — G8194 Hemiplegia, unspecified affecting left nondominant side: Secondary | ICD-10-CM | POA: Diagnosis not present

## 2022-06-05 DIAGNOSIS — G936 Cerebral edema: Secondary | ICD-10-CM | POA: Diagnosis present

## 2022-06-05 DIAGNOSIS — R531 Weakness: Secondary | ICD-10-CM | POA: Diagnosis not present

## 2022-06-05 DIAGNOSIS — Z7982 Long term (current) use of aspirin: Secondary | ICD-10-CM

## 2022-06-05 DIAGNOSIS — R29810 Facial weakness: Secondary | ICD-10-CM | POA: Diagnosis present

## 2022-06-05 DIAGNOSIS — I6782 Cerebral ischemia: Secondary | ICD-10-CM | POA: Diagnosis not present

## 2022-06-05 DIAGNOSIS — D696 Thrombocytopenia, unspecified: Secondary | ICD-10-CM | POA: Diagnosis present

## 2022-06-05 DIAGNOSIS — Z8249 Family history of ischemic heart disease and other diseases of the circulatory system: Secondary | ICD-10-CM

## 2022-06-05 DIAGNOSIS — Z7189 Other specified counseling: Secondary | ICD-10-CM | POA: Diagnosis not present

## 2022-06-05 DIAGNOSIS — I6389 Other cerebral infarction: Secondary | ICD-10-CM | POA: Diagnosis not present

## 2022-06-05 DIAGNOSIS — M8580 Other specified disorders of bone density and structure, unspecified site: Secondary | ICD-10-CM | POA: Diagnosis present

## 2022-06-05 DIAGNOSIS — W19XXXA Unspecified fall, initial encounter: Secondary | ICD-10-CM | POA: Diagnosis present

## 2022-06-05 DIAGNOSIS — E782 Mixed hyperlipidemia: Secondary | ICD-10-CM | POA: Diagnosis present

## 2022-06-05 DIAGNOSIS — Z880 Allergy status to penicillin: Secondary | ICD-10-CM

## 2022-06-05 DIAGNOSIS — S0990XA Unspecified injury of head, initial encounter: Secondary | ICD-10-CM | POA: Diagnosis not present

## 2022-06-05 DIAGNOSIS — Z83438 Family history of other disorder of lipoprotein metabolism and other lipidemia: Secondary | ICD-10-CM

## 2022-06-05 DIAGNOSIS — R4182 Altered mental status, unspecified: Secondary | ICD-10-CM | POA: Diagnosis not present

## 2022-06-05 DIAGNOSIS — Z833 Family history of diabetes mellitus: Secondary | ICD-10-CM

## 2022-06-05 DIAGNOSIS — R5381 Other malaise: Secondary | ICD-10-CM | POA: Diagnosis present

## 2022-06-05 DIAGNOSIS — Z8673 Personal history of transient ischemic attack (TIA), and cerebral infarction without residual deficits: Secondary | ICD-10-CM | POA: Diagnosis not present

## 2022-06-05 DIAGNOSIS — F32A Depression, unspecified: Secondary | ICD-10-CM | POA: Diagnosis present

## 2022-06-05 DIAGNOSIS — S199XXA Unspecified injury of neck, initial encounter: Secondary | ICD-10-CM | POA: Diagnosis not present

## 2022-06-05 DIAGNOSIS — S0003XA Contusion of scalp, initial encounter: Secondary | ICD-10-CM | POA: Diagnosis not present

## 2022-06-05 DIAGNOSIS — I611 Nontraumatic intracerebral hemorrhage in hemisphere, cortical: Secondary | ICD-10-CM | POA: Diagnosis not present

## 2022-06-05 DIAGNOSIS — H53462 Homonymous bilateral field defects, left side: Secondary | ICD-10-CM | POA: Diagnosis not present

## 2022-06-05 DIAGNOSIS — I639 Cerebral infarction, unspecified: Secondary | ICD-10-CM | POA: Diagnosis not present

## 2022-06-05 DIAGNOSIS — I2510 Atherosclerotic heart disease of native coronary artery without angina pectoris: Secondary | ICD-10-CM | POA: Diagnosis present

## 2022-06-05 DIAGNOSIS — S0636AA Traumatic hemorrhage of cerebrum, unspecified, with loss of consciousness status unknown, initial encounter: Secondary | ICD-10-CM | POA: Diagnosis present

## 2022-06-05 DIAGNOSIS — G319 Degenerative disease of nervous system, unspecified: Secondary | ICD-10-CM | POA: Diagnosis not present

## 2022-06-05 DIAGNOSIS — R58 Hemorrhage, not elsewhere classified: Secondary | ICD-10-CM | POA: Diagnosis not present

## 2022-06-05 DIAGNOSIS — Z885 Allergy status to narcotic agent status: Secondary | ICD-10-CM

## 2022-06-05 DIAGNOSIS — R29717 NIHSS score 17: Secondary | ICD-10-CM | POA: Diagnosis present

## 2022-06-05 DIAGNOSIS — G459 Transient cerebral ischemic attack, unspecified: Secondary | ICD-10-CM | POA: Diagnosis not present

## 2022-06-05 MED ORDER — ACETAMINOPHEN 325 MG PO TABS
650.0000 mg | ORAL_TABLET | Freq: Once | ORAL | Status: DC
  Filled 2022-06-05: qty 2

## 2022-06-05 MED ORDER — ONDANSETRON HCL 4 MG/2ML IJ SOLN
4.0000 mg | Freq: Once | INTRAMUSCULAR | Status: AC
  Administered 2022-06-06: 4 mg via INTRAVENOUS
  Filled 2022-06-05: qty 2

## 2022-06-05 NOTE — ED Triage Notes (Signed)
Pt bib [facility] from [facility] for unwitnessed fall this evening. Staff heard fall happen and found patient on floor beside bed. Staff reports pt was unresponsive for approx 35-45 seconds after fall. 2inch lac to back of head with bleeding controlled.   BP 154/70, HR 62, RR 16, Spo2 97%, CBG 124

## 2022-06-05 NOTE — ED Notes (Signed)
Daughter Renie Ora [phone] would like an update asap

## 2022-06-05 NOTE — ED Provider Notes (Signed)
[facility] EMERGENCY DEPARTMENT Provider Note   CSN: [id number] Arrival date & time: 06/05/22  2211     History {Add pertinent medical, surgical, social history, OB history to HPI:1} Chief Complaint  Patient presents with   Carvel Huskins is a 88 y.o. male.  The history is provided by the EMS personnel and the nursing home. The history is limited by the condition of the patient (Dementia).  Fall  He has history of hypertension, hyperlipidemia, chronic kidney disease, coronary artery disease, dementia was brought in following an unwitnessed fall.  He is reported to have been unresponsive for 35-45 seconds.  He suffered a laceration to the back of his head.  Patient has no memory of the incident but states that he just feels bad.   Home Medications Prior to Admission medications   Medication Sig Start Date End Date Taking? Authorizing Provider  acetaminophen (TYLENOL) 500 MG tablet Take 1,000 mg by mouth in the morning and at bedtime.    [provider]  atorvastatin (LIPITOR) 80 MG tablet Take 1 tablet (80 mg total) by mouth daily. 03/17/20   Leanor Kail, PA  Cholecalciferol (VITAMIN D) 50 MCG (2000 UT) tablet Take 1 pill daily. 01/10/19   Unk Pinto, MD  gabapentin (NEURONTIN) 100 MG capsule Take 200 mg by mouth 2 (two) times daily.    [provider]  Iron, Ferrous Sulfate, 325 (65 Fe) MG TABS Take 1 tablet by mouth. **PLEASE GIVE THIS MED WITH FOOD** Mon, Wed, and Fri and Saturday    [provider]  isosorbide mononitrate (IMDUR) 30 MG 24 hr tablet Take 30 mg by mouth daily. Take 1/2 tab =15 mg, oral, Once A Day, **HOLD IF SBP IS LESS THAN 100**    [provider]  lactose free nutrition (BOOST) LIQD Take 237 mLs by mouth daily.    [provider]  lidocaine 4 % Place 1 patch onto the skin daily. APPLY TO LEFT HIP    [provider]  nitroGLYCERIN (NITROSTAT) 0.4 MG SL tablet Place 0.4 mg  under the tongue every 5 (five) minutes as needed for chest pain. X 3    [provider]  pantoprazole (PROTONIX) 40 MG tablet Take 40 mg by mouth 2 (two) times daily.    [provider]  polyethylene glycol powder (GLYCOLAX/MIRALAX) 17 GM/SCOOP powder Take 17 g by mouth daily as needed for moderate constipation. 08/04/21   Yvonna Alanis, NP  senna (SENOKOT) 8.6 MG tablet Take 1 tablet (8.6 mg total) by mouth daily. 08/04/21   Yvonna Alanis, NP  sertraline (ZOLOFT) 100 MG tablet 1 tablet Daily for Mood 09/10/19 03/30/30  Unk Pinto, MD  sertraline (ZOLOFT) 25 MG tablet Take 25 mg by mouth daily. Take with 100 mg tab to total 125 mg daily.    [provider]  vitamin B-12 (CYANOCOBALAMIN) 1000 MCG tablet Take 1,000 mcg by mouth daily.    [provider]      Allergies    Niacin, Penicillins, and Tramadol    Review of Systems   Review of Systems  Unable to perform ROS: Dementia    Physical Exam Updated Vital Signs BP (!) 148/68   Temp (!) 97.4 F (36.3 C) (Temporal)   Resp 14   Ht 6' (1.829 m)   Wt 62.1 kg   SpO2 100%   BMI 18.57 kg/m  Physical Exam Vitals and nursing note reviewed.   88 year old  male, resting comfortably and in no acute distress. Vital signs are significant for mildly elevated blood pressure. Oxygen saturation is 100%, which is normal. Head is normocephalic.  Laceration present on the occiput. PERRLA, EOMI. Oropharynx is clear. Neck is nontender. Back is nontender and there is no CVA tenderness. Lungs are clear without rales, wheezes, or rhonchi. Chest is nontender. Heart has regular rate and rhythm without murmur. Abdomen is soft, flat, nontender. Extremities have no cyanosis or edema, full range of motion is present. Skin is warm and dry without rash. Neurologic: Awake and alert, oriented to person but not place or time, cranial nerves are intact, moves all extremities equally.  ED Results / Procedures / Treatments    Labs (all labs ordered are listed, but only abnormal results are displayed) Labs Reviewed - No data to display  EKG None  Radiology No results found.  Procedures Procedures  {Document cardiac monitor, telemetry assessment procedure when appropriate:1}  Medications Ordered in ED Medications - No data to display  ED Course/ Medical Decision Making/ A&P                           Medical Decision Making Amount and/or Complexity of Data Reviewed Radiology: ordered.   Fall with head injury.  I have ordered CT of head and cervical spine.  Laceration will need to be closed with staples.  {Document critical care time when appropriate:1} {Document review of labs and clinical decision tools ie heart score, Chads2Vasc2 etc:1}  {Document your independent review of radiology images, and any outside records:1} {Document your discussion with family members, caretakers, and with consultants:1} {Document social determinants of health affecting pt's care:1} {Document your decision making why or why not admission, treatments were needed:1} Final Clinical Impression(s) / ED Diagnoses Final diagnoses:  None    Rx / DC Orders ED Discharge Orders     None

## 2022-06-06 ENCOUNTER — Inpatient Hospital Stay (HOSPITAL_COMMUNITY): Payer: Medicare Other

## 2022-06-06 DIAGNOSIS — E782 Mixed hyperlipidemia: Secondary | ICD-10-CM | POA: Diagnosis present

## 2022-06-06 DIAGNOSIS — I619 Nontraumatic intracerebral hemorrhage, unspecified: Secondary | ICD-10-CM | POA: Diagnosis present

## 2022-06-06 DIAGNOSIS — I611 Nontraumatic intracerebral hemorrhage in hemisphere, cortical: Secondary | ICD-10-CM

## 2022-06-06 DIAGNOSIS — N1831 Chronic kidney disease, stage 3a: Secondary | ICD-10-CM | POA: Diagnosis present

## 2022-06-06 DIAGNOSIS — S0101XA Laceration without foreign body of scalp, initial encounter: Secondary | ICD-10-CM | POA: Diagnosis present

## 2022-06-06 DIAGNOSIS — G459 Transient cerebral ischemic attack, unspecified: Secondary | ICD-10-CM | POA: Diagnosis not present

## 2022-06-06 DIAGNOSIS — Z8249 Family history of ischemic heart disease and other diseases of the circulatory system: Secondary | ICD-10-CM | POA: Diagnosis not present

## 2022-06-06 DIAGNOSIS — R531 Weakness: Secondary | ICD-10-CM | POA: Diagnosis not present

## 2022-06-06 DIAGNOSIS — G319 Degenerative disease of nervous system, unspecified: Secondary | ICD-10-CM | POA: Diagnosis not present

## 2022-06-06 DIAGNOSIS — R29717 NIHSS score 17: Secondary | ICD-10-CM | POA: Diagnosis present

## 2022-06-06 DIAGNOSIS — Z7189 Other specified counseling: Secondary | ICD-10-CM | POA: Diagnosis not present

## 2022-06-06 DIAGNOSIS — I639 Cerebral infarction, unspecified: Secondary | ICD-10-CM | POA: Diagnosis not present

## 2022-06-06 DIAGNOSIS — Z8673 Personal history of transient ischemic attack (TIA), and cerebral infarction without residual deficits: Secondary | ICD-10-CM | POA: Diagnosis not present

## 2022-06-06 DIAGNOSIS — D696 Thrombocytopenia, unspecified: Secondary | ICD-10-CM | POA: Diagnosis present

## 2022-06-06 DIAGNOSIS — I6782 Cerebral ischemia: Secondary | ICD-10-CM | POA: Diagnosis not present

## 2022-06-06 DIAGNOSIS — I612 Nontraumatic intracerebral hemorrhage in hemisphere, unspecified: Secondary | ICD-10-CM | POA: Diagnosis not present

## 2022-06-06 DIAGNOSIS — S0003XA Contusion of scalp, initial encounter: Secondary | ICD-10-CM | POA: Diagnosis not present

## 2022-06-06 DIAGNOSIS — G8194 Hemiplegia, unspecified affecting left nondominant side: Secondary | ICD-10-CM | POA: Diagnosis present

## 2022-06-06 DIAGNOSIS — R1310 Dysphagia, unspecified: Secondary | ICD-10-CM | POA: Diagnosis present

## 2022-06-06 DIAGNOSIS — F32A Depression, unspecified: Secondary | ICD-10-CM | POA: Diagnosis present

## 2022-06-06 DIAGNOSIS — I6389 Other cerebral infarction: Secondary | ICD-10-CM | POA: Diagnosis not present

## 2022-06-06 DIAGNOSIS — Z66 Do not resuscitate: Secondary | ICD-10-CM | POA: Diagnosis present

## 2022-06-06 DIAGNOSIS — S0636AA Traumatic hemorrhage of cerebrum, unspecified, with loss of consciousness status unknown, initial encounter: Secondary | ICD-10-CM | POA: Diagnosis present

## 2022-06-06 DIAGNOSIS — D649 Anemia, unspecified: Secondary | ICD-10-CM | POA: Diagnosis present

## 2022-06-06 DIAGNOSIS — I129 Hypertensive chronic kidney disease with stage 1 through stage 4 chronic kidney disease, or unspecified chronic kidney disease: Secondary | ICD-10-CM | POA: Diagnosis present

## 2022-06-06 DIAGNOSIS — G9389 Other specified disorders of brain: Secondary | ICD-10-CM | POA: Diagnosis not present

## 2022-06-06 DIAGNOSIS — E538 Deficiency of other specified B group vitamins: Secondary | ICD-10-CM | POA: Diagnosis present

## 2022-06-06 DIAGNOSIS — G2581 Restless legs syndrome: Secondary | ICD-10-CM | POA: Diagnosis present

## 2022-06-06 DIAGNOSIS — F0150 Vascular dementia without behavioral disturbance: Secondary | ICD-10-CM | POA: Diagnosis present

## 2022-06-06 DIAGNOSIS — Z87891 Personal history of nicotine dependence: Secondary | ICD-10-CM | POA: Diagnosis not present

## 2022-06-06 DIAGNOSIS — G936 Cerebral edema: Secondary | ICD-10-CM | POA: Diagnosis present

## 2022-06-06 DIAGNOSIS — Z8546 Personal history of malignant neoplasm of prostate: Secondary | ICD-10-CM | POA: Diagnosis not present

## 2022-06-06 DIAGNOSIS — I2510 Atherosclerotic heart disease of native coronary artery without angina pectoris: Secondary | ICD-10-CM | POA: Diagnosis present

## 2022-06-06 DIAGNOSIS — D72829 Elevated white blood cell count, unspecified: Secondary | ICD-10-CM | POA: Diagnosis present

## 2022-06-06 DIAGNOSIS — W19XXXA Unspecified fall, initial encounter: Secondary | ICD-10-CM | POA: Diagnosis present

## 2022-06-06 DIAGNOSIS — Z7401 Bed confinement status: Secondary | ICD-10-CM | POA: Diagnosis not present

## 2022-06-06 DIAGNOSIS — N289 Disorder of kidney and ureter, unspecified: Secondary | ICD-10-CM | POA: Diagnosis not present

## 2022-06-06 DIAGNOSIS — Z515 Encounter for palliative care: Secondary | ICD-10-CM | POA: Diagnosis not present

## 2022-06-06 DIAGNOSIS — M8580 Other specified disorders of bone density and structure, unspecified site: Secondary | ICD-10-CM | POA: Diagnosis present

## 2022-06-06 DIAGNOSIS — H53462 Homonymous bilateral field defects, left side: Secondary | ICD-10-CM | POA: Diagnosis present

## 2022-06-06 DIAGNOSIS — I615 Nontraumatic intracerebral hemorrhage, intraventricular: Secondary | ICD-10-CM | POA: Diagnosis present

## 2022-06-06 LAB — CBC WITH DIFFERENTIAL/PLATELET
Abs Immature Granulocytes: 0.07 10*3/uL (ref 0.00–0.07)
Basophils Absolute: 0 10*3/uL (ref 0.0–0.1)
Basophils Relative: 0 %
Eosinophils Absolute: 0.2 10*3/uL (ref 0.0–0.5)
Eosinophils Relative: 1 %
HCT: 33.8 % — ABNORMAL LOW (ref 39.0–52.0)
Hemoglobin: 11.2 g/dL — ABNORMAL LOW (ref 13.0–17.0)
Immature Granulocytes: 1 %
Lymphocytes Relative: 11 %
Lymphs Abs: 1.5 10*3/uL (ref 0.7–4.0)
MCH: 32.5 pg (ref 26.0–34.0)
MCHC: 33.1 g/dL (ref 30.0–36.0)
MCV: 98 fL (ref 80.0–100.0)
Monocytes Absolute: 0.7 10*3/uL (ref 0.1–1.0)
Monocytes Relative: 6 %
Neutro Abs: 11 10*3/uL — ABNORMAL HIGH (ref 1.7–7.7)
Neutrophils Relative %: 81 %
Platelets: 137 10*3/uL — ABNORMAL LOW (ref 150–400)
RBC: 3.45 MIL/uL — ABNORMAL LOW (ref 4.22–5.81)
RDW: 12.7 % (ref 11.5–15.5)
WBC: 13.6 10*3/uL — ABNORMAL HIGH (ref 4.0–10.5)
nRBC: 0 % (ref 0.0–0.2)

## 2022-06-06 LAB — MRSA NEXT GEN BY PCR, NASAL: MRSA by PCR Next Gen: NOT DETECTED

## 2022-06-06 LAB — BASIC METABOLIC PANEL
Anion gap: 13 (ref 5–15)
BUN: 30 mg/dL — ABNORMAL HIGH (ref 8–23)
CO2: 20 mmol/L — ABNORMAL LOW (ref 22–32)
Calcium: 9.2 mg/dL (ref 8.9–10.3)
Chloride: 109 mmol/L (ref 98–111)
Creatinine, Ser: 1.62 mg/dL — ABNORMAL HIGH (ref 0.61–1.24)
GFR, Estimated: 40 mL/min — ABNORMAL LOW (ref >60)
Glucose, Bld: 117 mg/dL — ABNORMAL HIGH (ref 70–99)
Potassium: 4.1 mmol/L (ref 3.5–5.1)
Sodium: 142 mmol/L (ref 135–145)

## 2022-06-06 LAB — PROTIME-INR
INR: 1.1 (ref 0.8–1.2)
Prothrombin Time: 13.6 seconds (ref 11.4–15.2)

## 2022-06-06 LAB — APTT: aPTT: 28 seconds (ref 24–36)

## 2022-06-06 LAB — TRIGLYCERIDES: Triglycerides: 74 mg/dL (ref <150)

## 2022-06-06 MED ORDER — CHLORHEXIDINE GLUCONATE CLOTH 2 % EX PADS
6.0000 | MEDICATED_PAD | Freq: Every day | CUTANEOUS | Status: DC
  Administered 2022-06-06 – 2022-06-07 (×2): 6 via TOPICAL

## 2022-06-06 MED ORDER — STROKE: EARLY STAGES OF RECOVERY BOOK
Freq: Once | Status: AC
  Filled 2022-06-06: qty 1

## 2022-06-06 MED ORDER — SODIUM CHLORIDE 0.9 % IV SOLN: INTRAVENOUS | Status: DC

## 2022-06-06 MED ORDER — SENNOSIDES-DOCUSATE SODIUM 8.6-50 MG PO TABS: 1.0000 | ORAL_TABLET | Freq: Two times a day (BID) | ORAL | Status: DC

## 2022-06-06 MED ORDER — PANTOPRAZOLE SODIUM 40 MG IV SOLR
40.0000 mg | Freq: Every day | INTRAVENOUS | Status: DC
  Administered 2022-06-06 (×2): 40 mg via INTRAVENOUS
  Filled 2022-06-06 (×2): qty 10

## 2022-06-06 MED ORDER — ACETAMINOPHEN 650 MG RE SUPP
650.0000 mg | RECTAL | Status: DC | PRN
  Administered 2022-06-06: 650 mg via RECTAL
  Filled 2022-06-06: qty 1

## 2022-06-06 MED ORDER — ACETAMINOPHEN 325 MG PO TABS: 650.0000 mg | ORAL_TABLET | ORAL | Status: DC | PRN

## 2022-06-06 MED ORDER — CLEVIDIPINE BUTYRATE 0.5 MG/ML IV EMUL
0.0000 mg/h | INTRAVENOUS | Status: DC | PRN
  Administered 2022-06-06 (×2): 2 mg/h via INTRAVENOUS
  Administered 2022-06-07: 6 mg/h via INTRAVENOUS
  Administered 2022-06-07: 8 mg/h via INTRAVENOUS
  Administered 2022-06-07: 10 mg/h via INTRAVENOUS
  Administered 2022-06-07: 15 mg/h via INTRAVENOUS
  Administered 2022-06-07: 16 mg/h via INTRAVENOUS
  Administered 2022-06-07: 2 mg/h via INTRAVENOUS
  Filled 2022-06-06 (×6): qty 50
  Filled 2022-06-06: qty 100
  Filled 2022-06-06: qty 50

## 2022-06-06 MED ORDER — ACETAMINOPHEN 160 MG/5ML PO SOLN: 650.0000 mg | ORAL | Status: DC | PRN

## 2022-06-06 NOTE — H&P (Signed)
Neurology H&P  CC: Left-sided weakness  History is obtained from: Patient, chart review  HPI: Ryan Humphrey is a 88 y.o. male with history of vascular dementia, hyperlipidemia who fell this evening.  He was getting out of bed and then just fell.  He is not sure why he fell.  He is not certain of where he is, or why he is in the hospital.  He lives in assisted living.  He was noted to be severely weak on the left side on arrival to the hospital and therefore taken to CT which revealed a sizable intraparenchymal hematoma in the right parietal region.   LKW: Unclear tpa given?: No, ICH IR Thrombectomy? No, ICH Modified Rankin Scale: 3-Moderate disability-requires help but walks WITHOUT assistance NIHSS: 17 ICH score: 1 Past Medical History:  Diagnosis Date   Anemia    Arthritis    arthritis,osteopenia,"spinal stenosis"   Bladder neck obstruction 03/28/2011   CAD (coronary artery disease)    Cancer ([place]) 12-06-12   Prostate cancer'98   CKD (chronic kidney disease) stage 3, GFR 30-59 ml/min ([place]) 08/17/2017   Depression    Essential hypertension, benign 04/10/2009   GERD (gastroesophageal reflux disease)    GERD without esophagitis 11/05/2016   H/O hiatal hernia    H/O vertigo 12-06-12   none recent   Hyperlipidemia    IBS (irritable bowel syndrome)    Mixed hyperlipidemia 04/10/2009   Neuropathy    Osteopenia    Peripheral neuropathy 12-06-12   peripheral neuropathy   Rhinitis    RLS (restless legs syndrome)    Vascular dementia ([place]) 09/30/2019   Vitamin B12 deficiency      Family History  Problem Relation Age of Onset   CAD Father        Deceased, 28   Dementia Mother        Deceased, 40   Diabetes Brother    Hyperlipidemia Brother    Hypertension Brother    Dementia Brother    Colon cancer Neg Hx    Colon polyps Neg Hx    Kidney disease Neg Hx    Esophageal cancer Neg Hx    Gallbladder disease Neg Hx      Social History:  reports that he quit smoking about  59 years ago. His smoking use included cigarettes. He has a 15.00 pack-year smoking history. He has never used smokeless tobacco. He reports current alcohol use. He reports that he does not use drugs.   Prior to Admission medications   Medication Sig Start Date End Date Taking? Authorizing Provider  acetaminophen (TYLENOL) 500 MG tablet Take 1,000 mg by mouth in the morning and at bedtime.    [provider]  atorvastatin (LIPITOR) 80 MG tablet Take 1 tablet (80 mg total) by mouth daily. 03/17/20   Leanor Kail, PA  Cholecalciferol (VITAMIN D) 50 MCG (2000 UT) tablet Take 1 pill daily. 01/10/19   Unk Pinto, MD  gabapentin (NEURONTIN) 100 MG capsule Take 200 mg by mouth 2 (two) times daily.    [provider]  Iron, Ferrous Sulfate, 325 (65 Fe) MG TABS Take 1 tablet by mouth. **PLEASE GIVE THIS MED WITH FOOD** Mon, Wed, and Fri and Saturday    [provider]  isosorbide mononitrate (IMDUR) 30 MG 24 hr tablet Take 30 mg by mouth daily. Take 1/2 tab =15 mg, oral, Once A Day, **HOLD IF SBP IS LESS THAN 100**    [provider]  lactose free nutrition (BOOST) LIQD Take  237 mLs by mouth daily.    [provider]  lidocaine 4 % Place 1 patch onto the skin daily. APPLY TO LEFT HIP    [provider]  nitroGLYCERIN (NITROSTAT) 0.4 MG SL tablet Place 0.4 mg under the tongue every 5 (five) minutes as needed for chest pain. X 3    [provider]  pantoprazole (PROTONIX) 40 MG tablet Take 40 mg by mouth 2 (two) times daily.    [provider]  polyethylene glycol powder (GLYCOLAX/MIRALAX) 17 GM/SCOOP powder Take 17 g by mouth daily as needed for moderate constipation. 08/04/21   Yvonna Alanis, NP  senna (SENOKOT) 8.6 MG tablet Take 1 tablet (8.6 mg total) by mouth daily. 08/04/21   Yvonna Alanis, NP  sertraline (ZOLOFT) 100 MG tablet 1 tablet Daily for Mood 09/10/19 03/30/30  Unk Pinto, MD  sertraline (ZOLOFT) 25 MG tablet  Take 25 mg by mouth daily. Take with 100 mg tab to total 125 mg daily.    [provider]  vitamin B-12 (CYANOCOBALAMIN) 1000 MCG tablet Take 1,000 mcg by mouth daily.    [provider]     Exam: Current vital signs: BP (!) 156/68   Pulse 83   Temp (!) 97.4 F (36.3 C) (Temporal)   Resp (!) 21   Ht 6' (1.829 m)   Wt 62.1 kg   SpO2 97%   BMI 18.57 kg/m    Physical Exam  Constitutional: Appears well-developed and well-nourished.  Psych: Affect appropriate to situation Eyes: No scleral injection HENT: No OP obstruction, he does have a head laceration which has been stapled Head: Normocephalic.  Cardiovascular: Normal rate and regular rhythm.  Respiratory: Effort normal and breath sounds normal to anterior ascultation GI: Soft.  No distension. There is no tenderness.  Skin: WDI  Neuro: Mental Status: Patient is awake, alert, oriented to person, does not know where he is and is unable to give the month or year, is unable to give his age. Cranial Nerves: II: He has a left hemianopia. Pupils are equal, round, and reactive to light.   III,IV, VI: He has a right gaze preference VII: He has left facial weakness Motor: His left side has increased tone with no movement  sensory: He is unable to feel me touching his left side, if I do provide noxious stimulation with nailbed pressure, he senses it as occurring at his shoulder  Cerebellar: Does not perform  I have reviewed labs in epic and the pertinent results are: Results for orders placed or performed during the hospital encounter of 06/05/22 (from the past 24 hour(s))  Basic metabolic panel     Status: Abnormal   Collection Time: 06/06/22 12:10 AM  Result Value Ref Range   Sodium 142 135 - 145 mmol/L   Potassium 4.1 3.5 - 5.1 mmol/L   Chloride 109 98 - 111 mmol/L   CO2 20 (L) 22 - 32 mmol/L   Glucose, Bld 117 (H) 70 - 99 mg/dL   BUN 30 (H) 8 - 23 mg/dL   Creatinine, Ser 1.62 (H) 0.61 - 1.24 mg/dL    Calcium 9.2 8.9 - 10.3 mg/dL   GFR, Estimated 40 (L) >60 mL/min   Anion gap 13 5 - 15  CBC with Differential     Status: Abnormal   Collection Time: 06/06/22 12:10 AM  Result Value Ref Range   WBC 13.6 (H) 4.0 - 10.5 K/uL   RBC 3.45 (L) 4.22 - 5.81 MIL/uL  Hemoglobin 11.2 (L) 13.0 - 17.0 g/dL   HCT 33.8 (L) 39.0 - 52.0 %   MCV 98.0 80.0 - 100.0 fL   MCH 32.5 26.0 - 34.0 pg   MCHC 33.1 30.0 - 36.0 g/dL   RDW 12.7 11.5 - 15.5 %   Platelets 137 (L) 150 - 400 K/uL   nRBC 0.0 0.0 - 0.2 %   Neutrophils Relative % 81 %   Neutro Abs 11.0 (H) 1.7 - 7.7 K/uL   Lymphocytes Relative 11 %   Lymphs Abs 1.5 0.7 - 4.0 K/uL   Monocytes Relative 6 %   Monocytes Absolute 0.7 0.1 - 1.0 K/uL   Eosinophils Relative 1 %   Eosinophils Absolute 0.2 0.0 - 0.5 K/uL   Basophils Relative 0 %   Basophils Absolute 0.0 0.0 - 0.1 K/uL   Immature Granulocytes 1 %   Abs Immature Granulocytes 0.07 0.00 - 0.07 K/uL  Protime-INR     Status: None   Collection Time: 06/06/22 12:10 AM  Result Value Ref Range   Prothrombin Time 13.6 11.4 - 15.2 seconds   INR 1.1 0.8 - 1.2  APTT     Status: None   Collection Time: 06/06/22 12:10 AM  Result Value Ref Range   aPTT 28 24 - 36 seconds      I have reviewed the images obtained: CT head-large intraparenchymal hematoma in the right parietal region  Primary Diagnosis:  Nontraumatic intracerebral hemorrhage in hemisphere, cortical  Secondary Diagnosis: Cerebral edema and CKD Stage 3 (GFR 30-59) CAD    Impression: 88 year old male with intraparenchymal hematoma.  My suspicion is that the hemorrhage is the cause of the fall as opposed to result.  He will need admission to the ICU with close monitoring and blood pressure control.  My suspicion is that he may survive this, though with severe disability.  I discussed this with his daughter who indicates that he would want to be DNR in the situation.  This is in alignment with his living will.   Plan: 1) Admit to  ICU 2) no antiplatelets or anticoagulants 3) blood pressure control with goal systolic [phone] 4) Frequent neuro checks 5) If symptoms worsen or there is decreased mental status, repeat stat head CT 6) PT,OT,ST    This patient is critically ill and at significant risk of neurological worsening, death and care requires constant monitoring of vital signs, hemodynamics,respiratory and cardiac monitoring, neurological assessment, discussion with family, other specialists and medical decision making of high complexity. I spent 60 minutes of neurocritical care time  in the care of  this patient. This was time spent independent of any time provided by nurse practitioner or PA.  Roland Rack, MD [facility] [phone]  If 7pm- 7am, please page neurology on call as listed in [place].

## 2022-06-06 NOTE — Progress Notes (Signed)
Echocardiogram not complete patient is working with speech.  Ryan Humphrey

## 2022-06-06 NOTE — Progress Notes (Signed)
Pt in a-fib with rate of 80-90. SBP 120-130. MD Leonel Ramsay) made aware. MD stated to monitor rate and notify if rate increased.

## 2022-06-06 NOTE — Progress Notes (Addendum)
STROKE TEAM PROGRESS NOTE   INTERVAL HISTORY His eldest daughter is at the bedside.  Patient is somnolent but rouses to voice. He is oriented to self and his daughter. He has significant L sided deficits, neglect. R gaze preference with eye movement not crossing midline.  Vitals:   06/06/22 0740 06/06/22 0745 06/06/22 0800 06/06/22 0815  BP: 126/70 (!) 140/46 130/84 136/71  Pulse: 95 91 86 85  Resp: '18 19 18 14  '$ Temp:   98.2 F (36.8 C)   TempSrc:   Axillary   SpO2: 100% 100% 100% 100%  Weight:      Height:       CBC:  Recent Labs  Lab 06/06/22 0010  WBC 13.6*  NEUTROABS 11.0*  HGB 11.2*  HCT 33.8*  MCV 98.0  PLT 458*   Basic Metabolic Panel:  Recent Labs  Lab 06/06/22 0010  NA 142  K 4.1  CL 109  CO2 20*  GLUCOSE 117*  BUN 30*  CREATININE 1.62*  CALCIUM 9.2    IMAGING past 24 hours CT Head Wo Contrast  Result Date: 06/06/2022 CLINICAL DATA:  Head trauma, minor (Age >= 65y); Neck trauma (Age >= 65y). Fall, altered mental status EXAM: CT HEAD WITHOUT CONTRAST CT CERVICAL SPINE WITHOUT CONTRAST TECHNIQUE: Multidetector CT imaging of the head and cervical spine was performed following the standard protocol without intravenous contrast. Multiplanar CT image reconstructions of the cervical spine were also generated. RADIATION DOSE REDUCTION: This exam was performed according to the departmental dose-optimization program which includes automated exposure control, adjustment of the mA and/or kV according to patient size and/or use of iterative reconstruction technique. COMPARISON:  None Available. FINDINGS: CT HEAD FINDINGS Brain: Acute intraparenchymal hematoma is seen within the right frontoparietal cortex measuring 3.0 x 3.1 x 4.9 cm (volume = 24 cm^3) on coronal image # 45 and sagittal image # 25 with a a moderate amount of surrounding cytotoxic edema. There is mild mass effect with effacement of the overlying sulci and adjacent atrium of the right lateral ventricle. The  hemorrhage involves the pre and postcentral gyri of the right cerebral hemisphere. No midline shift. Mild parenchymal volume loss is commensurate with the patient's age. Mild periventricular white matter changes are present likely reflecting the sequela of small vessel ischemia. Ventricular size is commensurate with the degree of parenchymal volume loss. Cerebellum is unremarkable. Vascular: No hyperdense vessel or unexpected calcification. Skull: Normal. Negative for fracture or focal lesion. Sinuses/Orbits: No acute finding. Other: Mastoid air cells and middle ear cavities are clear. Moderate right parietal scalp hematoma and superficial scalp laceration noted. CT CERVICAL SPINE FINDINGS Alignment: 3 mm anterolisthesis C5-6 is likely degenerative in nature and is stable since prior examination. Otherwise normal cervical alignment. Skull base and vertebrae: Craniocervical alignment is normal. The atlantodental interval is not widened. No acute fracture of the cervical spine. Vertebral body height is preserved. Soft tissues and spinal canal: No prevertebral fluid or swelling. No visible canal hematoma. No canal hematoma. No prevertebral soft tissue swelling or paraspinal fluid collection identified. Moderate atherosclerotic calcification involving the left carotid bulb. 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 There is diffuse intervertebral disc space narrowing and endplate remodeling throughout the cervical spine in keeping with changes of advanced degenerative disc disease. Prevertebral soft tissues are not thickened on sagittal reformats. Posterior disc osteophyte complex ease in combination with mild congenital narrowing of the spinal canal results in moderate  central canal stenosis at C3-4 and C5-6. Milder, eccentric narrowing noted at C4-5 secondary to combination right paracentral disc osteophyte complex and  asymmetric facet arthrosis. Severe multilevel neuroforaminal narrowing is present secondary to combination uncovertebral and facet arthrosis, most severe at C3-4 and C4-5, right greater than left, Upper chest: Negative. Other: None IMPRESSION: 1. Acute intraparenchymal hematoma within the right frontoparietal cortex with moderate surrounding cytotoxic edema and mild mass effect. No midline shift. Hematoma volume 24 cc. Hematoma involves the pre and postcentral gyri of the right cerebral cortex. 2. Moderate right parietal scalp hematoma and superficial scalp laceration. 3. No acute fracture or listhesis of the cervical spine. 4. Advanced degenerative disc and degenerative joint disease resulting in multilevel moderate to severe central canal stenosis and neuroforaminal narrowing, most severe at C3-4 and C4-5. Electronically Signed   By: Fidela Salisbury M.D.   On: 06/06/2022 00:17   CT Cervical Spine Wo Contrast  Result Date: 06/06/2022 CLINICAL DATA:  Head trauma, minor (Age >= 65y); Neck trauma (Age >= 65y). Fall, altered mental status EXAM: CT HEAD WITHOUT CONTRAST CT CERVICAL SPINE WITHOUT CONTRAST TECHNIQUE: Multidetector CT imaging of the head and cervical spine was performed following the standard protocol without intravenous contrast. Multiplanar CT image reconstructions of the cervical spine were also generated. RADIATION DOSE REDUCTION: This exam was performed according to the departmental dose-optimization program which includes automated exposure control, adjustment of the mA and/or kV according to patient size and/or use of iterative reconstruction technique. COMPARISON:  None Available. FINDINGS: CT HEAD FINDINGS Brain: Acute intraparenchymal hematoma is seen within the right frontoparietal cortex measuring 3.0 x 3.1 x 4.9 cm (volume = 24 cm^3) on coronal image # 45 and sagittal image # 25 with a a moderate amount of surrounding cytotoxic edema. There is mild mass effect with effacement of the  overlying sulci and adjacent atrium of the right lateral ventricle. The hemorrhage involves the pre and postcentral gyri of the right cerebral hemisphere. No midline shift. Mild parenchymal volume loss is commensurate with the patient's age. Mild periventricular white matter changes are present likely reflecting the sequela of small vessel ischemia. Ventricular size is commensurate with the degree of parenchymal volume loss. Cerebellum is unremarkable. Vascular: No hyperdense vessel or unexpected calcification. Skull: Normal. Negative for fracture or focal lesion. Sinuses/Orbits: No acute finding. Other: Mastoid air cells and middle ear cavities are clear. Moderate right parietal scalp hematoma and superficial scalp laceration noted. CT CERVICAL SPINE FINDINGS Alignment: 3 mm anterolisthesis C5-6 is likely degenerative in nature and is stable since prior examination. Otherwise normal cervical alignment. Skull base and vertebrae: Craniocervical alignment is normal. The atlantodental interval is not widened. No acute fracture of the cervical spine. Vertebral body height is preserved. Soft tissues and spinal canal: No prevertebral fluid or swelling. No visible canal hematoma. No canal hematoma. No prevertebral soft tissue swelling or paraspinal fluid collection identified. Moderate atherosclerotic calcification involving the left carotid bulb. 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 There is diffuse intervertebral disc space narrowing and endplate remodeling throughout the cervical spine in keeping with changes of advanced degenerative disc disease. Prevertebral soft tissues are not thickened on sagittal reformats. Posterior disc osteophyte complex ease in combination with mild congenital narrowing of the spinal canal results in moderate central canal stenosis at C3-4 and C5-6. Milder, eccentric narrowing noted at C4-5  secondary to combination right paracentral disc osteophyte complex and asymmetric facet arthrosis. Severe  multilevel neuroforaminal narrowing is present secondary to combination uncovertebral and facet arthrosis, most severe at C3-4 and C4-5, right greater than left, Upper chest: Negative. Other: None IMPRESSION: 1. Acute intraparenchymal hematoma within the right frontoparietal cortex with moderate surrounding cytotoxic edema and mild mass effect. No midline shift. Hematoma volume 24 cc. Hematoma involves the pre and postcentral gyri of the right cerebral cortex. 2. Moderate right parietal scalp hematoma and superficial scalp laceration. 3. No acute fracture or listhesis of the cervical spine. 4. Advanced degenerative disc and degenerative joint disease resulting in multilevel moderate to severe central canal stenosis and neuroforaminal narrowing, most severe at C3-4 and C4-5. Electronically Signed   By: Fidela Salisbury M.D.   On: 06/06/2022 00:17    PHYSICAL EXAM General: in no acute distress HEENT: normocephalic and atraumatic Cardiovascular: tachycardic, irregular Respiratory: normal respiratory effort and on 2 L nasal cannula Gastrointestinal: non-tender and non-distended  Mental Status: Chung Chagoya Romo is somnolent; he is oriented to person and daughter. Speech was clear and slurred without evidence of aphasia. He was able to follow only simple commands.  Cranial Nerves: III,IV, VI: no ptosis, extra-ocular motions  with R gaze preference, does not cross midline when looking to left XII: midline tongue extension without atrophy and without fasciculations  Motor: Right : Upper extremity   5/5 full power  Lower extremity   5/5 full power  Left: Upper extremity   0/5 no muscle contraction Lower extremity   1/5 flicker of contraction   Tone and bulk: normal tone; with gross atrophy throughout  Sensory: sensation to light touch absent on L leg  Gait: not observed during  encounter  ASSESSMENT/PLAN NHAN QUALLEY is a 88 y.o. male with history of vascular dementia, hyperlipidemia who fell this evening.  He was getting out of bed and then just fell.  He is not sure why he fell.  He is not certain of where he is, or why he is in the hospital.  He lives in assisted living.   He was noted to be severely weak on the left side on arrival to the hospital and therefore taken to CT which revealed a sizable intraparenchymal hematoma in the right parietal region.  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 Moderate right parietal scalp hematoma and superficial scalp laceration.  MRI Interval enlargement of the right cerebral hematoma, now measuring nearly 6 x 7 cm. Small volume intraventricular blood clot layering in the left lateral ventricle which is non progressed. MRA nondiagnostic due to motion Repeat CT head ordered 2D Echo pending LDL pending HgbA1c pending VTE prophylaxis - SCDs aspirin 81 mg daily prior to admission, now on No antithrombotic due to bleed Therapy recommendations:  pending  Palliative care consulted to begin [place] discussion Disposition:  pending, daughter stated that pt would not want life without quality  Hypertension Home meds:  isosorbide mononitrate Stable now Off cleviprex for now BP goal 120-140 given unstable ICH Long-term BP goal normotensive  Hyperlipidemia Home meds:  atorvastatin 80 mg, held on admission LDL pending, goal < 63 Hod off statin now Pt may be SATURN candidate but need GOC discussion first.   Dysphagia Did not pass swallow Now NPO On IVF @ 69 Speech on board  Other Stroke Risk Factors Advanced Age >/= 50  Coronary artery disease  Other Active Problems Depression, continue sertraline  once able to have PO Leukocytosis WBC 13.6 CKD 3A, creatinine  1.62 Mild thrombocytopenia platelet 137 Tachycardia  Hospital day # 0   ATTENDING NOTE: I reviewed above note and agree with the assessment and plan. Pt was seen and examined.   88 years old male with history of hypertension, hyperlipidemia, CAD, CKD, vascular dementia admitted for left-sided weakness and fall.  CT showed right parietal ICH.  MRI this morning showed enlarged ICH with small IVH.  Will repeat CT this afternoon.  2D echo pending, LDL and A1c pending.  Creatinine 1.62, WBC 13.6, platelet 137.  On exam, patient very lethargic, able to answer orientation questions with prompt in delayed fashion.  Orientated to people and place but not to time.  Positive speech, able to follow simple commands in delayed fashion.  Right gaze preference, not past midline.  Left facial droop, left upper extremity flaccid, left lower extremity 1/5.  Spontaneous moving on the right. Sensation, coordination not corporative and gait not tested.  Etiology for patient ICH not quite clear, concerning for CAA.  MRI showed enlarged hematoma, will set BP goal 1 20-1 40.  Repeat CT this afternoon.  Patient did not pass swallow, on IV fluid. I had long discussion with daughter at bedside, updated pt current condition, treatment plan and potential prognosis, and answered all the questions.  She expressed understanding and appreciation.  She stated that the patient has limited want to live in debilitated state, leaning towards to comfort care measures if patient not have good quality of life.  Will involve palliative care.  For detailed assessment and plan, please refer to above/below as I have made changes wherever appropriate.   Rosalin Hawking, MD PhD Stroke Neurology 06/06/2022 2:27 PM  This patient is critically ill due to large right ICH with small IVH, hypertension, dysphagia and at significant risk of neurological worsening, death form hematoma expansion, brain herniation, cerebral edema, aspiration pneumonia,  sepsis, respiratory failure. This patient's care requires constant monitoring of vital signs, hemodynamics, respiratory and cardiac monitoring, review of multiple databases, neurological assessment, discussion with family, other specialists and medical decision making of high complexity. I spent 40 minutes of neurocritical care time in the care of this patient.    To contact Stroke Continuity provider, please refer to [email or web address] After hours, contact General Neurology

## 2022-06-06 NOTE — Progress Notes (Signed)
SLP Cancellation Note  Patient Details Name: Ryan Humphrey MRN: [id number] DOB: January 28, 1929   Cancelled treatment:       Reason Eval/Treat Not Completed: Patient at procedure or test/unavailable;Other (comment) (SLP will return later this date schedule permitting)   Sonia Baller, MA, CCC-SLP Speech Therapy

## 2022-06-06 NOTE — Evaluation (Signed)
Clinical/Bedside Swallow Evaluation Patient Details  Name: Ryan Humphrey MRN: [id number] Date of Birth: 1928-08-02  Today's Date: 06/06/2022 Time: SLP Start Time (ACUTE ONLY): 47 SLP Stop Time (ACUTE ONLY): 1400 SLP Time Calculation (min) (ACUTE ONLY): 20 min  Past Medical History:  Past Medical History:  Diagnosis Date   Anemia    Arthritis    arthritis,osteopenia,"spinal stenosis"   Bladder neck obstruction 03/28/2011   CAD (coronary artery disease)    Cancer ([place]) 12-06-12   Prostate cancer'98   CKD (chronic kidney disease) stage 3, GFR 30-59 ml/min ([place]) 08/17/2017   Depression    Essential hypertension, benign 04/10/2009   GERD (gastroesophageal reflux disease)    GERD without esophagitis 11/05/2016   H/O hiatal hernia    H/O vertigo 12-06-12   none recent   Hyperlipidemia    IBS (irritable bowel syndrome)    Mixed hyperlipidemia 04/10/2009   Neuropathy    Osteopenia    Peripheral neuropathy 12-06-12   peripheral neuropathy   Rhinitis    RLS (restless legs syndrome)    Vascular dementia ([place]) 09/30/2019   Vitamin B12 deficiency    Past Surgical History:  Past Surgical History:  Procedure Laterality Date   APPENDECTOMY     CARPAL TUNNEL RELEASE     both hands   cataract surgery Left 12-06-12   recent surgery   CHOLECYSTECTOMY     COLONOSCOPY WITH PROPOFOL N/A 12/25/2012   Procedure: COLONOSCOPY WITH PROPOFOL;  Surgeon: Garlan Fair, MD;  Location: [facility];  Service: Endoscopy;  Laterality: N/A;   CORONARY STENT INTERVENTION N/A 03/13/2020   Procedure: CORONARY STENT INTERVENTION;  Surgeon: Nelva Bush, MD;  Location: [facility];  Service: Cardiovascular;  Laterality: N/A;   ESOPHAGOGASTRODUODENOSCOPY (EGD) WITH PROPOFOL N/A 12/25/2012   Procedure: ESOPHAGOGASTRODUODENOSCOPY (EGD) WITH PROPOFOL;  Surgeon: Garlan Fair, MD;  Location: [facility];  Service: Endoscopy;  Laterality: N/A;   INTRAVASCULAR ULTRASOUND/IVUS N/A 03/13/2020    Procedure: Intravascular Ultrasound/IVUS;  Surgeon: Nelva Bush, MD;  Location: [facility];  Service: Cardiovascular;  Laterality: N/A;   LEFT HEART CATH AND CORONARY ANGIOGRAPHY N/A 03/13/2020   Procedure: LEFT HEART CATH AND CORONARY ANGIOGRAPHY;  Surgeon: Nelva Bush, MD;  Location: [facility];  Service: Cardiovascular;  Laterality: N/A;   PROSTATE SURGERY  12-06-12   TONSILLECTOMY     TOTAL KNEE ARTHROPLASTY Right 11/25/2014   Procedure: RIGHT TOTAL KNEE ARTHROPLASTY;  Surgeon: Melrose Nakayama, MD;  Location: [facility];  Service: Orthopedics;  Laterality: Right;   HPI:  Pt is a 88yo male who fell out of bed at his ALF. CT revealed an intrparenchymal hematoma in R parietal region PMH: vascular dementia, hyperlipidemia   Assessment / Plan / Recommendation  Clinical Impression  Patient presents with clinical s/s of dysphagia as per this bedside swallow evaluation with main factor at this point being his lethargy. Patient's daughter was present during evaluation and she denied patient having any prior swallowing difficulties. Patient was awake and alert but was drowsy and after approximately 10-15 minutes he became more somnolent. He exhibited a significant right gaze preference and despite SLP and daughter's attempts, were unable to cue patient to turn head even slightly to the left. His speech was dysarthric and his voice was low in intensity but clear. He exhibited a weak, congested sounding cough. After oral care, SLP assessed patient's PO toleration of teaspoon sips of thin liquids (water). Patient exhibited poor awareness of bolus, poor oral control, oral holding, delayed  swallow initiation and anterior spillage of water. No overt s/s aspiration or penetration observed but trials limited to two teaspoons of water only. At this time, patient was becoming noticeably more somnolent with verbal responses less frequent. SLP recommending continue NPO but allow small amounts water via  spoon only when patient alert and after oral care. Daughter who was in room educated and in agreement with POC. SLP will f/u next date to determine PO readiness. SLP Visit Diagnosis: Dysphagia, unspecified (R13.10)    Aspiration Risk  Moderate aspiration risk;Risk for inadequate nutrition/hydration    Diet Recommendation NPO;Other (Comment) (small amounts water via spoon PRN)   Medication Administration: Via alternative means Postural Changes: Seated upright at 90 degrees    Other  Recommendations Oral Care Recommendations: Oral care BID;Oral care prior to ice chip/H20;Staff/trained caregiver to provide oral care    Recommendations for follow up therapy are one component of a multi-disciplinary discharge planning process, led by the attending physician.  Recommendations may be updated based on patient status, additional functional criteria and insurance authorization.  Follow up Recommendations Other (comment) (TBD)      Assistance Recommended at Discharge    Functional Status Assessment Patient has had a recent decline in their functional status and demonstrates the ability to make significant improvements in function in a reasonable and predictable amount of time.  Frequency and Duration min 2x/week  2 weeks       Prognosis Prognosis for Safe Diet Advancement: Good Barriers to Reach Goals: Cognitive deficits      Swallow Study   General Date of Onset: 06/06/22 HPI: Family noticing he is initiating more swallows and would like 1 more eval before final decision on comfort care Type of Study: Bedside Swallow Evaluation Previous Swallow Assessment: none found Diet Prior to this Study: NPO Temperature Spikes Noted: No Respiratory Status: Room air History of Recent Intubation: No Behavior/Cognition: Alert;Cooperative;Confused;Lethargic/Drowsy;Requires cueing Oral Cavity Assessment: Within Functional Limits Oral Care Completed by SLP: Yes Oral Cavity - Dentition: Adequate  natural dentition Self-Feeding Abilities: Total assist Patient Positioning: Upright in bed Baseline Vocal Quality: Low vocal intensity;Normal Volitional Cough: Congested;Weak Volitional Swallow: Unable to elicit    Oral/Motor/Sensory Function Overall Oral Motor/Sensory Function: Mild impairment Facial ROM: Reduced left Facial Strength: Reduced left Lingual Strength: Reduced   Ice Chips     Thin Liquid Thin Liquid: Impaired Presentation: Spoon Oral Phase Impairments: Reduced labial seal;Poor awareness of bolus;Reduced lingual movement/coordination Oral Phase Functional Implications: Prolonged oral transit;Oral holding Pharyngeal  Phase Impairments: Suspected delayed Swallow    Nectar Thick     Honey Thick     Puree Puree: Not tested   Solid     Solid: Not tested     Sonia Baller, MA, CCC-SLP Speech Therapy

## 2022-06-07 ENCOUNTER — Inpatient Hospital Stay (HOSPITAL_COMMUNITY): Payer: Medicare Other

## 2022-06-07 DIAGNOSIS — Z66 Do not resuscitate: Secondary | ICD-10-CM | POA: Diagnosis not present

## 2022-06-07 DIAGNOSIS — Z515 Encounter for palliative care: Secondary | ICD-10-CM

## 2022-06-07 DIAGNOSIS — Z7189 Other specified counseling: Secondary | ICD-10-CM | POA: Diagnosis not present

## 2022-06-07 DIAGNOSIS — I6389 Other cerebral infarction: Secondary | ICD-10-CM | POA: Diagnosis not present

## 2022-06-07 DIAGNOSIS — D649 Anemia, unspecified: Secondary | ICD-10-CM

## 2022-06-07 DIAGNOSIS — I611 Nontraumatic intracerebral hemorrhage in hemisphere, cortical: Secondary | ICD-10-CM | POA: Diagnosis not present

## 2022-06-07 DIAGNOSIS — N289 Disorder of kidney and ureter, unspecified: Secondary | ICD-10-CM

## 2022-06-07 LAB — CBC
HCT: 37 % — ABNORMAL LOW (ref 39.0–52.0)
Hemoglobin: 12.2 g/dL — ABNORMAL LOW (ref 13.0–17.0)
MCH: 32 pg (ref 26.0–34.0)
MCHC: 33 g/dL (ref 30.0–36.0)
MCV: 97.1 fL (ref 80.0–100.0)
Platelets: 175 10*3/uL (ref 150–400)
RBC: 3.81 MIL/uL — ABNORMAL LOW (ref 4.22–5.81)
RDW: 12.9 % (ref 11.5–15.5)
WBC: 20.9 10*3/uL — ABNORMAL HIGH (ref 4.0–10.5)
nRBC: 0 % (ref 0.0–0.2)

## 2022-06-07 LAB — ECHOCARDIOGRAM COMPLETE
AV Mean grad: 5 mmHg
AV Peak grad: 10.4 mmHg
Ao pk vel: 1.61 m/s
Area-P 1/2: 4.36 cm2
Height: 72 in
Weight: 2201.07 oz

## 2022-06-07 LAB — LIPID PANEL
Cholesterol: 111 mg/dL (ref 0–200)
HDL: 38 mg/dL — ABNORMAL LOW (ref >40)
LDL Cholesterol: 56 mg/dL (ref 0–99)
Total CHOL/HDL Ratio: 2.9 RATIO
Triglycerides: 87 mg/dL (ref <150)
VLDL: 17 mg/dL (ref 0–40)

## 2022-06-07 LAB — BASIC METABOLIC PANEL
Anion gap: 10 (ref 5–15)
BUN: 25 mg/dL — ABNORMAL HIGH (ref 8–23)
CO2: 28 mmol/L (ref 22–32)
Calcium: 9.3 mg/dL (ref 8.9–10.3)
Chloride: 106 mmol/L (ref 98–111)
Creatinine, Ser: 1.34 mg/dL — ABNORMAL HIGH (ref 0.61–1.24)
GFR, Estimated: 50 mL/min — ABNORMAL LOW (ref >60)
Glucose, Bld: 132 mg/dL — ABNORMAL HIGH (ref 70–99)
Potassium: 3.9 mmol/L (ref 3.5–5.1)
Sodium: 144 mmol/L (ref 135–145)

## 2022-06-07 MED ORDER — BIOTENE DRY MOUTH MT LIQD: 15.0000 mL | OROMUCOSAL | Status: DC | PRN

## 2022-06-07 MED ORDER — METOPROLOL TARTRATE 5 MG/5ML IV SOLN: 5.0000 mg | Freq: Four times a day (QID) | INTRAVENOUS | Status: DC | PRN

## 2022-06-07 MED ORDER — LORAZEPAM 1 MG PO TABS: 1.0000 mg | ORAL_TABLET | ORAL | Status: DC | PRN

## 2022-06-07 MED ORDER — HALOPERIDOL 0.5 MG PO TABS: 0.5000 mg | ORAL_TABLET | ORAL | Status: DC | PRN

## 2022-06-07 MED ORDER — GLYCOPYRROLATE 0.2 MG/ML IJ SOLN: 0.2000 mg | INTRAMUSCULAR | Status: DC | PRN

## 2022-06-07 MED ORDER — ONDANSETRON HCL 4 MG/2ML IJ SOLN: 4.0000 mg | Freq: Four times a day (QID) | INTRAMUSCULAR | Status: DC | PRN

## 2022-06-07 MED ORDER — POLYVINYL ALCOHOL 1.4 % OP SOLN: 1.0000 [drp] | Freq: Four times a day (QID) | OPHTHALMIC | Status: DC | PRN

## 2022-06-07 MED ORDER — DILTIAZEM LOAD VIA INFUSION
15.0000 mg | Freq: Once | INTRAVENOUS | Status: AC
  Administered 2022-06-07: 15 mg via INTRAVENOUS
  Filled 2022-06-07: qty 15

## 2022-06-07 MED ORDER — LORAZEPAM 2 MG/ML PO CONC: 1.0000 mg | ORAL | Status: DC | PRN

## 2022-06-07 MED ORDER — ONDANSETRON 4 MG PO TBDP: 4.0000 mg | ORAL_TABLET | Freq: Four times a day (QID) | ORAL | Status: DC | PRN

## 2022-06-07 MED ORDER — MORPHINE SULFATE (PF) 2 MG/ML IV SOLN
1.0000 mg | INTRAVENOUS | Status: DC | PRN
  Administered 2022-06-09 (×2): 1 mg via INTRAVENOUS
  Filled 2022-06-07 (×2): qty 1

## 2022-06-07 MED ORDER — GLYCOPYRROLATE 1 MG PO TABS: 1.0000 mg | ORAL_TABLET | ORAL | Status: DC | PRN

## 2022-06-07 MED ORDER — DILTIAZEM HCL-DEXTROSE 125-5 MG/125ML-% IV SOLN (PREMIX)
5.0000 mg/h | INTRAVENOUS | Status: DC
  Administered 2022-06-07: 5 mg/h via INTRAVENOUS
  Filled 2022-06-07: qty 125

## 2022-06-07 MED ORDER — GLYCOPYRROLATE 0.2 MG/ML IJ SOLN
0.2000 mg | INTRAMUSCULAR | Status: DC | PRN
  Administered 2022-06-09: 0.2 mg via INTRAVENOUS
  Filled 2022-06-07: qty 1

## 2022-06-07 MED ORDER — LORAZEPAM 2 MG/ML IJ SOLN
1.0000 mg | INTRAMUSCULAR | Status: DC | PRN
  Administered 2022-06-09 (×2): 1 mg via INTRAVENOUS
  Filled 2022-06-07 (×2): qty 1

## 2022-06-07 MED ORDER — HALOPERIDOL LACTATE 5 MG/ML IJ SOLN: 0.5000 mg | INTRAMUSCULAR | Status: DC | PRN

## 2022-06-07 MED ORDER — HALOPERIDOL LACTATE 2 MG/ML PO CONC: 0.5000 mg | ORAL | Status: DC | PRN

## 2022-06-07 NOTE — Consult Note (Signed)
Palliative Care Consult Note                                  Date: 06/07/2022   Patient Name: Ryan Humphrey  DOB: 02/21/1929  MRN: [id number]  Age / Sex: 88 y.o., male  PCP: Mast, Man X, NP Referring Physician: Stroke, Md, MD  Reason for Consultation: Establishing goals of care  HPI/Patient Profile: 88 y.o. male  with past medical history of vascular dementia, hyperlipidemia who fell this evening while getting out of bed. He was not certain of where he is, or why he is in the hospital.  He lives in assisted living at [facility].   He was noted to be severely weak on the left side on arrival to the hospital and therefore taken to CT which revealed a sizable intraparenchymal hematoma in the right parietal region. He was admitted on 06/05/2022 with right parietal ICH with small IVH with unclear etiology concerning for CAA, leukocytosis, hyperlipidemia, dysphagia, and others.  PMT was consulted for [place] conversations.  Past Medical History:  Diagnosis Date   Anemia    Arthritis    arthritis,osteopenia,"spinal stenosis"   Bladder neck obstruction 03/28/2011   CAD (coronary artery disease)    Cancer ([employer]) 12-06-12   Prostate cancer'98   CKD (chronic kidney disease) stage 3, GFR 30-59 ml/min ([place]) 08/17/2017   Depression    Essential hypertension, benign 04/10/2009   GERD (gastroesophageal reflux disease)    GERD without esophagitis 11/05/2016   H/O hiatal hernia    H/O vertigo 12-06-12   none recent   Hyperlipidemia    IBS (irritable bowel syndrome)    Mixed hyperlipidemia 04/10/2009   Neuropathy    Osteopenia    Peripheral neuropathy 12-06-12   peripheral neuropathy   Rhinitis    RLS (restless legs syndrome)    Vascular dementia ([place]) 09/30/2019   Vitamin B12 deficiency     Subjective:   This NP Walden Field reviewed medical records, received report from team, assessed the patient and then meet at the patient's bedside to  discuss diagnosis, prognosis, GOC, EOL wishes disposition and options.  I met with the patient at the bedside who is unable to meaningfully communicate.  No family was present at that time.  I called the patient's daughters via telephone and we had a family meeting impromptu over the phone.   Concept of Palliative Care was introduced as specialized medical care for people and their families living with serious illness.  If focuses on providing relief from the symptoms and stress of a serious illness.  The goal is to improve quality of life for both the patient and the family. Values and goals of care important to patient and family were attempted to be elicited.  Created space and opportunity for patient  and family to explore thoughts and feelings regarding current medical situation   Natural trajectory and current clinical status were discussed. Questions and concerns addressed. Patient  encouraged to call with questions or concerns.    Patient/Family Understanding of Illness: They understand he had a brain bleed.  They stated neurology says that the way he is today is how he will be indefinitely.  We had some more discussion about the pathophysiology of a stroke, the likely prognosis being very poor and if he survived he would require a feeding tube, ongoing artificial support indefinitely.  Patient Values: Quality of life  Goals: They have  elected to transition to comfort care and involvement with hospice at this point.  Today's Discussion: In addition to discussions described appropriate have significant and ongoing discussions on various topics.  They state that he lives at a friend's home.  We discussed his prognosis being very poor.  They are clear that he has been very clear for some time that he would not want ongoing support, would not want a life where he is dependent on others.  He very much values his quality of life.  He would not want to be an appoint where he needs others to  survive.  We discussed possible options including ongoing aggressive care, medical management only, comfort care, and the various options in between.  I described comfort care and that the patient would no longer receive aggressive medical interventions such as continuous vital signs, lab work, radiology testing, or medications not focused on comfort. All care would focus on how the patient is looking and feeling. This would include management of any symptoms that may cause discomfort, pain, shortness of breath, cough, nausea, agitation, anxiety, and/or secretions etc. Symptoms would be managed with medications and other non-pharmacological interventions such as spiritual support if requested, repositioning, music therapy, or therapeutic listening. Family verbalized understanding and appreciation.  They state that he would want this and they would like to transition him to comfort care at this point.  No further heroic measures, no further attempts to fix what is becoming increasingly clear to be unfixable and instead focus on comfort, quality, dignity as he approaches the end of life.  He would not want a feeding tube.  They understand this means he would not be able to eat or drink, other than comfort measures such as swabs.  We also discussed the possibility of transitioning to hospice for end-of-life experience in a familiar environment at friend's home with home hospice involved. I described hospice as a service for patients who have a life expectancy of 6 months or less. The goal of hospice is the preservation of dignity and quality at the end phases of life. Under hospice care, the focus changes from curative to symptom relief.  They have agreed that this is the option that they would prefer.  They feel that he would be easier for friends and family to come in out of friend's home in the hospital.  Additionally he would be in a more comfortable, familiar surrounding with friends and staff know him and  would like to see him.  We agreed that I would transition him to comfort care with orders in the hospital, they will work on contacting friends home to let them know so they can work on reserving a spot in their skilled care unit for him.  I also indicated I would involve social work to discuss with them hospice options and work on getting him transitioned back to friend's home.  I provided emotional and general support through therapeutic listening, empathy, sharing of stories, and other techniques. I answered all questions and addressed all concerns to the best of my ability.  Review of Systems  Unable to perform ROS: Acuity of condition    Objective:   Primary Diagnoses: Present on Admission:  ICH (intracerebral hemorrhage) ([place])   Physical Exam Vitals and nursing note reviewed.  Constitutional:      General: He is not in acute distress.    Appearance: He is ill-appearing.  HENT:     Head: Normocephalic and atraumatic.  Cardiovascular:     Rate  and Rhythm: Normal rate.  Pulmonary:     Effort: Pulmonary effort is normal. No respiratory distress.  Abdominal:     General: Abdomen is flat. Bowel sounds are normal. There is no distension.     Palpations: Abdomen is soft.     Tenderness: There is no abdominal tenderness.  Skin:    General: Skin is warm and dry.  Neurological:     Mental Status: He is alert.     Comments: Unable to meaningfully communicate. Severe left-sided deficit.     Vital Signs:  BP (!) 133/42   Pulse (!) 113   Temp 98.4 F (36.9 C) (Axillary)   Resp (!) 27   Ht 6' (1.829 m)   Wt 62.4 kg   SpO2 99%   BMI 18.66 kg/m   Palliative Assessment/Data: 10%    Advanced Care Planning:   Existing Vynca/ACP Documentation: Advanced directive (dated 04/29/2010)  Primary Decision Maker: NEXT OF KIN  Code Status/Advance Care Planning: DNR  A discussion was had today regarding advanced directives. Concepts specific to code status, artifical feeding  and hydration, continued IV antibiotics and rehospitalization was had.  The difference between a aggressive medical intervention path and a palliative comfort care path for this patient at this time was had.   Decisions/Changes to ACP: Remain DNR Transition to comfort care  Assessment & Plan:   Impression: 88 year old male with chronic health issues and acute presentation as described above.  At this point he is essentially suffered a devastating brain bleed.  He is not expected to make much, if any, improvement.  He is unable to eat or drink as he failed a swallow eval.  He has severe left-sided deficit.  He would likely require ongoing 24-hour nursing care indefinitely.  Family is not very clear that this is not in his wishes.  He has been clear over many years that he would not want a quality of life such as this.  Given this they have elected to transition to comfort care, consult hospice for home hospice at friend's home where he lives long-term.  Long-term prognosis poor to grave.  SUMMARY OF RECOMMENDATIONS   Remain DNR Transition to comfort care TOC referral for "home" hospice at long-term care facility [facility]) Continued emotional and spiritual support of patient and family PMT will continue to follow for symptom management  Symptom Management:  Tylenol 650 mg PR every 4 hours as needed mild pain or fever Robinul 0.2 mg IV every 4 hours as needed excessive secretions Haldol 0.5 mg IV every 4 hours as needed agitation or delirium Ativan 1 mg IV every 4 hours as needed anxiety Morphine 1 mg IV every 2 hours as needed severe pain or dyspnea Zofran 4 mg IV every 6 hours as needed nausea Polyvinyl alcohol 1.4% ophthalmic 1 drop OU 4 times daily as needed  Prognosis:  < 2 weeks  Discharge Planning:  [place] with Hospice   Discussed with: Patient's family, medical team, nursing team, [facility] team    Thank you for allowing [name] to participate in the care of  [place] PMT will continue to support holistically.  Time Total: 90 min  Greater than 50%  of this time was spent counseling and coordinating care related to the above assessment and plan.  Signed by: Walden Field, NP Palliative Medicine Team  Team Phone # [phone] (Nights/Weekends)  06/07/2022, 3:55 PM

## 2022-06-07 NOTE — TOC CAGE-AID Note (Signed)
Transition of Care [facility]) - CAGE-AID Screening   Patient Details  Name: Ryan Humphrey MRN: [id number] Date of Birth: [birth year]  Transition of Care [facility]) CM/SW Contact:    Benard Halsted, [place] Phone Number: 06/07/2022, 11:25 AM   Clinical Narrative: Patient disoriented and unable to participate in screening at this time.    CAGE-AID Screening: Substance Abuse Screening unable to be completed due to: : Patient unable to participate

## 2022-06-07 NOTE — Progress Notes (Addendum)
STROKE TEAM PROGRESS NOTE   INTERVAL HISTORY His youngest daughter is at the bedside. Patient is somnolent but spontaneously speaks. He is oriented x 0. He continues to have significant L sided deficits, neglect. R gaze preference with eye movement not crossing midline.  Vitals:   06/07/22 0730 06/07/22 0745 06/07/22 0800 06/07/22 0815  BP: (!) 133/103 (!) 129/95 (!) 140/61 (!) 121/99  Pulse: (!) 109 97 (!) 107 (!) 115  Resp: (!) 29 (!) 31 (!) 22 (!) 25  Temp:      TempSrc:      SpO2: 98% 98% 98% 97%  Weight:      Height:       CBC:  Recent Labs  Lab 06/06/22 0010 06/07/22 0518  WBC 13.6* 20.9*  NEUTROABS 11.0*  --   HGB 11.2* 12.2*  HCT 33.8* 37.0*  MCV 98.0 97.1  PLT 137* 254    Basic Metabolic Panel:  Recent Labs  Lab 06/06/22 0010 06/07/22 0518  NA 142 144  K 4.1 3.9  CL 109 106  CO2 20* 28  GLUCOSE 117* 132*  BUN 30* 25*  CREATININE 1.62* 1.34*  CALCIUM 9.2 9.3     IMAGING past 24 hours CT HEAD WO CONTRAST (5MM)  Result Date: 06/07/2022 CLINICAL DATA:  Follow-up examination for hemorrhagic stroke. EXAM: CT HEAD WITHOUT CONTRAST TECHNIQUE: Contiguous axial images were obtained from the base of the skull through the vertex without intravenous contrast. RADIATION DOSE REDUCTION: This exam was performed according to the departmental dose-optimization program which includes automated exposure control, adjustment of the mA and/or kV according to patient size and/or use of iterative reconstruction technique. COMPARISON:  Prior CT from 06/05/2022 as well as MRI from earlier the same day. FINDINGS: Brain: Examination degraded by motion artifact. Intraparenchymal hemorrhage positioned at the posterior right cerebral hemisphere measures 6.2 x 5.9 x 4.0 cm (estimated volume 73 mL). This is similar as compared to previous MRI, but enlarged from prior CT. Surrounding edema with regional mass effect and partial effacement of the right lateral ventricle. Intraventricular  extension with small volume hemorrhage within the right greater than left lateral ventricles. Stable ventricular size and morphology without hydrocephalus. No significant midline shift. Basilar cisterns remain patent. No new hemorrhage elsewhere. No other large vessel territory infarct. No visible mass lesion or extra-axial fluid collection. Underlying atrophy with chronic small vessel ischemic disease noted. Vascular: No hyperdense vessel. Scattered vascular calcifications noted within the carotid siphons. Skull: Right occipital scalp contusion with skin staples in place. Calvarium grossly intact. Sinuses/Orbits: Globes orbital soft tissues demonstrate no acute finding. Paranasal sinuses remain largely clear. No significant mastoid effusion. Other: None. IMPRESSION: 1. 6.2 x 5.9 x 4.0 cm (estimated volume 73 mL) intraparenchymal hemorrhage at the posterior right cerebral hemisphere, similar as compared to previous MRI. Surrounding edema with regional mass effect without significant midline shift. Intraventricular extension with small volume hemorrhage within the right greater than left lateral ventricles. Stable ventricular size and morphology without hydrocephalus. 2. No other new acute intracranial abnormality. Electronically Signed   By: Jeannine Boga M.D.   On: 06/07/2022 03:31   MR BRAIN WO CONTRAST  Result Date: 06/06/2022 CLINICAL DATA:  Stroke workup EXAM: MRI HEAD WITHOUT CONTRAST MRA HEAD WITHOUT CONTRAST TECHNIQUE: Multiplanar, multi-echo pulse sequences of the brain and surrounding structures were acquired without intravenous contrast. Angiographic images of the Circle of Willis were acquired using MRA technique without intravenous contrast. COMPARISON:  Head CT from yesterday FINDINGS: MRI HEAD FINDINGS Brain: Large acute hematoma  in the posterior right cerebrum, up to 6.7 cm in length by 5.7 cm craniocaudal, previously 4.7 by 4.3 cm in these dimensions. There is a rim of edema with local  mass effect but no herniation. No visible underlying acute infarct. Small volume intraventricular extension seen in the occipital horn of the left lateral ventricle. There is a background of brain atrophy and chronic small vessel ischemia with ventriculomegaly. No pre-existing hemorrhage seen on gradient sequence. Vascular: Major flow voids are preserved Skull and upper cervical spine: No focal marrow lesion Sinuses/Orbits: Negative Other: Very motion degraded MRA HEAD FINDINGS Very motion degraded, most images being nondiagnostic other than showing grossly patent carotid, vertebral, and proximal branch flow. Prelim sent to neurology teen in epic chat. IMPRESSION: 1. Interval enlargement of the right cerebral hematoma, now measuring nearly 6 x 7 cm. Small volume intraventricular blood clot layering in the left lateral ventricle which is non progressed. There is local mass effect without herniation. 2. No underlying infarct detected. 3. Very motion degraded study, essentially nondiagnostic MRA. Electronically Signed   By: Jorje Guild M.D.   On: 06/06/2022 11:51   MR ANGIO HEAD WO CONTRAST  Result Date: 06/06/2022 CLINICAL DATA:  Stroke workup EXAM: MRI HEAD WITHOUT CONTRAST MRA HEAD WITHOUT CONTRAST TECHNIQUE: Multiplanar, multi-echo pulse sequences of the brain and surrounding structures were acquired without intravenous contrast. Angiographic images of the Circle of Willis were acquired using MRA technique without intravenous contrast. COMPARISON:  Head CT from yesterday FINDINGS: MRI HEAD FINDINGS Brain: Large acute hematoma in the posterior right cerebrum, up to 6.7 cm in length by 5.7 cm craniocaudal, previously 4.7 by 4.3 cm in these dimensions. There is a rim of edema with local mass effect but no herniation. No visible underlying acute infarct. Small volume intraventricular extension seen in the occipital horn of the left lateral ventricle. There is a background of brain atrophy and chronic small  vessel ischemia with ventriculomegaly. No pre-existing hemorrhage seen on gradient sequence. Vascular: Major flow voids are preserved Skull and upper cervical spine: No focal marrow lesion Sinuses/Orbits: Negative Other: Very motion degraded MRA HEAD FINDINGS Very motion degraded, most images being nondiagnostic other than showing grossly patent carotid, vertebral, and proximal branch flow. Prelim sent to neurology teen in epic chat. IMPRESSION: 1. Interval enlargement of the right cerebral hematoma, now measuring nearly 6 x 7 cm. Small volume intraventricular blood clot layering in the left lateral ventricle which is non progressed. There is local mass effect without herniation. 2. No underlying infarct detected. 3. Very motion degraded study, essentially nondiagnostic MRA. Electronically Signed   By: Jorje Guild M.D.   On: 06/06/2022 11:51    PHYSICAL EXAM General: in no acute distress HEENT: normocephalic and atraumatic Cardiovascular: tachycardic, irregular Respiratory: normal respiratory effort and on 2 L nasal cannula Gastrointestinal: non-tender and non-distended  Mental Status: Ryan Humphrey is lethargic, inattentive, and somewhat agitated; he is not oriented to person, place, time, or situation or daughter. Speech was clear and slurred without evidence of aphasia. He was unable to follow any commands.  Cranial Nerves: III,IV, VI: no ptosis, extra-ocular motions  with R gaze preference, does not cross midline when looking to left XII: midline tongue extension without atrophy and without fasciculations  Patient not following commands appropriately  Motor: Right : Upper extremity   5/5 full power  Lower extremity   5/5 full power  Left: Upper extremity   0/5 no muscle contraction Lower extremity   1/5 flicker of contraction ,  with notable hyperreflexia  Tone and bulk: normal tone; with gross atrophy throughout  Gait: not observed during encounter  ASSESSMENT/PLAN Ryan Humphrey is a 88 y.o. male with history of vascular dementia, hyperlipidemia who fell this evening.  He was getting out of bed and then just fell.  He is not sure why he fell.  He is not certain of where he is, or why he is in the hospital.  He lives in assisted living.   He was noted to be severely weak on the left side on arrival to the hospital and therefore taken to CT which revealed a sizable intraparenchymal hematoma in the right parietal region.  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 Moderate right parietal scalp hematoma and superficial scalp laceration.  MRI Interval enlargement of the right cerebral hematoma, now measuring nearly 6 x 7 cm. Small volume intraventricular blood clot layering in the left lateral ventricle which is non progressed. MRA nondiagnostic due to motion Repeat CT head showed 6.2 x 5.9 x 4.0 cm (estimated volume 73 mL) intraparenchymal hemorrhage at the posterior right cerebral hemisphere, similar as compared to previous MRI. Surrounding edema with regional mass effect without significant midline shift. Intraventricular extension with small volume hemorrhage within the right greater than left lateral ventricles. Stable ventricular size and morphology without hydrocephalus. No other new acute intracranial abnormality. 2D Echo EF 70-75% LDL 56 HgbA1c pending VTE prophylaxis - SCDs aspirin 81 mg daily prior to admission, now on No antithrombotic due to bleed Therapy recommendations:  pending  Palliative care to see patient today Disposition:  pending, daughter stated that pt would not want life without quality  Leukocytosis WBC 13.6 > 20.9. Afebrile Continue to monitor  Hypertension Home meds:  isosorbide mononitrate Stable now Off cleviprex for now BP goal 120-140 given unstable ICH Long-term BP goal  normotensive  Hyperlipidemia Home meds:  atorvastatin 80 mg, held on admission LDL 56, goal < 70 Hod off statin now Pt may be SATURN candidate but pending GOC discussion with palliative care service.   Dysphagia Did not pass swallow Now NPO On IVF @ 48 Speech on board  Other Stroke Risk Factors Advanced Age >/= 13  Coronary artery disease  Other Active Problems Depression, continue sertraline once able to have PO CKD 3A, creatinine 1.62 Mild thrombocytopenia platelet 137 Tachycardia  Hospital day # 1   ATTENDING NOTE: I reviewed above note and agree with the assessment and plan. Pt was seen and examined.   Daughter at the bedside. Pt lying in bed, eyes open, still lethargic with severe dysarthria and left hemiplegia. Asking to take off right hand mitten. Again discussed with daughter at the bedside, and she stated that pt does not want life without quality. Palliative care is on board, pending [place] discussion. Will hold off NG tube placement, or any heroic measures.   For detailed assessment and plan, please refer to above/below as I have made changes wherever appropriate.   Rosalin Hawking, MD PhD Stroke Neurology 06/07/2022 8:02 PM    To contact Stroke Continuity provider, please refer to [email or web address] After hours, contact General Neurology

## 2022-06-07 NOTE — Progress Notes (Incomplete)
Echocardiogram 2D Echocardiogram has been performed.  Ryan Humphrey 06/07/2022, 10:33 AM

## 2022-06-07 NOTE — Progress Notes (Signed)
SLP Cancellation Note  Patient Details Name: Ryan Humphrey MRN: [id number] DOB: [birth year]   Cancelled treatment:       Reason Eval/Treat Not Completed: Patient's level of consciousness. SLP will attempt next date.   Sonia Baller, MA, CCC-SLP Speech Therapy

## 2022-06-07 NOTE — Evaluation (Signed)
Physical Therapy Evaluation Patient Details Name: Ryan Humphrey MRN: [id number] DOB: [birth year] Today's Date: 06/07/2022  History of Present Illness  Pt is a 88yo male who fell out of bed at his ALF. CT revealed an intrparenchymal hematoma in R parietal region PMH: vascular dementia, hyperlipidemia   Clinical Impression  Pt admitted with above. Per daughter pt was residing in ALF and was able to dress and bath self and ambulated with a rollator. Pt was oriented to self, family, and surroundings at baseline but not to time of day or date. Pt now non-verbal with L UE rigidity, no command follow, and requires totalA to transfer to EOB. Pt unable to maintain EOB sitting balance without assist. Acute PT to cont to follow.      Recommendations for follow up therapy are one component of a multi-disciplinary discharge planning process, led by the attending physician.  Recommendations may be updated based on patient status, additional functional criteria and insurance authorization.  Follow Up Recommendations Skilled nursing-short term rehab (<3 hours/day) Can patient physically be transported by private vehicle: No    Assistance Recommended at Discharge Frequent or constant Supervision/Assistance  Patient can return home with the following       Equipment Recommendations  (TBD)  Recommendations for Other Services       Functional Status Assessment Patient has had a recent decline in their functional status and demonstrates the ability to make significant improvements in function in a reasonable and predictable amount of time.     Precautions / Restrictions Precautions Precautions: Fall Precaution Comments: R mitten Restrictions Weight Bearing Restrictions: No      Mobility  Bed Mobility Overal bed mobility: Needs Assistance Bed Mobility: Rolling, Supine to Sit, Sit to Supine Rolling: Total assist   Supine to sit: Total assist Sit to supine: Total assist   General bed mobility  comments: pt without initiation of task, HOB raised all the way up, helicopter transfer to EOB with total assist for trunk and LE management    Transfers                   General transfer comment: not appropriate this date    Ambulation/Gait               General Gait Details: unable this date  Stairs            Wheelchair Mobility    Modified Rankin (Stroke Patients Only) Modified Rankin (Stroke Patients Only) Pre-Morbid Rankin Score: Moderate disability Modified Rankin: Severe disability     Balance Overall balance assessment: Needs assistance Sitting-balance support: Feet supported, No upper extremity supported Sitting balance-Leahy Scale: Zero Sitting balance - Comments: pt dependent on support of PT to maintain EOB balance. Postural control: Posterior lean                                   Pertinent Vitals/Pain Pain Assessment Pain Assessment: Faces Facial Expression: Relaxed, neutral    Home Living Family/patient expects to be discharged to:: Assisted living                 Home Equipment: Rollator (4 wheels);Shower seat;Cane - single point      Prior Function Prior Level of Function : Needs assist             Mobility Comments: daughter reports he was able to dress and bath himself, ambulated with rollator t/o facility, to/from  dinning area, daughter suspects RN staff assisted with medication       Hand Dominance   Dominant Hand: Right    Extremity/Trunk Assessment   Upper Extremity Assessment Upper Extremity Assessment: LUE deficits/detail;RUE deficits/detail RUE Deficits / Details: pt reaching out above and below shoulder t/o entire session LUE Deficits / Details: pt with not active movement, pt very rigid with attempted PROM to L wrist, elbow, and shoulder    Lower Extremity Assessment Lower Extremity Assessment: Generalized weakness (pt wiht L LE tremors/jerky movements, dtr reports this to be  baseline, no active/purposeful movement)    Cervical / Trunk Assessment Cervical / Trunk Assessment: Kyphotic  Communication   Communication: Expressive difficulties  Cognition Arousal/Alertness: Lethargic Behavior During Therapy: Flat affect Overall Cognitive Status: Impaired/Different from baseline Area of Impairment: Following commands                       Following Commands: Follows one step commands inconsistently       General Comments: per RN pt was able to care for self but didn't keep up with the date, pt non-verbal with exception of saying "hi, nice to meet you" upon PT arrival. Pt with R gaze preference and head/neck in cervical extension        General Comments General comments (skin integrity, edema, etc.): pt with bruising t/o UEs, HR tachy up to 130s, noted pt in afib, RN aware and reports MD isn't ready to treat it yet    Exercises     Assessment/Plan    PT Assessment Patient needs continued PT services  PT Problem List Decreased strength;Decreased activity tolerance;Decreased balance;Decreased mobility;Decreased coordination;Decreased cognition;Decreased knowledge of use of DME;Decreased safety awareness;Decreased knowledge of precautions       PT Treatment Interventions DME instruction;Gait training;Functional mobility training;Therapeutic activities;Therapeutic exercise;Balance training;Neuromuscular re-education    PT Goals (Current goals can be found in the Care Plan section)  Acute Rehab PT Goals Patient Stated Goal: didn't state PT Goal Formulation: With patient Time For Goal Achievement: 06/21/22 Potential to Achieve Goals: Good    Frequency Min 3X/week     Co-evaluation               AM-PAC PT "6 Clicks" Mobility  Outcome Measure Help needed turning from your back to your side while in a flat bed without using bedrails?: Total Help needed moving from lying on your back to sitting on the side of a flat bed without using  bedrails?: Total Help needed moving to and from a bed to a chair (including a wheelchair)?: Total Help needed standing up from a chair using your arms (e.g., wheelchair or bedside chair)?: Total Help needed to walk in hospital room?: Total Help needed climbing 3-5 steps with a railing? : Total 6 Click Score: 6    End of Session Equipment Utilized During Treatment: Oxygen Activity Tolerance: Patient tolerated treatment well Patient left: in bed;with call bell/phone within reach;with bed alarm set;with nursing/sitter in room Nurse Communication: Mobility status PT Visit Diagnosis: Unsteadiness on feet (R26.81);Difficulty in walking, not elsewhere classified (R26.2);Dizziness and giddiness (R42)    Time: 6440-3474 PT Time Calculation (min) (ACUTE ONLY): 16 min   Charges:   PT Evaluation $PT Eval Moderate Complexity: 1 Mod          Kittie Plater, PT, DPT Acute Rehabilitation Services Secure chat preferred Office #: [phone]   Berline Lopes 06/07/2022, 1:14 PM

## 2022-06-07 NOTE — TOC Initial Note (Signed)
Transition of Care [facility]) - Initial/Assessment Note    Patient Details  Name: Ryan Humphrey MRN: [id number] Date of Birth: [birth year]  Transition of Care [facility]) CM/SW Contact:    Benard Halsted, LCSW Phone Number: 06/07/2022, 4:05 PM  Clinical Narrative:                 CSW received consult for hospice services at [place]. CSW left voicemail for patient's daughter, [name].  CSW spoke with Joellen Jersey at [facility]. She stated awareness of patient's need for hospice and that he would need to go to their SNF side as the ALF would not be able to handle his needs. She stated they normally contact hospice once patient arrives to their facility, but that CSW can contact them as well. They contract with [facility], [facility], and [facility] depending on family's preference.   Expected Discharge Plan: Skilled Nursing Facility Barriers to Discharge: Continued Medical Work up   Patient Goals and CMS Choice Patient states their goals for this hospitalization and ongoing recovery are:: [place] CMS Medicare.gov Compare Post Acute Care list provided to:: Patient Represenative (must comment) Choice offered to / list presented to : Adult [place] ownership interest in [facility].provided to:: Adult Children    Expected Discharge Plan and Services In-house Referral: Clinical Social Work   Post Acute Care Choice: Hospice, [place] Living arrangements for the past 2 months: [place]                                      Prior Living Arrangements/Services Living arrangements for the past 2 months: [place] Lives with:: Facility Resident Patient language and need for interpreter reviewed:: Yes Do you feel safe going back to the place where you live?: Yes      Need for Family Participation in Patient Care: Yes (Comment) Care giver support system in place?: Yes (comment)   Criminal Activity/Legal Involvement Pertinent to  Current Situation/Hospitalization: No - Comment as needed  Activities of Daily Living      Permission Sought/Granted Permission sought to share information with : Facility Sport and exercise psychologist, Family Supports Permission granted to share information with : No  Share Information with NAME: [name]  Permission granted to share info w AGENCY: [place] granted to share info w Relationship: Daughter  Permission granted to share info w Contact Information: [phone]  Emotional Assessment Appearance:: Appears stated age Attitude/Demeanor/Rapport: Unable to Assess Affect (typically observed): Unable to Assess Orientation: :  (responds to voice) Alcohol / Substance Use: Not Applicable Psych Involvement: No (comment)  Admission diagnosis:  ICH (intracerebral hemorrhage) ([place]) [I61.9] Renal insufficiency [N28.9] Normochromic normocytic anemia [D64.9] Scalp laceration, initial encounter [S01.01XA] Nontraumatic hemorrhage of right cerebral hemisphere [facility]) [I61.2] Patient Active Problem List   Diagnosis Date Noted   ICH (intracerebral hemorrhage) ([place]) 06/06/2022   Osteoarthritis, multiple sites 02/01/2022   AI (aortic insufficiency) 09/13/2021   Difficult or painful urination 08/06/2021   Slow transit constipation 08/06/2021   Right buttock pain 08/02/2021   Multiple rib fractures 05/24/2021   Elevated TSH 04/15/2021   Iron deficiency anemia 06/17/2020   Gait abnormality 04/22/2020   Frequent falls 03/18/2020   Depression, recurrent ([place]) 03/18/2020   NSTEMI (non-ST elevated myocardial infarction) ([place]) 03/12/2020   Alzheimer's dementia without behavioral disturbance ([place]) 09/30/2019   CKD (chronic kidney disease) stage 3, GFR 30-59 ml/min (  [place]) 08/17/2017   History of TIA (transient ischemic attack) 11/05/2016   Vitamin B12 deficiency 11/05/2016   GERD without esophagitis 11/05/2016   Hereditary and idiopathic peripheral neuropathy 08/20/2015   Benign  essential tremor 08/20/2015   Vitamin D deficiency 01/06/2015   Medication management 01/06/2015   Primary osteoarthritis of right knee 11/25/2014   Malignant neoplasm of prostate ([place]) 03/28/2011   INTERMITTENT VERTIGO 10/12/2009   Mixed hyperlipidemia 04/10/2009   Essential hypertension, benign 04/10/2009   CORONARY ATHEROSCLEROSIS NATIVE CORONARY ARTERY 04/10/2009   PCP:  Mast, Man X, NP [facility]acy:   [facility] [place] [facility] [place] [address] [place] [address] Phone: [phone] Fax: [phone]     Social Determinants of Health ([place]) Social History: SDOH Screenings   Depression [phone]): Low Risk  (02/26/2020)  Tobacco Use: Medium Risk (06/05/2022)   SDOH Interventions:     Readmission Risk Interventions     No data to display

## 2022-06-08 DIAGNOSIS — Z515 Encounter for palliative care: Secondary | ICD-10-CM | POA: Diagnosis not present

## 2022-06-08 DIAGNOSIS — I611 Nontraumatic intracerebral hemorrhage in hemisphere, cortical: Secondary | ICD-10-CM | POA: Diagnosis not present

## 2022-06-08 DIAGNOSIS — Z7189 Other specified counseling: Secondary | ICD-10-CM | POA: Diagnosis not present

## 2022-06-08 DIAGNOSIS — Z66 Do not resuscitate: Secondary | ICD-10-CM | POA: Diagnosis not present

## 2022-06-08 LAB — HEMOGLOBIN A1C
Hgb A1c MFr Bld: 5.5 % (ref 4.8–5.6)
Mean Plasma Glucose: 111 mg/dL

## 2022-06-08 NOTE — TOC Progression Note (Signed)
Transition of Care [facility]) - Progression Note    Patient Details  Name: Ryan Humphrey MRN: [id number] Date of Birth: 02-06-1929  [facility]) CM/SW [place], LCSW Phone Number: 06/08/2022, 10:21 AM  Clinical Narrative:    CSW contacted patient's daughters and they are in agreement with proceeding with [place] at [facility]. CSW sent referral to Hospice.    Expected Discharge Plan: [place] Barriers to Discharge: Continued Medical Work up  Expected Discharge Plan and Services In-house Referral: Clinical Social Work   Post Acute Care Choice: Hospice, [place] Living arrangements for the past 2 months: [place]                                       Social Determinants of Health (SDOH) Interventions SDOH Screenings   Depression (PHQ2-9): Low Risk  (02/26/2020)  Tobacco Use: Medium Risk (06/05/2022)    Readmission Risk Interventions     No data to display

## 2022-06-08 NOTE — Progress Notes (Signed)
Patient transferred to 2W - vitals stable, NAD. Daughters notified of transfer and given belongings back on arrival to 2W.

## 2022-06-08 NOTE — Progress Notes (Addendum)
STROKE TEAM PROGRESS NOTE   INTERVAL HISTORY Patient was seen by himself in room. Neuro exam was limited to observation due to patient being on comfort care. Transfer out of ICU orders initiated  Vitals:   06/08/22 0515 06/08/22 0615 06/08/22 0700 06/08/22 0800  BP: (!) 149/70 (!) 160/80    Pulse: (!) 115 97 (!) 104 (!) 112  Resp: (!) 25 (!) 24 (!) 23 (!) 23  Temp:      TempSrc:      SpO2: 90% 93% 94% 96%  Weight:      Height:       CBC:  Recent Labs  Lab 06/06/22 0010 06/07/22 0518  WBC 13.6* 20.9*  NEUTROABS 11.0*  --   HGB 11.2* 12.2*  HCT 33.8* 37.0*  MCV 98.0 97.1  PLT 137* 789    Basic Metabolic Panel:  Recent Labs  Lab 06/06/22 0010 06/07/22 0518  NA 142 144  K 4.1 3.9  CL 109 106  CO2 20* 28  GLUCOSE 117* 132*  BUN 30* 25*  CREATININE 1.62* 1.34*  CALCIUM 9.2 9.3     IMAGING past 24 hours ECHOCARDIOGRAM COMPLETE  Result Date: 06/07/2022    ECHOCARDIOGRAM REPORT   Patient Name:   ROBERTSON COLCLOUGH Date of Exam: 06/07/2022 Medical Rec #:  [id number]      Height:       72.0 in Accession #:    [id number]     Weight:       137.6 lb Date of Birth:  01-19-1929     BSA:          1.817 m Patient Age:    88 years       BP:           110/46 mmHg Patient Gender: M              HR:           117 bpm. Exam Location:  Inpatient Procedure: 2D Echo, Cardiac Doppler and Color Doppler Indications:    Stroke I63.9  History:        Patient has prior history of Echocardiogram examinations, most                 recent 03/12/2020. Previous Myocardial Infarction and CAD, TIA;                 Risk Factors:Hypertension and Dyslipidemia. CKD.  Sonographer:    Ronny Flurry Referring Phys: Merlín.Osler MCNEILL P [place]  1. Limited study as patient with altered mental status and combative.  2. Left ventricular ejection fraction, by estimation, is 70 to 75%. The left ventricle has hyperdynamic function. The left ventricle has no regional wall motion abnormalities. Left ventricular  diastolic parameters are indeterminate.  3. Right ventricular systolic function is normal. The right ventricular size is normal.  4. The mitral valve is abnormal. Trivial mitral valve regurgitation. No evidence of mitral stenosis.  5. The aortic valve is tricuspid. There is moderate calcification of the aortic valve. There is moderate thickening of the aortic valve. Aortic valve regurgitation is trivial. Aortic valve sclerosis is present, with no evidence of aortic valve stenosis.  6. The inferior vena cava is normal in size with greater than 50% respiratory variability, suggesting right atrial pressure of 3 mmHg. FINDINGS  Left Ventricle: Left ventricular ejection fraction, by estimation, is 70 to 75%. The left ventricle has hyperdynamic function. The left ventricle has no regional wall motion abnormalities. The left ventricular internal  cavity size was normal in size. There is no left ventricular hypertrophy. Left ventricular diastolic parameters are indeterminate. Right Ventricle: The right ventricular size is normal. No increase in right ventricular wall thickness. Right ventricular systolic function is normal. Left Atrium: Left atrial size was normal in size. Right Atrium: Right atrial size was normal in size. Pericardium: There is no evidence of pericardial effusion. Mitral Valve: The mitral valve is abnormal. There is mild thickening of the mitral valve leaflet(s). There is mild calcification of the mitral valve leaflet(s). Mild mitral annular calcification. Trivial mitral valve regurgitation. No evidence of mitral valve stenosis. Tricuspid Valve: The tricuspid valve is normal in structure. Tricuspid valve regurgitation is not demonstrated. No evidence of tricuspid stenosis. Aortic Valve: The aortic valve is tricuspid. There is moderate calcification of the aortic valve. There is moderate thickening of the aortic valve. Aortic valve regurgitation is trivial. Aortic valve sclerosis is present, with no evidence  of aortic valve  stenosis. Aortic valve mean gradient measures 5.0 mmHg. Aortic valve peak gradient measures 10.4 mmHg. Pulmonic Valve: The pulmonic valve was normal in structure. Pulmonic valve regurgitation is not visualized. No evidence of pulmonic stenosis. Aorta: The aortic root is normal in size and structure. Venous: The inferior vena cava is normal in size with greater than 50% respiratory variability, suggesting right atrial pressure of 3 mmHg. IAS/Shunts: No atrial level shunt detected by color flow Doppler. Additional Comments: Limited study as patient with altered mental status and combative.  RIGHT VENTRICLE RV S prime:     12.70 cm/s TAPSE (M-mode): 1.0 cm LEFT ATRIUM           Index        RIGHT ATRIUM           Index LA Vol (A2C): 26.6 ml 14.64 ml/m  RA Area:     11.70 cm LA Vol (A4C): 21.7 ml 11.94 ml/m  RA Volume:   26.90 ml  14.80 ml/m  AORTIC VALVE AV Vmax:           161.00 cm/s AV Vmean:          104.000 cm/s AV VTI:            0.255 m AV Peak Grad:      10.4 mmHg AV Mean Grad:      5.0 mmHg LVOT Vmax:         125.50 cm/s LVOT Vmean:        84.650 cm/s LVOT VTI:          0.214 m LVOT/AV VTI ratio: 0.84 MITRAL VALVE MV Area (PHT): 4.36 cm     SHUNTS MV Decel Time: 174 msec     Systemic VTI: 0.21 m MV E velocity: 104.00 cm/s MV A velocity: 148.00 cm/s MV E/A ratio:  0.70 Jenkins Rouge MD Electronically signed by Jenkins Rouge MD Signature Date/Time: 06/07/2022/10:51:05 AM    Final     PHYSICAL EXAM - limited exam due to comfort care measures.   General: in no acute distress HEENT: normocephalic and atraumatic Respiratory: normal respiratory effort and on RA  Mental Status: NAASIR CARREIRA is somnolent  Motor: Spontaneous, purposeful movement of RUE and RLE  Gait: not observed during encounter  ASSESSMENT/PLAN WLLIAM GROSSO is a 88 y.o. male with history of vascular dementia, hyperlipidemia who fell this evening.  He was getting out of bed and then just fell.  He is not sure why  he fell.  He is not certain of where  he is, or why he is in the hospital.  He lives in assisted living.   He was noted to be severely weak on the left side on arrival to the hospital and therefore taken to CT which revealed a sizable intraparenchymal hematoma in the right parietal region.  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 Moderate right parietal scalp hematoma and superficial scalp laceration.  MRI Interval enlargement of the right cerebral hematoma, now measuring nearly 6 x 7 cm. Small volume intraventricular blood clot layering in the left lateral ventricle which is non progressed. MRA nondiagnostic due to motion Repeat CT head showed 6.2 x 5.9 x 4.0 cm (estimated volume 73 mL) intraparenchymal hemorrhage at the posterior right cerebral hemisphere, similar as compared to previous MRI. Surrounding edema with regional mass effect without significant midline shift. Intraventricular extension with small volume hemorrhage within the right greater than left lateral ventricles. Stable ventricular size and morphology without hydrocephalus. No other new acute intracranial abnormality. 2D Echo EF 70-75% LDL 56 HgbA1c 5.5 VTE prophylaxis - was on SCDs, now off aspirin 81 mg daily prior to admission, now on No antithrombotic Disposition:  comfort care  Leukocytosis WBC 13.6 > 20.9 Afebrile  Hypertension Home meds:  isosorbide mononitrate BP meds held due to comfort care  Hyperlipidemia Home meds:  atorvastatin 80 mg  LDL 56, goal < 70 off statin now  Dysphagia Did not pass swallow NPO On IVF @ 28  Other Stroke Risk Factors Advanced Age >/= 62  Coronary artery disease  Other Active Problems Depression, continue sertraline once able to have PO CKD 3A, creatinine 1.62 Mild thrombocytopenia platelet  137 Tachycardia  Hospital day # 2    ATTENDING NOTE: I reviewed above note and agree with the assessment and plan. Pt was seen and examined.   Palliative care on board, daughter requested comfort care measure yesterday afternoon. Now on comfort care measures, eyes closed, not in discomfort, not open eyes or following commands. Still has spontaneous movement at right UE and LE.   For detailed assessment and plan, please refer to above/below as I have made changes wherever appropriate.   Rosalin Hawking, MD PhD Stroke Neurology 06/08/2022 10:21 PM     To contact Stroke Continuity provider, please refer to [email or web address] After hours, contact General Neurology

## 2022-06-08 NOTE — Plan of Care (Signed)
Patient transitioned to comfort care, care plan completed.

## 2022-06-08 NOTE — Progress Notes (Signed)
Daily Progress Note   Patient Name: STEPHANIE MCGLONE       Date: 06/08/2022 DOB: Jan 14, 1929  Age: 88 y.o. MRN#: [id number] Attending Physician: Stroke, Md, MD Primary Care Physician: Mast, Man X, NP Admit Date: 06/05/2022 Length of Stay: 2 days  Reason for Consultation/Follow-up: Establishing goals of care  HPI/Patient Profile:  88 y.o. male  with past medical history of vascular dementia, hyperlipidemia who fell this evening while getting out of bed. He was not certain of where he is, or why he is in the hospital.  He lives in assisted living at [facility].   He was noted to be severely weak on the left side on arrival to the hospital and therefore taken to CT which revealed a sizable intraparenchymal hematoma in the right parietal region. He was admitted on 06/05/2022 with right parietal ICH with small IVH with unclear etiology concerning for CAA, leukocytosis, hyperlipidemia, dysphagia, and others.   PMT was consulted for [place] conversations.  Subjective:   Subjective: Chart Reviewed. Updates received. Patient Assessed. Created space and opportunity for patient  and family to explore thoughts and feelings regarding current medical situation.  Today's Discussion: Today I saw the patient at the bedside.  His daughter was present.  The patient appears comfortable.  Respiratory status appears stable with regular respirations, no apnea.  He does not appear uncomfortable or agitated.  Daughter agrees he looks comfortable.  Discussed continued plans for comfort care while in the hospital, discharge to [facility] (where the patient [facility]) with hospice involved.  If when he is ready for discharge he is deemed stable for transfer then he would remain in-house for in-hospital death/comfort care.  Daughter is aware.  I provided emotional and general support through therapeutic listening, empathy, sharing of stories, therapeutic touch, and other techniques. I answered all questions and addressed all  concerns to the best of my ability.   Review of Systems  Unable to perform ROS: Acuity of condition    Objective:   Vital Signs:  BP (!) 160/80   Pulse (!) 113   Temp (!) 97.1 F (36.2 C) (Axillary)   Resp (!) 23   Ht 6' (1.829 m)   Wt 62.4 kg   SpO2 93%   BMI 18.66 kg/m   Physical Exam: Physical Exam Vitals and nursing note reviewed.  Constitutional:      General: He is sleeping. He is not in acute distress.    Appearance: He is ill-appearing.  HENT:     Head: Normocephalic and atraumatic.  Cardiovascular:     Rate and Rhythm: Rhythm irregular.  Pulmonary:     Effort: Pulmonary effort is normal. No respiratory distress.  Skin:    General: Skin is warm and dry.  Neurological:     Mental Status: He is unresponsive.     Palliative Assessment/Data: 10%    Existing Vynca/ACP Documentation: Advanced directive (dated 04/29/2010)   Assessment & Plan:   Impression: Present on Admission:  ICH (intracerebral hemorrhage) ([place])  88 year old male with chronic health issues and acute presentation as described above. At this point he is essentially suffered a devastating brain bleed. He is not expected to make much, if any, improvement. He is unable to eat or drink as he failed a swallow eval. He has severe left-sided deficit. He would likely require ongoing 24-hour nursing care indefinitely. Family is not very clear that this is not in his wishes. He has been clear over many years that he would not want a  quality of life such as this. Given this they have elected to transition to comfort care, consult hospice for home hosp[facility] at [facility].  Today on symptoms that he appeared comfortable with no acute distress and no agitation.  Long-term prognosis poor to grave.   SUMMARY OF RECOMMENDATIONS   Remain DNR Continued comfort care Work toward d/c to hospice in place at long-term care facility ([facility]) Continued emotional and spiritual  support of patient and family PMT will continue to follow for symptom management  Symptom Management:  Tylenol 650 mg PR every 4 hours as needed mild pain or fever Robinul 0.2 mg IV every 4 hours as needed excessive secretions Haldol 0.5 mg IV every 4 hours as needed agitation or delirium Ativan 1 mg IV every 4 hours as needed anxiety Morphine 1 mg IV every 2 hours as needed severe pain or dyspnea Zofran 4 mg IV every 6 hours as needed nausea Polyvinyl alcohol 1.4% ophthalmic 1 drop OU 4 times daily as needed  Code Status: DNR  Prognosis: < 2 weeks  Discharge Planning: [place] with Hospice  Discussed with: Patient's family, medical team, nursing team, [facility] team   Thank you for allowing [name] to participate in the care of [place] PMT will continue to support holistically.  Billing based on MDM: High  Problems Addressed: One acute or chronic illness or injury that poses a threat to life or bodily function  Amount and/or Complexity of Data: Category 3:Discussion of management or test interpretation with external physician/other qualified health care professional/appropriate source (not separately reported)  Risks: Parenteral controlled substances  Walden Field, NP Palliative Medicine Team  Team Phone # [phone] (Nights/Weekends)  02/09/2021, 8:17 AM

## 2022-06-08 NOTE — Progress Notes (Signed)
[employer]ufacturing [employer] [facility]) Hospital Liaison Note  Referral received for patient/family interest in Hospice at [place]. [facility] liaison spoke with patient's daughters [name] and Ginger to confirm interest. Interest confirmed.   Hospice eligibility pending.   Plan is to discharge via ambulance when ready.   No DME needs at this time.   Please send comfort medications/prescriptions with patient at discharge.   Please call with any questions or concerns. Thank you  Roselee Nova, [facility] Liaison [phone]

## 2022-06-09 DIAGNOSIS — Z515 Encounter for palliative care: Secondary | ICD-10-CM

## 2022-06-09 DIAGNOSIS — Z7189 Other specified counseling: Secondary | ICD-10-CM | POA: Diagnosis not present

## 2022-06-09 MED ORDER — HALOPERIDOL LACTATE 2 MG/ML PO CONC: 0.6000 mg | ORAL | 0 refills | Status: AC | PRN

## 2022-06-09 MED ORDER — MORPHINE SULFATE 20 MG/5ML PO SOLN: 3.0000 mg | ORAL | 0 refills | Status: AC | PRN

## 2022-06-09 MED ORDER — GLYCOPYRROLATE 1 MG PO TABS: 1.0000 mg | ORAL_TABLET | ORAL | 0 refills | Status: AC | PRN

## 2022-06-09 MED ORDER — ACETAMINOPHEN 325 MG PO TABS: 650.0000 mg | ORAL_TABLET | ORAL | 0 refills | Status: AC | PRN

## 2022-06-09 MED ORDER — LORAZEPAM 2 MG/ML PO CONC: 1.0000 mg | ORAL | 0 refills | Status: AC | PRN

## 2022-06-09 NOTE — Discharge Summary (Signed)
Stroke Discharge Summary  Patient ID: Ryan Humphrey   MRN: [id number]      DOB: 09-02-1928  Date of Admission: 06/05/2022 Date of Discharge: 06/09/2022  Attending Physician:  Stroke, Md, MD, Stroke MD Consultant(s):     palliative care   Patient's PCP:  Ryan Humphrey  DISCHARGE DIAGNOSIS:  Principal Problem:   ICH (intracerebral hemorrhage) - R parietal ICH with small IVH, etiology unclear, concerning for CAA   Active problemss HTN HLD Dysphagia Leukocytosis CAD CKD 3   Allergies as of 06/09/2022       Reactions   Niacin Itching   Penicillins Other (See Comments)   "rash"   Tramadol Itching        Medication List     STOP taking these medications    aspirin EC 81 MG tablet   atorvastatin 80 MG tablet Commonly known as: LIPITOR   cyanocobalamin 1000 MCG tablet Commonly known as: VITAMIN B12   gabapentin 100 MG capsule Commonly known as: NEURONTIN   Iron (Ferrous Sulfate) 325 (65 Fe) MG Tabs   isosorbide mononitrate 30 MG 24 hr tablet Commonly known as: IMDUR   lactose free nutrition Liqd   nitroGLYCERIN 0.4 MG SL tablet Commonly known as: NITROSTAT   pantoprazole 40 MG tablet Commonly known as: PROTONIX   polyethylene glycol powder 17 GM/SCOOP powder Commonly known as: GLYCOLAX/MIRALAX   senna 8.6 MG tablet Commonly known as: SENOKOT   sertraline 100 MG tablet Commonly known as: ZOLOFT   sertraline 25 MG tablet Commonly known as: ZOLOFT   Vitamin D 50 MCG (2000 UT) tablet       TAKE these medications    acetaminophen 325 MG tablet Commonly known as: TYLENOL Take 2 tablets (650 mg total) by mouth every 4 (four) hours as needed for mild pain (or temp > 37.5 C (99.5 F)). What changed:  medication strength how much to take when to take this reasons to take this   glycopyrrolate 1 MG tablet Commonly known as: ROBINUL Take 1 tablet (1 mg total) by mouth every 4 (four) hours as needed (excessive secretions).   haloperidol  2 MG/ML solution Commonly known as: HALDOL Place 0.3 mLs (0.6 mg total) under the tongue every 4 (four) hours as needed for agitation (or delirium).   LORazepam 2 MG/ML concentrated solution Commonly known as: ATIVAN Place 0.5 mLs (1 mg total) under the tongue every 4 (four) hours as needed for anxiety.   morphine 20 MG/5ML solution Take 0.8 mLs (3.2 mg total) by mouth every 2 (two) hours as needed for pain.        LABORATORY STUDIES CBC    Component Value Date/Time   WBC 20.9 (H) 06/07/2022 0518   RBC 3.81 (L) 06/07/2022 0518   HGB 12.2 (L) 06/07/2022 0518   HCT 37.0 (L) 06/07/2022 0518   PLT 175 06/07/2022 0518   MCV 97.1 06/07/2022 0518   MCH 32.0 06/07/2022 0518   MCHC 33.0 06/07/2022 0518   RDW 12.9 06/07/2022 0518   LYMPHSABS 1.5 06/06/2022 0010   MONOABS 0.7 06/06/2022 0010   EOSABS 0.2 06/06/2022 0010   BASOSABS 0.0 06/06/2022 0010   CMP    Component Value Date/Time   NA 144 06/07/2022 0518   NA 141 08/06/2021 0000   K 3.9 06/07/2022 0518   CL 106 06/07/2022 0518   CO2 28 06/07/2022 0518   GLUCOSE 132 (H) 06/07/2022 0518   BUN 25 (H) 06/07/2022 0518   BUN  32 (A) 08/06/2021 0000   CREATININE 1.34 (H) 06/07/2022 0518   CREATININE 1.84 (H) 02/26/2020 1620   CALCIUM 9.3 06/07/2022 0518   PROT 5.7 (L) 06/10/2021 2140   ALBUMIN 3.9 08/06/2021 0000   AST 18 08/06/2021 0000   ALT 14 08/06/2021 0000   ALKPHOS 150 (A) 08/06/2021 0000   BILITOT 0.6 06/07/2021 2140   GFRNONAA 50 (L) 06/07/2022 0518   GFRNONAA 32 (L) 02/26/2020 1620   GFRAA 44 11/18/2020 0000   GFRAA 37 (L) 02/26/2020 1620   COAGS Lab Results  Component Value Date   INR 1.1 06/06/2022   INR 1.0 06/01/2021   INR 1.0 09/30/2019   Lipid Panel    Component Value Date/Time   CHOL 111 06/07/2022 0518   TRIG 87 06/07/2022 0518   HDL 38 (L) 06/07/2022 0518   CHOLHDL 2.9 06/07/2022 0518   VLDL 17 06/07/2022 0518   LDLCALC 56 06/07/2022 0518   LDLCALC 104 (H) 02/26/2020 1620   HgbA1C  Lab  Results  Component Value Date   HGBA1C 5.5 06/07/2022   Urinalysis    Component Value Date/Time   COLORURINE YELLOW 09/30/2019 0727   APPEARANCEUR CLEAR 09/30/2019 0727   LABSPEC 1.015 09/30/2019 0727   PHURINE 5.0 09/30/2019 0727   GLUCOSEU NEGATIVE 09/30/2019 0727   HGBUR NEGATIVE 09/30/2019 0727   BILIRUBINUR NEGATIVE 09/30/2019 0727   KETONESUR NEGATIVE 09/30/2019 0727   PROTEINUR NEGATIVE 09/30/2019 0727   UROBILINOGEN 0.2 01/03/2015 1330   NITRITE NEGATIVE 09/30/2019 0727   LEUKOCYTESUR NEGATIVE 09/30/2019 0727   Urine Drug Screen No results found for: "LABOPIA", "COCAINSCRNUR", "LABBENZ", "AMPHETMU", "THCU", "LABBARB"  Alcohol Level    Component Value Date/Time   ETH <10 09/04/2018 2341     SIGNIFICANT DIAGNOSTIC STUDIES ECHOCARDIOGRAM COMPLETE  Result Date: 06/07/2022    ECHOCARDIOGRAM REPORT   Patient Name:   Ryan Humphrey Date of Exam: 06/07/2022 Medical Rec #:  [id number]      Height:       72.0 in Accession #:    [id number]     Weight:       137.6 lb Date of Birth:  [birth year]     BSA:          1.817 m Patient Age:    88 years       BP:           110/46 mmHg Patient Gender: M              HR:           117 bpm. Exam Location:  Inpatient Procedure: 2D Echo, Cardiac Doppler and Color Doppler Indications:    Stroke I63.9  History:        Patient has prior history of Echocardiogram examinations, most                 recent 03/12/2020. Previous Myocardial Infarction and CAD, TIA;                 Risk Factors:Hypertension and Dyslipidemia. CKD.  Sonographer:    Ryan Humphrey Referring Phys: Ryan Humphrey  1. Limited study as patient with altered mental status and combative.  2. Left ventricular ejection fraction, by estimation, is 70 to 75%. The left ventricle has hyperdynamic function. The left ventricle has no regional wall motion abnormalities. Left ventricular diastolic parameters are indeterminate.  3. Right ventricular systolic function is  normal. The right ventricular size is normal.  4. The mitral valve is abnormal.  Trivial mitral valve regurgitation. No evidence of mitral stenosis.  5. The aortic valve is tricuspid. There is moderate calcification of the aortic valve. There is moderate thickening of the aortic valve. Aortic valve regurgitation is trivial. Aortic valve sclerosis is present, with no evidence of aortic valve stenosis.  6. The inferior vena cava is normal in size with greater than 50% respiratory variability, suggesting right atrial pressure of 3 mmHg. FINDINGS  Left Ventricle: Left ventricular ejection fraction, by estimation, is 70 to 75%. The left ventricle has hyperdynamic function. The left ventricle has no regional wall motion abnormalities. The left ventricular internal cavity size was normal in size. There is no left ventricular hypertrophy. Left ventricular diastolic parameters are indeterminate. Right Ventricle: The right ventricular size is normal. No increase in right ventricular wall thickness. Right ventricular systolic function is normal. Left Atrium: Left atrial size was normal in size. Right Atrium: Right atrial size was normal in size. Pericardium: There is no evidence of pericardial effusion. Mitral Valve: The mitral valve is abnormal. There is mild thickening of the mitral valve leaflet(s). There is mild calcification of the mitral valve leaflet(s). Mild mitral annular calcification. Trivial mitral valve regurgitation. No evidence of mitral valve stenosis. Tricuspid Valve: The tricuspid valve is normal in structure. Tricuspid valve regurgitation is not demonstrated. No evidence of tricuspid stenosis. Aortic Valve: The aortic valve is tricuspid. There is moderate calcification of the aortic valve. There is moderate thickening of the aortic valve. Aortic valve regurgitation is trivial. Aortic valve sclerosis is present, with no evidence of aortic valve  stenosis. Aortic valve mean gradient measures 5.0 mmHg. Aortic  valve peak gradient measures 10.4 mmHg. Pulmonic Valve: The pulmonic valve was normal in structure. Pulmonic valve regurgitation is not visualized. No evidence of pulmonic stenosis. Aorta: The aortic root is normal in size and structure. Venous: The inferior vena cava is normal in size with greater than 50% respiratory variability, suggesting right atrial pressure of 3 mmHg. IAS/Shunts: No atrial level shunt detected by color flow Doppler. Additional Comments: Limited study as patient with altered mental status and combative.  RIGHT VENTRICLE RV S prime:     12.70 cm/s TAPSE (M-mode): 1.0 cm LEFT ATRIUM           Index        RIGHT ATRIUM           Index LA Vol (A2C): 26.6 ml 14.64 ml/m  RA Area:     11.70 cm LA Vol (A4C): 21.7 ml 11.94 ml/m  RA Volume:   26.90 ml  14.80 ml/m  AORTIC VALVE AV Vmax:           161.00 cm/s AV Vmean:          104.000 cm/s AV VTI:            0.255 m AV Peak Grad:      10.4 mmHg AV Mean Grad:      5.0 mmHg LVOT Vmax:         125.50 cm/s LVOT Vmean:        84.650 cm/s LVOT VTI:          0.214 m LVOT/AV VTI ratio: 0.84 MITRAL VALVE MV Area (PHT): 4.36 cm     SHUNTS MV Decel Time: 174 msec     Systemic VTI: 0.21 m MV E velocity: 104.00 cm/s MV A velocity: 148.00 cm/s MV E/A ratio:  0.70 Jenkins Rouge MD Electronically signed by Jenkins Rouge MD Signature Date/Time: 06/07/2022/10:51:05 AM  Final    CT HEAD WO CONTRAST (5MM)  Result Date: 06/07/2022 CLINICAL DATA:  Follow-up examination for hemorrhagic stroke. EXAM: CT HEAD WITHOUT CONTRAST TECHNIQUE: Contiguous axial images were obtained from the base of the skull through the vertex without intravenous contrast. RADIATION DOSE REDUCTION: This exam was performed according to the departmental dose-optimization program which includes automated exposure control, adjustment of the mA and/or kV according to patient size and/or use of iterative reconstruction technique. COMPARISON:  Prior CT from 06/05/2022 as well as MRI from earlier the  same day. FINDINGS: Brain: Examination degraded by motion artifact. Intraparenchymal hemorrhage positioned at the posterior right cerebral hemisphere measures 6.2 x 5.9 x 4.0 cm (estimated volume 73 mL). This is similar as compared to previous MRI, but enlarged from prior CT. Surrounding edema with regional mass effect and partial effacement of the right lateral ventricle. Intraventricular extension with small volume hemorrhage within the right greater than left lateral ventricles. Stable ventricular size and morphology without hydrocephalus. No significant midline shift. Basilar cisterns remain patent. No new hemorrhage elsewhere. No other large vessel territory infarct. No visible mass lesion or extra-axial fluid collection. Underlying atrophy with chronic small vessel ischemic disease noted. Vascular: No hyperdense vessel. Scattered vascular calcifications noted within the carotid siphons. Skull: Right occipital scalp contusion with skin staples in place. Calvarium grossly intact. Sinuses/Orbits: Globes orbital soft tissues demonstrate no acute finding. Paranasal sinuses remain largely clear. No significant mastoid effusion. Other: None. IMPRESSION: 1. 6.2 x 5.9 x 4.0 cm (estimated volume 73 mL) intraparenchymal hemorrhage at the posterior right cerebral hemisphere, similar as compared to previous MRI. Surrounding edema with regional mass effect without significant midline shift. Intraventricular extension with small volume hemorrhage within the right greater than left lateral ventricles. Stable ventricular size and morphology without hydrocephalus. 2. No other new acute intracranial abnormality. Electronically Signed   By: Jeannine Boga M.D.   On: 06/07/2022 03:31   MR BRAIN WO CONTRAST  Result Date: 06/06/2022 CLINICAL DATA:  Stroke workup EXAM: MRI HEAD WITHOUT CONTRAST MRA HEAD WITHOUT CONTRAST TECHNIQUE: Multiplanar, multi-echo pulse sequences of the brain and surrounding structures were acquired  without intravenous contrast. Angiographic images of the Circle of Willis were acquired using MRA technique without intravenous contrast. COMPARISON:  Head CT from yesterday FINDINGS: MRI HEAD FINDINGS Brain: Large acute hematoma in the posterior right cerebrum, up to 6.7 cm in length by 5.7 cm craniocaudal, previously 4.7 by 4.3 cm in these dimensions. There is a rim of edema with local mass effect but no herniation. No visible underlying acute infarct. Small volume intraventricular extension seen in the occipital horn of the left lateral ventricle. There is a background of brain atrophy and chronic small vessel ischemia with ventriculomegaly. No pre-existing hemorrhage seen on gradient sequence. Vascular: Major flow voids are preserved Skull and upper cervical spine: No focal marrow lesion Sinuses/Orbits: Negative Other: Very motion degraded MRA HEAD FINDINGS Very motion degraded, most images being nondiagnostic other than showing grossly patent carotid, vertebral, and proximal branch flow. Prelim sent to neurology teen in epic chat. IMPRESSION: 1. Interval enlargement of the right cerebral hematoma, now measuring nearly 6 x 7 cm. Small volume intraventricular blood clot layering in the left lateral ventricle which is non progressed. There is local mass effect without herniation. 2. No underlying infarct detected. 3. Very motion degraded study, essentially nondiagnostic MRA. Electronically Signed   By: Jorje Guild M.D.   On: 06/06/2022 11:51   MR ANGIO HEAD WO CONTRAST  Result Date: 06/06/2022 CLINICAL  DATA:  Stroke workup EXAM: MRI HEAD WITHOUT CONTRAST MRA HEAD WITHOUT CONTRAST TECHNIQUE: Multiplanar, multi-echo pulse sequences of the brain and surrounding structures were acquired without intravenous contrast. Angiographic images of the Circle of Willis were acquired using MRA technique without intravenous contrast. COMPARISON:  Head CT from yesterday FINDINGS: MRI HEAD FINDINGS Brain: Large acute  hematoma in the posterior right cerebrum, up to 6.7 cm in length by 5.7 cm craniocaudal, previously 4.7 by 4.3 cm in these dimensions. There is a rim of edema with local mass effect but no herniation. No visible underlying acute infarct. Small volume intraventricular extension seen in the occipital horn of the left lateral ventricle. There is a background of brain atrophy and chronic small vessel ischemia with ventriculomegaly. No pre-existing hemorrhage seen on gradient sequence. Vascular: Major flow voids are preserved Skull and upper cervical spine: No focal marrow lesion Sinuses/Orbits: Negative Other: Very motion degraded MRA HEAD FINDINGS Very motion degraded, most images being nondiagnostic other than showing grossly patent carotid, vertebral, and proximal branch flow. Prelim sent to neurology teen in epic chat. IMPRESSION: 1. Interval enlargement of the right cerebral hematoma, now measuring nearly 6 x 7 cm. Small volume intraventricular blood clot layering in the left lateral ventricle which is non progressed. There is local mass effect without herniation. 2. No underlying infarct detected. 3. Very motion degraded study, essentially nondiagnostic MRA. Electronically Signed   By: Jorje Guild M.D.   On: 06/06/2022 11:51   CT Head Wo Contrast  Result Date: 06/06/2022 CLINICAL DATA:  Head trauma, minor (Age >= 65y); Neck trauma (Age >= 65y). Fall, altered mental status EXAM: CT HEAD WITHOUT CONTRAST CT CERVICAL SPINE WITHOUT CONTRAST TECHNIQUE: Multidetector CT imaging of the head and cervical spine was performed following the standard protocol without intravenous contrast. Multiplanar CT image reconstructions of the cervical spine were also generated. RADIATION DOSE REDUCTION: This exam was performed according to the departmental dose-optimization program which includes automated exposure control, adjustment of the mA and/or kV according to patient size and/or use of iterative reconstruction  technique. COMPARISON:  None Available. FINDINGS: CT HEAD FINDINGS Brain: Acute intraparenchymal hematoma is seen within the right frontoparietal cortex measuring 3.0 x 3.1 x 4.9 cm (volume = 24 cm^3) on coronal image # 45 and sagittal image # 25 with a a moderate amount of surrounding cytotoxic edema. There is mild mass effect with effacement of the overlying sulci and adjacent atrium of the right lateral ventricle. The hemorrhage involves the pre and postcentral gyri of the right cerebral hemisphere. No midline shift. Mild parenchymal volume loss is commensurate with the patient's age. Mild periventricular white matter changes are present likely reflecting the sequela of small vessel ischemia. Ventricular size is commensurate with the degree of parenchymal volume loss. Cerebellum is unremarkable. Vascular: No hyperdense vessel or unexpected calcification. Skull: Normal. Negative for fracture or focal lesion. Sinuses/Orbits: No acute finding. Other: Mastoid air cells and middle ear cavities are clear. Moderate right parietal scalp hematoma and superficial scalp laceration noted. CT CERVICAL SPINE FINDINGS Alignment: 3 mm anterolisthesis C5-6 is likely degenerative in nature and is stable since prior examination. Otherwise normal cervical alignment. Skull base and vertebrae: Craniocervical alignment is normal. The atlantodental interval is not widened. No acute fracture of the cervical spine. Vertebral body height is preserved. Soft tissues and spinal canal: No prevertebral fluid or swelling. No visible canal hematoma. No canal hematoma. No prevertebral soft tissue swelling or paraspinal fluid collection identified. Moderate atherosclerotic calcification involving the left carotid bulb.  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 There is diffuse intervertebral disc space narrowing and endplate remodeling throughout the  cervical spine in keeping with changes of advanced degenerative disc disease. Prevertebral soft tissues are not thickened on sagittal reformats. Posterior disc osteophyte complex ease in combination with mild congenital narrowing of the spinal canal results in moderate central canal stenosis at C3-4 and C5-6. Milder, eccentric narrowing noted at C4-5 secondary to combination right paracentral disc osteophyte complex and asymmetric facet arthrosis. Severe multilevel neuroforaminal narrowing is present secondary to combination uncovertebral and facet arthrosis, most severe at C3-4 and C4-5, right greater than left, Upper chest: Negative. Other: None IMPRESSION: 1. Acute intraparenchymal hematoma within the right frontoparietal cortex with moderate surrounding cytotoxic edema and mild mass effect. No midline shift. Hematoma volume 24 cc. Hematoma involves the pre and postcentral gyri of the right cerebral cortex. 2. Moderate right parietal scalp hematoma and superficial scalp laceration. 3. No acute fracture or listhesis of the cervical spine. 4. Advanced degenerative disc and degenerative joint disease resulting in multilevel moderate to severe central canal stenosis and neuroforaminal narrowing, most severe at C3-4 and C4-5. Electronically Signed   By: Fidela Salisbury M.D.   On: 06/06/2022 00:17   CT Cervical Spine Wo Contrast  Result Date: 06/06/2022 CLINICAL DATA:  Head trauma, minor (Age >= 65y); Neck trauma (Age >= 65y). Fall, altered mental status EXAM: CT HEAD WITHOUT CONTRAST CT CERVICAL SPINE WITHOUT CONTRAST TECHNIQUE: Multidetector CT imaging of the head and cervical spine was performed following the standard protocol without intravenous contrast. Multiplanar CT image reconstructions of the cervical spine were also generated. RADIATION DOSE REDUCTION: This exam was performed according to the departmental dose-optimization program which includes automated exposure control, adjustment of the mA and/or kV  according to patient size and/or use of iterative reconstruction technique. COMPARISON:  None Available. FINDINGS: CT HEAD FINDINGS Brain: Acute intraparenchymal hematoma is seen within the right frontoparietal cortex measuring 3.0 x 3.1 x 4.9 cm (volume = 24 cm^3) on coronal image # 45 and sagittal image # 25 with a a moderate amount of surrounding cytotoxic edema. There is mild mass effect with effacement of the overlying sulci and adjacent atrium of the right lateral ventricle. The hemorrhage involves the pre and postcentral gyri of the right cerebral hemisphere. No midline shift. Mild parenchymal volume loss is commensurate with the patient's age. Mild periventricular white matter changes are present likely reflecting the sequela of small vessel ischemia. Ventricular size is commensurate with the degree of parenchymal volume loss. Cerebellum is unremarkable. Vascular: No hyperdense vessel or unexpected calcification. Skull: Normal. Negative for fracture or focal lesion. Sinuses/Orbits: No acute finding. Other: Mastoid air cells and middle ear cavities are clear. Moderate right parietal scalp hematoma and superficial scalp laceration noted. CT CERVICAL SPINE FINDINGS Alignment: 3 mm anterolisthesis C5-6 is likely degenerative in nature and is stable since prior examination. Otherwise normal cervical alignment. Skull base and vertebrae: Craniocervical alignment is normal. The atlantodental interval is not widened. No acute fracture of the cervical spine. Vertebral body height is preserved. Soft tissues and spinal canal: No prevertebral fluid or swelling. No visible canal hematoma. No canal hematoma. No prevertebral soft tissue swelling or paraspinal fluid collection identified. Moderate atherosclerotic calcification involving the left carotid bulb. 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 There is diffuse  intervertebral disc space  narrowing and endplate remodeling throughout the cervical spine in keeping with changes of advanced degenerative disc disease. Prevertebral soft tissues are not thickened on sagittal reformats. Posterior disc osteophyte complex ease in combination with mild congenital narrowing of the spinal canal results in moderate central canal stenosis at C3-4 and C5-6. Milder, eccentric narrowing noted at C4-5 secondary to combination right paracentral disc osteophyte complex and asymmetric facet arthrosis. Severe multilevel neuroforaminal narrowing is present secondary to combination uncovertebral and facet arthrosis, most severe at C3-4 and C4-5, right greater than left, Upper chest: Negative. Other: None IMPRESSION: 1. Acute intraparenchymal hematoma within the right frontoparietal cortex with moderate surrounding cytotoxic edema and mild mass effect. No midline shift. Hematoma volume 24 cc. Hematoma involves the pre and postcentral gyri of the right cerebral cortex. 2. Moderate right parietal scalp hematoma and superficial scalp laceration. 3. No acute fracture or listhesis of the cervical spine. 4. Advanced degenerative disc and degenerative joint disease resulting in multilevel moderate to severe central canal stenosis and neuroforaminal narrowing, most severe at C3-4 and C4-5. Electronically Signed   By: Fidela Salisbury M.D.   On: 06/06/2022 00:17      HISTORY OF PRESENT ILLNESS TRAVEN DAVIDS is a 88 y.o. male with history of vascular dementia, hyperlipidemia who fell this evening.  He was getting out of bed and then just fell.  He is not sure why he fell.  He is not certain of where he is, or why he is in the hospital.  He lives in assisted living.   He was noted to be severely weak on the left side on arrival to the hospital and therefore taken to CT which revealed a sizable intraparenchymal hematoma in the right parietal region.     LKW: Unclear tpa given?: No, ICH IR Thrombectomy?  No, ICH Modified Rankin Scale: 3-Moderate disability-requires help but walks WITHOUT assistance NIHSS: 17 ICH score: [place] is a 88 y.o. male with history of vascular dementia, hyperlipidemia who fell this evening.  He was getting out of bed and then just fell.  He is not sure why he fell.  He is not certain of where he is, or why he is in the hospital.  He lives in assisted living.   He was noted to be severely weak on the left side on arrival to the hospital and therefore taken to CT which revealed a sizable intraparenchymal hematoma in the right parietal region.   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 Moderate right parietal scalp hematoma and superficial scalp laceration.  MRI Interval enlargement of the right cerebral hematoma, now measuring nearly 6 x 7 cm. Small volume intraventricular blood clot layering in the left lateral ventricle which is non progressed. MRA nondiagnostic due to motion Repeat CT head showed 6.2 x 5.9 x 4.0 cm (estimated volume 73 mL) intraparenchymal hemorrhage at the posterior right cerebral hemisphere, similar as compared to previous MRI. Surrounding edema with regional mass effect without significant midline shift. Intraventricular extension with small volume hemorrhage within the right greater than left lateral ventricles. Stable ventricular size and morphology without hydrocephalus. No other new acute intracranial abnormality. 2D Echo EF 70-75% LDL 56 HgbA1c 5.5 VTE prophylaxis - was on SCDs, now off aspirin 81 mg daily prior to admission, now on No antithrombotic Disposition:  palliative care on  board, discussed [place] with family and family requested comfort care measures   Leukocytosis WBC 13.6 > 20.9 Afebrile   Hypertension Home meds:  isosorbide mononitrate BP meds  held due to comfort care   Hyperlipidemia Home meds:  atorvastatin 80 mg  LDL 56, goal < 70 off statin now   Dysphagia Did not pass swallow NPO   Other Stroke Risk Factors Advanced Age >/= 27  Coronary artery disease   Other Active Problems Depression, continue sertraline once able to have PO CKD 3A, creatinine 1.62 Mild thrombocytopenia platelet 137 Tachycardia   DISCHARGE EXAM Blood pressure (!) 160/80, pulse (!) 113, temperature (!) 97.1 F (36.2 C), temperature source Axillary, resp. rate (!) 23, height 6' (1.829 m), weight 62.4 kg, SpO2 93 %.  limited exam due to comfort care measures.  Pt lying in bed, unresponsive, not open eyes on voice, still has limited spontaneous movement of RUE and RLE. Not following commands   Discharge Diet       Diet   Diet NPO time specified Except for: Other (See Comments)   liquids  DISCHARGE PLAN Disposition:  home hospice Continue comfort care measures   25 minutes were spent preparing discharge.  Rosalin Hawking, MD PhD Stroke Neurology 06/09/2022 1:47 PM

## 2022-06-09 NOTE — Progress Notes (Addendum)
Report called to [facility], removed Pts IV. Daughters are at bedside and were updated. Awaiting transport to arrive.   Pt belongings sent with daughter [name]

## 2022-06-09 NOTE — NC FL2 (Signed)
[place] LEVEL OF CARE FORM     IDENTIFICATION  Patient Name: Ryan Humphrey Birthdate: 18-May-1929 Sex: male Admission Date (Current Location): 06/05/2022  [facility] and [place] Number:  Herbalist and Address:  [facility], [place] [address], [place], [place] [address]      Provider Number: [id number]  Attending Physician Name and Address:  Stroke, Md, MD  Relative Name and Phone Number:       Current Level of Care: Hospital Recommended Level of Care: [place] Prior Approval Number:    Date Approved/Denied:   PASRR Number: [id number]  Discharge Plan:      Current Diagnoses: Patient Active Problem List   Diagnosis Date Noted   ICH (intracerebral hemorrhage) ([place]) 06/06/2022   Osteoarthritis, multiple sites 02/01/2022   AI (aortic insufficiency) 09/13/2021   Difficult or painful urination 08/06/2021   Slow transit constipation 08/06/2021   Right buttock pain 08/02/2021   Multiple rib fractures 05/24/2021   Elevated TSH 04/15/2021   Iron deficiency anemia 06/17/2020   Gait abnormality 04/22/2020   Frequent falls 03/18/2020   Depression, recurrent ([place]) 03/18/2020   NSTEMI (non-ST elevated myocardial infarction) ([place]) 03/12/2020   Alzheimer's dementia without behavioral disturbance ([place]) 09/30/2019   CKD (chronic kidney disease) stage 3, GFR 30-59 ml/min ([place]) 08/17/2017   History of TIA (transient ischemic attack) 11/05/2016   Vitamin B12 deficiency 11/05/2016   GERD without esophagitis 11/05/2016   Hereditary and idiopathic peripheral neuropathy 08/20/2015   Benign essential tremor 08/20/2015   Vitamin D deficiency 01/06/2015   Medication management 01/06/2015   Primary osteoarthritis of right knee 11/25/2014   Malignant neoplasm of prostate ([place]) 03/28/2011   INTERMITTENT VERTIGO 10/12/2009   Mixed hyperlipidemia 04/10/2009   Essential hypertension, benign 04/10/2009   CORONARY ATHEROSCLEROSIS  NATIVE CORONARY ARTERY 04/10/2009    Orientation RESPIRATION BLADDER Height & Weight        Normal Incontinent, Indwelling catheter Weight: 137 lb 9.1 oz (62.4 kg) Height:  6' (182.9 cm)  BEHAVIORAL SYMPTOMS/MOOD NEUROLOGICAL BOWEL NUTRITION STATUS      Incontinent Diet  AMBULATORY STATUS COMMUNICATION OF NEEDS Skin   Extensive Assist Verbally Normal, Skin abrasions                       Personal Care Assistance Level of Assistance  Bathing, Feeding, Dressing, Total care Bathing Assistance: Maximum assistance Feeding assistance: Maximum assistance Dressing Assistance: Maximum assistance Total Care Assistance: Maximum assistance   Functional Limitations Info  Sight, Hearing, Speech Sight Info: Impaired Hearing Info: Impaired Speech Info: Impaired    SPECIAL CARE FACTORS FREQUENCY  PT (By licensed PT), OT (By licensed OT)     PT Frequency: 3x weekly OT Frequency: 3x weekly            Contractures Contractures Info: Not present    Additional Factors Info  Code Status, Allergies Code Status Info: DNR Allergies Info: Niacin  Penicillins  Tramadol           Current Medications (06/09/2022):  This is the current hospital active medication list Current Facility-Administered Medications  Medication Dose Route Frequency Provider Last Rate Last Admin   acetaminophen (TYLENOL) tablet 650 mg  650 mg Oral Q4H PRN Greta Doom, MD       Or   acetaminophen (TYLENOL) 160 MG/5ML solution 650 mg  650 mg Per Tube Q4H PRN Greta Doom, MD       Or  acetaminophen (TYLENOL) suppository 650 mg  650 mg Rectal Q4H PRN Greta Doom, MD   650 mg at 06/06/22 0324   acetaminophen (TYLENOL) tablet 650 mg  742 mg Oral Once Delora Fuel, MD       antiseptic oral rinse (BIOTENE) solution 15 mL  15 mL Topical PRN Carlis Stable, NP       glycopyrrolate (ROBINUL) tablet 1 mg  1 mg Oral Q4H PRN Carlis Stable, NP       Or   glycopyrrolate (ROBINUL) injection  0.2 mg  0.2 mg Subcutaneous Q4H PRN Carlis Stable, NP       Or   glycopyrrolate (ROBINUL) injection 0.2 mg  0.2 mg Intravenous Q4H PRN Carlis Stable, NP   0.2 mg at 06/09/22 5956   haloperidol (HALDOL) tablet 0.5 mg  0.5 mg Oral Q4H PRN Carlis Stable, NP       Or   haloperidol (HALDOL) 2 MG/ML solution 0.5 mg  0.5 mg Sublingual Q4H PRN Carlis Stable, NP       Or   haloperidol lactate (HALDOL) injection 0.5 mg  0.5 mg Intravenous Q4H PRN Carlis Stable, NP       LORazepam (ATIVAN) tablet 1 mg  1 mg Oral Q4H PRN Carlis Stable, NP       Or   LORazepam (ATIVAN) 2 MG/ML concentrated solution 1 mg  1 mg Sublingual Q4H PRN Carlis Stable, NP       Or   LORazepam (ATIVAN) injection 1 mg  1 mg Intravenous Q4H PRN Walden Field A, NP   1 mg at 06/09/22 0926   morphine (PF) 2 MG/ML injection 1 mg  1 mg Intravenous Q2H PRN Walden Field A, NP   1 mg at 06/09/22 1057   ondansetron (ZOFRAN-ODT) disintegrating tablet 4 mg  4 mg Oral Q6H PRN Carlis Stable, NP       Or   ondansetron (ZOFRAN) injection 4 mg  4 mg Intravenous Q6H PRN Walden Field A, NP       polyvinyl alcohol (LIQUIFILM TEARS) 1.4 % ophthalmic solution 1 drop  1 drop Both Eyes QID PRN Carlis Stable, NP         Discharge Medications: Please see discharge summary for a list of discharge medications.  Relevant Imaging Results:  Relevant Lab Results:   Additional Information SSN: [id number]  Archie Endo, LCSW

## 2022-06-09 NOTE — Care Management Important Message (Signed)
Important Message  Patient Details  Name: Ryan Humphrey MRN: [id number] Date of Birth: [birth year]   Medicare Important Message Given:  Yes     Orbie Pyo 06/09/2022, 2:39 PM

## 2022-06-09 NOTE — Progress Notes (Signed)
Pts daughters at bedside.

## 2022-06-09 NOTE — Progress Notes (Addendum)
2:35pm: Patient can be transferred to [facility] via [facility]. The number to call for report is [phone]. Patient will go to the [facility], room #3. RN to call PTAR and report when ready.  12:50pm: CSW spoke with Joellen Jersey at [facility] who states an FL2 is required prior to admission.  CSW will complete FL2 and will provide it to the facility for review.  Madilyn Fireman, MSW, LCSW Transitions of Care  Clinical Social Worker II [phone]

## 2022-06-09 NOTE — Progress Notes (Signed)
   Palliative Medicine Inpatient Follow Up Note HPI:  88 y.o. male  with past medical history of vascular dementia, hyperlipidemia who fell this evening while getting out of bed. He was not certain of where he is, or why he is in the hospital.  He lives in assisted living at [facility]   He was noted to be severely weak on the left side on arrival to the hospital and therefore taken to CT which revealed a sizable intraparenchymal hematoma in the right parietal region. He was admitted on 06/05/2022 with right parietal ICH with small IVH with unclear etiology concerning for CAA, leukocytosis, hyperlipidemia, dysphagia, and others.   PMT was consulted for [place] conversations.  Today's Discussion 06/09/2022  *Please note that this is a verbal dictation therefore any spelling or grammatical errors are due to the "[employer]" system interpretation.  Chart reviewed inclusive of vital signs, progress notes, laboratory results, and diagnostic images. Has not required any symptom medications in the last 12 hours.   I met at bedside with Ryan Humphrey. He was noted to be comfortable with shallow breathing at a regular cadence. Nail beds cool to the touch.   Plan for transition Hospice at [facility].   Questions and concerns addressed/Palliative Support Provided.   Objective Assessment: Vital Signs Vitals:   06/08/22 1100 06/08/22 1200  BP:    Pulse: (!) 109 (!) 113  Resp: (!) 22 (!) 23  Temp:    SpO2: 93% 93%    Intake/Output Summary (Last 24 hours) at 06/09/2022 3383 Last data filed at 06/08/2022 1900 Gross per 24 hour  Intake --  Output 825 ml  Net -825 ml   Last Weight  Most recent update: 06/06/2022  2:31 AM    Weight  62.4 kg (137 lb 9.1 oz)            Gen:  Frail elderly Caucasian M in NAD HEENT: dry mucous membranes CV: Irregular rate and rhythm  PULM:  On RA, breathing is even and nonlabored ABD: soft/nontender  EXT: No edema  Neuro: Somnolent  SUMMARY  OF RECOMMENDATIONS   Remain DNR Continued comfort care Work toward d/c to hospice in place at long-term care facility ([facility]) Continued emotional and spiritual support of patient and family PMT will continue to follow for symptom management  Billing based on MDM: High - Review of IV opiods ______________________________________________________________________________________ [place] Team Team Cell Phone: [phone] Please utilize secure chat with additional questions, if there is no response within 30 minutes please call the above phone number  Palliative Medicine Team providers are available by phone from 7am to 7pm daily and can be reached through the team cell phone.  Should this patient require assistance outside of these hours, please call the patient's attending physician.

## 2022-06-10 ENCOUNTER — Encounter: Payer: Self-pay | Admitting: Nurse Practitioner

## 2022-06-10 ENCOUNTER — Non-Acute Institutional Stay (SKILLED_NURSING_FACILITY): Payer: Medicare Other | Admitting: Nurse Practitioner

## 2022-06-10 DIAGNOSIS — I619 Nontraumatic intracerebral hemorrhage, unspecified: Secondary | ICD-10-CM | POA: Diagnosis not present

## 2022-06-10 DIAGNOSIS — S0634AD Traumatic hemorrhage of right cerebrum with loss of consciousness status unknown, subsequent encounter: Secondary | ICD-10-CM

## 2022-06-10 NOTE — Assessment & Plan Note (Addendum)
Hospitalized 06/05/22-06/09/22 for R parietal ICH with small IVH sustained from unwitnessed fall when he got out of bed. Noted left sided weakness.  He is discharged to [facility] for comfort care under Hospice service.   Prn Haldol, Robinul, Lorazepam, Morphine, Tylenol available to him.

## 2022-06-10 NOTE — Progress Notes (Addendum)
Location:  [facility] Room Number: NO/03/A Place of Service:  SNF (31) Provider:  Declyn Delsol X, NP  Patient Care Team: Ryan Dillavou X, NP as PCP - General (Internal Medicine) Ryan Poisson, MD as PCP - Cardiology (Cardiology)  Extended Emergency Contact Information Primary Emergency Contact: Ryan Humphrey Address: [address]          [place], [place] [address] [place] Home Phone: [phone] Mobile Phone: [phone] Relation: Daughter Secondary Emergency Contact: Ryan Humphrey, [place] of [place] Mobile Phone: [phone] Relation: Daughter  Code Status:  FULL Goals of care: Advanced Directive information    01/25/2022   10:57 AM  Advanced Directives  Does Patient Have a Medical Advance Directive? Yes  Type of Advance Directive Living will  Does patient want to make changes to medical advance directive? No - Patient declined     Chief Complaint  Patient presents with   Acute Visit    Patient is here for follow up after hospital stay     HPI:  Pt is a 88 y.o. male seen today for an acute visit for comfort care following hospital stay  Hospitalized 06/05/22-06/09/22 for R parietal ICH with small IVH sustained from unwitnessed fall when he got out of bed. Noted left sided weakness.  He is discharged to [facility] for comfort care under Hospice service.   Prn Haldol, Robinul, Lorazepam, Morphine, Tylenol available to him.        Past Medical History:  Diagnosis Date   Anemia    Arthritis    arthritis,osteopenia,"spinal stenosis"   Bladder neck obstruction 03/28/2011   CAD (coronary artery disease)    Cancer (HCC) 12-06-12   Prostate cancer'98   CKD (chronic kidney disease) stage 3, GFR 30-59 ml/min (HCC) 08/17/2017   Depression    Essential hypertension, benign 04/10/2009   GERD (gastroesophageal reflux disease)    GERD without esophagitis 11/05/2016   H/O hiatal hernia    H/O vertigo  12-06-12   none recent   Hyperlipidemia    IBS (irritable bowel syndrome)    Mixed hyperlipidemia 04/10/2009   Neuropathy    Osteopenia    Peripheral neuropathy 12-06-12   peripheral neuropathy   Rhinitis    RLS (restless legs syndrome)    Vascular dementia (HCC) 09/30/2019   Vitamin B12 deficiency    Past Surgical History:  Procedure Laterality Date   APPENDECTOMY     CARPAL TUNNEL RELEASE     both hands   cataract surgery Left 12-06-12   recent surgery   CHOLECYSTECTOMY     COLONOSCOPY WITH PROPOFOL N/A 12/25/2012   Procedure: COLONOSCOPY WITH PROPOFOL;  Surgeon: Ryan Bumpers, MD;  Location: [facility] ENDOSCOPY;  Service: Endoscopy;  Laterality: N/A;   CORONARY STENT INTERVENTION N/A 03/13/2020   Procedure: CORONARY STENT INTERVENTION;  Surgeon: Ryan Kendall, MD;  Location: [facility] INVASIVE CV LAB;  Service: Cardiovascular;  Laterality: N/A;   ESOPHAGOGASTRODUODENOSCOPY (EGD) WITH PROPOFOL N/A 12/25/2012   Procedure: ESOPHAGOGASTRODUODENOSCOPY (EGD) WITH PROPOFOL;  Surgeon: Ryan Bumpers, MD;  Location: [facility] ENDOSCOPY;  Service: Endoscopy;  Laterality: N/A;   INTRAVASCULAR ULTRASOUND/IVUS N/A 03/13/2020   Procedure: Intravascular Ultrasound/IVUS;  Surgeon: Ryan Kendall, MD;  Location: [facility] INVASIVE CV LAB;  Service: Cardiovascular;  Laterality: N/A;   LEFT HEART CATH AND CORONARY ANGIOGRAPHY N/A 03/13/2020   Procedure: LEFT HEART CATH AND CORONARY ANGIOGRAPHY;  Surgeon: Ryan Kendall, MD;  Location: [facility] INVASIVE CV  LAB;  Service: Cardiovascular;  Laterality: N/A;   PROSTATE SURGERY  12-06-12   TONSILLECTOMY     TOTAL KNEE ARTHROPLASTY Right 11/25/2014   Procedure: RIGHT TOTAL KNEE ARTHROPLASTY;  Surgeon: Ryan Corning, MD;  Location: [facility] OR;  Service: Orthopedics;  Laterality: Right;    Allergies  Allergen Reactions   Niacin Itching   Penicillins Other (See Comments)    "rash"    Tramadol Itching    Outpatient Encounter Medications as of 06/10/2022  Medication Sig    acetaminophen (TYLENOL) 325 MG tablet Take 2 tablets (650 mg total) by mouth every 4 (four) hours as needed for mild pain (or temp > 37.5 C (99.5 F)).   glycopyrrolate (ROBINUL) 1 MG tablet Take 1 tablet (1 mg total) by mouth every 4 (four) hours as needed (excessive secretions).   haloperidol (HALDOL) 2 MG/ML solution Place 0.3 mLs (0.6 mg total) under the tongue every 4 (four) hours as needed for agitation (or delirium).   LORazepam (ATIVAN) 2 MG/ML concentrated solution Place 0.5 mLs (1 mg total) under the tongue every 4 (four) hours as needed for anxiety.   morphine 20 MG/5ML solution Take 0.8 mLs (3.2 mg total) by mouth every 2 (two) hours as needed for pain.   No facility-administered encounter medications on file as of 06/10/2022.    Review of Systems  Unable to perform ROS: Patient unresponsive    Immunization History  Administered Date(s) Administered   DT (Pediatric) 07/23/2002   Fluad Quad(high Dose 65+) 04/04/2022   Influenza Split 02/11/2013, 04/13/2016, 03/13/2017   Influenza Whole 03/22/2002   Influenza, High Dose Seasonal PF 03/13/2014, 03/05/2015, 03/05/2018   Influenza-Unspecified 03/22/2002, 04/07/2004, 03/13/2005, 03/31/2006, 05/02/2007, 02/12/2008, 03/13/2009, 03/14/2011, 03/13/2012, 04/22/2016, 03/13/2017, 06/13/2018, 03/28/2019, 04/01/2021   Moderna Sars-Covid-2 Vaccination 06/17/2019, 07/15/2019, 04/21/2020   Pfizer Covid-19 Vaccine Bivalent Booster 55yrs & up 04/14/2022   Pneumococcal Conjugate-13 05/23/2017   Pneumococcal Polysaccharide-23 01/20/2016   Pneumococcal-Unspecified 03/22/2002   Tdap 06/21/2021   Unspecified SARS-COV-2 Vaccination 11/10/2020, 03/02/2021   Zoster Recombinat (Shingrix) 06/16/2021, 09/20/2021   Pertinent  Health Maintenance Due  Topic Date Due   INFLUENZA VACCINE  Completed      06/06/2022    9:00 PM 06/07/2022    4:00 PM 06/07/2022    7:59 PM 06/08/2022    8:00 AM 06/08/2022    9:00 PM  Fall Risk  Patient Fall Risk Level  High fall risk High fall risk High fall risk Low fall risk Low fall risk   Functional Status Survey:    Vitals:   06/10/22 0848  BP: (!) 138/58  Pulse: 72  Resp: (!) 26  Temp: 98.7 F (37.1 C)  SpO2: 95%  Height: 6' (1.829 m)   Body mass index is 18.66 kg/m. Physical Exam Vitals and nursing note reviewed.  Constitutional:      Comments: unresponsive  HENT:     Mouth/Throat:     Mouth: Mucous membranes are dry.  Cardiovascular:     Rate and Rhythm: Regular rhythm. Tachycardia present.     Heart sounds: No murmur heard. Pulmonary:     Effort: Pulmonary effort is normal.     Breath sounds: No wheezing.  Abdominal:     Palpations: Abdomen is soft.  Genitourinary:    Comments: C/o difficulty starting urine flow, painful urination.  Musculoskeletal:     Cervical back: Rigidity present.     Right lower leg: No edema.     Left lower leg: No edema.  Skin:    General: Skin  is warm and dry.  Neurological:     Motor: Weakness present.     Comments: Left sided weakness     Labs reviewed: Recent Labs    08/06/21 0000 06/06/22 0010 06/07/22 0518  NA 141 142 144  K 5.0 4.1 3.9  CL 105 109 106  CO2 27* 20* 28  GLUCOSE  --  117* 132*  BUN 32* 30* 25*  CREATININE 1.7* 1.62* 1.34*  CALCIUM 8.7 9.2 9.3   Recent Labs    07/23/21 0000 08/06/21 0000  AST 31 18  ALT 30 14  ALKPHOS 84 150*  ALBUMIN 4.3 3.9   Recent Labs    07/23/21 0000 07/29/21 0000 08/06/21 0000 06/06/22 0010 06/07/22 0518  WBC 9.5   < > 8.3 13.6* 20.9*  NEUTROABS 8,066.00  --   --  11.0*  --   HGB 10.6*   < > 9.4* 11.2* 12.2*  HCT 32*   < > 29* 33.8* 37.0*  MCV  --   --   --  98.0 97.1  PLT 108*   < > 146* 137* 175   < > = values in this interval not displayed.   Lab Results  Component Value Date   TSH 3.97 07/08/2021   Lab Results  Component Value Date   HGBA1C 5.5 06/07/2022   Lab Results  Component Value Date   CHOL 111 06/07/2022   HDL 38 (L) 06/07/2022   LDLCALC 56  06/07/2022   TRIG 87 06/07/2022   CHOLHDL 2.9 06/07/2022    Significant Diagnostic Results in last 30 days:  ECHOCARDIOGRAM COMPLETE  Result Date: 06/07/2022    ECHOCARDIOGRAM REPORT   Patient Name:   Ryan Humphrey Date of Exam: 06/07/2022 Medical Rec #:  [id number]      Height:       72.0 in Accession #:    [id number]     Weight:       137.6 lb Date of Birth:  [birth year]     BSA:          1.817 m Patient Age:    88 years       BP:           110/46 mmHg Patient Gender: M              HR:           117 bpm. Exam Location:  Inpatient Procedure: 2D Echo, Cardiac Doppler and Color Doppler Indications:    Stroke I63.9  History:        Patient has prior history of Echocardiogram examinations, most                 recent 03/12/2020. Previous Myocardial Infarction and CAD, TIA;                 Risk Factors:Hypertension and Dyslipidemia. CKD.  Sonographer:    Lucendia Herrlich Referring Phys: Tara.Kingdom MCNEILL P KIRKPATRICK IMPRESSIONS  1. Limited study as patient with altered mental status and combative.  2. Left ventricular ejection fraction, by estimation, is 70 to 75%. The left ventricle has hyperdynamic function. The left ventricle has no regional wall motion abnormalities. Left ventricular diastolic parameters are indeterminate.  3. Right ventricular systolic function is normal. The right ventricular size is normal.  4. The mitral valve is abnormal. Trivial mitral valve regurgitation. No evidence of mitral stenosis.  5. The aortic valve is tricuspid. There is moderate calcification of the aortic valve. There is moderate thickening of  the aortic valve. Aortic valve regurgitation is trivial. Aortic valve sclerosis is present, with no evidence of aortic valve stenosis.  6. The inferior vena cava is normal in size with greater than 50% respiratory variability, suggesting right atrial pressure of 3 mmHg. FINDINGS  Left Ventricle: Left ventricular ejection fraction, by estimation, is 70 to 75%. The left ventricle has  hyperdynamic function. The left ventricle has no regional wall motion abnormalities. The left ventricular internal cavity size was normal in size. There is no left ventricular hypertrophy. Left ventricular diastolic parameters are indeterminate. Right Ventricle: The right ventricular size is normal. No increase in right ventricular wall thickness. Right ventricular systolic function is normal. Left Atrium: Left atrial size was normal in size. Right Atrium: Right atrial size was normal in size. Pericardium: There is no evidence of pericardial effusion. Mitral Valve: The mitral valve is abnormal. There is mild thickening of the mitral valve leaflet(s). There is mild calcification of the mitral valve leaflet(s). Mild mitral annular calcification. Trivial mitral valve regurgitation. No evidence of mitral valve stenosis. Tricuspid Valve: The tricuspid valve is normal in structure. Tricuspid valve regurgitation is not demonstrated. No evidence of tricuspid stenosis. Aortic Valve: The aortic valve is tricuspid. There is moderate calcification of the aortic valve. There is moderate thickening of the aortic valve. Aortic valve regurgitation is trivial. Aortic valve sclerosis is present, with no evidence of aortic valve  stenosis. Aortic valve mean gradient measures 5.0 mmHg. Aortic valve peak gradient measures 10.4 mmHg. Pulmonic Valve: The pulmonic valve was normal in structure. Pulmonic valve regurgitation is not visualized. No evidence of pulmonic stenosis. Aorta: The aortic root is normal in size and structure. Venous: The inferior vena cava is normal in size with greater than 50% respiratory variability, suggesting right atrial pressure of 3 mmHg. IAS/Shunts: No atrial level shunt detected by color flow Doppler. Additional Comments: Limited study as patient with altered mental status and combative.  RIGHT VENTRICLE RV S prime:     12.70 cm/s TAPSE (M-mode): 1.0 cm LEFT ATRIUM           Index        RIGHT ATRIUM            Index LA Vol (A2C): 26.6 ml 14.64 ml/m  RA Area:     11.70 cm LA Vol (A4C): 21.7 ml 11.94 ml/m  RA Volume:   26.90 ml  14.80 ml/m  AORTIC VALVE AV Vmax:           161.00 cm/s AV Vmean:          104.000 cm/s AV VTI:            0.255 m AV Peak Grad:      10.4 mmHg AV Mean Grad:      5.0 mmHg LVOT Vmax:         125.50 cm/s LVOT Vmean:        84.650 cm/s LVOT VTI:          0.214 m LVOT/AV VTI ratio: 0.84 MITRAL VALVE MV Area (PHT): 4.36 cm     SHUNTS MV Decel Time: 174 msec     Systemic VTI: 0.21 m MV E velocity: 104.00 cm/s MV A velocity: 148.00 cm/s MV E/A ratio:  0.70 Charlton Haws MD Electronically signed by Charlton Haws MD Signature Date/Time: 06/07/2022/10:51:05 AM    Final    CT HEAD WO CONTRAST ( )  Result Date: 06/07/2022 CLINICAL DATA:  Follow-up examination for hemorrhagic stroke. EXAM: CT HEAD WITHOUT CONTRAST  TECHNIQUE: Contiguous axial images were obtained from the base of the skull through the vertex without intravenous contrast. RADIATION DOSE REDUCTION: This exam was performed according to the departmental dose-optimization program which includes automated exposure control, adjustment of the mA and/or kV according to patient size and/or use of iterative reconstruction technique. COMPARISON:  Prior CT from 06/05/2022 as well as MRI from earlier the same day. FINDINGS: Brain: Examination degraded by motion artifact. Intraparenchymal hemorrhage positioned at the posterior right cerebral hemisphere measures 6.2 Humphrey 5.9 Humphrey 4.0 cm (estimated volume 73 mL). This is similar as compared to previous MRI, but enlarged from prior CT. Surrounding edema with regional mass effect and partial effacement of the right lateral ventricle. Intraventricular extension with small volume hemorrhage within the right greater than left lateral ventricles. Stable ventricular size and morphology without hydrocephalus. No significant midline shift. Basilar cisterns remain patent. No new hemorrhage elsewhere. No other large  vessel territory infarct. No visible mass lesion or extra-axial fluid collection. Underlying atrophy with chronic small vessel ischemic disease noted. Vascular: No hyperdense vessel. Scattered vascular calcifications noted within the carotid siphons. Skull: Right occipital scalp contusion with skin staples in place. Calvarium grossly intact. Sinuses/Orbits: Globes orbital soft tissues demonstrate no acute finding. Paranasal sinuses remain largely clear. No significant mastoid effusion. Other: None. IMPRESSION: 1. 6.2 Humphrey 5.9 Humphrey 4.0 cm (estimated volume 73 mL) intraparenchymal hemorrhage at the posterior right cerebral hemisphere, similar as compared to previous MRI. Surrounding edema with regional mass effect without significant midline shift. Intraventricular extension with small volume hemorrhage within the right greater than left lateral ventricles. Stable ventricular size and morphology without hydrocephalus. 2. No other new acute intracranial abnormality. Electronically Signed   By: Rise Mu M.D.   On: 06/07/2022 03:31   MR BRAIN WO CONTRAST  Result Date: 06/06/2022 CLINICAL DATA:  Stroke workup EXAM: MRI HEAD WITHOUT CONTRAST MRA HEAD WITHOUT CONTRAST TECHNIQUE: Multiplanar, multi-echo pulse sequences of the brain and surrounding structures were acquired without intravenous contrast. Angiographic images of the Circle of Willis were acquired using MRA technique without intravenous contrast. COMPARISON:  Head CT from yesterday FINDINGS: MRI HEAD FINDINGS Brain: Large acute hematoma in the posterior right cerebrum, up to 6.7 cm in length by 5.7 cm craniocaudal, previously 4.7 by 4.3 cm in these dimensions. There is a rim of edema with local mass effect but no herniation. No visible underlying acute infarct. Small volume intraventricular extension seen in the occipital horn of the left lateral ventricle. There is a background of brain atrophy and chronic small vessel ischemia with ventriculomegaly.  No pre-existing hemorrhage seen on gradient sequence. Vascular: Major flow voids are preserved Skull and upper cervical spine: No focal marrow lesion Sinuses/Orbits: Negative Other: Very motion degraded MRA HEAD FINDINGS Very motion degraded, most images being nondiagnostic other than showing grossly patent carotid, vertebral, and proximal branch flow. Prelim sent to neurology teen in epic chat. IMPRESSION: 1. Interval enlargement of the right cerebral hematoma, now measuring nearly 6 Humphrey 7 cm. Small volume intraventricular blood clot layering in the left lateral ventricle which is non progressed. There is local mass effect without herniation. 2. No underlying infarct detected. 3. Very motion degraded study, essentially nondiagnostic MRA. Electronically Signed   By: Tiburcio Pea M.D.   On: 06/06/2022 11:51   MR ANGIO HEAD WO CONTRAST  Result Date: 06/06/2022 CLINICAL DATA:  Stroke workup EXAM: MRI HEAD WITHOUT CONTRAST MRA HEAD WITHOUT CONTRAST TECHNIQUE: Multiplanar, multi-echo pulse sequences of the brain and surrounding structures were acquired  without intravenous contrast. Angiographic images of the Circle of Willis were acquired using MRA technique without intravenous contrast. COMPARISON:  Head CT from yesterday FINDINGS: MRI HEAD FINDINGS Brain: Large acute hematoma in the posterior right cerebrum, up to 6.7 cm in length by 5.7 cm craniocaudal, previously 4.7 by 4.3 cm in these dimensions. There is a rim of edema with local mass effect but no herniation. No visible underlying acute infarct. Small volume intraventricular extension seen in the occipital horn of the left lateral ventricle. There is a background of brain atrophy and chronic small vessel ischemia with ventriculomegaly. No pre-existing hemorrhage seen on gradient sequence. Vascular: Major flow voids are preserved Skull and upper cervical spine: No focal marrow lesion Sinuses/Orbits: Negative Other: Very motion degraded MRA HEAD FINDINGS Very  motion degraded, most images being nondiagnostic other than showing grossly patent carotid, vertebral, and proximal branch flow. Prelim sent to neurology teen in epic chat. IMPRESSION: 1. Interval enlargement of the right cerebral hematoma, now measuring nearly 6 Humphrey 7 cm. Small volume intraventricular blood clot layering in the left lateral ventricle which is non progressed. There is local mass effect without herniation. 2. No underlying infarct detected. 3. Very motion degraded study, essentially nondiagnostic MRA. Electronically Signed   By: Tiburcio Pea M.D.   On: 06/06/2022 11:51   CT Head Wo Contrast  Result Date: 06/06/2022 CLINICAL DATA:  Head trauma, minor (Age >= 65y); Neck trauma (Age >= 65y). Fall, altered mental status EXAM: CT HEAD WITHOUT CONTRAST CT CERVICAL SPINE WITHOUT CONTRAST TECHNIQUE: Multidetector CT imaging of the head and cervical spine was performed following the standard protocol without intravenous contrast. Multiplanar CT image reconstructions of the cervical spine were also generated. RADIATION DOSE REDUCTION: This exam was performed according to the departmental dose-optimization program which includes automated exposure control, adjustment of the mA and/or kV according to patient size and/or use of iterative reconstruction technique. COMPARISON:  None Available. FINDINGS: CT HEAD FINDINGS Brain: Acute intraparenchymal hematoma is seen within the right frontoparietal cortex measuring 3.0 Humphrey 3.1 Humphrey 4.9 cm (volume = 24 cm^3) on coronal image # 45 and sagittal image # 25 with a a moderate amount of surrounding cytotoxic edema. There is mild mass effect with effacement of the overlying sulci and adjacent atrium of the right lateral ventricle. The hemorrhage involves the pre and postcentral gyri of the right cerebral hemisphere. No midline shift. Mild parenchymal volume loss is commensurate with the patient's age. Mild periventricular white matter changes are present likely reflecting  the sequela of small vessel ischemia. Ventricular size is commensurate with the degree of parenchymal volume loss. Cerebellum is unremarkable. Vascular: No hyperdense vessel or unexpected calcification. Skull: Normal. Negative for fracture or focal lesion. Sinuses/Orbits: No acute finding. Other: Mastoid air cells and middle ear cavities are clear. Moderate right parietal scalp hematoma and superficial scalp laceration noted. CT CERVICAL SPINE FINDINGS Alignment: 3 mm anterolisthesis C5-6 is likely degenerative in nature and is stable since prior examination. Otherwise normal cervical alignment. Skull base and vertebrae: Craniocervical alignment is normal. The atlantodental interval is not widened. No acute fracture of the cervical spine. Vertebral body height is preserved. Soft tissues and spinal canal: No prevertebral fluid or swelling. No visible canal hematoma. No canal hematoma. No prevertebral soft tissue swelling or paraspinal fluid collection identified. Moderate atherosclerotic calcification involving the left carotid bulb. 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 There is diffuse intervertebral disc space  narrowing and endplate remodeling throughout the cervical spine in keeping with changes of advanced degenerative disc disease. Prevertebral soft tissues are not thickened on sagittal reformats. Posterior disc osteophyte complex ease in combination with mild congenital narrowing of the spinal canal results in moderate central canal stenosis at C3-4 and C5-6. Milder, eccentric narrowing noted at C4-5 secondary to combination right paracentral disc osteophyte complex and asymmetric facet arthrosis. Severe multilevel neuroforaminal narrowing is present secondary to combination uncovertebral and facet arthrosis, most severe at C3-4 and C4-5, right greater than left, Upper chest: Negative. Other: None IMPRESSION: 1.  Acute intraparenchymal hematoma within the right frontoparietal cortex with moderate surrounding cytotoxic edema and mild mass effect. No midline shift. Hematoma volume 24 cc. Hematoma involves the pre and postcentral gyri of the right cerebral cortex. 2. Moderate right parietal scalp hematoma and superficial scalp laceration. 3. No acute fracture or listhesis of the cervical spine. 4. Advanced degenerative disc and degenerative joint disease resulting in multilevel moderate to severe central canal stenosis and neuroforaminal narrowing, most severe at C3-4 and C4-5. Electronically Signed   By: Helyn Numbers M.D.   On: 06/06/2022 00:17   CT Cervical Spine Wo Contrast  Result Date: 06/06/2022 CLINICAL DATA:  Head trauma, minor (Age >= 65y); Neck trauma (Age >= 65y). Fall, altered mental status EXAM: CT HEAD WITHOUT CONTRAST CT CERVICAL SPINE WITHOUT CONTRAST TECHNIQUE: Multidetector CT imaging of the head and cervical spine was performed following the standard protocol without intravenous contrast. Multiplanar CT image reconstructions of the cervical spine were also generated. RADIATION DOSE REDUCTION: This exam was performed according to the departmental dose-optimization program which includes automated exposure control, adjustment of the mA and/or kV according to patient size and/or use of iterative reconstruction technique. COMPARISON:  None Available. FINDINGS: CT HEAD FINDINGS Brain: Acute intraparenchymal hematoma is seen within the right frontoparietal cortex measuring 3.0 Humphrey 3.1 Humphrey 4.9 cm (volume = 24 cm^3) on coronal image # 45 and sagittal image # 25 with a a moderate amount of surrounding cytotoxic edema. There is mild mass effect with effacement of the overlying sulci and adjacent atrium of the right lateral ventricle. The hemorrhage involves the pre and postcentral gyri of the right cerebral hemisphere. No midline shift. Mild parenchymal volume loss is commensurate with the patient's age. Mild  periventricular white matter changes are present likely reflecting the sequela of small vessel ischemia. Ventricular size is commensurate with the degree of parenchymal volume loss. Cerebellum is unremarkable. Vascular: No hyperdense vessel or unexpected calcification. Skull: Normal. Negative for fracture or focal lesion. Sinuses/Orbits: No acute finding. Other: Mastoid air cells and middle ear cavities are clear. Moderate right parietal scalp hematoma and superficial scalp laceration noted. CT CERVICAL SPINE FINDINGS Alignment: 3 mm anterolisthesis C5-6 is likely degenerative in nature and is stable since prior examination. Otherwise normal cervical alignment. Skull base and vertebrae: Craniocervical alignment is normal. The atlantodental interval is not widened. No acute fracture of the cervical spine. Vertebral body height is preserved. Soft tissues and spinal canal: No prevertebral fluid or swelling. No visible canal hematoma. No canal hematoma. No prevertebral soft tissue swelling or paraspinal fluid collection identified. Moderate atherosclerotic calcification involving the left carotid bulb. 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 There is diffuse intervertebral disc space narrowing and endplate remodeling throughout the cervical spine in keeping with changes of advanced degenerative disc disease. Prevertebral soft tissues are not thickened on sagittal reformats. 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 Milder, eccentric narrowing noted at C4-5 secondary to combination right paracentral disc osteophyte complex and asymmetric facet arthrosis. Severe multilevel neuroforaminal narrowing is present secondary to combination uncovertebral and facet arthrosis, most severe at C3-4 and C4-5, right  greater than left, Upper chest: Negative. Other: None IMPRESSION: 1. Acute intraparenchymal hematoma within the right frontoparietal cortex with moderate surrounding cytotoxic edema and mild mass effect. No midline shift. Hematoma volume 24 cc. Hematoma involves the pre and postcentral gyri of the right cerebral cortex. 2. Moderate right parietal scalp hematoma and superficial scalp laceration. 3. No acute fracture or listhesis of the cervical spine. 4. Advanced degenerative disc and degenerative joint disease resulting in multilevel moderate to severe central canal stenosis and neuroforaminal narrowing, most severe at C3-4 and C4-5. Electronically Signed   By: Helyn Numbers M.D.   On: 06/06/2022 00:17    Assessment/Plan Nontraumatic intracerebral hemorrhage (HCC) Hospitalized 06/05/22-06/09/22 for R parietal ICH with small IVH sustained from unwitnessed fall when he got out of bed. Noted left sided weakness.  He is discharged to [facility] for comfort care under Hospice service.   Prn Haldol, Robinul, Lorazepam, Morphine, Tylenol available to him.      Family/ staff Communication: plan of care reviewed with the patient, the patient's daughter, and charge nurse.   Labs/tests ordered:  none  Time spend 35 minutes.

## 2022-06-13 DEATH — deceased
